# Patient Record
Sex: Male | Born: 1935 | Race: White | Hispanic: No | Marital: Married | Smoking: Former smoker
Health system: Southern US, Community
[De-identification: ages and names within clinical notes are randomized; demographics above are authoritative.]

## PROBLEM LIST (undated history)

## (undated) DIAGNOSIS — R569 Unspecified convulsions: Secondary | ICD-10-CM

## (undated) DIAGNOSIS — M5481 Occipital neuralgia: Secondary | ICD-10-CM

## (undated) DIAGNOSIS — I639 Cerebral infarction, unspecified: Secondary | ICD-10-CM

## (undated) DIAGNOSIS — M19012 Primary osteoarthritis, left shoulder: Secondary | ICD-10-CM

## (undated) DIAGNOSIS — R519 Headache, unspecified: Secondary | ICD-10-CM

## (undated) DIAGNOSIS — M1612 Unilateral primary osteoarthritis, left hip: Secondary | ICD-10-CM

## (undated) DIAGNOSIS — I499 Cardiac arrhythmia, unspecified: Secondary | ICD-10-CM

## (undated) DIAGNOSIS — Z9289 Personal history of other medical treatment: Secondary | ICD-10-CM

## (undated) DIAGNOSIS — I2510 Atherosclerotic heart disease of native coronary artery without angina pectoris: Secondary | ICD-10-CM

## (undated) DIAGNOSIS — D472 Monoclonal gammopathy: Secondary | ICD-10-CM

## (undated) DIAGNOSIS — G629 Polyneuropathy, unspecified: Secondary | ICD-10-CM

## (undated) DIAGNOSIS — Z9889 Other specified postprocedural states: Secondary | ICD-10-CM

## (undated) DIAGNOSIS — M4306 Spondylolysis, lumbar region: Secondary | ICD-10-CM

## (undated) DIAGNOSIS — C4490 Unspecified malignant neoplasm of skin, unspecified: Secondary | ICD-10-CM

## (undated) DIAGNOSIS — K8020 Calculus of gallbladder without cholecystitis without obstruction: Secondary | ICD-10-CM

## (undated) DIAGNOSIS — M1990 Unspecified osteoarthritis, unspecified site: Secondary | ICD-10-CM

## (undated) DIAGNOSIS — G5620 Lesion of ulnar nerve, unspecified upper limb: Secondary | ICD-10-CM

## (undated) DIAGNOSIS — R51 Headache: Secondary | ICD-10-CM

## (undated) DIAGNOSIS — K625 Hemorrhage of anus and rectum: Secondary | ICD-10-CM

## (undated) DIAGNOSIS — J189 Pneumonia, unspecified organism: Secondary | ICD-10-CM

## (undated) DIAGNOSIS — G4730 Sleep apnea, unspecified: Secondary | ICD-10-CM

## (undated) DIAGNOSIS — M48062 Spinal stenosis, lumbar region with neurogenic claudication: Secondary | ICD-10-CM

## (undated) DIAGNOSIS — G5622 Lesion of ulnar nerve, left upper limb: Secondary | ICD-10-CM

## (undated) DIAGNOSIS — M19011 Primary osteoarthritis, right shoulder: Secondary | ICD-10-CM

## (undated) DIAGNOSIS — I10 Essential (primary) hypertension: Secondary | ICD-10-CM

## (undated) DIAGNOSIS — G459 Transient cerebral ischemic attack, unspecified: Secondary | ICD-10-CM

## (undated) DIAGNOSIS — R112 Nausea with vomiting, unspecified: Secondary | ICD-10-CM

## (undated) HISTORY — PX: EYE SURGERY: SHX253

## (undated) HISTORY — PX: ULNAR NERVE TRANSPOSITION: SHX2595

## (undated) HISTORY — PX: TONSILLECTOMY AND ADENOIDECTOMY: SUR1326

## (undated) HISTORY — DX: Lesion of ulnar nerve, left upper limb: G56.22

## (undated) HISTORY — DX: Spondylolysis, lumbar region: M43.06

## (undated) HISTORY — DX: Personal history of other medical treatment: Z92.89

## (undated) HISTORY — DX: Occipital neuralgia: M54.81

## (undated) HISTORY — DX: Polyneuropathy, unspecified: G62.9

## (undated) HISTORY — PX: BACK SURGERY: SHX140

## (undated) HISTORY — DX: Lesion of ulnar nerve, unspecified upper limb: G56.20

## (undated) HISTORY — PX: JOINT REPLACEMENT: SHX530

## (undated) HISTORY — PX: OTHER SURGICAL HISTORY: SHX169

## (undated) HISTORY — PX: CATARACT EXTRACTION: SUR2

## (undated) HISTORY — PX: TOE SURGERY: SHX1073

## (undated) HISTORY — DX: Monoclonal gammopathy: D47.2

## (undated) HISTORY — DX: Calculus of gallbladder without cholecystitis without obstruction: K80.20

## (undated) HISTORY — DX: Hemorrhage of anus and rectum: K62.5

---

## 1976-10-29 HISTORY — PX: GREAT TOE ARTHRODESIS, INTERPHALANGEAL JOINT: SUR55

## 1993-07-29 HISTORY — PX: SKIN CANCER EXCISION: SHX779

## 1995-10-30 DIAGNOSIS — Z9289 Personal history of other medical treatment: Secondary | ICD-10-CM

## 1995-10-30 HISTORY — DX: Personal history of other medical treatment: Z92.89

## 1996-07-29 DIAGNOSIS — K8020 Calculus of gallbladder without cholecystitis without obstruction: Secondary | ICD-10-CM

## 1996-07-29 HISTORY — DX: Calculus of gallbladder without cholecystitis without obstruction: K80.20

## 1998-05-29 HISTORY — PX: SUPERFICIAL KERATECTOMY: SHX2459

## 1999-11-28 ENCOUNTER — Encounter: Admission: RE | Admit: 1999-11-28 | Discharge: 1999-11-28 | Payer: Self-pay | Admitting: Sports Medicine

## 1999-12-28 HISTORY — PX: OTHER SURGICAL HISTORY: SHX169

## 2000-11-05 ENCOUNTER — Encounter: Admission: RE | Admit: 2000-11-05 | Discharge: 2000-11-05 | Payer: Self-pay | Admitting: Family Medicine

## 2000-11-21 ENCOUNTER — Encounter: Admission: RE | Admit: 2000-11-21 | Discharge: 2000-11-21 | Payer: Self-pay | Admitting: Family Medicine

## 2000-12-24 ENCOUNTER — Encounter: Admission: RE | Admit: 2000-12-24 | Discharge: 2000-12-24 | Payer: Self-pay | Admitting: Family Medicine

## 2001-06-24 ENCOUNTER — Encounter: Admission: RE | Admit: 2001-06-24 | Discharge: 2001-06-24 | Payer: Self-pay | Admitting: Family Medicine

## 2001-07-01 ENCOUNTER — Encounter: Admission: RE | Admit: 2001-07-01 | Discharge: 2001-07-01 | Payer: Self-pay | Admitting: Family Medicine

## 2001-12-30 ENCOUNTER — Encounter: Admission: RE | Admit: 2001-12-30 | Discharge: 2001-12-30 | Payer: Self-pay | Admitting: Family Medicine

## 2002-01-22 ENCOUNTER — Encounter: Admission: RE | Admit: 2002-01-22 | Discharge: 2002-01-22 | Payer: Self-pay | Admitting: Family Medicine

## 2002-05-20 ENCOUNTER — Ambulatory Visit (HOSPITAL_COMMUNITY): Admission: RE | Admit: 2002-05-20 | Discharge: 2002-05-20 | Payer: Self-pay | Admitting: Family Medicine

## 2002-05-20 ENCOUNTER — Encounter: Admission: RE | Admit: 2002-05-20 | Discharge: 2002-05-20 | Payer: Self-pay | Admitting: Family Medicine

## 2002-05-29 DIAGNOSIS — Z9289 Personal history of other medical treatment: Secondary | ICD-10-CM

## 2002-05-29 HISTORY — DX: Personal history of other medical treatment: Z92.89

## 2002-06-16 ENCOUNTER — Ambulatory Visit (HOSPITAL_COMMUNITY): Admission: RE | Admit: 2002-06-16 | Discharge: 2002-06-16 | Payer: Self-pay | Admitting: Sports Medicine

## 2002-07-07 ENCOUNTER — Encounter: Admission: RE | Admit: 2002-07-07 | Discharge: 2002-07-07 | Payer: Self-pay | Admitting: Family Medicine

## 2002-07-11 ENCOUNTER — Encounter: Payer: Self-pay | Admitting: Family Medicine

## 2002-07-11 ENCOUNTER — Encounter: Admission: RE | Admit: 2002-07-11 | Discharge: 2002-07-11 | Payer: Self-pay | Admitting: Family Medicine

## 2002-07-15 DIAGNOSIS — Z9289 Personal history of other medical treatment: Secondary | ICD-10-CM

## 2002-07-15 HISTORY — DX: Personal history of other medical treatment: Z92.89

## 2002-12-28 DIAGNOSIS — K625 Hemorrhage of anus and rectum: Secondary | ICD-10-CM

## 2002-12-28 HISTORY — DX: Hemorrhage of anus and rectum: K62.5

## 2003-01-26 ENCOUNTER — Encounter: Admission: RE | Admit: 2003-01-26 | Discharge: 2003-01-26 | Payer: Self-pay | Admitting: Family Medicine

## 2003-01-27 ENCOUNTER — Encounter: Admission: RE | Admit: 2003-01-27 | Discharge: 2003-01-27 | Payer: Self-pay | Admitting: Family Medicine

## 2003-04-15 ENCOUNTER — Ambulatory Visit (HOSPITAL_COMMUNITY): Admission: RE | Admit: 2003-04-15 | Discharge: 2003-04-15 | Payer: Self-pay | Admitting: Gastroenterology

## 2003-04-15 ENCOUNTER — Encounter (INDEPENDENT_AMBULATORY_CARE_PROVIDER_SITE_OTHER): Payer: Self-pay | Admitting: *Deleted

## 2003-04-19 HISTORY — PX: COLONOSCOPY W/ POLYPECTOMY: SHX1380

## 2004-03-14 ENCOUNTER — Encounter: Admission: RE | Admit: 2004-03-14 | Discharge: 2004-03-14 | Payer: Self-pay | Admitting: Family Medicine

## 2004-03-15 ENCOUNTER — Encounter: Admission: RE | Admit: 2004-03-15 | Discharge: 2004-03-15 | Payer: Self-pay | Admitting: Family Medicine

## 2004-04-05 ENCOUNTER — Encounter: Admission: RE | Admit: 2004-04-05 | Discharge: 2004-04-05 | Payer: Self-pay | Admitting: Family Medicine

## 2004-04-11 ENCOUNTER — Encounter: Admission: RE | Admit: 2004-04-11 | Discharge: 2004-04-11 | Payer: Self-pay | Admitting: Family Medicine

## 2004-07-17 ENCOUNTER — Emergency Department (HOSPITAL_COMMUNITY): Admission: EM | Admit: 2004-07-17 | Discharge: 2004-07-17 | Payer: Self-pay | Admitting: Family Medicine

## 2005-04-17 ENCOUNTER — Encounter: Admission: RE | Admit: 2005-04-17 | Discharge: 2005-04-17 | Payer: Self-pay | Admitting: Family Medicine

## 2005-04-17 ENCOUNTER — Ambulatory Visit: Payer: Self-pay | Admitting: Family Medicine

## 2005-05-08 ENCOUNTER — Ambulatory Visit: Payer: Self-pay | Admitting: Family Medicine

## 2005-05-22 ENCOUNTER — Ambulatory Visit: Payer: Self-pay | Admitting: Family Medicine

## 2005-07-03 ENCOUNTER — Ambulatory Visit: Payer: Self-pay | Admitting: Family Medicine

## 2006-01-03 ENCOUNTER — Emergency Department (HOSPITAL_COMMUNITY): Admission: EM | Admit: 2006-01-03 | Discharge: 2006-01-03 | Payer: Self-pay | Admitting: Family Medicine

## 2006-05-07 ENCOUNTER — Ambulatory Visit: Payer: Self-pay | Admitting: Family Medicine

## 2006-12-26 DIAGNOSIS — G609 Hereditary and idiopathic neuropathy, unspecified: Secondary | ICD-10-CM

## 2006-12-26 DIAGNOSIS — E785 Hyperlipidemia, unspecified: Secondary | ICD-10-CM | POA: Insufficient documentation

## 2006-12-26 DIAGNOSIS — F528 Other sexual dysfunction not due to a substance or known physiological condition: Secondary | ICD-10-CM | POA: Insufficient documentation

## 2006-12-26 DIAGNOSIS — G629 Polyneuropathy, unspecified: Secondary | ICD-10-CM | POA: Insufficient documentation

## 2006-12-26 DIAGNOSIS — M5382 Other specified dorsopathies, cervical region: Secondary | ICD-10-CM | POA: Insufficient documentation

## 2006-12-26 DIAGNOSIS — M1990 Unspecified osteoarthritis, unspecified site: Secondary | ICD-10-CM | POA: Insufficient documentation

## 2006-12-26 DIAGNOSIS — E669 Obesity, unspecified: Secondary | ICD-10-CM | POA: Insufficient documentation

## 2006-12-26 DIAGNOSIS — I10 Essential (primary) hypertension: Secondary | ICD-10-CM | POA: Insufficient documentation

## 2006-12-26 DIAGNOSIS — H9190 Unspecified hearing loss, unspecified ear: Secondary | ICD-10-CM | POA: Insufficient documentation

## 2007-04-21 ENCOUNTER — Encounter: Payer: Self-pay | Admitting: Family Medicine

## 2007-04-22 ENCOUNTER — Ambulatory Visit: Payer: Self-pay | Admitting: Family Medicine

## 2007-04-22 DIAGNOSIS — M546 Pain in thoracic spine: Secondary | ICD-10-CM | POA: Insufficient documentation

## 2007-04-22 DIAGNOSIS — M549 Dorsalgia, unspecified: Secondary | ICD-10-CM

## 2007-04-22 LAB — CONVERTED CEMR LAB
AST: 38 units/L — ABNORMAL HIGH (ref 0–37)
Albumin: 4.7 g/dL (ref 3.5–5.2)
Alkaline Phosphatase: 51 units/L (ref 39–117)
CO2: 27 meq/L (ref 19–32)
Calcium: 9.5 mg/dL (ref 8.4–10.5)
Creatinine, Ser: 0.89 mg/dL (ref 0.40–1.50)
Direct LDL: 123 mg/dL — ABNORMAL HIGH
Glucose, Urine, Semiquant: NEGATIVE
Ketones, urine, test strip: NEGATIVE
Nitrite: NEGATIVE
PSA: 0.33 ng/mL (ref 0.10–4.00)
Protein, U semiquant: NEGATIVE
Sodium: 140 meq/L (ref 135–145)
Specific Gravity, Urine: 1.02
Urobilinogen, UA: 0.2

## 2007-04-23 ENCOUNTER — Encounter: Payer: Self-pay | Admitting: Family Medicine

## 2007-04-24 ENCOUNTER — Encounter: Payer: Self-pay | Admitting: Family Medicine

## 2007-04-29 ENCOUNTER — Telehealth: Payer: Self-pay | Admitting: *Deleted

## 2007-04-30 ENCOUNTER — Ambulatory Visit: Payer: Self-pay | Admitting: Family Medicine

## 2007-04-30 ENCOUNTER — Telehealth (INDEPENDENT_AMBULATORY_CARE_PROVIDER_SITE_OTHER): Payer: Self-pay | Admitting: *Deleted

## 2007-11-11 ENCOUNTER — Telehealth: Payer: Self-pay | Admitting: *Deleted

## 2007-11-13 ENCOUNTER — Ambulatory Visit: Payer: Self-pay | Admitting: Family Medicine

## 2007-11-14 ENCOUNTER — Encounter: Payer: Self-pay | Admitting: Family Medicine

## 2007-11-14 DIAGNOSIS — E291 Testicular hypofunction: Secondary | ICD-10-CM | POA: Insufficient documentation

## 2007-11-14 LAB — CONVERTED CEMR LAB
ALT: 46 units/L (ref 0–53)
Albumin: 4.8 g/dL (ref 3.5–5.2)
BUN: 14 mg/dL (ref 6–23)
CO2: 27 meq/L (ref 19–32)
Calcium: 9.5 mg/dL (ref 8.4–10.5)
Chloride: 103 meq/L (ref 96–112)
Glucose, Bld: 99 mg/dL (ref 70–99)
HCT: 45.9 % (ref 39.0–52.0)
MCV: 99.1 fL (ref 78.0–100.0)
PSA: 0.42 ng/mL (ref 0.10–4.00)
RDW: 13.7 % (ref 11.5–15.5)
Testosterone: 208.1 ng/dL — ABNORMAL LOW (ref 350–890)
Total Bilirubin: 0.6 mg/dL (ref 0.3–1.2)
WBC: 6 10*3/uL (ref 4.0–10.5)

## 2007-11-19 ENCOUNTER — Telehealth: Payer: Self-pay | Admitting: *Deleted

## 2007-12-02 ENCOUNTER — Telehealth: Payer: Self-pay | Admitting: *Deleted

## 2007-12-03 ENCOUNTER — Ambulatory Visit: Payer: Self-pay | Admitting: Family Medicine

## 2007-12-03 DIAGNOSIS — H1045 Other chronic allergic conjunctivitis: Secondary | ICD-10-CM | POA: Insufficient documentation

## 2008-01-02 ENCOUNTER — Encounter: Payer: Self-pay | Admitting: Family Medicine

## 2008-01-02 ENCOUNTER — Ambulatory Visit: Payer: Self-pay | Admitting: Family Medicine

## 2008-01-02 ENCOUNTER — Telehealth: Payer: Self-pay | Admitting: *Deleted

## 2008-05-04 ENCOUNTER — Ambulatory Visit: Payer: Self-pay | Admitting: Family Medicine

## 2008-05-04 LAB — CONVERTED CEMR LAB
CO2: 25 meq/L (ref 19–32)
Cholesterol, target level: 200 mg/dL
Creatinine, Ser: 0.9 mg/dL (ref 0.40–1.50)
Glucose, Bld: 107 mg/dL — ABNORMAL HIGH (ref 70–99)
HDL goal, serum: 40 mg/dL
Sodium: 140 meq/L (ref 135–145)

## 2008-05-06 ENCOUNTER — Encounter: Payer: Self-pay | Admitting: Family Medicine

## 2008-06-07 ENCOUNTER — Telehealth: Payer: Self-pay | Admitting: Family Medicine

## 2008-06-10 ENCOUNTER — Encounter: Payer: Self-pay | Admitting: Family Medicine

## 2008-08-06 ENCOUNTER — Encounter: Payer: Self-pay | Admitting: Family Medicine

## 2008-08-06 ENCOUNTER — Ambulatory Visit: Payer: Self-pay | Admitting: Family Medicine

## 2008-08-06 ENCOUNTER — Telehealth: Payer: Self-pay | Admitting: *Deleted

## 2008-08-06 LAB — CONVERTED CEMR LAB
Basophils Absolute: 0 10*3/uL (ref 0.0–0.1)
Bilirubin Urine: NEGATIVE
Blood in Urine, dipstick: NEGATIVE
Eosinophils Absolute: 0.1 10*3/uL (ref 0.0–0.7)
Hemoglobin: 14.5 g/dL (ref 13.0–17.0)
Lymphocytes Relative: 24 % (ref 12–46)
Lymphs Abs: 2.1 10*3/uL (ref 0.7–4.0)
MCV: 94.9 fL (ref 78.0–100.0)
Monocytes Absolute: 0.6 10*3/uL (ref 0.1–1.0)
Monocytes Relative: 7 % (ref 3–12)
Neutro Abs: 6 10*3/uL (ref 1.7–7.7)
Neutrophils Relative %: 67 % (ref 43–77)
Specific Gravity, Urine: 1.015
Urobilinogen, UA: 0.2
WBC Urine, dipstick: NEGATIVE
WBC: 8.9 10*3/uL (ref 4.0–10.5)
pH: 7.5

## 2008-12-07 ENCOUNTER — Ambulatory Visit: Payer: Self-pay | Admitting: Family Medicine

## 2008-12-14 ENCOUNTER — Ambulatory Visit: Payer: Self-pay | Admitting: Family Medicine

## 2008-12-14 ENCOUNTER — Encounter: Payer: Self-pay | Admitting: Family Medicine

## 2008-12-14 DIAGNOSIS — N400 Enlarged prostate without lower urinary tract symptoms: Secondary | ICD-10-CM | POA: Insufficient documentation

## 2008-12-14 LAB — CONVERTED CEMR LAB
AST: 30 units/L (ref 0–37)
Cholesterol: 198 mg/dL (ref 0–200)
Creatinine, Ser: 0.83 mg/dL (ref 0.40–1.50)
HDL: 41 mg/dL (ref >39)
Potassium: 3.9 meq/L (ref 3.5–5.3)
Sodium: 141 meq/L (ref 135–145)
Testosterone: 159.88 ng/dL — ABNORMAL LOW (ref 350–890)
Total Bilirubin: 0.5 mg/dL (ref 0.3–1.2)
Total Protein: 7.2 g/dL (ref 6.0–8.3)
VLDL: 50 mg/dL — ABNORMAL HIGH (ref 0–40)

## 2008-12-21 ENCOUNTER — Ambulatory Visit: Payer: Self-pay | Admitting: Family Medicine

## 2008-12-30 ENCOUNTER — Ambulatory Visit: Payer: Self-pay | Admitting: Family Medicine

## 2009-07-07 ENCOUNTER — Ambulatory Visit: Payer: Self-pay | Admitting: Family Medicine

## 2009-07-29 ENCOUNTER — Emergency Department (HOSPITAL_COMMUNITY): Admission: EM | Admit: 2009-07-29 | Discharge: 2009-07-29 | Payer: Self-pay | Admitting: Emergency Medicine

## 2009-10-04 ENCOUNTER — Ambulatory Visit: Payer: Self-pay | Admitting: Family Medicine

## 2009-10-04 ENCOUNTER — Encounter: Payer: Self-pay | Admitting: Family Medicine

## 2009-10-04 DIAGNOSIS — M109 Gout, unspecified: Secondary | ICD-10-CM | POA: Insufficient documentation

## 2009-10-14 LAB — CONVERTED CEMR LAB: Uric Acid, Serum: 8.2 mg/dL — ABNORMAL HIGH (ref 4.0–7.8)

## 2009-12-27 ENCOUNTER — Ambulatory Visit: Payer: Self-pay | Admitting: Family Medicine

## 2009-12-27 DIAGNOSIS — IMO0001 Reserved for inherently not codable concepts without codable children: Secondary | ICD-10-CM | POA: Insufficient documentation

## 2010-01-16 ENCOUNTER — Ambulatory Visit: Payer: Self-pay | Admitting: Family Medicine

## 2010-01-16 ENCOUNTER — Encounter: Payer: Self-pay | Admitting: Family Medicine

## 2010-01-16 LAB — CONVERTED CEMR LAB
Albumin: 4.7 g/dL (ref 3.5–5.2)
Alkaline Phosphatase: 45 units/L (ref 39–117)
BUN: 14 mg/dL (ref 6–23)
Cholesterol: 153 mg/dL (ref 0–200)
HDL: 35 mg/dL — ABNORMAL LOW (ref >39)
LDL Cholesterol: 78 mg/dL (ref 0–99)
Total CK: 356 units/L — ABNORMAL HIGH (ref 7–232)
Total Protein: 7 g/dL (ref 6.0–8.3)
VLDL: 40 mg/dL (ref 0–40)

## 2010-01-17 ENCOUNTER — Encounter: Payer: Self-pay | Admitting: Family Medicine

## 2010-01-18 ENCOUNTER — Telehealth: Payer: Self-pay | Admitting: Family Medicine

## 2010-01-31 ENCOUNTER — Encounter: Payer: Self-pay | Admitting: Family Medicine

## 2010-02-07 ENCOUNTER — Encounter: Payer: Self-pay | Admitting: Family Medicine

## 2010-02-20 ENCOUNTER — Encounter: Payer: Self-pay | Admitting: Family Medicine

## 2010-03-15 ENCOUNTER — Ambulatory Visit: Payer: Self-pay | Admitting: Family Medicine

## 2010-03-20 ENCOUNTER — Ambulatory Visit: Payer: Self-pay | Admitting: Family Medicine

## 2010-05-31 ENCOUNTER — Encounter: Payer: Self-pay | Admitting: Family Medicine

## 2010-06-13 ENCOUNTER — Ambulatory Visit: Payer: Self-pay | Admitting: Oncology

## 2010-06-16 ENCOUNTER — Encounter: Payer: Self-pay | Admitting: Family Medicine

## 2010-06-16 LAB — CBC WITH DIFFERENTIAL/PLATELET
Basophils Absolute: 0 10*3/uL (ref 0.0–0.1)
EOS%: 2 % (ref 0.0–7.0)
HCT: 40.8 % (ref 38.4–49.9)
MONO#: 0.4 10*3/uL (ref 0.1–0.9)
MONO%: 7.4 % (ref 0.0–14.0)
WBC: 5.3 10*3/uL (ref 4.0–10.3)
lymph#: 1.9 10*3/uL (ref 0.9–3.3)

## 2010-06-20 ENCOUNTER — Ambulatory Visit (HOSPITAL_COMMUNITY): Admission: RE | Admit: 2010-06-20 | Discharge: 2010-06-20 | Payer: Self-pay | Admitting: Oncology

## 2010-06-20 LAB — COMPREHENSIVE METABOLIC PANEL
ALT: 36 U/L (ref 0–53)
AST: 35 U/L (ref 0–37)
Albumin: 4.6 g/dL (ref 3.5–5.2)
CO2: 28 mEq/L (ref 19–32)
Creatinine, Ser: 0.86 mg/dL (ref 0.40–1.50)
Glucose, Bld: 130 mg/dL — ABNORMAL HIGH (ref 70–99)
Total Bilirubin: 0.7 mg/dL (ref 0.3–1.2)
Total Protein: 7.2 g/dL (ref 6.0–8.3)

## 2010-06-20 LAB — KAPPA/LAMBDA LIGHT CHAINS
Kappa:Lambda Ratio: 0.81 (ref 0.26–1.65)
Lambda Free Lght Chn: 1.5 mg/dL (ref 0.57–2.63)

## 2010-06-23 LAB — UIFE/LIGHT CHAINS/TP QN, 24-HR UR
Albumin, U: DETECTED
Alpha 1, Urine: DETECTED — AB
Free Lambda Lt Chains,Ur: 0.09 mg/dL (ref 0.08–1.01)
Free Lt Chn Excr Rate: 50.88 mg/d
Time: 24 hours
Total Protein, Urine-Ur/day: 89 mg/d (ref 10–140)
Total Protein, Urine: 3.7 mg/dL
Volume, Urine: 2400 mL

## 2010-08-03 ENCOUNTER — Ambulatory Visit: Payer: Self-pay | Admitting: Family Medicine

## 2010-08-03 DIAGNOSIS — B353 Tinea pedis: Secondary | ICD-10-CM | POA: Insufficient documentation

## 2010-08-03 LAB — CONVERTED CEMR LAB: Direct LDL: 120 mg/dL — ABNORMAL HIGH

## 2010-08-04 ENCOUNTER — Encounter: Payer: Self-pay | Admitting: Family Medicine

## 2010-08-18 ENCOUNTER — Encounter: Payer: Self-pay | Admitting: Family Medicine

## 2010-08-30 ENCOUNTER — Telehealth: Payer: Self-pay | Admitting: Family Medicine

## 2010-09-03 ENCOUNTER — Encounter: Payer: Self-pay | Admitting: Family Medicine

## 2010-09-03 DIAGNOSIS — R748 Abnormal levels of other serum enzymes: Secondary | ICD-10-CM | POA: Insufficient documentation

## 2010-10-04 ENCOUNTER — Ambulatory Visit: Payer: Self-pay | Admitting: Oncology

## 2010-10-06 LAB — CBC WITH DIFFERENTIAL/PLATELET
Basophils Absolute: 0.1 10*3/uL (ref 0.0–0.1)
HCT: 41.5 % (ref 38.4–49.9)
LYMPH%: 34.2 % (ref 14.0–49.0)
MCH: 31.9 pg (ref 27.2–33.4)
MONO%: 6.5 % (ref 0.0–14.0)
NEUT#: 3.5 10*3/uL (ref 1.5–6.5)
NEUT%: 55.7 % (ref 39.0–75.0)
RBC: 4.45 10*6/uL (ref 4.20–5.82)
WBC: 6.3 10*3/uL (ref 4.0–10.3)
lymph#: 2.2 10*3/uL (ref 0.9–3.3)

## 2010-10-10 LAB — COMPREHENSIVE METABOLIC PANEL
Albumin: 4.6 g/dL (ref 3.5–5.2)
Alkaline Phosphatase: 52 U/L (ref 39–117)
BUN: 15 mg/dL (ref 6–23)
CO2: 23 mEq/L (ref 19–32)
Chloride: 102 mEq/L (ref 96–112)
Creatinine, Ser: 1.02 mg/dL (ref 0.40–1.50)
Potassium: 3.8 mEq/L (ref 3.5–5.3)
Total Bilirubin: 0.5 mg/dL (ref 0.3–1.2)

## 2010-10-10 LAB — SPEP & IFE WITH QIG
Albumin ELP: 61.1 % (ref 55.8–66.1)
Alpha-2-Globulin: 11.7 % (ref 7.1–11.8)
Beta 2: 3.4 % (ref 3.2–6.5)
Beta Globulin: 5.6 % (ref 4.7–7.2)
Gamma Globulin: 14.3 % (ref 11.1–18.8)
IgA: 106 mg/dL (ref 68–378)
IgM, Serum: 161 mg/dL (ref 60–263)

## 2010-10-10 LAB — KAPPA/LAMBDA LIGHT CHAINS: Kappa:Lambda Ratio: 0.79 (ref 0.26–1.65)

## 2010-10-10 LAB — LACTATE DEHYDROGENASE: LDH: 175 U/L (ref 94–250)

## 2010-10-13 ENCOUNTER — Encounter: Payer: Self-pay | Admitting: Family Medicine

## 2010-11-09 ENCOUNTER — Ambulatory Visit
Admission: RE | Admit: 2010-11-09 | Discharge: 2010-11-09 | Payer: Self-pay | Source: Home / Self Care | Attending: Family Medicine | Admitting: Family Medicine

## 2010-11-09 DIAGNOSIS — L821 Other seborrheic keratosis: Secondary | ICD-10-CM | POA: Insufficient documentation

## 2010-11-28 NOTE — Miscellaneous (Signed)
Summary: needs lab  Clinical Lists Changes  Misty Stanley from Dr. Titus Dubin [phone])states we need to add myocitis accessor panel to the lab we drew yesterday. wants [place] to call the lab & have this done to blood sample. states the lab holds blood for 7 days. sent to lab staff.Golden Circle RN  August 04, 2010 3:45 PM  Unable to add Myocitis Accessor Panel.  QNS for add on.  Misty Stanley at Dr. Berenice Bouton office notified. Terese Door  August 04, 2010 4:00 PM

## 2010-11-28 NOTE — Miscellaneous (Signed)
Summary: Re: Cialis RX  Clinical Lists Changes   patient states the pharmacy will only allow him 6 Cialis per his insurance. this quanity is no matter what the dosage is, so he would like to go back to the 20 mg tabs instead of the 5 mg. will forward message to MD. Theresia Lo RN  January 31, 2010 9:28 AM   Prescriptions: CIALIS 20 MG TABS (TADALAFIL) Take one dially as needed  #6 x 11   Entered and Authorized by:   Zachery Dauer MD   Signed by:   Zachery Dauer MD on 01/31/2010   Method used:   Electronically to        [facility] [address] [address] (retail)       [address] [place], [place]  [address]       Ph: [phone] or [phone]       Fax: [phone]   RxID:   [id number]

## 2010-11-28 NOTE — Assessment & Plan Note (Signed)
Summary: hot flashes,df   Vital Signs:  Patient profile:   75 year old male Height:      73 inches Weight:      233.3 pounds BMI:     30.89 Temp:     98.2 degrees F oral Pulse rate:   60 / minute Pulse rhythm:   regular BP sitting:   127 / 84  (left arm)  Vitals Entered By: Loralee Pacas CMA (August 03, 2010 8:47 AM)  CC: hot flashes   Primary Care Provider:  Zachery Dauer MD  CC:  hot flashes.  History of Present Illness: Missed his patches for 3 days and had hot flashes (no fever or chills) which resolved a few days after restarting it. He was concerned this could be an indication of a serious problem.   The neurologist confirmed peripheral neuropathy up to his mid calves, but didn't recommend a treatmemnt. It is only numb with no discomfort otherwise. No foot injuries.   He will go back to Dr Monica Martinez..., rheumatologist to follow-up on the CK elevation. She had him stop the Red Yeast Rice, but he doesn't feel like there has been a decrease in his chronic calf tenderness. He faxed me the lab test she scheduled for 3 months from Aug 3 which is only a CK. She had him switch to a chondroitin free glucosamine. 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   Had used creams for tinea pedis in the past but it hasn't been bothering him recently.   No dysuria, has nocturia x 1.   Pimple on his left chest for a month that won't drain, but isn't sore.   He and wife go to the gym 3 days weekly where he does 20 minutes on the treadmill then some weight lifting.   Habits & Providers  Alcohol-Tobacco-Diet     Alcohol drinks/day: 0     Tobacco Status: quit     Tobacco Counseling: to quit use of tobacco products     Year Quit: 1969  Allergies: 1)  Tetracycline Hcl (Tetracycline Hcl)  Physical Exam  General:  well-developed and overweight-appearing.   Lungs:  Normal respiratory effort, chest expands symmetrically. Lungs are clear to auscultation, no crackles or  wheezes. Heart:  Normal rate and regular rhythm. S1 and S2 normal without gallop, murmur, click, rub or other extra sounds. Extremities:  No clubbing, cyanosis, edema, or deformity noted with normal full range of motion of all joints.   Neurologic:  Can't feel filament below mid calves Skin:  Tinea pedis causing mainly desquamation plantar surface and between toes. Tinea unguinum distally Psych:  Oriented X3, memory intact for recent and remote, normally interactive, good eye contact, and not depressed appearing.     Impression & Recommendations:  Problem # 1:  HYPERTENSION, BENIGN SYSTEMIC (ICD-401.1) Assessment Improved  His updated medication list for this problem includes:    Amlodipine Besylate 10 Mg Tabs (Amlodipine besylate) .Marland Kitchen... Take 1 tablet by mouth once a day  Orders: [facility]- Est  Level 4 ([address])  Problem # 2:  NEUROPATHY, PERIPHERAL (ICD-356.9) Assessment: Deteriorated instructed to inspect feet daily  Problem # 3:  OBESITY, NOS (ICD-278.00) He will increase his exercise. You will increase your gym days to 4 weekly and walking time to 30 minutes.   Problem # 4:  DERMATOPHYTOSIS OF FOOT (ICD-110.4)  Use over the counter myconazole power or cream chronically  Orders: KOH-[facility] ([address])  Problem # 5:  TESTOSTERONE DEFICIENCY (ICD-257.2)  Hot flashes resolved with reinstitution of the gel Orders: [facility]- Est  Level 4 ([address])  Problem # 6:  HYPERTROPHY PROSTATE W/O UR OBST & OTH LUTS (ICD-600.00) Nocturia x 1.  Orders: PSA-[facility] ([id number]) Direct LDL-[facility] [phone])  Problem # 7:  HYPERLIPIDEMIA (ICD-272.4) Recommended he use a statin rather than red yeast rice.  Orders: Direct LDL-[facility] ([address]-13244) [facility]- Est  Level 4 ([address])  Complete Medication List: 1)  Amlodipine Besylate 10 Mg Tabs (Amlodipine besylate) .... Take 1 tablet by mouth once a day 2)  Cialis 20 Mg Tabs (Tadalafil) .... Take one dially as needed 3)  Androderm 5 Mg/24hr Pt24 (Testosterone) .... Apply  one daily 4)  Cvs Aspirin Ec 81 Mg Tbec (Aspirin) .... Take one tablet daily 5)  Pain Relief 650 Mg Tbcr (Acetaminophen) .... Takes 2 tabs at bedtime 6)  Centrum Silver Tabs (Multiple vitamins-minerals) .... Take one tablet daily 7)  Icaps Areds Formula Tabs (Multiple vitamins-minerals) .... Take one tablet twice daily 8)  Proair Hfa 108 (90 Base) Mcg/act Aers (Albuterol sulfate) .... 2 puffs inhaled every 4 hours as needed for shortness of breath or wheezing 9)  Glucosamine-msm 500-400 Mg Caps (Glucosamine sulfate-msm) .... Take 2 tabs daily 10)  Fish Oil Concentrate 1000 Mg Caps (Omega-3 fatty acids)  Other Orders: Influenza Vaccine MCR (25366) Creatinine-[facility] (44034-74259) CK (Creatine Kinase)-[facility] ([address]387-56433)  Patient Instructions: 1)  You will increase your gym days to 4 weekly and walking time to 30 minutes.  2)  Miconazole powder or cream two times a day for foot fungus.   Immunizations Administered:  Influenza Vaccine # 1:    Vaccine Type: Fluvax MCR    Site: right deltoid    Mfr: GlaxoSmithKline    Dose: 0.5 ml    Route: IM    Given by: Loralee Pacas CMA    Exp. Date: 04/25/2011    Lot #: [id number]    VIS given: 05/23/10 version given August 03, 2010.  Flu Vaccine Consent Questions:    Do you have a history of severe allergic reactions to this vaccine? no    Any prior history of allergic reactions to egg and/or gelatin? no    Do you have a sensitivity to the preservative Thimersol? no    Do you have a past history of Guillan-Barre Syndrome? no    Do you currently have an acute febrile illness? no    Have you ever had a severe reaction to latex? no    Vaccine information given and explained to patient? yes   Laboratory Results  Date/Time Received: August 03, 2010 9:43 AM  Date/Time Reported: August 03, 2010 11:00 AM   Other Tests  Skin KOH: Positive Comments: ...............test performed by......Marland KitchenBonnie A. [place], MLS (ASCP)cm      Prevention &  Chronic Care Immunizations   Influenza vaccine: Fluvax MCR  (08/03/2010)   Influenza vaccine due: 08/06/2009    Tetanus booster: 10/30/1999: Done.   Tetanus booster due: 10/29/2009    Pneumococcal vaccine: Done.  (07/29/1996)   Pneumococcal vaccine deferral: Not indicated  (07/07/2009)   Pneumococcal vaccine due: None    H. zoster vaccine: Not documented  Colorectal Screening   Hemoccult: Done.  (04/28/2006)   Hemoccult due: Not Indicated    Colonoscopy: Done.  (05/29/2006)   Colonoscopy due: 05/29/2016  Other Screening   PSA: 0.28  (12/14/2008)   PSA ordered.   PSA due due: 05/04/2009   Smoking status: quit  (08/03/2010)  Lipids   Total Cholesterol: 153  (  01/16/2010)   LDL: 78  (01/16/2010)   LDL Direct: 139  (05/04/2008)   HDL: 35  (01/16/2010)   Triglycerides: 200  (01/16/2010)   Lipid panel due: 12/14/2009    SGOT (AST): 30  (01/16/2010)   SGPT (ALT): 30  (01/16/2010)   Alkaline phosphatase: 45  (01/16/2010)   Total bilirubin: 0.6  (01/16/2010)  Hypertension   Last Blood Pressure: 127 / 84  (08/03/2010)   Serum creatinine: 0.94  (01/16/2010)   Serum potassium 4.1  (01/16/2010)   Basic metabolic panel due: 01/04/2010    Hypertension flowsheet reviewed?: Yes   Progress toward BP goal: At goal  Self-Management Support :   Personal Goals (by the next clinic visit) :      Personal blood pressure goal: 140/90  (07/07/2009)     Personal LDL goal: 130  (07/07/2009)    Hypertension self-management support: Not documented    Lipid self-management support: Not documented    Appended Document: medicine details,df     Clinical Lists Changes  Medications: Changed medication from GLUCOSAMINE-MSM 500-400 MG CAPS (GLUCOSAMINE SULFATE-MSM) Take 2 tabs daily to GLUCOSAMINE-MSM 1500-500 MG/30ML LIQD (GLUCOSAMINE HCL-MSM) Take 2 tabs daily Changed medication from FISH OIL CONCENTRATE 1000 MG CAPS (OMEGA-3 FATTY ACIDS) to FISH OIL CONCENTRATE 1000 MG CAPS (OMEGA-3  FATTY ACIDS) Take 2 caps daily

## 2010-11-28 NOTE — Assessment & Plan Note (Signed)
Summary: Problem review

## 2010-11-28 NOTE — Miscellaneous (Signed)
Summary: neurology consult  Clinical Lists Changes Seen by Jamelle Rushing [facility] for 10 years of progressive peripheral neuropathy. Labs ordered. EMG/NCS ordered. MRI of spine considered. ANA with reflex then ordered

## 2010-11-28 NOTE — Progress Notes (Signed)
Summary: Rx Req Androderm printed and signed  Phone Note Refill Request Call back at Home Phone [phone] Message from:  Patient  Refills Requested: Medication #1:  ANDRODERM 5 MG/24HR  PT24 Apply one daily PT USES [facility] [address] [facility] [place].  Initial call taken by: Clydell Hakim,  January 18, 2010 2:39 PM    Prescriptions: ANDRODERM 5 MG/24HR  PT24 (TESTOSTERONE) Apply one daily  #30 x 0   Entered and Authorized by:   Zachery Dauer MD   Signed by:   Zachery Dauer MD on 01/18/2010   Method used:   Print then Give to Patient   RxID:   [id number]   Appended Document: Rx Req Androderm printed and signed Form faxed back to the pharmacy changing refills to 11

## 2010-11-28 NOTE — Consult Note (Signed)
Summary: [facility] CK, troch bursitis  [facility]   Imported By: Knox Royalty 09/01/2010 09:44:45  _____________________________________________________________________  External Attachment:    Type:   Image     Comment:   External Document

## 2010-11-28 NOTE — Consult Note (Signed)
Summary: [facility]  [facility]   Imported By: De Nurse 07/11/2010 11:35:10  _____________________________________________________________________  External Attachment:    Type:   Image     Comment:   External Document

## 2010-11-28 NOTE — Assessment & Plan Note (Signed)
Summary: f/up,tcb   Vital Signs:  Patient profile:   75 year old male Height:      73 inches Weight:      240 pounds Pulse rate:   63 / minute BP sitting:   122 / 78  (right arm)  Vitals Entered By: Renato Battles slade,cma CC: f/up HTN and check uric acid Is Patient Diabetic? No Pain Assessment Patient in pain? no        Primary Care Provider:  Zachery Dauer MD  CC:  f/up HTN and check uric acid.  History of Present Illness: No gout symptoms off HCTZ.   status post fracture of both ankles, so they get sore at times.  Calves have been inexplicably sore since childhood. Feet are numb "like walking in socks' when feet are bare. Feet continue to feel numb.   blood pressures at home haven't been above 150/90. Since wife retired in Jan they have joined a fitness center. Doing cardio and weight machines 3 x weekly  Habits & Providers  Alcohol-Tobacco-Diet     Tobacco Status: quit     Year Quit05-23-1969  Current Medications (verified): 1)  Amlodipine Besylate 10 Mg Tabs (Amlodipine Besylate) .... Take 1 Tablet By Mouth Once A Day 2)  Cialis 20 Mg  Tabs (Tadalafil) .... Take One Tablet Every 24 Hours As Needed 3)  Androderm 5 Mg/24hr  Pt24 (Testosterone) .... Apply One Daily 4)  Cvs Aspirin Ec 81 Mg  Tbec (Aspirin) .... Take One Tablet Daily 5)  Pain Relief 650 Mg  Tbcr (Acetaminophen) .... Takes 2 Tabs At Bedtime 6)  Triple Flex 500-400-125 Mg  Tabs (Glucosamine-Chondroitin-Msm) .... Takes 2 Tabs Daily 7)  Centrum Silver   Tabs (Multiple Vitamins-Minerals) .... Take One Tablet Daily 8)  Icaps Areds Formula  Tabs (Multiple Vitamins-Minerals) .... Take One Tablet Twice Daily 9)  Red Yeast Rice   Powd (Red Yeast Rice Extract) .... 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 Lungs are clear to auscultation, no crackles or wheezes. Heart:  Normal rate and regular rhythm. S1 and S2 normal without gallop, murmur, click, rub or other extra sounds. Abdomen:  Hypoactive bowel sounds, very tender with palpation of epigastrum, non-tender otherwise. Neurologic:  Can feel filament over entire feet Psych:  Oriented X3, memory intact for recent and remote, normally interactive, good eye contact, and not depressed appearing.     Impression & Recommendations:  Problem # 1:  MYALGIA (ICD-729.1) Will check CK and consider neuro check for this and neuropathy symptoms  His updated medication list for this problem includes:    Cvs Aspirin Ec 81 Mg Tbec (Aspirin) .Marland Kitchen... Take one tablet daily    Pain Relief 650 Mg Tbcr (Acetaminophen) .Marland Kitchen... Takes 2 tabs at bedtime  Problem # 2:  HYPERTENSION, BENIGN SYSTEMIC (ICD-401.1)  His updated medication list for this problem includes:    Amlodipine Besylate 10 Mg Tabs (Amlodipine besylate) .Marland Kitchen... Take 1 tablet by mouth once a day  Orders: [facility]- Est  Level  4 ([address])Future Orders: Comp Met-[facility] ([id number]) ... 12/28/2010  Problem # 3:  GOUT, UNSPECIFIED (ICD-274.9)  Orders: [facility]- Est  Level 4 ([address]) CK (Creatine Kinase)-[facility] ([address]-23250)Future Orders: Uric Acid-[facility] ([id number]) ... 12/28/2010  Problem # 4:  TESTOSTERONE DEFICIENCY (ICD-257.2) Continue patch  Problem # 5:  PLANTAR FASCIITIS, RIGHT (ICD-728.71) Assessment: Improved  Problem # 6:  ABDOMINAL PAIN, LOWER (ICD-789.09) Assessment: Improved  His updated medication  list for this problem includes:    Cvs Aspirin Ec 81 Mg Tbec (Aspirin) .Marland Kitchen... Take one tablet daily    Pain Relief 650 Mg Tbcr (Acetaminophen) .Marland Kitchen... Takes 2 tabs at bedtime  Problem # 7:  OBESITY, NOS (ICD-278.00)  Orders: [facility]- Est  Level 4 ([address])  Complete Medication List: 1)  Amlodipine Besylate 10 Mg Tabs (Amlodipine besylate) .... Take 1 tablet by mouth once a day 2)  Cialis 20 Mg Tabs (Tadalafil) .... Take one tablet every 24 hours as needed 3)  Androderm 5 Mg/24hr Pt24 (Testosterone) .... Apply one daily 4)  Cvs Aspirin Ec 81 Mg Tbec (Aspirin) .... Take one tablet daily 5)  Pain Relief 650 Mg Tbcr (Acetaminophen) .... Takes 2 tabs at bedtime 6)  Triple Flex 500-400-125 Mg Tabs (Glucosamine-chondroitin-msm) .... Takes 2 tabs daily 7)  Centrum Silver Tabs (Multiple vitamins-minerals) .... Take one tablet daily 8)  Icaps Areds Formula Tabs (Multiple vitamins-minerals) .... Take one tablet twice daily 9)  Red Yeast Rice Powd (Red yeast rice extract) .... Take two tablet twice daily  Other Orders: Future Orders: Lipid-[facility] ([id number]) ... 12/28/2010  Patient Instructions: 1)  Continue exercising 3 weeks (walkling 1 mile on track in 15 minutes, upper body machines x 30 minutes, then 30 minutes on the treadmill) and controlling portions. Training range is 90-120 2)  I will call with lab results if they are abnormal and arrange a neurology consult after gettilng them.  3)  Please schedule a follow-up appointment in 3 months .

## 2010-11-28 NOTE — Progress Notes (Signed)
Summary: refill Androderm  Phone Note Refill Request Call back at Home Phone [phone] Message from:  Patient  Refills Requested: Medication #1:  ANDRODERM 5 MG/24HR  PT24 Apply one daily [facility] [address] [phone] also needs to send a rx to [facility]  Initial call taken by: De Nurse,  August 30, 2010 8:45 AM    Prescriptions: Cathren Laine 5 MG/24HR  PT24 (TESTOSTERONE) Apply one daily  #90 x 3   Entered and Authorized by:   Zachery Dauer MD   Signed by:   Zachery Dauer MD on 08/30/2010   Method used:   Printed then faxed to ...       [facility] (mail-order)             , [place]         Ph: [phone]       Fax: [phone]   RxID:   [id number] ANDRODERM 5 MG/24HR  PT24 (TESTOSTERONE) Apply one daily  #30 x 5   Entered and Authorized by:   Zachery Dauer MD   Signed by:   Zachery Dauer MD on 08/30/2010   Method used:   Printed then faxed to ...       [facility] [id number] (retail)       [address]       [place], [place]  [address]       Ph: [phone] or [phone]       Fax: [phone]   RxID:   [id number]  Both placed in "Fax" box.

## 2010-11-28 NOTE — Assessment & Plan Note (Signed)
Summary: congestion/coughing,tcb   Vital Signs:  Patient profile:   75 year old male Height:      73 inches Weight:      227.5 pounds BMI:     30.12 O2 Sat:      94 % on Room air Temp:     98.6 degrees F oral Pulse rate:   87 / minute BP sitting:   132 / 79  (left arm) Cuff size:   regular  Vitals Entered By: Gladstone Pih (Mar 20, 2010 1:39 PM)  O2 Flow:  Room air CC: c/o congestion and cough no better Is Patient Diabetic? No Pain Assessment Patient in pain? no        Primary Care Provider:  Zachery Dauer MD  CC:  c/o congestion and cough no better.  History of Present Illness: Seem 5/18 and treated for asthmatic bronchitis, wih Z pack, albuterol MDI and 60 ccc cough syrup.  Using albuterol every 4 hours and it is helpful, after he ran out of cough syrup his cough has been severe, he has not been able to sleep.  He is draining large amounts of mucus from his sinuses.  Denies recent fever.  Habits & Providers  Alcohol-Tobacco-Diet     Tobacco Status: quit     Tobacco Counseling: to quit use of tobacco products     Year Quit: 1969  Current Medications (verified): 1)  Amlodipine Besylate 10 Mg Tabs (Amlodipine Besylate) .... Take 1 Tablet By Mouth Once A Day 2)  Cialis 20 Mg Tabs (Tadalafil) .... Take One Dially As Needed 3)  Androderm 5 Mg/24hr  Pt24 (Testosterone) .... Apply One Daily 4)  Cvs Aspirin Ec 81 Mg  Tbec (Aspirin) .... Take One Tablet Daily 5)  Pain Relief 650 Mg  Tbcr (Acetaminophen) .... Takes 2 Tabs At Bedtime 6)  Triple Flex 500-400-125 Mg  Tabs (Glucosamine-Chondroitin-Msm) .... Takes 2 Tabs Daily 7)  Centrum Silver   Tabs (Multiple Vitamins-Minerals) .... Take One Tablet Daily 8)  Icaps Areds Formula  Tabs (Multiple Vitamins-Minerals) .... Take One Tablet Twice Daily 9)  Red Yeast Rice   Powd (Red Yeast Rice Extract) .... Take Two Tablets Daily 10)  Proair Hfa 108 (90 Base) Mcg/act Aers (Albuterol Sulfate) .... 2 Puffs Inhaled Every 4 Hours As Needed  For Shortness of Breath or Wheezing 11)  Tussionex Pennkinetic Er 8-10 Mg/74ml Lqcr (Chlorpheniramine-Hydrocodone) .Marland Kitchen.. 1 Teaspoon By Mouth Two Times A Day For Cough.200 Cc 12)  Azithromycin 500 Mg Tabs (Azithromycin) .Marland Kitchen.. 1 Tab By Mouth Daily X 3 Days  Allergies (verified): 1)  Tetracycline Hcl (Tetracycline Hcl)  Social History: Smoking Status:  quit  Physical Exam  General:  Severe cough Ears:  TM retracted Mouth:  Thick post nasal drainage Lungs:  expiratory wheezing, severe cough with each deep breath Heart:  normal rate and regular rhythm.     Impression & Recommendations:  Problem # 1:  UPPER RESPIRATORY INFECTION, ACUTE, WITH BRONCHITIS (ICD-465.9)  refill narcotic cough syrup, #200 cc, no refills.  Continue use of MDI. His updated medication list for this problem includes:    Cvs Aspirin Ec 81 Mg Tbec (Aspirin) .Marland Kitchen... Take one tablet daily    Pain Relief 650 Mg Tbcr (Acetaminophen) .Marland Kitchen... Takes 2 tabs at bedtime    Tussionex Pennkinetic Er 8-10 Mg/49ml Lqcr (Chlorpheniramine-hydrocodone) .Marland Kitchen... 1 teaspoon by mouth two times a day for cough.200 cc  Orders: [facility]- Est Level  3 ([address])  Complete Medication List: 1)  Amlodipine Besylate 10 Mg Tabs (Amlodipine  besylate) .... Take 1 tablet by mouth once a day 2)  Cialis 20 Mg Tabs (Tadalafil) .... Take one dially as needed 3)  Androderm 5 Mg/24hr Pt24 (Testosterone) .... Apply one daily 4)  Cvs Aspirin Ec 81 Mg Tbec (Aspirin) .... Take one tablet daily 5)  Pain Relief 650 Mg Tbcr (Acetaminophen) .... Takes 2 tabs at bedtime 6)  Triple Flex 500-400-125 Mg Tabs (Glucosamine-chondroitin-msm) .... Takes 2 tabs daily 7)  Centrum Silver Tabs (Multiple vitamins-minerals) .... Take one tablet daily 8)  Icaps Areds Formula Tabs (Multiple vitamins-minerals) .... Take one tablet twice daily 9)  Red Yeast Rice Powd (Red yeast rice extract) .... Take two tablets daily 10)  Proair Hfa 108 (90 Base) Mcg/act Aers (Albuterol sulfate) .... 2  puffs inhaled every 4 hours as needed for shortness of breath or wheezing 11)  Tussionex Pennkinetic Er 8-10 Mg/6ml Lqcr (Chlorpheniramine-hydrocodone) .Marland Kitchen.. 1 teaspoon by mouth two times a day for cough.200 cc 12)  Azithromycin 500 Mg Tabs (Azithromycin) .Marland Kitchen.. 1 tab by mouth daily x 3 days  Other Orders: Pulse Oximetry- [facility] ([address])  Patient Instructions: 1)  fluids 2)  use narcotic cough medicine carefully, do not completely stop your cough. Prescriptions: TUSSIONEX PENNKINETIC ER 8-10 MG/5ML LQCR (CHLORPHENIRAMINE-HYDROCODONE) 1 teaspoon by mouth two times a day for cough.200 cc Brand medically necessary #1 x 0   Entered and Authorized by:   Luretha Murphy NP   Signed by:   Luretha Murphy NP on 03/20/2010   Method used:   Print then Give to Patient   RxID:   [id number]

## 2010-11-28 NOTE — Consult Note (Signed)
Summary: [facility] Rheum consult myopathy  [facility]   Imported By: De Nurse 06/15/2010 11:42:57  _____________________________________________________________________  External Attachment:    Type:   Image     Comment:   External Document

## 2010-11-28 NOTE — Letter (Signed)
Summary: Results Follow-up Letter  [facility]  [address]   [place], [place] [address]   Phone: [phone]  Fax: [phone]    01/17/2010  [address] [place], [place]  [address]  Dear Jose [name],   The following are the results of your recent test(s): Patient: Jose [name] Note: All result statuses are Final unless otherwise noted.  Tests: (1) Comprehensive Metabolic Panel ([address])   Sodium                    139 mEq/L                   135-145   Potassium                 4.1 mEq/L                   3.5-5.3   Chloride                  100 mEq/L                   96-112   CO2                       28 mEq/L                    19-32   Glucose              [H]  100 mg/dL                   40-10   BUN                       14 mg/dL                    2-72   Creatinine                0.94 mg/dL                  0.40-1.50   Bilirubin, Total          0.6 mg/dL                   5.3-6.6   Alkaline Phosphatase      45 U/L                      39-117   AST/SGOT                  30 U/L                      0-37   ALT/SGPT                  30 U/L                      0-53   Total Protein             7.0 g/dL                    4.4-0.3   Albumin                   4.7 g/dL  3.5-5.2   Calcium                   9.4 mg/dL                   0.4-54.0  Tests: (2) Lipid Profile (98119)   Cholesterol               153 mg/dL                   1-478     ATP III Classification:           < 200        mg/dL        Desirable          200 - 239     mg/dL        Borderline High          >= 240        mg/dL        High         Triglyceride         [H]  200 mg/dL                   <295   HDL Cholesterol      [L]  35 mg/dL                    >62   Total Chol/HDL Ratio      4.4 Ratio  VLDL Cholesterol (Calc)                             40 mg/dL                    1-30  LDL Cholesterol (Calc)                             78 mg/dL                    8-65         Total Cholesterol/HDL Ratio:CHD Risk                            Coronary Heart Disease Risk Table                                            Men       Women              1/2 Average Risk              3.4        3.3                  Average Risk              5.0        4.4              2 X Average Risk              9.6        7.1              3 X Average Risk  23.4       11.0     Use the calculated Patient Ratio above and the CHD Risk table      to determine the patient's CHD Risk.     ATP III Classification (LDL):           < 100        mg/dL         Optimal          100 - 129     mg/dL         Near or Above Optimal          130 - 159     mg/dL         Borderline High          160 - 189     mg/dL         High           > 190        mg/dL         Very High        Tests: (3) Uric Acid ([address])   Uric Acid                 7.6 mg/dL                   6.0-4.5  Tests: (4) CK, Total ([address])   CK, Total            [H]  356 U/L                     7-232  Sincerely,  Zachery Dauer MD [facility]           Appended Document: Results Follow-up phone call     Clinical Lists Changes  Medications: Changed medication from CIALIS 20 MG  TABS (TADALAFIL) take one tablet every 24 hours as needed to CIALIS 5 MG TABS (TADALAFIL) Take 1-2 tabs before relations - Signed Changed medication from RED YEAST RICE   POWD (RED YEAST RICE EXTRACT) Take two tablet twice daily to RED YEAST RICE   POWD (RED YEAST RICE EXTRACT) Take two tablets daily Rx of CIALIS 5 MG TABS (TADALAFIL) Take 1-2 tabs before relations;  #20 x 11;  Signed;  Entered by: Zachery Dauer MD;  Authorized by: Zachery Dauer MD;  Method used: Electronically to [facility] [address] [place], [place], [place]  [address], Ph: [phone] or [phone], Fax: [phone] Orders: Added new Referral order of Neurology Referral (Neuro) - Signed    Prescriptions: CIALIS 5 MG TABS  (TADALAFIL) Take 1-2 tabs before relations  #20 x 11   Entered and Authorized by:   Zachery Dauer MD   Signed by:   Zachery Dauer MD on 01/17/2010   Method used:   Electronically to        [facility] [address] (retail)       [address]       [place], [place]  [address]       Ph: [phone] or [phone]       Fax: [phone]   RxID:   [id number]    Phone Note Outgoing Call   Call placed by: Zachery Dauer MD,  January 17, 2010 11:22 AM Call placed to: Patient Summary of Call: He has been on vacation and hasn't  been doing vigorous exercise. No gout attacks, weakness, or generalized myalgias. Feet are still numb on bottoms.  I reported the mildly elevated CK and uric acid.   He will decrease the Red Yeast rice to one capsule two times a day since LDL is lower than needed and the natural statin could raise CK. I will refer him to neurology for the numb feet.  Initial call taken by: Zachery Dauer MD,  January 17, 2010 11:27 AM    New/Updated Medications: CIALIS 5 MG TABS (TADALAFIL) Take 1-2 tabs before relations RED YEAST RICE   POWD (RED YEAST RICE EXTRACT) Take two tablets daily  Appended Document: Results Follow-up Letter mailed

## 2010-11-28 NOTE — Assessment & Plan Note (Signed)
Summary: Androderm refill  Prescriptions: ANDRODERM 5 MG/24HR  PT24 (TESTOSTERONE) Apply one daily  #30 x 5   Entered and Authorized by:   Zachery Dauer MD   Signed by:   Zachery Dauer MD on 02/20/2010   Method used:   Printed then faxed to ...       [facility] [address] (retail)       [address] [place], [place]  [address]       Ph: [phone] or [phone]       Fax: [phone]   RxID:   [id number]

## 2010-11-28 NOTE — Assessment & Plan Note (Signed)
Summary: cough & congestion,tcb   Vital Signs:  Patient profile:   75 year old male Height:      73 inches Weight:      231 pounds BMI:     30.59 O2 Sat:      94 % Temp:     98 degrees F Pulse rate:   65 / minute BP sitting:   123 / 83  (left arm) Cuff size:   large  Vitals Entered By: Dennison Nancy RN (Mar 15, 2010 1:45 PM) CC: OV for cough and congestion Is Patient Diabetic? No Pain Assessment Patient in pain? no        Primary Care Provider:  Zachery Dauer MD  CC:  OV for cough and congestion.  History of Present Illness: 75 y/o Male with Cough, non productive, dypsnea with exertion.  He used to go to the gym 3x/week, but now he cannot work out on the treadmill, no weights.  Low grade fever x1 day last week, no chills.  Took niquil last week, which helped him sleep.  also using saline spray to sooth throat.  Feeling better this week compared to last week, but still feels he is sob.  COUGH Onset: 2 weeks Description:  Modifying factors:  cough worse when he lays down.  In the past Dr Sheffield Slider has prescribed expectorant + cough med  Symptoms Productive: no Wheezing: with exertion Dyspnea: with exertion Nasal discharge: yes, clear/gray Fever: one day last week Sore throat: yes Sick contacts: wife also sick, but after pt was sick first Heartburn symptoms:  no History of Asthma: bronchitic asthma per pt  Red Flags  Weight loss:  8 lbs in last 2 wks, because he didn't feel like eating, decrease appetite Hemoptysis: no Edema: no    Tobacoo: quit 1969  ***Cialis: Medicare and Tricare only pays for 6 tabs per month.  When he last saw Dr Sheffield Slider last, Dr Amada Jupiter gave him 5mg  tabs #30, but  Habits & Providers  Alcohol-Tobacco-Diet     Tobacco Status: never  Current Medications (verified): 1)  Amlodipine Besylate 10 Mg Tabs (Amlodipine Besylate) .... Take 1 Tablet By Mouth Once A Day 2)  Cialis 20 Mg Tabs (Tadalafil) .... Take One Dially As Needed 3)  Androderm 5 Mg/24hr   Pt24 (Testosterone) .... Apply One Daily 4)  Cvs Aspirin Ec 81 Mg  Tbec (Aspirin) .... Take One Tablet Daily 5)  Pain Relief 650 Mg  Tbcr (Acetaminophen) .... Takes 2 Tabs At Bedtime 6)  Triple Flex 500-400-125 Mg  Tabs (Glucosamine-Chondroitin-Msm) .... Takes 2 Tabs Daily 7)  Centrum Silver   Tabs (Multiple Vitamins-Minerals) .... Take One Tablet Daily 8)  Icaps Areds Formula  Tabs (Multiple Vitamins-Minerals) .... Take One Tablet Twice Daily 9)  Red Yeast Rice   Powd (Red Yeast Rice Extract) .... Take Two Tablets Daily 10)  Proair Hfa 108 (90 Base) Mcg/act Aers (Albuterol Sulfate) .... 2 Puffs Inhaled Every 4 Hours As Needed For Shortness of Breath or Wheezing 11)  Tussionex Pennkinetic Er 8-10 Mg/36ml Lqcr (Chlorpheniramine-Hydrocodone) .Marland Kitchen.. 1 Teaspoon By Mouth Two Times A Day For Cough. Dispense 60cc. 12)  Azithromycin 500 Mg Tabs (Azithromycin) .Marland Kitchen.. 1 Tab By Mouth Daily X 3 Days  Allergies (verified): 1)  Tetracycline Hcl (Tetracycline Hcl)  Past History:  Past Medical History: Last updated: 04/21/2007 head CT - small vessel disease - 06/29/1996 gallstones 10/97 TSH nl - 05/29/1998 ABI = 1.01 (normal) - 01/22/2002 ETT sx but no EKG changes - 06/18/2002 Cervical MRI -  07/15/2002  rectal bleeding with abd pain 03/04 Colonoscopy tubular adenoma polyp - 04/19/2003 Free testosterone borderline low - 04/11/2004  BMI 31, Waist 41.5` - 05/07/2006  Past Surgical History: Last updated: 04/21/2007  skin cancer excision L lat arm - 07/29/1993 solar keratosis excision L hand and - 05/29/1998 L ulnar nerve transfer - 01/18/2000  Family History: Last updated: 07/07/2009 Dementia, Alz, Glaucoma, HTN -  F - died 2993-03-13 after hip fracture  Vascular dementia - M died age 57 HTN, skin CA - sis  Social History: Last updated: 12/27/2009 Retired, 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, 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   +tenderness to supra and infra orbital sinuses Eyes:  vision grossly intact.   Nose:  External nasal examination shows no deformity or inflammation. Nasal mucosa are pink and moist without lesions or exudates. Mouth:  Oral mucosa and oropharynx without lesions or exudates.  Teeth in good repair. Lungs:  Normal respiratory effort, chest expands symmetrically. Lungs are clear to auscultation, no crackles or wheezes. Heart:  Normal rate and regular rhythm. S1 and S2 normal without gallop, murmur, click, rub or other extra sounds. Abdomen:  Bowel sounds positive,abdomen soft and non-tender without masses, organomegaly or hernias noted. Cervical Nodes:  No lymphadenopathy noted Axillary Nodes:  No palpable lymphadenopathy   Impression & Recommendations:  Problem # 1:  COUGH (ICD-786.2)  Cough and congestion 2/2 bronchitis vs bronchitic asthma vs sinusitis.  Past smoking history 1968-03-13) so will consider CXR if cough continues.  Pt has had similar problem in past that improved with expectorant.  Although not wheezing on exam, will Rx albuterol inhaler as needed dyspnea.  Will give tussionex for cough.  Rx Azithromycin, but advised pt not to take unless he still has not improved in 5 days.  He agreed.  Pt to rtc in 1-2 wks if not better.  ***Weight loss, most likely due to current sick symptoms.  Most likely not malignant process.  Will monitor weight.  Pt to rtc in weight loss/decrease appetite  continues.    Orders: [facility]- Est Level  3 ([address])  Complete Medication List: 1)  Amlodipine Besylate 10 Mg Tabs (Amlodipine besylate) .... Take 1 tablet by mouth once a day 2)  Cialis 20 Mg Tabs (Tadalafil) .... Take one dially as needed 3)  Androderm 5 Mg/24hr Pt24 (Testosterone) .... Apply one daily 4)  Cvs Aspirin Ec 81 Mg Tbec (Aspirin) .... Take one tablet daily 5)  Pain Relief 650 Mg Tbcr (Acetaminophen) .... Takes 2 tabs at bedtime 6)  Triple Flex 500-400-125 Mg Tabs (Glucosamine-chondroitin-msm) .... Takes 2 tabs daily 7)  Centrum Silver Tabs (Multiple vitamins-minerals) .... Take one tablet daily 8)  Icaps Areds Formula Tabs (Multiple vitamins-minerals) .... Take one tablet twice daily 9)  Red Yeast Rice Powd (Red yeast rice extract) .... Take two tablets daily 10)  Proair Hfa 108 (90 Base) Mcg/act Aers (Albuterol sulfate) .... 2 puffs inhaled every 4 hours as needed for shortness of  breath or wheezing 11)  Tussionex Pennkinetic Er 8-10 Mg/50ml Lqcr (Chlorpheniramine-hydrocodone) .Marland Kitchen.. 1 teaspoon by mouth two times a day for cough. dispense 60cc. 12)  Azithromycin 500 Mg Tabs (Azithromycin) .Marland Kitchen.. 1 tab by mouth daily x 3 days Prescriptions: AZITHROMYCIN 500 MG TABS (AZITHROMYCIN) 1 tab by mouth daily x 3 days  #3 x 0   Entered and Authorized by:   Angeline Slim MD   Signed by:   Angeline Slim MD on 03/15/2010   Method used:   Electronically to        [facility] [address] (retail)       [address]       [place], [place]  [address]       Ph: [phone] or [phone]       Fax: [phone]   RxID:   [id number] Sandria Senter ER 8-10 MG/5ML LQCR (CHLORPHENIRAMINE-HYDROCODONE) 1 teaspoon by mouth two times a day for cough. Dispense 60cc.  #1 x 0   Entered and Authorized by:   Angeline Slim MD   Signed by:   Angeline Slim MD on 03/15/2010   Method used:   Telephoned to ...       [facility] [address] (retail)       [address]       [place], [place]  [address]       Ph: [phone] or [phone]       Fax: [phone]   RxID:   [id number] PROAIR HFA 108 (90 BASE) MCG/ACT AERS (ALBUTEROL SULFATE) 2 puffs inhaled every 4 hours as needed for shortness of breath or wheezing  #1 x 3   Entered and Authorized by:   Angeline Slim MD   Signed by:   Angeline Slim MD on 03/15/2010   Method used:   Electronically to        [facility] [address] (retail)       [address]       [place], [place]  [address]       Ph: [phone] or [phone]       Fax: [phone]   RxID:   [id number] CIALIS 20 MG TABS (TADALAFIL) Take one dially as needed  #6 x 11   Entered and Authorized by:   Angeline Slim MD   Signed by:   Angeline Slim MD on 03/15/2010   Method used:   Electronically to        [facility] [address] (retail)       [address]       [place], [place]  [address]       Ph: [phone] or [phone]       Fax: [phone]   RxID:   [id number]    Vital Signs:  Patient profile:   75 year old male Height:      73 inches Weight:      231 pounds BMI:     30.59 O2 Sat:      94 % Temp:     98 degrees F Pulse rate:   65 / minute BP sitting:   123 / 83  (left arm) Cuff size:   large  Vitals Entered By: Dennison Nancy RN (Mar 15, 2010 1:45 PM)

## 2010-11-29 ENCOUNTER — Encounter: Payer: Self-pay | Admitting: Family Medicine

## 2010-11-30 NOTE — Assessment & Plan Note (Signed)
Summary: spot on face/concerned about skin cancer/eo   Vital Signs:  Patient profile:   75 year old male Height:      73 inches Weight:      232.9 pounds BMI:     30.84 Pulse rate:   68 / minute BP sitting:   130 / 80  (right arm)  Vitals Entered By: Arlyss Repress CMA, (November 09, 2010 8:58 AM) CC: check spots on face., CHF Management Is Patient Diabetic? No Pain Assessment Patient in pain? no        Primary Care Provider:  Zachery Dauer MD  CC:  check spots on face. and CHF Management.  History of Present Illness: Didn't exercise for 2 weeks due to having visitors. Had built up to an extra 30 minutes weekly.   Gets joint pains big toes, elbows, back and neck for which he takes Acetaminophen NSAIDs irritate his stomach.   Lesion under right eye gets a rough surface and he worries about skin cancer. Sister had part of nose removed with one and father also had surgeries.   Foot rash is better after using Lamsil.    Habits & Providers  Alcohol-Tobacco-Diet     Tobacco Status: quit > 6 months     Year Quit: 03-16-1968  Current Medications (verified): 1)  Amlodipine Besylate 10 Mg Tabs (Amlodipine Besylate) .... Take 1 Tablet By Mouth Once A Day 2)  Cialis 20 Mg Tabs (Tadalafil) .... Take One Dialy As Needed 3)  Androderm 5 Mg/24hr  Pt24 (Testosterone) .... Apply One Daily 4)  Cvs Aspirin Ec 81 Mg  Tbec (Aspirin) .... Take One Tablet Daily 5)  Pain Relief 650 Mg  Tbcr (Acetaminophen) .... Takes 2 Tabs At Bedtime 6)  Centrum Silver   Tabs (Multiple Vitamins-Minerals) .... Take One Tablet Daily 7)  Icaps Areds Formula  Tabs (Multiple Vitamins-Minerals) .... Take One Tablet Twice Daily 8)  Glucosamine-Msm 1500-500 Mg/53ml Liqd (Glucosamine Hcl-Msm) .... Take 2 Tabs Daily 9)  Fish Oil Concentrate 1000 Mg Caps (Omega-3 Fatty Acids) .... Take 2 Caps Daily 10)  Zostavax 81191 Unt/0.64ml Solr (Zoster Vaccine Live) .... Take As Directed  Allergies (verified): 1)  Tetracycline Hcl  (Tetracycline Hcl)  Family History: Dementia, Alz, Glaucoma, HTN, skin CA -  F - died 16-Mar-2993 after hip fracture  Vascular dementia - M died age 53 HTN, skin CA - sis  Social History: Smoking Status:  quit > 6 months  Physical Exam  General:  well-developed and overweight-appearing.   Lungs:  Normal respiratory effort, chest expands symmetrically. Lungs are clear to auscultation, no crackles or wheezes. Heart:  Normal rate and regular rhythm. S1 and S2 normal without gallop, murmur, click, rub or other extra sounds. Msk:  Neck slightly forward with limited extension but no arm symptoms  Minimal Heberdens nodes.  Neurologic:  Decreased filament touch below mid calves. Position sense normal. Decreased sensation left 4&5 digits. Normal strength and reflexes in UE  Skin:  3x5 mm ovoid slightly pigmented and raised rough surface lesion without irritation. Black seb K lesion right cheek. Multiple cherry and other angiomas over the trunk. A few epidermal inclusion cysts.   Tinea pedis is resolved. Tinea unguinum distally particularly left 1st and 2nd toes.    Psych:  Oriented X3, memory intact for recent and remote, normally interactive, good eye contact, and not depressed appearing.     Impression & Recommendations:  Problem # 1:  SEBORRHEIC KERATOSIS (ICD-702.19) Beneath left eye. Appears benign as do other skin lesions.  Orders: [facility]- Est  Level 4 ([address])  Problem # 2:  HYPERTENSION, BENIGN SYSTEMIC (ICD-401.1) Assessment: Improved  His updated medication list for this problem includes:    Amlodipine Besylate 10 Mg Tabs (Amlodipine besylate) .Marland Kitchen... Take 1 tablet by mouth once a day  Orders: [facility]- Est  Level 4 ([address])  Problem # 3:  OBESITY, NOS (ICD-278.00) Assessment: Unchanged  Orders: [facility]- Est  Level 4 ([address])  Problem # 4:  DERMATOPHYTOSIS OF FOOT (ICD-110.4) Improved Orders: [facility]- Est  Level 4 ([address])  Problem # 5:  CPK, ABNORMAL (ICD-790.5) He will follow-up with the  rheumatologist. Never was very high but improved off statins.   Problem # 6:  OSTEOARTHRITIS, MULTI SITES (ICD-715.98) We discussed trying Celebrex, but he wants to just use Acetaminophen  His updated medication list for this problem includes:    Cvs Aspirin Ec 81 Mg Tbec (Aspirin) .Marland Kitchen... Take one tablet daily    Pain Relief 650 Mg Tbcr (Acetaminophen) .Marland Kitchen... Takes 2 tabs at bedtime  Problem # 7:  CERVICAL SPINE DISORDER, NOS (ICD-723.9) Assessment: Unchanged left arm symptoms seem to be more from prior ulnar nerve injury, but will watch for more signs of cervical neuropathy  Problem # 8:  Preventive Health Care (ICD-V70.0) Tdap to be given. Rx for Zostavax  Complete Medication List: 1)  Amlodipine Besylate 10 Mg Tabs (Amlodipine besylate) .... Take 1 tablet by mouth once a day 2)  Cialis 20 Mg Tabs (Tadalafil) .... Take one dialy as needed 3)  Androderm 5 Mg/24hr Pt24 (Testosterone) .... Apply one daily 4)  Cvs Aspirin Ec 81 Mg Tbec (Aspirin) .... Take one tablet daily 5)  Pain Relief 650 Mg Tbcr (Acetaminophen) .... Takes 2 tabs at bedtime 6)  Centrum Silver Tabs (Multiple vitamins-minerals) .... Take one tablet daily 7)  Icaps Areds Formula Tabs (Multiple vitamins-minerals) .... Take one tablet twice daily 8)  Glucosamine-msm 1500-500 Mg/14ml Liqd (Glucosamine hcl-msm) .... Take 2 tabs daily 9)  Fish Oil Concentrate 1000 Mg Caps (Omega-3 fatty acids) .... Take 2 caps daily 10)  Zostavax 09811 Unt/0.105ml Solr (Zoster vaccine live) .... Take as directed  Other Orders: Tdap => 72yrs IM ([address]78) Admin 1st Vaccine ([address])  CHF Assessment/Plan:      The patient's current weight is 232.9 pounds.  His previous weight was 233.3 pounds.    Patient Instructions: 1)  Please schedule a follow-up appointment in 3 months .  2)  Please return for a FASTING Lipid Profile one(1) week before your next appointment as scheduled.  3)  Lose 6 pounds by the next visit 4)  You will increase your gym days  to 4 weekly and walking time to 30 minutes.  5)  Limit Acetaminophen to 3000 mg daily Prescriptions: ZOSTAVAX 13086 UNT/0.65ML SOLR (ZOSTER VACCINE LIVE) Take as directed  #1 x 0   Entered by:   Luretha Murphy NP   Authorized by:   Zachery Dauer MD   Signed by:   Luretha Murphy NP on 11/09/2010   Method used:   Print then Give to Patient   RxID:   [id number] ZOSTAVAX 44010 UNT/0.65ML SOLR (ZOSTER VACCINE LIVE) Take as directed  #0 x 0   Entered and Authorized by:   Zachery Dauer MD   Signed by:   Zachery Dauer MD on 11/09/2010   Method used:   Print then Give to Patient   RxID:   [id number]    Orders Added: 1)  Tdap => 46yrs IM [[address]] 2)  Admin 1st Vaccine [90471]  3)  [facility]- Est  Level 4 [[address]]   Immunizations Administered:  Tetanus Vaccine:    Vaccine Type: Tdap    Site: right deltoid    Mfr: GlaxoSmithKline    Dose: 0.25 ml    Route: IM    Given by: Arlyss Repress CMA,    Exp. Date: 07/29/2012    Lot #: [id number]    VIS given: 09/15/08 version given November 09, 2010.   Immunizations Administered:  Tetanus Vaccine:    Vaccine Type: Tdap    Site: right deltoid    Mfr: GlaxoSmithKline    Dose: 0.25 ml    Route: IM    Given by: Arlyss Repress CMA,    Exp. Date: 07/29/2012    Lot #: [id number]    VIS given: 09/15/08 version given November 09, 2010.     Prevention & Chronic Care Immunizations   Influenza vaccine: Fluvax MCR  (08/03/2010)   Influenza vaccine due: 08/06/2009    Tetanus booster: 11/09/2010: Tdap   Tetanus booster due: 10/29/2009    Pneumococcal vaccine: Done.  (07/29/1996)   Pneumococcal vaccine deferral: Not indicated  (07/07/2009)   Pneumococcal vaccine due: None    H. zoster vaccine: Not documented  Colorectal Screening   Hemoccult: Done.  (04/28/2006)   Hemoccult due: Not Indicated    Colonoscopy: Done.  (05/29/2006)   Colonoscopy due: 05/29/2016  Other Screening   PSA: 0.42  (08/03/2010)   PSA due due: 05/04/2009   Smoking status:  quit > 6 months  (11/09/2010)  Lipids   Total Cholesterol: 153  (01/16/2010)   LDL: 78  (01/16/2010)   LDL Direct: 120  (08/03/2010)   HDL: 35  (01/16/2010)   Triglycerides: 200  (01/16/2010)   Lipid panel due: 12/14/2009    SGOT (AST): 30  (01/16/2010)   SGPT (ALT): 30  (01/16/2010)   Alkaline phosphatase: 45  (01/16/2010)   Total bilirubin: 0.6  (01/16/2010)    Lipid flowsheet reviewed?: Yes   Progress toward LDL goal: Unchanged  Hypertension   Last Blood Pressure: 130 / 80  (11/09/2010)   Serum creatinine: 0.94  (01/16/2010)   Serum potassium 4.1  (01/16/2010)   Basic metabolic panel due: 01/04/2010    Hypertension flowsheet reviewed?: Yes   Progress toward BP goal: At goal  Self-Management Support :   Personal Goals (by the next clinic visit) :      Personal blood pressure goal: 140/90  (07/07/2009)     Personal LDL goal: 130  (07/07/2009)    Hypertension self-management support: Not documented    Lipid self-management support: Not documented

## 2010-11-30 NOTE — Consult Note (Signed)
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

## 2010-12-06 NOTE — Miscellaneous (Signed)
Summary: Zostavax immunization  Clinical Lists Changes  Observations: Added new observation of ZOSTAVAX: Zostavax (11/23/2010 14:55)      Other Immunization History:    Zostavax # 1:  Zostavax (11/23/2010)

## 2010-12-10 ENCOUNTER — Emergency Department (HOSPITAL_BASED_OUTPATIENT_CLINIC_OR_DEPARTMENT_OTHER)
Admission: EM | Admit: 2010-12-10 | Discharge: 2010-12-10 | Disposition: A | Discharge status: Home / Self Care | Payer: Medicare Other | Attending: Emergency Medicine | Admitting: Emergency Medicine

## 2010-12-10 DIAGNOSIS — I10 Essential (primary) hypertension: Secondary | ICD-10-CM | POA: Insufficient documentation

## 2010-12-10 DIAGNOSIS — S91009A Unspecified open wound, unspecified ankle, initial encounter: Secondary | ICD-10-CM | POA: Insufficient documentation

## 2010-12-10 DIAGNOSIS — Y92009 Unspecified place in unspecified non-institutional (private) residence as the place of occurrence of the external cause: Secondary | ICD-10-CM | POA: Insufficient documentation

## 2010-12-10 DIAGNOSIS — W268XXA Contact with other sharp object(s), not elsewhere classified, initial encounter: Secondary | ICD-10-CM | POA: Insufficient documentation

## 2010-12-10 DIAGNOSIS — S81009A Unspecified open wound, unspecified knee, initial encounter: Secondary | ICD-10-CM | POA: Insufficient documentation

## 2010-12-18 ENCOUNTER — Ambulatory Visit: Payer: Medicare Other | Admitting: *Deleted

## 2010-12-18 DIAGNOSIS — S91019A Laceration without foreign body, unspecified ankle, initial encounter: Secondary | ICD-10-CM

## 2010-12-18 NOTE — Progress Notes (Signed)
Patient in office to remove 2 staples from right ankle that were placed 8 days ago in ED. Dr. Tressia Danas looked at wound. No signs of infection, no drainage or redness.  staples removed and steri strips applied. Wound care instructions given.

## 2010-12-19 ENCOUNTER — Other Ambulatory Visit: Payer: Self-pay | Admitting: General Surgery

## 2010-12-19 ENCOUNTER — Ambulatory Visit (HOSPITAL_COMMUNITY): Payer: Medicare Other

## 2010-12-19 ENCOUNTER — Ambulatory Visit (HOSPITAL_BASED_OUTPATIENT_CLINIC_OR_DEPARTMENT_OTHER)
Admission: RE | Admit: 2010-12-19 | Discharge: 2010-12-19 | Disposition: A | Discharge status: Home / Self Care | Payer: Medicare Other | Source: Ambulatory Visit | Attending: General Surgery | Admitting: General Surgery

## 2010-12-19 DIAGNOSIS — I10 Essential (primary) hypertension: Secondary | ICD-10-CM | POA: Insufficient documentation

## 2010-12-19 DIAGNOSIS — Z01812 Encounter for preprocedural laboratory examination: Secondary | ICD-10-CM | POA: Insufficient documentation

## 2010-12-19 DIAGNOSIS — G5790 Unspecified mononeuropathy of unspecified lower limb: Secondary | ICD-10-CM | POA: Insufficient documentation

## 2010-12-19 DIAGNOSIS — Z01818 Encounter for other preprocedural examination: Secondary | ICD-10-CM | POA: Insufficient documentation

## 2010-12-19 DIAGNOSIS — Z01810 Encounter for preprocedural cardiovascular examination: Secondary | ICD-10-CM | POA: Insufficient documentation

## 2010-12-19 DIAGNOSIS — M6281 Muscle weakness (generalized): Secondary | ICD-10-CM | POA: Insufficient documentation

## 2010-12-19 LAB — COMPREHENSIVE METABOLIC PANEL
AST: 38 U/L — ABNORMAL HIGH (ref 0–37)
BUN: 13 mg/dL (ref 6–23)
Calcium: 9.1 mg/dL (ref 8.4–10.5)
Chloride: 104 mEq/L (ref 96–112)
GFR calc Af Amer: >60 mL/min (ref >60)
GFR calc non Af Amer: >60 mL/min (ref >60)
Glucose, Bld: 109 mg/dL — ABNORMAL HIGH (ref 70–99)
Sodium: 137 mEq/L (ref 135–145)
Total Bilirubin: 0.7 mg/dL (ref 0.3–1.2)

## 2010-12-19 LAB — DIFFERENTIAL
Basophils Relative: 1 % (ref 0–1)
Lymphocytes Relative: 32 % (ref 12–46)
Lymphs Abs: 2 10*3/uL (ref 0.7–4.0)
Monocytes Relative: 11 % (ref 3–12)
Neutro Abs: 3.4 10*3/uL (ref 1.7–7.7)
Neutrophils Relative %: 54 % (ref 43–77)

## 2010-12-19 LAB — CBC
Hemoglobin: 14.5 g/dL (ref 13.0–17.0)
MCHC: 33.4 g/dL (ref 30.0–36.0)
MCV: 95 fL (ref 78.0–100.0)
RBC: 4.57 MIL/uL (ref 4.22–5.81)
WBC: 6.3 10*3/uL (ref 4.0–10.5)

## 2010-12-21 NOTE — Op Note (Signed)
NAME:  Jose [name], Jose [name] NO.:  [id number]  MEDICAL RECORD NO.:  [id number]           PATIENT TYPE:  LOCATION:                                 FACILITY:  PHYSICIAN:  Skipper Dacosta. Kush Farabee, M.D.DATE OF BIRTH:  1936/03/02  DATE OF PROCEDURE:  12/19/2010 DATE OF DISCHARGE:                              OPERATIVE REPORT   POSTOPERATIVE DIAGNOSIS:  Generalized muscle weakness, for muscle biopsy, referred by Dr. Corliss Skains.  HISTORY:  Mr. Jose [name] is a 75 year old Caucasian male, retired Physicist, medical, who was referred to me by Dr. Sheffield Slider as his regular medical physician and he has been seen by Dr. Corliss Skains who has done EMGs, laboratory studies.  He has had a mildly elevated CPK and a muscle biopsy was recommended.  He understands the planned procedure that the tissue will be frozen and mailed to [facility] for their muscle biopsy protocol and hopefully results will be beneficial in his management.  He says that there is no generalized or no localized symptoms and they recommended that a left thigh muscle biopsy be performed.  Preoperatively, the patient's laboratory studies are unremarkable.  His white count is 6300 with hematocrit of 43.  Electrolytes now were normal.  He had a mildly elevated glucose of 109.  The patient was taken to the operative suite.  An LMA tube was used for the general anesthesia placed by anesthetist and the anesthesiologist.  The left thigh area which had been previously marked, the time-out completed, was prepped with Betadine solution and draped in a sterile manner.  The mid anterior portion of the thigh little vertical incision about inch and half in length was made down through the overlying fascia which was split and then the underlying muscle that looked a little palish and kind of atrophic was visible.  I then put a 4-0 Vicryl suture.  The proximal portion of the incision for basically hemostasis and then using an Allis  picking up a good centimeter wide and depth segment of muscle was transected and then a strip of muscle probably about 4 or 5 cm in length was removed and then this was cut and three people attached 2 pieces of tongue blade with 25 gauge needles and then immediately sent over for to be quick frozen for shipping today.  There were a couple of bleeders that were sutured with 4-0 Vicryl and then the muscle was kind of approximated with some sutures of 4-0 Vicryl.  The fascia was then closed with interrupted sutures of 4-0 Vicryl, subcutaneous interrupted 4-0 Vicryl and 3 subcutaneous 4-0 Vicryl sutures.  Benzoin and Steri-Strips on the skin.  The patient tolerated the procedure nicely and a sterile occlusive dressing was applied.  I did put about 20 cc of 0.25% Marcaine with adrenaline for immediate postoperative pain about 5 or 6 in the deeper proximal muscle area and then the remaining in the subcutaneous tissue proximal medial and laterally.  The patient will be released after a short stay in the recovery area and understands to minimize his activities over the next couple of days and hopefully will have results of the biopsy  in about 2 to 3 weeks.     Anselm Pancoast. Zachery Dakins, M.D.     WJW/MEDQ  D:  12/19/2010  T:  12/19/2010  Job:  [id number]  cc:   Dr. Corliss Skains  Electronically Signed by Consuello Bossier M.D. on 12/21/2010 09:46:25 AM

## 2011-01-08 ENCOUNTER — Encounter: Payer: Self-pay | Admitting: Home Health Services

## 2011-01-23 ENCOUNTER — Telehealth: Payer: Self-pay | Admitting: Family Medicine

## 2011-01-23 NOTE — Telephone Encounter (Signed)
Pt wants to know who MD would recommend him seeing as a chiropractor, pt sts he is having back pain, offered pt an appt here & he would prefer to see chiropractor.

## 2011-01-25 ENCOUNTER — Telehealth: Payer: Self-pay | Admitting: *Deleted

## 2011-01-25 NOTE — Telephone Encounter (Signed)
Called pt and he reports that he had seen a chiropractor before, but not in [place]. He just wants a recommendation who to see instead of looking into the phone book. I told the pt, that Dr.Hale is [place] and that I will ask S.Saxon, if she knows any chiropractor to recommend. Pt agreed. Fwd. To S.Humberto Seals

## 2011-01-31 NOTE — Telephone Encounter (Signed)
I am not familiar with this group to make a referral

## 2011-02-12 ENCOUNTER — Encounter: Payer: Self-pay | Admitting: Family Medicine

## 2011-02-13 NOTE — Telephone Encounter (Signed)
See previous messages. Will send to Dr.Hale for advise. Arlyss Repress

## 2011-02-14 ENCOUNTER — Other Ambulatory Visit: Payer: Self-pay | Admitting: Family Medicine

## 2011-02-14 DIAGNOSIS — E785 Hyperlipidemia, unspecified: Secondary | ICD-10-CM

## 2011-02-14 DIAGNOSIS — R748 Abnormal levels of other serum enzymes: Secondary | ICD-10-CM

## 2011-02-14 DIAGNOSIS — N400 Enlarged prostate without lower urinary tract symptoms: Secondary | ICD-10-CM

## 2011-02-15 ENCOUNTER — Telehealth: Payer: Self-pay | Admitting: *Deleted

## 2011-02-15 NOTE — Telephone Encounter (Signed)
Called and notified patient that Dr Sheffield Slider did not have a recommendation for a chiropractor, he would have to call around and find one that accepts his insurance.Jose [name], Rodena Medin

## 2011-02-15 NOTE — Telephone Encounter (Signed)
Message copied by Tessie Fass on Thu Feb 15, 2011  5:27 PM ------      Message from: Zachery Dauer      Created: Tue Feb 13, 2011  8:08 PM      Regarding: chiropracter recommendation       Please let him know that I don't have any particular recommendations for a chiropracter

## 2011-02-22 NOTE — Telephone Encounter (Signed)
I didn't have a recommendation for a chiropracter, and at this point don't believe there is a reason for a call-back

## 2011-02-27 ENCOUNTER — Other Ambulatory Visit: Payer: Medicare Other

## 2011-02-27 DIAGNOSIS — E785 Hyperlipidemia, unspecified: Secondary | ICD-10-CM

## 2011-02-27 DIAGNOSIS — N400 Enlarged prostate without lower urinary tract symptoms: Secondary | ICD-10-CM

## 2011-02-27 DIAGNOSIS — R748 Abnormal levels of other serum enzymes: Secondary | ICD-10-CM

## 2011-02-27 LAB — LIPID PANEL
Cholesterol: 170 mg/dL (ref 0–200)
HDL: 34 mg/dL — ABNORMAL LOW (ref >39)
Total CHOL/HDL Ratio: 5 Ratio

## 2011-02-27 NOTE — Progress Notes (Signed)
FLP,PSA AND CK DONE TODAY Ogden Handlin

## 2011-03-06 ENCOUNTER — Encounter: Payer: Self-pay | Admitting: Family Medicine

## 2011-03-06 ENCOUNTER — Ambulatory Visit (INDEPENDENT_AMBULATORY_CARE_PROVIDER_SITE_OTHER): Payer: Medicare Other | Admitting: Family Medicine

## 2011-03-06 VITALS — BP 125/75 | HR 68 | Temp 98.1°F | Ht 72.0 in | Wt 226.0 lb

## 2011-03-06 DIAGNOSIS — R748 Abnormal levels of other serum enzymes: Secondary | ICD-10-CM

## 2011-03-06 DIAGNOSIS — Z23 Encounter for immunization: Secondary | ICD-10-CM

## 2011-03-06 DIAGNOSIS — E669 Obesity, unspecified: Secondary | ICD-10-CM

## 2011-03-06 DIAGNOSIS — I10 Essential (primary) hypertension: Secondary | ICD-10-CM

## 2011-03-06 NOTE — Assessment & Plan Note (Signed)
No cause found by the rheumatologist on testing including muscle biopsy

## 2011-03-06 NOTE — Assessment & Plan Note (Signed)
Unchanged

## 2011-03-06 NOTE — Assessment & Plan Note (Signed)
well controlled  

## 2011-03-06 NOTE — Patient Instructions (Addendum)
Recheck in 6 months Sooner if any problems.  The pneumovax you got today will not have to be boosted in the future.

## 2011-03-06 NOTE — Progress Notes (Signed)
  Subjective:    Patient ID: Jose Master., male    DOB: June 13, 1936, 75 y.o.   MRN: [id number]  HPI he says he's been feeling well recently and thinks he is healthy for his age. Muscle biopsy did not show any signs of inflammation. The rheumatologist does not feel there is a significant pathologic cause for his calf aching, though his creatinine kinase remains mildly elevated. He continues to exercise in the gym at least 3 days weekly. He hasn't been able to lose weight because of inability to reduce the portions.    Review of Systems  Constitutional: Positive for unexpected weight change. Negative for activity change and appetite change.  Respiratory: Negative for shortness of breath.   Cardiovascular: Negative for chest pain.  Genitourinary: Negative for difficulty urinating.  Musculoskeletal: Positive for myalgias.  Neurological: Negative for weakness.  Psychiatric/Behavioral: Negative for dysphoric mood.       Objective:   Physical Exam  Constitutional: He appears well-developed.       Obese, mainly abdominal  Cardiovascular: Normal rate and regular rhythm.   Pulmonary/Chest: Effort normal and breath sounds normal.  Psychiatric: He has a normal mood and affect. His behavior is normal. Thought content normal.          Assessment & Plan:

## 2011-03-16 NOTE — Op Note (Signed)
   NAME:  Jose [name], FORGET NO.:  [id number]   MEDICAL RECORD NO.:  [id number]                   PATIENT TYPE:  AMB   LOCATION:  ENDO                                 FACILITY:  [facility]   PHYSICIAN:  Graylin Shiver, M.D.                DATE OF BIRTH:  25-Aug-1936   DATE OF PROCEDURE:  04/15/2003  DATE OF DISCHARGE:                                 OPERATIVE REPORT   PROCEDURE PERFORMED:  Colonoscopy.   INDICATIONS FOR PROCEDURE:  Rectal bleeding.   Informed consent was obtained.   PREMEDICATION:  Fentanyl 70 mcg IV, Versed 7 mg IV.   DESCRIPTION OF PROCEDURE:  With the patient in the left lateral position, a  rectal examination was performed and no masses were felt.  The Olympus  colonoscope was inserted into the rectum and advanced around the colon to  the cecum.  Cecal landmarks were identified.  The cecum and ascending colon  were normal.  In the proximal transverse colon, there were two small 8 mm  polyps removed with cold forceps.  The descending colon looked normal.  The  sigmoid showed some small scattered diverticula.  The rectum looked normal.  The scope was retroflexed in the rectum.  Some small internal hemorrhoids  were noted.  The scope was straightened and brought out.  The patient  tolerated the procedure well without complication.   IMPRESSION:  1. Two small polyps in the proximal ascending colon.  2. Small diverticula noted in the sigmoid region of the colon.  3. Small internal hemorrhoids.   The biopsies will be checked.  There is no evidence of active bleeding on  this exam.  The possible sources for bleeding could be the hemorrhoids or  the diverticula when the patient has had these episodes in the past.                                               Graylin Shiver, M.D.    SFG/MEDQ  D:  04/15/2003  T:  04/15/2003  Job:  [id number]   cc:   Deniece Portela A. Sheffield Slider, M.D.  [address] [place]  [place] [address]  Fax: [phone]

## 2011-03-28 ENCOUNTER — Telehealth: Payer: Self-pay | Admitting: Family Medicine

## 2011-03-28 NOTE — Telephone Encounter (Signed)
I signed and mailed a form from [facility], [place], DPT for balance, gait, and strength training

## 2011-03-30 HISTORY — PX: SHOULDER INJECTION: SHX5048

## 2011-03-30 HISTORY — PX: OTHER SURGICAL HISTORY: SHX169

## 2011-04-04 ENCOUNTER — Other Ambulatory Visit: Payer: Self-pay | Admitting: Oncology

## 2011-04-04 ENCOUNTER — Encounter (HOSPITAL_BASED_OUTPATIENT_CLINIC_OR_DEPARTMENT_OTHER): Payer: Medicare Other | Admitting: Oncology

## 2011-04-04 DIAGNOSIS — G729 Myopathy, unspecified: Secondary | ICD-10-CM

## 2011-04-04 DIAGNOSIS — I10 Essential (primary) hypertension: Secondary | ICD-10-CM

## 2011-04-04 DIAGNOSIS — Z85828 Personal history of other malignant neoplasm of skin: Secondary | ICD-10-CM

## 2011-04-04 DIAGNOSIS — D472 Monoclonal gammopathy: Secondary | ICD-10-CM

## 2011-04-04 LAB — CBC WITH DIFFERENTIAL/PLATELET
Basophils Absolute: 0.1 10*3/uL (ref 0.0–0.1)
EOS%: 1.2 % (ref 0.0–7.0)
HCT: 40.5 % (ref 38.4–49.9)
HGB: 14.1 g/dL (ref 13.0–17.1)
MCH: 32.7 pg (ref 27.2–33.4)
MCV: 94.1 fL (ref 79.3–98.0)
MONO%: 7 % (ref 0.0–14.0)
NEUT%: 64 % (ref 39.0–75.0)
Platelets: 233 10*3/uL (ref 140–400)

## 2011-04-06 LAB — COMPREHENSIVE METABOLIC PANEL
ALT: 43 U/L (ref 0–53)
AST: 45 U/L — ABNORMAL HIGH (ref 0–37)
Albumin: 4.7 g/dL (ref 3.5–5.2)
CO2: 30 mEq/L (ref 19–32)
Calcium: 9.7 mg/dL (ref 8.4–10.5)
Chloride: 103 mEq/L (ref 96–112)
Creatinine, Ser: 1.1 mg/dL (ref 0.50–1.35)
Potassium: 4.1 mEq/L (ref 3.5–5.3)
Total Protein: 7.1 g/dL (ref 6.0–8.3)

## 2011-04-06 LAB — PROTEIN ELECTROPHORESIS, SERUM
Alpha-2-Globulin: 11.2 % (ref 7.1–11.8)
Gamma Globulin: 14.5 % (ref 11.1–18.8)
Total Protein, Serum Electrophoresis: 7.1 g/dL (ref 6.0–8.3)

## 2011-04-06 LAB — KAPPA/LAMBDA LIGHT CHAINS
Kappa free light chain: 1.63 mg/dL (ref 0.33–1.94)
Lambda Free Lght Chn: 2.21 mg/dL (ref 0.57–2.63)

## 2011-04-09 ENCOUNTER — Telehealth: Payer: Self-pay | Admitting: Family Medicine

## 2011-04-09 NOTE — Telephone Encounter (Signed)
Jose [name],  My note from 03/28/2011 in the electronic record:  'I signed and mailed a form from [facility], [place], DPT for balance, gait, and strength training" and I remember putting it in the mail.   I had signed one earlier, that i believe went by fax.   I don't have a copy, so if the [place] Mail failed [place], I'm afraid he will have to send it again.   Sorry, Jose [name]    -----Original Message----- From: [name] [[email or web address]] Sent: Mon 04/09/2011 7:00 PM To: Jose [name] Subject: I Need Your Help   Dr Sheffield Slider:  I think I need your help. My physical therapist insists that she has not received any documentation back from the clinic. She says that she talked to your nurse a couple of weeks ago and was assured that you had signed off on the treatment plan, that the nurse had it in her hand and that it was going in the mail that day. As of today, more than two weeks later according to the therapist, she still has not received it. Could you please check internally as see if you can determine that it was, in fact, mailed. Whether it was mailed or not, I'm assuming you guys kept a copy of it for my file. If you did I'd really appreciate it if you would sign a copt that I could come pick up and hand deliver to the therapist. At least that way I'll be able to close the loop and proceed.  Thanks,  Jose [name] [phone]

## 2011-04-13 ENCOUNTER — Encounter (HOSPITAL_BASED_OUTPATIENT_CLINIC_OR_DEPARTMENT_OTHER): Payer: Medicare Other | Admitting: Oncology

## 2011-04-13 DIAGNOSIS — M1990 Unspecified osteoarthritis, unspecified site: Secondary | ICD-10-CM

## 2011-04-13 DIAGNOSIS — D472 Monoclonal gammopathy: Secondary | ICD-10-CM

## 2011-04-13 DIAGNOSIS — E785 Hyperlipidemia, unspecified: Secondary | ICD-10-CM

## 2011-04-13 DIAGNOSIS — I10 Essential (primary) hypertension: Secondary | ICD-10-CM

## 2011-05-30 ENCOUNTER — Encounter: Payer: Self-pay | Admitting: Family Medicine

## 2011-07-09 ENCOUNTER — Encounter: Payer: Self-pay | Admitting: Family Medicine

## 2011-07-10 ENCOUNTER — Ambulatory Visit (INDEPENDENT_AMBULATORY_CARE_PROVIDER_SITE_OTHER): Payer: Medicare Other | Admitting: Family Medicine

## 2011-07-10 ENCOUNTER — Encounter: Payer: Self-pay | Admitting: Family Medicine

## 2011-07-10 VITALS — BP 139/76 | HR 82 | Wt 231.6 lb

## 2011-07-10 DIAGNOSIS — Z23 Encounter for immunization: Secondary | ICD-10-CM

## 2011-07-10 DIAGNOSIS — E291 Testicular hypofunction: Secondary | ICD-10-CM

## 2011-07-10 DIAGNOSIS — I10 Essential (primary) hypertension: Secondary | ICD-10-CM

## 2011-07-10 DIAGNOSIS — M1990 Unspecified osteoarthritis, unspecified site: Secondary | ICD-10-CM

## 2011-07-10 MED ORDER — TESTOSTERONE 4 MG/24HR TD PT24: 1.0000 | MEDICATED_PATCH | Freq: Every day | TRANSDERMAL | Status: DC

## 2011-07-10 MED ORDER — AMLODIPINE BESYLATE 10 MG PO TABS: 10.0000 mg | ORAL_TABLET | Freq: Every day | ORAL | Status: DC

## 2011-07-10 NOTE — Progress Notes (Signed)
  Subjective:    Patient ID: Jose Master., male    DOB: Mar 14, 1936, 75 y.o.   MRN: [id number]  HPI comes in for refills of his testosterone patch and amlodipine. He will be switching to Dr. Dewain [name] of [facility] which is closer to his home in [place] and who is his wife's doctor.  Has not had a chest pain or shortness of breath. He has been walking about 2 miles in 45 minutes  His physical therapy and had a result his left hip pain which is partially due to not bear weight on his left great toe.  He saw Dr. Deniece [name] your for left shoulder pain and will have arthroscopic surgery on that tomorrow. Also Dr. Liz [name] will do a left hand first MP joint replacement tomorrow. Injections didn't help    Review of Systems     Objective:   Physical Exam  Cardiovascular: Normal rate and regular rhythm.   Pulmonary/Chest: Effort normal.  Musculoskeletal: He exhibits no edema.       Neoprene support on the left thumb  Left great toe slightly overrides the second          Assessment & Plan:

## 2011-07-10 NOTE — Assessment & Plan Note (Signed)
L hip is improved, but L shoulder and 1st MP joint are needing surgery

## 2011-07-10 NOTE — Patient Instructions (Signed)
Continue your medicines the same  Good luck with your surgeries  I will send your information to Dr Golden Hamburger

## 2011-07-10 NOTE — Assessment & Plan Note (Signed)
Continue same dose. He denies BPH symptoms

## 2011-07-10 NOTE — Assessment & Plan Note (Signed)
controlled 

## 2011-07-17 ENCOUNTER — Ambulatory Visit: Payer: Medicare Other | Attending: Physician Assistant | Admitting: Physical Therapy

## 2011-07-17 DIAGNOSIS — M25619 Stiffness of unspecified shoulder, not elsewhere classified: Secondary | ICD-10-CM | POA: Insufficient documentation

## 2011-07-17 DIAGNOSIS — M25519 Pain in unspecified shoulder: Secondary | ICD-10-CM | POA: Insufficient documentation

## 2011-07-17 DIAGNOSIS — IMO0001 Reserved for inherently not codable concepts without codable children: Secondary | ICD-10-CM | POA: Insufficient documentation

## 2011-07-17 DIAGNOSIS — R5381 Other malaise: Secondary | ICD-10-CM | POA: Insufficient documentation

## 2011-07-23 ENCOUNTER — Ambulatory Visit: Payer: Medicare Other | Admitting: Physical Therapy

## 2011-07-26 ENCOUNTER — Ambulatory Visit: Payer: Medicare Other | Admitting: Physical Therapy

## 2011-07-26 ENCOUNTER — Other Ambulatory Visit: Payer: Self-pay | Admitting: Family Medicine

## 2011-07-26 MED ORDER — TADALAFIL 20 MG PO TABS: 20.0000 mg | ORAL_TABLET | Freq: Every day | ORAL | Status: DC | PRN

## 2011-07-30 ENCOUNTER — Ambulatory Visit: Payer: Medicare Other | Attending: Physician Assistant | Admitting: Physical Therapy

## 2011-07-30 DIAGNOSIS — M25519 Pain in unspecified shoulder: Secondary | ICD-10-CM | POA: Insufficient documentation

## 2011-07-30 DIAGNOSIS — M25619 Stiffness of unspecified shoulder, not elsewhere classified: Secondary | ICD-10-CM | POA: Insufficient documentation

## 2011-07-30 DIAGNOSIS — IMO0001 Reserved for inherently not codable concepts without codable children: Secondary | ICD-10-CM | POA: Insufficient documentation

## 2011-07-30 DIAGNOSIS — R5381 Other malaise: Secondary | ICD-10-CM | POA: Insufficient documentation

## 2011-08-02 ENCOUNTER — Ambulatory Visit: Payer: Medicare Other | Admitting: Physical Therapy

## 2011-08-06 ENCOUNTER — Ambulatory Visit: Payer: Medicare Other | Admitting: Physical Therapy

## 2011-08-09 ENCOUNTER — Ambulatory Visit: Payer: Medicare Other | Admitting: Physical Therapy

## 2011-08-13 ENCOUNTER — Ambulatory Visit: Payer: Medicare Other | Admitting: Physical Therapy

## 2011-08-15 ENCOUNTER — Ambulatory Visit: Payer: Medicare Other | Admitting: Physical Therapy

## 2011-08-21 ENCOUNTER — Ambulatory Visit: Payer: Medicare Other | Admitting: Physical Therapy

## 2011-08-23 ENCOUNTER — Ambulatory Visit: Payer: Medicare Other | Admitting: Physical Therapy

## 2011-08-28 ENCOUNTER — Ambulatory Visit: Payer: Medicare Other | Admitting: Physical Therapy

## 2011-08-31 ENCOUNTER — Ambulatory Visit: Payer: Medicare Other | Attending: Physician Assistant | Admitting: Physical Therapy

## 2011-08-31 DIAGNOSIS — IMO0001 Reserved for inherently not codable concepts without codable children: Secondary | ICD-10-CM | POA: Insufficient documentation

## 2011-08-31 DIAGNOSIS — M25619 Stiffness of unspecified shoulder, not elsewhere classified: Secondary | ICD-10-CM | POA: Insufficient documentation

## 2011-08-31 DIAGNOSIS — R5381 Other malaise: Secondary | ICD-10-CM | POA: Insufficient documentation

## 2011-08-31 DIAGNOSIS — M25519 Pain in unspecified shoulder: Secondary | ICD-10-CM | POA: Insufficient documentation

## 2011-09-04 ENCOUNTER — Ambulatory Visit: Payer: Medicare Other | Admitting: Physical Therapy

## 2011-09-06 ENCOUNTER — Encounter: Payer: Medicare Other | Admitting: Physical Therapy

## 2011-09-06 ENCOUNTER — Ambulatory Visit: Payer: Medicare Other | Admitting: Physical Therapy

## 2011-09-11 ENCOUNTER — Ambulatory Visit: Payer: Medicare Other | Admitting: Physical Therapy

## 2011-09-13 ENCOUNTER — Ambulatory Visit: Payer: Medicare Other | Admitting: Physical Therapy

## 2011-09-25 ENCOUNTER — Ambulatory Visit: Payer: Medicare Other | Admitting: Physical Therapy

## 2011-09-27 ENCOUNTER — Ambulatory Visit: Payer: Medicare Other | Admitting: Physical Therapy

## 2011-10-02 ENCOUNTER — Ambulatory Visit: Payer: Medicare Other | Attending: Physician Assistant | Admitting: Physical Therapy

## 2011-10-02 DIAGNOSIS — M25619 Stiffness of unspecified shoulder, not elsewhere classified: Secondary | ICD-10-CM | POA: Insufficient documentation

## 2011-10-02 DIAGNOSIS — IMO0001 Reserved for inherently not codable concepts without codable children: Secondary | ICD-10-CM | POA: Insufficient documentation

## 2011-10-02 DIAGNOSIS — M25519 Pain in unspecified shoulder: Secondary | ICD-10-CM | POA: Insufficient documentation

## 2011-10-02 DIAGNOSIS — R5381 Other malaise: Secondary | ICD-10-CM | POA: Insufficient documentation

## 2011-10-04 ENCOUNTER — Encounter: Payer: Self-pay | Admitting: *Deleted

## 2011-10-04 ENCOUNTER — Ambulatory Visit: Payer: Medicare Other | Admitting: Physical Therapy

## 2011-10-05 ENCOUNTER — Other Ambulatory Visit (HOSPITAL_BASED_OUTPATIENT_CLINIC_OR_DEPARTMENT_OTHER): Payer: Medicare Other | Admitting: Lab

## 2011-10-05 ENCOUNTER — Encounter: Payer: Self-pay | Admitting: Oncology

## 2011-10-05 ENCOUNTER — Other Ambulatory Visit: Payer: Self-pay | Admitting: Oncology

## 2011-10-05 DIAGNOSIS — D472 Monoclonal gammopathy: Secondary | ICD-10-CM

## 2011-10-05 DIAGNOSIS — G729 Myopathy, unspecified: Secondary | ICD-10-CM

## 2011-10-05 DIAGNOSIS — Z85828 Personal history of other malignant neoplasm of skin: Secondary | ICD-10-CM

## 2011-10-05 DIAGNOSIS — I10 Essential (primary) hypertension: Secondary | ICD-10-CM

## 2011-10-05 LAB — CBC WITH DIFFERENTIAL/PLATELET
BASO%: 0.7 % (ref 0.0–2.0)
EOS%: 1.7 % (ref 0.0–7.0)
HCT: 40.7 % (ref 38.4–49.9)
LYMPH%: 26.1 % (ref 14.0–49.0)
MCH: 32.7 pg (ref 27.2–33.4)
MCHC: 34.7 g/dL (ref 32.0–36.0)
MONO%: 5.8 % (ref 0.0–14.0)
NEUT%: 65.7 % (ref 39.0–75.0)
Platelets: 242 10*3/uL (ref 140–400)

## 2011-10-09 LAB — PROTEIN ELECTROPHORESIS, SERUM
Alpha-2-Globulin: 11.7 % (ref 7.1–11.8)
Beta Globulin: 5.5 % (ref 4.7–7.2)
Gamma Globulin: 14.4 % (ref 11.1–18.8)
Total Protein, Serum Electrophoresis: 7.5 g/dL (ref 6.0–8.3)

## 2011-10-09 LAB — COMPREHENSIVE METABOLIC PANEL
ALT: 37 U/L (ref 0–53)
AST: 38 U/L — ABNORMAL HIGH (ref 0–37)
Creatinine, Ser: 1.08 mg/dL (ref 0.50–1.35)
Total Bilirubin: 0.5 mg/dL (ref 0.3–1.2)

## 2011-10-09 LAB — KAPPA/LAMBDA LIGHT CHAINS: Lambda Free Lght Chn: 0.72 mg/dL (ref 0.57–2.63)

## 2011-10-12 ENCOUNTER — Ambulatory Visit (HOSPITAL_BASED_OUTPATIENT_CLINIC_OR_DEPARTMENT_OTHER): Payer: Medicare Other | Admitting: Oncology

## 2011-10-12 ENCOUNTER — Telehealth: Payer: Self-pay | Admitting: Oncology

## 2011-10-12 VITALS — BP 147/87 | HR 81 | Temp 97.2°F | Ht 72.0 in | Wt 231.3 lb

## 2011-10-12 DIAGNOSIS — D472 Monoclonal gammopathy: Secondary | ICD-10-CM

## 2011-10-12 DIAGNOSIS — C109 Malignant neoplasm of oropharynx, unspecified: Secondary | ICD-10-CM

## 2011-10-12 NOTE — Telephone Encounter (Signed)
appts made for 6/7 and 6/14,printed for  Pt,aom

## 2011-10-12 NOTE — Progress Notes (Signed)
[facility] OFF[facility] PROGRESS NOTE  Jose Chad, MD  DIAGNOSIS:   IgM kappa MGUS.  CURRENT THERAPY:  active watchful observation.  INTERVAL HISTORY: Jose [name]. 75 y.o. male returns for regular follow up.  He developed a left calf rash under this week and was given a prescription soft antibiotic which he thought was doxycycline with improvement of the erythema and pain. He denies extension of the erythema or fever, or purulent discharge.  He still has been having left shoulder and left leg numbness and tingling. Extensive fibrillation in the past was negative for myopathy, or degenerative disc disease. However he still able to exercise by walking about half an hour every day in addition to lifting weight with any weakness. He has noticed some funny sensation when he ambulates over the past few years; however he denies frequent fall.   Patient denies fatigue, headache, visual changes, confusion, drenching night sweats, palpable lymph node swelling, mucositis, odynophagia, dysphagia, nausea vomiting, jaundice, chest pain, palpitation, shortness of breath, dyspnea on exertion, productive cough, gum bleeding, epistaxis, hematemesis, hemoptysis, abdominal pain, abdominal swelling, early satiety, melena, hematochezia, hematuria, skin rash, spontaneous bleeding, joint swelling, joint pain, heat or cold intolerance, bowel bladder incontinence, back pain, depression, suicidal or homocidal ideation, feeling hopelessness.   MEDICAL HISTORY: Past Medical History  Diagnosis Date  . History of CT scan of head 1997    small vessel disease  . Gallstones 07/1996  . History of ETT 05/2002     no EKG changes  . History of MRI of cervical spine 07/15/2002  . Rectal bleeding 12/2002    colonoscopy    SURGICAL HISTORY:  Past Surgical History  Procedure Date  . Skin cancer excision 07/1993    left lateral arm  . Keratosis excision 05/1998    left hand  . Ulnar nerve transfer 12/1999      left  . Colonoscopy w/ polypectomy 04/19/2003    tubular adenoma  . Shoulder injection June 2012    left, [place]  . Thumb injection June 2012    left, [place]  . Great toe arthrodesis, interphalangeal joint 1978    MEDICATIONS: Current Outpatient Prescriptions  Medication Sig Dispense Refill  . acetaminophen (PAIN RELIEF) 650 MG CR tablet Take 1,300 mg by mouth at bedtime.       Marland Kitchen amLODipine (NORVASC) 10 MG tablet Take 1 tablet (10 mg total) by mouth daily.  90 tablet  3  . aspirin ([employer] ASPIRIN EC) 81 MG EC tablet Take 81 mg by mouth daily.        Marland Kitchen co-enzyme Q-10 30 MG capsule Take 100 mg by mouth 2 (two) times daily.        . Glucosamine HCl-MSM (GLUCOSAMINE-MSM) 1500-500 MG/30ML LIQD Take 2 tablets by mouth daily.       . Multiple Vitamins-Minerals (CENTRUM SILVER PO) Take 1 tablet by mouth daily.       . Multiple Vitamins-Minerals (ICAPS) TABS Take 1 tablet by mouth 2 (two) times daily.       . Omega-3 Fatty Acids (FISH OIL CONCENTRATE) 1000 MG CAPS Take 2 capsules by mouth daily.       . tadalafil (CIALIS) 20 MG tablet Take 1 tablet (20 mg total) by mouth daily as needed.  10 tablet  5  . Testosterone 4 MG/24HR PT24 Place 1 patch onto the skin daily.  90 patch  3    ALLERGIES:  is allergic to tetracycline hcl.  REVIEW OF SYSTEMS:  The  rest of the 14-point review of system was negative.   Filed Vitals:   10/12/11 1040  BP: 147/87  Pulse: 81  Temp: 97.2 F (36.2 C)   Wt Readings from Last 3 Encounters:  10/12/11 231 lb 4.8 oz (104.917 kg)  04/13/11 229 lb 11.2 oz (104.191 kg)  07/10/11 231 lb 9.6 oz (105.053 kg)   ECOG Performance status: 0  PHYSICAL EXAMINATION:   General:  well-nourished in no acute distress.  Eyes:  no scleral icterus.  ENT:  There were no oropharyngeal lesions.  Neck was without thyromegaly.  Lymphatics:  Negative cervical, supraclavicular or axillary adenopathy.  Respiratory: lungs were clear bilaterally without wheezing or crackles.   Cardiovascular:  Regular rate and rhythm, S1/S2, without murmur, rub or gallop.  There was no pedal edema.  GI:  abdomen was soft, flat, nontender, nondistended, without organomegaly.  Muscoloskeletal:  no spinal tenderness of palpation of vertebral spine.  Skin exam was without echymosis, petichae.  There was a 3x3 cm eythema with a red central head in the right lateral calf.  There was no pain, purulent discharge in this area.  Neuro exam was nonfocal.  Patient was able to get on and off exam table without assistance.  Gait was normal.  Patient was alerted and oriented.  Attention was good.   Language was appropriate.  Mood was normal without depression.  Speech was not pressured.  Thought content was not tangential.     LABORATORY/RADIOLOGY DATA: WBC 6.4; Hgb 14.1; Plt 242.   Cr 1.08; Ca 9.7.  SPEP:  No M-spike; slight elevation of kappa light chain at 1.68.    ASSESSMENT AND PLAN:   1. MGUS:  I again discussed with Jose [name] that there is no clinical history, physical exam, lab test findings to suggest progression to multiple myeloma.  His M spike is now negative and his serum light chains are only slightly elevated.  He has neuropathy; however, this is not related to any plasma cell dyscrasia process.  I'll see him one more time in 6 months to ensure that his light chains are not increasing before referring him back to his PCP.   2. Hypertension:  Well controlled on amlodipine per PCP. 3. Hyperlipidemia:  On fish oil per PCP. 4. Osteoarthritis:  On Tylenol Arthritis per PCP and glucosamine.  Left LE symptoms have been extensively worked up without obvious etiology.  Again, it's not due to plasmacell dyscrasia as he does not have myeloma.  5. Follow up with me in 6 months.

## 2011-10-30 HISTORY — PX: SHOULDER ARTHROSCOPY W/ ROTATOR CUFF REPAIR: SHX2400

## 2012-01-28 ENCOUNTER — Encounter (HOSPITAL_BASED_OUTPATIENT_CLINIC_OR_DEPARTMENT_OTHER): Payer: Self-pay

## 2012-01-28 ENCOUNTER — Other Ambulatory Visit: Payer: Self-pay

## 2012-01-28 ENCOUNTER — Emergency Department (INDEPENDENT_AMBULATORY_CARE_PROVIDER_SITE_OTHER): Payer: Medicare Other

## 2012-01-28 ENCOUNTER — Observation Stay (HOSPITAL_BASED_OUTPATIENT_CLINIC_OR_DEPARTMENT_OTHER)
Admission: EM | Admit: 2012-01-28 | Discharge: 2012-01-29 | Disposition: A | Discharge status: Home / Self Care | Payer: Medicare Other | Source: Ambulatory Visit | Attending: Emergency Medicine | Admitting: Emergency Medicine

## 2012-01-28 DIAGNOSIS — M542 Cervicalgia: Secondary | ICD-10-CM

## 2012-01-28 DIAGNOSIS — R4789 Other speech disturbances: Secondary | ICD-10-CM

## 2012-01-28 DIAGNOSIS — G319 Degenerative disease of nervous system, unspecified: Secondary | ICD-10-CM

## 2012-01-28 DIAGNOSIS — G459 Transient cerebral ischemic attack, unspecified: Principal | ICD-10-CM

## 2012-01-28 DIAGNOSIS — I10 Essential (primary) hypertension: Secondary | ICD-10-CM | POA: Insufficient documentation

## 2012-01-28 DIAGNOSIS — E876 Hypokalemia: Secondary | ICD-10-CM

## 2012-01-28 HISTORY — DX: Essential (primary) hypertension: I10

## 2012-01-28 LAB — CBC
HCT: 40.8 % (ref 39.0–52.0)
Hemoglobin: 14.4 g/dL (ref 13.0–17.0)
MCH: 32.7 pg (ref 26.0–34.0)
MCHC: 35.3 g/dL (ref 30.0–36.0)
MCV: 92.5 fL (ref 78.0–100.0)
Platelets: 271 K/uL (ref 150–400)
RBC: 4.41 MIL/uL (ref 4.22–5.81)
RDW: 12.6 % (ref 11.5–15.5)
WBC: 7.7 10*3/uL (ref 4.0–10.5)

## 2012-01-28 LAB — BASIC METABOLIC PANEL WITH GFR
BUN: 19 mg/dL (ref 6–23)
Calcium: 9.5 mg/dL (ref 8.4–10.5)
GFR calc Af Amer: 83 mL/min — ABNORMAL LOW (ref >90)
GFR calc non Af Amer: 72 mL/min — ABNORMAL LOW (ref >90)
Glucose, Bld: 141 mg/dL — ABNORMAL HIGH (ref 70–99)
Potassium: 3.3 meq/L — ABNORMAL LOW (ref 3.5–5.1)
Sodium: 140 meq/L (ref 135–145)

## 2012-01-28 LAB — DIFFERENTIAL
Basophils Absolute: 0.1 10*3/uL (ref 0.0–0.1)
Basophils Relative: 1 % (ref 0–1)
Eosinophils Absolute: 0.2 K/uL (ref 0.0–0.7)
Eosinophils Relative: 2 % (ref 0–5)
Lymphocytes Relative: 43 % (ref 12–46)
Lymphs Abs: 3.3 K/uL (ref 0.7–4.0)
Monocytes Absolute: 0.5 10*3/uL (ref 0.1–1.0)
Monocytes Relative: 7 % (ref 3–12)
Neutro Abs: 3.7 10*3/uL (ref 1.7–7.7)
Neutrophils Relative %: 48 % (ref 43–77)

## 2012-01-28 LAB — PROTIME-INR
INR: 1.04 (ref 0.00–1.49)
Prothrombin Time: 13.8 s (ref 11.6–15.2)

## 2012-01-28 LAB — BASIC METABOLIC PANEL
CO2: 28 mEq/L (ref 19–32)
Chloride: 100 mEq/L (ref 96–112)
Creatinine, Ser: 1 mg/dL (ref 0.50–1.35)

## 2012-01-28 LAB — APTT: aPTT: 38 s — ABNORMAL HIGH (ref 24–37)

## 2012-01-28 LAB — CARDIAC PANEL(CRET KIN+CKTOT+MB+TROPI)
CK, MB: 8.8 ng/mL (ref 0.3–4.0)
Relative Index: 2 (ref 0.0–2.5)
Total CK: 450 U/L — ABNORMAL HIGH (ref 7–232)
Troponin I: <0.3 ng/mL (ref <0.30)

## 2012-01-28 MED ORDER — ASPIRIN 325 MG PO TABS
ORAL_TABLET | ORAL | Status: AC
  Filled 2012-01-28: qty 1

## 2012-01-28 MED ORDER — ASPIRIN 81 MG PO TABS
325.0000 mg | ORAL_TABLET | Freq: Once | ORAL | Status: DC
  Administered 2012-01-28: 325 mg via ORAL

## 2012-01-28 MED ORDER — ACETAMINOPHEN 325 MG PO TABS: 650.0000 mg | ORAL_TABLET | ORAL | Status: DC | PRN

## 2012-01-28 MED ORDER — POTASSIUM CHLORIDE CRYS ER 20 MEQ PO TBCR
20.0000 meq | EXTENDED_RELEASE_TABLET | Freq: Once | ORAL | Status: AC
  Administered 2012-01-28: 20 meq via ORAL
  Filled 2012-01-28: qty 1

## 2012-01-28 MED ORDER — SODIUM CHLORIDE 0.9 % IV BOLUS (SEPSIS)
500.0000 mL | Freq: Once | INTRAVENOUS | Status: AC
  Administered 2012-01-28: 1000 mL via INTRAVENOUS

## 2012-01-28 MED ORDER — SODIUM CHLORIDE 0.9 % IV SOLN: INTRAVENOUS | Status: DC

## 2012-01-28 NOTE — ED Notes (Signed)
Pt states that he and his wife were talking about 2100 and he was trying to say the word "creepy" and that he was unable to do so, also states that he was having some other expressive aphasia, which resolved after less than 5 minutes had passed.  Pt states he feels "fuzzy".

## 2012-01-28 NOTE — ED Provider Notes (Addendum)
This chart was scribed for Jose [name]. Oletta Lamas, MD by Williemae Natter. The patient was seen in room MH06/MH06 at 9:43 PM.  CSN: [id number]  Arrival date & time 01/28/12  2121   First MD Initiated Contact with Patient 01/28/12 2135      Chief Complaint  Patient presents with  . Transient Ischemic Attack    (Consider location/radiation/quality/duration/timing/severity/associated sxs/prior treatment) HPI Jose [name]. is a 76 y.o. male who presents to the Emergency Department complaining of an acute onset TIA. Pt had trouble saying the word "creeping" around 9:00 PM tonight at home. Episode of speech difficulty lasted for about 4 min. Pt's speech was fine up until that point. No blurred vision, no headache, no palpitations, no weakness, no dizziness, no numbness in arms or legs. Pt reports that he was not dizzy but was "fuzzy". Neck is stiff and sore on the right side. Pt has hx of seizures while in the [employer] over 30 years ago. Pt reports feeling dizzy and neck tightness before onset of grand mal seizure. Has htn. No diabetes. No heart problems. pts mother has a hx of TIA's. [facility] Past Medical History  Diagnosis Date  . History of CT scan of head 1997    small vessel disease  . Gallstones 07/1996  . History of ETT 05/2002     no EKG changes  . History of MRI of cervical spine 07/15/2002  . Rectal bleeding 12/2002    colonoscopy  . Hypertension     Past Surgical History  Procedure Date  . Skin cancer excision 07/1993    left lateral arm  . Keratosis excision 05/1998    left hand  . Ulnar nerve transfer 12/1999    left  . Colonoscopy w/ polypectomy 04/19/2003    tubular adenoma  . Shoulder injection June 2012    left, [place]  . Thumb injection June 2012    left, [place]  . Great toe arthrodesis, interphalangeal joint 1978    Family History  Problem Relation Age of Onset  . Dementia Mother 61    vascular  . Transient ischemic attack Mother    . Dementia Father     after hip fracture  . Hypertension Sister     skin cancer    History  Substance Use Topics  . Smoking status: Former Smoker -- 15 years    Quit date: 03/05/1968  . Smokeless tobacco: Never Used  . Alcohol Use: 0.5 oz/week    1 drink(s) per week      Review of Systems  Constitutional: Negative for fever and chills.  HENT: Positive for neck pain.   Eyes: Negative for visual disturbance.  Cardiovascular: Negative for chest pain and palpitations.  Neurological: Positive for speech difficulty. Negative for dizziness, light-headedness, numbness and headaches.  All other systems reviewed and are negative.    Allergies  Tetracycline hcl  Home Medications   Current Outpatient Rx  Name Route Sig Dispense Refill  . ACETAMINOPHEN ER 650 MG PO TBCR Oral Take 1,300 mg by mouth at bedtime.     Marland Kitchen AMLODIPINE BESYLATE 10 MG PO TABS Oral Take 1 tablet (10 mg total) by mouth daily. 90 tablet 3  . ASPIRIN 81 MG PO TBEC Oral Take 81 mg by mouth daily.      Marland Kitchen COENZYME Q10 30 MG PO CAPS Oral Take 100 mg by mouth 2 (two) times daily.      Marland Kitchen DEXTROMETHORPHAN POLISTIREX ER 30 MG/5ML PO LQCR Oral  Take 60 mg by mouth as needed. Patient used this medication for his cough and cold.    Marland Kitchen DM-GUAIFENESIN ER 30-600 MG PO TB12 Oral Take 1 tablet by mouth every 12 (twelve) hours.    Marland Kitchen GLUCOSAMINE-MSM 1500-500 MG/30ML PO LIQD Oral Take 2 tablets by mouth daily.     . CENTRUM SILVER PO Oral Take 1 tablet by mouth daily.     Marland Kitchen FISH OIL CONCENTRATE 1000 MG PO CAPS Oral Take 2 capsules by mouth daily.     Marland Kitchen TADALAFIL 20 MG PO TABS Oral Take 1 tablet (20 mg total) by mouth daily as needed. 10 tablet 5  . ICAPS AREDS FORMULA PO TABS Oral Take 1 tablet by mouth 2 (two) times daily.     . TESTOSTERONE 4 MG/24HR TD PT24 Transdermal Place 1 patch onto the skin daily. 90 patch 3    BP 156/96  Pulse 86  Temp(Src) 97.6 F (36.4 C) (Oral)  Resp 20  SpO2 97%  Physical Exam  Nursing note  and vitals reviewed. Constitutional: He is oriented to person, place, and time. He appears well-developed and well-nourished.  HENT:  Head: Normocephalic and atraumatic.  Neck: Neck supple.       Moderate pain in back of neck no bruit  Cardiovascular: Normal rate, regular rhythm and normal heart sounds.   Pulmonary/Chest: Effort normal. No stridor. No respiratory distress.  Musculoskeletal: Normal range of motion.  Neurological: He is alert and oriented to person, place, and time. He has normal strength. No cranial nerve deficit. He exhibits normal muscle tone.       No facial droop  Finger to nose test normal  Skin: Skin is warm and dry.  Psychiatric: He has a normal mood and affect. His behavior is normal. Judgment and thought content normal.    ED Course  Procedures (including critical care time) DIAGNOSTIC STUDIES: Oxygen Saturation is 97% on RA, normal by my interpretation.    COORDINATION OF CARE:  Medications  dextromethorphan-guaiFENesin (MUCINEX DM) 30-600 MG per 12 hr tablet (not administered)  dextromethorphan (DELSYM) 30 MG/5ML liquid (not administered)  aspirin tablet 325 mg (not administered)  aspirin 325 MG tablet (not administered)  sodium chloride 0.9 % bolus 500 mL (1000 mL Intravenous Given 01/28/12 2303)  potassium chloride SA (K-DUR,KLOR-CON) CR tablet 20 mEq (20 mEq Oral Given 01/28/12 2303)      Labs Reviewed  CARDIAC PANEL(CRET KIN+CKTOT+MB+TROPI) - Abnormal; Notable for the following:    Total CK 450 (*)    CK, MB 8.8 (*)    All other components within normal limits  BASIC METABOLIC PANEL - Abnormal; Notable for the following:    Potassium 3.3 (*)    Glucose, Bld 141 (*)    GFR calc non Af Amer 72 (*)    GFR calc Af Amer 83 (*)    All other components within normal limits  CBC  DIFFERENTIAL   Ct Head Wo Contrast  01/28/2012  *RADIOLOGY REPORT*  Clinical Data: Possible TIA.  Difficulty verbal rising polyps.  CT HEAD WITHOUT CONTRAST  Technique:   Contiguous axial images were obtained from the base of the skull through the vertex without contrast.  Comparison: None.  Findings: Mild cerebral atrophy.  Patchy low attenuation changes in the deep white matter consistent with small vessel ischemia.  Mild ventricular dilatation consistent with central atrophy.  No mass effect or midline shift.  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 Gray-white matter junctions are distinct.  Basal cisterns are  not effaced.  No evidence of acute intracranial hemorrhage.  IMPRESSION: Mild chronic atrophy and small vessel ischemic changes.  No acute intracranial abnormalities.  Original Report Authenticated By: Marlon Pel, M.D.     1. Transient ischemic attack (TIA)     EKG at time 21:26, shows normal sinus rhythm at a rate of 84. Left axis deviation is present. LDH with slight repoll abnormality is present. There is no significant change compared to EKG from 3/21 2012 other than rate being faster.  MDM  I personally performed the services described in this documentation, which was scribed in my presence. The recorded information has been reviewed and considered.   In my opinion, symptoms are highly suspicious for TIA.  Pt is asymptomatic now.  Head CT shows microvascular disease, no acute infarct, no hemorrhage or tumor.  No mass effect.  I reviewed it myself.  CKMB is up at 8.8, however total is up as well.  Troponin which is more specific is normal.  Pt has no CP, SOB.  Will likely improved with IVF's and time.  K+ is slightly low, given oral replacement provided he passes stroke swallow screen.  Will give him oral replacement and also aspirin.  ABCD2 score of 3.   Spoke to Dr. Chandra Batch at [facility] who initially agreed but no monitored beds at [facility]  Will transfer to [facility] CDU for TIA protocol.  Discussed with Dr. Manus Gunning.         Jose [name]. Oletta Lamas, MD 01/28/12 2308  Jose [name]. Oletta Lamas, MD 01/28/12 2310  Jose [name]. Oletta Lamas, MD 01/28/12  0932  Jose [name]. Shaunae Sieloff, MD 01/31/12 1003

## 2012-01-28 NOTE — ED Notes (Signed)
Pt also c/o neck stiffness.

## 2012-01-28 NOTE — ED Notes (Signed)
Call placed to [facility] 

## 2012-01-29 ENCOUNTER — Observation Stay (HOSPITAL_COMMUNITY): Payer: Medicare Other

## 2012-01-29 ENCOUNTER — Encounter (HOSPITAL_BASED_OUTPATIENT_CLINIC_OR_DEPARTMENT_OTHER): Payer: Self-pay

## 2012-01-29 DIAGNOSIS — G459 Transient cerebral ischemic attack, unspecified: Secondary | ICD-10-CM

## 2012-01-29 LAB — LIPID PANEL
Cholesterol: 186 mg/dL (ref 0–200)
HDL: 36 mg/dL — ABNORMAL LOW (ref >39)
LDL Cholesterol: 86 mg/dL (ref 0–99)
Total CHOL/HDL Ratio: 5.2 RATIO
Triglycerides: 321 mg/dL — ABNORMAL HIGH (ref <150)
VLDL: 64 mg/dL — ABNORMAL HIGH (ref 0–40)

## 2012-01-29 LAB — CARDIAC PANEL(CRET KIN+CKTOT+MB+TROPI)
CK, MB: 6.9 ng/mL (ref 0.3–4.0)
Relative Index: 1.9 (ref 0.0–2.5)
Total CK: 368 U/L — ABNORMAL HIGH (ref 7–232)
Troponin I: <0.3 ng/mL (ref <0.30)

## 2012-01-29 LAB — HEMOGLOBIN A1C
Hgb A1c MFr Bld: 6 % — ABNORMAL HIGH (ref <5.7)
Mean Plasma Glucose: 126 mg/dL — ABNORMAL HIGH (ref <117)

## 2012-01-29 MED ORDER — GADOBENATE DIMEGLUMINE 529 MG/ML IV SOLN
18.0000 mL | Freq: Once | INTRAVENOUS | Status: AC
  Administered 2012-01-29: 18 mL via INTRAVENOUS

## 2012-01-29 NOTE — ED Notes (Signed)
Returned from vascular lab 

## 2012-01-29 NOTE — ED Notes (Signed)
Diet tray ordered 

## 2012-01-29 NOTE — ED Notes (Signed)
Pt transported for dopplers and echo

## 2012-01-29 NOTE — ED Notes (Signed)
Returned from mri 

## 2012-01-29 NOTE — Progress Notes (Signed)
  Echocardiogram 2D Echocardiogram has been performed.  Cathie Beams Deneen 01/29/2012, 9:41 AM

## 2012-01-29 NOTE — ED Provider Notes (Signed)
11:59 AM Patient placed on TIA protocol. Had negative MRI, Dopplers, and echo. Advised close followup with primary care physician. Patient started taking aspirin once a day. Advised immediate return to the closest emergency department for worsening symptoms. Patient voices understanding and is ready for discharge.  Thomasene Lot, PA-C 01/29/12 1240

## 2012-01-29 NOTE — ED Notes (Signed)
Pt in mri 

## 2012-01-29 NOTE — ED Provider Notes (Signed)
Medical screening examination/treatment/procedure(s) were performed by non-physician practitioner and as supervising physician I was immediately available for consultation/collaboration.  Doug Sou, MD 01/29/12 [id number]

## 2012-01-29 NOTE — Progress Notes (Signed)
VASCULAR LAB PRELIMINARY  PRELIMINARY  PRELIMINARY  PRELIMINARY  Carotid duplex completed.    Preliminary report:  Bilateral:  No evidence of hemodynamically significant internal carotid artery stenosis.   Vertebral artery flow is antegrade.     Veta Dambrosia D, RVS 01/29/2012, 10:07 AM

## 2012-01-29 NOTE — ED Provider Notes (Signed)
Medical screening examination/treatment/procedure(s) were performed by non-physician practitioner and as supervising physician I was immediately available for consultation/collaboration.   Jerek Meulemans, MD 01/29/12 1754 

## 2012-01-29 NOTE — ED Provider Notes (Signed)
History     CSN: [id number]  Arrival date & time 01/28/12  2121   First MD Initiated Contact with Patient 01/28/12 2135      Chief Complaint  Patient presents with  . Transient Ischemic Attack    (Consider location/radiation/quality/duration/timing/severity/associated sxs/prior treatment) HPI  coming from [facility] tonight to the CDU for TIA protocol. Was evaluated by Dr. Enos Fling and transferred soon after. Around 9 PM he had slurred speech that lasted about 45 minutes. Denies any other symptoms like weakness dizziness numbness vision problems or headaches. States that his mother had many TIAs in her life. Remote history of seizures. PCP is at [facility].  Past Medical History  Diagnosis Date  . History of CT scan of head 1997    small vessel disease  . Gallstones 07/1996  . History of ETT 05/2002     no EKG changes  . History of MRI of cervical spine 07/15/2002  . Rectal bleeding 12/2002    colonoscopy  . Hypertension     Past Surgical History  Procedure Date  . Skin cancer excision 07/1993    left lateral arm  . Keratosis excision 05/1998    left hand  . Ulnar nerve transfer 12/1999    left  . Colonoscopy w/ polypectomy 04/19/2003    tubular adenoma  . Shoulder injection June 2012    left, [place]  . Thumb injection June 2012    left, [place]  . Great toe arthrodesis, interphalangeal joint 1978    Family History  Problem Relation Age of Onset  . Dementia Mother 51    vascular  . Transient ischemic attack Mother   . Dementia Father     after hip fracture  . Hypertension Sister     skin cancer    History  Substance Use Topics  . Smoking status: Former Smoker -- 15 years    Quit date: 03/05/1968  . Smokeless tobacco: Never Used  . Alcohol Use: 0.5 oz/week    1 drink(s) per week      Review of Systems  Constitutional: Negative.  Negative for fever.  HENT: Negative.   Eyes: Negative.   Respiratory: Negative.  Negative for  chest tightness and shortness of breath.   Cardiovascular: Negative.  Negative for chest pain.  Gastrointestinal: Negative.  Negative for abdominal pain.  Musculoskeletal: Negative.   Skin: Negative.   Neurological: Negative.  Negative for dizziness, syncope, facial asymmetry, speech difficulty, weakness, light-headedness, numbness and headaches.  Psychiatric/Behavioral: Negative.   All other systems reviewed and are negative.    Allergies  Tetracycline hcl  Home Medications   Current Outpatient Rx  Name Route Sig Dispense Refill  . ACETAMINOPHEN ER 650 MG PO TBCR Oral Take 1,300 mg by mouth at bedtime.     Marland Kitchen AMLODIPINE BESYLATE 10 MG PO TABS Oral Take 1 tablet (10 mg total) by mouth daily. 90 tablet 3  . ASPIRIN 81 MG PO TBEC Oral Take 81 mg by mouth daily.      Marland Kitchen COENZYME Q10 30 MG PO CAPS Oral Take 100 mg by mouth 2 (two) times daily.      Marland Kitchen DEXTROMETHORPHAN POLISTIREX ER 30 MG/5ML PO LQCR Oral Take 60 mg by mouth as needed. Patient used this medication for his cough and cold.    Marland Kitchen DM-GUAIFENESIN ER 30-600 MG PO TB12 Oral Take 1 tablet by mouth every 12 (twelve) hours.    Marland Kitchen GLUCOSAMINE-MSM 1500-500 MG/30ML PO LIQD Oral Take 2 tablets by  mouth daily.     . CENTRUM SILVER PO Oral Take 1 tablet by mouth daily.     Marland Kitchen FISH OIL CONCENTRATE 1000 MG PO CAPS Oral Take 2 capsules by mouth daily.     Marland Kitchen TADALAFIL 20 MG PO TABS Oral Take 1 tablet (20 mg total) by mouth daily as needed. 10 tablet 5  . ICAPS AREDS FORMULA PO TABS Oral Take 1 tablet by mouth 2 (two) times daily.     . TESTOSTERONE 4 MG/24HR TD PT24 Transdermal Place 1 patch onto the skin daily. 90 patch 3    BP 130/79  Pulse 60  Temp(Src) 98.9 F (37.2 C) (Oral)  Resp 18  SpO2 95%  Physical Exam  Nursing note and vitals reviewed. Constitutional: He is oriented to person, place, and time. He appears well-developed and well-nourished.  HENT:  Head: Normocephalic.  Eyes: Conjunctivae and EOM are normal. Pupils are equal,  round, and reactive to light.  Neck: Normal range of motion. Neck supple.  Cardiovascular: Normal rate.   Pulmonary/Chest: Effort normal.  Abdominal: Soft.  Musculoskeletal: Normal range of motion.  Neurological: He is alert and oriented to person, place, and time.  Skin: Skin is warm and dry.  Psychiatric: He has a normal mood and affect.    ED Course  Procedures (including critical care time)  Labs Reviewed  CARDIAC PANEL(CRET KIN+CKTOT+MB+TROPI) - Abnormal; Notable for the following:    Total CK 450 (*)    CK, MB 8.8 (*)    All other components within normal limits  BASIC METABOLIC PANEL - Abnormal; Notable for the following:    Potassium 3.3 (*)    Glucose, Bld 141 (*)    GFR calc non Af Amer 72 (*)    GFR calc Af Amer 83 (*)    All other components within normal limits  APTT - Abnormal; Notable for the following:    aPTT 38 (*)    All other components within normal limits  CBC  DIFFERENTIAL  PROTIME-INR  GLUCOSE, POCT (MANUAL RESULT ENTRY)  HEMOGLOBIN A1C  LIPID PANEL  GLUCOSE, POCT (MANUAL RESULT ENTRY)  GLUCOSE, POCT (MANUAL RESULT ENTRY)  GLUCOSE, POCT (MANUAL RESULT ENTRY)   Ct Head Wo Contrast  01/28/2012  *RADIOLOGY REPORT*  Clinical Data: Possible TIA.  Difficulty verbal rising polyps.  CT HEAD WITHOUT CONTRAST  Technique:  Contiguous axial images were obtained from the base of the skull through the vertex without contrast.  Comparison: None.  Findings: Mild cerebral atrophy.  Patchy low attenuation changes in the deep white matter consistent with small vessel ischemia.  Mild ventricular dilatation consistent with central atrophy.  No mass effect or midline shift.  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 Gray-white matter junctions are distinct.  Basal cisterns are not effaced.  No evidence of acute intracranial hemorrhage.  IMPRESSION: Mild chronic atrophy and small vessel ischemic changes.  No acute intracranial abnormalities.  Original Report Authenticated By:  Marlon Pel, M.D.     1. Transient ischemic attack (TIA)   2. Hypokalemia       MDM  12:20am Patient in CDU on TIA protocol. Sent here from [facility] Dr. Oletta Lamas PCP is at [facility]. Had 4-5 minute of slurred speech around 9pm.  Neuro in tact presently.    1330  Report given to Dr. Norlene Campbell.   Labs Reviewed  CARDIAC PANEL(CRET KIN+CKTOT+MB+TROPI) - Abnormal; Notable for the following:    Total CK 450 (*)    CK,  MB 8.8 (*)    All other components within normal limits  BASIC METABOLIC PANEL - Abnormal; Notable for the following:    Potassium 3.3 (*)    Glucose, Bld 141 (*)    GFR calc non Af Amer 72 (*)    GFR calc Af Amer 83 (*)    All other components within normal limits  APTT - Abnormal; Notable for the following:    aPTT 38 (*)    All other components within normal limits  HEMOGLOBIN A1C - Abnormal; Notable for the following:    Hemoglobin A1C 6.0 (*)    Mean Plasma Glucose 126 (*)    All other components within normal limits  LIPID PANEL - Abnormal; Notable for the following:    Triglycerides 321 (*)    HDL 36 (*)    VLDL 64 (*)    All other components within normal limits  CARDIAC PANEL(CRET KIN+CKTOT+MB+TROPI) - Abnormal; Notable for the following:    Total CK 368 (*)    CK, MB 6.9 (*)    All other components within normal limits  CBC  DIFFERENTIAL  PROTIME-INR        Jethro Bastos, NP 01/29/12 1747

## 2012-01-29 NOTE — Discharge Instructions (Signed)
STROKE/TIA DISCHARGE INSTRUCTIONS SMOKING Cigarette smoking nearly doubles your risk of having a stroke & is the single most alterable risk factor  If you smoke or have smoked in the last 12 months, you are advised to quit smoking for your health.  Most of the excess cardiovascular risk related to smoking disappears within a year of stopping.  Ask you doctor about anti-smoking medications  [place] Quit Line: 1-800-QUIT NOW  Free Smoking Cessation Classes [phone]  CHOLESTEROL Know your levels; limit fat & cholesterol in your diet  Lipid Panel     Component Value Date/Time   CHOL 186 01/28/2012 2344   TRIG 321* 01/28/2012 2344   HDL 36* 01/28/2012 2344   CHOLHDL 5.2 01/28/2012 2344   VLDL 64* 01/28/2012 2344   LDLCALC 86 01/28/2012 2344      Many patients benefit from treatment even if their cholesterol is at goal.  Goal: Total Cholesterol (CHOL) less than 160  Goal:  Triglycerides (TRIG) less than 150  Goal:  HDL greater than 40  Goal:  LDL (LDLCALC) less than 100   BLOOD PRESSURE American Stroke Association blood pressure target is less that 120/80 mm/Hg  Your discharge blood pressure is:  BP: 133/86 mmHg  Monitor your blood pressure  Limit your salt and alcohol intake  Many individuals will require more than one medication for high blood pressure  DIABETES (A1c is a blood sugar average for last 3 months) Goal HGBA1c is under 7% (HBGA1c is blood sugar average for last 3 months)  Diabetes: No known diagnosis of diabetes    Lab Results  Component Value Date   HGBA1C 6.0* 01/28/2012     Your HGBA1c can be lowered with medications, healthy diet, and exercise.  Check your blood sugar as directed by your physician  Call your physician if you experience unexplained or low blood sugars.  PHYSICAL ACTIVITY/REHABILITATION Goal is 30 minutes at least 4 days per week    Activity:   No restrictions.  Activity decreases your risk of heart attack and stroke and makes your heart stronger.   It helps control your weight and blood pressure; helps you relax and can improve your mood.  Participate in a regular exercise program.  Talk with your doctor about the best form of exercise for you (dancing, walking, swimming, cycling).  DIET/WEIGHT Goal is to maintain a healthy weight  Your discharge diet is: Low salt. Low fat Your height is:    Your current weight is:  231 Your Body Mass Index (BMI) is:     Following the type of diet specifically designed for you will help prevent another stroke.  Your goal weight range is:   Your goal Body Mass Index (BMI) is 19-24.  Healthy food habits can help reduce 3 risk factors for stroke:  High cholesterol, hypertension, and excess weight.  RESOURCES Stroke/Support Group:  Call [phone]  they meet the 3rd Sunday of the month on the Rehab Unit at [facility], [place] ( no meetings June, July & Aug).  STROKE EDUCATION PROVIDED/REVIEWED AND GIVEN TO PATIENT Stroke warning signs and symptoms How to activate emergency medical system (call 911). Medications prescribed at discharge. Need for follow-up after discharge. Personal risk factors for stroke. Pneumonia vaccine given:   Yes, Date  01/28/2012 Flu vaccine given:   Yes, Date 01/28/12 My questions have been answered, the writing is legible, and I understand these instructions.  I will adhere to these goals & educational materials that have been provided to me after  my discharge from the hospital.

## 2012-01-29 NOTE — ED Notes (Signed)
Arrived via [facility] awake, alert, oriented; NS infusing at [facility] rate via PIV to rgt wrist; presently neurologically intact

## 2012-01-29 NOTE — Progress Notes (Signed)
Observation review is complete. 

## 2012-04-04 ENCOUNTER — Other Ambulatory Visit (HOSPITAL_BASED_OUTPATIENT_CLINIC_OR_DEPARTMENT_OTHER): Payer: Medicare Other | Admitting: Lab

## 2012-04-04 DIAGNOSIS — D472 Monoclonal gammopathy: Secondary | ICD-10-CM

## 2012-04-04 LAB — CBC WITH DIFFERENTIAL/PLATELET
Basophils Absolute: 0.1 10*3/uL (ref 0.0–0.1)
Eosinophils Absolute: 0.1 10*3/uL (ref 0.0–0.5)
HCT: 42.9 % (ref 38.4–49.9)
HGB: 14.5 g/dL (ref 13.0–17.1)
LYMPH%: 30.5 % (ref 14.0–49.0)
MCV: 96.2 fL (ref 79.3–98.0)
MONO%: 8.4 % (ref 0.0–14.0)
NEUT#: 3.6 10*3/uL (ref 1.5–6.5)
NEUT%: 58.9 % (ref 39.0–75.0)
Platelets: 257 10*3/uL (ref 140–400)

## 2012-04-08 LAB — PROTEIN ELECTROPHORESIS, SERUM
Albumin ELP: 63.9 % (ref 55.8–66.1)
Alpha-1-Globulin: 3.5 % (ref 2.9–4.9)
Beta 2: 3.6 % (ref 3.2–6.5)
Beta Globulin: 5.2 % (ref 4.7–7.2)
Total Protein, Serum Electrophoresis: 6.9 g/dL (ref 6.0–8.3)

## 2012-04-08 LAB — COMPREHENSIVE METABOLIC PANEL
Alkaline Phosphatase: 49 U/L (ref 39–117)
BUN: 14 mg/dL (ref 6–23)
CO2: 30 mEq/L (ref 19–32)
Glucose, Bld: 97 mg/dL (ref 70–99)
Total Bilirubin: 0.5 mg/dL (ref 0.3–1.2)

## 2012-04-11 ENCOUNTER — Encounter: Payer: Self-pay | Admitting: Oncology

## 2012-04-11 ENCOUNTER — Ambulatory Visit (HOSPITAL_BASED_OUTPATIENT_CLINIC_OR_DEPARTMENT_OTHER): Payer: Medicare Other | Admitting: Oncology

## 2012-04-11 VITALS — BP 147/87 | HR 94 | Temp 97.2°F | Ht 72.0 in | Wt 229.3 lb

## 2012-04-11 DIAGNOSIS — E785 Hyperlipidemia, unspecified: Secondary | ICD-10-CM

## 2012-04-11 DIAGNOSIS — M1990 Unspecified osteoarthritis, unspecified site: Secondary | ICD-10-CM

## 2012-04-11 DIAGNOSIS — D472 Monoclonal gammopathy: Secondary | ICD-10-CM

## 2012-04-11 DIAGNOSIS — I10 Essential (primary) hypertension: Secondary | ICD-10-CM

## 2012-04-11 HISTORY — DX: Monoclonal gammopathy: D47.2

## 2012-04-11 NOTE — Progress Notes (Signed)
[facility]  [phone] [phone]   OFFICE PROGRESS NOTE   Cc:  Jose Provost, MD  DIAGNOSIS: IgM kappa MGUS.   CURRENT THERAPY: active watchful observation.  INTERVAL HISTORY: Jose [name]. 76 y.o. male returns for regular follow up by himself.  He still has numbness and tingling in the left shoulder and left leg.  He is still able to carry out all activities of daily living.  He is able to work out 3 x/week with 1 hour each.    Patient denies fatigue, headache, visual changes, confusion, drenching night sweats, palpable lymph node swelling, mucositis, odynophagia, dysphagia, nausea vomiting, jaundice, chest pain, palpitation, shortness of breath, dyspnea on exertion, productive cough, gum bleeding, epistaxis, hematemesis, hemoptysis, abdominal pain, abdominal swelling, early satiety, melena, hematochezia, hematuria, skin rash, spontaneous bleeding, joint swelling, joint pain, heat or cold intolerance, bowel bladder incontinence, back pain, focal motor weakness, depression, suicidal or homocidal ideation, feeling hopelessness.   Past Medical History  Diagnosis Date  . History of CT scan of head 1997    small vessel disease  . Gallstones 07/1996  . History of ETT 05/2002     no EKG changes  . History of MRI of cervical spine 07/15/2002  . Rectal bleeding 12/2002    colonoscopy  . Hypertension     Past Surgical History  Procedure Date  . Skin cancer excision 07/1993    left lateral arm  . Keratosis excision 05/1998    left hand  . Ulnar nerve transfer 12/1999    left  . Colonoscopy w/ polypectomy 04/19/2003    tubular adenoma  . Shoulder injection June 2012    left, [place]  . Thumb injection June 2012    left, [place]  . Great toe arthrodesis, interphalangeal joint 1978    Current Outpatient Prescriptions  Medication Sig Dispense Refill  . acetaminophen (PAIN RELIEF) 650 MG CR tablet Take 1,300 mg by mouth at bedtime.       Marland Kitchen  amLODipine (NORVASC) 10 MG tablet Take 1 tablet (10 mg total) by mouth daily.  90 tablet  3  . aspirin ([employer] ASPIRIN EC) 81 MG EC tablet Take 81 mg by mouth daily.        Marland Kitchen co-enzyme Q-10 30 MG capsule Take 100 mg by mouth 2 (two) times daily.        . Glucosamine HCl-MSM (GLUCOSAMINE-MSM) 1500-500 MG/30ML LIQD Take 2 tablets by mouth daily.       . Multiple Vitamins-Minerals (CENTRUM SILVER PO) Take 1 tablet by mouth daily.       . Multiple Vitamins-Minerals (ICAPS) TABS Take 1 tablet by mouth 2 (two) times daily.       . Omega-3 Fatty Acids (FISH OIL CONCENTRATE) 1000 MG CAPS Take 2 capsules by mouth daily.       . tadalafil (CIALIS) 20 MG tablet Take 1 tablet (20 mg total) by mouth daily as needed.  10 tablet  5  . Testosterone 4 MG/24HR PT24 Place 1 patch onto the skin daily.  90 patch  3  . dextromethorphan (DELSYM) 30 MG/5ML liquid Take 60 mg by mouth as needed. Patient used this medication for his cough and cold.      Marland Kitchen dextromethorphan-guaiFENesin (MUCINEX DM) 30-600 MG per 12 hr tablet Take 1 tablet by mouth every 12 (twelve) hours.        ALLERGIES:  is allergic to tetracycline hcl.  REVIEW OF SYSTEMS:  The rest of the 14-point review  of system was negative.   Filed Vitals:   04/11/12 1410  BP: 147/87  Pulse: 94  Temp: 97.2 F (36.2 C)   Wt Readings from Last 3 Encounters:  04/11/12 229 lb 4.8 oz (104.01 kg)  10/12/11 231 lb 4.8 oz (104.917 kg)  04/13/11 229 lb 11.2 oz (104.191 kg)   ECOG Performance status: 0  PHYSICAL EXAMINATION:   General: well-nourished in no acute distress. Eyes: no scleral icterus. ENT: There were no oropharyngeal lesions. Neck was without thyromegaly. Lymphatics: Negative cervical, supraclavicular or axillary adenopathy. Respiratory: lungs were clear bilaterally without wheezing or crackles. Cardiovascular: Regular rate and rhythm, S1/S2, without murmur, rub or gallop. There was no pedal edema. GI: abdomen was soft, flat, nontender, nondistended,  without organomegaly. Muscoloskeletal: no spinal tenderness of palpation of vertebral spine. Skin exam was without echymosis, petichae. There was no pain, purulent discharge in this area. Neuro exam was nonfocal. Patient was able to get on and off exam table without assistance. Gait was normal. Patient was alerted and oriented. Attention was good. Language was appropriate. Mood was normal without depression. Speech was not pressured. Thought content was not tangential.     LABORATORY/RADIOLOGY DATA:  Lab Results  Component Value Date   WBC 6.0 04/04/2012   HGB 14.5 04/04/2012   HCT 42.9 04/04/2012   PLT 257 04/04/2012   GLUCOSE 97 04/04/2012   CHOL 186 01/28/2012   TRIG 321* 01/28/2012   HDL 36* 01/28/2012   LDLDIRECT 120* 08/03/2010   LDLCALC 86 01/28/2012   ALKPHOS 49 04/04/2012   ALT 40 04/04/2012   AST 36 04/04/2012   NA 141 04/04/2012   K 4.4 04/04/2012   CL 104 04/04/2012   CREATININE 0.90 04/04/2012   BUN 14 04/04/2012   CO2 30 04/04/2012   PSA 0.37 02/27/2011   INR 1.04 01/28/2012   HGBA1C 6.0* 01/28/2012     ASSESSMENT AND PLAN:   1. MGUS:  Resolved at this time.  He does not have detectable M-spike.  He does not have anemia, renal failure, hypercalcemia to suggestive active myeloma.   2. Hypertension: Well controlled on amlodipine per PCP. 3. Hyperlipidemia: On fish oil per PCP. 4. Osteoarthritis: On Tylenol Arthritis per PCP and glucosamine. Left LE symptoms were extensively worked up without obvious etiology. Again, it's not due to plasmacell dyscrasia as he does not have myeloma.    Dispo:  I discharged Jose [name] from the [facility] as he does not have any hem/onc diagnosis.  His MGUS has resolved.  I recommend to PCP to check his CBC and CMET about one or twice a year.  In the future, if her develops anemia, renal failure, hypercalcemia, then he can return to the [facility] for further work up.  Our service is always available in the future if the need ever arises.  Jose [name] expressed informed  understanding and wished to proceed with stated plan.   Thank you for this referral.     The length of time of the face-to-face encounter was 10 minutes. More than 50% of time was spent counseling and coordination of care.

## 2012-04-11 NOTE — Patient Instructions (Addendum)
1.  History of abnormal protein:  No longer present.  There is no indication of multiple myeloma since there is no anemia, kidney failure, or elevated calcium.  2.  Plan:  Discharge to primary care physician.  Please have your primary doctor check your blood count and metabolic panel about twice a year.  In the future, if you develop anemia, kidney failure, or elevated calcium, we will certainly evaluate again.    Thank you.

## 2014-10-12 ENCOUNTER — Observation Stay (HOSPITAL_BASED_OUTPATIENT_CLINIC_OR_DEPARTMENT_OTHER)
Admission: EM | Admit: 2014-10-12 | Discharge: 2014-10-13 | Disposition: A | Discharge status: Home / Self Care | Payer: Medicare Other | Attending: Internal Medicine | Admitting: Internal Medicine

## 2014-10-12 ENCOUNTER — Encounter (HOSPITAL_BASED_OUTPATIENT_CLINIC_OR_DEPARTMENT_OTHER): Payer: Self-pay | Admitting: *Deleted

## 2014-10-12 ENCOUNTER — Emergency Department (HOSPITAL_BASED_OUTPATIENT_CLINIC_OR_DEPARTMENT_OTHER): Payer: Medicare Other

## 2014-10-12 DIAGNOSIS — I10 Essential (primary) hypertension: Secondary | ICD-10-CM | POA: Diagnosis not present

## 2014-10-12 DIAGNOSIS — R001 Bradycardia, unspecified: Secondary | ICD-10-CM | POA: Insufficient documentation

## 2014-10-12 DIAGNOSIS — Z8673 Personal history of transient ischemic attack (TIA), and cerebral infarction without residual deficits: Secondary | ICD-10-CM | POA: Diagnosis not present

## 2014-10-12 DIAGNOSIS — Z85828 Personal history of other malignant neoplasm of skin: Secondary | ICD-10-CM | POA: Insufficient documentation

## 2014-10-12 DIAGNOSIS — Z7982 Long term (current) use of aspirin: Secondary | ICD-10-CM | POA: Diagnosis not present

## 2014-10-12 DIAGNOSIS — D472 Monoclonal gammopathy: Secondary | ICD-10-CM | POA: Insufficient documentation

## 2014-10-12 DIAGNOSIS — Z6832 Body mass index (BMI) 32.0-32.9, adult: Secondary | ICD-10-CM | POA: Insufficient documentation

## 2014-10-12 DIAGNOSIS — R251 Tremor, unspecified: Secondary | ICD-10-CM | POA: Insufficient documentation

## 2014-10-12 DIAGNOSIS — I6529 Occlusion and stenosis of unspecified carotid artery: Secondary | ICD-10-CM | POA: Insufficient documentation

## 2014-10-12 DIAGNOSIS — R42 Dizziness and giddiness: Secondary | ICD-10-CM

## 2014-10-12 DIAGNOSIS — Z79899 Other long term (current) drug therapy: Secondary | ICD-10-CM | POA: Diagnosis not present

## 2014-10-12 DIAGNOSIS — Z87891 Personal history of nicotine dependence: Secondary | ICD-10-CM | POA: Insufficient documentation

## 2014-10-12 DIAGNOSIS — G629 Polyneuropathy, unspecified: Secondary | ICD-10-CM | POA: Diagnosis not present

## 2014-10-12 DIAGNOSIS — E669 Obesity, unspecified: Secondary | ICD-10-CM | POA: Diagnosis not present

## 2014-10-12 DIAGNOSIS — G459 Transient cerebral ischemic attack, unspecified: Principal | ICD-10-CM | POA: Insufficient documentation

## 2014-10-12 HISTORY — DX: Unspecified convulsions: R56.9

## 2014-10-12 HISTORY — DX: Transient cerebral ischemic attack, unspecified: G45.9

## 2014-10-12 HISTORY — DX: Unspecified malignant neoplasm of skin, unspecified: C44.90

## 2014-10-12 LAB — CBC WITH DIFFERENTIAL/PLATELET
BASOS ABS: 0 10*3/uL (ref 0.0–0.1)
BASOS PCT: 0 % (ref 0–1)
EOS ABS: 0.1 10*3/uL (ref 0.0–0.7)
Eosinophils Relative: 1 % (ref 0–5)
HEMATOCRIT: 40.2 % (ref 39.0–52.0)
Hemoglobin: 13.7 g/dL (ref 13.0–17.0)
Lymphocytes Relative: 18 % (ref 12–46)
Lymphs Abs: 1.4 10*3/uL (ref 0.7–4.0)
MCH: 32.2 pg (ref 26.0–34.0)
MCHC: 34.1 g/dL (ref 30.0–36.0)
MCV: 94.4 fL (ref 78.0–100.0)
MONO ABS: 0.4 10*3/uL (ref 0.1–1.0)
Monocytes Relative: 5 % (ref 3–12)
NEUTROS ABS: 5.7 10*3/uL (ref 1.7–7.7)
Neutrophils Relative %: 76 % (ref 43–77)
Platelets: 207 10*3/uL (ref 150–400)
RBC: 4.26 MIL/uL (ref 4.22–5.81)
RDW: 12.8 % (ref 11.5–15.5)
WBC: 7.5 10*3/uL (ref 4.0–10.5)

## 2014-10-12 LAB — COMPREHENSIVE METABOLIC PANEL
ALBUMIN: 4.1 g/dL (ref 3.5–5.2)
ALK PHOS: 48 U/L (ref 39–117)
ALT: 41 U/L (ref 0–53)
ANION GAP: 14 (ref 5–15)
AST: 37 U/L (ref 0–37)
BILIRUBIN TOTAL: 0.5 mg/dL (ref 0.3–1.2)
BUN: 21 mg/dL (ref 6–23)
CO2: 27 mEq/L (ref 19–32)
Calcium: 9.4 mg/dL (ref 8.4–10.5)
Chloride: 100 mEq/L (ref 96–112)
Creatinine, Ser: 0.8 mg/dL (ref 0.50–1.35)
GFR calc Af Amer: >90 mL/min (ref >90)
GFR calc non Af Amer: 85 mL/min — ABNORMAL LOW (ref >90)
Glucose, Bld: 157 mg/dL — ABNORMAL HIGH (ref 70–99)
Potassium: 3.8 mEq/L (ref 3.7–5.3)
Sodium: 141 mEq/L (ref 137–147)
Total Protein: 7.5 g/dL (ref 6.0–8.3)

## 2014-10-12 LAB — GLUCOSE, CAPILLARY: Glucose-Capillary: 120 mg/dL — ABNORMAL HIGH (ref 70–99)

## 2014-10-12 LAB — TROPONIN I

## 2014-10-12 MED ORDER — ENOXAPARIN SODIUM 40 MG/0.4ML ~~LOC~~ SOLN
40.0000 mg | SUBCUTANEOUS | Status: DC
  Administered 2014-10-12: 40 mg via SUBCUTANEOUS
  Filled 2014-10-12: qty 0.4

## 2014-10-12 MED ORDER — ASPIRIN 325 MG PO TABS
325.0000 mg | ORAL_TABLET | Freq: Every day | ORAL | Status: DC
  Administered 2014-10-12 – 2014-10-13 (×2): 325 mg via ORAL
  Filled 2014-10-12 (×2): qty 1

## 2014-10-12 MED ORDER — STROKE: EARLY STAGES OF RECOVERY BOOK
Freq: Once | Status: AC
  Administered 2014-10-12: 22:00:00
  Filled 2014-10-12: qty 1

## 2014-10-12 MED ORDER — HYDRALAZINE HCL 20 MG/ML IJ SOLN: 10.0000 mg | Freq: Three times a day (TID) | INTRAMUSCULAR | Status: DC | PRN

## 2014-10-12 MED ORDER — SODIUM CHLORIDE 0.9 % IV SOLN
INTRAVENOUS | Status: DC
  Administered 2014-10-12: 1000 mL via INTRAVENOUS

## 2014-10-12 MED ORDER — DM-GUAIFENESIN ER 30-600 MG PO TB12
1.0000 | ORAL_TABLET | Freq: Two times a day (BID) | ORAL | Status: DC
  Administered 2014-10-12 – 2014-10-13 (×2): 1 via ORAL
  Filled 2014-10-12 (×2): qty 1

## 2014-10-12 MED ORDER — OMEGA-3-ACID ETHYL ESTERS 1 G PO CAPS
1.0000 g | ORAL_CAPSULE | Freq: Every day | ORAL | Status: DC
  Administered 2014-10-13: 1 g via ORAL
  Filled 2014-10-12: qty 1

## 2014-10-12 NOTE — ED Provider Notes (Signed)
CSN: [id number]     Arrival date & time 10/12/14  4967 History   First MD Initiated Contact with Patient 10/12/14 1112     Chief Complaint  Patient presents with  . Dizziness     (Consider location/radiation/quality/duration/timing/severity/associated sxs/prior Treatment) Patient is a 78 y.o. male presenting with dizziness. The history is provided by the patient.  Dizziness Associated symptoms: no chest pain, no headaches, no nausea, no shortness of breath and no vomiting    patient was awake and was feeling normal until around 7:30 in the morning he was sitting in bed drinking a cup of coffee. Talking on the phone to his daughter. Sudden onset of lightheadedness and dizziness without vertigo and then had about 15 minutes of right hand shaking and weakness could not hold onto the phone. Following that the patient got up and was walking felt as if his gait was abnormal and as if he was going to fall over. Patient with a past history of TIAs never had symptoms exactly like this. In addition patient had seizure problems from mid 60s to mid 70s none since that had a similar prodrome as far as the lightheadedness went. Patient still feeling of lightheaded when he was moved over to radiology for the CAT scan. No persistent extremity weakness. No headache. No fevers. Patient was feeling well otherwise.  Past Medical History  Diagnosis Date  . History of CT scan of head 1997    small vessel disease  . Gallstones 07/1996  . History of ETT 05/2002     no EKG changes  . History of MRI of cervical spine 07/15/2002  . Rectal bleeding 12/2002    colonoscopy  . Hypertension   . MGUS (monoclonal gammopathy of unknown significance) 04/11/2012  . Skin cancer   . Seizures     last seizure 1976  . TIA (transient ischemic attack)    Past Surgical History  Procedure Laterality Date  . Skin cancer excision  07/1993    left lateral arm  . Keratosis excision  05/1998    left hand  . Ulnar nerve transfer   12/1999    left  . Colonoscopy w/ polypectomy  04/19/2003    tubular adenoma  . Shoulder injection  June 2012    left, [place]  . Thumb injection  June 2012    left, [place]  . Great toe arthrodesis, interphalangeal joint  1978  . Tonsillectomy    . Adenoidectomy     Family History  Problem Relation Age of Onset  . Dementia Mother 23    vascular  . Transient ischemic attack Mother   . Dementia Father     after hip fracture  . Hypertension Sister     skin cancer   History  Substance Use Topics  . Smoking status: Former Smoker -- 15 years    Quit date: 03/05/1968  . Smokeless tobacco: Never Used  . Alcohol Use: 0.5 oz/week    1 Not specified per week    Review of Systems  Constitutional: Negative for fever.  HENT: Negative for congestion.   Eyes: Negative for visual disturbance.  Respiratory: Negative for shortness of breath.   Cardiovascular: Negative for chest pain.  Gastrointestinal: Negative for nausea, vomiting and abdominal pain.  Genitourinary: Negative for dysuria.  Musculoskeletal: Negative for myalgias.  Skin: Negative for rash.  Neurological: Positive for dizziness, weakness and light-headedness. Negative for facial asymmetry, speech difficulty, numbness and headaches.  Hematological: Does not bruise/bleed easily.  Allergies  Tetracycline hcl  Home Medications   Prior to Admission medications   Medication Sig Start Date End Date Taking? Authorizing Provider  acetaminophen (PAIN RELIEF) 650 MG CR tablet Take 1,300 mg by mouth at bedtime.    Yes Historical Provider, MD  amLODipine (NORVASC) 10 MG tablet Take 1 tablet (10 mg total) by mouth daily. 07/10/11  Yes Candelaria Celeste, MD  aspirin ([employer] ASPIRIN EC) 81 MG EC tablet Take 81 mg by mouth daily.     Yes Historical Provider, MD  GABAPENTIN PO Take by mouth 2 (two) times daily.   Yes Historical Provider, MD  LABETALOL HCL PO Take by mouth.   Yes Historical Provider, MD  LOSARTAN POTASSIUM PO Take by mouth.    Yes Historical Provider, MD  Multiple Vitamins-Minerals (CENTRUM SILVER PO) Take 1 tablet by mouth daily.    Yes Historical Provider, MD  Multiple Vitamins-Minerals (ICAPS) TABS Take 1 tablet by mouth 2 (two) times daily.    Yes Historical Provider, MD  Omega-3 Fatty Acids (FISH OIL CONCENTRATE) 1000 MG CAPS Take 2 capsules by mouth daily.    Yes Historical Provider, MD  co-enzyme Q-10 30 MG capsule Take 100 mg by mouth 2 (two) times daily.      Historical Provider, MD  dextromethorphan (DELSYM) 30 MG/5ML liquid Take 60 mg by mouth as needed. Patient used this medication for his cough and cold.    Historical Provider, MD  dextromethorphan-guaiFENesin (MUCINEX DM) 30-600 MG per 12 hr tablet Take 1 tablet by mouth every 12 (twelve) hours.    Historical Provider, MD  Glucosamine HCl-MSM (GLUCOSAMINE-MSM) 1500-500 MG/30ML LIQD Take 2 tablets by mouth daily.     Historical Provider, MD  tadalafil (CIALIS) 20 MG tablet Take 1 tablet (20 mg total) by mouth daily as needed. 07/26/11   Candelaria Celeste, MD  Testosterone 4 MG/24HR PT24 Place 1 patch onto the skin daily. 07/10/11   Candelaria Celeste, MD   BP 121/79 mmHg  Pulse 58  Temp(Src) 97.6 F (36.4 C) (Oral)  Resp 17  Ht 6' (1.829 m)  Wt 225 lb (102.059 kg)  BMI 30.51 kg/m2  SpO2 94% Physical Exam  Constitutional: He is oriented to person, place, and time. He appears well-developed and well-nourished. No distress.  HENT:  Head: Normocephalic and atraumatic.  Mouth/Throat: Oropharynx is clear and moist.  Eyes: EOM are normal. Pupils are equal, round, and reactive to light.  Neck: Normal range of motion.  Cardiovascular: Normal rate and normal heart sounds.   No murmur heard. Pulmonary/Chest: Effort normal and breath sounds normal. No respiratory distress.  Abdominal: Soft. Bowel sounds are normal. There is no tenderness.  Musculoskeletal: Normal range of motion.  Neurological: He is alert and oriented to person, place, and time. No cranial nerve deficit.  He exhibits normal muscle tone. Coordination normal.  Skin: Skin is warm. No rash noted.  Nursing note and vitals reviewed.   ED Course  Procedures (including critical care time) Labs Review Labs Reviewed  COMPREHENSIVE METABOLIC PANEL - Abnormal; Notable for the following:    Glucose, Bld 157 (*)    GFR calc non Af Amer 85 (*)    All other components within normal limits  CBC WITH DIFFERENTIAL    Imaging Review Ct Head Wo Contrast  10/12/2014   CLINICAL DATA:  Severe dizziness starting this morning, history of TIA 3 years ago  EXAM: CT HEAD WITHOUT CONTRAST  TECHNIQUE: Contiguous axial images were obtained from the base of the skull through  the vertex without intravenous contrast.  COMPARISON:  01/29/2012 and 01/28/2012  FINDINGS: No skull fracture is noted. Paranasal sinuses and mastoid air cells are unremarkable.  No intracranial hemorrhage, mass effect or midline shift. Stable mild cerebral atrophy. Stable mild periventricular and patchy subcortical chronic white matter disease. No acute cortical infarction. No mass lesion is noted on this unenhanced scan.  IMPRESSION: No acute intracranial abnormality. Stable atrophy and chronic mild white matter disease. No definite acute cortical infarction.   Electronically Signed   By: Lahoma Crocker M.D.   On: 10/12/2014 10:37     EKG Interpretation   Date/Time:  Tuesday October 12 2014 12:41:12 EST Ventricular Rate:  62 PR Interval:  192 QRS Duration: 96 QT Interval:  412 QTC Calculation: 418 R Axis:   -37 Text Interpretation:  Normal sinus rhythm Left axis deviation Minimal  voltage criteria for LVH, may be normal variant Nonspecific T wave  abnormality Abnormal ECG No significant change since last tracing  Confirmed by Genova Kiner  MD, Shyloh Krinke ([id number]) on 10/12/2014 12:52:44 PM      MDM   Final diagnoses:  Dizziness  Transient cerebral ischemia, unspecified transient cerebral ischemia type    Patient with symptoms not inconsistent  with a TIA. Discuss with Dr. Doy Mince neuro hospitalist Cohen she agrees recommends medicine admission. MRI can be done during that admission. Patient with history of TIAs and not like this. Today's event did have some symptoms similar to the prodrome prior to generalized seizures that occurred in the mid 31s to mid 70s. Patient has not any seizure since that time. Main concern on symptoms today was the right arm weakness. Lasted about 15 minutes. Patient's dizziness had resolved here. Patient's workup without any significant findings. Patient does state that he's had evaluation of his carotid arteries by Doppler studies in the past but not recently.    Fredia Sorrow, MD 10/12/14 [phone]

## 2014-10-12 NOTE — ED Notes (Signed)
Patient transported to CT 

## 2014-10-12 NOTE — H&P (Signed)
[facility] History and Physical  Jose [name]. WNU:[id number] DOB: [birth year] DOA: 10/12/2014  Referring physician: ED physician.  PCP: Karlene Einstein, MD   Chief Complaint: dizziness, right arm tremors.   HPI: Jose [name]. is a 78 y.o. male with PMH significant for HTN, MGUS, history of seizure last 1976 who presents complaining of dizziness, lightheaded, right arm tremors. Symptoms started the morning of admission whole patient was sitting in the bed, speaking over the phone with daughter. Patient was not feeling right, wife thought he has been acting "funny". He when to kitchen to eat breakfast and he continue to feel dizzy. He denies speech problems, vision changes, no chest pain or dyspnea. No diarrhea.   He relates two prior episodes of difficulty with speech, last year and anther episode were he had bilateral peripheral vision loss last summer. He didn't get  medical attention at that time.  He takes baby aspirin every day.  He report that dizziness has resolved. He is feeling better. Dizziness lasted for 3 to 4 hours.   Review of Systems:  Negative, except as per HPI.   Past Medical History  Diagnosis Date  . History of CT scan of head 1997    small vessel disease  . Gallstones 07/1996  . History of ETT 05/2002     no EKG changes  . History of MRI of cervical spine 07/15/2002  . Rectal bleeding 12/2002    colonoscopy  . Hypertension   . MGUS (monoclonal gammopathy of unknown significance) 04/11/2012  . Skin cancer   . Seizures     last seizure 1976  . TIA (transient ischemic attack)    Past Surgical History  Procedure Laterality Date  . Skin cancer excision  07/1993    left lateral arm  . Keratosis excision  05/1998    left hand  . Ulnar nerve transfer  12/1999    left  . Colonoscopy w/ polypectomy  04/19/2003    tubular adenoma  . Shoulder injection  June 2012    left, [place]  . Thumb injection  June 2012    left, [place]  . Great toe arthrodesis,  interphalangeal joint  1978  . Tonsillectomy    . Adenoidectomy     Social History:  reports that he quit smoking about 46 years ago. He has never used smokeless tobacco. He reports that he drinks about 0.5 oz of alcohol per week. He reports that he does not use illicit drugs.  Allergies  Allergen Reactions  . Tetracycline Hcl     REACTION: rash: fixed drug reaction    Family History  Problem Relation Age of Onset  . Dementia Mother 80    vascular  . Transient ischemic attack Mother   . Dementia Father     after hip fracture  . Hypertension Sister     skin cancer     Prior to Admission medications   Medication Sig Start Date End Date Taking? Authorizing Provider  acetaminophen (PAIN RELIEF) 650 MG CR tablet Take 1,300 mg by mouth at bedtime.    Yes Historical Provider, MD  amLODipine (NORVASC) 10 MG tablet Take 1 tablet (10 mg total) by mouth daily. 07/10/11  Yes Candelaria Celeste, MD  aspirin ([employer] ASPIRIN EC) 81 MG EC tablet Take 81 mg by mouth daily.     Yes Historical Provider, MD  GABAPENTIN PO Take by mouth 2 (two) times daily.   Yes Historical Provider, MD  LABETALOL HCL PO Take by mouth.  Yes Historical Provider, MD  LOSARTAN POTASSIUM PO Take by mouth.   Yes Historical Provider, MD  Multiple Vitamins-Minerals (CENTRUM SILVER PO) Take 1 tablet by mouth daily.    Yes Historical Provider, MD  Multiple Vitamins-Minerals (ICAPS) TABS Take 1 tablet by mouth 2 (two) times daily.    Yes Historical Provider, MD  Omega-3 Fatty Acids (FISH OIL CONCENTRATE) 1000 MG CAPS Take 2 capsules by mouth daily.    Yes Historical Provider, MD  co-enzyme Q-10 30 MG capsule Take 100 mg by mouth 2 (two) times daily.      Historical Provider, MD  dextromethorphan (DELSYM) 30 MG/5ML liquid Take 60 mg by mouth as needed. Patient used this medication for his cough and cold.    Historical Provider, MD  dextromethorphan-guaiFENesin (MUCINEX DM) 30-600 MG per 12 hr tablet Take 1 tablet by mouth every 12 (twelve)  hours.    Historical Provider, MD  Glucosamine HCl-MSM (GLUCOSAMINE-MSM) 1500-500 MG/30ML LIQD Take 2 tablets by mouth daily.     Historical Provider, MD  tadalafil (CIALIS) 20 MG tablet Take 1 tablet (20 mg total) by mouth daily as needed. 07/26/11   Candelaria Celeste, MD  Testosterone 4 MG/24HR PT24 Place 1 patch onto the skin daily. 07/10/11   Candelaria Celeste, MD   Physical Exam: Filed Vitals:   10/12/14 1430 10/12/14 1500 10/12/14 1530 10/12/14 1600  BP: 130/77 121/79 117/73 126/84  Pulse: 57 58 61 63  Temp:      TempSrc:      Resp: 14 17 16 15   Height:      Weight:      SpO2: 97% 94% 96% 96%    Wt Readings from Last 3 Encounters:  10/12/14 102.059 kg (225 lb)  04/11/12 104.01 kg (229 lb 4.8 oz)  10/12/11 104.917 kg (231 lb 4.8 oz)    General:  Appears calm and comfortable Eyes: PERRL, normal lids, irises & conjunctiva ENT: grossly normal hearing, lips & tongue Neck: no LAD, masses or thyromegaly Cardiovascular: RRR, no m/r/g. No LE edema. Telemetry: SR, no arrhythmias  Respiratory: CTA bilaterally, no w/r/r. Normal respiratory effort. Abdomen: soft, ntnd Skin: no rash or induration seen on limited exam Musculoskeletal: grossly normal tone BUE/BLE Psychiatric: grossly normal mood and affect, speech fluent and appropriate Neurologic: grossly non-focal. Alert oriented, motor strength 5/5, speech clear.           Labs on Admission:  Basic Metabolic Panel:  Recent Labs Lab 10/12/14 1230  NA 141  K 3.8  CL 100  CO2 27  GLUCOSE 157*  BUN 21  CREATININE 0.80  CALCIUM 9.4   Liver Function Tests:  Recent Labs Lab 10/12/14 1230  AST 37  ALT 41  ALKPHOS 48  BILITOT 0.5  PROT 7.5  ALBUMIN 4.1   No results for input(s): LIPASE, AMYLASE in the last 168 hours. No results for input(s): AMMONIA in the last 168 hours. CBC:  Recent Labs Lab 10/12/14 1230  WBC 7.5  NEUTROABS 5.7  HGB 13.7  HCT 40.2  MCV 94.4  PLT 207   Cardiac Enzymes: No results for input(s):  CKTOTAL, CKMB, CKMBINDEX, TROPONINI in the last 168 hours.  BNP (last 3 results) No results for input(s): PROBNP in the last 8760 hours. CBG: No results for input(s): GLUCAP in the last 168 hours.  Radiological Exams on Admission: Ct Head Wo Contrast  10/12/2014   CLINICAL DATA:  Severe dizziness starting this morning, history of TIA 3 years ago  EXAM: CT HEAD WITHOUT CONTRAST  TECHNIQUE: Contiguous axial images were obtained from the base of the skull through the vertex without intravenous contrast.  COMPARISON:  01/29/2012 and 01/28/2012  FINDINGS: No skull fracture is noted. Paranasal sinuses and mastoid air cells are unremarkable.  No intracranial hemorrhage, mass effect or midline shift. Stable mild cerebral atrophy. Stable mild periventricular and patchy subcortical chronic white matter disease. No acute cortical infarction. No mass lesion is noted on this unenhanced scan.  IMPRESSION: No acute intracranial abnormality. Stable atrophy and chronic mild white matter disease. No definite acute cortical infarction.   Electronically Signed   By: Lahoma Crocker M.D.   On: 10/12/2014 10:37    EKG: Independently reviewed. Sinus non specific T wave abnormalities.   Assessment/Plan Active Problems:   TIA (transient ischemic attack)  1-Dizziness, near syncope, right arm tremors;  Admit to telemetry, rule out stroke, TIA work up. Check orthostatic vitals. Monitor on telemetry. Cycle cardiac enzymes.  Continue with aspirin for now.  Neurology consulted.  MRI, HbA1c, lipid panel, carotid doppler, ECHO ordered.   2-HTN; permissive HTN in case this is TIA, Stroke. Will need to find home doses of BP medications. Patient doesn't know. IV PRN hydralazine. Check orthostatic vitals.   3-neuropathy; on gabapentin, unknown doses.  4-history of seizure; not on medications. Follow up neurology recommendation.   Code Status: Full Code.  DVT Prophylaxis:lovenox Family Communication: Care discussed with wife  who was at bedside.  Disposition Plan: expect less than 2 nights.   Time spent: 75 minutes.   Niel Hummer A [facility] Pager [phone]

## 2014-10-12 NOTE — ED Notes (Signed)
Monitor reads multi PVC's. Checking on pt he states he was standing and urinating and stated he was not dizzy on standing. Pt asymptomatic.

## 2014-10-12 NOTE — ED Notes (Signed)
Pt stood to use urinal in room- reports dizziness has resolved at this time

## 2014-10-12 NOTE — ED Notes (Signed)
MD at bedside. 

## 2014-10-12 NOTE — ED Notes (Signed)
EDP Zackowski updated on pt's sx

## 2014-10-12 NOTE — Consult Note (Signed)
                    NEURO HOSPITALIST CONSULT NOTE    Reason for Consult: transient right arm "tiredness", dizziness, imbalance.  HPI:                                                                                                                                          Jose S Palmatier Jr. is an 78 y.o. male with a past medical history significant for HTN, TIA, skin cancer,MGUS, peripheral neuropathy, seizures (last seizure 1976), admitted to [facility] for further evaluation of the above stated symptoms. He stated that this morning around 7:15 he was sitting in the bed with his wife, talking to his daughter on the phone when suddenly " something was wrong and I couldn't keep the phone on the right arm straight, the arm was struggling, moving around and feeling tired". He said that he got up from bed, went to the kitchen to eat something and the right arm was back to normal. However, while walking to the kitchen he felt " funny, dizzy, my balance was completely off". The whole episode lasted for about 3 hours and he said that he was not really confused or disoriented, did not have HA, vertigo, double vision, difficulty swallowing, slurred speech, language or vision impairment. At this moment, he feels back to his baseline. CT brain showed no acute intracranial abnormality. Importantly, [name] mentions that this past summer he was driving to the beach when he sustained a transient episode of alteration of vision in the left visual field that lasted for 5 minutes. He takes aspirin 81 mg daily.  Past Medical History  Diagnosis Date  . History of CT scan of head 1997    small vessel disease  . Gallstones 07/1996  . History of ETT 05/2002     no EKG changes  . History of MRI of cervical spine 07/15/2002  . Rectal bleeding 12/2002    colonoscopy  . Hypertension   . MGUS (monoclonal gammopathy of unknown significance) 04/11/2012  . Skin cancer   . Seizures     last seizure 1976  . TIA (transient  ischemic attack)     Past Surgical History  Procedure Laterality Date  . Skin cancer excision  07/1993    left lateral arm  . Keratosis excision  05/1998    left hand  . Ulnar nerve transfer  12/1999    left  . Colonoscopy w/ polypectomy  04/19/2003    tubular adenoma  . Shoulder injection  June 2012    left, [place]  . Thumb injection  June 2012    left, Wainer  . Great toe arthrodesis, interphalangeal joint  1978  . Tonsillectomy    . Adenoidectomy      Family History  Problem Relation Age of Onset  . Dementia Mother 94      vascular  . Transient ischemic attack Mother   . Dementia Father     after hip fracture  . Hypertension Sister     skin cancer    Family History: as above   Social History:  reports that he quit smoking about 46 years ago. He has never used smokeless tobacco. He reports that he drinks about 0.5 oz of alcohol per week. He reports that he does not use illicit drugs.  Allergies  Allergen Reactions  . Tetracycline Hcl     REACTION: rash: fixed drug reaction    MEDICATIONS:                                                                                                                     Scheduled: .  stroke: mapping our early stages of recovery book   Does not apply Once  . aspirin  325 mg Oral Daily  . dextromethorphan-guaiFENesin  1 tablet Oral Q12H  . enoxaparin (LOVENOX) injection  40 mg Subcutaneous Q24H  . [START ON 10/13/2014] omega-3 acid ethyl esters  1 g Oral Daily     ROS:                                                                                                                                       History obtained from the patient and chart review.  General ROS: negative for - chills, fatigue, fever, night sweats, weight gain or weight loss Psychological ROS: negative for - behavioral disorder, hallucinations, memory difficulties, mood swings or suicidal ideation Ophthalmic ROS: negative for - blurry vision, double vision, eye  pain or loss of vision ENT ROS: negative for - epistaxis, nasal discharge, oral lesions, sore throat, tinnitus or vertigo Allergy and Immunology ROS: negative for - hives or itchy/watery eyes Hematological and Lymphatic ROS: negative for - bleeding problems, bruising or swollen lymph nodes Endocrine ROS: negative for - galactorrhea, hair pattern changes, polydipsia/polyuria or temperature intolerance Respiratory ROS: negative for - cough, hemoptysis, shortness of breath or wheezing Cardiovascular ROS: negative for - chest pain, dyspnea on exertion, edema or irregular heartbeat Gastrointestinal ROS: negative for - abdominal pain, diarrhea, hematemesis, nausea/vomiting or stool incontinence Genito-Urinary ROS: negative for - dysuria, hematuria, incontinence or urinary frequency/urgency Musculoskeletal ROS: negative for - joint swelling or muscular weakness Neurological ROS: as noted in HPI Dermatological ROS: negative for rash and skin lesion changes  Physical exam: pleasant male in   no apparent distress. Blood pressure 128/87, pulse 80, temperature 97.5 F (36.4 C), temperature source Oral, resp. rate 16, height 6' (1.829 m), weight 102.059 kg (225 lb), SpO2 95 %. Head: normocephalic. Neck: supple, no bruits, no JVD. Cardiac: no murmurs. Lungs: clear. Abdomen: soft, no tender, no mass. Extremities: no edema. Neurologic Examination:                                                                                                      General: Mental Status: Alert, oriented, thought content appropriate.  Speech fluent without evidence of aphasia.  Able to follow 3 step commands without difficulty. Cranial Nerves: II: Discs flat bilaterally; Visual fields grossly normal, pupils equal, round, reactive to light and accommodation III,IV, VI: ptosis not present, extra-ocular motions intact bilaterally V,VII: smile symmetric, facial light touch sensation normal bilaterally VIII: hearing normal  bilaterally IX,X: gag reflex present XI: bilateral shoulder shrug XII: midline tongue extension without atrophy or fasciculations  Motor: Right : Upper extremity   5/5    Left:     Upper extremity   5/5  Lower extremity   5/5     Lower extremity   5/5 Tone and bulk:normal tone throughout; no atrophy noted Sensory: Pinprick and light touch intact throughout, bilaterally Deep Tendon Reflexes:  Right: Upper Extremity   Left: Upper extremity   biceps (C-5 to C-6) 2/4   biceps (C-5 to C-6) 2/4 tricep (C7) 2/4    triceps (C7) 2/4 Brachioradialis (C6) 2/4  Brachioradialis (C6) 2/4  Lower Extremity Lower Extremity  quadriceps (L-2 to L-4) 2/4   quadriceps (L-2 to L-4) 2/4 Achilles (S1) 2/4   Achilles (S1) 2/4  Plantars: Right: downgoing   Left: downgoing Cerebellar: normal finger-to-nose,  normal heel-to-shin test Gait:  No tested due to safety reasons CV: pulses palpable throughout    Lab Results  Component Value Date/Time   CHOL 186 01/28/2012 11:44 PM    Results for orders placed or performed during the hospital encounter of 10/12/14 (from the past 48 hour(s))  Comprehensive metabolic panel     Status: Abnormal   Collection Time: 10/12/14 12:30 PM  Result Value Ref Range   Sodium 141 137 - 147 mEq/L   Potassium 3.8 3.7 - 5.3 mEq/L   Chloride 100 96 - 112 mEq/L   CO2 27 19 - 32 mEq/L   Glucose, Bld 157 (H) 70 - 99 mg/dL   BUN 21 6 - 23 mg/dL   Creatinine, Ser 0.80 0.50 - 1.35 mg/dL   Calcium 9.4 8.4 - 10.5 mg/dL   Total Protein 7.5 6.0 - 8.3 g/dL   Albumin 4.1 3.5 - 5.2 g/dL   AST 37 0 - 37 U/L   ALT 41 0 - 53 U/L   Alkaline Phosphatase 48 39 - 117 U/L   Total Bilirubin 0.5 0.3 - 1.2 mg/dL   GFR calc non Af Amer 85 (L) >90 mL/min   GFR calc Af Amer >90 >90 mL/min    Comment: (NOTE) The eGFR has been calculated using the CKD EPI equation. This calculation has not  been validated in all clinical situations. eGFR's persistently <90 mL/min signify possible Chronic  Kidney Disease.    Anion gap 14 5 - 15  CBC with Differential     Status: None   Collection Time: 10/12/14 12:30 PM  Result Value Ref Range   WBC 7.5 4.0 - 10.5 K/uL   RBC 4.26 4.22 - 5.81 MIL/uL   Hemoglobin 13.7 13.0 - 17.0 g/dL   HCT 40.2 39.0 - 52.0 %   MCV 94.4 78.0 - 100.0 fL   MCH 32.2 26.0 - 34.0 pg   MCHC 34.1 30.0 - 36.0 g/dL   RDW 12.8 11.5 - 15.5 %   Platelets 207 150 - 400 K/uL   Neutrophils Relative % 76 43 - 77 %   Neutro Abs 5.7 1.7 - 7.7 K/uL   Lymphocytes Relative 18 12 - 46 %   Lymphs Abs 1.4 0.7 - 4.0 K/uL   Monocytes Relative 5 3 - 12 %   Monocytes Absolute 0.4 0.1 - 1.0 K/uL   Eosinophils Relative 1 0 - 5 %   Eosinophils Absolute 0.1 0.0 - 0.7 K/uL   Basophils Relative 0 0 - 1 %   Basophils Absolute 0.0 0.0 - 0.1 K/uL  Troponin I (q 6hr x 3)     Status: None   Collection Time: 10/12/14  8:28 PM  Result Value Ref Range   Troponin I <0.30 <0.30 ng/mL    Comment:        Due to the release kinetics of cTnI, a negative result within the first hours of the onset of symptoms does not rule out myocardial infarction with certainty. If myocardial infarction is still suspected, repeat the test at appropriate intervals.     Ct Head Wo Contrast  10/12/2014   CLINICAL DATA:  Severe dizziness starting this morning, history of TIA 3 years ago  EXAM: CT HEAD WITHOUT CONTRAST  TECHNIQUE: Contiguous axial images were obtained from the base of the skull through the vertex without intravenous contrast.  COMPARISON:  01/29/2012 and 01/28/2012  FINDINGS: No skull fracture is noted. Paranasal sinuses and mastoid air cells are unremarkable.  No intracranial hemorrhage, mass effect or midline shift. Stable mild cerebral atrophy. Stable mild periventricular and patchy subcortical chronic white matter disease. No acute cortical infarction. No mass lesion is noted on this unenhanced scan.  IMPRESSION: No acute intracranial abnormality. Stable atrophy and chronic mild white matter  disease. No definite acute cortical infarction.   Electronically Signed   By: Liviu  Pop M.D.   On: 10/12/2014 10:37   Assessment/Plan: 78 y/o with possible TIA posterior circulation.  TIA work up underway. Continue aspirin pending results TIA evaluation. Stroke team will follow up tomorrow.   , MD 10/12/2014, 9:49 PM  [facility] 

## 2014-10-12 NOTE — ED Notes (Signed)
Pt reports dizzy since 0715- started while having conversation on phone- unable to walk without assist due to Sx

## 2014-10-12 NOTE — Progress Notes (Signed)
BP 122/75 mmHg  Pulse 52  Temp(Src) 97.6 F (36.4 C) (Oral)  Resp 13  Ht 6' (1.829 m)  Wt 102.059 kg (225 lb)  BMI 30.51 kg/m2  SpO2 96%   78 y/o with PMH of HTN, TIA came in with lightheadedness and weakness in the right with involuntary movement. Went to [facility] [place]. Symptoms had resolved upon arrival. Head Ct negative. Admission for obsevation and work up for TIA.

## 2014-10-12 NOTE — Progress Notes (Signed)
Patient recd to 4N22 via stretcher. Patient ambulated with steady gait to hospital bed. Patient and family oriented to surroundings and safety plan. MD notified per [facility] text that patient has arrived. Telemetry in place.

## 2014-10-13 ENCOUNTER — Observation Stay (HOSPITAL_COMMUNITY): Payer: Medicare Other

## 2014-10-13 DIAGNOSIS — G459 Transient cerebral ischemic attack, unspecified: Secondary | ICD-10-CM

## 2014-10-13 DIAGNOSIS — G458 Other transient cerebral ischemic attacks and related syndromes: Secondary | ICD-10-CM

## 2014-10-13 DIAGNOSIS — R251 Tremor, unspecified: Secondary | ICD-10-CM | POA: Diagnosis not present

## 2014-10-13 DIAGNOSIS — I6529 Occlusion and stenosis of unspecified carotid artery: Secondary | ICD-10-CM | POA: Diagnosis not present

## 2014-10-13 DIAGNOSIS — I10 Essential (primary) hypertension: Secondary | ICD-10-CM | POA: Diagnosis not present

## 2014-10-13 DIAGNOSIS — I379 Nonrheumatic pulmonary valve disorder, unspecified: Secondary | ICD-10-CM

## 2014-10-13 LAB — GLUCOSE, CAPILLARY
GLUCOSE-CAPILLARY: 90 mg/dL (ref 70–99)
Glucose-Capillary: 94 mg/dL (ref 70–99)

## 2014-10-13 LAB — HEMOGLOBIN A1C
Hgb A1c MFr Bld: 5.8 % — ABNORMAL HIGH (ref <5.7)
MEAN PLASMA GLUCOSE: 120 mg/dL — AB (ref <117)

## 2014-10-13 LAB — LIPID PANEL
Cholesterol: 157 mg/dL (ref 0–200)
HDL: 34 mg/dL — ABNORMAL LOW (ref >39)
LDL CALC: 83 mg/dL (ref 0–99)
Total CHOL/HDL Ratio: 4.6 RATIO
Triglycerides: 201 mg/dL — ABNORMAL HIGH (ref <150)
VLDL: 40 mg/dL (ref 0–40)

## 2014-10-13 LAB — CK: CK TOTAL: 344 U/L — AB (ref 7–232)

## 2014-10-13 LAB — TROPONIN I: Troponin I: <0.3 ng/mL (ref <0.30)

## 2014-10-13 MED ORDER — CLOPIDOGREL BISULFATE 75 MG PO TABS
75.0000 mg | ORAL_TABLET | Freq: Every day | ORAL | Status: DC
  Administered 2014-10-13: 75 mg via ORAL
  Filled 2014-10-13: qty 1

## 2014-10-13 MED ORDER — CLOPIDOGREL BISULFATE 75 MG PO TABS: 75.0000 mg | ORAL_TABLET | Freq: Every day | ORAL | Status: DC

## 2014-10-13 NOTE — Progress Notes (Signed)
VASCULAR LAB PRELIMINARY  PRELIMINARY  PRELIMINARY  PRELIMINARY  Carotid duplex  completed.    Preliminary report:  Bilateral:  1-39% ICA stenosis.  Vertebral artery flow is antegrade.      Burnell Matlin, RVT 10/13/2014, 12:12 PM

## 2014-10-13 NOTE — Discharge Summary (Addendum)
[facility]  Physician Discharge Summary   Patient ID: Jose [name]. MRN: [id number] DOB/AGE: July 21, 1936 78 y.o.  Admit date: 10/12/2014 Discharge date: 10/13/2014  PCP: Karlene Einstein, MD  DISCHARGE DIAGNOSES:  Active Problems:   TIA (transient ischemic attack)   RECOMMENDATIONS FOR OUTPATIENT FOLLOW UP: 1. Now on Plavix instead of Aspirin. 2. Check TSH as outpatient due to bradycardia 3. Labetalol has been held for now.  DISCHARGE CONDITION: fair  Diet recommendation: Heart Healthy  Filed Weights   10/12/14 1040 10/12/14 2204  Weight: 102.059 kg (225 lb) 104.101 kg (229 lb 8 oz)    INITIAL HISTORY: 78 year old male presented with dizziness/lightheadedness and right arm tremors. By the time he arrived to the emergency department, his symptoms had resolved. They lasted about 3-4 hours. He was brought into the hospital for workup of TIA.  Consultants: Neurology  Procedures:  Carotid Doppler. 1-39% ICA stenosis. Vertebral artery flow is antegrade.   2-D echocardiogram. Study Conclusions - Left ventricle: The cavity size was normal. Systolic function wasnormal. The estimated ejection fraction was in the range of 50%to 55%. Wall motion was normal; there were no regional wallmotion abnormalities. Left ventricular diastolic functionparameters were normal.   HOSPITAL COURSE:   TIA  No stroke was noted on MRI. Neurology was consulted. This is most likely a TIA. Patient was taking aspirin prior to admission. It was changed over to Plavix. He is here to be seen by PT and OT. Carotid Dopplers and 2-D echocardiogram as above. There were reports of bradycardia and some irregular heart rhythm on the monitor. I reviewed all of these. There is no conclusive evidence for atrial fibrillation. EKG was repeated this morning which shows sinus bradycardia with marked sinus arrhythmia, which is probably causing an appearance of irregularity on the telemetry. LDL is  83.  History of elevated CK. He tells me that his CK levels are chronically elevated. Whenever he does strenuous exercise he gets muscle aches. And CK at those times are significantly high. Baseline CK level was obtained and is 344. Apparently this has been worked up extensively for this in the past. We did not initiate any further workup in the hospital. However, will not initiate statin therapy due to this history.  Essential HTN May resume home medications.  Sinus bradycardia. He was noted to have low heart rate, occasionally dropping into the 30s. Patient remained asymptomatic. He is noted to be on labetalol at home, which could be contributing to his sinus bradycardia. His heart rate did drop into the 30s overnight. We will hold this medication. TSH was ordered for tomorrow morning, but the patient leaves today this should be checked as an outpatient.  History of neuropathy May resume gabapentin.  History of seizure Not on medications  Patient is stable. His symptoms are resolved. He can be discharged later today once seen by PT and OT.  PERTINENT LABS:  The results of significant diagnostics from this hospitalization (including imaging, microbiology, ancillary and laboratory) are listed below for reference.     Labs: Basic Metabolic Panel:  Recent Labs Lab 10/12/14 1230  NA 141  K 3.8  CL 100  CO2 27  GLUCOSE 157*  BUN 21  CREATININE 0.80  CALCIUM 9.4   Liver Function Tests:  Recent Labs Lab 10/12/14 1230  AST 37  ALT 41  ALKPHOS 48  BILITOT 0.5  PROT 7.5  ALBUMIN 4.1   CBC:  Recent Labs Lab 10/12/14 1230  WBC 7.5  NEUTROABS 5.7  HGB  13.7  HCT 40.2  MCV 94.4  PLT 207   Cardiac Enzymes:  Recent Labs Lab 10/12/14 2028 10/13/14 10/13/14 0708  CKTOTAL  --   --  344*  TROPONINI <0.30 <0.30 <0.30   CBG:  Recent Labs Lab 10/12/14 2207 10/13/14 0655 10/13/14 1220  GLUCAP 120* 94 90     IMAGING STUDIES Dg Chest 2 View  10/13/2014    CLINICAL DATA:  Pt had a stroke yesterday, he was very weak, and had no balance. Hx of TIA a few years ago, Cancer, HTN, seizures  EXAM: CHEST  2 VIEW  COMPARISON:  12/19/2010  FINDINGS: Cardiac silhouette is normal in size and configuration. Normal mediastinal and hilar contours. Lungs are clear. No pleural effusion or pneumothorax.  Bony thorax is demineralized but intact.  IMPRESSION: No active cardiopulmonary disease.   Electronically Signed   By: Lajean Manes M.D.   On: 10/13/2014 11:41   Ct Head Wo Contrast  10/12/2014   CLINICAL DATA:  Severe dizziness starting this morning, history of TIA 3 years ago  EXAM: CT HEAD WITHOUT CONTRAST  TECHNIQUE: Contiguous axial images were obtained from the base of the skull through the vertex without intravenous contrast.  COMPARISON:  01/29/2012 and 01/28/2012  FINDINGS: No skull fracture is noted. Paranasal sinuses and mastoid air cells are unremarkable.  No intracranial hemorrhage, mass effect or midline shift. Stable mild cerebral atrophy. Stable mild periventricular and patchy subcortical chronic white matter disease. No acute cortical infarction. No mass lesion is noted on this unenhanced scan.  IMPRESSION: No acute intracranial abnormality. Stable atrophy and chronic mild white matter disease. No definite acute cortical infarction.   Electronically Signed   By: Lahoma Crocker M.D.   On: 10/12/2014 10:37   Mri Brain Without Contrast  10/13/2014   CLINICAL DATA:  Additional evaluation for dizziness, lightheadedness, right arm tremors.  EXAM: MRI HEAD WITHOUT CONTRAST  MRA HEAD WITHOUT CONTRAST  TECHNIQUE: Multiplanar, multiecho pulse sequences of the brain and surrounding structures were obtained without intravenous contrast. Angiographic images of the head were obtained using MRA technique without contrast.  COMPARISON:  Prior CT from 10/12/2014.  FINDINGS: MRI HEAD FINDINGS  Mild diffuse prominence of the CSF containing spaces is compatible with generalized  cerebral atrophy. Minimal T2/FLAIR hyperintensity seen within the periventricular and deep white matter of both cerebral hemispheres, nonspecific, but likely related to very minimal chronic small vessel ischemic changes.  No abnormal foci of restricted diffusion to suggest acute intracranial infarct identified. There is no intracranial hemorrhage. Gray-white matter differentiation maintained.  No mass lesion or midline shift. Ventricles are normal in size without evidence of hydrocephalus. No extra-axial fluid collection.  Craniocervical junction is normal. Incidental note made of a partially empty sella. No acute abnormality seen about the orbits.  Visualized bone marrow signal intensity is normal. No scalp soft tissue abnormality.  Mild mucoperiosteal thickening present within the ethmoidal air cells. Paranasal sinuses are otherwise clear. No mastoid effusion.  MRA HEAD FINDINGS  ANTERIOR CIRCULATION:  Visualized portions of the distal cervical segments of the internal carotid arteries are widely patent with antegrade flow. The petrous, cavernous, and supra clinoid segments are widely patent. A1 segments, anterior communicating artery, and anterior cerebral arteries well opacified bilaterally without significant stenosis or occlusion.  M1 segments well opacified bilaterally without hemodynamically significant stenosis or proximal branch occlusion. Minimal irregularity noted within the distal MCA branches bilaterally.  POSTERIOR CIRCULATION:  Right vertebral artery is slightly dominant. Left posterior inferior cerebral artery is  patent. The right posterior inferior cerebellar artery not visualized. Right anterior inferior cerebellar artery are well opacified. The left anterior inferior cerebral artery not visualized. Basilar artery bone well opacified. There is minimal ectasia of the basilar tip without aneurysm. Mild irregularity seen within the superior cerebellar arteries without significant stenosis or  occlusion.  Fetal origin of the right PCA noted. Right posterior cerebral artery overall opacified. There is mild to moderate multi focal atherosclerotic irregularity within the distal left PCA eye.  No aneurysm or vascular malformation.  IMPRESSION: MRI HEAD IMPRESSION:  1. No acute intracranial infarct or other abnormality identified. 2. Mild age-related cerebral atrophy with chronic small vessel ischemic changes.  MRA HEAD IMPRESSION:  1. No proximal branch occlusion or significant stenosis identified within the intracranial circulation. 2. Mild to moderate atherosclerotic irregularity within the mid -distal left posterior cerebral artery. 3. Fetal origin of the right PCA.   Electronically Signed   By: Jeannine Boga M.D.   On: 10/13/2014 06:39   Mr Jodene Nam Head/brain Wo Cm  10/13/2014   CLINICAL DATA:  Additional evaluation for dizziness, lightheadedness, right arm tremors.  EXAM: MRI HEAD WITHOUT CONTRAST  MRA HEAD WITHOUT CONTRAST  TECHNIQUE: Multiplanar, multiecho pulse sequences of the brain and surrounding structures were obtained without intravenous contrast. Angiographic images of the head were obtained using MRA technique without contrast.  COMPARISON:  Prior CT from 10/12/2014.  FINDINGS: MRI HEAD FINDINGS  Mild diffuse prominence of the CSF containing spaces is compatible with generalized cerebral atrophy. Minimal T2/FLAIR hyperintensity seen within the periventricular and deep white matter of both cerebral hemispheres, nonspecific, but likely related to very minimal chronic small vessel ischemic changes.  No abnormal foci of restricted diffusion to suggest acute intracranial infarct identified. There is no intracranial hemorrhage. Gray-white matter differentiation maintained.  No mass lesion or midline shift. Ventricles are normal in size without evidence of hydrocephalus. No extra-axial fluid collection.  Craniocervical junction is normal. Incidental note made of a partially empty sella. No  acute abnormality seen about the orbits.  Visualized bone marrow signal intensity is normal. No scalp soft tissue abnormality.  Mild mucoperiosteal thickening present within the ethmoidal air cells. Paranasal sinuses are otherwise clear. No mastoid effusion.  MRA HEAD FINDINGS  ANTERIOR CIRCULATION:  Visualized portions of the distal cervical segments of the internal carotid arteries are widely patent with antegrade flow. The petrous, cavernous, and supra clinoid segments are widely patent. A1 segments, anterior communicating artery, and anterior cerebral arteries well opacified bilaterally without significant stenosis or occlusion.  M1 segments well opacified bilaterally without hemodynamically significant stenosis or proximal branch occlusion. Minimal irregularity noted within the distal MCA branches bilaterally.  POSTERIOR CIRCULATION:  Right vertebral artery is slightly dominant. Left posterior inferior cerebral artery is patent. The right posterior inferior cerebellar artery not visualized. Right anterior inferior cerebellar artery are well opacified. The left anterior inferior cerebral artery not visualized. Basilar artery bone well opacified. There is minimal ectasia of the basilar tip without aneurysm. Mild irregularity seen within the superior cerebellar arteries without significant stenosis or occlusion.  Fetal origin of the right PCA noted. Right posterior cerebral artery overall opacified. There is mild to moderate multi focal atherosclerotic irregularity within the distal left PCA eye.  No aneurysm or vascular malformation.  IMPRESSION: MRI HEAD IMPRESSION:  1. No acute intracranial infarct or other abnormality identified. 2. Mild age-related cerebral atrophy with chronic small vessel ischemic changes.  MRA HEAD IMPRESSION:  1. No proximal branch occlusion or significant stenosis identified  within the intracranial circulation. 2. Mild to moderate atherosclerotic irregularity within the mid -distal left  posterior cerebral artery. 3. Fetal origin of the right PCA.   Electronically Signed   By: Jeannine Boga M.D.   On: 10/13/2014 06:39    DISCHARGE EXAMINATION: Filed Vitals:   10/13/14 0201 10/13/14 0555 10/13/14 1025 10/13/14 1447  BP: 134/75 136/70 147/80 142/78  Pulse: 77 47 54 67  Temp: 97.8 F (36.6 C) 97.6 F (36.4 C) 97.8 F (36.6 C) 98.3 F (36.8 C)  TempSrc: Oral Oral Oral Oral  Resp: 18 18 16 17   Height:      Weight:      SpO2: 94% 96% 99% 97%   General appearance: alert, cooperative, appears stated age and no distress Head: Normocephalic, without obvious abnormality, atraumatic Resp: clear to auscultation bilaterally Cardio: regular rate and rhythm, S1, S2 normal, no murmur, click, rub or gallop GI: soft, non-tender; bowel sounds normal; no masses,  no organomegaly  DISPOSITION: Home  Discharge Instructions    Ambulatory referral to Neurology    Complete by:  As directed   For follow up in 2 months (TIA)     Diet - low sodium heart healthy    Complete by:  As directed      Discharge instructions    Complete by:  As directed   Due to low Heart rate please do not take your Labetalol till you follow up with your doctor. Have your TSH (thyroid stimulating hormone level) checked as outpatient.     Increase activity slowly    Complete by:  As directed            ALLERGIES:  Allergies  Allergen Reactions  . Tetracycline Hcl     REACTION: rash: fixed drug reaction    Current Discharge Medication List    START taking these medications   Details  clopidogrel (PLAVIX) 75 MG tablet Take 1 tablet (75 mg total) by mouth daily. Qty: 30 tablet, Refills: 3      CONTINUE these medications which have NOT CHANGED   Details  acetaminophen (PAIN RELIEF) 650 MG CR tablet Take 1,300 mg by mouth at bedtime.     amLODipine (NORVASC) 10 MG tablet Take 1 tablet (10 mg total) by mouth daily. Qty: 90 tablet, Refills: 3    GABAPENTIN PO Take by mouth 2 (two) times  daily.    LOSARTAN POTASSIUM PO Take 1 tablet by mouth 2 (two) times daily.     !! Multiple Vitamins-Minerals (CENTRUM SILVER PO) Take 1 tablet by mouth daily.     !! Multiple Vitamins-Minerals (ICAPS) TABS Take 1 tablet by mouth 2 (two) times daily.     Omega-3 Fatty Acids (FISH OIL CONCENTRATE) 1000 MG CAPS Take 2 capsules by mouth daily.     Red Yeast Rice Extract (RED YEAST RICE PO) Take 2 tablets by mouth daily.    tadalafil (CIALIS) 20 MG tablet Take 1 tablet (20 mg total) by mouth daily as needed. Qty: 10 tablet, Refills: 5    Testosterone 4 MG/24HR PT24 Place 1 patch onto the skin daily. Qty: 90 patch, Refills: 3    co-enzyme Q-10 30 MG capsule Take 100 mg by mouth 2 (two) times daily.      dextromethorphan (DELSYM) 30 MG/5ML liquid Take 60 mg by mouth as needed. Patient used this medication for his cough and cold.    dextromethorphan-guaiFENesin (MUCINEX DM) 30-600 MG per 12 hr tablet Take 1 tablet by mouth every 12 (twelve)  hours.    Glucosamine HCl-MSM (GLUCOSAMINE-MSM) 1500-500 MG/30ML LIQD Take 2 tablets by mouth daily.      !! - Potential duplicate medications found. Please discuss with provider.    STOP taking these medications     aspirin ([employer] ASPIRIN EC) 81 MG EC tablet      LABETALOL HCL PO        Follow-up Information    Follow up with Karlene Einstein, MD. Schedule an appointment as soon as possible for a visit in 1 week.   Specialty:  Family Medicine   Why:  for heart rate check, whether or not to resume Labetalol and post hospitalization follow up   Contact information:   [address] [place] [address] [phone]       Follow up with Lenor Coffin, MD. Schedule an appointment as soon as possible for a visit in 2 months.   Specialty:  Neurology   Contact information:   [address] [place] [address] [phone]       TOTAL DISCHARGE TIME: 35 mins.  [facility]  [facility] Pager  [phone]  10/13/2014, 5:00 PM

## 2014-10-13 NOTE — Progress Notes (Signed)
Patient discharged home with spouse. Script and discharged instructions given to patient and spouse. TIA handout and educational information given to patient. Patient and spouse stated understanding of instructions given. RN informed patient the importance of follow up appointment with PCP and neurology. Tomma Rakers RN

## 2014-10-13 NOTE — Evaluation (Signed)
Physical Therapy Evaluation Patient Details Name: Jose [name]. MRN: [id number] DOB: 12-27-1935 Today's Date: 10/13/2014   History of Present Illness  78 yo male presents with dizziness/ lightheadness and R arm tremors. Pt symptoms have resolved. CT (-) MRI (-) mild- moderate atherosclerotic within distal L PCA TIA workup underway    Clinical Impression  Patient presents close to functional baseline and able to tolerate higher level balance challenges without difficulty and negotiate steps safely. Pt reports all deficits have resolved. Pt does not require skilled therapy services. Discharge from therapy.    Follow Up Recommendations No PT follow up;Supervision - Intermittent    Equipment Recommendations  None recommended by PT    Recommendations for Other Services       Precautions / Restrictions Precautions Precautions: None Restrictions Weight Bearing Restrictions: No      Mobility  Bed Mobility Overal bed mobility: Modified Independent                Transfers Overall transfer level: Independent Equipment used: None Transfers: Sit to/from Stand Sit to Stand: Independent            Ambulation/Gait Ambulation/Gait assistance: Independent Ambulation Distance (Feet): 300 Feet Assistive device: None Gait Pattern/deviations: Step-through pattern;Drifts right/left   Gait velocity interpretation: at or above normal speed for age/gender General Gait Details: Pt tolerated higher level balance challenges with mild drifting with head turn to left on 1 occasion otherwise no deficits noted.  Stairs Stairs: Yes Stairs assistance: Modified independent (Device/Increase time) Stair Management: One rail Left;Alternating pattern Number of Stairs: 12    Wheelchair Mobility    Modified Rankin (Stroke Patients Only)       Balance Overall balance assessment: Independent   Sitting balance-Leahy Scale: Normal       Standing balance-Leahy Scale: Good                                Pertinent Vitals/Pain Pain Assessment: No/denies pain    Home Living Family/patient expects to be discharged to:: Private residence Living Arrangements: Spouse/significant other Available Help at Discharge: Family;Available 24 hours/day Type of Home: House Home Access: Stairs to enter Entrance Stairs-Rails: Right Entrance Stairs-Number of Steps: 3 Home Layout: Two level Home Equipment: None      Prior Function Level of Independence: Independent               Hand Dominance        Extremity/Trunk Assessment   Upper Extremity Assessment: Overall WFL for tasks assessed           Lower Extremity Assessment: Overall WFL for tasks assessed         Communication   Communication: No difficulties  Cognition Arousal/Alertness: Awake/alert Behavior During Therapy: WFL for tasks assessed/performed Overall Cognitive Status: Within Functional Limits for tasks assessed                      General Comments      Exercises        Assessment/Plan    PT Assessment Patent does not need any further PT services  PT Diagnosis     PT Problem List    PT Treatment Interventions     PT Goals (Current goals can be found in the Care Plan section) Acute Rehab PT Goals Patient Stated Goal: to return home ASAP PT Goal Formulation: All assessment and education complete, DC therapy Time For Goal Achievement:  10/27/14    Frequency     Barriers to discharge        Co-evaluation               End of Session Equipment Utilized During Treatment: Gait belt Activity Tolerance: Patient tolerated treatment well Patient left: in bed;with call bell/phone within reach Nurse Communication: Mobility status    Functional Assessment Tool Used: Clinical judgment Functional Limitation: Mobility: Walking and moving around Mobility: Walking and Moving Around Current Status ([id number]): At least 1 percent but less than 20 percent  impaired, limited or restricted Mobility: Walking and Moving Around Goal Status [phone]): 0 percent impaired, limited or restricted Mobility: Walking and Moving Around Discharge Status [phone]): 0 percent impaired, limited or restricted    Time: 3735-7897 PT Time Calculation (min) (ACUTE ONLY): 12 min   Charges:   PT Evaluation $Initial PT Evaluation Tier I: 1 Procedure PT Treatments $Gait Training: 8-22 mins   PT G Codes:   Functional Assessment Tool Used: Clinical judgment Functional Limitation: Mobility: Walking and moving around    [place], [place] A 10/13/2014, 5:01 PM Candy Sledge, PT, DPT [phone]

## 2014-10-13 NOTE — Progress Notes (Signed)
UR completed 

## 2014-10-13 NOTE — Discharge Instructions (Signed)
Transient Ischemic Attack  A transient ischemic attack (TIA) is a "warning stroke" that causes stroke-like symptoms. Unlike a stroke, a TIA does not cause permanent damage to the brain. The symptoms of a TIA can happen very fast and do not last long. It is important to know the symptoms of a TIA and what to do. This can help prevent a major stroke or death.  CAUSES   · A TIA is caused by a temporary blockage in an artery in the brain or neck (carotid artery). The blockage does not allow the brain to get the blood supply it needs and can cause different symptoms. The blockage can be caused by either:  ¨ A blood clot.  ¨ Fatty buildup (plaque) in a neck or brain artery.  RISK FACTORS  · High blood pressure (hypertension).  · High cholesterol.  · Diabetes mellitus.  · Heart disease.  · The build up of plaque in the blood vessels (peripheral artery disease or atherosclerosis).  · The build up of plaque in the blood vessels providing blood and oxygen to the brain (carotid artery stenosis).  · An abnormal heart rhythm (atrial fibrillation).  · Obesity.  · Smoking.  · Taking oral contraceptives (especially in combination with smoking).  · Physical inactivity.  · A diet high in fats, salt (sodium), and calories.  · Alcohol use.  · Use of illegal drugs (especially cocaine and methamphetamine).  · Being male.  · Being African American.  · Being over the age of 55.  · Family history of stroke.  · Previous history of blood clots, stroke, TIA, or heart attack.  · Sickle cell disease.  SYMPTOMS   TIA symptoms are the same as a stroke but are temporary. These symptoms usually develop suddenly, or may be newly present upon awakening from sleep:  · Sudden weakness or numbness of the face, arm, or leg, especially on one side of the body.  · Sudden trouble walking or difficulty moving arms or legs.  · Sudden confusion.  · Sudden personality changes.  · Trouble speaking (aphasia) or understanding.  · Difficulty swallowing.  · Sudden  trouble seeing in one or both eyes.  · Double vision.  · Dizziness.  · Loss of balance or coordination.  · Sudden severe headache with no known cause.  · Trouble reading or writing.  · Loss of bowel or bladder control.  · Loss of consciousness.  DIAGNOSIS   Your caregiver may be able to determine the presence or absence of a TIA based on your symptoms, history, and physical exam. Computed tomography (CT scan) of the brain is usually performed to help identify a TIA. Other tests may be done to diagnose a TIA. These tests may include:  · Electrocardiography.  · Continuous heart monitoring.  · Echocardiography.  · Carotid ultrasonography.  · Magnetic resonance imaging (MRI).  · A scan of the brain circulation.  · Blood tests.  PREVENTION   The risk of a TIA can be decreased by appropriately treating high blood pressure, high cholesterol, diabetes, heart disease, and obesity and by quitting smoking, limiting alcohol, and staying physically active.  TREATMENT   Time is of the essence. Since the symptoms of TIA are the same as a stroke, it is important to seek treatment as soon as possible because you may need a medicine to dissolve the clot (thrombolytic) that cannot be given if too much time has passed. Treatment options vary. Treatment options may include rest, oxygen, intravenous (IV) fluids,   and medicines to thin the blood (anticoagulants). Medicines and diet may be used to address diabetes, high blood pressure, and other risk factors. Measures will be taken to prevent short-term and long-term complications, including infection from breathing foreign material into the lungs (aspiration pneumonia), blood clots in the legs, and falls. Treatment options include procedures to either remove plaque in the carotid arteries or dilate carotid arteries that have narrowed due to plaque. Those procedures are:  · Carotid endarterectomy.  · Carotid angioplasty and stenting.  HOME CARE INSTRUCTIONS   · Take all medicines prescribed  by your caregiver. Follow the directions carefully. Medicines may be used to control risk factors for a stroke. Be sure you understand all your medicine instructions.  · You may be told to take aspirin or the anticoagulant warfarin. Warfarin needs to be taken exactly as instructed.  ¨ Taking too much or too little warfarin is dangerous. Too much warfarin increases the risk of bleeding. Too little warfarin continues to allow the risk for blood clots. While taking warfarin, you will need to have regular blood tests to measure your blood clotting time. A PT blood test measures how long it takes for blood to clot. Your PT is used to calculate another value called an INR. Your PT and INR help your caregiver to adjust your dose of warfarin. The dose can change for many reasons. It is critically important that you take warfarin exactly as prescribed.  ¨ Many foods, especially foods high in vitamin K can interfere with warfarin and affect the PT and INR. Foods high in vitamin K include spinach, kale, broccoli, cabbage, collard and turnip greens, brussels sprouts, peas, cauliflower, seaweed, and parsley as well as beef and pork liver, green tea, and soybean oil. You should eat a consistent amount of foods high in vitamin K. Avoid major changes in your diet, or notify your caregiver before changing your diet. Arrange a visit with a dietitian to answer your questions.  ¨ Many medicines can interfere with warfarin and affect the PT and INR. You must tell your caregiver about any and all medicines you take, this includes all vitamins and supplements. Be especially cautious with aspirin and anti-inflammatory medicines. Do not take or discontinue any prescribed or over-the-counter medicine except on the advice of your caregiver or pharmacist.  ¨ Warfarin can have side effects, such as excessive bruising or bleeding. You will need to hold pressure over cuts for longer than usual. Your caregiver or pharmacist will discuss other  potential side effects.  ¨ Avoid sports or activities that may cause injury or bleeding.  ¨ Be mindful when shaving, flossing your teeth, or handling sharp objects.  ¨ Alcohol can change the body's ability to handle warfarin. It is best to avoid alcoholic drinks or consume only very small amounts while taking warfarin. Notify your caregiver if you change your alcohol intake.  ¨ Notify your dentist or other caregivers before procedures.  · Eat a diet that includes 5 or more servings of fruits and vegetables each day. This may reduce the risk of stroke. Certain diets may be prescribed to address high blood pressure, high cholesterol, diabetes, or obesity.  ¨ A low-sodium, low-saturated fat, low-trans fat, low-cholesterol diet is recommended to manage high blood pressure.  ¨ A low-saturated fat, low-trans fat, low-cholesterol, and high-fiber diet may control cholesterol levels.  ¨ A controlled-carbohydrate, controlled-sugar diet is recommended to manage diabetes.  ¨ A reduced-calorie, low-sodium, low-saturated fat, low-trans fat, low-cholesterol diet is recommended to manage obesity.  ·   Maintain a healthy weight.  · Stay physically active. It is recommended that you get at least 30 minutes of activity on most or all days.  · Do not smoke.  · Limit alcohol use even if you are not taking warfarin. Moderate alcohol use is considered to be:  ¨ No more than 2 drinks each day for men.  ¨ No more than 1 drink each day for nonpregnant women.  · Stop drug abuse.  · Home safety. A safe home environment is important to reduce the risk of falls. Your caregiver may arrange for specialists to evaluate your home. Having grab bars in the bedroom and bathroom is often important. Your caregiver may arrange for equipment to be used at home, such as raised toilets and a seat for the shower.  · Follow all instructions for follow-up with your caregiver. This is very important. This includes any referrals and lab tests. Proper follow up can  prevent a stroke or another TIA from occurring.  SEEK MEDICAL CARE IF:  · You have personality changes.  · You have difficulty swallowing.  · You are seeing double.  · You have dizziness.  · You have a fever.  · You have skin breakdown.  SEEK IMMEDIATE MEDICAL CARE IF:   Any of these symptoms may represent a serious problem that is an emergency. Do not wait to see if the symptoms will go away. Get medical help right away. Call your local emergency services (911 in [place] Do not drive yourself to the hospital.  · You have sudden weakness or numbness of the face, arm, or leg, especially on one side of the body.  · You have sudden trouble walking or difficulty moving arms or legs.  · You have sudden confusion.  · You have trouble speaking (aphasia) or understanding.  · You have sudden trouble seeing in one or both eyes.  · You have a loss of balance or coordination.  · You have a sudden, severe headache with no known cause.  · You have new chest pain or an irregular heartbeat.  · You have a partial or total loss of consciousness.  MAKE SURE YOU:   · Understand these instructions.  · Will watch your condition.  · Will get help right away if you are not doing well or get worse.  Document Released: 07/25/2005 Document Revised: 10/20/2013 Document Reviewed: 01/20/2014  [employer]® Patient Information ©2015 [employer]. This information is not intended to replace advice given to you by your health care provider. Make sure you discuss any questions you have with your health care provider.

## 2014-10-13 NOTE — Progress Notes (Signed)
Patient's heart rate continued to dip into brady range but was not sustained, I administered 2 litre's of oxygen through nasal canula and this appears to have helped. Patient snores heavily and has apneic breathing while sleeping. Will continue to monitor.

## 2014-10-13 NOTE — Progress Notes (Signed)
[facility] PROGRESS NOTE  Jose [name]. CVE:[id number] DOB: 01-01-1936 DOA: 10/12/2014  PCP: Karlene Einstein, MD  Brief HPI: 78 year old male presented with dizziness/lightheadedness and right arm tremors. By the time he arrived to the emergency department, his symptoms had resolved. They lasted about 3-4 hours. He was brought into the hospital for workup of TIA.  Past medical history:  Past Medical History  Diagnosis Date  . History of CT scan of head 1997    small vessel disease  . Gallstones 07/1996  . History of ETT 05/2002     no EKG changes  . History of MRI of cervical spine 07/15/2002  . Rectal bleeding 12/2002    colonoscopy  . Hypertension   . MGUS (monoclonal gammopathy of unknown significance) 04/11/2012  . Skin cancer   . Seizures     last seizure 1976  . TIA (transient ischemic attack)     Consultants: Neurology  Procedures:  Carotid Doppler. 1-39% ICA stenosis. Vertebral artery flow is antegrade.   2-D echocardiogram. Pending  Antibiotics: None  Subjective: Patient feels back to normal at this time. Denies any complaints. He is anxious about his symptoms and wants to know what he can do to prevent them in the future.  Objective: Vital Signs  Filed Vitals:   10/12/14 2204 10/13/14 0201 10/13/14 0555 10/13/14 1025  BP: 128/83 134/75 136/70 147/80  Pulse: 59 77 47 54  Temp: 98.5 F (36.9 C) 97.8 F (36.6 C) 97.6 F (36.4 C) 97.8 F (36.6 C)  TempSrc: Oral Oral Oral Oral  Resp:  18 18 16   Height: 5\' 11"  (1.803 m)     Weight: 104.101 kg (229 lb 8 oz)     SpO2: 95% 94% 96% 99%    Intake/Output Summary (Last 24 hours) at 10/13/14 1218 Last data filed at 10/12/14 1317  Gross per 24 hour  Intake      0 ml  Output    400 ml  Net   -400 ml   Filed Weights   10/12/14 1040 10/12/14 2204  Weight: 102.059 kg (225 lb) 104.101 kg (229 lb 8 oz)    General appearance: alert, cooperative, appears stated age and no distress Resp: clear  to auscultation bilaterally Cardio: regular rate and rhythm, S1, S2 normal, no murmur, click, rub or gallop GI: soft, non-tender; bowel sounds normal; no masses,  no organomegaly Extremities: extremities normal, atraumatic, no cyanosis or edema Neurologic: Alert and oriented 3. No facial asymmetry. Strength is normal bilateral upper and lower extremities.  Lab Results:  Basic Metabolic Panel:  Recent Labs Lab 10/12/14 1230  NA 141  K 3.8  CL 100  CO2 27  GLUCOSE 157*  BUN 21  CREATININE 0.80  CALCIUM 9.4   Liver Function Tests:  Recent Labs Lab 10/12/14 1230  AST 37  ALT 41  ALKPHOS 48  BILITOT 0.5  PROT 7.5  ALBUMIN 4.1   CBC:  Recent Labs Lab 10/12/14 1230  WBC 7.5  NEUTROABS 5.7  HGB 13.7  HCT 40.2  MCV 94.4  PLT 207   Cardiac Enzymes:  Recent Labs Lab 10/12/14 2028 10/13/14 10/13/14 0708  CKTOTAL  --   --  61*  TROPONINI <0.30 <0.30 <0.30   CBG:  Recent Labs Lab 10/12/14 2207 10/13/14 0655  GLUCAP 120* 94    Studies/Results: Dg Chest 2 View  10/13/2014   CLINICAL DATA:  Pt had a stroke yesterday, he was very weak, and had no balance. Hx of TIA  a few years ago, Cancer, HTN, seizures  EXAM: CHEST  2 VIEW  COMPARISON:  12/19/2010  FINDINGS: Cardiac silhouette is normal in size and configuration. Normal mediastinal and hilar contours. Lungs are clear. No pleural effusion or pneumothorax.  Bony thorax is demineralized but intact.  IMPRESSION: No active cardiopulmonary disease.   Electronically Signed   By: Lajean Manes M.D.   On: 10/13/2014 11:41   Ct Head Wo Contrast  10/12/2014   CLINICAL DATA:  Severe dizziness starting this morning, history of TIA 3 years ago  EXAM: CT HEAD WITHOUT CONTRAST  TECHNIQUE: Contiguous axial images were obtained from the base of the skull through the vertex without intravenous contrast.  COMPARISON:  01/29/2012 and 01/28/2012  FINDINGS: No skull fracture is noted. Paranasal sinuses and mastoid air cells are  unremarkable.  No intracranial hemorrhage, mass effect or midline shift. Stable mild cerebral atrophy. Stable mild periventricular and patchy subcortical chronic white matter disease. No acute cortical infarction. No mass lesion is noted on this unenhanced scan.  IMPRESSION: No acute intracranial abnormality. Stable atrophy and chronic mild white matter disease. No definite acute cortical infarction.   Electronically Signed   By: Lahoma Crocker M.D.   On: 10/12/2014 10:37   Mri Brain Without Contrast  10/13/2014   CLINICAL DATA:  Additional evaluation for dizziness, lightheadedness, right arm tremors.  EXAM: MRI HEAD WITHOUT CONTRAST  MRA HEAD WITHOUT CONTRAST  TECHNIQUE: Multiplanar, multiecho pulse sequences of the brain and surrounding structures were obtained without intravenous contrast. Angiographic images of the head were obtained using MRA technique without contrast.  COMPARISON:  Prior CT from 10/12/2014.  FINDINGS: MRI HEAD FINDINGS  Mild diffuse prominence of the CSF containing spaces is compatible with generalized cerebral atrophy. Minimal T2/FLAIR hyperintensity seen within the periventricular and deep white matter of both cerebral hemispheres, nonspecific, but likely related to very minimal chronic small vessel ischemic changes.  No abnormal foci of restricted diffusion to suggest acute intracranial infarct identified. There is no intracranial hemorrhage. Gray-white matter differentiation maintained.  No mass lesion or midline shift. Ventricles are normal in size without evidence of hydrocephalus. No extra-axial fluid collection.  Craniocervical junction is normal. Incidental note made of a partially empty sella. No acute abnormality seen about the orbits.  Visualized bone marrow signal intensity is normal. No scalp soft tissue abnormality.  Mild mucoperiosteal thickening present within the ethmoidal air cells. Paranasal sinuses are otherwise clear. No mastoid effusion.  MRA HEAD FINDINGS  ANTERIOR  CIRCULATION:  Visualized portions of the distal cervical segments of the internal carotid arteries are widely patent with antegrade flow. The petrous, cavernous, and supra clinoid segments are widely patent. A1 segments, anterior communicating artery, and anterior cerebral arteries well opacified bilaterally without significant stenosis or occlusion.  M1 segments well opacified bilaterally without hemodynamically significant stenosis or proximal branch occlusion. Minimal irregularity noted within the distal MCA branches bilaterally.  POSTERIOR CIRCULATION:  Right vertebral artery is slightly dominant. Left posterior inferior cerebral artery is patent. The right posterior inferior cerebellar artery not visualized. Right anterior inferior cerebellar artery are well opacified. The left anterior inferior cerebral artery not visualized. Basilar artery bone well opacified. There is minimal ectasia of the basilar tip without aneurysm. Mild irregularity seen within the superior cerebellar arteries without significant stenosis or occlusion.  Fetal origin of the right PCA noted. Right posterior cerebral artery overall opacified. There is mild to moderate multi focal atherosclerotic irregularity within the distal left PCA eye.  No aneurysm or vascular malformation.  IMPRESSION: MRI HEAD IMPRESSION:  1. No acute intracranial infarct or other abnormality identified. 2. Mild age-related cerebral atrophy with chronic small vessel ischemic changes.  MRA HEAD IMPRESSION:  1. No proximal branch occlusion or significant stenosis identified within the intracranial circulation. 2. Mild to moderate atherosclerotic irregularity within the mid -distal left posterior cerebral artery. 3. Fetal origin of the right PCA.   Electronically Signed   By: Jeannine Boga M.D.   On: 10/13/2014 06:39   Mr Jodene Nam Head/brain Wo Cm  10/13/2014   CLINICAL DATA:  Additional evaluation for dizziness, lightheadedness, right arm tremors.  EXAM: MRI HEAD  WITHOUT CONTRAST  MRA HEAD WITHOUT CONTRAST  TECHNIQUE: Multiplanar, multiecho pulse sequences of the brain and surrounding structures were obtained without intravenous contrast. Angiographic images of the head were obtained using MRA technique without contrast.  COMPARISON:  Prior CT from 10/12/2014.  FINDINGS: MRI HEAD FINDINGS  Mild diffuse prominence of the CSF containing spaces is compatible with generalized cerebral atrophy. Minimal T2/FLAIR hyperintensity seen within the periventricular and deep white matter of both cerebral hemispheres, nonspecific, but likely related to very minimal chronic small vessel ischemic changes.  No abnormal foci of restricted diffusion to suggest acute intracranial infarct identified. There is no intracranial hemorrhage. Gray-white matter differentiation maintained.  No mass lesion or midline shift. Ventricles are normal in size without evidence of hydrocephalus. No extra-axial fluid collection.  Craniocervical junction is normal. Incidental note made of a partially empty sella. No acute abnormality seen about the orbits.  Visualized bone marrow signal intensity is normal. No scalp soft tissue abnormality.  Mild mucoperiosteal thickening present within the ethmoidal air cells. Paranasal sinuses are otherwise clear. No mastoid effusion.  MRA HEAD FINDINGS  ANTERIOR CIRCULATION:  Visualized portions of the distal cervical segments of the internal carotid arteries are widely patent with antegrade flow. The petrous, cavernous, and supra clinoid segments are widely patent. A1 segments, anterior communicating artery, and anterior cerebral arteries well opacified bilaterally without significant stenosis or occlusion.  M1 segments well opacified bilaterally without hemodynamically significant stenosis or proximal branch occlusion. Minimal irregularity noted within the distal MCA branches bilaterally.  POSTERIOR CIRCULATION:  Right vertebral artery is slightly dominant. Left posterior  inferior cerebral artery is patent. The right posterior inferior cerebellar artery not visualized. Right anterior inferior cerebellar artery are well opacified. The left anterior inferior cerebral artery not visualized. Basilar artery bone well opacified. There is minimal ectasia of the basilar tip without aneurysm. Mild irregularity seen within the superior cerebellar arteries without significant stenosis or occlusion.  Fetal origin of the right PCA noted. Right posterior cerebral artery overall opacified. There is mild to moderate multi focal atherosclerotic irregularity within the distal left PCA eye.  No aneurysm or vascular malformation.  IMPRESSION: MRI HEAD IMPRESSION:  1. No acute intracranial infarct or other abnormality identified. 2. Mild age-related cerebral atrophy with chronic small vessel ischemic changes.  MRA HEAD IMPRESSION:  1. No proximal branch occlusion or significant stenosis identified within the intracranial circulation. 2. Mild to moderate atherosclerotic irregularity within the mid -distal left posterior cerebral artery. 3. Fetal origin of the right PCA.   Electronically Signed   By: Jeannine Boga M.D.   On: 10/13/2014 06:39    Medications:  Scheduled: . aspirin  325 mg Oral Daily  . dextromethorphan-guaiFENesin  1 tablet Oral Q12H  . enoxaparin (LOVENOX) injection  40 mg Subcutaneous Q24H  . omega-3 acid ethyl esters  1 g Oral Daily   Continuous: . sodium chloride  1,000 mL (10/12/14 2220)   JAS:[id number]  Assessment/Plan:  Active Problems:   TIA (transient ischemic attack)    Questionable TIA  Workup is in progress. No stroke was noted on MRI. Neurology is following. Continue with aspirin for now. If this was a TIA then we may need to change his antiplatelet agent. PT/OT consultation is pending. Carotid Doppler as above. Echocardiogram is pending. There were reports of bradycardia and some irregular heart rhythm on the monitor. I reviewed all of these.  There is no conclusive evidence for atrial fibrillation. EKG was repeated this morning which shows sinus bradycardia with marked sinus arrhythmia, which is probably causing an appearance of irregularity on the telemetry. LDL is 83.  History of elevated CK. He tells me that his CK levels are chronically elevated. Whenever he does strenuous exercise he gets muscle aches. And CK at those times are significantly high. Baseline CK level was obtained and is 344. Apparently this has been worked up extensively for this in the past. We will not initiate any further workup in the hospital. However, will hesitate initiating statin therapy due to this history.  Essential HTN Permissive HTN in case this is TIA.  Sinus bradycardia. Asymptomatic. Check TSH level in the morning. He is noted to be on labetalol at home, which could be contributing to his sinus bradycardia. His heart rate did drop into the 30s overnight. We will hold this medication.  History of neuropathy On gabapentin, unknown doses.   History of seizure Not on medications  DVT Prophylaxis: Lovenox    Code Status: Full code  Family Communication: Discussed with the patient  Disposition Plan: Await TIA workup to be completed.    LOS: 1 day   [facility] Pager [phone] 10/13/2014, 12:18 PM  If 8PM-8AM, please contact night-coverage at [email or web address], password [facility]

## 2014-10-13 NOTE — Progress Notes (Signed)
  Echocardiogram 2D Echocardiogram has been performed.  Jose [name] 10/13/2014, 1:30 PM

## 2014-10-13 NOTE — Progress Notes (Signed)
Patient became Jose [name] around 2330 also had A. Fibb. rhythm on occasions will notify attending and continue to monitor.

## 2014-10-13 NOTE — Progress Notes (Signed)
STROKE TEAM PROGRESS NOTE   HISTORY Jose [name]. is an 78 y.o. male with a past medical history significant for HTN, TIA, skin cancer,MGUS, peripheral neuropathy, seizures (last seizure 1976), admitted to [facility] for further evaluation of transient right arm "tiredness", dizziness, imbalance. He stated that this morning 10/12/2014 around 7:15 he was sitting in the bed with his wife, talking to his daughter on the phone when suddenly "something was wrong and I couldn't keep the phone on the right arm straight, the arm was struggling, moving around and feeling tired". He said that he got up from bed, went to the kitchen to eat something and the right arm was back to normal. However, while walking to the kitchen he felt " funny, dizzy, my balance was completely off". The whole episode lasted for about 3 hours and he said that he was not really confused or disoriented, did not have HA, vertigo, double vision, difficulty swallowing, slurred speech, language or vision impairment.  CT brain showed no acute intracranial abnormality.  Importantly, [name] mentions that this past summer he was driving to the beach when he sustained a transient episode of alteration of vision in the left visual field that lasted for 5 minutes.  He takes aspirin 81 mg daily.  Patient was not administered TPA secondary to resolved symptoms. He was admitted for further evaluation and treatment.   SUBJECTIVE (INTERVAL HISTORY) The patient is alert and cooperative, feels well currently. All deficits have resolved. The patient indicated that he had slight weakness of the right arm, and some gait instability lasting about 2 hours.   OBJECTIVE Temp:  [97.5 F (36.4 C)-98.5 F (36.9 C)] 97.8 F (36.6 C) (12/16 1025) Pulse Rate:  [47-80] 54 (12/16 1025) Cardiac Rhythm:  [-] Sinus bradycardia;Atrial fibrillation (12/16 0600) Resp:  [14-18] 16 (12/16 1025) BP: (117-147)/(70-87) 147/80 mmHg (12/16 1025) SpO2:  [94 %-99 %] 99  % (12/16 1025) Weight:  [103.874 kg (229 lb)-104.101 kg (229 lb 8 oz)] 103.874 kg (229 lb) (12/16 0537)   Recent Labs Lab 10/12/14 2207 10/13/14 0655 10/13/14 1220  GLUCAP 120* 94 90    Recent Labs Lab 10/12/14 1230  NA 141  K 3.8  CL 100  CO2 27  GLUCOSE 157*  BUN 21  CREATININE 0.80  CALCIUM 9.4    Recent Labs Lab 10/12/14 1230  AST 37  ALT 41  ALKPHOS 48  BILITOT 0.5  PROT 7.5  ALBUMIN 4.1    Recent Labs Lab 10/12/14 1230  WBC 7.5  NEUTROABS 5.7  HGB 13.7  HCT 40.2  MCV 94.4  PLT 207    Recent Labs Lab 10/12/14 2028 10/13/14 10/13/14 0708  CKTOTAL  --   --  75*  TROPONINI <0.30 <0.30 <0.30   No results for input(s): LABPROT, INR in the last 72 hours. No results for input(s): COLORURINE, LABSPEC, [place], GLUCOSEU, HGBUR, BILIRUBINUR, KETONESUR, PROTEINUR, UROBILINOGEN, NITRITE, LEUKOCYTESUR in the last 72 hours.  Invalid input(s): APPERANCEUR     Component Value Date/Time   CHOL 157 10/13/2014 0000   TRIG 201* 10/13/2014 0000   HDL 34* 10/13/2014 0000   CHOLHDL 4.6 10/13/2014 0000   VLDL 40 10/13/2014 0000   LDLCALC 83 10/13/2014 0000   Lab Results  Component Value Date   HGBA1C 5.8* 10/12/2014   No results found for: LABOPIA, COCAINSCRNUR, LABBENZ, AMPHETMU, THCU, LABBARB  No results for input(s): ETH in the last 168 hours.  Dg Chest 2 View  10/13/2014   CLINICAL DATA:  Pt had a stroke yesterday, he was very weak, and had no balance. Hx of TIA a few years ago, Cancer, HTN, seizures  EXAM: CHEST  2 VIEW  COMPARISON:  12/19/2010  FINDINGS: Cardiac silhouette is normal in size and configuration. Normal mediastinal and hilar contours. Lungs are clear. No pleural effusion or pneumothorax.  Bony thorax is demineralized but intact.  IMPRESSION: No active cardiopulmonary disease.   Electronically Signed   By: Lajean Manes M.D.   On: 10/13/2014 11:41   Ct Head Wo Contrast  10/12/2014   CLINICAL DATA:  Severe dizziness starting this morning,  history of TIA 3 years ago  EXAM: CT HEAD WITHOUT CONTRAST  TECHNIQUE: Contiguous axial images were obtained from the base of the skull through the vertex without intravenous contrast.  COMPARISON:  01/29/2012 and 01/28/2012  FINDINGS: No skull fracture is noted. Paranasal sinuses and mastoid air cells are unremarkable.  No intracranial hemorrhage, mass effect or midline shift. Stable mild cerebral atrophy. Stable mild periventricular and patchy subcortical chronic white matter disease. No acute cortical infarction. No mass lesion is noted on this unenhanced scan.  IMPRESSION: No acute intracranial abnormality. Stable atrophy and chronic mild white matter disease. No definite acute cortical infarction.   Electronically Signed   By: Lahoma Crocker M.D.   On: 10/12/2014 10:37   Mri Brain Without Contrast  10/13/2014   CLINICAL DATA:  Additional evaluation for dizziness, lightheadedness, right arm tremors.  EXAM: MRI HEAD WITHOUT CONTRAST  MRA HEAD WITHOUT CONTRAST  TECHNIQUE: Multiplanar, multiecho pulse sequences of the brain and surrounding structures were obtained without intravenous contrast. Angiographic images of the head were obtained using MRA technique without contrast.  COMPARISON:  Prior CT from 10/12/2014.  FINDINGS: MRI HEAD FINDINGS  Mild diffuse prominence of the CSF containing spaces is compatible with generalized cerebral atrophy. Minimal T2/FLAIR hyperintensity seen within the periventricular and deep white matter of both cerebral hemispheres, nonspecific, but likely related to very minimal chronic small vessel ischemic changes.  No abnormal foci of restricted diffusion to suggest acute intracranial infarct identified. There is no intracranial hemorrhage. Gray-white matter differentiation maintained.  No mass lesion or midline shift. Ventricles are normal in size without evidence of hydrocephalus. No extra-axial fluid collection.  Craniocervical junction is normal. Incidental note made of a partially  empty sella. No acute abnormality seen about the orbits.  Visualized bone marrow signal intensity is normal. No scalp soft tissue abnormality.  Mild mucoperiosteal thickening present within the ethmoidal air cells. Paranasal sinuses are otherwise clear. No mastoid effusion.  MRA HEAD FINDINGS  ANTERIOR CIRCULATION:  Visualized portions of the distal cervical segments of the internal carotid arteries are widely patent with antegrade flow. The petrous, cavernous, and supra clinoid segments are widely patent. A1 segments, anterior communicating artery, and anterior cerebral arteries well opacified bilaterally without significant stenosis or occlusion.  M1 segments well opacified bilaterally without hemodynamically significant stenosis or proximal branch occlusion. Minimal irregularity noted within the distal MCA branches bilaterally.  POSTERIOR CIRCULATION:  Right vertebral artery is slightly dominant. Left posterior inferior cerebral artery is patent. The right posterior inferior cerebellar artery not visualized. Right anterior inferior cerebellar artery are well opacified. The left anterior inferior cerebral artery not visualized. Basilar artery bone well opacified. There is minimal ectasia of the basilar tip without aneurysm. Mild irregularity seen within the superior cerebellar arteries without significant stenosis or occlusion.  Fetal origin of the right PCA noted. Right posterior cerebral artery overall opacified. There is mild to moderate  multi focal atherosclerotic irregularity within the distal left PCA eye.  No aneurysm or vascular malformation.  IMPRESSION: MRI HEAD IMPRESSION:  1. No acute intracranial infarct or other abnormality identified. 2. Mild age-related cerebral atrophy with chronic small vessel ischemic changes.  MRA HEAD IMPRESSION:  1. No proximal branch occlusion or significant stenosis identified within the intracranial circulation. 2. Mild to moderate atherosclerotic irregularity within the  mid -distal left posterior cerebral artery. 3. Fetal origin of the right PCA.   Electronically Signed   By: Jeannine Boga M.D.   On: 10/13/2014 06:39   Mr Jodene Nam Head/brain Wo Cm  10/13/2014   CLINICAL DATA:  Additional evaluation for dizziness, lightheadedness, right arm tremors.  EXAM: MRI HEAD WITHOUT CONTRAST  MRA HEAD WITHOUT CONTRAST  TECHNIQUE: Multiplanar, multiecho pulse sequences of the brain and surrounding structures were obtained without intravenous contrast. Angiographic images of the head were obtained using MRA technique without contrast.  COMPARISON:  Prior CT from 10/12/2014.  FINDINGS: MRI HEAD FINDINGS  Mild diffuse prominence of the CSF containing spaces is compatible with generalized cerebral atrophy. Minimal T2/FLAIR hyperintensity seen within the periventricular and deep white matter of both cerebral hemispheres, nonspecific, but likely related to very minimal chronic small vessel ischemic changes.  No abnormal foci of restricted diffusion to suggest acute intracranial infarct identified. There is no intracranial hemorrhage. Gray-white matter differentiation maintained.  No mass lesion or midline shift. Ventricles are normal in size without evidence of hydrocephalus. No extra-axial fluid collection.  Craniocervical junction is normal. Incidental note made of a partially empty sella. No acute abnormality seen about the orbits.  Visualized bone marrow signal intensity is normal. No scalp soft tissue abnormality.  Mild mucoperiosteal thickening present within the ethmoidal air cells. Paranasal sinuses are otherwise clear. No mastoid effusion.  MRA HEAD FINDINGS  ANTERIOR CIRCULATION:  Visualized portions of the distal cervical segments of the internal carotid arteries are widely patent with antegrade flow. The petrous, cavernous, and supra clinoid segments are widely patent. A1 segments, anterior communicating artery, and anterior cerebral arteries well opacified bilaterally without  significant stenosis or occlusion.  M1 segments well opacified bilaterally without hemodynamically significant stenosis or proximal branch occlusion. Minimal irregularity noted within the distal MCA branches bilaterally.  POSTERIOR CIRCULATION:  Right vertebral artery is slightly dominant. Left posterior inferior cerebral artery is patent. The right posterior inferior cerebellar artery not visualized. Right anterior inferior cerebellar artery are well opacified. The left anterior inferior cerebral artery not visualized. Basilar artery bone well opacified. There is minimal ectasia of the basilar tip without aneurysm. Mild irregularity seen within the superior cerebellar arteries without significant stenosis or occlusion.  Fetal origin of the right PCA noted. Right posterior cerebral artery overall opacified. There is mild to moderate multi focal atherosclerotic irregularity within the distal left PCA eye.  No aneurysm or vascular malformation.  IMPRESSION: MRI HEAD IMPRESSION:  1. No acute intracranial infarct or other abnormality identified. 2. Mild age-related cerebral atrophy with chronic small vessel ischemic changes.  MRA HEAD IMPRESSION:  1. No proximal branch occlusion or significant stenosis identified within the intracranial circulation. 2. Mild to moderate atherosclerotic irregularity within the mid -distal left posterior cerebral artery. 3. Fetal origin of the right PCA.   Electronically Signed   By: Jeannine Boga M.D.   On: 10/13/2014 06:39   2D Echocardiogram  EF 50-55% with no source of embolus.   Carotid Doppler  There is 1-39% bilateral ICA stenosis. Vertebral artery flow is antegrade.  Physical Exam  General: The patient is alert and cooperative at the time of the examination.  Skin: No significant peripheral edema is noted.   Neurologic Exam  Mental status: The patient is oriented x 3.  Cranial nerves: Facial symmetry is present. Speech is normal, no aphasia or dysarthria  is noted. Extraocular movements are full. Visual fields are full.  Motor: The patient has good strength in all 4 extremities.  Sensory examination: Soft touch sensation is symmetric on the face, arms, and legs.  Coordination: The patient has good finger-nose-finger and heel-to-shin bilaterally.  Gait and station: The gait was not tested.  Reflexes: Deep tendon reflexes are symmetric.      ASSESSMENT/PLAN Jose [name]. is a 78 y.o. male with history of HTN, TIA, skin cancer, MGUS, peripheral neuropathy, seizures (last seizure 1976)  presenting with transient right arm "tiredness", dizziness, imbalance. He did not receive IV t-PA due to resolution of symptoms.   TIA:   Transient right arm weakness and gait instability   No residual deficits  MRI  No acute stroke  MRA  No significant stenosis. L PCA atherosclerotic irregularity   Carotid Doppler  No significant stenosis   2D Echo  No source of embolus   HgbA1c 5.8  Lovenox 40 mg sq daily for VTE prophylaxis  Diet Heart thin liquids  aspirin 81 mg orally every day prior to admission, now on aspirin 325 mg orally every day  Patient counseled to be compliant with his antithrombotic medications  Ongoing aggressive stroke risk factor management  Therapy recommendations:  Pending, patient likely will not need therapy  Disposition:  Pending  Hypertension  Home meds:   Labetalol, losartan  Patient counseled to be compliant with his blood pressure medications  Hyperlipidemia  Home meds:  Red rice yeast and omega 3. Omega 3 resume in hospital  LDL 83, goal < 70  Other Stroke Risk Factors  Advanced age  Former Cigarette smoker, quit 46 years ago  ETOH use  Obesity, Body mass index is 32.02 kg/(m^2).   Hx stroke/TIA  Family hx TIA (mother)  Other Active Problems  Neuropathy, on neurontin  Other Pertinent History  Hx seizures, last seizure 1976, not on medications except for  neurontin  Hospital day # 1  Okay for discharge at this time, will switch patient to Plavix. Can follow-up with Dr. Jannifer Franklin in 2 months.   [place] for Pager information 10/13/2014 1:55 PM   Lenor Coffin [phone]  To contact Stroke Continuity provider, please refer to [email or web address] After hours, contact General Neurology

## 2014-10-14 LAB — GLUCOSE, CAPILLARY: Glucose-Capillary: 113 mg/dL — ABNORMAL HIGH (ref 70–99)

## 2014-10-29 DIAGNOSIS — J189 Pneumonia, unspecified organism: Secondary | ICD-10-CM

## 2014-10-29 HISTORY — DX: Pneumonia, unspecified organism: J18.9

## 2014-11-02 ENCOUNTER — Ambulatory Visit (INDEPENDENT_AMBULATORY_CARE_PROVIDER_SITE_OTHER): Payer: Medicare Other | Admitting: Neurology

## 2014-11-02 ENCOUNTER — Encounter: Payer: Self-pay | Admitting: Neurology

## 2014-11-02 ENCOUNTER — Telehealth: Payer: Self-pay | Admitting: Neurology

## 2014-11-02 VITALS — BP 135/84 | HR 81

## 2014-11-02 DIAGNOSIS — Z87898 Personal history of other specified conditions: Secondary | ICD-10-CM

## 2014-11-02 DIAGNOSIS — G458 Other transient cerebral ischemic attacks and related syndromes: Secondary | ICD-10-CM

## 2014-11-02 DIAGNOSIS — Z8669 Personal history of other diseases of the nervous system and sense organs: Secondary | ICD-10-CM

## 2014-11-02 NOTE — Procedures (Signed)
    History:  [name] is a 79 year old gentleman with a history of seizure-type events in the 1970s without any recurrence since that time. More recently, he has had TIA type events with loss of vision off to the left, an episode of speech problems, and an episode of heaviness of the right arm associated with a gait disturbance. The patient is being evaluated for these events.  This is a routine EEG. No skull defects are noted. Medications include Norvasc, Plavix, labetalol, losartan, multivitamins, fish oil, and red yeast rice.   EEG classification: Normal awake  Description of the recording: The background rhythms of this recording consists of a fairly well modulated medium amplitude alpha rhythm of 9 Hz that is reactive to eye opening and closure. As the record progresses, the patient appears to remain in the waking state throughout the recording. Photic stimulation was performed, resulting in a bilateral and symmetric photic driving response. Hyperventilation was not performed. At no time during the recording does there appear to be evidence of spike or spike wave discharges or evidence of focal slowing. EKG monitor shows no evidence of cardiac rhythm abnormalities with a heart rate of 60.  Impression: This is a normal EEG recording in the waking state. No evidence of ictal or interictal discharges are seen.

## 2014-11-02 NOTE — Patient Instructions (Signed)
Stroke Prevention Some medical conditions and behaviors are associated with an increased chance of having a stroke. You may prevent a stroke by making healthy choices and managing medical conditions. HOW CAN I REDUCE MY RISK OF HAVING A STROKE?   Stay physically active. Get at least 30 minutes of activity on most or all days.  Do not smoke. It may also be helpful to avoid exposure to secondhand smoke.  Limit alcohol use. Moderate alcohol use is considered to be:  No more than 2 drinks per day for men.  No more than 1 drink per day for nonpregnant women.  Eat healthy foods. This involves:  Eating 5 or more servings of fruits and vegetables a day.  Making dietary changes that address high blood pressure (hypertension), high cholesterol, diabetes, or obesity.  Manage your cholesterol levels.  Making food choices that are high in fiber and low in saturated fat, trans fat, and cholesterol may control cholesterol levels.  Take any prescribed medicines to control cholesterol as directed by your health care provider.  Manage your diabetes.  Controlling your carbohydrate and sugar intake is recommended to manage diabetes.  Take any prescribed medicines to control diabetes as directed by your health care provider.  Control your hypertension.  Making food choices that are low in salt (sodium), saturated fat, trans fat, and cholesterol is recommended to manage hypertension.  Take any prescribed medicines to control hypertension as directed by your health care provider.  Maintain a healthy weight.  Reducing calorie intake and making food choices that are low in sodium, saturated fat, trans fat, and cholesterol are recommended to manage weight.  Stop drug abuse.  Avoid taking birth control pills.  Talk to your health care provider about the risks of taking birth control pills if you are over 35 years old, smoke, get migraines, or have ever had a blood clot.  Get evaluated for sleep  disorders (sleep apnea).  Talk to your health care provider about getting a sleep evaluation if you snore a lot or have excessive sleepiness.  Take medicines only as directed by your health care provider.  For some people, aspirin or blood thinners (anticoagulants) are helpful in reducing the risk of forming abnormal blood clots that can lead to stroke. If you have the irregular heart rhythm of atrial fibrillation, you should be on a blood thinner unless there is a good reason you cannot take them.  Understand all your medicine instructions.  Make sure that other conditions (such as anemia or atherosclerosis) are addressed. SEEK IMMEDIATE MEDICAL CARE IF:   You have sudden weakness or numbness of the face, arm, or leg, especially on one side of the body.  Your face or eyelid droops to one side.  You have sudden confusion.  You have trouble speaking (aphasia) or understanding.  You have sudden trouble seeing in one or both eyes.  You have sudden trouble walking.  You have dizziness.  You have a loss of balance or coordination.  You have a sudden, severe headache with no known cause.  You have new chest pain or an irregular heartbeat. Any of these symptoms may represent a serious problem that is an emergency. Do not wait to see if the symptoms will go away. Get medical help at once. Call your local emergency services (911 in [place] Do not drive yourself to the hospital. Document Released: 11/22/2004 Document Revised: 03/01/2014 Document Reviewed: 04/17/2013 [employer] Patient Information 2015 [employer]. This information is not intended to replace advice given   to you by your health care provider. Make sure you discuss any questions you have with your health care provider.  

## 2014-11-02 NOTE — Telephone Encounter (Signed)
I called patient. The EEG study was unremarkable. Cardiac monitor is pending.
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
## 2014-11-03 ENCOUNTER — Other Ambulatory Visit: Payer: Self-pay | Admitting: Neurology

## 2014-11-03 DIAGNOSIS — G458 Other transient cerebral ischemic attacks and related syndromes: Secondary | ICD-10-CM

## 2014-11-03 DIAGNOSIS — R55 Syncope and collapse: Secondary | ICD-10-CM

## 2014-11-04 ENCOUNTER — Telehealth: Payer: Self-pay | Admitting: Neurology

## 2014-11-04 MED ORDER — CLOPIDOGREL BISULFATE 75 MG PO TABS: 75.0000 mg | ORAL_TABLET | Freq: Every day | ORAL | Status: DC

## 2014-11-04 NOTE — Telephone Encounter (Signed)
Rx has been sent.  I called the patient back.  He is aware.  

## 2014-11-04 NOTE — Telephone Encounter (Signed)
Patient requesting Rx refill for clopidogrel (PLAVIX) 75 MG tablet forwarded to [facility].  Please call and advise.

## 2014-11-09 ENCOUNTER — Encounter: Payer: Self-pay | Admitting: *Deleted

## 2014-11-09 ENCOUNTER — Encounter (INDEPENDENT_AMBULATORY_CARE_PROVIDER_SITE_OTHER): Payer: Medicare Other

## 2014-11-09 DIAGNOSIS — R55 Syncope and collapse: Secondary | ICD-10-CM | POA: Diagnosis not present

## 2014-11-09 DIAGNOSIS — G458 Other transient cerebral ischemic attacks and related syndromes: Secondary | ICD-10-CM

## 2014-11-09 DIAGNOSIS — G459 Transient cerebral ischemic attack, unspecified: Secondary | ICD-10-CM

## 2014-11-09 NOTE — Progress Notes (Signed)
Patient ID: Jose [name]., male   DOB: 01-23-1936, 79 y.o.   MRN: [id number] [employer] 30 day cardiac event monitor applied to patient.

## 2014-11-22 ENCOUNTER — Telehealth: Payer: Self-pay

## 2014-11-22 MED ORDER — APIXABAN 5 MG PO TABS: 5.0000 mg | ORAL_TABLET | Freq: Two times a day (BID) | ORAL | Status: DC

## 2014-11-22 NOTE — Telephone Encounter (Signed)
I called patient. The patient has had episodes of atrial fibrillation on the prolonged cardiac monitor. He will continue the monitoring period. Given the history of episodes consistent with TIA events, the patient will go off of Plavix, and he will start Eliquis.

## 2014-11-22 NOTE — Telephone Encounter (Signed)
[facility]  [employer].  Mr. Pereira report has been scanned into epic under EKG. Dr. All red  wants Dr.Willis to look at EKG.  If Dr.Willis has any questions please call.

## 2014-11-24 ENCOUNTER — Inpatient Hospital Stay (HOSPITAL_BASED_OUTPATIENT_CLINIC_OR_DEPARTMENT_OTHER)
Admission: EM | Admit: 2014-11-24 | Discharge: 2014-11-29 | DRG: 195 | Disposition: A | Discharge status: Home / Self Care | Payer: Medicare Other | Attending: Internal Medicine | Admitting: Internal Medicine

## 2014-11-24 ENCOUNTER — Encounter (HOSPITAL_BASED_OUTPATIENT_CLINIC_OR_DEPARTMENT_OTHER): Payer: Self-pay

## 2014-11-24 ENCOUNTER — Telehealth: Payer: Self-pay | Admitting: Neurology

## 2014-11-24 ENCOUNTER — Emergency Department (HOSPITAL_BASED_OUTPATIENT_CLINIC_OR_DEPARTMENT_OTHER): Payer: Medicare Other

## 2014-11-24 DIAGNOSIS — R739 Hyperglycemia, unspecified: Secondary | ICD-10-CM | POA: Diagnosis present

## 2014-11-24 DIAGNOSIS — D472 Monoclonal gammopathy: Secondary | ICD-10-CM | POA: Diagnosis present

## 2014-11-24 DIAGNOSIS — Z87891 Personal history of nicotine dependence: Secondary | ICD-10-CM | POA: Diagnosis not present

## 2014-11-24 DIAGNOSIS — Z808 Family history of malignant neoplasm of other organs or systems: Secondary | ICD-10-CM

## 2014-11-24 DIAGNOSIS — I480 Paroxysmal atrial fibrillation: Secondary | ICD-10-CM | POA: Diagnosis present

## 2014-11-24 DIAGNOSIS — G629 Polyneuropathy, unspecified: Secondary | ICD-10-CM | POA: Diagnosis present

## 2014-11-24 DIAGNOSIS — Z8673 Personal history of transient ischemic attack (TIA), and cerebral infarction without residual deficits: Secondary | ICD-10-CM | POA: Diagnosis not present

## 2014-11-24 DIAGNOSIS — I10 Essential (primary) hypertension: Secondary | ICD-10-CM | POA: Diagnosis present

## 2014-11-24 DIAGNOSIS — J019 Acute sinusitis, unspecified: Secondary | ICD-10-CM | POA: Diagnosis present

## 2014-11-24 DIAGNOSIS — R0602 Shortness of breath: Secondary | ICD-10-CM | POA: Diagnosis present

## 2014-11-24 DIAGNOSIS — Z8249 Family history of ischemic heart disease and other diseases of the circulatory system: Secondary | ICD-10-CM

## 2014-11-24 DIAGNOSIS — Z85828 Personal history of other malignant neoplasm of skin: Secondary | ICD-10-CM | POA: Diagnosis not present

## 2014-11-24 DIAGNOSIS — E785 Hyperlipidemia, unspecified: Secondary | ICD-10-CM | POA: Diagnosis present

## 2014-11-24 DIAGNOSIS — J189 Pneumonia, unspecified organism: Principal | ICD-10-CM | POA: Diagnosis present

## 2014-11-24 DIAGNOSIS — G458 Other transient cerebral ischemic attacks and related syndromes: Secondary | ICD-10-CM

## 2014-11-24 DIAGNOSIS — I739 Peripheral vascular disease, unspecified: Secondary | ICD-10-CM | POA: Diagnosis present

## 2014-11-24 DIAGNOSIS — G459 Transient cerebral ischemic attack, unspecified: Secondary | ICD-10-CM | POA: Diagnosis present

## 2014-11-24 DIAGNOSIS — Y95 Nosocomial condition: Secondary | ICD-10-CM | POA: Diagnosis present

## 2014-11-24 DIAGNOSIS — R0902 Hypoxemia: Secondary | ICD-10-CM | POA: Diagnosis present

## 2014-11-24 DIAGNOSIS — I4891 Unspecified atrial fibrillation: Secondary | ICD-10-CM

## 2014-11-24 HISTORY — DX: Pneumonia, unspecified organism: J18.9

## 2014-11-24 LAB — I-STAT ARTERIAL BLOOD GAS, ED
Acid-Base Excess: 3 mmol/L — ABNORMAL HIGH (ref 0.0–2.0)
Bicarbonate: 27.7 mEq/L — ABNORMAL HIGH (ref 20.0–24.0)
O2 Saturation: 95 %
PO2 ART: 70 mmHg — AB (ref 80.0–100.0)
Patient temperature: 97.7
TCO2: 29 mmol/L (ref 0–100)
pCO2 arterial: 39.9 mmHg (ref 35.0–45.0)
pH, Arterial: 7.447 (ref 7.350–7.450)

## 2014-11-24 LAB — CBC
HCT: 41.3 % (ref 39.0–52.0)
HEMOGLOBIN: 13.7 g/dL (ref 13.0–17.0)
MCH: 31.7 pg (ref 26.0–34.0)
MCHC: 33.2 g/dL (ref 30.0–36.0)
MCV: 95.6 fL (ref 78.0–100.0)
PLATELETS: 285 10*3/uL (ref 150–400)
RBC: 4.32 MIL/uL (ref 4.22–5.81)
RDW: 13.2 % (ref 11.5–15.5)
WBC: 11.2 10*3/uL — AB (ref 4.0–10.5)

## 2014-11-24 LAB — BASIC METABOLIC PANEL
Anion gap: 6 (ref 5–15)
BUN: 26 mg/dL — ABNORMAL HIGH (ref 6–23)
CHLORIDE: 102 mmol/L (ref 96–112)
CO2: 28 mmol/L (ref 19–32)
Calcium: 8.9 mg/dL (ref 8.4–10.5)
Creatinine, Ser: 1.11 mg/dL (ref 0.50–1.35)
GFR calc Af Amer: 72 mL/min — ABNORMAL LOW (ref >90)
GFR, EST NON AFRICAN AMERICAN: 62 mL/min — AB (ref >90)
GLUCOSE: 144 mg/dL — AB (ref 70–99)
POTASSIUM: 3.8 mmol/L (ref 3.5–5.1)
Sodium: 136 mmol/L (ref 135–145)

## 2014-11-24 LAB — TROPONIN I

## 2014-11-24 MED ORDER — ACETAMINOPHEN 650 MG RE SUPP: 650.0000 mg | Freq: Four times a day (QID) | RECTAL | Status: DC | PRN

## 2014-11-24 MED ORDER — SODIUM CHLORIDE 0.9 % IV SOLN
1000.0000 mL | Freq: Once | INTRAVENOUS | Status: AC
  Administered 2014-11-24: 1000 mL via INTRAVENOUS

## 2014-11-24 MED ORDER — ACETAMINOPHEN 325 MG PO TABS: 650.0000 mg | ORAL_TABLET | Freq: Four times a day (QID) | ORAL | Status: DC | PRN

## 2014-11-24 MED ORDER — IPRATROPIUM BROMIDE 0.02 % IN SOLN
RESPIRATORY_TRACT | Status: AC
  Filled 2014-11-24: qty 2.5

## 2014-11-24 MED ORDER — SODIUM CHLORIDE 0.9 % IJ SOLN
3.0000 mL | Freq: Two times a day (BID) | INTRAMUSCULAR | Status: DC
  Administered 2014-11-26 – 2014-11-28 (×5): 3 mL via INTRAVENOUS

## 2014-11-24 MED ORDER — SODIUM CHLORIDE 0.9 % IV SOLN
1000.0000 mL | INTRAVENOUS | Status: DC
  Administered 2014-11-24 – 2014-11-25 (×2): 1000 mL via INTRAVENOUS

## 2014-11-24 MED ORDER — VANCOMYCIN HCL IN DEXTROSE 750-5 MG/150ML-% IV SOLN
750.0000 mg | Freq: Two times a day (BID) | INTRAVENOUS | Status: DC
  Administered 2014-11-25 – 2014-11-27 (×5): 750 mg via INTRAVENOUS
  Filled 2014-11-24 (×7): qty 150

## 2014-11-24 MED ORDER — ASPIRIN 81 MG PO CHEW
324.0000 mg | CHEWABLE_TABLET | Freq: Once | ORAL | Status: DC
  Filled 2014-11-24: qty 4

## 2014-11-24 MED ORDER — VANCOMYCIN HCL IN DEXTROSE 1-5 GM/200ML-% IV SOLN
1000.0000 mg | Freq: Once | INTRAVENOUS | Status: AC
  Administered 2014-11-24: 1000 mg via INTRAVENOUS
  Filled 2014-11-24: qty 200

## 2014-11-24 MED ORDER — VITAMIN B-1 100 MG PO TABS
100.0000 mg | ORAL_TABLET | Freq: Every day | ORAL | Status: DC
  Administered 2014-11-25 – 2014-11-29 (×5): 100 mg via ORAL
  Filled 2014-11-24 (×6): qty 1

## 2014-11-24 MED ORDER — AMLODIPINE BESYLATE 10 MG PO TABS
10.0000 mg | ORAL_TABLET | Freq: Every day | ORAL | Status: DC
  Administered 2014-11-25 – 2014-11-29 (×5): 10 mg via ORAL
  Filled 2014-11-24 (×6): qty 1

## 2014-11-24 MED ORDER — CENTRUM SILVER PO TABS: ORAL_TABLET | Freq: Every day | ORAL | Status: DC

## 2014-11-24 MED ORDER — SODIUM CHLORIDE 0.9 % IJ SOLN
3.0000 mL | INTRAMUSCULAR | Status: DC | PRN
  Administered 2014-11-28: 3 mL via INTRAVENOUS
  Filled 2014-11-24: qty 3

## 2014-11-24 MED ORDER — ONDANSETRON HCL 4 MG PO TABS: 4.0000 mg | ORAL_TABLET | Freq: Four times a day (QID) | ORAL | Status: DC | PRN

## 2014-11-24 MED ORDER — GABAPENTIN 100 MG PO CAPS
100.0000 mg | ORAL_CAPSULE | Freq: Two times a day (BID) | ORAL | Status: DC
  Administered 2014-11-24 – 2014-11-25 (×2): 100 mg via ORAL
  Filled 2014-11-24 (×3): qty 1

## 2014-11-24 MED ORDER — OXYCODONE HCL 5 MG PO TABS: 5.0000 mg | ORAL_TABLET | ORAL | Status: DC | PRN

## 2014-11-24 MED ORDER — METHYLPREDNISOLONE SODIUM SUCC 125 MG IJ SOLR
60.0000 mg | Freq: Two times a day (BID) | INTRAMUSCULAR | Status: DC
  Administered 2014-11-24: 60 mg via INTRAVENOUS
  Filled 2014-11-24: qty 2
  Filled 2014-11-24 (×2): qty 0.96
  Filled 2014-11-24: qty 2

## 2014-11-24 MED ORDER — SODIUM CHLORIDE 0.9 % IJ SOLN
3.0000 mL | Freq: Two times a day (BID) | INTRAMUSCULAR | Status: DC
  Administered 2014-11-26 – 2014-11-29 (×4): 3 mL via INTRAVENOUS

## 2014-11-24 MED ORDER — IPRATROPIUM BROMIDE 0.02 % IN SOLN
0.5000 mg | Freq: Once | RESPIRATORY_TRACT | Status: AC
  Administered 2014-11-24: 0.5 mg via RESPIRATORY_TRACT

## 2014-11-24 MED ORDER — APIXABAN 5 MG PO TABS
5.0000 mg | ORAL_TABLET | Freq: Two times a day (BID) | ORAL | Status: DC
  Administered 2014-11-24 – 2014-11-29 (×10): 5 mg via ORAL
  Filled 2014-11-24 (×11): qty 1

## 2014-11-24 MED ORDER — CEFEPIME HCL 1 G IJ SOLR
INTRAMUSCULAR | Status: AC
  Filled 2014-11-24: qty 1

## 2014-11-24 MED ORDER — DEXTROSE 5 % IV SOLN
1.0000 g | Freq: Once | INTRAVENOUS | Status: AC
  Administered 2014-11-24: 1 g via INTRAVENOUS

## 2014-11-24 MED ORDER — OMEGA-3-ACID ETHYL ESTERS 1 G PO CAPS
1.0000 g | ORAL_CAPSULE | Freq: Two times a day (BID) | ORAL | Status: DC
  Administered 2014-11-24 – 2014-11-29 (×10): 1 g via ORAL
  Filled 2014-11-24 (×11): qty 1

## 2014-11-24 MED ORDER — SODIUM CHLORIDE 0.9 % IV SOLN
250.0000 mL | INTRAVENOUS | Status: DC | PRN
  Administered 2014-11-28: 250 mL via INTRAVENOUS

## 2014-11-24 MED ORDER — ALBUTEROL SULFATE (2.5 MG/3ML) 0.083% IN NEBU
INHALATION_SOLUTION | RESPIRATORY_TRACT | Status: AC
  Filled 2014-11-24: qty 6

## 2014-11-24 MED ORDER — LABETALOL HCL 200 MG PO TABS
200.0000 mg | ORAL_TABLET | Freq: Two times a day (BID) | ORAL | Status: DC
  Administered 2014-11-25: 100 mg via ORAL
  Filled 2014-11-24 (×3): qty 1

## 2014-11-24 MED ORDER — ADULT MULTIVITAMIN W/MINERALS CH
1.0000 | ORAL_TABLET | Freq: Every day | ORAL | Status: DC
  Administered 2014-11-25 – 2014-11-29 (×5): 1 via ORAL
  Filled 2014-11-24 (×5): qty 1

## 2014-11-24 MED ORDER — DEXTROSE 5 % IV SOLN
1.0000 g | Freq: Three times a day (TID) | INTRAVENOUS | Status: DC
  Administered 2014-11-24 – 2014-11-29 (×14): 1 g via INTRAVENOUS
  Filled 2014-11-24 (×16): qty 1

## 2014-11-24 MED ORDER — ALBUTEROL SULFATE (2.5 MG/3ML) 0.083% IN NEBU
5.0000 mg | INHALATION_SOLUTION | Freq: Once | RESPIRATORY_TRACT | Status: AC
  Administered 2014-11-24: 5 mg via RESPIRATORY_TRACT

## 2014-11-24 MED ORDER — ONDANSETRON HCL 4 MG/2ML IJ SOLN: 4.0000 mg | Freq: Four times a day (QID) | INTRAMUSCULAR | Status: DC | PRN

## 2014-11-24 MED ORDER — IPRATROPIUM-ALBUTEROL 0.5-2.5 (3) MG/3ML IN SOLN
3.0000 mL | Freq: Four times a day (QID) | RESPIRATORY_TRACT | Status: DC
  Administered 2014-11-24 – 2014-11-26 (×5): 3 mL via RESPIRATORY_TRACT
  Filled 2014-11-24 (×4): qty 3

## 2014-11-24 MED ORDER — FOLIC ACID 1 MG PO TABS
1.0000 mg | ORAL_TABLET | Freq: Every day | ORAL | Status: DC
  Administered 2014-11-24 – 2014-11-29 (×6): 1 mg via ORAL
  Filled 2014-11-24 (×6): qty 1

## 2014-11-24 MED ORDER — ZOLPIDEM TARTRATE 5 MG PO TABS: 5.0000 mg | ORAL_TABLET | Freq: Every evening | ORAL | Status: DC | PRN

## 2014-11-24 NOTE — ED Provider Notes (Signed)
CSN: [id number]     Arrival date & time 11/24/14  1354 History   First MD Initiated Contact with Patient 11/24/14 1412     Chief Complaint  Patient presents with  . Chest Pain     (Consider location/radiation/quality/duration/timing/severity/associated sxs/prior Treatment) HPI    This is a 79 year old male who presents emergency Department with chief complaint of severe coughing and feelings of choking. The patient was diagnosed with bronchitis, hypoxia and severe sinus infection and admitted to the hospital in [place] last week. He states that at that time, he consistently had low oxygen levels, however, was discharged with an eye ambulating, oxygen saturation of 91%. He is currently taking an antibiotic twice daily, but is unsure of the medication. Patient is also currently wearing a heart monitor for evaluation of a TIA that occurred in December 2015. He was called by his neurologist yesterday because he had a run of atrial fibrillation and begin taking Eliquis. The patient states that he has had consistent cough and sinus congestion. Yesterday and today. The patient has had several episodes of feeling as if a mucous plug is In his airway and having feelings of suffocation with severe paroxysms of cough lasting 15-20 minutes at a time. Patient states that he was afraid he would suffocate to death. On any oxygen at home but was found to be hypoxic here in the emergency department. He states that his cough does not seem to be improving but has not necessarily gotten worse. He states that his cough is not improving after discharge, however, he denies worsening. The patient home from [place] yesterday but denies any unilateral leg swelling, history of DVT or pulmonary embolus, pleuritic chest pain or other symptoms of pulmonary embolus.  Past Medical History  Diagnosis Date  . History of CT scan of head 1997    small vessel disease  . Gallstones 07/1996  . History of ETT 05/2002     no EKG  changes  . History of MRI of cervical spine 07/15/2002  . Rectal bleeding 12/2002    colonoscopy  . Hypertension   . MGUS (monoclonal gammopathy of unknown significance) 04/11/2012  . Skin cancer   . Seizures     last seizure 1976  . TIA (transient ischemic attack)   . Peripheral neuropathy    Past Surgical History  Procedure Laterality Date  . Skin cancer excision  07/1993    left lateral arm  . Keratosis excision  05/1998    left hand  . Ulnar nerve transfer  12/1999    left  . Colonoscopy w/ polypectomy  04/19/2003    tubular adenoma  . Shoulder injection  June 2012    left, [place]  . Thumb injection  June 2012    left, [place]  . Great toe arthrodesis, interphalangeal joint  1978  . Tonsillectomy    . Adenoidectomy    . Cataract extraction Bilateral    Family History  Problem Relation Age of Onset  . Dementia Mother 71    vascular  . Transient ischemic attack Mother   . Dementia Father     after hip fracture  . Hypertension Sister     skin cancer  . Seizures Neg Hx    History  Substance Use Topics  . Smoking status: Former Smoker -- 15 years    Quit date: 03/05/1968  . Smokeless tobacco: Never Used  . Alcohol Use: 0.5 oz/week    1 Not specified per week    Review of Systems  Ten systems reviewed and are negative for acute change, except as noted in the HPI.    Allergies  Tetracycline hcl  Home Medications   Prior to Admission medications   Medication Sig Start Date End Date Taking? Authorizing Provider  amLODipine (NORVASC) 10 MG tablet Take 1 tablet (10 mg total) by mouth daily. 07/10/11   Candelaria Celeste, MD  apixaban (ELIQUIS) 5 MG TABS tablet Take 1 tablet (5 mg total) by mouth 2 (two) times daily. 11/22/14   Kathrynn Ducking, MD  GABAPENTIN PO Take by mouth 2 (two) times daily.    Historical Provider, MD  labetalol (NORMODYNE) 200 MG tablet Take 200 mg by mouth 2 (two) times daily. Taking one half tab two times a day.    Historical Provider, MD  LOSARTAN  POTASSIUM PO Take 1 tablet by mouth 2 (two) times daily.     Historical Provider, MD  Multiple Vitamins-Minerals (CENTRUM SILVER PO) Take 1 tablet by mouth daily.     Historical Provider, MD  Omega-3 Fatty Acids (FISH OIL CONCENTRATE) 1000 MG CAPS Take 2 capsules by mouth daily.     Historical Provider, MD  Red Yeast Rice Extract (RED YEAST RICE PO) Take 2 tablets by mouth daily.    Historical Provider, MD   BP 127/78 mmHg  Pulse 91  Temp(Src) 97.7 F (36.5 C) (Oral)  Resp 18  Ht 6' (1.829 m)  Wt 229 lb (103.874 kg)  BMI 31.05 kg/m2  SpO2 96% Physical Exam  Constitutional: He is oriented to person, place, and time. He appears well-developed and well-nourished. No distress.  HENT:  Head: Normocephalic and atraumatic.  Eyes: Conjunctivae are normal. No scleral icterus.  Neck: Normal range of motion. Neck supple.  Cardiovascular: Normal rate, regular rhythm and normal heart sounds.   Pulmonary/Chest: Effort normal. No respiratory distress. He has wheezes.  Thick Ronchi   Abdominal: Soft. Bowel sounds are normal. There is no tenderness.  Musculoskeletal: Normal range of motion. He exhibits no edema.  Neurological: He is alert and oriented to person, place, and time.  Skin: Skin is warm and dry. He is not diaphoretic.  Psychiatric: His behavior is normal.  Nursing note and vitals reviewed.      ED Course  Procedures (including critical care time) Labs Review Labs Reviewed  BASIC METABOLIC PANEL - Abnormal; Notable for the following:    Glucose, Bld 144 (*)    BUN 26 (*)    GFR calc non Af Amer 62 (*)    GFR calc Af Amer 72 (*)    All other components within normal limits  CBC - Abnormal; Notable for the following:    WBC 11.2 (*)    All other components within normal limits  I-STAT ARTERIAL BLOOD GAS, ED - Abnormal; Notable for the following:    pO2, Arterial 70.0 (*)    Bicarbonate 27.7 (*)    Acid-Base Excess 3.0 (*)    All other components within normal limits   CULTURE, BLOOD (ROUTINE X 2)  CULTURE, BLOOD (ROUTINE X 2)  TROPONIN I  BLOOD GAS, ARTERIAL    Imaging Review Dg Chest 2 View  11/24/2014   CLINICAL DATA:  Productive cough and dyspnea for 1 week.  EXAM: CHEST  2 VIEW  COMPARISON:  10/13/2014  FINDINGS: There is posterior left base opacity and mild left hemidiaphragm elevation, new. The right lung is clear. There are no effusions. Pulmonary vasculature is normal. Hilar, mediastinal and cardiac contours appear unremarkable and unchanged.  IMPRESSION:  New left posterior base opacity and volume loss. This could represent pneumonia or atelectasis. No effusion.   Electronically Signed   By: Andreas Newport M.D.   On: 11/24/2014 14:44     EKG Interpretation   Date/Time:  Wednesday November 24 2014 14:01:38 EST Ventricular Rate:  96 PR Interval:  168 QRS Duration: 90 QT Interval:  364 QTC Calculation: 459 R Axis:   -36 Text Interpretation:  Normal sinus rhythm Left axis deviation Abnormal ECG  agree. no change Confirmed by Johnney Killian, MD, Jeannie Done [phone]) on 11/24/2014  2:44:30 PM Also confirmed by Rogene Houston  MD, [place] [phone])  on 11/24/2014  3:09:11 PM      MDM   Final diagnoses:  HCAP (healthcare-associated pneumonia)  Hypoxia    Patient with hypoxia, keeping his oxygen saturations above 90% with 2 L via nasal cannula. His blood/shows a compensated respiratory acidosis. Glucose is slightly elevated and his white blood cell count is 11,000. Chest x-ray shows a new posterior base opacity. This is likely healthcare acquired pneumonia. His EKG shows no significant changes from previous. He denies any chest pain. I doubt pulmonary embolus as cause of his hypoxia, although the patient did just travel on an airplane from [place] and has recently been hospitalized. Given the fact that the patient has significant coughing without chest pain, wheezing, and rhonchi. I suspect this as the cause of his hypoxia today. I discussed this with Dr.  Grandville Silos, the accepting physician. Dr. Venita Sheffield has seen the patient and agrees with admission. Patient is stable for transfer to [place], [place] 11/24/14 1630

## 2014-11-24 NOTE — ED Notes (Signed)
[facility] here to transfer pt to [facility]. Report given to [facility].

## 2014-11-24 NOTE — H&P (Signed)
[facility] History and Physical  Jose [name]. LPF:[id number] DOB: 1936-03-25 DOA: 11/24/2014  Referring physician: Fredia Sorrow, MD PCP: Karlene Einstein, MD   Chief Complaint: Cough Shortness of Breath  HPI: Jose Yniguez. is a 79 y.o. male presents with cough and shortness of breath. Patient states he was up in [place] recently visiting a friend. Patient states he ended up getting admitted with a pneumonia/bronchitis up there. Patient spent about 2 days in the hospital at that time. Patient states he went back to his stepsons house. After about a day he drove home. Patient states that since he has been home he has noted increased cough and sputum production. He states that he has had no blood in his sputum. He states that he has had shortness of breath when he exerts. He states he has had fever initially in [place] but not recently. He states he had went to his regular doctor and had no fever noted. In addition he had a TIA in December and has been on a monitor for this. He has no further symptoms since December. He states that he was told he has been having some issues with atrial fibrillation also.   Review of Systems:  Constitutional:  No weight loss, night sweats, Fevers, chills, ++fatigue.  HEENT:  No headaches, ++Sore throat, No sneezing, itching, ear ache, ++nasal congestion, post nasal drip,  Cardio-vascular:  No chest pain, Orthopnea, PND, swelling in lower extremities  GI:  No heartburn, indigestion, abdominal pain, nausea, vomiting, diarrhea  Resp:  ++shortness of breath with exertion. ++productive cough, No coughing up of blood  Skin:  no rash or lesions GU:  no dysuria, change in color of urine, no urgency or frequency  Musculoskeletal:  No joint pain or swelling. No decreased range of motion  Psych:  No change in mood or affect. No depression or anxiety   Past Medical History  Diagnosis Date  . History of CT scan of head 1997    small vessel  disease  . Gallstones 07/1996  . History of ETT 05/2002     no EKG changes  . History of MRI of cervical spine 07/15/2002  . Rectal bleeding 12/2002    colonoscopy  . Hypertension   . MGUS (monoclonal gammopathy of unknown significance) 04/11/2012  . Skin cancer   . Seizures     last seizure 1976  . TIA (transient ischemic attack)   . Peripheral neuropathy    Past Surgical History  Procedure Laterality Date  . Skin cancer excision  07/1993    left lateral arm  . Keratosis excision  05/1998    left hand  . Ulnar nerve transfer  12/1999    left  . Colonoscopy w/ polypectomy  04/19/2003    tubular adenoma  . Shoulder injection  June 2012    left, [place]  . Thumb injection  June 2012    left, [place]  . Great toe arthrodesis, interphalangeal joint  1978  . Tonsillectomy    . Adenoidectomy    . Cataract extraction Bilateral    Social History:  reports that he quit smoking about 46 years ago. He has never used smokeless tobacco. He reports that he drinks about 0.5 oz of alcohol per week. He reports that he does not use illicit drugs.  Allergies  Allergen Reactions  . Tetracycline Hcl     REACTION: rash: fixed drug reaction    Family History  Problem Relation Age of Onset  . Dementia Mother  94    vascular  . Transient ischemic attack Mother   . Dementia Father     after hip fracture  . Hypertension Sister     skin cancer  . Seizures Neg Hx      Prior to Admission medications   Medication Sig Start Date End Date Taking? Authorizing Provider  amLODipine (NORVASC) 10 MG tablet Take 1 tablet (10 mg total) by mouth daily. 07/10/11   Candelaria Celeste, MD  apixaban (ELIQUIS) 5 MG TABS tablet Take 1 tablet (5 mg total) by mouth 2 (two) times daily. 11/22/14   Kathrynn Ducking, MD  GABAPENTIN PO Take by mouth 2 (two) times daily.    Historical Provider, MD  labetalol (NORMODYNE) 200 MG tablet Take 200 mg by mouth 2 (two) times daily. Taking one half tab two times a day.    Historical  Provider, MD  LOSARTAN POTASSIUM PO Take 1 tablet by mouth 2 (two) times daily.     Historical Provider, MD  Multiple Vitamins-Minerals (CENTRUM SILVER PO) Take 1 tablet by mouth daily.     Historical Provider, MD  Omega-3 Fatty Acids (FISH OIL CONCENTRATE) 1000 MG CAPS Take 2 capsules by mouth daily.     Historical Provider, MD  Red Yeast Rice Extract (RED YEAST RICE PO) Take 2 tablets by mouth daily.    Historical Provider, MD   Physical Exam: Filed Vitals:   11/24/14 1508 11/24/14 1625 11/24/14 1713 11/24/14 1836  BP:  137/83 137/80 147/90  Pulse:  88 92 79  Temp:    98.3 F (36.8 C)  TempSrc:    Oral  Resp:  18 18 17   Height:      Weight:      SpO2: 96% 94% 94% 97%    Wt Readings from Last 3 Encounters:  11/24/14 103.874 kg (229 lb)  10/12/14 104.101 kg (229 lb 8 oz)  10/13/14 103.874 kg (229 lb)    General:  Appears calm and comfortable Eyes: PERRL, normal lids, irises & conjunctiva ENT: grossly normal hearing, lips & tongue Neck: no LAD, masses or thyromegaly Cardiovascular: Currently RRR, no m/r/g. No LE edema. Respiratory: Normal respiratory effort. Diffuse ronchi noted both lung fields Abdomen: soft, ntnd Skin: no rash or induration seen on limited exam Musculoskeletal: grossly normal tone BUE/BLE Psychiatric: grossly normal mood and affect, speech fluent and appropriate Neurologic: grossly non-focal.          Labs on Admission:  Basic Metabolic Panel:  Recent Labs Lab 11/24/14 1410  NA 136  K 3.8  CL 102  CO2 28  GLUCOSE 144*  BUN 26*  CREATININE 1.11  CALCIUM 8.9   Liver Function Tests: No results for input(s): AST, ALT, ALKPHOS, BILITOT, PROT, ALBUMIN in the last 168 hours. No results for input(s): LIPASE, AMYLASE in the last 168 hours. No results for input(s): AMMONIA in the last 168 hours. CBC:  Recent Labs Lab 11/24/14 1410  WBC 11.2*  HGB 13.7  HCT 41.3  MCV 95.6  PLT 285   Cardiac Enzymes:  Recent Labs Lab 11/24/14 1410    TROPONINI <0.03    BNP (last 3 results) No results for input(s): PROBNP in the last 8760 hours. CBG: No results for input(s): GLUCAP in the last 168 hours.  Radiological Exams on Admission: Dg Chest 2 View  11/24/2014   CLINICAL DATA:  Productive cough and dyspnea for 1 week.  EXAM: CHEST  2 VIEW  COMPARISON:  10/13/2014  FINDINGS: There is posterior left base opacity  and mild left hemidiaphragm elevation, new. The right lung is clear. There are no effusions. Pulmonary vasculature is normal. Hilar, mediastinal and cardiac contours appear unremarkable and unchanged.  IMPRESSION: New left posterior base opacity and volume loss. This could represent pneumonia or atelectasis. No effusion.   Electronically Signed   By: Andreas Newport M.D.   On: 11/24/2014 14:44     Assessment/Plan Principal Problem:   HCAP (healthcare-associated pneumonia) Active Problems:   HYPERTENSION, BENIGN SYSTEMIC   TIA (transient ischemic attack)   Hyperlipidemia   1. HCAP -cultures have been drawn -will start on broad spectrum antibiotics cefepime and Vanc -will also start on nebs as he does have significant wheeze in addition to IVsteroids -will check sputum cultures  2. Hypertension -need his home medications -will have pharmacy review as dosages are not clear and he states his wife has the dosing  3. TIA -under workup now -will follow up as an outpatient  4. Hyperlipidemia -will check lipid panel -currently not on statins  5. Atrial Fibrillation -on eloquis will continue -will continue with telemetry monitoring  6. Elevated Glucose -will check A1C -will monitor FSBS  Code Status: Full Code (must indicate code status--if unknown or must be presumed, indicate so) DVT Prophylaxis:Eloquis Family Communication: None (indicate person spoken with, if applicable, with phone number if by telephone) Disposition Plan: Home (indicate anticipated LOS)  Time spent: 78min  Zahava Quant A [facility] Pager [phone]

## 2014-11-24 NOTE — Progress Notes (Signed)
Patient recently hospitalized last week in [place] recently for bronchitis, hypoxia, sinusitis, was placed on 2 antibiotics which patient can remember. Patient is presenting to the metastases in the hypo-in ED with hypoxia with sats of 87% on room air productive cough and feeling of choking on his mucus with expiratory wheezing. Chest x-ray worrisome for posterior pneumonia. [facility] as to admit the patient to achieve a healthcare associated pneumonia. Patient also noted to be wearing an event monitor for TIA workup which was begun in December 2015. The ED PA patient was recently called by his neurologist, Dr. Jannifer Franklin who started patient on eliquis for new onset atrial fibrillation. Patient has been accepted to a telemetry.

## 2014-11-24 NOTE — ED Notes (Signed)
Patient transported to X-ray via stretcher per tech. 

## 2014-11-24 NOTE — ED Notes (Signed)
Pt transported by [facility] to [facility].

## 2014-11-24 NOTE — ED Notes (Signed)
Placed on cardiac, bp and pulse ox monitoring.

## 2014-11-24 NOTE — ED Provider Notes (Signed)
Medical screening examination/treatment/procedure(s) were conducted as a shared visit with non-physician practitioner(s) and myself.  I personally evaluated the patient during the encounter.   EKG Interpretation   Date/Time:  Wednesday November 24 2014 14:01:38 EST Ventricular Rate:  96 PR Interval:  168 QRS Duration: 90 QT Interval:  364 QTC Calculation: 459 R Axis:   -36 Text Interpretation:  Normal sinus rhythm Left axis deviation Abnormal ECG  agree. no change Confirmed by Johnney Killian, MD, Jeannie Done [phone]) on 11/24/2014  2:44:30 PM Also confirmed by Rogene Houston  MD, [place] [phone])  on 11/24/2014  3:09:11 PM     Results for orders placed or performed during the hospital encounter of 82/95/62  Basic metabolic panel  Result Value Ref Range   Sodium 136 135 - 145 mmol/L   Potassium 3.8 3.5 - 5.1 mmol/L   Chloride 102 96 - 112 mmol/L   CO2 28 19 - 32 mmol/L   Glucose, Bld 144 (H) 70 - 99 mg/dL   BUN 26 (H) 6 - 23 mg/dL   Creatinine, Ser 1.11 0.50 - 1.35 mg/dL   Calcium 8.9 8.4 - 10.5 mg/dL   GFR calc non Af Amer 62 (L) >90 mL/min   GFR calc Af Amer 72 (L) >90 mL/min   Anion gap 6 5 - 15  CBC  Result Value Ref Range   WBC 11.2 (H) 4.0 - 10.5 K/uL   RBC 4.32 4.22 - 5.81 MIL/uL   Hemoglobin 13.7 13.0 - 17.0 g/dL   HCT 41.3 39.0 - 52.0 %   MCV 95.6 78.0 - 100.0 fL   MCH 31.7 26.0 - 34.0 pg   MCHC 33.2 30.0 - 36.0 g/dL   RDW 13.2 11.5 - 15.5 %   Platelets 285 150 - 400 K/uL  Troponin I  Result Value Ref Range   Troponin I <0.03 <0.031 ng/mL  I-Stat arterial blood gas, ED  Result Value Ref Range   pH, Arterial 7.447 7.350 - 7.450   pCO2 arterial 39.9 35.0 - 45.0 mmHg   pO2, Arterial 70.0 (L) 80.0 - 100.0 mmHg   Bicarbonate 27.7 (H) 20.0 - 24.0 mEq/L   TCO2 29 0 - 100 mmol/L   O2 Saturation 95.0 %   Acid-Base Excess 3.0 (H) 0.0 - 2.0 mmol/L   Patient temperature 97.7 F    Collection site ARTERIAL LINE    Drawn by Operator    Sample type ARTERIAL    Dg Chest 2 View  11/24/2014    CLINICAL DATA:  Productive cough and dyspnea for 1 week.  EXAM: CHEST  2 VIEW  COMPARISON:  10/13/2014  FINDINGS: There is posterior left base opacity and mild left hemidiaphragm elevation, new. The right lung is clear. There are no effusions. Pulmonary vasculature is normal. Hilar, mediastinal and cardiac contours appear unremarkable and unchanged.  IMPRESSION: New left posterior base opacity and volume loss. This could represent pneumonia or atelectasis. No effusion.   Electronically Signed   By: Andreas Newport M.D.   On: 11/24/2014 14:44    Patient known to me. Patient was admitted by me back in December for possible TIA. Patient with recent admission and evaluation in [place] no evidence of pneumonia at that time currently being treated for sinusitis. Today patient presents with complaint of shortness of breath. Patient recently started on Elocon with S4 periods of A. fib. Patient here with the marginal hypoxia on room air. Sometimes upper 80s sometimes low 90s. On oxygen doing better. Patient's chest x-rays consistent with a pneumonia. This  would be consistent with a health acquired pneumonia. Since patient was admitted recently while in [place]. Blood cultures done here patient be continued on oxygen. Patient started on and by protocol for HCAP.  Patient was some persistent wheezing. Heart here is his regular.  Fredia Sorrow, MD 11/24/14 1622

## 2014-11-24 NOTE — Telephone Encounter (Signed)
Amy with Dr. Vista Lawman office calling again regarding Dr. Jannifer Franklin returning call to Dr. Vista Lawman.  I asked her to hold for Dr. Jannifer Franklin.  Amy stated she didn't want to pull him from a room, but would like a return call today.

## 2014-11-24 NOTE — Telephone Encounter (Signed)
I tried to call Dr. Yolanda Bonine, the number listed was not a working number, I will call his office tomorrow.

## 2014-11-24 NOTE — ED Notes (Signed)
Pt reports today for 30 min pta has felt like someone "has been putting pressure on my neck", sob.  Heart monitor in place d/t possible tia vs afib per pt.

## 2014-11-24 NOTE — Progress Notes (Signed)
ANTIBIOTIC CONSULT NOTE - INITIAL  Pharmacy Consult for Vancomycin Indication: pneumonia  Allergies  Allergen Reactions  . Tetracycline Hcl     REACTION: rash: fixed drug reaction    Patient Measurements: Height: 6' (182.9 cm) Weight: 229 lb (103.874 kg) IBW/kg (Calculated) : 77.6  Vital Signs: Temp: 97.7 F (36.5 C) (01/27 1400) Temp Source: Oral (01/27 1433) BP: 127/78 mmHg (01/27 1433) Pulse Rate: 91 (01/27 1433) Intake/Output from previous day:   Intake/Output from this shift:    Labs:  Recent Labs  11/24/14 1410  WBC 11.2*  HGB 13.7  PLT 285  CREATININE 1.11   Estimated Creatinine Clearance: 69.4 mL/min (by C-G formula based on Cr of 1.11). No results for input(s): VANCOTROUGH, VANCOPEAK, VANCORANDOM, GENTTROUGH, GENTPEAK, GENTRANDOM, TOBRATROUGH, TOBRAPEAK, TOBRARND, AMIKACINPEAK, AMIKACINTROU, AMIKACIN in the last 72 hours.   Microbiology: No results found for this or any previous visit (from the past 720 hour(s)).  Medical History: Past Medical History  Diagnosis Date  . History of CT scan of head 1997    small vessel disease  . Gallstones 07/1996  . History of ETT 05/2002     no EKG changes  . History of MRI of cervical spine 07/15/2002  . Rectal bleeding 12/2002    colonoscopy  . Hypertension   . MGUS (monoclonal gammopathy of unknown significance) 04/11/2012  . Skin cancer   . Seizures     last seizure 1976  . TIA (transient ischemic attack)   . Peripheral neuropathy     Medications:   (Not in a hospital admission) Assessment: 42 YOM presenting [facility] ED on 11/24/14 c/o severe coughing and feeling of choking.  Patient was recently admitted to a hospital in [place] last week with a diagnosis of bronchitis, hypoxia, and severe sinus infection. His cough has not been improving but has not gotten worse per patient.  CXR reveals new lost posterior base opacity and volume loss, possibly representing pnemonia or atelectasis.  Pharmacy has been  consulted to dose vancomycin for pneumonmia (likely HCAP).  WBC up 11.2, afebrile, HR wnl, RR wnl.   Scr 1.11, Crcl ~65-70 mL/min  Patient received Cefepime 1 g IV x 1 at [facility] Patient will receive Vancomycin 1 g IV x 1 due to availability in their pyxis.  This is below an ideal loading dose for this patient, therefore will consider being a little more aggressive with therapy and checking trough level as soon as at steady state.    Goal of Therapy:  Vancomycin trough level 15-20 mcg/ml  Plan:  - Vancomycin 1000 mg IV x 1 ([facility]CH) - Vancomycin 750 mg IV Q12H - Monitor for clinical efficacy and trough levels when appropriate - Monitor renal function - Follow up cultures  [name], Pharm. D. Clinical Pharmacy Resident Pager: [phone] Ph: [phone] 11/24/2014 3:48 PM

## 2014-11-24 NOTE — Telephone Encounter (Signed)
Dr. Charlcie Cradle, patient's PCP @ [phone], requesting a return call from Dr. Jannifer Franklin at your earliest Convenience.

## 2014-11-25 ENCOUNTER — Inpatient Hospital Stay (HOSPITAL_COMMUNITY): Payer: Medicare Other

## 2014-11-25 ENCOUNTER — Encounter (HOSPITAL_COMMUNITY): Payer: Self-pay | Admitting: General Practice

## 2014-11-25 LAB — CBC
HEMATOCRIT: 39.3 % (ref 39.0–52.0)
Hemoglobin: 13 g/dL (ref 13.0–17.0)
MCH: 31.4 pg (ref 26.0–34.0)
MCHC: 33.1 g/dL (ref 30.0–36.0)
MCV: 94.9 fL (ref 78.0–100.0)
Platelets: 272 10*3/uL (ref 150–400)
RBC: 4.14 MIL/uL — AB (ref 4.22–5.81)
RDW: 13.3 % (ref 11.5–15.5)
WBC: 10.1 10*3/uL (ref 4.0–10.5)

## 2014-11-25 LAB — LEGIONELLA ANTIGEN, URINE

## 2014-11-25 LAB — COMPREHENSIVE METABOLIC PANEL
ALBUMIN: 3.6 g/dL (ref 3.5–5.2)
ALK PHOS: 41 U/L (ref 39–117)
ALT: 60 U/L — ABNORMAL HIGH (ref 0–53)
AST: 41 U/L — ABNORMAL HIGH (ref 0–37)
Anion gap: 10 (ref 5–15)
BUN: 18 mg/dL (ref 6–23)
CO2: 26 mmol/L (ref 19–32)
Calcium: 8.3 mg/dL — ABNORMAL LOW (ref 8.4–10.5)
Chloride: 103 mmol/L (ref 96–112)
Creatinine, Ser: 1 mg/dL (ref 0.50–1.35)
GFR calc non Af Amer: 70 mL/min — ABNORMAL LOW (ref >90)
GFR, EST AFRICAN AMERICAN: 82 mL/min — AB (ref >90)
GLUCOSE: 156 mg/dL — AB (ref 70–99)
POTASSIUM: 4.6 mmol/L (ref 3.5–5.1)
Sodium: 139 mmol/L (ref 135–145)
Total Bilirubin: 0.8 mg/dL (ref 0.3–1.2)
Total Protein: 6.3 g/dL (ref 6.0–8.3)

## 2014-11-25 LAB — STREP PNEUMONIAE URINARY ANTIGEN: Strep Pneumo Urinary Antigen: NEGATIVE

## 2014-11-25 LAB — TSH: TSH: 1.096 u[IU]/mL (ref 0.350–4.500)

## 2014-11-25 LAB — INFLUENZA PANEL BY PCR (TYPE A & B)
H1N1FLUPCR: NOT DETECTED
INFLAPCR: NEGATIVE
Influenza B By PCR: NEGATIVE

## 2014-11-25 LAB — GLUCOSE, CAPILLARY: Glucose-Capillary: 133 mg/dL — ABNORMAL HIGH (ref 70–99)

## 2014-11-25 MED ORDER — GABAPENTIN 100 MG PO CAPS
200.0000 mg | ORAL_CAPSULE | Freq: Once | ORAL | Status: AC
  Administered 2014-11-25: 200 mg via ORAL
  Filled 2014-11-25: qty 2

## 2014-11-25 MED ORDER — CETYLPYRIDINIUM CHLORIDE 0.05 % MT LIQD
7.0000 mL | Freq: Two times a day (BID) | OROMUCOSAL | Status: DC
  Administered 2014-11-25 – 2014-11-28 (×7): 7 mL via OROMUCOSAL

## 2014-11-25 MED ORDER — IOHEXOL 300 MG/ML  SOLN
75.0000 mL | Freq: Once | INTRAMUSCULAR | Status: AC | PRN
  Administered 2014-11-25: 75 mL via INTRAVENOUS

## 2014-11-25 MED ORDER — GABAPENTIN 300 MG PO CAPS
300.0000 mg | ORAL_CAPSULE | Freq: Two times a day (BID) | ORAL | Status: DC
  Administered 2014-11-25 – 2014-11-29 (×8): 300 mg via ORAL
  Filled 2014-11-25 (×9): qty 1

## 2014-11-25 MED ORDER — SODIUM CHLORIDE 0.9 % IV SOLN
1000.0000 mL | INTRAVENOUS | Status: DC
  Administered 2014-11-25 (×2): 1000 mL via INTRAVENOUS

## 2014-11-25 MED ORDER — LABETALOL HCL 100 MG PO TABS
100.0000 mg | ORAL_TABLET | Freq: Two times a day (BID) | ORAL | Status: DC
  Administered 2014-11-25 – 2014-11-28 (×7): 100 mg via ORAL
  Filled 2014-11-25 (×9): qty 1

## 2014-11-25 MED ORDER — PREDNISONE 20 MG PO TABS
40.0000 mg | ORAL_TABLET | Freq: Every day | ORAL | Status: DC
  Administered 2014-11-25 – 2014-11-29 (×5): 40 mg via ORAL
  Filled 2014-11-25 (×7): qty 2

## 2014-11-25 NOTE — Progress Notes (Signed)
Utilization review completed. Kathe Wirick, RN, BSN. 

## 2014-11-25 NOTE — Progress Notes (Signed)
[facility] PROGRESS NOTE  Jose [name]. OAC:[id number] DOB: 08-01-1936 DOA: 11/24/2014 PCP: Karlene Einstein, MD  Summary I have seen and examined Jose [name] at bedside and reviewed his chart. Jose [name]. is a pleasant 79 y.o. Male with essential hypertension/monoclonal gammopathy of unknown significance/recently diagnosed atrial fibrillation on Eliquis-still caring 30 day heart monitor which picked up the atrial fibrillation, who presented with cough and shortness of breath and was found to have HCAP(hospitalized less than 90 days ago for TIA here in [place] and in [place] earlier this month for "sinus infection"). At the time of admission his chest x-ray showed "New left posterior base opacity and volume loss. This could represent pneumonia or atelectasis. No effusion". White count was 11,000. He was therefore started on vancomycin/cefepime for healthcare associated pneumonia. He was also placed on steroids. The concern is for a postobstructive process as he states that his oxygen went low at some point last month when he was managed for TIA. We'll therefore obtain CT chest with contrast and continue antibiotics, change steroids to prednisone and continue IV fluids. Plan 1. HCAP -cultures negative so far -Day 2 Cefepime and Vanc -Continue nebs and change steroids to prednisone -will check sputum cultures -CT chest with contrast  2. Hypertension -Continue home medications -Monitor creatinine  3. TIA -under workup now-has heart monitor -will follow up as an outpatient  4. Hyperlipidemia -will check lipid panel -currently not on statins  5. Atrial Fibrillation -on eloquis will continue -will continue with telemetry monitoring  6. Elevated Glucose -HbA1C 5.8 -will monitor FSBS  Code Status: Full Code  DVT Prophylaxis:Eliquis Family Communication: None (indicate person spoken with, if applicable, with phone number if by telephone) Disposition Plan: Home  (indicate anticipated LOS)   Consultants:  None  Procedures:  None  Antibiotics:  Vancomycin 11/24/2014>  Cefepime 11/24/2014>  HPI/Subjective: Feels better. Less cough.  Objective: Filed Vitals:   11/25/14 0520  BP: 137/88  Pulse: 76  Temp: 97.8 F (36.6 C)  Resp: 16    Intake/Output Summary (Last 24 hours) at 11/25/14 0813 Last data filed at 11/25/14 0600  Gross per 24 hour  Intake 1808.33 ml  Output    575 ml  Net 1233.33 ml   Filed Weights   11/24/14 1400 11/25/14 0332  Weight: 103.874 kg (229 lb) 101.5 kg (223 lb 12.3 oz)    Exam:   General:  Sitting up not in distress.  Cardiovascular: RRR. S1-S2 normal. No murmurs.  Respiratory: No wheezing. Bilateral scant rhonchi.  Abdomen: Soft and nontender. No organomegaly.  Musculoskeletal: No pedal edema.   Data Reviewed: Basic Metabolic Panel:  Recent Labs Lab 11/24/14 1410  NA 136  K 3.8  CL 102  CO2 28  GLUCOSE 144*  BUN 26*  CREATININE 1.11  CALCIUM 8.9   Liver Function Tests: No results for input(s): AST, ALT, ALKPHOS, BILITOT, PROT, ALBUMIN in the last 168 hours. No results for input(s): LIPASE, AMYLASE in the last 168 hours. No results for input(s): AMMONIA in the last 168 hours. CBC:  Recent Labs Lab 11/24/14 1410 11/25/14 0703  WBC 11.2* 10.1  HGB 13.7 13.0  HCT 41.3 39.3  MCV 95.6 94.9  PLT 285 272   Cardiac Enzymes:  Recent Labs Lab 11/24/14 1410  TROPONINI <0.03   BNP (last 3 results) No results for input(s): PROBNP in the last 8760 hours. CBG:  Recent Labs Lab 11/25/14 0749  GLUCAP 133*    Recent Results (from the past 240 hour(s))  Culture, blood (routine x 2)     Status: None (Preliminary result)   Collection Time: 11/24/14  3:20 PM  Result Value Ref Range Status   Specimen Description BLOOD RIGHT HAND  Final   Special Requests BOTTLES DRAWN AEROBIC AND ANAEROBIC 5CC EACH  Final   Culture   Final           BLOOD CULTURE RECEIVED NO GROWTH TO DATE  CULTURE WILL BE HELD FOR 5 DAYS BEFORE ISSUING A FINAL NEGATIVE REPORT Performed at [facility]    Report Status PENDING  Incomplete  Culture, blood (routine x 2)     Status: None (Preliminary result)   Collection Time: 11/24/14  3:30 PM  Result Value Ref Range Status   Specimen Description BLOOD LEFT HAND  Final   Special Requests BOTTLES DRAWN AEROBIC AND ANAEROBIC 5CC EACH  Final   Culture   Final           BLOOD CULTURE RECEIVED NO GROWTH TO DATE CULTURE WILL BE HELD FOR 5 DAYS BEFORE ISSUING A FINAL NEGATIVE REPORT Performed at [facility]    Report Status PENDING  Incomplete     Studies: Dg Chest 2 View  11/24/2014   CLINICAL DATA:  Productive cough and dyspnea for 1 week.  EXAM: CHEST  2 VIEW  COMPARISON:  10/13/2014  FINDINGS: There is posterior left base opacity and mild left hemidiaphragm elevation, new. The right lung is clear. There are no effusions. Pulmonary vasculature is normal. Hilar, mediastinal and cardiac contours appear unremarkable and unchanged.  IMPRESSION: New left posterior base opacity and volume loss. This could represent pneumonia or atelectasis. No effusion.   Electronically Signed   By: Andreas Newport M.D.   On: 11/24/2014 14:44    Scheduled Meds: . amLODipine  10 mg Oral Daily  . antiseptic oral rinse  7 mL Mouth Rinse BID  . apixaban  5 mg Oral BID  . ceFEPime (MAXIPIME) IV  1 g Intravenous 3 times per day  . folic acid  1 mg Oral Daily  . gabapentin  100 mg Oral BID  . ipratropium-albuterol  3 mL Nebulization Q6H  . labetalol  200 mg Oral BID  . multivitamin with minerals  1 tablet Oral Daily  . omega-3 acid ethyl esters  1 g Oral BID  . predniSONE  40 mg Oral Q breakfast  . sodium chloride  3 mL Intravenous Q12H  . sodium chloride  3 mL Intravenous Q12H  . thiamine  100 mg Oral Daily  . vancomycin  750 mg Intravenous Q12H   Continuous Infusions: . sodium chloride      Principal Problem:   HCAP (healthcare-associated  pneumonia) Active Problems:   HYPERTENSION, BENIGN SYSTEMIC   TIA (transient ischemic attack)   Hyperlipidemia    Time spent: 25 minutes    Jose [name]  [facility] Pager [phone]. If 7PM-7AM, please contact night-coverage at [email or web address], password [facility] 11/25/2014, 8:13 AM  LOS: 1 day

## 2014-11-25 NOTE — Discharge Instructions (Signed)

## 2014-11-25 NOTE — Telephone Encounter (Signed)
I called Dr. Vista Lawman, the patient is now on the hospital with pneumonia. The patient will be going on at a quite well in therapy. He will be referred for sleep study and for cardiology referral.

## 2014-11-26 DIAGNOSIS — R0602 Shortness of breath: Secondary | ICD-10-CM | POA: Diagnosis present

## 2014-11-26 DIAGNOSIS — R0902 Hypoxemia: Secondary | ICD-10-CM

## 2014-11-26 LAB — COMPREHENSIVE METABOLIC PANEL
ALBUMIN: 3.4 g/dL — AB (ref 3.5–5.2)
ALT: 56 U/L — AB (ref 0–53)
AST: 31 U/L (ref 0–37)
Alkaline Phosphatase: 37 U/L — ABNORMAL LOW (ref 39–117)
Anion gap: 4 — ABNORMAL LOW (ref 5–15)
BUN: 14 mg/dL (ref 6–23)
CALCIUM: 8.4 mg/dL (ref 8.4–10.5)
CO2: 32 mmol/L (ref 19–32)
CREATININE: 0.77 mg/dL (ref 0.50–1.35)
Chloride: 105 mmol/L (ref 96–112)
GFR calc Af Amer: >90 mL/min (ref >90)
GFR, EST NON AFRICAN AMERICAN: 85 mL/min — AB (ref >90)
Glucose, Bld: 111 mg/dL — ABNORMAL HIGH (ref 70–99)
POTASSIUM: 3.4 mmol/L — AB (ref 3.5–5.1)
Sodium: 141 mmol/L (ref 135–145)
TOTAL PROTEIN: 6 g/dL (ref 6.0–8.3)
Total Bilirubin: 0.9 mg/dL (ref 0.3–1.2)

## 2014-11-26 LAB — HEMOGLOBIN A1C
Hgb A1c MFr Bld: 6.1 % — ABNORMAL HIGH (ref 4.8–5.6)
MEAN PLASMA GLUCOSE: 128 mg/dL

## 2014-11-26 LAB — CBC
HCT: 38.2 % — ABNORMAL LOW (ref 39.0–52.0)
Hemoglobin: 12.8 g/dL — ABNORMAL LOW (ref 13.0–17.0)
MCH: 32.2 pg (ref 26.0–34.0)
MCHC: 33.5 g/dL (ref 30.0–36.0)
MCV: 96.2 fL (ref 78.0–100.0)
Platelets: 247 10*3/uL (ref 150–400)
RBC: 3.97 MIL/uL — ABNORMAL LOW (ref 4.22–5.81)
RDW: 13.5 % (ref 11.5–15.5)
WBC: 10.1 10*3/uL (ref 4.0–10.5)

## 2014-11-26 LAB — GLUCOSE, CAPILLARY: Glucose-Capillary: 96 mg/dL (ref 70–99)

## 2014-11-26 MED ORDER — FLUTICASONE PROPIONATE 50 MCG/ACT NA SUSP
2.0000 | Freq: Two times a day (BID) | NASAL | Status: DC
  Filled 2014-11-26: qty 16

## 2014-11-26 MED ORDER — ACETYLCYSTEINE 20 % IN SOLN
3.0000 mL | Freq: Two times a day (BID) | RESPIRATORY_TRACT | Status: AC
  Administered 2014-11-26 – 2014-11-27 (×3): 3 mL via RESPIRATORY_TRACT
  Filled 2014-11-26 (×3): qty 4

## 2014-11-26 MED ORDER — OXYMETAZOLINE HCL 0.05 % NA SOLN
1.0000 | Freq: Two times a day (BID) | NASAL | Status: DC
  Filled 2014-11-26: qty 15

## 2014-11-26 MED ORDER — ALBUTEROL SULFATE (2.5 MG/3ML) 0.083% IN NEBU
2.5000 mg | INHALATION_SOLUTION | Freq: Four times a day (QID) | RESPIRATORY_TRACT | Status: DC
  Administered 2014-11-26 – 2014-11-28 (×8): 2.5 mg via RESPIRATORY_TRACT
  Filled 2014-11-26 (×8): qty 3

## 2014-11-26 MED ORDER — POTASSIUM CHLORIDE 20 MEQ/15ML (10%) PO SOLN
40.0000 meq | Freq: Once | ORAL | Status: AC
  Administered 2014-11-26: 40 meq via ORAL
  Filled 2014-11-26: qty 30

## 2014-11-26 MED ORDER — SALINE SPRAY 0.65 % NA SOLN
2.0000 | Freq: Three times a day (TID) | NASAL | Status: DC
  Administered 2014-11-26 – 2014-11-29 (×10): 2 via NASAL
  Filled 2014-11-26: qty 44

## 2014-11-26 NOTE — Consult Note (Signed)
Name: Jose [name]. MRN: [id number] DOB: Jan 02, 1936    ADMISSION DATE:  11/24/2014 CONSULTATION DATE:  1/29  REFERRING MD :  Sanjuana Letters   CHIEF COMPLAINT:  Abnormal CT chest   BRIEF PATIENT DESCRIPTION:  79 year old male w/ h/o monoclonal gammopathy, recurrent sinusitis and pneumonias.   Recently d/c from hospital in [place] w/ dx of sinusitis and bronchitis.  Treated w/ empiric abx and d/cd on avelox. Admitted on 1/27 w/ working dx of sinusitis and HCAP. PCCM asked to see on 1/29 given CT chest finding of PNA and LLL bronchus mucous plugging.    SIGNIFICANT EVENTS    STUDIES:  CT chest 1/28: Opacification over a short segment of the basilar left lower lobe bronchus which may represent mucous plugging versus aspiration. Associated consolidation of the basilar left lower lobe which may be due to atelectasis or infection. Mild posterior dependent right basilar atelectasis.   HISTORY OF PRESENT ILLNESS:   This is a 79 year old male w/ sig h/o recurrent TIAs and also recurrent sinusitis as well as pneumonias dating back to his teen years. Was in usual state of health until 1/19, when he was up in [place], developed head congestion and cough. Went to urgent care, dx'd w/ flu (not tested) given tamiflu and abx (did not know what). Symptoms worsened (cough w/ associated dyspnea to the point he felt like he could not breath), by 1/20 he was admitted to hospital w/ working dx of sinusitis and bronchitis. He was initially treated w/ IV abx and steroids. Was discharged to home on on 1/22 w/ moxiflox. By 1/23 his cough had returned, as did significant sinus congestion. He tried treating this w/ a flutter valve provided by the hospital at d/c. This would help him clear his congestion some, however the symptoms persisted. While he was on way back to [place] has AF picked up on tele. Was notified by neurologist and was to start on Eliquis. Went to his PCP on 1/27 to review all that had happened and discuss  plan of care. He was instructed to continue his current rx, but was also to have an ENT and cards consult. As well as possible sleep study. His symptoms became unbearable due to his cough and congestion. He therefore went to [facility]. CXR showed LLL PNA. He was admitted for further treatment. Started on empiric abx to cover for HCAP (as had been hospitalized in Dec for TIA and also just left the hospital) along with bronchodilators. A CT of chest was done on 1/29 to further assess his CXR findings. This showed narrowing and mucous plugging involving the LLL bronchus. We were consulted to further evaluate these findings.   PAST MEDICAL HISTORY :   has a past medical history of History of CT scan of head (1997); Gallstones (07/1996); History of ETT (05/2002 ); History of MRI of cervical spine (07/15/2002); Rectal bleeding (12/2002); Hypertension; MGUS (monoclonal gammopathy of unknown significance) (04/11/2012); Skin cancer; Seizures; TIA (transient ischemic attack); Peripheral neuropathy; and Pneumonia (10/2014).  has past surgical history that includes Skin cancer excision (07/1993); keratosis excision (05/1998); ulnar nerve transfer (12/1999); Colonoscopy w/ polypectomy (04/19/2003); Shoulder injection (June 2012); thumb injection (June 2012); Great toe arthrodesis, interphalangeal joint (1978); Tonsillectomy; Adenoidectomy; and Cataract extraction (Bilateral). Prior to Admission medications   Medication Sig Start Date End Date Taking? Authorizing Provider  amLODipine (NORVASC) 5 MG tablet Take 5 mg by mouth daily.   Yes Historical Provider, MD  apixaban Arne Cleveland) 5  MG TABS tablet Take 1 tablet (5 mg total) by mouth 2 (two) times daily. 11/22/14  Yes Kathrynn Ducking, MD  fluticasone [facility]) 50 MCG/ACT nasal spray Place 1 spray into both nostrils daily.   Yes Historical Provider, MD  Fluticasone-Salmeterol (ADVAIR) 250-50 MCG/DOSE AEPB Inhale 1 puff into the lungs 2 (two) times daily.   Yes  Historical Provider, MD  gabapentin (NEURONTIN) 300 MG capsule Take 300 mg by mouth 2 (two) times daily.   Yes Historical Provider, MD  labetalol (NORMODYNE) 200 MG tablet Take 100 mg by mouth 2 (two) times daily.   Yes Historical Provider, MD  losartan-hydrochlorothiazide (HYZAAR) 50-12.5 MG per tablet Take 1 tablet by mouth 2 (two) times daily.   Yes Historical Provider, MD  moxifloxacin (AVELOX) 400 MG tablet Take 400 mg by mouth daily at 8 pm. On day 3   Yes Historical Provider, MD  Multiple Vitamins-Minerals (CENTRUM SILVER PO) Take 1 tablet by mouth daily.    Yes Historical Provider, MD  Multiple Vitamins-Minerals (OCUVITE ADULT 50+ PO) Take 1 tablet by mouth daily.   Yes Historical Provider, MD  Omega-3 Fatty Acids (FISH OIL CONCENTRATE) 1000 MG CAPS Take 2 capsules by mouth daily.    Yes Historical Provider, MD  predniSONE (DELTASONE) 5 MG tablet Take 30 mg by mouth daily with breakfast. For the next 3 days. Then on Saturday patient tapers down to 10 mg for 3 days   Yes Historical Provider, MD  Red Yeast Rice Extract (RED YEAST RICE PO) Take 2 tablets by mouth daily.   Yes Historical Provider, MD   Allergies  Allergen Reactions  . Tetracycline Hcl     REACTION: rash: fixed drug reaction    FAMILY HISTORY:  family history includes Dementia in his father; Dementia (age of onset: 18) in his mother; Hypertension in his sister; Transient ischemic attack in his mother. There is no history of Seizures. SOCIAL HISTORY:  reports that he quit smoking about 46 years ago. He has never used smokeless tobacco. He reports that he drinks about 0.5 oz of alcohol per week. He reports that he does not use illicit drugs.  REVIEW OF SYSTEMS:   Constitutional: Negative for fever, chills, weight loss, malaise/fatigue and diaphoresis.  HENT: Negative for hearing loss, ear pain, nosebleeds, congestion, sore throat, neck pain, tinnitus and ear discharge.   Eyes: Negative for blurred vision, double vision,  photophobia, pain, discharge and redness.  Respiratory: , no hemoptysis, sputum production grey/green, shortness of breath, metallic taste wheezing and stridor.   Cardiovascular: Negative for chest pain, palpitations, orthopnea, claudication, leg swelling and PND.  Gastrointestinal: Negative for heartburn, nausea, vomiting, abdominal pain, diarrhea, constipation, blood in stool and melena.  Genitourinary: Negative for dysuria, urgency, frequency, hematuria and flank pain.  Musculoskeletal: Negative for myalgias, back pain, joint pain and falls.  Skin: Negative for itching and rash.  Neurological: Negative for dizziness, tingling, tremors, sensory change, speech change, focal weakness, seizures, loss of consciousness, weakness and headaches.  Endo/Heme/Allergies: Negative for environmental allergies and polydipsia. Does not bruise/bleed easily.  SUBJECTIVE:  Feeling al little better  VITAL SIGNS: Temp:  [97.5 F (36.4 C)-98.3 F (36.8 C)] 97.5 F (36.4 C) (01/29 0550) Pulse Rate:  [53-74] 71 (01/29 1019) Resp:  [16-18] 18 (01/29 0550) BP: (134-141)/(76-89) 141/82 mmHg (01/29 1019) SpO2:  [94 %-98 %] 94 % (01/29 0749) Weight:  [101.878 kg (224 lb 9.6 oz)] 101.878 kg (224 lb 9.6 oz) (01/29 0550)  PHYSICAL EXAMINATION: General:  Resting comfortably, no  acute distress.  Neuro:  Awake, alert, no focal def  HEENT:  Voice hoarse/congested. + sinus pressure  Cardiovascular:  rrr Lungs:  Coarse scattered rhonchi, decreased breath sounds left lower lobe  Abdomen:  Soft, + bowel sounds  Musculoskeletal:  Intact  Skin:  Intact   Recent Labs Lab 11/24/14 1410 11/25/14 0703 11/26/14 0607  NA 136 139 141  K 3.8 4.6 3.4*  CL 102 103 105  CO2 28 26 32  BUN 26* 18 14  CREATININE 1.11 1.00 0.77  GLUCOSE 144* 156* 111*    Recent Labs Lab 11/24/14 1410 11/25/14 0703 11/26/14 0607  HGB 13.7 13.0 12.8*  HCT 41.3 39.3 38.2*  WBC 11.2* 10.1 10.1  PLT 285 272 247   Dg Chest 2  View  11/24/2014   CLINICAL DATA:  Productive cough and dyspnea for 1 week.  EXAM: CHEST  2 VIEW  COMPARISON:  10/13/2014  FINDINGS: There is posterior left base opacity and mild left hemidiaphragm elevation, new. The right lung is clear. There are no effusions. Pulmonary vasculature is normal. Hilar, mediastinal and cardiac contours appear unremarkable and unchanged.  IMPRESSION: New left posterior base opacity and volume loss. This could represent pneumonia or atelectasis. No effusion.   Electronically Signed   By: Andreas Newport M.D.   On: 11/24/2014 14:44   Ct Chest W Contrast  11/25/2014   CLINICAL DATA:  Patient was treated for sinus infection while in [place] for the past month. Patient ran fevers while being treated for the sinus infection, he has been fever free for about 2 weeks. Patient now admitted for pneumonia. Patient denies any cough, chest pain, or shortness of breath.  EXAM: CT CHEST WITH CONTRAST  TECHNIQUE: Multidetector CT imaging of the chest was performed during intravenous contrast administration.  CONTRAST:  45mL OMNIPAQUE IOHEXOL 300 MG/ML  SOLN  COMPARISON:  Chest x-ray 11/24/2014  FINDINGS: Lungs are adequately inflated and demonstrate consolidation over the basilar left lower lobe which may be due to atelectasis or infection. There is complete opacification over a short segment of the basilar left lower lobe bronchus which may be due to mucous plugging or aspiration. There are a few tiny dense foci/calcifications adjacent this lower lobe bronchus. Mild opacification over the posterior right lower lobe likely atelectasis no pleural effusion.  Heart is normal in size. There is mild calcified plaque over the left lateral circumflex coronary artery and minimally involving the left anterior descending and right coronary artery. Mild calcified plaque over the thoracic aorta. There is no mediastinal, hilar or axillary adenopathy. Remaining mediastinal structures are unremarkable.   Images through the upper abdomen demonstrate a few small hypodensities over the right lobe of the liver with the larger measuring 9 mm likely cysts or hemangiomas. There are mild degenerate changes of the spine.  IMPRESSION: Opacification over a short segment of the basilar left lower lobe bronchus which may represent mucous plugging versus aspiration. Associated consolidation of the basilar left lower lobe which may be due to atelectasis or infection. Mild posterior dependent right basilar atelectasis.  Few small hypodensities over the right lobe of the liver with the largest measuring 9 mm likely cysts or hemangiomas.  Mild atherosclerotic coronary artery disease.   Electronically Signed   By: Marin Olp M.D.   On: 11/25/2014 10:50    ASSESSMENT / PLAN:  LLL pneumonia was likely an CAP but agree treating for HCAP given hospital exposure  Acute Sinusitis  Mucous plugging w/ atelectasis +/- airway narrowing  H/o recurrent Sinusitis  H/o recurrent PNA  PAF H/o TIA   Discussion  Suspect that this was indeed an acute sinusitis that progressed to LLL PNA. He has a history of recurrent sinusitis and pneumonias dating back to his teen years. His clinical status declined after transition from IV to oral abx; raising concern for resistant organism.   Plan Cont empiric abx (will need at least 10-14 days total antibiotic therapy) Add nasal hygiene regimen: nasal saline-->afrin-->flonase Flutter valve following scheduled BD Mucomyst X 1 day  Repeat CT chest 4 weeks for "recurrent PNA"; we will see him in our office first  Agree he needs ENT consult (could be done out patient)    Erick Colace ACNP-BC [place] Pager # [phone] OR # [phone] if no answer   11/26/2014, 11:58 AM  STAFF NOTE: I, Merrie Roof, MD FACP have personally reviewed patient's available data, including medical history, events of note, physical examination and test results as part of my  evaluation. I have discussed with resident/NP and other care providers such as pharmacist, RN and RRT. In addition, I personally evaluated patient and elicited key findings of: ATX is unimpressive, syndrome thus far is c/w infection, addition mucocyst x 2-4 doses, pcxr follow up, ambulation, fluter valve, no role bronch at this time, NEEDS to follow up with pccm office pulmonary to ensure LLL atx opens up with abx course, if not improved on follow up study = Bronch assessment to ensure no endobronchial lesion, ABX choice thus far is reasonable as empiric given nosocomial exposure, narrow at 48 hrs to ceftriaxone--> Augmentin if progressing and culture negative, consider 10 days duration with concern sinusitis also, no role pred, reduce to off Will revisit Monday or if dc home as outpt needs to see pulmonary 1 week post dc [phone])  Lavon Paganini. Titus Mould, MD, [place] Pgr: [facility] 11/26/2014 5:13 PM

## 2014-11-26 NOTE — Progress Notes (Signed)
[facility] PROGRESS NOTE  Jose [name]. FIE:[id number] DOB: [birth year] DOA: 11/24/2014 PCP: Karlene Einstein, MD  Summary Jose [name]. is a pleasant 79 y.o. Male with essential hypertension/monoclonal gammopathy of unknown significance/recently diagnosed atrial fibrillation on Eliquis-still caring 30 day heart monitor which picked up the atrial fibrillation, who presented with cough and shortness of breath and was found to have HCAP(hospitalized less than 90 days ago for TIA here in [place] and in [place] earlier this month for "sinus infection"). At the time of admission his chest x-ray showed "New left posterior base opacity and volume loss. This could represent pneumonia or atelectasis. No effusion". White count was 11,000. He was therefore started on vancomycin/cefepime for healthcare associated pneumonia. He was also placed on steroids. The concern was for a postobstructive process as he stated that his oxygen went low at some point last month when he was managed for TIA, we therefore obtained CT chest with contrast which showed "Opacification over a short segment of the basilar left lower lobe bronchus which may represent mucous plugging versus aspiration. Associated consolidation of the basilar left lower lobe which may be due to atelectasis or infection. Mild posterior dependent right basilar atelectasis". Patient reports feeling better today but is still dyspneic. Will request chest PT/incentive spirometry/flutter valve and consult pulmonary. Dr. Titus Mould will graciously see patient in consult regarding mucous plugging query further workup/management. Plan 1. HCAP -cultures negative so far -Day 3 Cefepime and Vanc -Continue nebs and prednisone -Chest PT -Consult pulmonary  2. Hypertension -Continue home medications -Monitor creatinine -Discontinue IV fluids  3. TIA -under workup now-has heart monitor -will follow up as an  outpatient  4. Hyperlipidemia -will check lipid panel -currently not on statins  5. Atrial Fibrillation -on eloquis will continue -will continue with telemetry monitoring  6. Elevated Glucose -HbA1C 5.8 -will monitor FSBS  Code Status: Full Code  DVT Prophylaxis:Eliquis Family Communication: None Disposition Plan: Home   Consultants:  None  Procedures:  None  Antibiotics:  Vancomycin 11/24/2014>  Cefepime 11/24/2014>  HPI/Subjective: Still has some shortness of breath but is better.  Objective: Filed Vitals:   11/26/14 0550  BP: 134/76  Pulse: 53  Temp: 97.5 F (36.4 C)  Resp: 18    Intake/Output Summary (Last 24 hours) at 11/26/14 1001 Last data filed at 11/26/14 0947  Gross per 24 hour  Intake 2001.25 ml  Output    427 ml  Net 1574.25 ml   Filed Weights   11/24/14 1400 11/25/14 0332 11/26/14 0550  Weight: 103.874 kg (229 lb) 101.5 kg (223 lb 12.3 oz) 101.878 kg (224 lb 9.6 oz)    Exam:   General:  Mild respiratory distress  Cardiovascular: RRR. S1-S2 normal. No murmurs.  Respiratory: Scant wheezing.  Abdomen: Soft and nontender. Normal bowel sounds.  Musculoskeletal: No pedal edema.   Data Reviewed: Basic Metabolic Panel:  Recent Labs Lab 11/24/14 1410 11/25/14 0703 11/26/14 0607  NA 136 139 141  K 3.8 4.6 3.4*  CL 102 103 105  CO2 28 26 32  GLUCOSE 144* 156* 111*  BUN 26* 18 14  CREATININE 1.11 1.00 0.77  CALCIUM 8.9 8.3* 8.4   Liver Function Tests:  Recent Labs Lab 11/25/14 0703 11/26/14 0607  AST 41* 31  ALT 60* 56*  ALKPHOS 41 37*  BILITOT 0.8 0.9  PROT 6.3 6.0  ALBUMIN 3.6 3.4*   No results for input(s): LIPASE, AMYLASE in the last 168 hours. No results for input(s): AMMONIA in the last  168 hours. CBC:  Recent Labs Lab 11/24/14 1410 11/25/14 0703 11/26/14 0607  WBC 11.2* 10.1 10.1  HGB 13.7 13.0 12.8*  HCT 41.3 39.3 38.2*  MCV 95.6 94.9 96.2  PLT 285 272 247   Cardiac Enzymes:  Recent  Labs Lab 11/24/14 1410  TROPONINI <0.03   BNP (last 3 results) No results for input(s): PROBNP in the last 8760 hours. CBG:  Recent Labs Lab 11/25/14 0749 11/26/14 0801  GLUCAP 133* 96    Recent Results (from the past 240 hour(s))  Culture, blood (routine x 2)     Status: None (Preliminary result)   Collection Time: 11/24/14  3:20 PM  Result Value Ref Range Status   Specimen Description BLOOD RIGHT HAND  Final   Special Requests BOTTLES DRAWN AEROBIC AND ANAEROBIC 5CC EACH  Final   Culture   Final           BLOOD CULTURE RECEIVED NO GROWTH TO DATE CULTURE WILL BE HELD FOR 5 DAYS BEFORE ISSUING A FINAL NEGATIVE REPORT Performed at [facility]    Report Status PENDING  Incomplete  Culture, blood (routine x 2)     Status: None (Preliminary result)   Collection Time: 11/24/14  3:30 PM  Result Value Ref Range Status   Specimen Description BLOOD LEFT HAND  Final   Special Requests BOTTLES DRAWN AEROBIC AND ANAEROBIC 5CC EACH  Final   Culture   Final           BLOOD CULTURE RECEIVED NO GROWTH TO DATE CULTURE WILL BE HELD FOR 5 DAYS BEFORE ISSUING A FINAL NEGATIVE REPORT Performed at [facility]    Report Status PENDING  Incomplete  Culture, blood (routine x 2) Call MD if unable to obtain prior to antibiotics being given     Status: None (Preliminary result)   Collection Time: 11/24/14 10:07 PM  Result Value Ref Range Status   Specimen Description BLOOD LEFT HAND  Final   Special Requests BOTTLES DRAWN AEROBIC AND ANAEROBIC 10 CC  Final   Culture   Final           BLOOD CULTURE RECEIVED NO GROWTH TO DATE CULTURE WILL BE HELD FOR 5 DAYS BEFORE ISSUING A FINAL NEGATIVE REPORT Performed at [facility]    Report Status PENDING  Incomplete  Culture, blood (routine x 2) Call MD if unable to obtain prior to antibiotics being given     Status: None (Preliminary result)   Collection Time: 11/24/14 10:13 PM  Result Value Ref Range Status   Specimen  Description BLOOD RIGHT HAND  Final   Special Requests BOTTLES DRAWN AEROBIC AND ANAEROBIC 10 CC  Final   Culture   Final           BLOOD CULTURE RECEIVED NO GROWTH TO DATE CULTURE WILL BE HELD FOR 5 DAYS BEFORE ISSUING A FINAL NEGATIVE REPORT Performed at [facility]    Report Status PENDING  Incomplete     Studies: Dg Chest 2 View  11/24/2014   CLINICAL DATA:  Productive cough and dyspnea for 1 week.  EXAM: CHEST  2 VIEW  COMPARISON:  10/13/2014  FINDINGS: There is posterior left base opacity and mild left hemidiaphragm elevation, new. The right lung is clear. There are no effusions. Pulmonary vasculature is normal. Hilar, mediastinal and cardiac contours appear unremarkable and unchanged.  IMPRESSION: New left posterior base opacity and volume loss. This could represent pneumonia or atelectasis. No effusion.   Electronically Signed  By: Andreas Newport M.D.   On: 11/24/2014 14:44   Ct Chest W Contrast  11/25/2014   CLINICAL DATA:  Patient was treated for sinus infection while in [place] for the past month. Patient ran fevers while being treated for the sinus infection, he has been fever free for about 2 weeks. Patient now admitted for pneumonia. Patient denies any cough, chest pain, or shortness of breath.  EXAM: CT CHEST WITH CONTRAST  TECHNIQUE: Multidetector CT imaging of the chest was performed during intravenous contrast administration.  CONTRAST:  75mL OMNIPAQUE IOHEXOL 300 MG/ML  SOLN  COMPARISON:  Chest x-ray 11/24/2014  FINDINGS: Lungs are adequately inflated and demonstrate consolidation over the basilar left lower lobe which may be due to atelectasis or infection. There is complete opacification over a short segment of the basilar left lower lobe bronchus which may be due to mucous plugging or aspiration. There are a few tiny dense foci/calcifications adjacent this lower lobe bronchus. Mild opacification over the posterior right lower lobe likely atelectasis no pleural  effusion.  Heart is normal in size. There is mild calcified plaque over the left lateral circumflex coronary artery and minimally involving the left anterior descending and right coronary artery. Mild calcified plaque over the thoracic aorta. There is no mediastinal, hilar or axillary adenopathy. Remaining mediastinal structures are unremarkable.  Images through the upper abdomen demonstrate a few small hypodensities over the right lobe of the liver with the larger measuring 9 mm likely cysts or hemangiomas. There are mild degenerate changes of the spine.  IMPRESSION: Opacification over a short segment of the basilar left lower lobe bronchus which may represent mucous plugging versus aspiration. Associated consolidation of the basilar left lower lobe which may be due to atelectasis or infection. Mild posterior dependent right basilar atelectasis.  Few small hypodensities over the right lobe of the liver with the largest measuring 9 mm likely cysts or hemangiomas.  Mild atherosclerotic coronary artery disease.   Electronically Signed   By: Marin Olp M.D.   On: 11/25/2014 10:50    Scheduled Meds: . amLODipine  10 mg Oral Daily  . antiseptic oral rinse  7 mL Mouth Rinse BID  . apixaban  5 mg Oral BID  . ceFEPime (MAXIPIME) IV  1 g Intravenous 3 times per day  . folic acid  1 mg Oral Daily  . gabapentin  300 mg Oral BID  . ipratropium-albuterol  3 mL Nebulization Q6H  . labetalol  100 mg Oral BID  . multivitamin with minerals  1 tablet Oral Daily  . omega-3 acid ethyl esters  1 g Oral BID  . potassium chloride  40 mEq Oral Once  . predniSONE  40 mg Oral Q breakfast  . sodium chloride  3 mL Intravenous Q12H  . sodium chloride  3 mL Intravenous Q12H  . thiamine  100 mg Oral Daily  . vancomycin  750 mg Intravenous Q12H   Continuous Infusions:   Principal Problem:   HCAP (healthcare-associated pneumonia) Active Problems:   HYPERTENSION, BENIGN SYSTEMIC   TIA (transient ischemic attack)    Hyperlipidemia    Time spent: 15 minutes    Kylie Gros  [facility] Pager [phone]. If 7PM-7AM, please contact night-coverage at [email or web address], password [facility] 11/26/2014, 10:01 AM  LOS: 2 days

## 2014-11-26 NOTE — Progress Notes (Signed)
Pt able to perform flutter valve effectively. Pt coughing up small to moderate amts of thick white secretions. Pt encouraged to perform x10 every 4 hours while awake

## 2014-11-27 LAB — BASIC METABOLIC PANEL
Anion gap: 4 — ABNORMAL LOW (ref 5–15)
BUN: 16 mg/dL (ref 6–23)
CALCIUM: 8.3 mg/dL — AB (ref 8.4–10.5)
CO2: 33 mmol/L — ABNORMAL HIGH (ref 19–32)
Chloride: 100 mmol/L (ref 96–112)
Creatinine, Ser: 0.85 mg/dL (ref 0.50–1.35)
GFR calc Af Amer: >90 mL/min (ref >90)
GFR, EST NON AFRICAN AMERICAN: 82 mL/min — AB (ref >90)
Glucose, Bld: 117 mg/dL — ABNORMAL HIGH (ref 70–99)
Potassium: 3 mmol/L — ABNORMAL LOW (ref 3.5–5.1)
SODIUM: 137 mmol/L (ref 135–145)

## 2014-11-27 LAB — CBC
HCT: 37.1 % — ABNORMAL LOW (ref 39.0–52.0)
Hemoglobin: 12.4 g/dL — ABNORMAL LOW (ref 13.0–17.0)
MCH: 31.9 pg (ref 26.0–34.0)
MCHC: 33.4 g/dL (ref 30.0–36.0)
MCV: 95.4 fL (ref 78.0–100.0)
Platelets: 233 10*3/uL (ref 150–400)
RBC: 3.89 MIL/uL — ABNORMAL LOW (ref 4.22–5.81)
RDW: 13.8 % (ref 11.5–15.5)
WBC: 9.6 10*3/uL (ref 4.0–10.5)

## 2014-11-27 LAB — GLUCOSE, CAPILLARY: GLUCOSE-CAPILLARY: 91 mg/dL (ref 70–99)

## 2014-11-27 LAB — MAGNESIUM: Magnesium: 2.4 mg/dL (ref 1.5–2.5)

## 2014-11-27 MED ORDER — POTASSIUM CHLORIDE 20 MEQ/15ML (10%) PO SOLN
40.0000 meq | Freq: Once | ORAL | Status: AC
  Administered 2014-11-27: 40 meq via ORAL
  Filled 2014-11-27: qty 30

## 2014-11-27 NOTE — Progress Notes (Signed)
[facility] PROGRESS NOTE  Jose [name]. [id number]:[id number] DOB: 1936/09/11 DOA: 11/24/2014 PCP: Karlene Einstein, MD  Summary Appreciate pulmonary. Jose [name]. is a pleasant 79 y.o. Male with essential hypertension/monoclonal gammopathy of unknown significance/recently diagnosed atrial fibrillation on Eliquis-still caring 30 day heart monitor which picked up the atrial fibrillation, who presented with cough and shortness of breath and was found to have HCAP(hospitalized less than 90 days ago for TIA here in [place] and in [place] earlier this month for "sinus infection"). At the time of admission his chest x-ray showed "New left posterior base opacity and volume loss. This could represent pneumonia or atelectasis. No effusion". White count was 11,000. He was therefore started on vancomycin/cefepime for healthcare associated pneumonia. He was also placed on steroids. The concern was for a postobstructive process as he stated that his oxygen went low at some point last month when he was managed for TIA, we therefore obtained CT chest with contrast which showed "Opacification over a short segment of the basilar left lower lobe bronchus which may represent mucous plugging versus aspiration. Associated consolidation of the basilar left lower lobe which may be due to atelectasis or infection. Mild posterior dependent right basilar atelectasis". Patient reports improvement but he is worried about getting readmitted. We'll de-escalate antibiotics-discontinue phone, is seen today and switch to Augmentin tomorrow. If he continues to do well, he will likely DC home tomorrow. Plan 1. HCAP -cultures negative so far -Day 4 Cefepime and Vanc. Discontinue vancomycin -Continue nebs and prednisone -Chest PT -Follow pulmonary recommendations  2. Hypertension -Continue home medications -Monitor creatinine  3. TIA -under workup now-has heart monitor -will follow up as an  outpatient  4. Hyperlipidemia -will check lipid panel -currently not on statins  5. Atrial Fibrillation -on eloquis will continue -will continue with telemetry monitoring  6. Elevated Glucose -HbA1C 5.8 -will monitor FSBS  Code Status: Full Code  DVT Prophylaxis:Eliquis Family Communication: None Disposition Plan: Home tomorrow?   Consultants:  Pulmonary  Procedures:  None  Antibiotics:  Vancomycin 11/24/2014> 11/27/2014  Cefepime 11/24/2014>  HPI/Subjective: Feels better. Less short of breath.  Objective: Filed Vitals:   11/27/14 0534  BP: 148/73  Pulse: 50  Temp: 97.9 F (36.6 C)  Resp: 12    Intake/Output Summary (Last 24 hours) at 11/27/14 1005 Last data filed at 11/27/14 0955  Gross per 24 hour  Intake   1180 ml  Output      0 ml  Net   1180 ml   Filed Weights   11/24/14 1400 11/25/14 0332 11/26/14 0550  Weight: 103.874 kg (229 lb) 101.5 kg (223 lb 12.3 oz) 101.878 kg (224 lb 9.6 oz)    Exam:   General:  Sitting up eating. Not in distress.  Cardiovascular: RRR. S1-S2 normal. No murmurs.  Respiratory: Good air entry bilaterally. No wheezing.  Abdomen: Soft and nontender. Normal bowel sounds.  Musculoskeletal: No pedal edema.   Data Reviewed: Basic Metabolic Panel:  Recent Labs Lab 11/24/14 1410 11/25/14 0703 11/26/14 0607 11/27/14 0540  NA 136 139 141 137  K 3.8 4.6 3.4* 3.0*  CL 102 103 105 100  CO2 28 26 32 33*  GLUCOSE 144* 156* 111* 117*  BUN 26* 18 14 16   CREATININE 1.11 1.00 0.77 0.85  CALCIUM 8.9 8.3* 8.4 8.3*   Liver Function Tests:  Recent Labs Lab 11/25/14 0703 11/26/14 0607  AST 41* 31  ALT 60* 56*  ALKPHOS 41 37*  BILITOT 0.8 0.9  PROT 6.3  6.0  ALBUMIN 3.6 3.4*   No results for input(s): LIPASE, AMYLASE in the last 168 hours. No results for input(s): AMMONIA in the last 168 hours. CBC:  Recent Labs Lab 11/24/14 1410 11/25/14 0703 11/26/14 0607 11/27/14 0540  WBC 11.2* 10.1 10.1 9.6   HGB 13.7 13.0 12.8* 12.4*  HCT 41.3 39.3 38.2* 37.1*  MCV 95.6 94.9 96.2 95.4  PLT 285 272 247 233   Cardiac Enzymes:  Recent Labs Lab 11/24/14 1410  TROPONINI <0.03   BNP (last 3 results) No results for input(s): PROBNP in the last 8760 hours. CBG:  Recent Labs Lab 11/25/14 0749 11/26/14 0801 11/27/14 0752  GLUCAP 133* 96 91    Recent Results (from the past 240 hour(s))  Culture, blood (routine x 2)     Status: None (Preliminary result)   Collection Time: 11/24/14  3:20 PM  Result Value Ref Range Status   Specimen Description BLOOD RIGHT HAND  Final   Special Requests BOTTLES DRAWN AEROBIC AND ANAEROBIC 5CC EACH  Final   Culture   Final           BLOOD CULTURE RECEIVED NO GROWTH TO DATE CULTURE WILL BE HELD FOR 5 DAYS BEFORE ISSUING A FINAL NEGATIVE REPORT Performed at [facility]    Report Status PENDING  Incomplete  Culture, blood (routine x 2)     Status: None (Preliminary result)   Collection Time: 11/24/14  3:30 PM  Result Value Ref Range Status   Specimen Description BLOOD LEFT HAND  Final   Special Requests BOTTLES DRAWN AEROBIC AND ANAEROBIC 5CC EACH  Final   Culture   Final           BLOOD CULTURE RECEIVED NO GROWTH TO DATE CULTURE WILL BE HELD FOR 5 DAYS BEFORE ISSUING A FINAL NEGATIVE REPORT Performed at [facility]    Report Status PENDING  Incomplete  Culture, blood (routine x 2) Call MD if unable to obtain prior to antibiotics being given     Status: None (Preliminary result)   Collection Time: 11/24/14 10:07 PM  Result Value Ref Range Status   Specimen Description BLOOD LEFT HAND  Final   Special Requests BOTTLES DRAWN AEROBIC AND ANAEROBIC 10 CC  Final   Culture   Final           BLOOD CULTURE RECEIVED NO GROWTH TO DATE CULTURE WILL BE HELD FOR 5 DAYS BEFORE ISSUING A FINAL NEGATIVE REPORT Performed at [facility]    Report Status PENDING  Incomplete  Culture, blood (routine x 2) Call MD if unable to obtain prior to  antibiotics being given     Status: None (Preliminary result)   Collection Time: 11/24/14 10:13 PM  Result Value Ref Range Status   Specimen Description BLOOD RIGHT HAND  Final   Special Requests BOTTLES DRAWN AEROBIC AND ANAEROBIC 10 CC  Final   Culture   Final           BLOOD CULTURE RECEIVED NO GROWTH TO DATE CULTURE WILL BE HELD FOR 5 DAYS BEFORE ISSUING A FINAL NEGATIVE REPORT Performed at [facility]    Report Status PENDING  Incomplete     Studies: Ct Chest W Contrast  11/25/2014   CLINICAL DATA:  Patient was treated for sinus infection while in [place] for the past month. Patient ran fevers while being treated for the sinus infection, he has been fever free for about 2 weeks. Patient now admitted for pneumonia. Patient denies any  cough, chest pain, or shortness of breath.  EXAM: CT CHEST WITH CONTRAST  TECHNIQUE: Multidetector CT imaging of the chest was performed during intravenous contrast administration.  CONTRAST:  50mL OMNIPAQUE IOHEXOL 300 MG/ML  SOLN  COMPARISON:  Chest x-ray 11/24/2014  FINDINGS: Lungs are adequately inflated and demonstrate consolidation over the basilar left lower lobe which may be due to atelectasis or infection. There is complete opacification over a short segment of the basilar left lower lobe bronchus which may be due to mucous plugging or aspiration. There are a few tiny dense foci/calcifications adjacent this lower lobe bronchus. Mild opacification over the posterior right lower lobe likely atelectasis no pleural effusion.  Heart is normal in size. There is mild calcified plaque over the left lateral circumflex coronary artery and minimally involving the left anterior descending and right coronary artery. Mild calcified plaque over the thoracic aorta. There is no mediastinal, hilar or axillary adenopathy. Remaining mediastinal structures are unremarkable.  Images through the upper abdomen demonstrate a few small hypodensities over the right lobe of the  liver with the larger measuring 9 mm likely cysts or hemangiomas. There are mild degenerate changes of the spine.  IMPRESSION: Opacification over a short segment of the basilar left lower lobe bronchus which may represent mucous plugging versus aspiration. Associated consolidation of the basilar left lower lobe which may be due to atelectasis or infection. Mild posterior dependent right basilar atelectasis.  Few small hypodensities over the right lobe of the liver with the largest measuring 9 mm likely cysts or hemangiomas.  Mild atherosclerotic coronary artery disease.   Electronically Signed   By: Marin Olp M.D.   On: 11/25/2014 10:50    Scheduled Meds: . acetylcysteine  3 mL Nebulization Q12H  . albuterol  2.5 mg Nebulization QID  . amLODipine  10 mg Oral Daily  . antiseptic oral rinse  7 mL Mouth Rinse BID  . apixaban  5 mg Oral BID  . ceFEPime (MAXIPIME) IV  1 g Intravenous 3 times per day  . sodium chloride  2 spray Each Nare TID PC & HS   Followed by  . [START ON 11/30/2014] oxymetazoline  1 spray Each Nare BID   Followed by  . [START ON 12/04/2014] fluticasone  2 spray Each Nare BID  . folic acid  1 mg Oral Daily  . gabapentin  300 mg Oral BID  . labetalol  100 mg Oral BID  . multivitamin with minerals  1 tablet Oral Daily  . omega-3 acid ethyl esters  1 g Oral BID  . predniSONE  40 mg Oral Q breakfast  . sodium chloride  3 mL Intravenous Q12H  . sodium chloride  3 mL Intravenous Q12H  . thiamine  100 mg Oral Daily   Continuous Infusions:   Principal Problem:   HCAP (healthcare-associated pneumonia) Active Problems:   HYPERTENSION, BENIGN SYSTEMIC   TIA (transient ischemic attack)   Hyperlipidemia   SOB (shortness of breath)    Time spent: 15 minutes    Jaline Pincock  [facility] Pager [phone]. If 7PM-7AM, please contact night-coverage at [email or web address], password [email or web address] 11/27/2014, 10:05 AM  LOS: 3 days

## 2014-11-28 LAB — GLUCOSE, CAPILLARY: Glucose-Capillary: 92 mg/dL (ref 70–99)

## 2014-11-28 LAB — EXPECTORATED SPUTUM ASSESSMENT W GRAM STAIN, RFLX TO RESP C

## 2014-11-28 LAB — EXPECTORATED SPUTUM ASSESSMENT W REFEX TO RESP CULTURE

## 2014-11-28 MED ORDER — SACCHAROMYCES BOULARDII 250 MG PO CAPS
250.0000 mg | ORAL_CAPSULE | Freq: Two times a day (BID) | ORAL | Status: DC
  Administered 2014-11-28 – 2014-11-29 (×3): 250 mg via ORAL
  Filled 2014-11-28 (×4): qty 1

## 2014-11-28 MED ORDER — GUAIFENESIN ER 600 MG PO TB12
1200.0000 mg | ORAL_TABLET | Freq: Two times a day (BID) | ORAL | Status: DC
  Administered 2014-11-28 – 2014-11-29 (×3): 1200 mg via ORAL
  Filled 2014-11-28 (×4): qty 2

## 2014-11-28 MED ORDER — LEVOFLOXACIN 750 MG PO TABS
750.0000 mg | ORAL_TABLET | Freq: Every day | ORAL | Status: DC
  Administered 2014-11-28 – 2014-11-29 (×2): 750 mg via ORAL
  Filled 2014-11-28 (×2): qty 1

## 2014-11-28 MED ORDER — ALBUTEROL SULFATE (2.5 MG/3ML) 0.083% IN NEBU
2.5000 mg | INHALATION_SOLUTION | Freq: Two times a day (BID) | RESPIRATORY_TRACT | Status: DC
  Administered 2014-11-28 – 2014-11-29 (×2): 2.5 mg via RESPIRATORY_TRACT
  Filled 2014-11-28 (×2): qty 3

## 2014-11-28 NOTE — Progress Notes (Signed)
[facility] PROGRESS NOTE  Jose [name]. [id number] DOB: [birth year] DOA: 11/24/2014 PCP: Karlene Einstein, MD  Summary Appreciate pulmonary. Jose [name]. is a pleasant 79 y.o. Male with essential hypertension/monoclonal gammopathy of unknown significance/recently diagnosed atrial fibrillation on Eliquis-still caring 30 day heart monitor which picked up the atrial fibrillation, who presented with cough and shortness of breath and was found to have HCAP(hospitalized less than 90 days ago for TIA here in [place] and in [place] earlier this month for "sinus infection"). At the time of admission his chest x-ray showed "New left posterior base opacity and volume loss. This could represent pneumonia or atelectasis. No effusion". White count was 11,000. He was therefore started on vancomycin/cefepime for healthcare associated pneumonia. He was also placed on steroids. The concern was for a postobstructive process as he stated that his oxygen went low at some point last month when he was managed for TIA, we therefore obtained CT chest with contrast which showed "Opacification over a short segment of the basilar left lower lobe bronchus which may represent mucous plugging versus aspiration. Associated consolidation of the basilar left lower lobe which may be due to atelectasis or infection. Mild posterior dependent right basilar atelectasis". Patient has been coughing up stuff since last night. Will send the sputum for culture. Given the slow response, will add Levaquin by mouth and he will probably DC on it. Plan 1. HCAP -Obtain sputum culture -Day 5 Cefepime. Levaquin -Continue nebs and prednisone -Chest PT -Follow pulmonary recommendations  2. Hypertension -Continue home medications -Monitor creatinine  3. TIA -under workup now-has heart monitor -will follow up as an outpatient  4. Hyperlipidemia -will check lipid panel -currently not on statins  5. Atrial  Fibrillation -on eloquis will continue -will continue with telemetry monitoring  6. Elevated Glucose -HbA1C 5.8 -will monitor FSBS  Code Status: Full Code  DVT Prophylaxis:Eliquis Family Communication: None Disposition Plan: Home tomorrow?   Consultants:  Pulmonary  Procedures:  None  Antibiotics:  Vancomycin 11/24/2014> 11/27/2014  Cefepime 11/24/2014>  Levaquin 11/28/2014>  HPI/Subjective: Complains of productive cough  Objective: Filed Vitals:   11/28/14 0540  BP: 141/89  Pulse: 79  Temp: 98 F (36.7 C)  Resp: 15    Intake/Output Summary (Last 24 hours) at 11/28/14 0920 Last data filed at 11/27/14 1500  Gross per 24 hour  Intake    410 ml  Output      0 ml  Net    410 ml   Filed Weights   11/24/14 1400 11/25/14 0332 11/26/14 0550  Weight: 103.874 kg (229 lb) 101.5 kg (223 lb 12.3 oz) 101.878 kg (224 lb 9.6 oz)    Exam:   General:  Sitting up coughing.  Cardiovascular: S1-S2 normal no murmurs.  Respiratory: He is a well-developed me  Abdomen: Soft, non tender. +BS.  Musculoskeletal: No pedal edema.   Data Reviewed: Basic Metabolic Panel:  Recent Labs Lab 11/24/14 1410 11/25/14 0703 11/26/14 0607 11/27/14 0540  NA 136 139 141 137  K 3.8 4.6 3.4* 3.0*  CL 102 103 105 100  CO2 28 26 32 33*  GLUCOSE 144* 156* 111* 117*  BUN 26* 18 14 16   CREATININE 1.11 1.00 0.77 0.85  CALCIUM 8.9 8.3* 8.4 8.3*  MG  --   --   --  2.4   Liver Function Tests:  Recent Labs Lab 11/25/14 0703 11/26/14 0607  AST 41* 31  ALT 60* 56*  ALKPHOS 41 37*  BILITOT 0.8 0.9  PROT  6.3 6.0  ALBUMIN 3.6 3.4*   No results for input(s): LIPASE, AMYLASE in the last 168 hours. No results for input(s): AMMONIA in the last 168 hours. CBC:  Recent Labs Lab 11/24/14 1410 11/25/14 0703 11/26/14 0607 11/27/14 0540  WBC 11.2* 10.1 10.1 9.6  HGB 13.7 13.0 12.8* 12.4*  HCT 41.3 39.3 38.2* 37.1*  MCV 95.6 94.9 96.2 95.4  PLT 285 272 247 233   Cardiac  Enzymes:  Recent Labs Lab 11/24/14 1410  TROPONINI <0.03   BNP (last 3 results) No results for input(s): PROBNP in the last 8760 hours. CBG:  Recent Labs Lab 11/25/14 0749 11/26/14 0801 11/27/14 0752 11/28/14 0739  GLUCAP 133* 96 91 92    Recent Results (from the past 240 hour(s))  Culture, blood (routine x 2)     Status: None (Preliminary result)   Collection Time: 11/24/14  3:20 PM  Result Value Ref Range Status   Specimen Description BLOOD RIGHT HAND  Final   Special Requests BOTTLES DRAWN AEROBIC AND ANAEROBIC 5CC EACH  Final   Culture   Final           BLOOD CULTURE RECEIVED NO GROWTH TO DATE CULTURE WILL BE HELD FOR 5 DAYS BEFORE ISSUING A FINAL NEGATIVE REPORT Performed at [facility]    Report Status PENDING  Incomplete  Culture, blood (routine x 2)     Status: None (Preliminary result)   Collection Time: 11/24/14  3:30 PM  Result Value Ref Range Status   Specimen Description BLOOD LEFT HAND  Final   Special Requests BOTTLES DRAWN AEROBIC AND ANAEROBIC 5CC EACH  Final   Culture   Final           BLOOD CULTURE RECEIVED NO GROWTH TO DATE CULTURE WILL BE HELD FOR 5 DAYS BEFORE ISSUING A FINAL NEGATIVE REPORT Performed at [facility]    Report Status PENDING  Incomplete  Culture, blood (routine x 2) Call MD if unable to obtain prior to antibiotics being given     Status: None (Preliminary result)   Collection Time: 11/24/14 10:07 PM  Result Value Ref Range Status   Specimen Description BLOOD LEFT HAND  Final   Special Requests BOTTLES DRAWN AEROBIC AND ANAEROBIC 10 CC  Final   Culture   Final           BLOOD CULTURE RECEIVED NO GROWTH TO DATE CULTURE WILL BE HELD FOR 5 DAYS BEFORE ISSUING A FINAL NEGATIVE REPORT Performed at [facility]    Report Status PENDING  Incomplete  Culture, blood (routine x 2) Call MD if unable to obtain prior to antibiotics being given     Status: None (Preliminary result)   Collection Time: 11/24/14 10:13  PM  Result Value Ref Range Status   Specimen Description BLOOD RIGHT HAND  Final   Special Requests BOTTLES DRAWN AEROBIC AND ANAEROBIC 10 CC  Final   Culture   Final           BLOOD CULTURE RECEIVED NO GROWTH TO DATE CULTURE WILL BE HELD FOR 5 DAYS BEFORE ISSUING A FINAL NEGATIVE REPORT Performed at [facility]    Report Status PENDING  Incomplete     Studies: No results found.  Scheduled Meds: . albuterol  2.5 mg Nebulization QID  . amLODipine  10 mg Oral Daily  . antiseptic oral rinse  7 mL Mouth Rinse BID  . apixaban  5 mg Oral BID  . ceFEPime (MAXIPIME) IV  1  g Intravenous 3 times per day  . sodium chloride  2 spray Each Nare TID PC & HS   Followed by  . [START ON 11/30/2014] oxymetazoline  1 spray Each Nare BID   Followed by  . [START ON 12/04/2014] fluticasone  2 spray Each Nare BID  . folic acid  1 mg Oral Daily  . gabapentin  300 mg Oral BID  . guaiFENesin  1,200 mg Oral BID  . labetalol  100 mg Oral BID  . levofloxacin  750 mg Oral Daily  . multivitamin with minerals  1 tablet Oral Daily  . omega-3 acid ethyl esters  1 g Oral BID  . predniSONE  40 mg Oral Q breakfast  . saccharomyces boulardii  250 mg Oral BID  . sodium chloride  3 mL Intravenous Q12H  . sodium chloride  3 mL Intravenous Q12H  . thiamine  100 mg Oral Daily   Continuous Infusions:   Principal Problem:   HCAP (healthcare-associated pneumonia) Active Problems:   HYPERTENSION, BENIGN SYSTEMIC   TIA (transient ischemic attack)   Hyperlipidemia   SOB (shortness of breath)    Time spent: 15 minutes.    Samamtha Tiegs  [facility] Pager [phone]. If 7PM-7AM, please contact night-coverage at [email or web address], password [facility] 11/28/2014, 9:20 AM  LOS: 4 days

## 2014-11-29 LAB — COMPREHENSIVE METABOLIC PANEL
ALT: 63 U/L — AB (ref 0–53)
AST: 33 U/L (ref 0–37)
Albumin: 3.1 g/dL — ABNORMAL LOW (ref 3.5–5.2)
Alkaline Phosphatase: 38 U/L — ABNORMAL LOW (ref 39–117)
Anion gap: 7 (ref 5–15)
BUN: 19 mg/dL (ref 6–23)
CO2: 28 mmol/L (ref 19–32)
Calcium: 8.5 mg/dL (ref 8.4–10.5)
Chloride: 103 mmol/L (ref 96–112)
Creatinine, Ser: 0.83 mg/dL (ref 0.50–1.35)
GFR calc non Af Amer: 83 mL/min — ABNORMAL LOW (ref >90)
Glucose, Bld: 102 mg/dL — ABNORMAL HIGH (ref 70–99)
POTASSIUM: 3.5 mmol/L (ref 3.5–5.1)
SODIUM: 138 mmol/L (ref 135–145)
Total Bilirubin: 0.8 mg/dL (ref 0.3–1.2)
Total Protein: 5.6 g/dL — ABNORMAL LOW (ref 6.0–8.3)

## 2014-11-29 LAB — CBC
HEMATOCRIT: 37.8 % — AB (ref 39.0–52.0)
HEMOGLOBIN: 12.5 g/dL — AB (ref 13.0–17.0)
MCH: 31.3 pg (ref 26.0–34.0)
MCHC: 33.1 g/dL (ref 30.0–36.0)
MCV: 94.7 fL (ref 78.0–100.0)
PLATELETS: 202 10*3/uL (ref 150–400)
RBC: 3.99 MIL/uL — AB (ref 4.22–5.81)
RDW: 13.2 % (ref 11.5–15.5)
WBC: 10.4 10*3/uL (ref 4.0–10.5)

## 2014-11-29 LAB — GLUCOSE, CAPILLARY: GLUCOSE-CAPILLARY: 89 mg/dL (ref 70–99)

## 2014-11-29 MED ORDER — GUAIFENESIN ER 600 MG PO TB12: 1200.0000 mg | ORAL_TABLET | Freq: Two times a day (BID) | ORAL | Status: DC

## 2014-11-29 MED ORDER — ALBUTEROL SULFATE HFA 108 (90 BASE) MCG/ACT IN AERS: 2.0000 | INHALATION_SPRAY | Freq: Four times a day (QID) | RESPIRATORY_TRACT | Status: DC | PRN

## 2014-11-29 MED ORDER — ALBUTEROL SULFATE (2.5 MG/3ML) 0.083% IN NEBU: 2.5000 mg | INHALATION_SOLUTION | RESPIRATORY_TRACT | Status: DC | PRN

## 2014-11-29 NOTE — Discharge Summary (Signed)
Jose Hoffmann., is a 79 y.o. male  DOB Nov 24, 1935  MRN [id number].  Admission date:  11/24/2014  Admitting Physician  Eugenie Filler, MD  Discharge Date:  11/29/2014   Primary MD  Karlene Einstein, MD  Recommendations for primary care physician for things to follow:   Follow pulmonary.  Admission Diagnosis  Hypoxia [R09.02] HCAP (healthcare-associated pneumonia) [J18.9]   Discharge Diagnosis  Hypoxia [R09.02] HCAP (healthcare-associated pneumonia) [J18.9]   Active Problems:   HYPERTENSION, BENIGN SYSTEMIC   Hyperlipidemia      Past Medical History  Diagnosis Date  . History of CT scan of head 1997    small vessel disease  . Gallstones 07/1996  . History of ETT 05/2002     no EKG changes  . History of MRI of cervical spine 07/15/2002  . Rectal bleeding 12/2002    colonoscopy  . Hypertension   . MGUS (monoclonal gammopathy of unknown significance) 04/11/2012  . Skin cancer   . Seizures     last seizure 1976  . TIA (transient ischemic attack)   . Peripheral neuropathy   . Pneumonia 10/2014    Past Surgical History  Procedure Laterality Date  . Skin cancer excision  07/1993    left lateral arm  . Keratosis excision  05/1998    left hand  . Ulnar nerve transfer  12/1999    left  . Colonoscopy w/ polypectomy  04/19/2003    tubular adenoma  . Shoulder injection  June 2012    left, [place]  . Thumb injection  June 2012    left, [place]  . Great toe arthrodesis, interphalangeal joint  1978  . Tonsillectomy    . Adenoidectomy    . Cataract extraction Bilateral        History of present illness and  Hospital Course:     Kindly see H&P for history of present illness and admission details, please review complete Labs, Consult reports and Test reports for all details in brief  HPI  from the history and physical  done on the day of admission    [name] is a pleasant 79 y.o. Male with essential hypertension/monoclonal gammopathy of unknown significance/recently diagnosed atrial fibrillation on Eliquis-still caring 30 day heart monitor which picked up the atrial fibrillation, who presented with cough and shortness of breath and was found to have HCAP(hospitalized less than 90 days ago for TIA here in [place] and in [place] earlier this month for "sinus infection"). At the time of admission his chest x-ray showed "New left posterior base opacity and volume loss. This could represent pneumonia or atelectasis. No effusion". White count was 11,000. He was therefore started on vancomycin/cefepime for healthcare associated pneumonia. He was also placed on steroids. The concern was for a postobstructive process as he stated that his oxygen went low at some point last month when he was managed for TIA, we therefore obtained CT chest with contrast which showed "Opacification over a short segment of the basilar left  lower lobe bronchus which may represent mucous plugging versus aspiration. Associated consolidation of the basilar left lower lobe which may be due to atelectasis or infection. Mild posterior dependent right basilar atelectasis". Patient was seen by pulmonary inhouse with recommendations to continue antibiotics, flutter valve and outpatient follow up as scheduled. Patient reports that cough is better. Repeat sputum culture results pending. He still has a few more days of Prednisone/Moxifloxacin at home and I will just let him finish this course.  Also added Albuterol inhaler as needed. He is discharged in stable condition.   Discharge Condition: Stable.   Follow UP  Follow-up Information    Follow up with Simonne Maffucci, MD On 01/03/2015.   Specialty:  Pulmonary Disease   Why:  230 pm    Contact information:   [place] [id number] [phone]          Discharge Instructions  and  Discharge Medications    Discharge Instructions    Diet - low sodium heart healthy    Complete by:  As directed      Increase activity slowly    Complete by:  As directed             Medication List    STOP taking these medications        FISH OIL CONCENTRATE 1000 MG Caps      TAKE these medications        albuterol 108 (90 BASE) MCG/ACT inhaler  Commonly known as:  PROVENTIL HFA;VENTOLIN HFA  Inhale 2 puffs into the lungs every 6 (six) hours as needed for wheezing or shortness of breath.     amLODipine 5 MG tablet  Commonly known as:  NORVASC  Take 5 mg by mouth daily.     apixaban 5 MG Tabs tablet  Commonly known as:  ELIQUIS  Take 1 tablet (5 mg total) by mouth 2 (two) times daily.     CENTRUM SILVER PO  Take 1 tablet by mouth daily.     fluticasone 50 MCG/ACT nasal spray  Commonly known as:  FLONASE  Place 1 spray into both nostrils daily.     Fluticasone-Salmeterol 250-50 MCG/DOSE Aepb  Commonly known as:  ADVAIR  Inhale 1 puff into the lungs 2 (two) times daily.     gabapentin 300 MG capsule  Commonly known as:  NEURONTIN  Take 300 mg by mouth 2 (two) times daily.     guaiFENesin 600 MG 12 hr tablet  Commonly known as:  MUCINEX  Take 2 tablets (1,200 mg total) by mouth 2 (two) times daily.     labetalol 200 MG tablet  Commonly known as:  NORMODYNE  Take 100 mg by mouth 2 (two) times daily.     losartan-hydrochlorothiazide 50-12.5 MG per tablet  Commonly known as:  HYZAAR  Take 1 tablet by mouth 2 (two) times daily.     moxifloxacin 400 MG tablet  Commonly known as:  AVELOX  Take 400 mg by mouth daily at 8 pm. On day 3     predniSONE 5 MG tablet  Commonly known as:  DELTASONE  Take 30 mg by mouth daily with breakfast. For the next 3 days. Then on Saturday patient tapers down to 10 mg for 3 days     RED YEAST RICE PO  Take 2 tablets by mouth daily.          Diet and Activity recommendation: See  Discharge Instructions above   Consults obtained - Pulmonary  Major procedures and Radiology Reports - PLEASE review detailed and final reports for all details, in brief -    Dg Chest 2 View  11/24/2014   CLINICAL DATA:  Productive cough and dyspnea for 1 week.  EXAM: CHEST  2 VIEW  COMPARISON:  10/13/2014  FINDINGS: There is posterior left base opacity and mild left hemidiaphragm elevation, new. The right lung is clear. There are no effusions. Pulmonary vasculature is normal. Hilar, mediastinal and cardiac contours appear unremarkable and unchanged.  IMPRESSION: New left posterior base opacity and volume loss. This could represent pneumonia or atelectasis. No effusion.   Electronically Signed   By: Andreas Newport M.D.   On: 11/24/2014 14:44   Ct Chest W Contrast  11/25/2014   CLINICAL DATA:  Patient was treated for sinus infection while in [place] for the past month. Patient ran fevers while being treated for the sinus infection, he has been fever free for about 2 weeks. Patient now admitted for pneumonia. Patient denies any cough, chest pain, or shortness of breath.  EXAM: CT CHEST WITH CONTRAST  TECHNIQUE: Multidetector CT imaging of the chest was performed during intravenous contrast administration.  CONTRAST:  49mL OMNIPAQUE IOHEXOL 300 MG/ML  SOLN  COMPARISON:  Chest x-ray 11/24/2014  FINDINGS: Lungs are adequately inflated and demonstrate consolidation over the basilar left lower lobe which may be due to atelectasis or infection. There is complete opacification over a short segment of the basilar left lower lobe bronchus which may be due to mucous plugging or aspiration. There are a few tiny dense foci/calcifications adjacent this lower lobe bronchus. Mild opacification over the posterior right lower lobe likely atelectasis no pleural effusion.  Heart is normal in size. There is mild calcified plaque over the left lateral circumflex coronary artery and minimally involving the left anterior  descending and right coronary artery. Mild calcified plaque over the thoracic aorta. There is no mediastinal, hilar or axillary adenopathy. Remaining mediastinal structures are unremarkable.  Images through the upper abdomen demonstrate a few small hypodensities over the right lobe of the liver with the larger measuring 9 mm likely cysts or hemangiomas. There are mild degenerate changes of the spine.  IMPRESSION: Opacification over a short segment of the basilar left lower lobe bronchus which may represent mucous plugging versus aspiration. Associated consolidation of the basilar left lower lobe which may be due to atelectasis or infection. Mild posterior dependent right basilar atelectasis.  Few small hypodensities over the right lobe of the liver with the largest measuring 9 mm likely cysts or hemangiomas.  Mild atherosclerotic coronary artery disease.   Electronically Signed   By: Marin Olp M.D.   On: 11/25/2014 10:50    Micro Results    Recent Results (from the past 240 hour(s))  Culture, blood (routine x 2)     Status: None (Preliminary result)   Collection Time: 11/24/14  3:20 PM  Result Value Ref Range Status   Specimen Description BLOOD RIGHT HAND  Final   Special Requests BOTTLES DRAWN AEROBIC AND ANAEROBIC 5CC EACH  Final   Culture   Final           BLOOD CULTURE RECEIVED NO GROWTH TO DATE CULTURE WILL BE HELD FOR 5 DAYS BEFORE ISSUING A FINAL NEGATIVE REPORT Performed at [facility]    Report Status PENDING  Incomplete  Culture, blood (routine x 2)     Status: None (Preliminary result)   Collection Time: 11/24/14  3:30 PM  Result Value Ref Range  Status   Specimen Description BLOOD LEFT HAND  Final   Special Requests BOTTLES DRAWN AEROBIC AND ANAEROBIC 5CC EACH  Final   Culture   Final           BLOOD CULTURE RECEIVED NO GROWTH TO DATE CULTURE WILL BE HELD FOR 5 DAYS BEFORE ISSUING A FINAL NEGATIVE REPORT Performed at [facility]    Report Status PENDING   Incomplete  Culture, blood (routine x 2) Call MD if unable to obtain prior to antibiotics being given     Status: None (Preliminary result)   Collection Time: 11/24/14 10:07 PM  Result Value Ref Range Status   Specimen Description BLOOD LEFT HAND  Final   Special Requests BOTTLES DRAWN AEROBIC AND ANAEROBIC 10 CC  Final   Culture   Final           BLOOD CULTURE RECEIVED NO GROWTH TO DATE CULTURE WILL BE HELD FOR 5 DAYS BEFORE ISSUING A FINAL NEGATIVE REPORT Performed at [facility]    Report Status PENDING  Incomplete  Culture, blood (routine x 2) Call MD if unable to obtain prior to antibiotics being given     Status: None (Preliminary result)   Collection Time: 11/24/14 10:13 PM  Result Value Ref Range Status   Specimen Description BLOOD RIGHT HAND  Final   Special Requests BOTTLES DRAWN AEROBIC AND ANAEROBIC 10 CC  Final   Culture   Final           BLOOD CULTURE RECEIVED NO GROWTH TO DATE CULTURE WILL BE HELD FOR 5 DAYS BEFORE ISSUING A FINAL NEGATIVE REPORT Performed at [facility]    Report Status PENDING  Incomplete  Culture, expectorated sputum-assessment     Status: None   Collection Time: 11/28/14  1:09 PM  Result Value Ref Range Status   Specimen Description SPUTUM  Final   Special Requests NONE  Final   Sputum evaluation   Final    MICROSCOPIC FINDINGS SUGGEST THAT THIS SPECIMEN IS NOT REPRESENTATIVE OF LOWER RESPIRATORY SECRETIONS. PLEASE RECOLLECT. CALLED TO BENNETT,K RN 11/28/14 [place]    Report Status 11/28/2014 FINAL  Final  Culture, expectorated sputum-assessment     Status: None   Collection Time: 11/28/14  5:15 PM  Result Value Ref Range Status   Specimen Description SPUTUM  Final   Special Requests NONE  Final   Sputum evaluation   Final    THIS SPECIMEN IS ACCEPTABLE. RESPIRATORY CULTURE REPORT TO FOLLOW.   Report Status 11/28/2014 FINAL  Final       Today   Subjective:   Jose [name] today has no headache,no chest  abdominal pain,no new weakness tingling or numbness, feels much better wants to go home today.  Objective:   Blood pressure 119/72, pulse 54, temperature 98.1 F (36.7 C), temperature source Oral, resp. rate 18, height 6' (1.829 m), weight 101.878 kg (224 lb 9.6 oz), SpO2 96 %.   Intake/Output Summary (Last 24 hours) at 11/29/14 1011 Last data filed at 11/28/14 2143  Gross per 24 hour  Intake 543.17 ml  Output      0 ml  Net 543.17 ml    Exam Awake Alert, Oriented x 3, No new F.N deficits, Normal affect [place].AT,PERRAL Supple Neck,No JVD, No cervical lymphadenopathy appriciated.  Symmetrical Chest wall movement, Good air movement bilaterally, CTAB RRR,No Gallops,Rubs or new Murmurs, No Parasternal Heave +ve B.Sounds, Abd Soft, Non tender, No organomegaly appriciated, No rebound -guarding or rigidity. No Cyanosis, Clubbing  or edema, No new Rash or bruise  Data Review   CBC w Diff: Lab Results  Component Value Date   WBC 10.4 11/29/2014   WBC 6.0 04/04/2012   HGB 12.5* 11/29/2014   HGB 14.5 04/04/2012   HCT 37.8* 11/29/2014   HCT 42.9 04/04/2012   PLT 202 11/29/2014   PLT 257 04/04/2012   LYMPHOPCT 18 10/12/2014   LYMPHOPCT 30.5 04/04/2012   MONOPCT 5 10/12/2014   MONOPCT 8.4 04/04/2012   EOSPCT 1 10/12/2014   EOSPCT 1.1 04/04/2012   BASOPCT 0 10/12/2014   BASOPCT 1.1 04/04/2012    CMP: Lab Results  Component Value Date   NA 138 11/29/2014   K 3.5 11/29/2014   CL 103 11/29/2014   CO2 28 11/29/2014   BUN 19 11/29/2014   CREATININE 0.83 11/29/2014   PROT 5.6* 11/29/2014   ALBUMIN 3.1* 11/29/2014   BILITOT 0.8 11/29/2014   ALKPHOS 38* 11/29/2014   AST 33 11/29/2014   ALT 63* 11/29/2014  .   Total Time in preparing paper work, data evaluation and todays exam - 35 minutes  Madelena Maturin M.D on 11/29/2014 at 10:11 AM  [facility] Office  [phone]

## 2014-11-29 NOTE — Discharge Summary (Signed)
Jose [name]. to be D/C'd Home per MD order.  Discussed with the patient and all questions fully answered.    Medication List    STOP taking these medications        FISH OIL CONCENTRATE 1000 MG Caps      TAKE these medications        albuterol 108 (90 BASE) MCG/ACT inhaler  Commonly known as:  PROVENTIL HFA;VENTOLIN HFA  Inhale 2 puffs into the lungs every 6 (six) hours as needed for wheezing or shortness of breath.     amLODipine 5 MG tablet  Commonly known as:  NORVASC  Take 5 mg by mouth daily.     apixaban 5 MG Tabs tablet  Commonly known as:  ELIQUIS  Take 1 tablet (5 mg total) by mouth 2 (two) times daily.     CENTRUM SILVER PO  Take 1 tablet by mouth daily.     fluticasone 50 MCG/ACT nasal spray  Commonly known as:  FLONASE  Place 1 spray into both nostrils daily.     Fluticasone-Salmeterol 250-50 MCG/DOSE Aepb  Commonly known as:  ADVAIR  Inhale 1 puff into the lungs 2 (two) times daily.     gabapentin 300 MG capsule  Commonly known as:  NEURONTIN  Take 300 mg by mouth 2 (two) times daily.     guaiFENesin 600 MG 12 hr tablet  Commonly known as:  MUCINEX  Take 2 tablets (1,200 mg total) by mouth 2 (two) times daily.     labetalol 200 MG tablet  Commonly known as:  NORMODYNE  Take 100 mg by mouth 2 (two) times daily.     losartan-hydrochlorothiazide 50-12.5 MG per tablet  Commonly known as:  HYZAAR  Take 1 tablet by mouth 2 (two) times daily.     moxifloxacin 400 MG tablet  Commonly known as:  AVELOX  Take 400 mg by mouth daily at 8 pm. On day 3     predniSONE 5 MG tablet  Commonly known as:  DELTASONE  Take 30 mg by mouth daily with breakfast. For the next 3 days. Then on Saturday patient tapers down to 10 mg for 3 days     RED YEAST RICE PO  Take 2 tablets by mouth daily.        VVS, Skin clean, dry and intact without evidence of skin break down, no evidence of skin tears noted. IV catheter discontinued intact. Site without signs and  symptoms of complications. Dressing and pressure applied.  An After Visit Summary was printed and given to the patient.  D/c education completed with patient/family including follow up instructions, medication list, d/c activities limitations if indicated, with other d/c instructions as indicated by MD - patient able to verbalize understanding, all questions fully answered.   Patient instructed to return to ED, call 911, or call MD for any changes in condition.   Patient escorted via [place], and D/C home via private auto.  Audria Nine F 11/29/2014 1:14 PM

## 2014-11-30 LAB — CULTURE, BLOOD (ROUTINE X 2)
Culture: NO GROWTH
Culture: NO GROWTH

## 2014-12-01 LAB — CULTURE, BLOOD (ROUTINE X 2)
CULTURE: NO GROWTH
Culture: NO GROWTH

## 2014-12-01 LAB — CULTURE, RESPIRATORY

## 2014-12-01 LAB — CULTURE, RESPIRATORY W GRAM STAIN

## 2014-12-06 ENCOUNTER — Telehealth: Payer: Self-pay | Admitting: Neurology

## 2014-12-06 MED ORDER — APIXABAN 5 MG PO TABS: 5.0000 mg | ORAL_TABLET | Freq: Two times a day (BID) | ORAL | Status: DC

## 2014-12-06 NOTE — Telephone Encounter (Signed)
Rx has been sent.  I called back.  Patient was not available.  Left message.

## 2014-12-06 NOTE — Telephone Encounter (Signed)
Patient is calling to let us know he would like his Rx Eliquis 5 mg sent to [facility].  Please call.

## 2014-12-15 ENCOUNTER — Ambulatory Visit (INDEPENDENT_AMBULATORY_CARE_PROVIDER_SITE_OTHER): Payer: Medicare Other | Admitting: Neurology

## 2014-12-15 ENCOUNTER — Encounter: Payer: Self-pay | Admitting: Neurology

## 2014-12-15 VITALS — BP 108/65 | HR 53 | Ht 72.0 in | Wt 224.8 lb

## 2014-12-15 DIAGNOSIS — G4733 Obstructive sleep apnea (adult) (pediatric): Secondary | ICD-10-CM

## 2014-12-15 DIAGNOSIS — G5622 Lesion of ulnar nerve, left upper limb: Secondary | ICD-10-CM | POA: Diagnosis not present

## 2014-12-15 DIAGNOSIS — G451 Carotid artery syndrome (hemispheric): Secondary | ICD-10-CM | POA: Diagnosis not present

## 2014-12-15 DIAGNOSIS — G459 Transient cerebral ischemic attack, unspecified: Secondary | ICD-10-CM | POA: Insufficient documentation

## 2014-12-15 DIAGNOSIS — I4891 Unspecified atrial fibrillation: Secondary | ICD-10-CM

## 2014-12-15 NOTE — Patient Instructions (Signed)
Sleep Apnea  Sleep apnea is a sleep disorder characterized by abnormal pauses in breathing while you sleep. When your breathing pauses, the level of oxygen in your blood decreases. This causes you to move out of deep sleep and into light sleep. As a result, your quality of sleep is poor, and the system that carries your blood throughout your body (cardiovascular system) experiences stress. If sleep apnea remains untreated, the following conditions can develop:  High blood pressure (hypertension).  Coronary artery disease.  Inability to achieve or maintain an erection (impotence).  Impairment of your thought process (cognitive dysfunction). There are three types of sleep apnea: 1. Obstructive sleep apnea--Pauses in breathing during sleep because of a blocked airway. 2. Central sleep apnea--Pauses in breathing during sleep because the area of the brain that controls your breathing does not send the correct signals to the muscles that control breathing. 3. Mixed sleep apnea--A combination of both obstructive and central sleep apnea. RISK FACTORS The following risk factors can increase your risk of developing sleep apnea:  Being overweight.  Smoking.  Having narrow passages in your nose and throat.  Being of older age.  Being male.  Alcohol use.  Sedative and tranquilizer use.  Ethnicity. Among individuals younger than 35 years, African Americans are at increased risk of sleep apnea. SYMPTOMS   Difficulty staying asleep.  Daytime sleepiness and fatigue.  Loss of energy.  Irritability.  Loud, heavy snoring.  Morning headaches.  Trouble concentrating.  Forgetfulness.  Decreased interest in sex. DIAGNOSIS  In order to diagnose sleep apnea, your caregiver will perform a physical examination. Your caregiver may suggest that you take a home sleep test. Your caregiver may also recommend that you spend the night in a sleep lab. In the sleep lab, several monitors record  information about your heart, lungs, and brain while you sleep. Your leg and arm movements and blood oxygen level are also recorded. TREATMENT The following actions may help to resolve mild sleep apnea:  Sleeping on your side.   Using a decongestant if you have nasal congestion.   Avoiding the use of depressants, including alcohol, sedatives, and narcotics.   Losing weight and modifying your diet if you are overweight. There also are devices and treatments to help open your airway:  Oral appliances. These are custom-made mouthpieces that shift your lower jaw forward and slightly open your bite. This opens your airway.  Devices that create positive airway pressure. This positive pressure "splints" your airway open to help you breathe better during sleep. The following devices create positive airway pressure:  Continuous positive airway pressure (CPAP) device. The CPAP device creates a continuous level of air pressure with an air pump. The air is delivered to your airway through a mask while you sleep. This continuous pressure keeps your airway open.  Nasal expiratory positive airway pressure (EPAP) device. The EPAP device creates positive air pressure as you exhale. The device consists of single-use valves, which are inserted into each nostril and held in place by adhesive. The valves create very little resistance when you inhale but create much more resistance when you exhale. That increased resistance creates the positive airway pressure. This positive pressure while you exhale keeps your airway open, making it easier to breath when you inhale again.  Bilevel positive airway pressure (BPAP) device. The BPAP device is used mainly in patients with central sleep apnea. This device is similar to the CPAP device because it also uses an air pump to deliver continuous air pressure   through a mask. However, with the BPAP machine, the pressure is set at two different levels. The pressure when you  exhale is lower than the pressure when you inhale.  Surgery. Typically, surgery is only done if you cannot comply with less invasive treatments or if the less invasive treatments do not improve your condition. Surgery involves removing excess tissue in your airway to create a wider passage way. Document Released: 10/05/2002 Document Revised: 02/09/2013 Document Reviewed: 02/21/2012 [employer] Patient Information 2015 [employer]. This information is not intended to replace advice given to you by your health care provider. Make sure you discuss any questions you have with your health care provider.  

## 2014-12-15 NOTE — Progress Notes (Signed)
Reason for visit: TIA  Jose [name]. is an 79 y.o. male  History of present illness:  Mr. Biondolillo is a 79 year old right-handed white male with a history of 3 separate TIA-type events. The patient has undergone a stroke workup, and a cardiac monitor study has shown evidence of atrial fibrillation. The patient now is on Eliquis, he is tolerating the medication well. The patient has been in the hospital since last seen, with a recent discharge on the first of February 2016 with pneumonia. The patient has recovered from this. While in the hospital, the patient apparently had witnessed events of sleep apnea, and his primary care physician has wanted to get a sleep study for this reason. The patient wishes to have a study done here. The patient also has a history of a left ulnar neuropathy, he has had recurrence of left ulnar distribution numbness in the hand. He also gives a history of a peripheral neuropathy, and he has a monoclonal antibody. The patient does not have a lot of discomfort in the feet associated with the neuropathy. He reports that he has a chronic issue with elevated CK enzyme elevations, a muscle biopsy done previously was inconclusive. He comes to this office for an evaluation.  Past Medical History  Diagnosis Date  . History of CT scan of head 1997    small vessel disease  . Gallstones 07/1996  . History of ETT 05/2002     no EKG changes  . History of MRI of cervical spine 07/15/2002  . Rectal bleeding 12/2002    colonoscopy  . Hypertension   . MGUS (monoclonal gammopathy of unknown significance) 04/11/2012  . Skin cancer   . Seizures     last seizure 1976  . TIA (transient ischemic attack)   . Peripheral neuropathy   . Pneumonia 10/2014    Past Surgical History  Procedure Laterality Date  . Skin cancer excision  07/1993    left lateral arm  . Keratosis excision  05/1998    left hand  . Ulnar nerve transfer  12/1999    left  . Colonoscopy w/ polypectomy   04/19/2003    tubular adenoma  . Shoulder injection  June 2012    left, [place]  . Thumb injection  June 2012    left, [place]  . Great toe arthrodesis, interphalangeal joint  1978  . Tonsillectomy    . Adenoidectomy    . Cataract extraction Bilateral     Family History  Problem Relation Age of Onset  . Dementia Mother 53    vascular  . Transient ischemic attack Mother   . Dementia Father     after hip fracture  . Hypertension Sister     skin cancer  . Seizures Neg Hx     Social history:  reports that he quit smoking about 46 years ago. He has never used smokeless tobacco. He reports that he drinks about 0.5 oz of alcohol per week. He reports that he does not use illicit drugs.    Allergies  Allergen Reactions  . Tetracycline Hcl     REACTION: rash: fixed drug reaction    Medications:  Current Outpatient Prescriptions on File Prior to Visit  Medication Sig Dispense Refill  . albuterol (PROVENTIL HFA;VENTOLIN HFA) 108 (90 BASE) MCG/ACT inhaler Inhale 2 puffs into the lungs every 6 (six) hours as needed for wheezing or shortness of breath. 1 Inhaler 2  . amLODipine (NORVASC) 5 MG tablet Take 5 mg by  mouth daily.    Marland Kitchen apixaban (ELIQUIS) 5 MG TABS tablet Take 1 tablet (5 mg total) by mouth 2 (two) times daily. 180 tablet 1  . fluticasone (FLONASE) 50 MCG/ACT nasal spray Place 1 spray into both nostrils daily.    . Fluticasone-Salmeterol (ADVAIR) 250-50 MCG/DOSE AEPB Inhale 1 puff into the lungs 2 (two) times daily.    Marland Kitchen gabapentin (NEURONTIN) 300 MG capsule Take 300 mg by mouth 2 (two) times daily.    Marland Kitchen guaiFENesin (MUCINEX) 600 MG 12 hr tablet Take 2 tablets (1,200 mg total) by mouth 2 (two) times daily. 6 tablet 0  . labetalol (NORMODYNE) 200 MG tablet Take 100 mg by mouth 2 (two) times daily.    Marland Kitchen losartan-hydrochlorothiazide (HYZAAR) 50-12.5 MG per tablet Take 1 tablet by mouth 2 (two) times daily.    Marland Kitchen moxifloxacin (AVELOX) 400 MG tablet Take 400 mg by mouth daily at 8 pm.  On day 3    . Multiple Vitamins-Minerals (CENTRUM SILVER PO) Take 1 tablet by mouth daily.     . Red Yeast Rice Extract (RED YEAST RICE PO) Take 2 tablets by mouth daily.     No current facility-administered medications on file prior to visit.    ROS:  Out of a complete 14 system review of symptoms, the patient complains only of the following symptoms, and all other reviewed systems are negative.  Sleep apnea TIA event  Blood pressure 108/65, pulse 53, height 6' (1.829 m), weight 224 lb 12.8 oz (101.969 kg).  Physical Exam  General: The patient is alert and cooperative at the time of the examination.  Skin: No significant peripheral edema is noted.   Neurologic Exam  Mental status: The patient is oriented x 3.  Cranial nerves: Facial symmetry is present. Speech is normal, no aphasia or dysarthria is noted. Extraocular movements are full. Visual fields are full.  Motor: The patient has good strength in all 4 extremities.  Sensory examination: Soft touch sensation is decreased on the right leg relative to the left, symmetric in the arms and face.  Coordination: The patient has good finger-nose-finger and heel-to-shin bilaterally.  Gait and station: The patient has a normal gait. Tandem gait is minimally unsteady. Romberg is negative. No drift is seen.  Reflexes: Deep tendon reflexes are symmetric.   Assessment/Plan:  1. TIA event  2. Atrial fibrillation  3. Left ulnar neuropathy  4. Peripheral neuropathy  5. Sleep apnea  The patient is having new symptoms with the ulnar aspect of the left hand. The patient will be set up for nerve conduction studies of both legs, and on the left arm. EMG will be done on the left arm. The patient will continue the Eliquis. He will be seen by cardiology in the near future. The patient has had events of witnessed sleep apnea while in the hospital, he will be referred for a sleep study. He will follow-up otherwise for the EMG  evaluation.  Jill Alexanders MD 12/15/2014 8:05 PM  [facility] [address] [place], [place] [address]  Phone [phone] Fax [phone]

## 2014-12-22 ENCOUNTER — Telehealth: Payer: Self-pay | Admitting: Neurology

## 2014-12-22 DIAGNOSIS — R519 Headache, unspecified: Secondary | ICD-10-CM

## 2014-12-22 DIAGNOSIS — R51 Headache: Principal | ICD-10-CM

## 2014-12-22 NOTE — Telephone Encounter (Signed)
Patient is calling because he fell down some steps about 45 minutes ago and landed on his face but says he seems to be ok.. Patient wants to know if he should have a CT scan done. Please call and advise. Thank you.

## 2014-12-22 NOTE — Telephone Encounter (Signed)
I called patient. The patient fell and hit his head today. He has a cut on the nose, and he has some neck discomfort. He is on Eliquis as a blood thinner, I will go ahead and get a CT of the head given the fact that he is anticoagulated.

## 2014-12-24 ENCOUNTER — Ambulatory Visit (INDEPENDENT_AMBULATORY_CARE_PROVIDER_SITE_OTHER): Payer: Medicare Other | Admitting: Neurology

## 2014-12-24 ENCOUNTER — Encounter: Payer: Self-pay | Admitting: Neurology

## 2014-12-24 ENCOUNTER — Ambulatory Visit (INDEPENDENT_AMBULATORY_CARE_PROVIDER_SITE_OTHER): Payer: Self-pay | Admitting: Neurology

## 2014-12-24 DIAGNOSIS — G5622 Lesion of ulnar nerve, left upper limb: Secondary | ICD-10-CM

## 2014-12-24 DIAGNOSIS — M545 Low back pain, unspecified: Secondary | ICD-10-CM

## 2014-12-24 DIAGNOSIS — M5010 Cervical disc disorder with radiculopathy, unspecified cervical region: Secondary | ICD-10-CM

## 2014-12-24 DIAGNOSIS — M5382 Other specified dorsopathies, cervical region: Secondary | ICD-10-CM

## 2014-12-24 DIAGNOSIS — G5620 Lesion of ulnar nerve, unspecified upper limb: Secondary | ICD-10-CM | POA: Insufficient documentation

## 2014-12-24 DIAGNOSIS — G609 Hereditary and idiopathic neuropathy, unspecified: Secondary | ICD-10-CM

## 2014-12-24 HISTORY — DX: Lesion of ulnar nerve, unspecified upper limb: G56.20

## 2014-12-24 MED ORDER — PREDNISONE 5 MG PO TABS: ORAL_TABLET | ORAL | Status: DC

## 2014-12-24 NOTE — Progress Notes (Signed)
Please refer to EMG and nerve conduction study procedure note. 

## 2014-12-24 NOTE — Progress Notes (Signed)
Jose [name] is a 79 year old gentleman with a history of a left ulnar transposition the past. He has recently fallen, hit his head. He now complains of neck pain on the left with radiation down the left arm. The patient is being evaluated today for worsening symptoms involving the left hand in ulnar nerve distribution, weakness in the left hand.  EMG and nerve conduction study was done today:  Nerve conduction studies done on the left upper extremity and both lower extremities shows evidence of a primarily axonal peripheral neuropathy of moderate to severe severity. There appears to be evidence of an overlying moderately severe left ulnar neuropathy. EMG evaluation of the left upper extremity shows significant acute denervation involving the left ulnar nerve consistent with an ulnar nerve injury at or around the elbow. The patient has had a prior transposition surgery. EMG also suggest the possibility of a low-grade C8 radiculopathy. Chronic stable denervation was seen in the deltoid muscle possibly suggesting a chronic C5 radiculopathy, but the patient has had rotator cuff surgery in the past, and these findings may be related to the surgery. Acute denervation is seen upper cervical paraspinal muscles suggesting a cervical radiculopathy.   The patient appears to have evidence of a cervical radiculopathy over top of a left ulnar neuropathy with a generalized peripheral neuropathy associated with the MGUS. The patient be sent for cervical MRI, he has been set up for CT of the brain given the fact that he is on anticoagulation and he had a fall with head trauma. I will call in a prescription for prednisone for the neck pain and arm discomfort.

## 2014-12-24 NOTE — Procedures (Signed)
HISTORY:  Jose [name] is a 79 year old gentleman with a history of a left ulnar neuropathy, he had a nerve transposition surgery done in the 1970s. He recently has had some issues with increased numbness of the left hand and some weakness as well. The patient recently fell, struck his face, and now has neck pain, pain radiating down the left arm into the forearm. The patient is being evaluated for a possible neuropathy or a cervical radiculopathy.  NERVE CONDUCTION STUDIES:  Nerve conduction studies were performed on the left upper extremity. The distal motor latencies for the left median and ulnar nerves were normal. The motor amplitudes for these nerves were normal. The F wave latency for the left median nerve was normal, prolonged for the left ulnar nerve. Slowing was seen above and below the elbow for the left ulnar nerve, mild slowing was seen for the left median nerve. The sensory latencies for the left arm reveals a normal sensory latency for the median nerve and for the radial nerve, absent ulnar sensory latency was noted.  Nerve conduction studies were performed on both lower extremities. The distal motor latencies for the peroneal and posterior tibial nerves were normal, with low motor amplitudes for the posterior tibial nerves bilaterally and for the right posterior tibial nerve. The left posterior tibial nerve motor amplitude was normal. The F wave latencies for the peroneal and posterior tibial nerves were prolonged bilaterally with slowing seen for these nerves bilaterally as well. The sensory latencies for the peroneal nerves were normal on the right, absent on the left.  EMG STUDIES:  EMG study was performed on the left upper extremity:  The first dorsal interosseous muscle reveals 2 to 3 K units with markedly decreased recruitment. 3+ fibrillations and positive waves were noted. The abductor pollicis brevis muscle reveals 2 to 4 K units with full recruitment. No  fibrillations or positive waves were noted. The extensor indicis proprius muscle reveals 1 to 3 K units with full recruitment. 1+ fibrillations and positive waves were noted. The pronator teres muscle reveals 2 to 3 K units with full recruitment. No fibrillations or positive waves were noted. The flexor carpi ulnaris muscle (III-IV) reveals 0.5 to 1 K units with markedly reduced recruitment. 2+ fibrillations and positive waves were noted. The biceps muscle reveals 1 to 2 K units with full recruitment. No fibrillations or positive waves were noted. The triceps muscle reveals 1 to 3K units with full recruitment. No fibrillations or positive waves were noted. The anterior deltoid muscle reveals 2 to 6 K units with decreased recruitment. No fibrillations or positive waves were noted. The cervical paraspinal muscles were tested at 2 levels. No abnormalities of insertional activity were seen at the lower level tested. 2+ positive waves were seen in the upper level. There was good relaxation.   IMPRESSION:  Nerve conduction studies done on the left upper extremity and both lower extremities shows evidence of a primarily axonal peripheral neuropathy of moderate to severe severity. There appears to be evidence of an overlying moderately severe left ulnar neuropathy. EMG evaluation of the left upper extremity shows significant acute denervation involving the left ulnar nerve consistent with an ulnar nerve injury at or around the elbow. The patient has had a prior transposition surgery. EMG also suggest the possibility of a low-grade C8 radiculopathy. Chronic stable denervation was seen in the deltoid muscle possibly suggesting a chronic C5 radiculopathy, but the patient has had rotator cuff surgery in the past, and these  findings may be related to the surgery. Acute denervation is seen upper cervical paraspinal muscles suggesting a cervical radiculopathy.  Jose Alexanders MD 12/24/2014 11:10 AM  [facility] [address] [place], [place] [address]  Phone [phone] Fax [phone]

## 2014-12-27 ENCOUNTER — Telehealth: Payer: Self-pay | Admitting: Neurology

## 2014-12-27 ENCOUNTER — Ambulatory Visit
Admission: RE | Admit: 2014-12-27 | Discharge: 2014-12-27 | Disposition: A | Discharge status: Home / Self Care | Payer: Medicare Other | Source: Ambulatory Visit | Attending: Neurology | Admitting: Neurology

## 2014-12-27 DIAGNOSIS — R51 Headache: Secondary | ICD-10-CM | POA: Diagnosis not present

## 2014-12-27 DIAGNOSIS — R519 Headache, unspecified: Secondary | ICD-10-CM

## 2014-12-27 MED ORDER — TRAMADOL HCL 50 MG PO TABS: 50.0000 mg | ORAL_TABLET | Freq: Four times a day (QID) | ORAL | Status: DC | PRN

## 2014-12-27 NOTE — Telephone Encounter (Signed)
I called the patient. The patient is having increased pain in the neck and shoulder. Prednisone is not holding the pain. I will call in a prescription for Ultram. The patient will be having MRI evaluation of the cervical spine. At some point, he may need surgery for the left ulnar nerve issue as well.  CT scan of the brain was done, the results are not yet available, but from my review, there is no evidence of trauma or bleeding following a fall.

## 2014-12-27 NOTE — Telephone Encounter (Signed)
Patient requesting pain medication due to pain has gotten worse in neck, shoulder, arm and hand on left side while lying down to rest, please forward Rx to [facility] [employer].  Scheduled to go out of town and would like something to help with pain during sleep.  Also want to proceed with appointment with Surgeon ASAP.  Please call and advise.

## 2014-12-27 NOTE — Telephone Encounter (Signed)
I have already contacted patient concerning the results of the CT.   CT head 12/27/14:  IMPRESSION: This is a mildly abnormal CT scan of the head showing mild age related atrophy and small vessel ischemic changes. There are no acute findings. Compared to study from December 2015, there has been no interval change.d

## 2014-12-28 ENCOUNTER — Telehealth: Payer: Self-pay | Admitting: Neurology

## 2014-12-28 NOTE — Telephone Encounter (Signed)
Patient is calling back in regard to Rx Ultram 50 mg.  He is very upset and in a lot of pain.  Please send to his pharmacy ASAP.  Thanks!

## 2014-12-28 NOTE — Telephone Encounter (Signed)
Prescription for Ultram has been faxed to outpatient pharmacy [facility] and confirmation received.

## 2014-12-28 NOTE — Telephone Encounter (Signed)
Pt is calling to check on Rx for traMADol (ULTRAM) 50 MG tablet.  Please send to pharmacy soon he is in a lot of pain.

## 2014-12-28 NOTE — Telephone Encounter (Signed)
Rx for Ultram has been faxed and confirmation received to [facility].

## 2015-01-03 ENCOUNTER — Inpatient Hospital Stay: Payer: Medicare Other | Admitting: Pulmonary Disease

## 2015-01-03 ENCOUNTER — Ambulatory Visit
Admission: RE | Admit: 2015-01-03 | Discharge: 2015-01-03 | Disposition: A | Discharge status: Home / Self Care | Payer: Medicare Other | Source: Ambulatory Visit | Attending: Neurology | Admitting: Neurology

## 2015-01-03 ENCOUNTER — Telehealth: Payer: Self-pay | Admitting: Neurology

## 2015-01-03 DIAGNOSIS — E669 Obesity, unspecified: Secondary | ICD-10-CM

## 2015-01-03 DIAGNOSIS — G609 Hereditary and idiopathic neuropathy, unspecified: Secondary | ICD-10-CM

## 2015-01-03 DIAGNOSIS — G5622 Lesion of ulnar nerve, left upper limb: Secondary | ICD-10-CM

## 2015-01-03 DIAGNOSIS — M5010 Cervical disc disorder with radiculopathy, unspecified cervical region: Secondary | ICD-10-CM

## 2015-01-03 DIAGNOSIS — G458 Other transient cerebral ischemic attacks and related syndromes: Secondary | ICD-10-CM

## 2015-01-03 DIAGNOSIS — G4733 Obstructive sleep apnea (adult) (pediatric): Secondary | ICD-10-CM

## 2015-01-03 DIAGNOSIS — I4891 Unspecified atrial fibrillation: Secondary | ICD-10-CM

## 2015-01-03 DIAGNOSIS — M5382 Other specified dorsopathies, cervical region: Secondary | ICD-10-CM

## 2015-01-03 MED ORDER — PREDNISONE 10 MG PO TABS: ORAL_TABLET | ORAL | Status: DC

## 2015-01-03 NOTE — Telephone Encounter (Signed)
I called patient. MRI the brain shows multilevel spondylosis, but there is a right lateral disc herniation the C5-6 level, his pain is mainly on the left side however. There is evidence of possible impingement of the C4, C6 and C7 nerve roots on the left side. This does not correlate well with the EMG evaluation. The patient will be given another enzyme dose pack, 10 mg 12 day pack. I will make referral to a neurosurgeon in part because of the left ulnar nerve problem, but also the patient could potentially require surgical considerations with the neck as well. The patient is on anticoagulant therapy.   MRI cervical 01/03/2015:  IMPRESSION: This is an abnormal MRI of the cervical spine showing multilevel degenerative changes as detailed above. The most significant findings are: 1. Severe uncovertebral spurring C3-C4 causing severe bilateral foraminal narrowing that could lead to compression of either of the C4 nerve roots. 2. Uncovertebral spurring and right lateral disc herniation at C5-C6 severe right and moderate left foraminal narrowing. There is right C6 nerve root compression and there is also potential for dynamic impingement of the left C6 nerve root. There is mild spinal stenosis at this level. 3. Left disc protrusion and mild uncovertebral spurring at C6-C7 causing moderate left foraminal narrowing that could lead to dynamic impingement of the left C7 nerve root .

## 2015-01-03 NOTE — Telephone Encounter (Signed)
Margette Fast, MD refers patient for attended sleep study.  Height: 6'  Weight: 224 lb 12.8 oz  BMI: 30.48  Past Medical History:  History of CT scan of head 1997     small vessel disease  . Gallstones 07/1996  . History of ETT 05/2002     no EKG changes  . History of MRI of cervical spine 07/15/2002  . Rectal bleeding 12/2002    colonoscopy  . Hypertension   . MGUS (monoclonal gammopathy of unknown significance) 04/11/2012  . Skin cancer   . Seizures     last seizure 1976  . TIA (transient ischemic attack)   . Peripheral neuropathy   . Pneumonia 10/2014            Sleep Symptoms: While in the hospital, the patient apparently had witnessed events of sleep apnea, and his primary care physician has wanted to get a sleep study for this reason.   Epworth Score: Unable to reach the patient  Medication:  Albuterol Sulfate (Aero Soln) PROVENTIL HFA;VENTOLIN HFA 108 (90 BASE) MCG/ACT Inhale 2 puffs into the lungs every 6 (six) hours as needed for wheezing or shortness of breath.       AmLODIPine Besylate (Tab) NORVASC 5 MG Take 5 mg by mouth daily.      Apixaban (Tab) ELIQUIS 5 MG Take 1 tablet (5 mg total) by mouth 2 (two) times daily.      Fluticasone Propionate (Suspension) FLONASE 50 MCG/ACT Place 1 spray into both nostrils daily.      Fluticasone-Salmeterol (Aerosol Powder, Breath Activtivatede) ADVAIR 250-50 MCG/DOSE Inhale 1 puff into the lungs 2 (two) times daily.      Gabapentin (Cap) NEURONTIN 300 MG Take 300 mg by mouth 2 (two) times daily.      GuaiFENesin (Tablet SR 12 hr) MUCINEX 600 MG Take 2 tablets (1,200 mg total) by mouth 2 (two) times daily.      Labetalol HCl (Tab) NORMODYNE 200 MG Take 100 mg by mouth 2 (two) times daily.      Losartan Potassium-HCTZ (Tab) HYZAAR 50-12.5 MG Take 1 tablet by mouth 2 (two) times daily.      Moxifloxacin HCl (Tab) AVELOX 400 MG Take 400 mg by mouth daily  at 8 pm. On day 3      Multiple Vitamins-Minerals   Take 1 tablet by mouth daily.       PredniSONE (Tab) DELTASONE 5 MG Begin taking 6 tablets daily, taper by one tablet daily until off the medication.      Red Yeast Rice Extract   Take 2 tablets by mouth daily.      TraMADol HCl (Tab) ULTRAM 50 MG Take 1 tablet (50 mg total) by mouth every 6 (six) hours as needed.      Ins: Medicare/Tricare   Assessment & Plan:  TIA event  2. Atrial fibrillation  3. Left ulnar neuropathy  4. Peripheral neuropathy  5. Sleep apnea  The patient is having new symptoms with the ulnar aspect of the left hand. The patient will be set up for nerve conduction studies of both legs, and on the left arm. EMG will be done on the left arm. The patient will continue the Eliquis. He will be seen by cardiology in the near future. The patient has had events of witnessed sleep apnea while in the hospital, he will be referred for a sleep study. He will follow-up otherwise for the EMG evaluation.  Please review patient information and submit instructions for scheduling  and orders for sleep technologist. Thank you.

## 2015-01-04 ENCOUNTER — Telehealth: Payer: Self-pay | Admitting: *Deleted

## 2015-01-04 NOTE — Telephone Encounter (Signed)
In House Sleep study request review: This patient has an underlying medical history of new onset A. fib, obesity, reflux disease, TIA, hypertension, MGUS, ulnar neuropathy and is referred by Dr. Jannifer Franklin for an attended sleep study due to a report of witnessed apneas while he was recently hospitalized. He is reported to snore. I will order a split-night sleep study and see the patient in sleep medicine consultation afterwards as appropriate. Please print this note and attach to sleep study chart/package.   Sleep Acquisition Technologist instructions: Please score at  4% and split if 2 hour estimated AHI >15/h, unless mandated otherwise by the insurance carrier.    Star Age, MD, PhD [facility])

## 2015-01-04 NOTE — Telephone Encounter (Signed)
Patient calling to make sure his referral was sent which it was and I gave him the number to [facility]. Patient also wanted to know if his Rx was put in the system, which it was. If patient has any other questions he will call back.

## 2015-01-06 ENCOUNTER — Telehealth: Payer: Self-pay | Admitting: Neurology

## 2015-01-06 NOTE — Telephone Encounter (Signed)
Spoke with patient and he had a fall, seeing a Neurosurgeon on 01/10/15, will like to postpone study for about 2 weeks. He would like to also have the results of his NCV/EMG study sent to their office so that they may have for his visit([facility]).

## 2015-01-06 NOTE — Telephone Encounter (Signed)
NCV/EMG has been faxed to [facility] at [phone] and confirmation received.

## 2015-01-11 ENCOUNTER — Ambulatory Visit (INDEPENDENT_AMBULATORY_CARE_PROVIDER_SITE_OTHER): Payer: Medicare Other | Admitting: Pulmonary Disease

## 2015-01-11 ENCOUNTER — Encounter: Payer: Self-pay | Admitting: Pulmonary Disease

## 2015-01-11 ENCOUNTER — Other Ambulatory Visit: Payer: Medicare Other

## 2015-01-11 VITALS — BP 142/86 | HR 80 | Ht 72.0 in | Wt 221.0 lb

## 2015-01-11 DIAGNOSIS — R938 Abnormal findings on diagnostic imaging of other specified body structures: Secondary | ICD-10-CM

## 2015-01-11 DIAGNOSIS — J479 Bronchiectasis, uncomplicated: Secondary | ICD-10-CM | POA: Diagnosis not present

## 2015-01-11 DIAGNOSIS — J189 Pneumonia, unspecified organism: Secondary | ICD-10-CM

## 2015-01-11 DIAGNOSIS — R9389 Abnormal findings on diagnostic imaging of other specified body structures: Secondary | ICD-10-CM

## 2015-01-11 NOTE — Assessment & Plan Note (Addendum)
Jose [name] has been referred to my clinic from a recent hospital visit for evaluation of an abnormal CT scan of the chest, see below. On my review the CT chest I see mild to moderate bronchiectasis in the bases of the lungs which I believe are the explanation for his lifelong recurrent episodes of bronchitis and mild dyspnea. I explained to him today that this may be due to a pertussis-like illness that he experienced as a child but he needs to be assessed for immunoglobulin deficiency. He has had recurrent sinopulmonary infections over the years and so we need to assess for that.  It is unclear to me if the monoclonal gammopathy is related to his recurrent sinopulmonary infections.  Fortunately he does not have daily symptoms and so I believe his bronchiectasis is mild at best.  Plan: -Check immunoglobulin profile -Advised today to use the flutter valve when he has episodes of bronchitis -Use Mucinex when he has bronchitis

## 2015-01-11 NOTE — Progress Notes (Signed)
Subjective:    Patient ID: Jose [name]., male    DOB: November 17, 1935, 78 y.o.   MRN: [id number]  HPI  Chief Complaint  Patient presents with  . Hospitalization Follow-up   This is a very pleasant 80 year old male who comes to our clinic today for a hospital follow-up after he was hospitalized in January 2016 for pneumonia. He tells me that since his hospital visit his shortness of breath and cough have improved dramatically and he now feels back to baseline. However, unfortunately he has been experiencing left-sided tingling and numbness after a fall. He believes he may have had a cervical spine injury.  He tells me that ever since childhood he has had "difficulty getting his wind" with things like sports and heavy activity. 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 He started smoking cigarettes at age 89 and smoked 1-2 packs of cigarettes daily for 13 years. He quit in 1969. He stated that he remembered having recurrent pulmonary infections as a child and was told that he had "something like pertussis". Ever since then he says he has had recurrent pulmonary infections over the years. However, he was never hospitalized until 2016 when he was hospitalized for pneumonia at [facility] as well as a brief hospital stay in [place]. He also notes that he has had recurrent sinus infections over the years as well. I asked him about his diagnosis of monoclonal gammopathy of undetermined significance and he was unaware of this diagnosis. He says that he has never been tested for an immunoglobulin deficiency to his knowledge.  He has never been told in the past that he has a pulmonary problem with the exception of "asthmatic bronchitis"   Past Medical History  Diagnosis Date  . History of CT scan of head 1997    small vessel disease  . Gallstones 07/1996  . History of ETT 05/2002     no EKG changes  . History of MRI of cervical spine 07/15/2002    . Rectal bleeding 12/2002    colonoscopy  . Hypertension   . MGUS (monoclonal gammopathy of unknown significance) 04/11/2012  . Skin cancer   . Seizures     last seizure 1976  . TIA (transient ischemic attack)   . Peripheral neuropathy   . Pneumonia 10/2014  . 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 Dementia Mother 19    vascular  . Transient ischemic attack Mother   . Dementia Father     after hip fracture  . Hypertension Sister     skin cancer  . Seizures Neg Hx      History   Social History  . Marital Status: Married    Spouse Name: Jose [name]  . Number of Children: 2  . Years of Education: N/A   Occupational History  . retired    Social History Main Topics  . Smoking status: Former Smoker -- 15 years    Quit date: 03/05/1968  . Smokeless tobacco: Never Used  . Alcohol Use: 0.5 oz/week    1 Standard drinks or equivalent per week  . Drug Use: No  . Sexual Activity: Not on file   Other Topics Concern  . Not on file   Social History Narrative   Lives with 4th wife, Jose [name], married 2010   Retired, Dealer  psychology   [address] [place], [name], 2 children   Daughter, Jose [name], in [place], 2 children   Patient is right handed   Patient drinks approximately 2 cups caffeine daily     Allergies  Allergen Reactions  . Tetracycline Hcl     REACTION: rash: fixed drug reaction     Outpatient Prescriptions Prior to Visit  Medication Sig Dispense Refill  . amLODipine (NORVASC) 5 MG tablet Take 5 mg by mouth daily.    Marland Kitchen apixaban (ELIQUIS) 5 MG TABS tablet Take 1 tablet (5 mg total) by mouth 2 (two) times daily. 180 tablet 1  . fluticasone (FLONASE) 50 MCG/ACT nasal spray Place 1 spray into both nostrils daily.    Marland Kitchen gabapentin (NEURONTIN) 300 MG capsule Take 300 mg by mouth 2 (two) times daily.    Marland Kitchen labetalol (NORMODYNE) 200 MG tablet Take 100 mg by mouth 2 (two)  times daily.    Marland Kitchen losartan-hydrochlorothiazide (HYZAAR) 50-12.5 MG per tablet Take 1 tablet by mouth 2 (two) times daily.    . Multiple Vitamins-Minerals (CENTRUM SILVER PO) Take 1 tablet by mouth daily.     . predniSONE (DELTASONE) 10 MG tablet Begin taking 6 tablets daily, taper by one tablet every other day until off the medication. 42 tablet 0  . Red Yeast Rice Extract (RED YEAST RICE PO) Take 2 tablets by mouth daily.    . traMADol (ULTRAM) 50 MG tablet Take 1 tablet (50 mg total) by mouth every 6 (six) hours as needed. 60 tablet 1  . albuterol (PROVENTIL HFA;VENTOLIN HFA) 108 (90 BASE) MCG/ACT inhaler Inhale 2 puffs into the lungs every 6 (six) hours as needed for wheezing or shortness of breath. 1 Inhaler 2  . Fluticasone-Salmeterol (ADVAIR) 250-50 MCG/DOSE AEPB Inhale 1 puff into the lungs 2 (two) times daily.    Marland Kitchen guaiFENesin (MUCINEX) 600 MG 12 hr tablet Take 2 tablets (1,200 mg total) by mouth 2 (two) times daily. 6 tablet 0  . moxifloxacin (AVELOX) 400 MG tablet Take 400 mg by mouth daily at 8 pm. On day 3     No facility-administered medications prior to visit.        Review of Systems  Constitutional: Negative for fever, chills, activity change and appetite change.  HENT: Negative for congestion, ear pain, hearing loss, postnasal drip, rhinorrhea, sinus pressure and sneezing.   Eyes: Negative for redness, itching and visual disturbance.  Respiratory: Negative for cough, chest tightness, shortness of breath and wheezing.   Cardiovascular: Negative for chest pain, palpitations and leg swelling.  Gastrointestinal: Negative for nausea, vomiting, abdominal pain, diarrhea, constipation, blood in stool and abdominal distention.  Musculoskeletal: Negative for myalgias, joint swelling, arthralgias, gait problem, neck pain and neck stiffness.  Skin: Negative for rash.  Neurological: Positive for weakness and numbness. Negative for dizziness, light-headedness and headaches.    Hematological: Does not bruise/bleed easily.  Psychiatric/Behavioral: Negative for confusion and dysphoric mood.       Objective:   Physical Exam Filed Vitals:   01/11/15 1412  BP: 142/86  Pulse: 80  Height: 6' (1.829 m)  Weight: 221 lb (100.245 kg)  SpO2: 92%    Gen: well appearing, no acute distress HEENT: NCAT, PERRL, EOMi, OP clear, neck supple without masses PULM: CTA B CV: RRR, slight systolic murmur, no JVD AB: BS+, soft, nontender, no hsm Ext: warm, no edema, no clubbing, no cyanosis Derm: no rash or skin breakdown Neuro: A&Ox4, CN II-XII intact, strength 5/5 in all 4  extremities  10/2014 CT chest reviewed> left lower lobe pneumonia with mucous plugging and likely broncholith of a subsegment of the left lower lobe      Assessment & Plan:   Bronchiectasis without acute exacerbation Mr. Reither has been referred to my clinic from a recent hospital visit for evaluation of an abnormal CT scan of the chest, see below. On my review the CT chest I see mild to moderate bronchiectasis in the bases of the lungs which I believe are the explanation for his lifelong recurrent episodes of bronchitis and mild dyspnea. I explained to him today that this may be due to a pertussis-like illness that he experienced as a child but he needs to be assessed for immunoglobulin deficiency. He has had recurrent sinopulmonary infections over the years and so we need to assess for that.  It is unclear to me if the monoclonal gammopathy is related to his recurrent sinopulmonary infections.  Fortunately he does not have daily symptoms and so I believe his bronchiectasis is mild at best.  Plan: -Check immunoglobulin profile -Advised today to use the flutter valve when he has episodes of bronchitis -Use Mucinex when he has bronchitis    Abnormal CT scan, chest During his hospital visit he had a left lower lobe pneumonia with what appeared to be mucus plugging of a left lower lobe subsegment. I  believe that this is related to his bronchiectasis.  It would be very difficult to assess for improvement with just a chest x-ray alone. If he has persistent obstruction of the segmental airway then he will need a bronchoscopy to ensure there is no endobronchial mass considering his past smoking history.  Plan: -CT chest    Updated Medication List Outpatient Encounter Prescriptions as of 01/11/2015  Medication Sig  . amLODipine (NORVASC) 5 MG tablet Take 5 mg by mouth daily.  Marland Kitchen apixaban (ELIQUIS) 5 MG TABS tablet Take 1 tablet (5 mg total) by mouth 2 (two) times daily.  . fluticasone (FLONASE) 50 MCG/ACT nasal spray Place 1 spray into both nostrils daily.  Marland Kitchen gabapentin (NEURONTIN) 300 MG capsule Take 300 mg by mouth 2 (two) times daily.  Marland Kitchen labetalol (NORMODYNE) 200 MG tablet Take 100 mg by mouth 2 (two) times daily.  Marland Kitchen losartan-hydrochlorothiazide (HYZAAR) 50-12.5 MG per tablet Take 1 tablet by mouth 2 (two) times daily.  . Multiple Vitamins-Minerals (CENTRUM SILVER PO) Take 1 tablet by mouth daily.   . predniSONE (DELTASONE) 10 MG tablet Begin taking 6 tablets daily, taper by one tablet every other day until off the medication.  . Red Yeast Rice Extract (RED YEAST RICE PO) Take 2 tablets by mouth daily.  . traMADol (ULTRAM) 50 MG tablet Take 1 tablet (50 mg total) by mouth every 6 (six) hours as needed.  . [DISCONTINUED] albuterol (PROVENTIL HFA;VENTOLIN HFA) 108 (90 BASE) MCG/ACT inhaler Inhale 2 puffs into the lungs every 6 (six) hours as needed for wheezing or shortness of breath.  . [DISCONTINUED] Fluticasone-Salmeterol (ADVAIR) 250-50 MCG/DOSE AEPB Inhale 1 puff into the lungs 2 (two) times daily.  . [DISCONTINUED] guaiFENesin (MUCINEX) 600 MG 12 hr tablet Take 2 tablets (1,200 mg total) by mouth 2 (two) times daily.  . [DISCONTINUED] moxifloxacin (AVELOX) 400 MG tablet Take 400 mg by mouth daily at 8 pm. On day 3

## 2015-01-11 NOTE — Patient Instructions (Signed)
We will order a CT scan of the chest to assess for improvement in pneumonia We will call you with the results of the bloodwork If you get an episode of bronchitis I recommend that she take Mucinex twice a day and use the flutter valve or to 5 times per day We will see you back in 6-8 weeks

## 2015-01-11 NOTE — Assessment & Plan Note (Signed)
During his hospital visit he had a left lower lobe pneumonia with what appeared to be mucus plugging of a left lower lobe subsegment. I believe that this is related to his bronchiectasis.  It would be very difficult to assess for improvement with just a chest x-ray alone. If he has persistent obstruction of the segmental airway then he will need a bronchoscopy to ensure there is no endobronchial mass considering his past smoking history.  Plan: -CT chest

## 2015-01-12 ENCOUNTER — Other Ambulatory Visit: Payer: Self-pay | Admitting: Neurosurgery

## 2015-01-12 DIAGNOSIS — M5412 Radiculopathy, cervical region: Secondary | ICD-10-CM

## 2015-01-12 LAB — IGG, IGA, IGM
IGA: 71 mg/dL (ref 68–379)
IGG (IMMUNOGLOBIN G), SERUM: 784 mg/dL (ref 650–1600)
IGM, SERUM: 92 mg/dL (ref 41–251)

## 2015-01-13 ENCOUNTER — Telehealth: Payer: Self-pay

## 2015-01-13 ENCOUNTER — Other Ambulatory Visit: Payer: Self-pay | Admitting: Neurology

## 2015-01-13 NOTE — Telephone Encounter (Signed)
The pharmacy sent a refill request for Prednisone taper dose.  By viewing notes, a 12 day dose was called in on 03/07.  I called the patient to clarify.  He said he is not yet out of medication, currently at 2 daily.  Indicates his appt for Myelogram is on 03/28.  He would like to know if one more Prednisone taper could be called in so he can continue this med until his appt.  Feels it has been quite helpful, and is afraid pain will return without it.  Says he took it a bit later than normal one day, and could tell a difference.  Okay to refill?  Please advise.  Thank you.

## 2015-01-13 NOTE — Telephone Encounter (Signed)
The patient is on the prednisone dose pack, has gained good pain control, I would go ahead and taper off of the medication, if the pain does return, he is to contact our office.

## 2015-01-14 ENCOUNTER — Ambulatory Visit (INDEPENDENT_AMBULATORY_CARE_PROVIDER_SITE_OTHER)
Admission: RE | Admit: 2015-01-14 | Discharge: 2015-01-14 | Disposition: A | Discharge status: Home / Self Care | Payer: Medicare Other | Source: Ambulatory Visit | Attending: Pulmonary Disease | Admitting: Pulmonary Disease

## 2015-01-14 DIAGNOSIS — J189 Pneumonia, unspecified organism: Secondary | ICD-10-CM

## 2015-01-14 NOTE — Telephone Encounter (Signed)
Jose Ducking, MD at 01/13/2015 8:28 PM     Status: Signed       Expand All Collapse All   The patient is on the prednisone dose pack, has gained good pain control, I would go ahead and taper off of the medication, if the pain does return, he is to contact our office.

## 2015-01-24 ENCOUNTER — Ambulatory Visit
Admission: RE | Admit: 2015-01-24 | Discharge: 2015-01-24 | Disposition: A | Discharge status: Home / Self Care | Payer: Medicare Other | Source: Ambulatory Visit | Attending: Neurosurgery | Admitting: Neurosurgery

## 2015-01-24 DIAGNOSIS — M5412 Radiculopathy, cervical region: Secondary | ICD-10-CM

## 2015-01-24 MED ORDER — MEPERIDINE HCL 100 MG/ML IJ SOLN
75.0000 mg | Freq: Once | INTRAMUSCULAR | Status: AC
  Administered 2015-01-24: 75 mg via INTRAMUSCULAR

## 2015-01-24 MED ORDER — ONDANSETRON HCL 4 MG/2ML IJ SOLN
4.0000 mg | Freq: Once | INTRAMUSCULAR | Status: AC
  Administered 2015-01-24: 4 mg via INTRAMUSCULAR

## 2015-01-24 MED ORDER — IOHEXOL 300 MG/ML  SOLN
10.0000 mL | Freq: Once | INTRAMUSCULAR | Status: AC | PRN
  Administered 2015-01-24: 10 mL via INTRATHECAL

## 2015-01-24 MED ORDER — DIAZEPAM 5 MG PO TABS
5.0000 mg | ORAL_TABLET | Freq: Once | ORAL | Status: AC
  Administered 2015-01-24: 5 mg via ORAL

## 2015-01-24 MED ORDER — ONDANSETRON HCL 4 MG/2ML IJ SOLN: 4.0000 mg | Freq: Four times a day (QID) | INTRAMUSCULAR | Status: DC | PRN

## 2015-01-24 NOTE — Discharge Instructions (Signed)
Myelogram Discharge Instructions  1. Go home and rest quietly for the next 24 hours.  It is important to lie flat for the next 24 hours.  Get up only to go to the restroom.  You may lie in the bed or on a couch on your back, your stomach, your left side or your right side.  You may have one pillow under your head.  You may have pillows between your knees while you are on your side or under your knees while you are on your back.  2. DO NOT drive today.  Recline the seat as far back as it will go, while still wearing your seat belt, on the way home.  3. You may get up to go to the bathroom as needed.  You may sit up for 10 minutes to eat.  You may resume your normal diet and medications unless otherwise indicated.  Drink plenty of extra fluids today and tomorrow.  4. The incidence of a spinal headache with nausea and/or vomiting is about 5% (one in 20 patients).  If you develop a headache, lie flat and drink plenty of fluids until the headache goes away.  Caffeinated beverages may be helpful.  If you develop severe nausea and vomiting or a headache that does not go away with flat bed rest, call [phone].  5. You may resume normal activities after your 24 hours of bed rest is over; however, do not exert yourself strongly or do any heavy lifting tomorrow.  6. Call your physician for a follow-up appointment.   You may resume Eliquis today.  You may resume Tramadol on Tuesday, January 25, 2015 after 3:00p.m.

## 2015-01-24 NOTE — Progress Notes (Signed)
Pt states he has been off Eliquis and Tramadol for the past 2 days.  Discharge instructions explained to pt.

## 2015-01-26 ENCOUNTER — Other Ambulatory Visit: Payer: Self-pay | Admitting: Neurosurgery

## 2015-02-02 ENCOUNTER — Encounter (HOSPITAL_COMMUNITY): Payer: Self-pay

## 2015-02-02 ENCOUNTER — Encounter (HOSPITAL_COMMUNITY)
Admission: RE | Admit: 2015-02-02 | Discharge: 2015-02-02 | Disposition: A | Discharge status: Home / Self Care | Payer: Medicare Other | Source: Ambulatory Visit | Attending: Neurosurgery | Admitting: Neurosurgery

## 2015-02-02 HISTORY — DX: Cardiac arrhythmia, unspecified: I49.9

## 2015-02-02 HISTORY — DX: Unspecified osteoarthritis, unspecified site: M19.90

## 2015-02-02 HISTORY — DX: Sleep apnea, unspecified: G47.30

## 2015-02-02 HISTORY — DX: Cerebral infarction, unspecified: I63.9

## 2015-02-02 LAB — CBC
HCT: 38.5 % — ABNORMAL LOW (ref 39.0–52.0)
Hemoglobin: 13.3 g/dL (ref 13.0–17.0)
MCH: 32.2 pg (ref 26.0–34.0)
MCHC: 34.5 g/dL (ref 30.0–36.0)
MCV: 93.2 fL (ref 78.0–100.0)
Platelets: 200 10*3/uL (ref 150–400)
RBC: 4.13 MIL/uL — ABNORMAL LOW (ref 4.22–5.81)
RDW: 13.4 % (ref 11.5–15.5)
WBC: 5.1 10*3/uL (ref 4.0–10.5)

## 2015-02-02 LAB — BASIC METABOLIC PANEL
ANION GAP: 10 (ref 5–15)
BUN: 18 mg/dL (ref 6–23)
CALCIUM: 9.1 mg/dL (ref 8.4–10.5)
CO2: 31 mmol/L (ref 19–32)
Chloride: 100 mmol/L (ref 96–112)
Creatinine, Ser: 1.02 mg/dL (ref 0.50–1.35)
GFR, EST AFRICAN AMERICAN: 80 mL/min — AB (ref >90)
GFR, EST NON AFRICAN AMERICAN: 69 mL/min — AB (ref >90)
Glucose, Bld: 130 mg/dL — ABNORMAL HIGH (ref 70–99)
POTASSIUM: 3.4 mmol/L — AB (ref 3.5–5.1)
Sodium: 141 mmol/L (ref 135–145)

## 2015-02-02 LAB — SURGICAL PCR SCREEN
MRSA, PCR: NEGATIVE
STAPHYLOCOCCUS AUREUS: NEGATIVE

## 2015-02-02 NOTE — Pre-Procedure Instructions (Addendum)
Jose [name].  02/02/2015   Your procedure is scheduled on:  02/04/15  Report to [facility] short stay admitting at 64 AM.  Call this number if you have problems the morning of surgery: [phone]   Remember:   Do not eat food or drink liquids after midnight.   Take these medicines the morning of surgery with A SIP OF WATER: amodipine, gabapentin, labetalol.    Stop eliquis per dr   Bridgette Habermann all herbel meds, nsaids (aleve,naproxen,advil,ibuprofen) now including vitamins, fish oil ,red yeast rice extract   Do not wear jewelry, make-up or nail polish.  Do not wear lotions, powders, or perfumes. You may wear deodorant.  Do not shave 48 hours prior to surgery. Men may shave face and neck.  Do not bring valuables to the hospital.  [facility] is not responsible                  for any belongings or valuables.               Contacts, dentures or bridgework may not be worn into surgery.  Leave suitcase in the car. After surgery it may be brought to your room.  For patients admitted to the hospital, discharge time is determined by your                treatment team.               Patients discharged the day of surgery will not be allowed to drive  home.  Name and phone number of your driver:   Special Instructions:  Special Instructions: [place] - Preparing for Surgery  Before surgery, you can play an important role.  Because skin is not sterile, your skin needs to be as free of germs as possible.  You can reduce the number of germs on you skin by washing with CHG (chlorahexidine gluconate) soap before surgery.  CHG is an antiseptic cleaner which kills germs and bonds with the skin to continue killing germs even after washing.  Please DO NOT use if you have an allergy to CHG or antibacterial soaps.  If your skin becomes reddened/irritated stop using the CHG and inform your nurse when you arrive at Short Stay.  Do not shave (including legs and underarms) for at least 48 hours prior to  the first CHG shower.  You may shave your face.  Please follow these instructions carefully:   1.  Shower with CHG Soap the night before surgery and the morning of Surgery.  2.  If you choose to wash your hair, wash your hair first as usual with your normal shampoo.  3.  After you shampoo, rinse your hair and body thoroughly to remove the Shampoo.  4.  Use CHG as you would any other liquid soap.  You can apply chg directly  to the skin and wash gently with scrungie or a clean washcloth.  5.  Apply the CHG Soap to your body ONLY FROM THE NECK DOWN.  Do not use on open wounds or open sores.  Avoid contact with your eyes ears, mouth and genitals (private parts).  Wash genitals (private parts)       with your normal soap.  6.  Wash thoroughly, paying special attention to the area where your surgery will be performed.  7.  Thoroughly rinse your body with warm water from the neck down.  8.  DO NOT shower/wash with your normal soap after using and rinsing  off the CHG Soap.  9.  Pat yourself dry with a clean towel.            10.  Wear clean pajamas.            11.  Place clean sheets on your bed the night of your first shower and do not sleep with pets.  Day of Surgery  Do not apply any lotions/deodorants the morning of surgery.  Please wear clean clothes to the hospital/surgery center.   Please read over the following fact sheets that you were given: Pain Booklet, Coughing and Deep Breathing, MRSA Information and Surgical Site Infection Prevention

## 2015-02-02 NOTE — Progress Notes (Addendum)
Office called to request orders be released. Spoke with crystal  req'd office notes, cardiac tests done from dr barrett [facility] [phone]

## 2015-02-03 ENCOUNTER — Encounter (HOSPITAL_COMMUNITY): Payer: Self-pay | Admitting: Anesthesiology

## 2015-02-03 ENCOUNTER — Encounter (HOSPITAL_COMMUNITY): Payer: Self-pay

## 2015-02-03 ENCOUNTER — Telehealth: Payer: Self-pay

## 2015-02-03 MED ORDER — CEFAZOLIN SODIUM-DEXTROSE 2-3 GM-% IV SOLR
2.0000 g | INTRAVENOUS | Status: AC
  Administered 2015-02-04: 2 g via INTRAVENOUS

## 2015-02-03 MED ORDER — DEXAMETHASONE SODIUM PHOSPHATE 10 MG/ML IJ SOLN: 10.0000 mg | INTRAMUSCULAR | Status: DC

## 2015-02-03 NOTE — Telephone Encounter (Signed)
He is schedule surgery tomorrow, had TIA in Dec..Please Carmelina Peal [phone] she has questions

## 2015-02-03 NOTE — Telephone Encounter (Signed)
I have already talked with Ebony Hail. Okay to stop the Eliquis, restart whenever feasible after surgery.

## 2015-02-03 NOTE — Progress Notes (Signed)
Anesthesia Chart Review:  Patient is a 79 year old male scheduled for C5-6, C6-7 ACDF on 02/04/15 by Dr. Hal Neer.  History includes former smoker, afib, HTN, MGUS, skin cancer, seizures (last '76), TIA 10/13/14 with work-up revealing PAF (started on Eliquis 11/22/14), hiatal hernia, suspected OSA (sleep study pending), LLL PNA 10/2014 (resolved by 01/14/15 Chest CT). Primary Cardiologist is Dr. Gwenith Spitz [phone]). Pulmonologist is Dr. Lake Bells. Neurologist is Dr. Jannifer Franklin. PCP is Charlcie Cradle. Cardiologist is Dr. Maple Hudson, newly established 12/27/14 for on-going treatment of PAF.  He agreed with Eliquis, but did not see any indication for antiarrhythmic therapy at this point. Six month cardiology follow-up recommended.  Meds include Eliquis (held 01/28/15 for surgery), Flonase, labetalol, Hyzaar, Fish Oil.   11/24/14 EKG: NSR, LAD. PAT HR 69 bpm. EKG done at Dr. Gilman Schmidt office (computer dated 10/29/1997, but presume this was done on 12/27/14 as this was his first visit, showed NSR, LAFB, poor r wave progression.   11/10/14 - 12/06/14 Event monitor: Average HR 71 BPM. Tachycardia present 4%, bradycardia 20%. Afib 3% with fastest episode 143 bpm and longest episode 03:34:44. No pauses noted > 3 seconds.  10/13/14 Echo: Left ventricle: The cavity size was normal. Systolic function was normal. The estimated ejection fraction was in the range of 50%to 55%. Wall motion was normal; there were no regional wall motion abnormalities. Left ventricular diastolic function parameters were normal. Mild pulmonic regurgitation.  10/13/14 Carotid duplex:  - The vertebral arteries appear patent with antegrade flow. - Findings consistent with 1-39 percent cerebrovascular disease involving the right internal carotid artery and the left internal carotid artery.  11/02/14 EEG: Impression: This is a normal EEG recording in the waking state. No evidence of ictal or interictal discharges are seen.  Preoperative labs noted.    Discussed above with anesthesiologist Dr. Suzette Battiest.  Recommend input from neurologist Dr. Jannifer Franklin regarding safety of proceeding with surgery due to TIA in 09/2015 and regarding if bridging therapy is indicated while patient is off Eliquis.  I spoke with Dr. Jannifer Franklin. He is okay with patient proceeding with surgery from his standpoint and felt it was okay to come off Eliquis without bridging but to restart as soon as feasible following surgery.   George Hugh [facility] Short Stay Center/Anesthesiology Phone [phone] 02/03/2015 6:02 PM

## 2015-02-04 ENCOUNTER — Inpatient Hospital Stay (HOSPITAL_COMMUNITY): Payer: Medicare Other | Admitting: Vascular Surgery

## 2015-02-04 ENCOUNTER — Inpatient Hospital Stay (HOSPITAL_COMMUNITY): Payer: Medicare Other | Admitting: Anesthesiology

## 2015-02-04 ENCOUNTER — Encounter (HOSPITAL_COMMUNITY)
Admission: RE | Disposition: A | Discharge status: Home / Self Care | Payer: Self-pay | Source: Ambulatory Visit | Attending: Neurosurgery

## 2015-02-04 ENCOUNTER — Inpatient Hospital Stay (HOSPITAL_COMMUNITY)
Admission: RE | Admit: 2015-02-04 | Discharge: 2015-02-05 | DRG: 473 | Disposition: A | Discharge status: Home / Self Care | Payer: Medicare Other | Source: Ambulatory Visit | Attending: Neurosurgery | Admitting: Neurosurgery

## 2015-02-04 ENCOUNTER — Inpatient Hospital Stay (HOSPITAL_COMMUNITY): Payer: Medicare Other

## 2015-02-04 DIAGNOSIS — G4730 Sleep apnea, unspecified: Secondary | ICD-10-CM | POA: Diagnosis present

## 2015-02-04 DIAGNOSIS — M4802 Spinal stenosis, cervical region: Secondary | ICD-10-CM | POA: Diagnosis present

## 2015-02-04 DIAGNOSIS — M5022 Other cervical disc displacement, mid-cervical region: Secondary | ICD-10-CM | POA: Diagnosis present

## 2015-02-04 DIAGNOSIS — Z8673 Personal history of transient ischemic attack (TIA), and cerebral infarction without residual deficits: Secondary | ICD-10-CM

## 2015-02-04 DIAGNOSIS — I10 Essential (primary) hypertension: Secondary | ICD-10-CM | POA: Diagnosis present

## 2015-02-04 DIAGNOSIS — Z9842 Cataract extraction status, left eye: Secondary | ICD-10-CM | POA: Diagnosis not present

## 2015-02-04 DIAGNOSIS — Z87891 Personal history of nicotine dependence: Secondary | ICD-10-CM

## 2015-02-04 DIAGNOSIS — Z9841 Cataract extraction status, right eye: Secondary | ICD-10-CM | POA: Diagnosis not present

## 2015-02-04 DIAGNOSIS — Z419 Encounter for procedure for purposes other than remedying health state, unspecified: Secondary | ICD-10-CM

## 2015-02-04 DIAGNOSIS — Z7951 Long term (current) use of inhaled steroids: Secondary | ICD-10-CM

## 2015-02-04 DIAGNOSIS — Z85828 Personal history of other malignant neoplasm of skin: Secondary | ICD-10-CM | POA: Diagnosis not present

## 2015-02-04 DIAGNOSIS — M542 Cervicalgia: Secondary | ICD-10-CM | POA: Diagnosis present

## 2015-02-04 DIAGNOSIS — M47812 Spondylosis without myelopathy or radiculopathy, cervical region: Secondary | ICD-10-CM | POA: Diagnosis present

## 2015-02-04 HISTORY — PX: ANTERIOR CERVICAL DECOMP/DISCECTOMY FUSION: SHX1161

## 2015-02-04 SURGERY — ANTERIOR CERVICAL DECOMPRESSION/DISCECTOMY FUSION 2 LEVELS
Anesthesia: General

## 2015-02-04 MED ORDER — LOSARTAN POTASSIUM 50 MG PO TABS
50.0000 mg | ORAL_TABLET | Freq: Two times a day (BID) | ORAL | Status: DC
  Administered 2015-02-04 – 2015-02-05 (×2): 50 mg via ORAL
  Filled 2015-02-04 (×3): qty 1

## 2015-02-04 MED ORDER — EPHEDRINE SULFATE 50 MG/ML IJ SOLN
INTRAMUSCULAR | Status: DC | PRN
  Administered 2015-02-04: 10 mg via INTRAVENOUS

## 2015-02-04 MED ORDER — EPHEDRINE SULFATE 50 MG/ML IJ SOLN
INTRAMUSCULAR | Status: AC
  Filled 2015-02-04: qty 1

## 2015-02-04 MED ORDER — LIDOCAINE HCL (CARDIAC) 20 MG/ML IV SOLN
INTRAVENOUS | Status: DC | PRN
  Administered 2015-02-04: 75 mg via INTRAVENOUS

## 2015-02-04 MED ORDER — 0.9 % SODIUM CHLORIDE (POUR BTL) OPTIME
TOPICAL | Status: DC | PRN
  Administered 2015-02-04: 1000 mL

## 2015-02-04 MED ORDER — LACTATED RINGERS IV SOLN
INTRAVENOUS | Status: DC
  Administered 2015-02-04: 08:00:00 via INTRAVENOUS

## 2015-02-04 MED ORDER — DEXAMETHASONE SODIUM PHOSPHATE 4 MG/ML IJ SOLN
4.0000 mg | Freq: Four times a day (QID) | INTRAMUSCULAR | Status: AC
  Administered 2015-02-04 – 2015-02-05 (×2): 4 mg via INTRAVENOUS
  Filled 2015-02-04: qty 1

## 2015-02-04 MED ORDER — CEFAZOLIN SODIUM-DEXTROSE 2-3 GM-% IV SOLR
2.0000 g | Freq: Three times a day (TID) | INTRAVENOUS | Status: AC
  Administered 2015-02-04 – 2015-02-05 (×2): 2 g via INTRAVENOUS
  Filled 2015-02-04 (×2): qty 50

## 2015-02-04 MED ORDER — LOSARTAN POTASSIUM-HCTZ 50-12.5 MG PO TABS: 1.0000 | ORAL_TABLET | Freq: Two times a day (BID) | ORAL | Status: DC

## 2015-02-04 MED ORDER — PROPOFOL 10 MG/ML IV BOLUS
INTRAVENOUS | Status: AC
  Filled 2015-02-04: qty 20

## 2015-02-04 MED ORDER — ACETAMINOPHEN 325 MG PO TABS: 650.0000 mg | ORAL_TABLET | ORAL | Status: DC | PRN

## 2015-02-04 MED ORDER — HYDROMORPHONE HCL 1 MG/ML IJ SOLN
0.2500 mg | INTRAMUSCULAR | Status: DC | PRN
  Administered 2015-02-04 (×3): 0.5 mg via INTRAVENOUS

## 2015-02-04 MED ORDER — HYDROCHLOROTHIAZIDE 12.5 MG PO CAPS
12.5000 mg | ORAL_CAPSULE | Freq: Two times a day (BID) | ORAL | Status: DC
  Administered 2015-02-04 – 2015-02-05 (×2): 12.5 mg via ORAL
  Filled 2015-02-04 (×3): qty 1

## 2015-02-04 MED ORDER — FENTANYL CITRATE 0.05 MG/ML IJ SOLN
INTRAMUSCULAR | Status: AC
  Filled 2015-02-04: qty 5

## 2015-02-04 MED ORDER — LABETALOL HCL 100 MG PO TABS
100.0000 mg | ORAL_TABLET | Freq: Two times a day (BID) | ORAL | Status: DC
  Administered 2015-02-04 – 2015-02-05 (×2): 100 mg via ORAL
  Filled 2015-02-04 (×3): qty 1

## 2015-02-04 MED ORDER — AMLODIPINE BESYLATE 5 MG PO TABS
5.0000 mg | ORAL_TABLET | Freq: Every day | ORAL | Status: DC
  Administered 2015-02-05: 5 mg via ORAL
  Filled 2015-02-04: qty 1

## 2015-02-04 MED ORDER — MENTHOL 3 MG MT LOZG
1.0000 | LOZENGE | OROMUCOSAL | Status: DC | PRN
Start: 2015-02-04 — End: 2015-02-05

## 2015-02-04 MED ORDER — THROMBIN 5000 UNITS EX SOLR
CUTANEOUS | Status: DC | PRN
  Administered 2015-02-04 (×2): 5000 [IU] via TOPICAL

## 2015-02-04 MED ORDER — ONDANSETRON HCL 4 MG/2ML IJ SOLN
INTRAMUSCULAR | Status: AC
  Filled 2015-02-04: qty 2

## 2015-02-04 MED ORDER — SODIUM CHLORIDE 0.9 % IR SOLN
Status: DC | PRN
  Administered 2015-02-04: 10:00:00

## 2015-02-04 MED ORDER — DEXAMETHASONE 4 MG PO TABS: 4.0000 mg | ORAL_TABLET | Freq: Four times a day (QID) | ORAL | Status: AC

## 2015-02-04 MED ORDER — LIDOCAINE HCL (CARDIAC) 20 MG/ML IV SOLN
INTRAVENOUS | Status: AC
  Filled 2015-02-04: qty 10

## 2015-02-04 MED ORDER — PHENYLEPHRINE 40 MCG/ML (10ML) SYRINGE FOR IV PUSH (FOR BLOOD PRESSURE SUPPORT)
PREFILLED_SYRINGE | INTRAVENOUS | Status: AC
  Filled 2015-02-04: qty 10

## 2015-02-04 MED ORDER — DEXAMETHASONE SODIUM PHOSPHATE 4 MG/ML IJ SOLN
INTRAMUSCULAR | Status: AC
  Filled 2015-02-04: qty 1

## 2015-02-04 MED ORDER — HYDROCODONE-ACETAMINOPHEN 5-325 MG PO TABS
1.0000 | ORAL_TABLET | ORAL | Status: DC | PRN
  Administered 2015-02-04 (×2): 2 via ORAL
  Filled 2015-02-04 (×2): qty 2

## 2015-02-04 MED ORDER — THROMBIN 5000 UNITS EX SOLR
CUTANEOUS | Status: DC | PRN
  Administered 2015-02-04: 11:00:00 via TOPICAL

## 2015-02-04 MED ORDER — ACETAMINOPHEN 650 MG RE SUPP: 650.0000 mg | RECTAL | Status: DC | PRN

## 2015-02-04 MED ORDER — SUCCINYLCHOLINE CHLORIDE 20 MG/ML IJ SOLN
INTRAMUSCULAR | Status: AC
  Filled 2015-02-04: qty 1

## 2015-02-04 MED ORDER — HYDROMORPHONE HCL 1 MG/ML IJ SOLN
INTRAMUSCULAR | Status: AC
  Filled 2015-02-04: qty 1

## 2015-02-04 MED ORDER — ROCURONIUM BROMIDE 100 MG/10ML IV SOLN
INTRAVENOUS | Status: DC | PRN
  Administered 2015-02-04 (×2): 10 mg via INTRAVENOUS
  Administered 2015-02-04: 30 mg via INTRAVENOUS

## 2015-02-04 MED ORDER — SODIUM CHLORIDE 0.9 % IJ SOLN
3.0000 mL | Freq: Two times a day (BID) | INTRAMUSCULAR | Status: DC
  Administered 2015-02-04 (×2): 3 mL via INTRAVENOUS

## 2015-02-04 MED ORDER — LACTATED RINGERS IV SOLN
INTRAVENOUS | Status: DC | PRN
  Administered 2015-02-04 (×2): via INTRAVENOUS

## 2015-02-04 MED ORDER — HYDROMORPHONE HCL 1 MG/ML IJ SOLN: 1.0000 mg | INTRAMUSCULAR | Status: DC | PRN

## 2015-02-04 MED ORDER — PROPOFOL 10 MG/ML IV BOLUS
INTRAVENOUS | Status: DC | PRN
  Administered 2015-02-04: 200 mg via INTRAVENOUS

## 2015-02-04 MED ORDER — DEXAMETHASONE SODIUM PHOSPHATE 10 MG/ML IJ SOLN
INTRAMUSCULAR | Status: AC
  Filled 2015-02-04: qty 1

## 2015-02-04 MED ORDER — ONDANSETRON HCL 4 MG/2ML IJ SOLN
INTRAMUSCULAR | Status: DC | PRN
  Administered 2015-02-04: 4 mg via INTRAVENOUS

## 2015-02-04 MED ORDER — NEOSTIGMINE METHYLSULFATE 10 MG/10ML IV SOLN
INTRAVENOUS | Status: AC
  Filled 2015-02-04: qty 1

## 2015-02-04 MED ORDER — HEMOSTATIC AGENTS (NO CHARGE) OPTIME
TOPICAL | Status: DC | PRN
  Administered 2015-02-04: 1 via TOPICAL

## 2015-02-04 MED ORDER — ROCURONIUM BROMIDE 50 MG/5ML IV SOLN
INTRAVENOUS | Status: AC
  Filled 2015-02-04: qty 2

## 2015-02-04 MED ORDER — MIDAZOLAM HCL 5 MG/5ML IJ SOLN
INTRAMUSCULAR | Status: DC | PRN
  Administered 2015-02-04: 1 mg via INTRAVENOUS

## 2015-02-04 MED ORDER — PROMETHAZINE HCL 25 MG/ML IJ SOLN: 6.2500 mg | INTRAMUSCULAR | Status: DC | PRN

## 2015-02-04 MED ORDER — GLYCOPYRROLATE 0.2 MG/ML IJ SOLN
INTRAMUSCULAR | Status: DC | PRN
  Administered 2015-02-04: .7 mg via INTRAVENOUS

## 2015-02-04 MED ORDER — ONDANSETRON HCL 4 MG/2ML IJ SOLN
4.0000 mg | INTRAMUSCULAR | Status: DC | PRN
Start: 2015-02-04 — End: 2015-02-05

## 2015-02-04 MED ORDER — NEOSTIGMINE METHYLSULFATE 10 MG/10ML IV SOLN
INTRAVENOUS | Status: DC | PRN
  Administered 2015-02-04: 4 mg via INTRAVENOUS

## 2015-02-04 MED ORDER — PHENOL 1.4 % MT LIQD: 1.0000 | OROMUCOSAL | Status: DC | PRN

## 2015-02-04 MED ORDER — LIDOCAINE HCL (CARDIAC) 20 MG/ML IV SOLN: INTRAVENOUS | Status: DC | PRN

## 2015-02-04 MED ORDER — SENNOSIDES-DOCUSATE SODIUM 8.6-50 MG PO TABS
1.0000 | ORAL_TABLET | Freq: Every evening | ORAL | Status: DC | PRN
  Filled 2015-02-04: qty 1

## 2015-02-04 MED ORDER — FENTANYL CITRATE 0.05 MG/ML IJ SOLN: INTRAMUSCULAR | Status: DC | PRN

## 2015-02-04 MED ORDER — STERILE WATER FOR INJECTION IJ SOLN
INTRAMUSCULAR | Status: AC
  Filled 2015-02-04: qty 10

## 2015-02-04 MED ORDER — SODIUM CHLORIDE 0.9 % IJ SOLN: 3.0000 mL | INTRAMUSCULAR | Status: DC | PRN

## 2015-02-04 MED ORDER — SUCCINYLCHOLINE CHLORIDE 20 MG/ML IJ SOLN
INTRAMUSCULAR | Status: DC | PRN
  Administered 2015-02-04: 120 mg via INTRAVENOUS

## 2015-02-04 MED ORDER — MIDAZOLAM HCL 5 MG/5ML IJ SOLN: INTRAMUSCULAR | Status: DC | PRN

## 2015-02-04 MED ORDER — PANTOPRAZOLE SODIUM 40 MG IV SOLR
40.0000 mg | Freq: Every day | INTRAVENOUS | Status: DC
  Filled 2015-02-04 (×2): qty 40

## 2015-02-04 MED ORDER — MIDAZOLAM HCL 2 MG/2ML IJ SOLN
INTRAMUSCULAR | Status: AC
  Filled 2015-02-04: qty 2

## 2015-02-04 MED ORDER — CYCLOBENZAPRINE HCL 10 MG PO TABS
ORAL_TABLET | ORAL | Status: AC
  Filled 2015-02-04: qty 1

## 2015-02-04 MED ORDER — KCL IN DEXTROSE-NACL 20-5-0.45 MEQ/L-%-% IV SOLN
80.0000 mL/h | INTRAVENOUS | Status: DC
  Filled 2015-02-04 (×3): qty 1000

## 2015-02-04 MED ORDER — FENTANYL CITRATE 0.05 MG/ML IJ SOLN
INTRAMUSCULAR | Status: AC
  Filled 2015-02-04: qty 2

## 2015-02-04 MED ORDER — GABAPENTIN 300 MG PO CAPS
300.0000 mg | ORAL_CAPSULE | Freq: Two times a day (BID) | ORAL | Status: DC
Start: 2015-02-04 — End: 2015-02-05
  Administered 2015-02-04 – 2015-02-05 (×2): 300 mg via ORAL
  Filled 2015-02-04 (×3): qty 1

## 2015-02-04 MED ORDER — FENTANYL CITRATE 0.05 MG/ML IJ SOLN
INTRAMUSCULAR | Status: DC | PRN
  Administered 2015-02-04: 150 ug via INTRAVENOUS
  Administered 2015-02-04: 100 ug via INTRAVENOUS

## 2015-02-04 MED ORDER — GLYCOPYRROLATE 0.2 MG/ML IJ SOLN
INTRAMUSCULAR | Status: AC
  Filled 2015-02-04: qty 4

## 2015-02-04 MED ORDER — CYCLOBENZAPRINE HCL 10 MG PO TABS
10.0000 mg | ORAL_TABLET | Freq: Three times a day (TID) | ORAL | Status: DC | PRN
  Administered 2015-02-04: 10 mg via ORAL

## 2015-02-04 MED ORDER — PROPOFOL 10 MG/ML IV BOLUS: INTRAVENOUS | Status: DC | PRN

## 2015-02-04 MED ORDER — ARTIFICIAL TEARS OP OINT
TOPICAL_OINTMENT | OPHTHALMIC | Status: AC
  Filled 2015-02-04: qty 3.5

## 2015-02-04 SURGICAL SUPPLY — 57 items
BAG DECANTER FOR FLEXI CONT (MISCELLANEOUS) ×2 IMPLANT
BENZOIN TINCTURE PRP APPL 2/3 (GAUZE/BANDAGES/DRESSINGS) ×2 IMPLANT
BRUSH SCRUB EZ PLAIN DRY (MISCELLANEOUS) ×2 IMPLANT
BUR MATCHSTICK NEURO 3.0 LAGG (BURR) ×2 IMPLANT
CANISTER SUCT 3000ML PPV (MISCELLANEOUS) ×2 IMPLANT
CONT SPEC 4OZ CLIKSEAL STRL BL (MISCELLANEOUS) ×2 IMPLANT
DRAPE C-ARM 42X72 X-RAY (DRAPES) ×4 IMPLANT
DRAPE LAPAROTOMY 100X72 PEDS (DRAPES) ×2 IMPLANT
DRAPE MICROSCOPE LEICA (MISCELLANEOUS) ×2 IMPLANT
DRAPE SURG 17X23 STRL (DRAPES) ×4 IMPLANT
DRSG OPSITE POSTOP 4X6 (GAUZE/BANDAGES/DRESSINGS) ×2 IMPLANT
DRSG TELFA 3X8 NADH (GAUZE/BANDAGES/DRESSINGS) ×2 IMPLANT
DURAPREP 6ML APPLICATOR 50/CS (WOUND CARE) ×2 IMPLANT
ELECT COATED BLADE 2.86 ST (ELECTRODE) ×2 IMPLANT
ELECT REM PT RETURN 9FT ADLT (ELECTROSURGICAL) ×2
ELECTRODE REM PT RTRN 9FT ADLT (ELECTROSURGICAL) ×1 IMPLANT
GAUZE SPONGE 4X4 12PLY STRL (GAUZE/BANDAGES/DRESSINGS) ×2 IMPLANT
GAUZE SPONGE 4X4 16PLY XRAY LF (GAUZE/BANDAGES/DRESSINGS) IMPLANT
GLOVE BIO SURGEON STRL SZ8 (GLOVE) ×2 IMPLANT
GLOVE ECLIPSE 7.5 STRL STRAW (GLOVE) ×4 IMPLANT
GLOVE ECLIPSE 8.0 STRL XLNG CF (GLOVE) ×2 IMPLANT
GLOVE EXAM NITRILE LRG STRL (GLOVE) IMPLANT
GLOVE EXAM NITRILE MD LF STRL (GLOVE) ×2 IMPLANT
GLOVE EXAM NITRILE XL STR (GLOVE) IMPLANT
GLOVE EXAM NITRILE XS STR PU (GLOVE) IMPLANT
GLOVE INDICATOR 7.5 STRL GRN (GLOVE) ×2 IMPLANT
GLOVE INDICATOR 8.0 STRL GRN (GLOVE) ×4 IMPLANT
GOWN STRL REUS W/ TWL LRG LVL3 (GOWN DISPOSABLE) ×1 IMPLANT
GOWN STRL REUS W/ TWL XL LVL3 (GOWN DISPOSABLE) ×1 IMPLANT
GOWN STRL REUS W/TWL 2XL LVL3 (GOWN DISPOSABLE) ×4 IMPLANT
GOWN STRL REUS W/TWL LRG LVL3 (GOWN DISPOSABLE) ×1
GOWN STRL REUS W/TWL XL LVL3 (GOWN DISPOSABLE) ×1
HALTER HD/CHIN CERV TRACTION D (MISCELLANEOUS) ×2 IMPLANT
HEMOSTAT POWDER SURGIFOAM 1G (HEMOSTASIS) ×2 IMPLANT
INTERBODY TM 11X14X5-7DEG ANG (Metal Cage) ×2 IMPLANT
KIT BASIN OR (CUSTOM PROCEDURE TRAY) ×2 IMPLANT
KIT ROOM TURNOVER OR (KITS) ×2 IMPLANT
NEEDLE SPNL 20GX3.5 QUINCKE YW (NEEDLE) ×2 IMPLANT
NS IRRIG 1000ML POUR BTL (IV SOLUTION) ×2 IMPLANT
PACK LAMINECTOMY NEURO (CUSTOM PROCEDURE TRAY) ×2 IMPLANT
PAD ARMBOARD 7.5X6 YLW CONV (MISCELLANEOUS) ×2 IMPLANT
PATTIES SURGICAL .75X.75 (GAUZE/BANDAGES/DRESSINGS) ×2 IMPLANT
PLATE 40MM (Plate) ×2 IMPLANT
PUTTY BONE GRAFT KIT 2.5ML (Bone Implant) ×2 IMPLANT
RUBBERBAND STERILE (MISCELLANEOUS) ×4 IMPLANT
SCREW SELF DRILL FIXED 14MM (Screw) ×12 IMPLANT
SPACER TMS 11X14X6MM (Spacer) ×2 IMPLANT
SPONGE INTESTINAL PEANUT (DISPOSABLE) ×2 IMPLANT
SPONGE SURGIFOAM ABS GEL SZ50 (HEMOSTASIS) ×2 IMPLANT
STRIP CLOSURE SKIN 1/2X4 (GAUZE/BANDAGES/DRESSINGS) ×2 IMPLANT
SUT PDS AB 5-0 P3 18 (SUTURE) ×2 IMPLANT
SUT VIC AB 3-0 CP2 18 (SUTURE) ×4 IMPLANT
SYR 20ML ECCENTRIC (SYRINGE) ×2 IMPLANT
TOWEL OR 17X24 6PK STRL BLUE (TOWEL DISPOSABLE) ×2 IMPLANT
TOWEL OR 17X26 10 PK STRL BLUE (TOWEL DISPOSABLE) ×2 IMPLANT
TRAP SPECIMEN MUCOUS 40CC (MISCELLANEOUS) ×2 IMPLANT
WATER STERILE IRR 1000ML POUR (IV SOLUTION) ×2 IMPLANT

## 2015-02-04 NOTE — Anesthesia Preprocedure Evaluation (Signed)
Anesthesia Evaluation  Patient identified by MRN, date of birth, ID band Patient awake    Reviewed: Allergy & Precautions, NPO status , Patient's Chart, lab work & pertinent test results  Airway Mallampati: II  TM Distance: >3 FB Neck ROM: Full    Dental   Pulmonary pneumonia -, former smoker,  breath sounds clear to auscultation        Cardiovascular hypertension, + dysrhythmias Rhythm:Regular Rate:Normal     Neuro/Psych    GI/Hepatic Neg liver ROS, hiatal hernia,   Endo/Other  negative endocrine ROS  Renal/GU negative Renal ROS     Musculoskeletal   Abdominal   Peds  Hematology   Anesthesia Other Findings   Reproductive/Obstetrics                             Anesthesia Physical Anesthesia Plan  ASA: III  Anesthesia Plan: General   Post-op Pain Management:    Induction: Intravenous  Airway Management Planned: Oral ETT  Additional Equipment:   Intra-op Plan:   Post-operative Plan: Extubation in OR  Informed Consent: I have reviewed the patients History and Physical, chart, labs and discussed the procedure including the risks, benefits and alternatives for the proposed anesthesia with the patient or authorized representative who has indicated his/her understanding and acceptance.   Dental advisory given  Plan Discussed with: CRNA and Anesthesiologist  Anesthesia Plan Comments:         Anesthesia Quick Evaluation

## 2015-02-04 NOTE — Anesthesia Postprocedure Evaluation (Signed)
  Anesthesia Post-op Note  Patient: Jose [name]  Procedure(s) Performed: Procedure(s): ANTERIOR CERVICAL DECOMPRESSION/DISCECTOMY FUSION CERVICAL FIVE-SIX,CERVICAL SIX-SEVEN (N/A)  Patient Location: PACU  Anesthesia Type:General  Level of Consciousness: awake  Airway and Oxygen Therapy: Patient Spontanous Breathing  Post-op Pain: mild  Post-op Assessment: Post-op Vital signs reviewed  Post-op Vital Signs: Reviewed  Last Vitals:  Filed Vitals:   02/04/15 1330  BP: 131/77  Pulse: 81  Temp:   Resp: 18    Complications: No apparent anesthesia complications

## 2015-02-04 NOTE — Op Note (Signed)
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 Using fluoroscopy localization was carried out the transverse incision was then made in the right anterior neck started the midline and headed towards the medial aspect of the sternocleidomastoid muscle. The platysma muscle was then incised transversely and the natural fascial plane between the strap muscles medially and the sternocleidomastoid muscle laterally was identified and followed down to the anterior aspect the cervical spine. The longus coli muscles were identified split in the midline and Triple-A bilaterally with unipolar coagulation and Kitner dissection. Self-retaining retractor was placed for exposure and x-ray showed approach to the appropriate levels. Using a 15 blade the [place] disc at C5-6 and C6-7 was incised. Using pituitaries years and curettes approximately 90% of the disc material both levels was removed. High-speed drill was used to widen the interspace and bony shavings were saved for use later in the case. At this time the microscope was draped brought in the field and used for the remainder of the case. Starting C6-7 the remainder of the disc material down the posterior longitudinal ligament was removed. Ligament was then incised transversely and the cut edges were removed. Thorough decompression was carried out on the spinal dura into the foramen bilaterally until the C7 nerve roots well visualized well decompressed bilaterally. Attention was then turned to C5-6 were similar decompression was carried out. Once again thorough decompression was carried out on the spinal dura into the foramen bilaterally until the C6 nerve  roots well visualized well decompressed. At this time inspection was carried out once more both levels for any evidence of residual compression and none could be identified. Irrigation was carried out and any bleeding control proper coagulation Gelfoam and Surgifoam. Measurements were taken and one 5 and one 6 mm lordotic trabecular metal graft was chosen and filled with a mixture of autologous bone and morselized allograft. I've millimeter graft was impacted C5-6 and a 6 mm graft impacted C6-7. Ferocity showed them to be in good position. An appropriately length Trinica anterior cervical plate was then chosen. Under fluoroscopic guidance 6 14 mm screws were placed without difficulty and then the locking mechanism was rotated locked position. Final fluoroscopy showed good position of the plates screws and plugs. Irrigation was carried out and any bleeding control proper coagulation. The was then closed with inverted Vicryl on the platysma muscle and subcuticular layer. Steri-Strips were placed on the skin. Shortness was then applied and the patient was extubated and taken to recovery in stable condition.

## 2015-02-04 NOTE — Plan of Care (Signed)
Problem: Consults Goal: Diagnosis - Spinal Surgery Outcome: Completed/Met Date Met:  02/04/15 Cervical Spine Fusion

## 2015-02-04 NOTE — Transfer of Care (Signed)
Immediate Anesthesia Transfer of Care Note  Patient: Jose [name].  Procedure(s) Performed: Procedure(s): ANTERIOR CERVICAL DECOMPRESSION/DISCECTOMY FUSION CERVICAL FIVE-SIX,CERVICAL SIX-SEVEN (N/A)  Patient Location: PACU  Anesthesia Type:General  Level of Consciousness: sedated and patient cooperative  Airway & Oxygen Therapy: Patient Spontanous Breathing and Patient connected to nasal cannula oxygen  Post-op Assessment: Report given to RN and Post -op Vital signs reviewed and stable  Post vital signs: Reviewed and stable  Last Vitals:  Filed Vitals:   02/04/15 0814  BP: 143/94  Pulse: 52  Temp: 36.5 C  Resp: 18    Complications: No apparent anesthesia complications

## 2015-02-04 NOTE — Progress Notes (Signed)
Utilization review completed.  

## 2015-02-04 NOTE — Anesthesia Procedure Notes (Signed)
Procedure Name: Intubation Date/Time: 02/04/2015 10:12 AM Performed by: Eligha Bridegroom Pre-anesthesia Checklist: Patient identified, Timeout performed, Emergency Drugs available, Suction available and Patient being monitored Patient Re-evaluated:Patient Re-evaluated prior to inductionOxygen Delivery Method: Circle system utilized Preoxygenation: Pre-oxygenation with 100% oxygen Intubation Type: IV induction Laryngoscope Size: Glidescope and 3 Grade View: Grade III Tube type: Oral Tube size: 7.5 mm Number of attempts: 1 Airway Equipment and Method: Stylet,  Video-laryngoscopy and LTA kit utilized Placement Confirmation: ETT inserted through vocal cords under direct vision,  breath sounds checked- equal and bilateral and positive ETCO2 Secured at: 22 cm Tube secured with: Tape Dental Injury: Teeth and Oropharynx as per pre-operative assessment

## 2015-02-04 NOTE — H&P (Signed)
Jose [name]. is an 79 y.o. male.   Chief Complaint: Neck pain into the left arm HPI: The patient is a 79 year old gentleman who is evaluated in the office for neck pain with radiation into the left arm. He tried a great tool conservative therapy without improvement and then underwent imaging studies. He was referred for evaluation. After evaluation the office the patient's MRI scan was reviewed. It showed multiple levels of disease at C2-3 C3-4 C5-6 and C6-7. C4-5 was not involved. His pain radiated from his neck all the way down the arm and O's felt that the lower levels were most likely the symptomatic levels at C5-6 and C6-7 and was elected after discussing the options to proceed with a two-level anterior cervical discectomy with fusion and plating. I've had a long discussion with him regarding the risks and benefits of surgical intervention. The risks discussed include but are not limited to bleeding infection weakness some as paralysis spinal fluid leak coma quadriplegia hoarseness and death. We have discussed alternative methods of therapy along with the risks and benefits of nonintervention. [name] is had the opportunity to ask numerous questions and appears to understand. With this information in hand he has requested to proceed with surgery.  Past Medical History  Diagnosis Date  . History of CT scan of head 1997    small vessel disease  . Gallstones 07/1996  . History of ETT 05/2002     no EKG changes  . History of MRI of cervical spine 07/15/2002  . Rectal bleeding 12/2002    colonoscopy  . Hypertension   . MGUS (monoclonal gammopathy of unknown significance) 04/11/2012  . Skin cancer   . Seizures     last seizure 1976  . TIA (transient ischemic attack)   . Peripheral neuropathy   . 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 Stroke     ?tia ?afib  . Dysrhythmia     afib  . Pneumonia 10/2014  . History of hiatal hernia     3 over period 6 yrs, last 41  studies no dx,  no rx  . Arthritis   . Sleep apnea     ? to have study after surgery    Past Surgical History  Procedure Laterality Date  . Skin cancer excision  07/1993    left lateral arm  . Keratosis excision Right 05/1998    wrist  . Ulnar nerve transfer  12/1999    left  . Colonoscopy w/ polypectomy  04/19/2003    tubular adenoma  . Shoulder injection  June 2012    left, [place]  . Thumb injection  June 2012    left, [place]  . Great toe arthrodesis, interphalangeal joint Left 1978  . Tonsillectomy    . Adenoidectomy    . Cataract extraction Bilateral   . Shoulder arthroscopy Left 15    rotator cuff     Family History  Problem Relation Age of Onset  . Dementia Mother 16    vascular  . Transient ischemic attack Mother   . Dementia Father     after hip fracture  . Hypertension Sister     skin cancer  . Seizures Neg Hx    Social History:  reports that he quit smoking about 46 years ago. He has never used smokeless tobacco. He reports that he drinks about 0.5 oz of alcohol per week. He reports that he does not use illicit drugs.  Allergies:  Allergies  Allergen Reactions  . Tetracycline Hcl Rash    Medications Prior to Admission  Medication Sig Dispense Refill  . amLODipine (NORVASC) 5 MG tablet Take 5 mg by mouth daily.    Marland Kitchen apixaban (ELIQUIS) 5 MG TABS tablet Take 1 tablet (5 mg total) by mouth 2 (two) times daily. 180 tablet 1  . fluticasone (FLONASE) 50 MCG/ACT nasal spray Place 1 spray into both nostrils daily as needed for allergies.     Marland Kitchen gabapentin (NEURONTIN) 300 MG capsule Take 300 mg by mouth 2 (two) times daily.    Marland Kitchen labetalol (NORMODYNE) 200 MG tablet Take 100 mg by mouth 2 (two) times daily.    Marland Kitchen losartan-hydrochlorothiazide (HYZAAR) 50-12.5 MG per tablet Take 1 tablet by mouth 2 (two) times daily.    . Multiple Vitamins-Minerals (CENTRUM SILVER PO) Take 1 tablet by mouth daily.     . Multiple Vitamins-Minerals (PRESERVISION AREDS 2) CAPS Take 2 capsules by mouth  daily.    . Omega-3 Fatty Acids (FISH OIL) 1000 MG CAPS Take 2,000 mg by mouth daily.    . Red Yeast Rice Extract (RED YEAST RICE PO) Take 2 tablets by mouth daily.    . predniSONE (DELTASONE) 10 MG tablet Begin taking 6 tablets daily, taper by one tablet every other day until off the medication. (Patient not taking: Reported on 01/27/2015) 42 tablet 0  . traMADol (ULTRAM) 50 MG tablet Take 1 tablet (50 mg total) by mouth every 6 (six) hours as needed. (Patient not taking: Reported on 01/27/2015) 60 tablet 1    Results for orders placed or performed during the hospital encounter of 02/02/15 (from the past 48 hour(s))  Surgical pcr screen     Status: None   Collection Time: 02/02/15  3:40 PM  Result Value Ref Range   MRSA, PCR NEGATIVE NEGATIVE   Staphylococcus aureus NEGATIVE NEGATIVE    Comment:        The Xpert SA Assay (FDA approved for NASAL specimens in patients over 20 years of age), is one component of a comprehensive surveillance program.  Test performance has been validated by [facility] for patients greater than or equal to 82 year old. It is not intended to diagnose infection nor to guide or monitor treatment.   Basic metabolic panel     Status: Abnormal   Collection Time: 02/02/15  3:41 PM  Result Value Ref Range   Sodium 141 135 - 145 mmol/L   Potassium 3.4 (L) 3.5 - 5.1 mmol/L   Chloride 100 96 - 112 mmol/L   CO2 31 19 - 32 mmol/L   Glucose, Bld 130 (H) 70 - 99 mg/dL   BUN 18 6 - 23 mg/dL   Creatinine, Ser 1.02 0.50 - 1.35 mg/dL   Calcium 9.1 8.4 - 10.5 mg/dL   GFR calc non Af Amer 69 (L) >90 mL/min   GFR calc Af Amer 80 (L) >90 mL/min    Comment: (NOTE) The eGFR has been calculated using the CKD EPI equation. This calculation has not been validated in all clinical situations. eGFR's persistently <90 mL/min signify possible Chronic Kidney Disease.    Anion gap 10 5 - 15  CBC     Status: Abnormal   Collection Time: 02/02/15  3:41 PM  Result Value Ref Range    WBC 5.1 4.0 - 10.5 K/uL   RBC 4.13 (L) 4.22 - 5.81 MIL/uL   Hemoglobin 13.3 13.0 - 17.0 g/dL   HCT 38.5 (L) 39.0 - 52.0 %  MCV 93.2 78.0 - 100.0 fL   MCH 32.2 26.0 - 34.0 pg   MCHC 34.5 30.0 - 36.0 g/dL   RDW 13.4 11.5 - 15.5 %   Platelets 200 150 - 400 K/uL   No results found.  Review of systems not obtained due to patient factors.  Blood pressure 143/94, pulse 52, temperature 97.7 F (36.5 C), resp. rate 18, SpO2 98 %.  The patient is awake or and oriented. His gait is nonantalgic. His no facial asymmetry. Strength is intact except for some mild weakness on the left upper extremity Assessment/Plan Impression is that of spondylosis and disc herniation with stenosis at C5-6 and C6-7. The plan is for a two-level anterior cervical discectomy with fusion and plating.  Faythe Ghee, MD 02/04/2015, 9:51 AM

## 2015-02-05 MED ORDER — HYDROCODONE-ACETAMINOPHEN 5-325 MG PO TABS: 1.0000 | ORAL_TABLET | Freq: Four times a day (QID) | ORAL | Status: DC | PRN

## 2015-02-05 NOTE — Discharge Summary (Signed)
Physician Discharge Summary  Patient ID: Jose [name]. MRN: [id number] DOB/AGE: 77-Jun-1937 77 y.o.  Admit date: 02/04/2015 Discharge date: 02/05/2015  Admission Diagnoses: Cervical spondylosis   Discharge Diagnoses: Same   Discharged Condition: good  Hospital Course: The patient was admitted on 02/04/2015 and taken to the operating room where the patient underwent ACDF. The patient tolerated the procedure well and was taken to the recovery room and then to the floor in stable condition. The hospital course was routine. There were no complications. The wound remained clean dry and intact. Pt had appropriate neck soreness. No complaints of arm pain or new N/T/W. The patient remained afebrile with stable vital signs, and tolerated a regular diet. The patient continued to increase activities, and pain was well controlled with oral pain medications.   Consults: None  Significant Diagnostic Studies:  Results for orders placed or performed during the hospital encounter of 02/02/15  Surgical pcr screen  Result Value Ref Range   MRSA, PCR NEGATIVE NEGATIVE   Staphylococcus aureus NEGATIVE NEGATIVE  Basic metabolic panel  Result Value Ref Range   Sodium 141 135 - 145 mmol/L   Potassium 3.4 (L) 3.5 - 5.1 mmol/L   Chloride 100 96 - 112 mmol/L   CO2 31 19 - 32 mmol/L   Glucose, Bld 130 (H) 70 - 99 mg/dL   BUN 18 6 - 23 mg/dL   Creatinine, Ser 1.02 0.50 - 1.35 mg/dL   Calcium 9.1 8.4 - 10.5 mg/dL   GFR calc non Af Amer 69 (L) >90 mL/min   GFR calc Af Amer 80 (L) >90 mL/min   Anion gap 10 5 - 15  CBC  Result Value Ref Range   WBC 5.1 4.0 - 10.5 K/uL   RBC 4.13 (L) 4.22 - 5.81 MIL/uL   Hemoglobin 13.3 13.0 - 17.0 g/dL   HCT 38.5 (L) 39.0 - 52.0 %   MCV 93.2 78.0 - 100.0 fL   MCH 32.2 26.0 - 34.0 pg   MCHC 34.5 30.0 - 36.0 g/dL   RDW 13.4 11.5 - 15.5 %   Platelets 200 150 - 400 K/uL    Dg Cervical Spine 1 View  02/04/2015   CLINICAL DATA:  C5-6 and C6-7 ACDF  EXAM: DG CERVICAL SPINE  - 1 VIEW; DG C-ARM 61-120 MIN  COMPARISON:  None.  FINDINGS: Fluoro time reported is 0 minutes, 14 seconds, 3 fluoro spot films were obtained.  The initial image reveals the anterior fusion plate as well as intradiscal device beginning at C5 but its inferior extent X is excluded from the study. The subsequent image obtained lower reveals the fusion device in an inter discal device at C5-6 and C6-7. The trachea is intubated.  IMPRESSION: Three intra operative will fluoro spot images in the lateral plane revealing ACDF at C5-6 and C6-7. There is no immediate complication observed.   Electronically Signed   By: Illya Gienger  [place]   On: 02/04/2015 12:39   Ct Chest Wo Contrast  01/14/2015   CLINICAL DATA:  Followup pneumonia.  Evaluate for resolution.  EXAM: CT CHEST WITHOUT CONTRAST  TECHNIQUE: Multidetector CT imaging of the chest was performed following the standard protocol without IV contrast.  COMPARISON:  11/25/2014  FINDINGS: CT CHEST FINDINGS  Mediastinum/Nodes: No mediastinal, hilar, or axillary adenopathy. Heart is normal size. Aorta is normal caliber. Scattered coronary artery calcifications.  Lungs/Pleura: Lungs are clear. No focal airspace opacities or suspicious nodules. No effusions. Previously seen left lower lobe airspace opacity has resolved.  Musculoskeletal: Chest wall soft tissues are unremarkable. No acute bony abnormality or focal bone lesion.  Upper abdomen: Imaging into the upper abdomen shows no acute findings.  IMPRESSION: Resolution of the previously seen left lower lobe consolidation. No focal opacity currently.  Coronary artery disease.   Electronically Signed   By: Rolm Baptise M.D.   On: 01/14/2015 16:57   Ct Cervical Spine W Contrast  01/24/2015   CLINICAL DATA:  Chronic neck pain. Left arm weakness, worsening over the last year.  FLUOROSCOPY TIME:  1 minutes 18 seconds. Forty-three d Gy cm squared  PROCEDURE: LUMBAR PUNCTURE FOR CERVICAL MYELOGRAM  After thorough discussion of risks  and benefits of the procedure including bleeding, infection, injury to nerves, blood vessels, adjacent structures as well as headache and CSF leak, written and oral informed consent was obtained. Consent was obtained by Dr. Nelson Chimes. We discussed the high likelihood of obtaining a diagnostic study.  Patient was positioned prone on the fluoroscopy table. Local anesthesia was provided with 1% lidocaine without epinephrine after prepped and draped in the usual sterile fashion. Puncture was performed at L2-3 using a 3 1/2 inch 22-gauge spinal needle via left approach. Using a single pass through the dura, the needle was placed within the thecal sac, with return of clear CSF. 10 mL of Omnipaque-300 was injected into the thecal sac, with normal opacification of the nerve roots and cauda equina consistent with free flow within the subarachnoid space. The patient was then moved to the trendelenburg position and contrast flowed into the Cervical spine region.  I personally performed the lumbar puncture and administered the intrathecal contrast. I also personally performed acquisition of the myelogram images.  TECHNIQUE: Contiguous axial images were obtained through the Cervical spine after the intrathecal infusion of infusion. Coronal and sagittal reconstructions were obtained of the axial image sets.  FINDINGS: CERVICAL MYELOGRAM FINDINGS:  There is relative stenosis at the C5-6 level with narrowing of the canal and both foramina. Diminished filling of the C6 root sleeves bilaterally. I think the left C7 nerve root sleeve is somewhat effaced as well. No other prominent root sleeve defects.  CT CERVICAL MYELOGRAM FINDINGS:  The foramen magnum is widely patent. C1-2 shows osteoarthritis at the articulation of the anterior arch in the dens and articulation of the lateral masses on the left. No canal or foraminal stenosis.  C2-3: Bilateral facet degeneration. Mild uncovertebral hypertrophy. Mild foraminal narrowing without  neural compression.  C3-4: Facet arthropathy on the left. Bilateral uncovertebral hypertrophy. No compressive central canal stenosis. Foraminal stenosis bilaterally because of osteophytic encroachment could affect either C4 nerve root.  C4-5: Bony fusion at this level anteriorly and posteriorly. Wide patency of the central canal. Chronic bony foraminal narrowing left more than right, but unlikely to be associated with neural compression given the fusion.  C5-6: Advanced degenerative spondylosis with endplate osteophytes more prominent towards the right. Bilateral facet arthropathy. Canal narrowing with AP diameter of 9 mm. Foraminal stenosis right worse than left. Either C6 nerve root could be compressed at this level, more likely the right based on the morphology.  C6-7: Spondylosis with uncovertebral osteophytes left more than right. Facet degeneration and hypertrophy left more than right. Narrowing of the central canal but no cord compression. Foraminal stenosis on the left that could compress the C7 nerve root.  C7-T1: Facet degeneration left more than right. No central canal stenosis. Mild bony foraminal narrowing on the left without gross compression of the exiting C8 nerve root.  Upper  thoracic region unremarkable.  IMPRESSION: Anterior and posterior bony fusion at the C4-5 level. No likely neural compression at this level. The foramina are chronically narrowed, left more than right, but due to the fusion, nerve compression as unlikely.  C2-3: Foraminal narrowing on the left primarily because of facet osteophytes.  C3-4: Bilateral foraminal stenosis because of uncovertebral osteophytes and facet arthropathy left more than right. Either C4 nerve root could be compressed, more likely the left.  C5-6: Central canal narrowing with AP diameter of 9 mm. Bilateral foraminal stenosis right worse than left because of osteophytic encroachment. Either or both C6 nerve roots could be compressed.  C6-7: Bony foraminal  narrowing on the left could compress the left C7 nerve root.  C7-T1: Left-sided facet arthropathy. Mild foraminal encroachment on the left but without gross neural compression.   Electronically Signed   By: Nelson Chimes M.D.   On: 01/24/2015 16:22   Dg C-arm 61-120 Min  02/04/2015   CLINICAL DATA:  C5-6 and C6-7 ACDF  EXAM: DG CERVICAL SPINE - 1 VIEW; DG C-ARM 61-120 MIN  COMPARISON:  None.  FINDINGS: Fluoro time reported is 0 minutes, 14 seconds, 3 fluoro spot films were obtained.  The initial image reveals the anterior fusion plate as well as intradiscal device beginning at C5 but its inferior extent X is excluded from the study. The subsequent image obtained lower reveals the fusion device in an inter discal device at C5-6 and C6-7. The trachea is intubated.  IMPRESSION: Three intra operative will fluoro spot images in the lateral plane revealing ACDF at C5-6 and C6-7. There is no immediate complication observed.   Electronically Signed   By: Jasemine Nawaz  [place]   On: 02/04/2015 12:39   Dg Myelography Lumbar Inj Cervical  01/24/2015   CLINICAL DATA:  Chronic neck pain. Left arm weakness, worsening over the last year.  FLUOROSCOPY TIME:  1 minutes 18 seconds. Forty-three d Gy cm squared  PROCEDURE: LUMBAR PUNCTURE FOR CERVICAL MYELOGRAM  After thorough discussion of risks and benefits of the procedure including bleeding, infection, injury to nerves, blood vessels, adjacent structures as well as headache and CSF leak, written and oral informed consent was obtained. Consent was obtained by Dr. Nelson Chimes. We discussed the high likelihood of obtaining a diagnostic study.  Patient was positioned prone on the fluoroscopy table. Local anesthesia was provided with 1% lidocaine without epinephrine after prepped and draped in the usual sterile fashion. Puncture was performed at L2-3 using a 3 1/2 inch 22-gauge spinal needle via left approach. Using a single pass through the dura, the needle was placed within the thecal  sac, with return of clear CSF. 10 mL of Omnipaque-300 was injected into the thecal sac, with normal opacification of the nerve roots and cauda equina consistent with free flow within the subarachnoid space. The patient was then moved to the trendelenburg position and contrast flowed into the Cervical spine region.  I personally performed the lumbar puncture and administered the intrathecal contrast. I also personally performed acquisition of the myelogram images.  TECHNIQUE: Contiguous axial images were obtained through the Cervical spine after the intrathecal infusion of infusion. Coronal and sagittal reconstructions were obtained of the axial image sets.  FINDINGS: CERVICAL MYELOGRAM FINDINGS:  There is relative stenosis at the C5-6 level with narrowing of the canal and both foramina. Diminished filling of the C6 root sleeves bilaterally. I think the left C7 nerve root sleeve is somewhat effaced as well. No other prominent root sleeve defects.  CT CERVICAL MYELOGRAM FINDINGS:  The foramen magnum is widely patent. C1-2 shows osteoarthritis at the articulation of the anterior arch in the dens and articulation of the lateral masses on the left. No canal or foraminal stenosis.  C2-3: Bilateral facet degeneration. Mild uncovertebral hypertrophy. Mild foraminal narrowing without neural compression.  C3-4: Facet arthropathy on the left. Bilateral uncovertebral hypertrophy. No compressive central canal stenosis. Foraminal stenosis bilaterally because of osteophytic encroachment could affect either C4 nerve root.  C4-5: Bony fusion at this level anteriorly and posteriorly. Wide patency of the central canal. Chronic bony foraminal narrowing left more than right, but unlikely to be associated with neural compression given the fusion.  C5-6: Advanced degenerative spondylosis with endplate osteophytes more prominent towards the right. Bilateral facet arthropathy. Canal narrowing with AP diameter of 9 mm. Foraminal stenosis  right worse than left. Either C6 nerve root could be compressed at this level, more likely the right based on the morphology.  C6-7: Spondylosis with uncovertebral osteophytes left more than right. Facet degeneration and hypertrophy left more than right. Narrowing of the central canal but no cord compression. Foraminal stenosis on the left that could compress the C7 nerve root.  C7-T1: Facet degeneration left more than right. No central canal stenosis. Mild bony foraminal narrowing on the left without gross compression of the exiting C8 nerve root.  Upper thoracic region unremarkable.  IMPRESSION: Anterior and posterior bony fusion at the C4-5 level. No likely neural compression at this level. The foramina are chronically narrowed, left more than right, but due to the fusion, nerve compression as unlikely.  C2-3: Foraminal narrowing on the left primarily because of facet osteophytes.  C3-4: Bilateral foraminal stenosis because of uncovertebral osteophytes and facet arthropathy left more than right. Either C4 nerve root could be compressed, more likely the left.  C5-6: Central canal narrowing with AP diameter of 9 mm. Bilateral foraminal stenosis right worse than left because of osteophytic encroachment. Either or both C6 nerve roots could be compressed.  C6-7: Bony foraminal narrowing on the left could compress the left C7 nerve root.  C7-T1: Left-sided facet arthropathy. Mild foraminal encroachment on the left but without gross neural compression.   Electronically Signed   By: Nelson Chimes M.D.   On: 01/24/2015 16:22    Antibiotics:  Anti-infectives    Start     Dose/Rate Route Frequency Ordered Stop   02/04/15 1800  ceFAZolin (ANCEF) IVPB 2 g/50 mL premix     2 g 100 mL/hr over 30 Minutes Intravenous Every 8 hours 02/04/15 1420 02/05/15 0241   02/04/15 1000  bacitracin 50,000 Units in sodium chloride irrigation 0.9 % 500 mL irrigation  Status:  Discontinued       As needed 02/04/15 1105 02/04/15 1251    02/04/15 0600  ceFAZolin (ANCEF) IVPB 2 g/50 mL premix     2 g 100 mL/hr over 30 Minutes Intravenous On call to O.R. 02/03/15 1400 02/04/15 1000      Discharge Exam: Blood pressure 123/74, pulse 77, temperature 98.2 F (36.8 C), temperature source Oral, resp. rate 18, SpO2 98 %. Neurologic: Grossly normal Incision clean dry and intact  Discharge Medications:     Medication List    STOP taking these medications        predniSONE 10 MG tablet  Commonly known as:  DELTASONE     traMADol 50 MG tablet  Commonly known as:  ULTRAM      TAKE these medications  amLODipine 5 MG tablet  Commonly known as:  NORVASC  Take 5 mg by mouth daily.     apixaban 5 MG Tabs tablet  Commonly known as:  ELIQUIS  Take 1 tablet (5 mg total) by mouth 2 (two) times daily.     CENTRUM SILVER PO  Take 1 tablet by mouth daily.     PRESERVISION AREDS 2 Caps  Take 2 capsules by mouth daily.     Fish Oil 1000 MG Caps  Take 2,000 mg by mouth daily.     fluticasone 50 MCG/ACT nasal spray  Commonly known as:  FLONASE  Place 1 spray into both nostrils daily as needed for allergies.     gabapentin 300 MG capsule  Commonly known as:  NEURONTIN  Take 300 mg by mouth 2 (two) times daily.     HYDROcodone-acetaminophen 5-325 MG per tablet  Commonly known as:  NORCO/VICODIN  Take 1-2 tablets by mouth every 6 (six) hours as needed (mild pain).     labetalol 200 MG tablet  Commonly known as:  NORMODYNE  Take 100 mg by mouth 2 (two) times daily.     losartan-hydrochlorothiazide 50-12.5 MG per tablet  Commonly known as:  HYZAAR  Take 1 tablet by mouth 2 (two) times daily.     RED YEAST RICE PO  Take 2 tablets by mouth daily.        Disposition: Home   Final Dx: ACDF      Discharge Instructions     Remove dressing in 72 hours    Complete by:  As directed      Call MD for:  difficulty breathing, headache or visual disturbances    Complete by:  As directed      Call MD for:   persistant nausea and vomiting    Complete by:  As directed      Call MD for:  redness, tenderness, or signs of infection (pain, swelling, redness, odor or green/yellow discharge around incision site)    Complete by:  As directed      Call MD for:  severe uncontrolled pain    Complete by:  As directed      Call MD for:  temperature >100.4    Complete by:  As directed      Diet - low sodium heart healthy    Complete by:  As directed      Discharge instructions    Complete by:  As directed   No driving, no heavy lifting, may shower, resume your anticoagulant in 5 days     Increase activity slowly    Complete by:  As directed            Follow-up Information    Follow up with Faythe Ghee, MD. Schedule an appointment as soon as possible for a visit in 2 weeks.   Specialty:  Neurosurgery   Contact information:   [address] [place] [address] [phone]        Signed: Eustace Moore 02/05/2015, 8:34 AM

## 2015-02-05 NOTE — Progress Notes (Signed)
Patient alert and oriented, mae's well, voiding adequate amount of urine, swallowing without difficulty, no c/o pain. Patient discharged home with family. Script and discharged instructions given to patient. Patient and family stated understanding of d/c instructions given and has an appointment with MD. 

## 2015-02-05 NOTE — Discharge Instructions (Signed)
Wound Care Keep incision covered and dry for one week. You may shower from the neck down. You may remove outer bandage after one week.  Do not put any creams, lotions, or ointments on incision. Leave steri-strips on neck.  They will fall off by themselves. Activity Walk each and every day, increasing distance each day. No lifting greater than 5 lbs.  Avoid excessive neck motions.. No driving or riding in car until further notice at follow up appointment. If provided with neck brace, wear at all times unless instructed otherwise. Diet Resume your normal diet.  Return to Work Will be discussed at you follow up appointment. Call Your Doctor If Any of These Occur Redness, drainage, or swelling at the wound.  Temperature greater than 101 degrees. Severe pain not relieved by pain medication. Incision starts to come apart. Increased difficulty swallowing. Follow Up Appt Call today for appointment in 1-2 weeks ([phone]) or for problems.  If you have any hardware placed in your spine, you will need an x-ray before your appointment.

## 2015-02-07 NOTE — OR Nursing (Signed)
Added delay code

## 2015-02-08 ENCOUNTER — Encounter (HOSPITAL_COMMUNITY): Payer: Self-pay | Admitting: Neurosurgery

## 2015-02-23 ENCOUNTER — Ambulatory Visit (INDEPENDENT_AMBULATORY_CARE_PROVIDER_SITE_OTHER): Payer: Medicare Other | Admitting: Pulmonary Disease

## 2015-02-23 ENCOUNTER — Encounter: Payer: Self-pay | Admitting: Pulmonary Disease

## 2015-02-23 VITALS — BP 102/64 | HR 82 | Ht 72.0 in | Wt 221.0 lb

## 2015-02-23 DIAGNOSIS — J479 Bronchiectasis, uncomplicated: Secondary | ICD-10-CM

## 2015-02-23 NOTE — Assessment & Plan Note (Signed)
Jose [name] is doing well. He has not had an exacerbation since the last visit. He presumably has mild bronchiectasis related to a childhood infection. His lab work did not show signs of immunoglobulin deficiency.  Plan: -I explained to him that if and when he gets a case of bronchitis he should use Mucinex regularly and the flutter valve 4-5 times a day, should he have a fever or increasing shortness of breath and he should be treated with antibiotics sooner rather than later.

## 2015-02-23 NOTE — Progress Notes (Signed)
Subjective:    Patient ID: Jose [name]., male    DOB: [birth year], 79 y.o.   MRN: [id number]  Synopsis: Referred to the [facility] in 2016 after hospitalization for pneumonia. He was found to have mild lower lobe bronchiectasis at that point which was felt to be related to childhood infection.  HPI Chief Complaint  Patient presents with  . Follow-up    pt has no breathing complaints at this time.  pt has had neck surgery since last visit.     Jose [name] had to have neck surgery since the last visit.  He fell and had a cervical spine injury leading to a lot of pain and ended up with an ACDF performed by Dr. Hal Neer.  He came surgery without difficulty and has been doing OK at home.  E has minimal pain post operatively.    He says that his breathing has been fine.  He says that he has not had cough, fever, chills.     Past Medical History  Diagnosis Date  . History of CT scan of head 1997    small vessel disease  . Gallstones 07/1996  . History of ETT 05/2002     no EKG changes  . History of MRI of cervical spine 07/15/2002  . Rectal bleeding 12/2002    colonoscopy  . Hypertension   . MGUS (monoclonal gammopathy of unknown significance) 04/11/2012  . Skin cancer   . Seizures     last seizure 1976  . TIA (transient ischemic attack)   . Peripheral neuropathy   . 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 Stroke     ?tia ?afib  . Dysrhythmia     afib  . Pneumonia 10/2014  . History of hiatal hernia     3 over period 6 yrs, last 68  studies no dx, no rx  . Arthritis   . Sleep apnea     ? to have study after surgery      Review of Systems     Objective:   Physical Exam Filed Vitals:   02/23/15 1407  BP: 102/64  Pulse: 82  Height: 6' (1.829 m)  Weight: 221 lb (100.245 kg)  SpO2: 96%   RA  Gen: well appearing HENT: OP clear, TM's clear, neck supple PULM: CTA B, normal percussion CV: RRR, no mgr, trace edema GI: BS+, soft, nontender Derm: no  cyanosis or rash Psyche: normal mood and affect  3/16 Immunoglobulin profile normal March 2016 CT chest> mild basilar bronchiectasis, complete clearing of pneumonia      Assessment & Plan:   Bronchiectasis without acute exacerbation Mr. Sherrer is doing well. He has not had an exacerbation since the last visit. He presumably has mild bronchiectasis related to a childhood infection. His lab work did not show signs of immunoglobulin deficiency.  Plan: -I explained to him that if and when he gets a case of bronchitis he should use Mucinex regularly and the flutter valve 4-5 times a day, should he have a fever or increasing shortness of breath and he should be treated with antibiotics sooner rather than later.     Updated Medication List Outpatient Encounter Prescriptions as of 02/23/2015  Medication Sig  . amLODipine (NORVASC) 5 MG tablet Take 5 mg by mouth daily.  Marland Kitchen apixaban (ELIQUIS) 5 MG TABS tablet Take 1 tablet (5 mg total) by mouth 2 (two) times daily.  . fluticasone (FLONASE) 50 MCG/ACT nasal spray Place 1 spray  into both nostrils daily as needed for allergies.   Marland Kitchen gabapentin (NEURONTIN) 300 MG capsule Take 300 mg by mouth 2 (two) times daily.  Marland Kitchen labetalol (NORMODYNE) 200 MG tablet Take 100 mg by mouth 2 (two) times daily.  Marland Kitchen losartan-hydrochlorothiazide (HYZAAR) 50-12.5 MG per tablet Take 1 tablet by mouth 2 (two) times daily.  . Multiple Vitamins-Minerals (CENTRUM SILVER PO) Take 1 tablet by mouth daily.   . Multiple Vitamins-Minerals (PRESERVISION AREDS 2) CAPS Take 2 capsules by mouth daily.  . Omega-3 Fatty Acids (FISH OIL) 1000 MG CAPS Take 2,000 mg by mouth daily.  . Red Yeast Rice Extract (RED YEAST RICE PO) Take 2 tablets by mouth daily.  . [DISCONTINUED] HYDROcodone-acetaminophen (NORCO/VICODIN) 5-325 MG per tablet Take 1-2 tablets by mouth every 6 (six) hours as needed (mild pain). (Patient not taking: Reported on 02/23/2015)

## 2015-02-23 NOTE — Patient Instructions (Signed)
When you get a cold, I recommend that she use Mucinex and the flutter valve as we discussed Follow-up with me in one year or sooner if needed

## 2015-02-24 ENCOUNTER — Ambulatory Visit (INDEPENDENT_AMBULATORY_CARE_PROVIDER_SITE_OTHER): Payer: Medicare Other

## 2015-02-24 ENCOUNTER — Telehealth: Payer: Self-pay | Admitting: Pulmonary Disease

## 2015-02-24 DIAGNOSIS — Z23 Encounter for immunization: Secondary | ICD-10-CM | POA: Diagnosis not present

## 2015-02-24 NOTE — Telephone Encounter (Signed)
Spoke with pt and he states that he checked with his primary md and he has not had the Prevnar and they do not have it at his primary office.  Pt states Dr Lake Bells wants him to get one since he has not had one already.  Pt put on injection schedule to receive Prevnar.

## 2015-02-25 DIAGNOSIS — Z23 Encounter for immunization: Secondary | ICD-10-CM | POA: Diagnosis not present

## 2015-02-28 NOTE — Addendum Note (Signed)
Addendum  created 02/28/15 1814 by Finis Bud, MD   Modules edited: Anesthesia Responsible Staff

## 2015-03-01 ENCOUNTER — Ambulatory Visit (INDEPENDENT_AMBULATORY_CARE_PROVIDER_SITE_OTHER): Payer: Medicare Other | Admitting: Neurology

## 2015-03-01 VITALS — BP 123/79 | HR 86 | Ht 72.0 in | Wt 221.0 lb

## 2015-03-01 DIAGNOSIS — G4733 Obstructive sleep apnea (adult) (pediatric): Secondary | ICD-10-CM

## 2015-03-01 DIAGNOSIS — G479 Sleep disorder, unspecified: Secondary | ICD-10-CM

## 2015-03-01 NOTE — Sleep Study (Signed)
See Scanned documents in Encounters tab

## 2015-03-04 ENCOUNTER — Telehealth: Payer: Self-pay

## 2015-03-04 NOTE — Telephone Encounter (Signed)
Patient calling for his medical records.

## 2015-03-15 ENCOUNTER — Telehealth: Payer: Self-pay | Admitting: Neurology

## 2015-03-15 NOTE — Telephone Encounter (Signed)
Pt called office wanting to check on status of results for sleep study performed 03/01/2015. Pt states he has been anxious to receive the results. Please call pt when request is reviewed.

## 2015-03-15 NOTE — Telephone Encounter (Signed)
I spoke with patient and let him know that sleep study has not been read yet but I will call him as soon as it is.

## 2015-03-16 ENCOUNTER — Telehealth: Payer: Self-pay | Admitting: Neurology

## 2015-03-16 DIAGNOSIS — G4733 Obstructive sleep apnea (adult) (pediatric): Secondary | ICD-10-CM

## 2015-03-16 NOTE — Telephone Encounter (Signed)
See other message. Pt aware of results

## 2015-03-16 NOTE — Telephone Encounter (Signed)
I spoke to patient, he is aware of PSG results. He would like to start CPAP at home. He lives in [place] and has Tricare/Medicare. He made f/u appt for 7/11. He is aware of insurance requirements for compliance and f/u with Dr. Rexene Alberts. I will also send reminder letter to him. PSG faxed to PCP. I will refer to [facility].

## 2015-03-16 NOTE — Telephone Encounter (Signed)
Dr. Jannifer Franklin' patient, seen by him on 12/15/14 and sleep study on 03/01/15.   Beverlee Nims:   Please call and notify patient that the recent sleep study confirmed the diagnosis of severe OSA. He did well with CPAP during the study with significant improvement of the respiratory events. Therefore, I would like start the patient on CPAP therapy at home by prescribing a machine for home use. I placed the order in the chart. The patient will need a follow up appointment with me in 8 to 10 weeks post set up that has to be scheduled; please go ahead and schedule while you have the patient on the phone and make sure patient understands the importance of keeping this window for the FU appointment, as it is often an insurance requirement and failing to adhere to this may result in losing coverage for sleep apnea treatment. 15 min follow-up should suffice, unless there is a 30 min FU slot available.  Please re-enforce the importance of compliance with treatment and the need for [place] to monitor compliance data - again an insurance requirement and good feedback for the patient as far as how they are doing.  Also remind patient, that any upcoming CPAP machine or mask issues, should be first addressed with the DME company. Please ask if patient has a preference regarding DME company.  Ins: Medicare, Tricare  Please arrange for CPAP set up at home through a DME company of patient's choice - once you have spoken to the patient - and faxed/routed report to PCP and referring MD (if other than PCP), you can close this encounter, thanks,   Star Age, MD, PhD [facility] ([place])

## 2015-03-29 ENCOUNTER — Encounter: Payer: Self-pay | Admitting: Neurology

## 2015-03-29 ENCOUNTER — Ambulatory Visit (INDEPENDENT_AMBULATORY_CARE_PROVIDER_SITE_OTHER): Payer: Medicare Other | Admitting: Neurology

## 2015-03-29 VITALS — BP 132/78 | HR 64 | Ht 72.0 in | Wt 229.0 lb

## 2015-03-29 DIAGNOSIS — G609 Hereditary and idiopathic neuropathy, unspecified: Secondary | ICD-10-CM

## 2015-03-29 DIAGNOSIS — E538 Deficiency of other specified B group vitamins: Secondary | ICD-10-CM

## 2015-03-29 DIAGNOSIS — G5622 Lesion of ulnar nerve, left upper limb: Secondary | ICD-10-CM

## 2015-03-29 DIAGNOSIS — G451 Carotid artery syndrome (hemispheric): Secondary | ICD-10-CM

## 2015-03-29 HISTORY — DX: Lesion of ulnar nerve, left upper limb: G56.22

## 2015-03-29 NOTE — Patient Instructions (Signed)

## 2015-03-29 NOTE — Progress Notes (Signed)
Reason for visit: Left ulnar neuropathy  Jose [name]. is an 79 y.o. male  History of present illness:  Jose [name] is a 79 year old right-handed white male with a history of a peripheral neuropathy and an associated left ulnar neuropathy. The patient has undergone cervical spine surgery in April 2016 with excellent results. He no longer has severe pain in the neck and shoulder area. He has had a referral to Dr. Amedeo Plenty through Dr. Hal Neer, but apparently the patient could never get in to the office to be seen for the left ulnar neuropathy. The patient has had demonstrated issues with peripheral neuropathy by prior nerve conduction studies. He has not undergone an evaluation for this issue. He denies any falls, he is not having significant discomfort in the feet. Indicates that the left hand is getting weaker over time, he can no longer lift a gallon of milk with the left hand. He returns for further evaluation.  Past Medical History  Diagnosis Date  . History of CT scan of head 1997    small vessel disease  . Gallstones 07/1996  . History of ETT 05/2002     no EKG changes  . History of MRI of cervical spine 07/15/2002  . Rectal bleeding 12/2002    colonoscopy  . Hypertension   . MGUS (monoclonal gammopathy of unknown significance) 04/11/2012  . Skin cancer   . Seizures     last seizure 1976  . TIA (transient ischemic attack)   . Peripheral neuropathy   . 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 Stroke     ?tia ?afib  . Dysrhythmia     afib  . Pneumonia 10/2014  . History of hiatal hernia     3 over period 6 yrs, last 46  studies no dx, no rx  . Arthritis   . Sleep apnea     ? to have study after surgery  . 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 Skin cancer excision  07/1993    left lateral arm  . Keratosis excision Right 05/1998    wrist  . Ulnar nerve transfer  12/1999    left  .  Colonoscopy w/ polypectomy  04/19/2003    tubular adenoma  . Shoulder injection  June 2012    left, [place]  . Thumb injection  June 2012    left, [place]  . Great toe arthrodesis, interphalangeal joint Left 1978  . Tonsillectomy    . Adenoidectomy    . Cataract extraction Bilateral   . Shoulder arthroscopy Left 15    rotator cuff   . Anterior cervical decomp/discectomy fusion N/A 02/04/2015    Procedure: ANTERIOR CERVICAL DECOMPRESSION/DISCECTOMY FUSION CERVICAL FIVE-SIX,CERVICAL SIX-SEVEN;  Surgeon: Karie Chimera, MD;  Location: [facility];  Service: Neurosurgery;  Laterality: N/A;    Family History  Problem Relation Age of Onset  . Dementia Mother 62    vascular  . Transient ischemic attack Mother   . Dementia Father     after hip fracture  . Hypertension Sister     skin cancer  . Seizures Neg Hx     Social history:  reports that he quit smoking about 47 years ago. He has never used smokeless tobacco. He reports that he drinks about 1.2 oz of alcohol per week. He reports that he does not use illicit drugs.    Allergies  Allergen  Reactions  . Tetracycline Hcl Rash    Medications:  Prior to Admission medications   Medication Sig Start Date End Date Taking? Authorizing Provider  amLODipine (NORVASC) 5 MG tablet Take 5 mg by mouth daily.   Yes Historical Provider, MD  apixaban (ELIQUIS) 5 MG TABS tablet Take 1 tablet (5 mg total) by mouth 2 (two) times daily. 12/06/14  Yes Kathrynn Ducking, MD  fluticasone [facility]) 50 MCG/ACT nasal spray Place 1 spray into both nostrils daily as needed for allergies.    Yes Historical Provider, MD  gabapentin (NEURONTIN) 300 MG capsule Take 300 mg by mouth 2 (two) times daily.   Yes Historical Provider, MD  labetalol (NORMODYNE) 200 MG tablet Take 100 mg by mouth 2 (two) times daily.   Yes Historical Provider, MD  losartan-hydrochlorothiazide (HYZAAR) 50-12.5 MG per tablet Take 1 tablet by mouth 2 (two) times daily.   Yes Historical Provider,  MD  Multiple Vitamins-Minerals (CENTRUM SILVER PO) Take 1 tablet by mouth daily.    Yes Historical Provider, MD  Multiple Vitamins-Minerals (PRESERVISION AREDS 2) CAPS Take 2 capsules by mouth daily.   Yes Historical Provider, MD  Omega-3 Fatty Acids (FISH OIL) 1000 MG CAPS Take 2,000 mg by mouth daily.   Yes Historical Provider, MD  Red Yeast Rice Extract (RED YEAST RICE PO) Take 2 tablets by mouth daily.   Yes Historical Provider, MD    ROS:  Out of a complete 14 system review of symptoms, the patient complains only of the following symptoms, and all other reviewed systems are negative.  Eye discharge, eye itching, eye redness Numbness, weakness Neck stiffness  Blood pressure 132/78, pulse 64, height 6' (1.829 m), weight 229 lb (103.874 kg).  Physical Exam  General: The patient is alert and cooperative at the time of the examination.  Skin: No significant peripheral edema is noted.   Neurologic Exam  Mental status: The patient is alert and oriented x 3 at the time of the examination. The patient has apparent normal recent and remote memory, with an apparently normal attention span and concentration ability.   Cranial nerves: Facial symmetry is present. Speech is normal, no aphasia or dysarthria is noted. Extraocular movements are full. Visual fields are full.  Motor: The patient has good strength in all 4 extremities, with exception of some slight weakness of the intrinsic muscles of the left hand, and the distal flexor of the left with finger. There is atrophy of the left first dorsal interosseous muscle.  Sensory examination: Soft touch sensation is symmetric on the face, arms, and legs.  Coordination: The patient has good finger-nose-finger and heel-to-shin bilaterally.  Gait and station: The patient has a normal gait. Tandem gait is normal. Romberg is negative. No drift is seen.  Reflexes: Deep tendon reflexes are symmetric.   Assessment/Plan:  1. Peripheral  neuropathy  2. Left ulnar neuropathy  The patient will be referred to Dr. Fredna Dow for an evaluation of the left ulnar neuropathy. He will have blood work done today to evaluate the etiology of the peripheral neuropathy. Otherwise, he will follow-up through this office on an as-needed basis.  Jill Alexanders MD 03/29/2015 7:37 PM  [facility] [address] [place], [place] [address]  Phone [phone] Fax [phone]

## 2015-03-30 ENCOUNTER — Ambulatory Visit: Payer: TRICARE For Life (TFL) | Admitting: Neurology

## 2015-03-31 ENCOUNTER — Telehealth: Payer: Self-pay | Admitting: Neurology

## 2015-03-31 LAB — MULTIPLE MYELOMA PANEL, SERUM
ALBUMIN SERPL ELPH-MCNC: 3.8 g/dL (ref 3.2–5.6)
ALPHA2 GLOB SERPL ELPH-MCNC: 0.8 g/dL (ref 0.4–1.2)
Albumin/Glob SerPl: 1.4 (ref 0.7–2.0)
Alpha 1: 0.2 g/dL (ref 0.1–0.4)
B-Globulin SerPl Elph-Mcnc: 1 g/dL (ref 0.6–1.3)
Gamma Glob SerPl Elph-Mcnc: 0.9 g/dL (ref 0.5–1.6)
Globulin, Total: 2.9 g/dL (ref 2.0–4.5)
IgA/Immunoglobulin A, Serum: 71 mg/dL (ref 61–437)
IgG (Immunoglobin G), Serum: 785 mg/dL (ref 700–1600)
IgM (Immunoglobulin M), Srm: 90 mg/dL (ref 15–143)
TOTAL PROTEIN: 6.7 g/dL (ref 6.0–8.5)

## 2015-03-31 LAB — ENA+DNA/DS+SJORGEN'S
ENA RNP AB: 3.7 AI — AB (ref 0.0–0.9)
ENA SM Ab Ser-aCnc: <0.2 AI (ref 0.0–0.9)

## 2015-03-31 LAB — RHEUMATOID FACTOR: Rhuematoid fact SerPl-aCnc: 8.8 IU/mL (ref 0.0–13.9)

## 2015-03-31 LAB — ANGIOTENSIN CONVERTING ENZYME: Angio Convert Enzyme: 25 U/L (ref 14–82)

## 2015-03-31 LAB — VITAMIN B12: Vitamin B-12: 881 pg/mL (ref 211–946)

## 2015-03-31 LAB — ANA W/REFLEX: ANA: POSITIVE — AB

## 2015-03-31 LAB — B. BURGDORFI ANTIBODIES: Lyme IgG/IgM Ab: <0.91 {ISR} (ref 0.00–0.90)

## 2015-03-31 NOTE — Telephone Encounter (Signed)
I called the patient. The blood work was relatively unremarkable with exception of a positive ANA, this is associated with a RNP antibody elevation in isolation. I'm not sure that this is clinically significant. I discussed this with him.

## 2015-05-09 ENCOUNTER — Ambulatory Visit: Payer: Self-pay | Admitting: Neurology

## 2015-05-17 ENCOUNTER — Other Ambulatory Visit: Payer: Self-pay | Admitting: Neurology

## 2015-06-01 ENCOUNTER — Ambulatory Visit (INDEPENDENT_AMBULATORY_CARE_PROVIDER_SITE_OTHER): Payer: Medicare Other | Admitting: Neurology

## 2015-06-01 ENCOUNTER — Encounter: Payer: Self-pay | Admitting: Neurology

## 2015-06-01 VITALS — BP 116/64 | HR 70 | Resp 18 | Ht 72.0 in | Wt 230.0 lb

## 2015-06-01 DIAGNOSIS — Z9989 Dependence on other enabling machines and devices: Principal | ICD-10-CM

## 2015-06-01 DIAGNOSIS — G4733 Obstructive sleep apnea (adult) (pediatric): Secondary | ICD-10-CM | POA: Diagnosis not present

## 2015-06-01 DIAGNOSIS — I480 Paroxysmal atrial fibrillation: Secondary | ICD-10-CM | POA: Diagnosis not present

## 2015-06-01 DIAGNOSIS — G458 Other transient cerebral ischemic attacks and related syndromes: Secondary | ICD-10-CM | POA: Diagnosis not present

## 2015-06-01 DIAGNOSIS — G609 Hereditary and idiopathic neuropathy, unspecified: Secondary | ICD-10-CM | POA: Diagnosis not present

## 2015-06-01 NOTE — Progress Notes (Signed)
Subjective:    Patient ID: Jose [name]. is a 79 y.o. male.  HPI     Star Age, MD, PhD [facility] [address] [place], [place] [address]  Dear Jose [name],   I saw your patient, Jose [name], upon your kind request, in my clinic today for initial consultation of his obstructive sleep apnea, after his recent sleep study. The patient is unaccompanied today. As you know, [name] is a 79 year old right-handed gentleman with an underlying medical history of new onset A. fib, obesity, reflux disease, recurrent TIA, hypertension, MGUS, ulnar neuropathy who you referred for sleep study secondary to a report of witnessed apneas during his hospitalization in 1/16, and snoring reported. He had a split-night sleep study on 03/01/2015 and went over his test results with him in detail today. His baseline sleep efficiency was reduced at 77% with a latency to sleep of 5.5 minutes and wake after sleep onset of 29.5 minutes with moderate sleep fragmentation noted. He had a markedly elevated arousal index at 73.3 per hour, secondary to respiratory events. He had absence of slow-wave sleep and REM sleep prior to CPAP initiation. He had no significant PLMS, EKG or EEG changes. He had moderate snoring. He had a total AHI highly elevated at 71.3 per hour, average oxygen saturation was 93%, nadir was 83% during non-REM sleep. He was then placed on CPAP therapy. Sleep efficiency was 87.5% with a latency to sleep of 4 minutes and wake after sleep onset of 27 minutes with mild to moderate sleep fragmentation noted. He had a tremendous improvement in his arousal index to 3.6 per hour. He had 12.2% of slow-wave sleep and achieved 28.9% of REM sleep. Average oxygen saturation was 93%, nadir was 81%. He had no significant PLMS during the treatment portion of the study. CPAP was titrated from 5 cm to 13 cm with a reduction of his AHI to 2.8 per hour at a pressure of 12 cm. Based on  his test results I prescribed CPAP therapy for home use.   Today, 06/01/2015: I reviewed his CPAP compliance data from 05/01/2015 through 05/30/2015 which is a total of 30 days during which time he used his machine every night with percent used days greater than 4 hours at 93%, indicating excellent compliance with an average usage of 6 hours and 32 minutes, residual AHI at 3.4 per hour, leak acceptable with the 95th percentile at 20.2 L/m on a pressure of 12 cm with EPR of 3.  Today, 06/01/2015: He reports doing well. He has adjusted well to the nasal pillows and overall tolerates the pressure and sleeping on his back. He sleeps with less interruption, has less daytime somnolence and overall is quite pleased with how he is doing. He had ulnar nerve surgery about 3 weeks ago, takes neurontin for neuropathy and takes Eliquis. He sees a cardiologist in [facility]. He goes to bed around 11 PM and his rise time is around 7 AM. He wakes up better rested. His neuropathy seems stable to him. In the interim, he had neck surgery on 02/04/2015 under Dr. Hal Neer. He feels improved. He has a follow-up with his hand surgeon next week. He has not had any recent TIA type symptoms. He drinks alcohol occasionally. He quit smoking in the late 60s. He drinks quite a bit of coffee, at least 2 12 ounce cups a day.  His Past Medical History Is Significant For: Past Medical History  Diagnosis Date  .  History of CT scan of head 1997    small vessel disease  . Gallstones 07/1996  . History of ETT 05/2002     no EKG changes  . History of MRI of cervical spine 07/15/2002  . Rectal bleeding 12/2002    colonoscopy  . Hypertension   . MGUS (monoclonal gammopathy of unknown significance) 04/11/2012  . Skin cancer   . Seizures     last seizure 1976  . TIA (transient ischemic attack)   . Peripheral neuropathy   . 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 Stroke     ?tia ?afib  . Dysrhythmia     afib  . Pneumonia 10/2014   . History of hiatal hernia     3 over period 6 yrs, last 54  studies no dx, no rx  . Arthritis   . Sleep apnea     ? to have study after surgery  . 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 extremity 03/29/2015    His Past Surgical History Is Significant For: Past Surgical History  Procedure Laterality Date  . Skin cancer excision  07/1993    left lateral arm  . Keratosis excision Right 05/1998    wrist  . Ulnar nerve transfer  12/1999    left  . Colonoscopy w/ polypectomy  04/19/2003    tubular adenoma  . Shoulder injection  June 2012    left, [place]  . Thumb injection  June 2012    left, [place]  . Great toe arthrodesis, interphalangeal joint Left 1978  . Tonsillectomy    . Adenoidectomy    . Cataract extraction Bilateral   . Shoulder arthroscopy Left 13    rotator cuff   . Anterior cervical decomp/discectomy fusion N/A 02/04/2015    Procedure: ANTERIOR CERVICAL DECOMPRESSION/DISCECTOMY FUSION CERVICAL FIVE-SIX,CERVICAL SIX-SEVEN;  Surgeon: Karie Chimera, MD;  Location: [facility];  Service: Neurosurgery;  Laterality: N/A;    His Family History Is Significant For: Family History  Problem Relation Age of Onset  . Dementia Mother 55    vascular  . Transient ischemic attack Mother   . Dementia Father     after hip fracture  . Hypertension Sister     skin cancer  . Seizures Neg Hx     His Social History Is Significant For: History   Social History  . Marital Status: Married    Spouse Name: [name]  . Number of Children: 2  . Years of Education: 19   Occupational History  . retired    Social History Main Topics  . Smoking status: Former Smoker -- 15 years    Quit date: 03/05/1968  . Smokeless tobacco: Never Used  . Alcohol Use: 1.2 oz/week    2 Standard drinks or equivalent per week  . Drug Use: No  . Sexual Activity: Not on file   Other Topics Concern  . Not on file   Social History Narrative   Lives with 4th wife, Jose [name],  married 2010   Retired, organizational psychology   [employer] [place], [place], [name], 2 children   Daughter, Jose [name], in [place], 2 children   Patient is right handed   Patient drinks approximately 3-4 cups of caffeine daily    His Allergies Are:  Allergies  Allergen Reactions  . Tetracycline Hcl Rash  :   His Current Medications Are:  Outpatient Encounter Prescriptions as of 06/01/2015  Medication Sig  . amLODipine (NORVASC) 5  MG tablet Take 5 mg by mouth daily.  Marland Kitchen ELIQUIS 5 MG TABS tablet TAKE 1 TABLET TWICE A DAY  . fluticasone (FLONASE) 50 MCG/ACT nasal spray Place 1 spray into both nostrils daily as needed for allergies.   Marland Kitchen gabapentin (NEURONTIN) 300 MG capsule Take 300 mg by mouth 2 (two) times daily.  Marland Kitchen labetalol (NORMODYNE) 200 MG tablet Take 100 mg by mouth 2 (two) times daily.  Marland Kitchen losartan-hydrochlorothiazide (HYZAAR) 50-12.5 MG per tablet Take 1 tablet by mouth 2 (two) times daily.  . Multiple Vitamins-Minerals (CENTRUM SILVER PO) Take 1 tablet by mouth daily.   . Multiple Vitamins-Minerals (PRESERVISION AREDS 2) CAPS Take 2 capsules by mouth daily.  . Omega-3 Fatty Acids (FISH OIL) 1000 MG CAPS Take 2,000 mg by mouth daily.  . Red Yeast Rice Extract (RED YEAST RICE PO) Take 2 tablets by mouth daily.   No facility-administered encounter medications on file as of 06/01/2015.  :  Review of Systems:  Out of a complete 14 point review of systems, all are reviewed and negative with the exception of these symptoms as listed below:   Review of Systems  Neurological:       Patient feels like he is doing well with CPAP. He states that he is less fatigue during the day. He says that he sleeps less hours during the night, when he wakes up he is ready to start his day.     Objective:  Neurologic Exam  Physical Exam Physical Examination:   Filed Vitals:   06/01/15 1407  BP: 116/64  Pulse: 70  Resp: 18    General Examination: The patient is a very pleasant 79 y.o. male  in no acute distress. He appears well-developed and well-nourished and well groomed.   HEENT: Normocephalic, atraumatic, pupils are equal, round and reactive to light and accommodation. Funduscopic exam is normal with sharp disc margins noted. Extraocular tracking is good without limitation to gaze excursion or nystagmus noted. Normal smooth pursuit is noted. Hearing is grossly intact. Tympanic membranes are clear bilaterally. Face is symmetric with normal facial animation and normal facial sensation. Speech is clear with no dysarthria noted. There is no hypophonia. There is no lip, neck/head, jaw or voice tremor. Neck is supple with full range of passive and active motion. There are no carotid bruits on auscultation. Oropharynx exam reveals: mild mouth dryness, adequate dental hygiene and moderate airway crowding, due to narrow airway and thicker tongue and redundant soft palate. Mallampati is class II. Tongue protrudes centrally and palate elevates symmetrically. Tonsils are absent. Neck size is 18.25 inches. He has a Mild overbite. Nasal inspection reveals no significant nasal mucosal bogginess or redness and no septal deviation.   Chest: Clear to auscultation without wheezing, rhonchi or crackles noted.  Heart: S1+S2+0, regular and normal without murmurs, rubs or gallops noted.   Abdomen: Soft, non-tender and non-distended with normal bowel sounds appreciated on auscultation.  Extremities: There is no pitting edema in the distal lower extremities bilaterally. Pedal pulses are intact.  Skin: Warm and dry without trophic changes noted. There are no varicose veins.  Musculoskeletal: exam reveals no obvious joint deformities, tenderness or joint swelling or erythema, except for a hard brace on his L arm.    Neurologically:  Mental status: The patient is awake, alert and oriented in all 4 spheres. His immediate and remote memory, attention, language skills and fund of knowledge are appropriate. There  is no evidence of aphasia, agnosia, apraxia or anomia. Speech is clear  with normal prosody and enunciation. Thought process is linear. Mood is normal and affect is normal.  Cranial nerves II - XII are as described above under HEENT exam. In addition: shoulder shrug is normal with equal shoulder height noted. Motor exam: Normal bulk, strength and tone is noted. There is no drift, tremor or rebound. Romberg is negative. Reflexes are 2+ throughout. Fine motor skills and coordination: intact in the right upper extremity, left upper extremity is not testable because of the hard brace.  Sensory exam: intact to light touch, pinprick, vibration, temperature sense in the upper and lower extremities, with the exception of decreased pinprick and vibration sensation in the distal lower extremities up to mid shin areas bilaterally.  Gait, station and balance: He stands easily. No veering to one side is noted. No leaning to one side is noted. Posture is age-appropriate and stance is narrow based. Gait shows normal stride length and normal pace. No problems turning are noted. He turns en bloc.    Assessment and Plan:   In summary, Kinta Martis. is a very pleasant 79 y.o.-year old male with an underlying medical history of new onset A. fib, obesity, reflux disease, recurrent TIA, hypertension, MGUS, ulnar neuropathy who you referred for sleep study secondary to a report of witnessed apneas during his hospitalization in 1/16, and snoring reported. His sleep study from May 2016 showed evidence of severe obstructive sleep apnea. Since then he has established treatment on CPAP. He is fully compliant with treatment and indicates good results from CPAP treatment, in particular better consolidated sleep, less daytime somnolence, better sleep quality and waking up better rested. He is advised about his sleep test results in detail today and we also went over his compliance data together. He is congratulated on his treatment  adherence. In the interim, he has had surgery to his left ulnar nerve and also neck surgery. Overall he is doing rather well. He does not have a scheduled appointment with you and feels stable with respect to his neurological symptoms.  I had a long chat with the patient about my findings and the diagnosis of OSA, its prognosis and treatment options. We talked about medical treatments, surgical interventions and non-pharmacological approaches. I explained in particular the risks and ramifications of untreated moderate to severe OSA, especially with respect to developing cardiovascular disease down the Road, including congestive heart failure, difficult to treat hypertension, cardiac arrhythmias, or stroke. Even type 2 diabetes has, in part, been linked to untreated OSA. Symptoms of untreated OSA include daytime sleepiness, memory problems, mood irritability and mood disorder such as depression and anxiety, lack of energy, as well as recurrent headaches, especially morning headaches. We talked about trying to maintain a healthy lifestyle in general, as well as the importance of weight control. I encouraged the patient to eat healthy, exercise daily and keep well hydrated, to keep a scheduled bedtime and wake time routine, to not skip any meals and eat healthy snacks in between meals. I advised the patient not to drive when feeling sleepy. He is advised to scale back on his coffee intake. We also talked about secondary stroke prevention today. I recommended the following at this time: Continue CPAP therapy regularly.   I explained the importance of being compliant with PAP treatment, not only for insurance purposes but primarily to improve His symptoms, and for the patient's long term health benefit, including to reduce His cardiovascular risks. I would like to see him back routinely in 6 months, sooner  if the need arises. I answered all his questions today and the patient was in agreement.  Thank you very  much for allowing me to participate in the care of this nice patient. If I can be of any further assistance to you please do not hesitate to talk to me,   Sincerely,   Star Age, MD, PhD

## 2015-06-01 NOTE — Patient Instructions (Signed)

## 2015-11-01 ENCOUNTER — Emergency Department (HOSPITAL_BASED_OUTPATIENT_CLINIC_OR_DEPARTMENT_OTHER)
Admission: EM | Admit: 2015-11-01 | Discharge: 2015-11-01 | Disposition: A | Discharge status: Home / Self Care | Payer: Medicare Other | Attending: Emergency Medicine | Admitting: Emergency Medicine

## 2015-11-01 ENCOUNTER — Encounter (HOSPITAL_BASED_OUTPATIENT_CLINIC_OR_DEPARTMENT_OTHER): Payer: Self-pay | Admitting: *Deleted

## 2015-11-01 ENCOUNTER — Telehealth: Payer: Self-pay | Admitting: Pulmonary Disease

## 2015-11-01 ENCOUNTER — Emergency Department (HOSPITAL_BASED_OUTPATIENT_CLINIC_OR_DEPARTMENT_OTHER): Payer: Medicare Other

## 2015-11-01 DIAGNOSIS — J159 Unspecified bacterial pneumonia: Secondary | ICD-10-CM | POA: Insufficient documentation

## 2015-11-01 DIAGNOSIS — J189 Pneumonia, unspecified organism: Secondary | ICD-10-CM

## 2015-11-01 DIAGNOSIS — Z8719 Personal history of other diseases of the digestive system: Secondary | ICD-10-CM | POA: Diagnosis not present

## 2015-11-01 DIAGNOSIS — G629 Polyneuropathy, unspecified: Secondary | ICD-10-CM | POA: Insufficient documentation

## 2015-11-01 DIAGNOSIS — Z79899 Other long term (current) drug therapy: Secondary | ICD-10-CM | POA: Insufficient documentation

## 2015-11-01 DIAGNOSIS — Z87891 Personal history of nicotine dependence: Secondary | ICD-10-CM | POA: Diagnosis not present

## 2015-11-01 DIAGNOSIS — R05 Cough: Secondary | ICD-10-CM | POA: Diagnosis present

## 2015-11-01 DIAGNOSIS — Z85828 Personal history of other malignant neoplasm of skin: Secondary | ICD-10-CM | POA: Diagnosis not present

## 2015-11-01 DIAGNOSIS — Z8673 Personal history of transient ischemic attack (TIA), and cerebral infarction without residual deficits: Secondary | ICD-10-CM | POA: Insufficient documentation

## 2015-11-01 DIAGNOSIS — I10 Essential (primary) hypertension: Secondary | ICD-10-CM | POA: Diagnosis not present

## 2015-11-01 DIAGNOSIS — M1990 Unspecified osteoarthritis, unspecified site: Secondary | ICD-10-CM | POA: Insufficient documentation

## 2015-11-01 DIAGNOSIS — Z7951 Long term (current) use of inhaled steroids: Secondary | ICD-10-CM | POA: Insufficient documentation

## 2015-11-01 DIAGNOSIS — G5622 Lesion of ulnar nerve, left upper limb: Secondary | ICD-10-CM | POA: Diagnosis not present

## 2015-11-01 LAB — CBC WITH DIFFERENTIAL/PLATELET
BASOS ABS: 0 10*3/uL (ref 0.0–0.1)
Basophils Relative: 0 %
EOS ABS: 0.2 10*3/uL (ref 0.0–0.7)
Eosinophils Relative: 3 %
HCT: 38.5 % — ABNORMAL LOW (ref 39.0–52.0)
Hemoglobin: 12.7 g/dL — ABNORMAL LOW (ref 13.0–17.0)
LYMPHS PCT: 19 %
Lymphs Abs: 1.3 10*3/uL (ref 0.7–4.0)
MCH: 31.7 pg (ref 26.0–34.0)
MCHC: 33 g/dL (ref 30.0–36.0)
MCV: 96 fL (ref 78.0–100.0)
Monocytes Absolute: 0.6 10*3/uL (ref 0.1–1.0)
Monocytes Relative: 9 %
Neutro Abs: 4.9 10*3/uL (ref 1.7–7.7)
Neutrophils Relative %: 69 %
PLATELETS: 222 10*3/uL (ref 150–400)
RBC: 4.01 MIL/uL — AB (ref 4.22–5.81)
RDW: 12.9 % (ref 11.5–15.5)
WBC: 7.1 10*3/uL (ref 4.0–10.5)

## 2015-11-01 LAB — BASIC METABOLIC PANEL
Anion gap: 9 (ref 5–15)
BUN: 20 mg/dL (ref 6–20)
CO2: 29 mmol/L (ref 22–32)
Calcium: 9.1 mg/dL (ref 8.9–10.3)
Chloride: 102 mmol/L (ref 101–111)
Creatinine, Ser: 0.82 mg/dL (ref 0.61–1.24)
GFR calc Af Amer: >60 mL/min (ref >60)
GFR calc non Af Amer: >60 mL/min (ref >60)
Glucose, Bld: 114 mg/dL — ABNORMAL HIGH (ref 65–99)
POTASSIUM: 3.5 mmol/L (ref 3.5–5.1)
SODIUM: 140 mmol/L (ref 135–145)

## 2015-11-01 LAB — BRAIN NATRIURETIC PEPTIDE: B Natriuretic Peptide: 15.5 pg/mL (ref 0.0–100.0)

## 2015-11-01 MED ORDER — AZITHROMYCIN 250 MG PO TABS: 250.0000 mg | ORAL_TABLET | Freq: Every day | ORAL | Status: DC

## 2015-11-01 MED ORDER — AEROCHAMBER PLUS W/MASK MISC
1.0000 | Freq: Once | Status: AC
  Administered 2015-11-01: 1
  Filled 2015-11-01: qty 1

## 2015-11-01 MED ORDER — AZITHROMYCIN 250 MG PO TABS
500.0000 mg | ORAL_TABLET | Freq: Once | ORAL | Status: AC
  Administered 2015-11-01: 500 mg via ORAL
  Filled 2015-11-01: qty 2

## 2015-11-01 MED ORDER — ALBUTEROL SULFATE HFA 108 (90 BASE) MCG/ACT IN AERS
2.0000 | INHALATION_SPRAY | Freq: Once | RESPIRATORY_TRACT | Status: AC
  Administered 2015-11-01: 2 via RESPIRATORY_TRACT
  Filled 2015-11-01: qty 6.7

## 2015-11-01 MED ORDER — IPRATROPIUM-ALBUTEROL 0.5-2.5 (3) MG/3ML IN SOLN
3.0000 mL | RESPIRATORY_TRACT | Status: DC
  Administered 2015-11-01: 3 mL via RESPIRATORY_TRACT
  Filled 2015-11-01: qty 3

## 2015-11-01 MED ORDER — HYDROCODONE-HOMATROPINE 5-1.5 MG/5ML PO SYRP: 5.0000 mL | ORAL_SOLUTION | Freq: Four times a day (QID) | ORAL | Status: DC | PRN

## 2015-11-01 MED ORDER — CEFTRIAXONE SODIUM 2 G IJ SOLR
INTRAMUSCULAR | Status: AC
  Filled 2015-11-01: qty 2

## 2015-11-01 MED ORDER — DEXTROSE 5 % IV SOLN
2.0000 g | Freq: Once | INTRAVENOUS | Status: AC
  Administered 2015-11-01: 2 g via INTRAVENOUS

## 2015-11-01 NOTE — Telephone Encounter (Signed)
Called spoke with pt regarding recs. He is currently in the ED waiting to be seen. FYI to BQ.

## 2015-11-01 NOTE — Telephone Encounter (Signed)
[phone] or [phone] pt called back

## 2015-11-01 NOTE — Telephone Encounter (Signed)
use Mucinex regularly and the flutter valve 4-5 times a day, should he have a fever or increasing shortness of breath and he should call us to be seen.

## 2015-11-01 NOTE — ED Provider Notes (Signed)
CSN: [id number]     Arrival date & time 11/01/15  1459 History   First MD Initiated Contact with Patient 11/01/15 1540     Chief Complaint  Patient presents with  . Cough     (Consider location/radiation/quality/duration/timing/severity/associated sxs/prior Treatment) HPI Patient reports he developed sinusitis about 10 days ago. At that time he had a lot of nasal discharge and facial pain. He reports he was treated with a course of penicillin and had been improving. He reports yesterday however he started developing a really deep cough and becoming short of breath. He reports that continues today with harsh coughing episodes and sputum production. He reports he had a coughing episode that made him feel very short of breath. He denies he's had severe chest pain but reports now after a lot of coughing is starting to get a little achy. He denies fever or chills. He reports about this time last year he had a pneumonia for which she had to get hospitalized. He reports he feels like he is getting the pneumonia again. No lower extremity swelling or pain. No vomiting, diarrhea or abdominal pain. Past Medical History  Diagnosis Date  . History of CT scan of head 1997    small vessel disease  . Gallstones 07/1996  . History of ETT 05/2002     no EKG changes  . History of MRI of cervical spine 07/15/2002  . Rectal bleeding 12/2002    colonoscopy  . Hypertension   . MGUS (monoclonal gammopathy of unknown significance) 04/11/2012  . Skin cancer   . Seizures ([place])     last seizure 1976  . TIA (transient ischemic attack)   . Peripheral neuropathy ([place])   . 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 Stroke [facility])     ?tia ?afib  . Dysrhythmia     afib  . Pneumonia 10/2014  . History of hiatal hernia     3 over period 6 yrs, last 62  studies no dx, no rx  . Arthritis   . Sleep apnea     ? to have study after surgery  . 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 Skin cancer excision  07/1993    left lateral arm  . Keratosis excision Right 05/1998    wrist  . Ulnar nerve transfer  12/1999    left  . Colonoscopy w/ polypectomy  04/19/2003    tubular adenoma  . Shoulder injection  June 2012    left, [place]  . Thumb injection  June 2012    left, [place]  . Great toe arthrodesis, interphalangeal joint Left 1978  . Tonsillectomy    . Adenoidectomy    . Cataract extraction Bilateral   . Shoulder arthroscopy Left 15    rotator cuff   . Anterior cervical decomp/discectomy fusion N/A 02/04/2015    Procedure: ANTERIOR CERVICAL DECOMPRESSION/DISCECTOMY FUSION CERVICAL FIVE-SIX,CERVICAL SIX-SEVEN;  Surgeon: Karie Chimera, MD;  Location: [place] [facility];  Service: Neurosurgery;  Laterality: N/A;   Family History  Problem Relation Age of Onset  . Dementia Mother 26    vascular  . Transient ischemic attack Mother   . Dementia Father     after hip fracture  . Hypertension Sister     skin cancer  . Seizures Neg Hx    Social History  Substance Use Topics  . Smoking status: Former Smoker -- 15 years  Quit date: 03/05/1968  . Smokeless tobacco: Never Used  . Alcohol Use: 1.2 oz/week    2 Standard drinks or equivalent per week    Review of Systems  10 Systems reviewed and are negative for acute change except as noted in the HPI.   Allergies  Tetracycline hcl  Home Medications   Prior to Admission medications   Medication Sig Start Date End Date Taking? Authorizing Provider  amLODipine (NORVASC) 5 MG tablet Take 5 mg by mouth daily.    Historical Provider, MD  azithromycin (ZITHROMAX) 250 MG tablet Take 1 tablet (250 mg total) by mouth daily. Take 1 every day until finished. 11/01/15   Charlesetta Shanks, MD  ELIQUIS 5 MG TABS tablet TAKE 1 TABLET TWICE A DAY 05/17/15   Kathrynn Ducking, MD  fluticasone [facility]) 50 MCG/ACT nasal spray Place 1 spray into both nostrils daily as needed for allergies.     Historical  Provider, MD  gabapentin (NEURONTIN) 300 MG capsule Take 300 mg by mouth 2 (two) times daily.    Historical Provider, MD  HYDROcodone-homatropine (HYCODAN) 5-1.5 MG/5ML syrup Take 5 mLs by mouth every 6 (six) hours as needed for cough. 11/01/15   Charlesetta Shanks, MD  labetalol (NORMODYNE) 200 MG tablet Take 100 mg by mouth 2 (two) times daily.    Historical Provider, MD  losartan-hydrochlorothiazide (HYZAAR) 50-12.5 MG per tablet Take 1 tablet by mouth 2 (two) times daily.    Historical Provider, MD  Multiple Vitamins-Minerals (CENTRUM SILVER PO) Take 1 tablet by mouth daily.     Historical Provider, MD  Multiple Vitamins-Minerals (PRESERVISION AREDS 2) CAPS Take 2 capsules by mouth daily.    Historical Provider, MD  Omega-3 Fatty Acids (FISH OIL) 1000 MG CAPS Take 2,000 mg by mouth daily.    Historical Provider, MD  Red Yeast Rice Extract (RED YEAST RICE PO) Take 2 tablets by mouth daily.    Historical Provider, MD   BP 137/83 mmHg  Pulse 76  Temp(Src) 98.1 F (36.7 C) (Oral)  Resp 20  Ht 6' (1.829 m)  Wt 230 lb (104.327 kg)  BMI 31.19 kg/m2  SpO2 96% Physical Exam  Constitutional: He is oriented to person, place, and time. He appears well-developed and well-nourished.  No respiratory distress and nontoxic.  HENT:  Head: Normocephalic and atraumatic.  Mouth/Throat: Oropharynx is clear and moist.  Eyes: EOM are normal. Pupils are equal, round, and reactive to light.  Neck: Neck supple.  Cardiovascular: Normal rate, regular rhythm, normal heart sounds and intact distal pulses.   Pulmonary/Chest: Effort normal.  Patient has coarse rhonchi. He develops cough paroxysmal with deep inspiration. Adequate air flow to basis.  Abdominal: Soft. Bowel sounds are normal. He exhibits no distension. There is no tenderness.  Musculoskeletal: Normal range of motion. He exhibits no edema or tenderness.  Neurological: He is alert and oriented to person, place, and time. He has normal strength. Coordination  normal. GCS eye subscore is 4. GCS verbal subscore is 5. GCS motor subscore is 6.  Skin: Skin is warm, dry and intact.  Psychiatric: He has a normal mood and affect.    ED Course  Procedures (including critical care time) Labs Review Labs Reviewed  BASIC METABOLIC PANEL - Abnormal; Notable for the following:    Glucose, Bld 114 (*)    All other components within normal limits  CBC WITH DIFFERENTIAL/PLATELET - Abnormal; Notable for the following:    RBC 4.01 (*)    Hemoglobin 12.7 (*)  HCT 38.5 (*)    All other components within normal limits  CULTURE, BLOOD (ROUTINE X 2)  CULTURE, BLOOD (ROUTINE X 2)  BRAIN NATRIURETIC PEPTIDE    Imaging Review Dg Chest 2 View  11/01/2015  CLINICAL DATA:  Cough, sinus congestion for 2 weeks EXAM: CHEST  2 VIEW COMPARISON:  11/24/2014 FINDINGS: Cardiomediastinal silhouette is stable. Mild infrahilar bronchitic changes. There is streaky left base retrocardiac atelectasis or early infiltrate. Degenerative changes left shoulder. Degenerative changes lower thoracic spine. IMPRESSION: Mild infrahilar bronchitic changes. Streaky left base retrocardiac atelectasis or early infiltrate. Electronically Signed   By: Lahoma Crocker M.D.   On: 11/01/2015 15:27   I have personally reviewed and evaluated these images and lab results as part of my medical decision-making.   EKG Interpretation None      MDM   Final diagnoses:  Community acquired pneumonia   Patient presents forth very harsh cough and rhonchi. Chest x-ray does show some areas of infiltrate. Patient had preceding URI. At this time he will be treated for a community-acquired pneumonia. Patient is stable with stable vital signs, normal renal function, no leukocytosis and no respiratory distress. I do feel he is appropriate for initiating outpatient treatment with close follow-up with his family physician.    Charlesetta Shanks, MD 11/01/15 [phone]

## 2015-11-01 NOTE — Telephone Encounter (Signed)
Attempted to contact pt. He answered the phone then the line went dead. Will try back.

## 2015-11-01 NOTE — Discharge Instructions (Signed)

## 2015-11-01 NOTE — Telephone Encounter (Signed)
Spoke with pt. States that he has developed a chest cold. Reports increased coughing, chest congestion, SOB and wheezing. Denies fever or chest tightness. Did go to urgent care on Friday for a URI. Was given an antibiotic and is still currently on that. Would like BQ's recommendations.  BQ - please advise.  Thanks.

## 2015-11-02 MED FILL — HYDROCODONE-HOMATROPINE SYR: 5-1.5 | 6 days supply | Qty: 120 | Fill #0

## 2015-11-06 LAB — CULTURE, BLOOD (ROUTINE X 2)
CULTURE: NO GROWTH
CULTURE: NO GROWTH

## 2015-11-15 MED FILL — predniSONE 10 MG (48) TBPK: 10 | 12 days supply | Qty: 48 | Fill #0

## 2015-11-24 ENCOUNTER — Emergency Department (HOSPITAL_COMMUNITY): Payer: Medicare Other

## 2015-11-24 ENCOUNTER — Encounter (HOSPITAL_COMMUNITY): Payer: Self-pay

## 2015-11-24 ENCOUNTER — Emergency Department (HOSPITAL_COMMUNITY)
Admission: EM | Admit: 2015-11-24 | Discharge: 2015-11-24 | Disposition: A | Discharge status: Home / Self Care | Payer: Medicare Other | Attending: Emergency Medicine | Admitting: Emergency Medicine

## 2015-11-24 DIAGNOSIS — Z7952 Long term (current) use of systemic steroids: Secondary | ICD-10-CM | POA: Diagnosis not present

## 2015-11-24 DIAGNOSIS — Z8701 Personal history of pneumonia (recurrent): Secondary | ICD-10-CM | POA: Diagnosis not present

## 2015-11-24 DIAGNOSIS — Z8719 Personal history of other diseases of the digestive system: Secondary | ICD-10-CM | POA: Insufficient documentation

## 2015-11-24 DIAGNOSIS — R42 Dizziness and giddiness: Secondary | ICD-10-CM | POA: Diagnosis not present

## 2015-11-24 DIAGNOSIS — Z85828 Personal history of other malignant neoplasm of skin: Secondary | ICD-10-CM | POA: Insufficient documentation

## 2015-11-24 DIAGNOSIS — I10 Essential (primary) hypertension: Secondary | ICD-10-CM | POA: Diagnosis not present

## 2015-11-24 DIAGNOSIS — Z87891 Personal history of nicotine dependence: Secondary | ICD-10-CM | POA: Diagnosis not present

## 2015-11-24 DIAGNOSIS — G629 Polyneuropathy, unspecified: Secondary | ICD-10-CM | POA: Diagnosis not present

## 2015-11-24 DIAGNOSIS — R2689 Other abnormalities of gait and mobility: Secondary | ICD-10-CM | POA: Diagnosis present

## 2015-11-24 DIAGNOSIS — Z7902 Long term (current) use of antithrombotics/antiplatelets: Secondary | ICD-10-CM | POA: Insufficient documentation

## 2015-11-24 DIAGNOSIS — H5500 Unspecified nystagmus: Secondary | ICD-10-CM | POA: Insufficient documentation

## 2015-11-24 DIAGNOSIS — Z8673 Personal history of transient ischemic attack (TIA), and cerebral infarction without residual deficits: Secondary | ICD-10-CM | POA: Insufficient documentation

## 2015-11-24 DIAGNOSIS — Z79899 Other long term (current) drug therapy: Secondary | ICD-10-CM | POA: Insufficient documentation

## 2015-11-24 DIAGNOSIS — R001 Bradycardia, unspecified: Secondary | ICD-10-CM | POA: Diagnosis not present

## 2015-11-24 DIAGNOSIS — J0101 Acute recurrent maxillary sinusitis: Secondary | ICD-10-CM | POA: Insufficient documentation

## 2015-11-24 DIAGNOSIS — M1990 Unspecified osteoarthritis, unspecified site: Secondary | ICD-10-CM | POA: Insufficient documentation

## 2015-11-24 DIAGNOSIS — Z862 Personal history of diseases of the blood and blood-forming organs and certain disorders involving the immune mechanism: Secondary | ICD-10-CM | POA: Diagnosis not present

## 2015-11-24 LAB — COMPREHENSIVE METABOLIC PANEL
ALBUMIN: 3.5 g/dL (ref 3.5–5.0)
ALT: 83 U/L — AB (ref 17–63)
ANION GAP: 9 (ref 5–15)
AST: 47 U/L — ABNORMAL HIGH (ref 15–41)
Alkaline Phosphatase: 43 U/L (ref 38–126)
BUN: 30 mg/dL — ABNORMAL HIGH (ref 6–20)
CHLORIDE: 104 mmol/L (ref 101–111)
CO2: 27 mmol/L (ref 22–32)
Calcium: 8.8 mg/dL — ABNORMAL LOW (ref 8.9–10.3)
Creatinine, Ser: 0.85 mg/dL (ref 0.61–1.24)
GFR calc non Af Amer: >60 mL/min (ref >60)
GLUCOSE: 113 mg/dL — AB (ref 65–99)
Potassium: 4 mmol/L (ref 3.5–5.1)
SODIUM: 140 mmol/L (ref 135–145)
Total Bilirubin: 1.1 mg/dL (ref 0.3–1.2)
Total Protein: 5.9 g/dL — ABNORMAL LOW (ref 6.5–8.1)

## 2015-11-24 LAB — CBC
HCT: 38.7 % — ABNORMAL LOW (ref 39.0–52.0)
Hemoglobin: 12.9 g/dL — ABNORMAL LOW (ref 13.0–17.0)
MCH: 31.7 pg (ref 26.0–34.0)
MCHC: 33.3 g/dL (ref 30.0–36.0)
MCV: 95.1 fL (ref 78.0–100.0)
PLATELETS: 189 10*3/uL (ref 150–400)
RBC: 4.07 MIL/uL — AB (ref 4.22–5.81)
RDW: 13.9 % (ref 11.5–15.5)
WBC: 8.4 10*3/uL (ref 4.0–10.5)

## 2015-11-24 LAB — DIFFERENTIAL
BASOS PCT: 0 %
Basophils Absolute: 0 10*3/uL (ref 0.0–0.1)
EOS PCT: 0 %
Eosinophils Absolute: 0 10*3/uL (ref 0.0–0.7)
LYMPHS PCT: 21 %
Lymphs Abs: 1.7 10*3/uL (ref 0.7–4.0)
MONO ABS: 0.7 10*3/uL (ref 0.1–1.0)
Monocytes Relative: 9 %
NEUTROS ABS: 5.9 10*3/uL (ref 1.7–7.7)
NEUTROS PCT: 70 %

## 2015-11-24 LAB — URINALYSIS, ROUTINE W REFLEX MICROSCOPIC
Bilirubin Urine: NEGATIVE
Glucose, UA: NEGATIVE mg/dL
Hgb urine dipstick: NEGATIVE
Ketones, ur: NEGATIVE mg/dL
LEUKOCYTES UA: NEGATIVE
Nitrite: NEGATIVE
PROTEIN: NEGATIVE mg/dL
SPECIFIC GRAVITY, URINE: 1.021 (ref 1.005–1.030)
pH: 6.5 (ref 5.0–8.0)

## 2015-11-24 LAB — ETHANOL

## 2015-11-24 LAB — PROTIME-INR
INR: 1.35 (ref 0.00–1.49)
PROTHROMBIN TIME: 16.8 s — AB (ref 11.6–15.2)

## 2015-11-24 LAB — CBG MONITORING, ED: Glucose-Capillary: 113 mg/dL — ABNORMAL HIGH (ref 65–99)

## 2015-11-24 LAB — APTT: aPTT: 32 seconds (ref 24–37)

## 2015-11-24 LAB — I-STAT TROPONIN, ED: Troponin i, poc: 0.02 ng/mL (ref 0.00–0.08)

## 2015-11-24 MED ORDER — OXYMETAZOLINE HCL 0.05 % NA SOLN: 1.0000 | Freq: Two times a day (BID) | NASAL | Status: DC

## 2015-11-24 MED ORDER — MOMETASONE FUROATE 50 MCG/ACT NA SUSP: 2.0000 | Freq: Every day | NASAL | Status: DC

## 2015-11-24 MED ORDER — MECLIZINE HCL 25 MG PO TABS
25.0000 mg | ORAL_TABLET | Freq: Once | ORAL | Status: AC
  Administered 2015-11-24: 25 mg via ORAL
  Filled 2015-11-24: qty 1

## 2015-11-24 MED ORDER — MECLIZINE HCL 25 MG PO TABS: 25.0000 mg | ORAL_TABLET | Freq: Three times a day (TID) | ORAL | Status: DC | PRN

## 2015-11-24 MED ORDER — AZITHROMYCIN 250 MG PO TABS: 250.0000 mg | ORAL_TABLET | Freq: Every day | ORAL | Status: DC

## 2015-11-24 MED ORDER — LORATADINE 10 MG PO TABS: 10.0000 mg | ORAL_TABLET | Freq: Every day | ORAL | Status: DC

## 2015-11-24 MED FILL — AZITHROMYCIN 250 MG TABLET: 250 | 5 days supply | Qty: 6 | Fill #0

## 2015-11-24 MED FILL — MECLIZINE 25 MG TABLET: 25 | 10 days supply | Qty: 30 | Fill #0

## 2015-11-24 MED FILL — LORATADINE 10 MG TABLET: 10 | 30 days supply | Qty: 30 | Fill #0

## 2015-11-24 MED FILL — FLUTICASONE PROP 50 MCG SPR: 50 | 30 days supply | Qty: 16 | Fill #0

## 2015-11-24 MED FILL — NASAL DECONGESTANT 0.05% SP: 0.05 | 30 days supply | Qty: 15 | Fill #0

## 2015-11-24 NOTE — ED Notes (Signed)
Pt arrives EMS with c/o "heaviness at left side, mostly in leg". C/O fullness in head. Pt c/o several episode of balance issues. Pt states symptoms noted on rising from bed this AM. MAEW Ambulates to stretcher

## 2015-11-24 NOTE — Discharge Instructions (Signed)
Dizziness Dizziness is a common problem. It is a feeling of unsteadiness or light-headedness. You may feel like you are about to faint. Dizziness can lead to injury if you stumble or fall. Anyone can become dizzy, but dizziness is more common in older adults. This condition can be caused by a number of things, including medicines, dehydration, or illness. HOME CARE INSTRUCTIONS Taking these steps may help with your condition: Eating and Drinking  Drink enough fluid to keep your urine clear or pale yellow. This helps to keep you from becoming dehydrated. Try to drink more clear fluids, such as water.  Do not drink alcohol.  Limit your caffeine intake if directed by your health care provider.  Limit your salt intake if directed by your health care provider. Activity  Avoid making quick movements.  Rise slowly from chairs and steady yourself until you feel okay.  In the morning, first sit up on the side of the bed. When you feel okay, stand slowly while you hold onto something until you know that your balance is fine.  Move your legs often if you need to stand in one place for a long time. Tighten and relax your muscles in your legs while you are standing.  Do not drive or operate heavy machinery if you feel dizzy.  Avoid bending down if you feel dizzy. Place items in your home so that they are easy for you to reach without leaning over. Lifestyle  Do not use any tobacco products, including cigarettes, chewing tobacco, or electronic cigarettes. If you need help quitting, ask your health care provider.  Try to reduce your stress level, such as with yoga or meditation. Talk with your health care provider if you need help. General Instructions  Watch your dizziness for any changes.  Take medicines only as directed by your health care provider. Talk with your health care provider if you think that your dizziness is caused by a medicine that you are taking.  Tell a friend or a family  member that you are feeling dizzy. If he or she notices any changes in your behavior, have this person call your health care provider.  Keep all follow-up visits as directed by your health care provider. This is important. SEEK MEDICAL CARE IF:  Your dizziness does not go away.  Your dizziness or light-headedness gets worse.  You feel nauseous.  You have reduced hearing.  You have new symptoms.  You are unsteady on your feet or you feel like the room is spinning. SEEK IMMEDIATE MEDICAL CARE IF:  You vomit or have diarrhea and are unable to eat or drink anything.  You have problems talking, walking, swallowing, or using your arms, hands, or legs.  You feel generally weak.  You are not thinking clearly or you have trouble forming sentences. It may take a friend or family member to notice this.  You have chest pain, abdominal pain, shortness of breath, or sweating.  Your vision changes.  You notice any bleeding.  You have a headache.  You have neck pain or a stiff neck.  You have a fever.   This information is not intended to replace advice given to you by your health care provider. Make sure you discuss any questions you have with your health care provider.   Document Released: 04/10/2001 Document Revised: 03/01/2015 Document Reviewed: 10/11/2014 Elsevier Interactive Patient Education 2016 [employer]  Sinusitis, Adult Sinusitis is redness, soreness, and puffiness (inflammation) of the air pockets in the bones of your  face (sinuses). The redness, soreness, and puffiness can cause air and mucus to get trapped in your sinuses. This can allow germs to grow and cause an infection.  HOME CARE   Drink enough fluids to keep your pee (urine) clear or pale yellow.  Use a humidifier in your home.  Run a hot shower to create steam in the bathroom. Sit in the bathroom with the door closed. Breathe in the steam 3-4 times a day.  Put a warm, moist washcloth on your face 3-4  times a day, or as told by your doctor.  Use salt water sprays (saline sprays) to wet the thick fluid in your nose. This can help the sinuses drain.  Only take medicine as told by your doctor. GET HELP RIGHT AWAY IF:   Your pain gets worse.  You have very bad headaches.  You are sick to your stomach (nauseous).  You throw up (vomit).  You are very sleepy (drowsy) all the time.  Your face is puffy (swollen).  Your vision changes.  You have a stiff neck.  You have trouble breathing. MAKE SURE YOU:   Understand these instructions.  Will watch your condition.  Will get help right away if you are not doing well or get worse.   This information is not intended to replace advice given to you by your health care provider. Make sure you discuss any questions you have with your health care provider.   Document Released: 04/02/2008 Document Revised: 11/05/2014 Document Reviewed: 05/20/2012 Elsevier Interactive Patient Education [employer].

## 2015-11-24 NOTE — ED Provider Notes (Signed)
CSN: [id number]     Arrival date & time 11/24/15  [id number] History   First MD Initiated Contact with Patient 11/24/15 [phone]     Chief Complaint  Patient presents with  . Neurologic Problem     (Consider location/radiation/quality/duration/timing/severity/associated sxs/prior Treatment) Patient is a 80 y.o. male presenting with neurologic complaint.  Neurologic Problem Pertinent negatives include no headaches and no shortness of breath.   Patient states he went to bed in his normal state of health around 10:30 PM yesterday evening. He woke at 5 AM to go to the bathroom. He states he was off balance and had to hold himself up while walking. He states he was leaning towards the left. He denies any dizziness or lightheadedness. He states he had no focal weakness or numbness. No changes in his vision. States he did have some fullness in the left forehead area. He then went back to bed and woke up 7 AM with similar symptoms. He states that when lying down he is asymptomatic. He states he's had a previously similar episode last year that resolved spontaneously. Eyes any chest pain, shortness of breath, abdominal pain, nausea or vomiting. No recent changes to his medications or missed doses. No recent trauma. Denies headache or neck pain. Past Medical History  Diagnosis Date  . History of CT scan of head 1997    small vessel disease  . Gallstones 07/1996  . History of ETT 05/2002     no EKG changes  . History of MRI of cervical spine 07/15/2002  . Rectal bleeding 12/2002    colonoscopy  . Hypertension   . MGUS (monoclonal gammopathy of unknown significance) 04/11/2012  . Skin cancer   . Seizures ([place])     last seizure 1976  . TIA (transient ischemic attack)   . Peripheral neuropathy ([place])   . 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 Stroke [facility])     ?tia ?afib  . Dysrhythmia     afib  . Pneumonia 10/2014  . History of hiatal hernia     3 over period 6 yrs, last 56  studies no dx, no rx   . Arthritis   . Sleep apnea     ? to have study after surgery  . 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 Skin cancer excision  07/1993    left lateral arm  . Keratosis excision Right 05/1998    wrist  . Ulnar nerve transfer  12/1999    left  . Colonoscopy w/ polypectomy  04/19/2003    tubular adenoma  . Shoulder injection  June 2012    left, [place]  . Thumb injection  June 2012    left, [place]  . Great toe arthrodesis, interphalangeal joint Left 1978  . Tonsillectomy    . Adenoidectomy    . Cataract extraction Bilateral   . Shoulder arthroscopy Left 15    rotator cuff   . Anterior cervical decomp/discectomy fusion N/A 02/04/2015    Procedure: ANTERIOR CERVICAL DECOMPRESSION/DISCECTOMY FUSION CERVICAL FIVE-SIX,CERVICAL SIX-SEVEN;  Surgeon: Karie Chimera, MD;  Location: [place] [facility];  Service: Neurosurgery;  Laterality: N/A;   Family History  Problem Relation Age of Onset  . Dementia Mother 47    vascular  . Transient ischemic attack Mother   . Dementia Father     after hip fracture  . Hypertension Sister     skin cancer  .  Seizures Neg Hx    Social History  Substance Use Topics  . Smoking status: Former Smoker -- 15 years    Quit date: 03/05/1968  . Smokeless tobacco: Never Used  . Alcohol Use: 1.2 oz/week    2 Standard drinks or equivalent per week    Review of Systems  Constitutional: Negative for fever and chills.  Eyes: Negative for visual disturbance.  Respiratory: Negative for cough and shortness of breath.   Gastrointestinal: Negative for nausea, vomiting, diarrhea and abdominal distention.  Musculoskeletal: Positive for gait problem. Negative for back pain and neck pain.  Skin: Negative for rash and wound.  Neurological: Negative for dizziness, speech difficulty, weakness, light-headedness, numbness and headaches.  All other systems reviewed and are negative.     Allergies   Tetracycline hcl  Home Medications   Prior to Admission medications   Medication Sig Start Date End Date Taking? Authorizing Provider  amLODipine (NORVASC) 5 MG tablet Take 5 mg by mouth daily.   Yes Historical Provider, MD  ELIQUIS 5 MG TABS tablet TAKE 1 TABLET TWICE A DAY 05/17/15  Yes Kathrynn Ducking, MD  fluticasone [facility]) 50 MCG/ACT nasal spray Place 1 spray into both nostrils daily as needed for allergies.    Yes Historical Provider, MD  gabapentin (NEURONTIN) 300 MG capsule Take 300 mg by mouth 2 (two) times daily.   Yes Historical Provider, MD  labetalol (NORMODYNE) 200 MG tablet Take 100 mg by mouth 2 (two) times daily.   Yes Historical Provider, MD  losartan-hydrochlorothiazide (HYZAAR) 50-12.5 MG per tablet Take 1 tablet by mouth 2 (two) times daily.   Yes Historical Provider, MD  Multiple Vitamins-Minerals (PRESERVISION AREDS 2) CAPS Take 2 capsules by mouth daily.   Yes Historical Provider, MD  Omega-3 Fatty Acids (FISH OIL) 1000 MG CAPS Take 2,000 mg by mouth daily.   Yes Historical Provider, MD  predniSONE (STERAPRED UNI-PAK 48 TAB) 10 MG (48) TBPK tablet Take 1 tablet by mouth daily. 11/15/15  Yes Historical Provider, MD  Red Yeast Rice Extract (RED YEAST RICE PO) Take 2 tablets by mouth daily.   Yes Historical Provider, MD  azithromycin (ZITHROMAX) 250 MG tablet Take 1 tablet (250 mg total) by mouth daily. Take 1 every day until finished. 11/24/15   Julianne Rice, MD  HYDROcodone-homatropine [facility]) 5-1.5 MG/5ML syrup Take 5 mLs by mouth every 6 (six) hours as needed for cough. Patient not taking: Reported on 11/24/2015 11/01/15   Charlesetta Shanks, MD  loratadine (CLARITIN) 10 MG tablet Take 1 tablet (10 mg total) by mouth daily. 11/24/15   Julianne Rice, MD  meclizine (ANTIVERT) 25 MG tablet Take 1 tablet (25 mg total) by mouth 3 (three) times daily as needed for dizziness or nausea. 11/24/15   Julianne Rice, MD  mometasone (NASONEX) 50 MCG/ACT nasal spray Place 2 sprays  into the nose daily. 11/24/15   Julianne Rice, MD  oxymetazoline (AFRIN NASAL SPRAY) 0.05 % nasal spray Place 1 spray into both nostrils 2 (two) times daily. Used a more than 3 days 11/24/15   Julianne Rice, MD   BP 144/90 mmHg  Pulse 44  Temp(Src) 98.2 F (36.8 C) (Oral)  Resp 14  Ht 6' (1.829 m)  Wt 220 lb (99.791 kg)  BMI 29.83 kg/m2  SpO2 93% Physical Exam  Constitutional: He is oriented to person, place, and time. He appears well-developed and well-nourished. No distress.  HENT:  Head: Normocephalic and atraumatic.  Mouth/Throat: Oropharynx is clear and moist. No oropharyngeal exudate.  No evidence of trauma. Nasal mucosal edema.  Eyes: EOM are normal. Pupils are equal, round, and reactive to light.  Rotary nystagmus noted  Neck: Normal range of motion. Neck supple. No JVD present.  No meningismus  Cardiovascular: Regular rhythm.  Exam reveals no gallop and no friction rub.   No murmur heard. Bradycardia  Pulmonary/Chest: Effort normal and breath sounds normal. No respiratory distress. He has no wheezes. He has no rales.  Abdominal: Soft. Bowel sounds are normal. He exhibits no distension. There is no tenderness. There is no rebound and no guarding.  Musculoskeletal: Normal range of motion. He exhibits no edema or tenderness.  Neurological: He is alert and oriented to person, place, and time.  Patient is alert and oriented x3 with clear, goal oriented speech. Patient has 5/5 motor in all extremities. Sensation is intact to light touch. Bilateral finger-to-nose is normal with no signs of dysmetria.   Skin: Skin is warm and dry. No rash noted. No erythema.  Psychiatric: He has a normal mood and affect. His behavior is normal.  Nursing note and vitals reviewed.   ED Course  Procedures (including critical care time) Labs Review Labs Reviewed  PROTIME-INR - Abnormal; Notable for the following:    Prothrombin Time 16.8 (*)    All other components within normal limits  CBC -  Abnormal; Notable for the following:    RBC 4.07 (*)    Hemoglobin 12.9 (*)    HCT 38.7 (*)    All other components within normal limits  COMPREHENSIVE METABOLIC PANEL - Abnormal; Notable for the following:    Glucose, Bld 113 (*)    BUN 30 (*)    Calcium 8.8 (*)    Total Protein 5.9 (*)    AST 47 (*)    ALT 83 (*)    All other components within normal limits  CBG MONITORING, ED - Abnormal; Notable for the following:    Glucose-Capillary 113 (*)    All other components within normal limits  ETHANOL  APTT  DIFFERENTIAL  URINALYSIS, ROUTINE W REFLEX MICROSCOPIC (NOT AT [facility])  Randolm Idol, ED    Imaging Review Mr Brain Wo Contrast  11/24/2015  CLINICAL DATA:  27 -year-old male with left side heaviness, off balance. Initial encounter. EXAM: MRI HEAD WITHOUT CONTRAST TECHNIQUE: Multiplanar, multiecho pulse sequences of the brain and surrounding structures were obtained without intravenous contrast. COMPARISON:  Brain MRI 10/13/2014. FINDINGS: Major intracranial vascular flow voids are stable and within normal limits. Stable cerebral volume. No restricted diffusion to suggest acute infarction. No midline shift, mass effect, evidence of mass lesion, ventriculomegaly, extra-axial collection or acute intracranial hemorrhage. Cervicomedullary junction and pituitary are within normal limits. Negative visualized cervical spine. Scattered small, mild for age nonspecific cerebral white matter foci of T2 and FLAIR hyperintensity have not significantly changed. No cortical encephalomalacia or chronic cerebral blood products identified. Deep gray matter nuclei, brainstem, and cerebellum are within normal limits. Visible internal auditory structures appear normal. Trace inferior mastoid fluid on the left, appears inconsequential. The mastoids otherwise are clear. Negative nasopharynx. Mild left maxillary sinus mucosal thickening. Other paranasal sinuses are clear. Stable orbit and scalp soft  tissues. Normal bone marrow signal. IMPRESSION: Stable and largely unremarkable negative for age noncontrast MRI appearance of the brain. No acute intracranial abnormality. Minimal left maxillary sinus and mastoid inflammation, appears inconsequential. Electronically Signed   By: Genevie Ann M.D.   On: 11/24/2015 13:42   I have personally reviewed and evaluated these images and  lab results as part of my medical decision-making.   EKG Interpretation   Date/Time:  Thursday November 24 2015 08:39:16 EST Ventricular Rate:  52 PR Interval:  172 QRS Duration: 104 QT Interval:  439 QTC Calculation: 408 R Axis:   7 Text Interpretation:  Sinus rhythm Paired ventricular premature complexes  Aberrant conduction of SV complex(es) Abnormal R-wave progression, early  transition Abnormal T, consider ischemia, inferior leads Minimal ST  elevation, anterior leads Confirmed by Lita Mains  MD, Keymani Mclean ([id number]) on  11/24/2015 9:09:16 AM      MDM   Final diagnoses:  Dizziness  Acute recurrent maxillary sinusitis   patient with long history of sinus disease. States he takes antibiotics occasionally which improved symptoms. He is on no antihistamines. He continues to have a normal neurologic exam. Will ambulate in the hallway. Suspect his off balance was due to peripheral vertigo. We'll treat with meclizine, antihistamines and antibiotic. He is advised to follow-up with ENT.    Patient is ambulatory without any difficulty. No dizziness or unsteadiness.  Julianne Rice, MD 11/24/15 1438

## 2015-11-24 NOTE — ED Notes (Signed)
Pt ambulates in hall with steady gait and requires no assistance.

## 2015-11-24 NOTE — ED Notes (Signed)
Patient transported to MRI 

## 2015-11-24 NOTE — ED Notes (Signed)
Pt verbalizes understanding of instructions. 

## 2015-12-05 ENCOUNTER — Encounter: Payer: Self-pay | Admitting: Neurology

## 2015-12-05 ENCOUNTER — Ambulatory Visit (INDEPENDENT_AMBULATORY_CARE_PROVIDER_SITE_OTHER): Payer: Medicare Other | Admitting: Neurology

## 2015-12-05 VITALS — BP 114/68 | HR 52 | Resp 16 | Ht 72.0 in | Wt 225.0 lb

## 2015-12-05 DIAGNOSIS — Z9989 Dependence on other enabling machines and devices: Principal | ICD-10-CM

## 2015-12-05 DIAGNOSIS — G4733 Obstructive sleep apnea (adult) (pediatric): Secondary | ICD-10-CM | POA: Diagnosis not present

## 2015-12-05 NOTE — Patient Instructions (Signed)

## 2015-12-05 NOTE — Progress Notes (Signed)
Subjective:    Patient ID: Jose [name]. is a 80 y.o. male.  HPI     Interim history:   Jose [name] is a 80 year old right-handed gentleman with an underlying medical history of new onset A. fib, obesity, reflux disease, recurrent TIA, hypertension, MGUS, ulnar neuropathy who presents for follow-up consultation of his OSA, treated with CPAP therapy. The patient is unaccompanied today. I first met him on 06/01/2015 at the request of Dr. Jannifer Franklin, at which time we talked about his split-night sleep study results from 03/01/2015 as well as his compliance data. He reported doing well. He had adjusted well to treatment. He felt improved with respect to sleep disruption, daytime somnolence, and sleep quality. He had recent ulnar nerve surgery about 3 weeks prior. He was taking gabapentin for neuropathy. He was seeing a cardiologist out of [place] and was on Eliquis. He goes to bed around 11 PM and his rise time is around 7 AM. He wakes up better rested. His neuropathy seems stable to him. In the interim, he had neck surgery on 02/04/2015 under Dr. Hal Neer. He feels improved. He has a follow-up with his hand surgeon next week. He has not had any recent TIA type symptoms. He drinks alcohol occasionally. He quit smoking in the late 60s. He drinks quite a bit of coffee, at least 2 12 ounce cups a day.  Today, 12/05/2015: I reviewed his CPAP compliance data from 11/01/2015 through 11/30/2015 which is a total of 30 days during which time he used his machine every night with percent used days greater than 4 hours at 100%, indicating superb compliance with an average usage of 7 hours and 29 minutes, residual AHI low at 0.4 per hour, leak borderline with the 95th percentile at 24.3 L/m on a pressure of 12 cm with EPR of 3.  Today, 12/05/2015: He reports doing well. He had to switch to a nasal mask as he had recurrent sores on the right with the pillows. In the interim, he went to the emergency room on 11/01/2015  and was diagnosed with community-acquired pneumonia. He presented to the emergency room again on 11/24/2015 with balance issues and was diagnosed with peripheral vertigo and given a prescription for meclizine.   Previously: 06/01/2015: He was referred for a sleep study secondary to a report of witnessed apneas during his hospitalization in 1/16, and snoring reported. He had a split-night sleep study on 03/01/2015 and went over his test results with him in detail today. His baseline sleep efficiency was reduced at 77% with a latency to sleep of 5.5 minutes and wake after sleep onset of 29.5 minutes with moderate sleep fragmentation noted. He had a markedly elevated arousal index at 73.3 per hour, secondary to respiratory events. He had absence of slow-wave sleep and REM sleep prior to CPAP initiation. He had no significant PLMS, EKG or EEG changes. He had moderate snoring. He had a total AHI highly elevated at 71.3 per hour, average oxygen saturation was 93%, nadir was 83% during non-REM sleep. He was then placed on CPAP therapy. Sleep efficiency was 87.5% with a latency to sleep of 4 minutes and wake after sleep onset of 27 minutes with mild to moderate sleep fragmentation noted. He had a tremendous improvement in his arousal index to 3.6 per hour. He had 12.2% of slow-wave sleep and achieved 28.9% of REM sleep. Average oxygen saturation was 93%, nadir was 81%. He had no significant PLMS during the treatment portion of the study. CPAP  was titrated from 5 cm to 13 cm with a reduction of his AHI to 2.8 per hour at a pressure of 12 cm. Based on his test results I prescribed CPAP therapy for home use.   I reviewed his CPAP compliance data from 05/01/2015 through 05/30/2015 which is a total of 30 days during which time he used his machine every night with percent used days greater than 4 hours at 93%, indicating excellent compliance with an average usage of 6 hours and 32 minutes, residual AHI at 3.4 per hour, leak  acceptable with the 95th percentile at 20.2 L/m on a pressure of 12 cm with EPR of 3.  His Past Medical History Is Significant For: Past Medical History  Diagnosis Date  . History of CT scan of head 1997    small vessel disease  . Gallstones 07/1996  . History of ETT 05/2002     no EKG changes  . History of MRI of cervical spine 07/15/2002  . Rectal bleeding 12/2002    colonoscopy  . Hypertension   . MGUS (monoclonal gammopathy of unknown significance) 04/11/2012  . Skin cancer   . Seizures ([place])     last seizure 1976  . TIA (transient ischemic attack)   . Peripheral neuropathy ([place])   . 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 Stroke [facility])     ?tia ?afib  . Dysrhythmia     afib  . Pneumonia 10/2014  . History of hiatal hernia     3 over period 6 yrs, last 35  studies no dx, no rx  . Arthritis   . Sleep apnea     ? to have study after surgery  . 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 extremity 03/29/2015    His Past Surgical History Is Significant For: Past Surgical History  Procedure Laterality Date  . Skin cancer excision  07/1993    left lateral arm  . Keratosis excision Right 05/1998    wrist  . Ulnar nerve transfer  12/1999    left  . Colonoscopy w/ polypectomy  04/19/2003    tubular adenoma  . Shoulder injection  June 2012    left, [place]  . Thumb injection  June 2012    left, [place]  . Great toe arthrodesis, interphalangeal joint Left 1978  . Tonsillectomy    . Adenoidectomy    . Cataract extraction Bilateral   . Shoulder arthroscopy Left 15    rotator cuff   . Anterior cervical decomp/discectomy fusion N/A 02/04/2015    Procedure: ANTERIOR CERVICAL DECOMPRESSION/DISCECTOMY FUSION CERVICAL FIVE-SIX,CERVICAL SIX-SEVEN;  Surgeon: Karie Chimera, MD;  Location: [place] [facility];  Service: Neurosurgery;  Laterality: N/A;    His Family History Is Significant For: Family History  Problem Relation Age of Onset  . Dementia Mother 63    vascular  . Transient  ischemic attack Mother   . Dementia Father     after hip fracture  . Hypertension Sister     skin cancer  . Seizures Neg Hx     His Social History Is Significant For: Social History   Social History  . Marital Status: Married    Spouse Name: [name]  . Number of Children: 2  . Years of Education: 19   Occupational History  . retired    Social History Main Topics  . Smoking status: Former Smoker -- 15 years    Quit date: 03/05/1968  . Smokeless tobacco: Never Used  .  Alcohol Use: 1.2 oz/week    2 Standard drinks or equivalent per week  . Drug Use: No  . Sexual Activity: Not Asked   Other Topics Concern  . None   Social History Narrative   Lives with 4th wife, [name], married 2010   Retired, organizational psychology   [place], [place], [name], 2 children   Daughter, [place], in [place], 2 children   Patient is right handed   Patient drinks approximately 3-4 cups of caffeine daily    His Allergies Are:  Allergies  Allergen Reactions  . Tetracycline Hcl Rash  :   His Current Medications Are:  Outpatient Encounter Prescriptions as of 12/05/2015  Medication Sig  . amLODipine (NORVASC) 5 MG tablet Take 5 mg by mouth daily.  Marland Kitchen ELIQUIS 5 MG TABS tablet TAKE 1 TABLET TWICE A DAY  . fluticasone (FLONASE) 50 MCG/ACT nasal spray Place 1 spray into both nostrils daily as needed for allergies.   Marland Kitchen gabapentin (NEURONTIN) 300 MG capsule Take 300 mg by mouth 2 (two) times daily.  Marland Kitchen labetalol (NORMODYNE) 200 MG tablet Take 100 mg by mouth 2 (two) times daily.  Marland Kitchen loratadine (CLARITIN) 10 MG tablet Take 1 tablet (10 mg total) by mouth daily.  Marland Kitchen losartan-hydrochlorothiazide (HYZAAR) 50-12.5 MG per tablet Take 1 tablet by mouth 2 (two) times daily.  . meclizine (ANTIVERT) 25 MG tablet Take 1 tablet (25 mg total) by mouth 3 (three) times daily as needed for dizziness or nausea.  . mometasone (NASONEX) 50 MCG/ACT nasal spray Place 2 sprays into  the nose daily.  . Multiple Vitamins-Minerals (PRESERVISION AREDS 2) CAPS Take 2 capsules by mouth daily.  . Omega-3 Fatty Acids (FISH OIL) 1000 MG CAPS Take 2,000 mg by mouth daily.  . Red Yeast Rice Extract (RED YEAST RICE PO) Take 2 tablets by mouth daily.  . [DISCONTINUED] azithromycin (ZITHROMAX) 250 MG tablet Take 1 tablet (250 mg total) by mouth daily. Take 1 every day until finished.  . [DISCONTINUED] HYDROcodone-homatropine (HYCODAN) 5-1.5 MG/5ML syrup Take 5 mLs by mouth every 6 (six) hours as needed for cough. (Patient not taking: Reported on 11/24/2015)  . [DISCONTINUED] oxymetazoline (AFRIN NASAL SPRAY) 0.05 % nasal spray Place 1 spray into both nostrils 2 (two) times daily. Used a more than 3 days  . [DISCONTINUED] predniSONE (STERAPRED UNI-PAK 48 TAB) 10 MG (48) TBPK tablet Take 1 tablet by mouth daily.   No facility-administered encounter medications on file as of 12/05/2015.  :  Review of Systems:  Out of a complete 14 point review of systems, all are reviewed and negative with the exception of these symptoms as listed below:   Review of Systems  Neurological:       Patient feels that he is doing well on CPAP. No new concerns.     Objective:  Neurologic Exam  Physical Exam Physical Examination:   Filed Vitals:   12/05/15 1114  BP: 114/68  Pulse: 52  Resp: 16   General Examination: The patient is a very pleasant 80 y.o. male in no acute distress. He appears well-developed and well-nourished and well groomed.   HEENT: Normocephalic, atraumatic, pupils are equal, round and reactive to light and accommodation. He is status post bilateral cataract repairs. Extraocular tracking is good without limitation to gaze excursion or nystagmus noted. Normal smooth pursuit is noted. Hearing is grossly intact. Face is symmetric with normal facial animation and normal facial sensation. Speech is clear with no dysarthria noted. There  is no hypophonia. There is no lip, neck/head, jaw or  voice tremor. Neck is supple with full range of passive and active motion. There are no carotid bruits on auscultation. Oropharynx exam reveals: mild mouth dryness, adequate dental hygiene and moderate airway crowding, due to narrow airway and thicker tongue and redundant soft palate. Mallampati is class II. Tongue protrudes centrally and palate elevates symmetrically. Tonsils are absent.   Chest: Clear to auscultation without wheezing, rhonchi or crackles noted.  Heart: S1+S2+0, regular and normal without murmurs, rubs or gallops noted.   Abdomen: Soft, non-tender and non-distended with normal bowel sounds appreciated on auscultation.  Extremities: There is no pitting edema in the distal lower extremities bilaterally. Pedal pulses are intact.  Skin: Warm and dry without trophic changes noted. There are no varicose veins.  Musculoskeletal: exam reveals no obvious joint deformities, tenderness or joint swelling or erythema.    Neurologically:  Mental status: The patient is awake, alert and oriented in all 4 spheres. His immediate and remote memory, attention, language skills and fund of knowledge are appropriate. There is no evidence of aphasia, agnosia, apraxia or anomia. Speech is clear with normal prosody and enunciation. Thought process is linear. Mood is normal and affect is normal.  Cranial nerves II - XII are as described above under HEENT exam. In addition: shoulder shrug is normal with equal shoulder height noted. Motor exam: Normal bulk, strength and tone is noted. There is no drift, tremor or rebound. Romberg is negative. Reflexes are 1+ throughout. Fine motor skills and coordination: intact.  Sensory exam: intact to light touch  in the upper and lower extremities.  Gait, station and balance: He stands easily. No veering to one side is noted. No leaning to one side is noted. Posture is age-appropriate and stance is narrow based. Gait shows normal stride length and normal pace. No  problems turning are noted. He turns en bloc.    Assessment and Plan:   In summary, Jose [name]. is a very pleasant 80 year old male with an underlying medical history of new onset A. fib, obesity, reflux disease, recurrent TIA, hypertension, MGUS, ulnar neuropathy who presents for follow-up consultation of his severe obstructive sleep apnea. He had a split-night sleep study in May 2016 and has been compliant with CPAP therapy. He is commended for his excellent compliance. He has also noted improvement in his sleep with better sleep consolidation, less daytime somnolence, better sleep quality and waking up better rested. We briefly talked about his previous test results again today and reviewed his recent compliance data. He is motivated to continue with treatment. He has an appointment with Dr. Jannifer Franklin next month. From my end of things he is doing well. I suggested a one-year checkup for sleep apnea. I answered all his questions today and he was in agreement. I again explained the importance of being compliant with PAP treatment, not only for insurance purposes but primarily to improve His symptoms, and for the patient's long term health benefit, including to reduce His cardiovascular risks. I spent 15 minutes in total face-to-face time with the patient, more than 50% of which was spent in counseling and coordination of care, reviewing test results, reviewing medication and discussing or reviewing the diagnosis of OSA, its prognosis and treatment options.

## 2016-01-24 ENCOUNTER — Encounter: Payer: Self-pay | Admitting: Neurology

## 2016-01-24 ENCOUNTER — Ambulatory Visit (INDEPENDENT_AMBULATORY_CARE_PROVIDER_SITE_OTHER): Payer: Medicare Other | Admitting: Neurology

## 2016-01-24 VITALS — BP 121/83 | HR 56 | Ht 72.0 in | Wt 229.0 lb

## 2016-01-24 DIAGNOSIS — R202 Paresthesia of skin: Secondary | ICD-10-CM | POA: Diagnosis not present

## 2016-01-24 DIAGNOSIS — G609 Hereditary and idiopathic neuropathy, unspecified: Secondary | ICD-10-CM | POA: Diagnosis not present

## 2016-01-24 DIAGNOSIS — G5622 Lesion of ulnar nerve, left upper limb: Secondary | ICD-10-CM | POA: Diagnosis not present

## 2016-01-24 MED ORDER — TRAMADOL HCL 50 MG PO TABS: 50.0000 mg | ORAL_TABLET | Freq: Four times a day (QID) | ORAL | Status: DC | PRN

## 2016-01-24 MED FILL — traMADol HCL 50 MG TABS: 50 | 15 days supply | Qty: 60 | Fill #0

## 2016-01-24 NOTE — Patient Instructions (Signed)
Paresthesia Paresthesia is an abnormal burning or prickling sensation. This sensation is generally felt in the hands, arms, legs, or feet. However, it may occur in any part of the body. Usually, it is not painful. The feeling may be described as:  Tingling or numbness.  Pins and needles.  Skin crawling.  Buzzing.  Limbs falling asleep.  Itching. Most people experience temporary (transient) paresthesia at some time in their lives. Paresthesia may occur when you breathe too quickly (hyperventilation). It can also occur without any apparent cause. Commonly, paresthesia occurs when pressure is placed on a nerve. The sensation quickly goes away after the pressure is removed. For some people, however, paresthesia is a long-lasting (chronic) condition that is caused by an underlying disorder. If you continue to have paresthesia, you may need further medical evaluation. HOME CARE INSTRUCTIONS Watch your condition for any changes. Taking the following actions may help to lessen any discomfort that you are feeling:  Avoid drinking alcohol.  Try acupuncture or massage to help relieve your symptoms.  Keep all follow-up visits as directed by your health care provider. This is important. SEEK MEDICAL CARE IF:  You continue to have episodes of paresthesia.  Your burning or prickling feeling gets worse when you walk.  You have pain, cramps, or dizziness.  You develop a rash. SEEK IMMEDIATE MEDICAL CARE IF:  You feel weak.  You have trouble walking or moving.  You have problems with speech, understanding, or vision.  You feel confused.  You cannot control your bladder or bowel movements.  You have numbness after an injury.  You faint.   This information is not intended to replace advice given to you by your health care provider. Make sure you discuss any questions you have with your health care provider.   Document Released: 10/05/2002 Document Revised: 03/01/2015 Document Reviewed:  10/11/2014 Elsevier Interactive Patient Education 2016 Elsevier Inc.  

## 2016-01-24 NOTE — Progress Notes (Signed)
Reason for visit: Arm and leg discomfort  Jose [name]. is a 80 y.o. male  History of present illness:  Jose [name] is a 80 year old right-handed white male with a history of a peripheral neuropathy associated with minimal gait instability. He has no real discomfort in the feet from the neuropathy. He is followed through this office as well for obstructive sleep apnea. He reports that in January 2017 he was playing with his grandson, grabbing him as he ran past. The patient developed bilateral shoulder and arm discomfort, some pain going down the arm bilaterally. The patient has known significant arthritis in both shoulders. He is followed by Dr. Noemi [name] for this. The patient received shots in the shoulders which seemed to have helped the arm discomfort. The patient denies any significant neck pain, he has had prior neck surgery in the spring of 2016 with fusion at the C4-5 level. The patient more recently has developed some low back pain with shock sensations going down the legs bilaterally, right greater than left. This is a daily occurrence. He indicates the flexion of the low back may bring on the pain. He denies any issues with controlling the bowels or the bladder, he has not had any change in balance issues. He has also had a left ulnar nerve transposition done about 6 months ago, he has not regained any strength or sensation in the hand following the surgery. This was done by Dr. Burney [name].  Past Medical History  Diagnosis Date  . History of CT scan of head 1997    small vessel disease  . Gallstones 07/1996  . History of ETT 05/2002     no EKG changes  . History of MRI of cervical spine 07/15/2002  . Rectal bleeding 12/2002    colonoscopy  . Hypertension   . MGUS (monoclonal gammopathy of unknown significance) 04/11/2012  . Skin cancer   . Seizures ([place])     last seizure 1976  . TIA (transient ischemic attack)   . Peripheral neuropathy ([place])   . Ulnar neuropathy at elbow  12/24/2014    Left  . Stroke [facility])     ?tia ?afib  . Dysrhythmia     afib  . Pneumonia 10/2014  . History of hiatal hernia     3 over period 6 yrs, last 79  studies no dx, no rx  . Arthritis   . Sleep apnea     ? to have study after surgery  . 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 Skin cancer excision  07/1993    left lateral arm  . Keratosis excision Right 05/1998    wrist  . Ulnar nerve transfer  12/1999    left  . Colonoscopy w/ polypectomy  04/19/2003    tubular adenoma  . Shoulder injection  June 2012    left, [place]  . Thumb injection  June 2012    left, [place]  . Great toe arthrodesis, interphalangeal joint Left 1978  . Tonsillectomy    . Adenoidectomy    . Cataract extraction Bilateral   . Shoulder arthroscopy Left 15    rotator cuff   . Anterior cervical decomp/discectomy fusion N/A 02/04/2015    Procedure: ANTERIOR CERVICAL DECOMPRESSION/DISCECTOMY FUSION CERVICAL FIVE-SIX,CERVICAL SIX-SEVEN;  Surgeon: Jose Chimera, MD;  Location: [place] [facility];  Service: Neurosurgery;  Laterality: N/A;    Family History  Problem Relation Age of Onset  .  Dementia Mother 49    vascular  . Transient ischemic attack Mother   . Dementia Father     after hip fracture  . Hypertension Sister     skin cancer  . Seizures Neg Hx     Social history:  reports that he quit smoking about 47 years ago. He has never used smokeless tobacco. He reports that he drinks about 1.2 oz of alcohol per week. He reports that he does not use illicit drugs.  Medications:  Prior to Admission medications   Medication Sig Start Date End Date Taking? Authorizing Provider  amLODipine (NORVASC) 5 MG tablet Take 5 mg by mouth daily.   Yes Historical Provider, MD  ELIQUIS 5 MG TABS tablet TAKE 1 TABLET TWICE A DAY 05/17/15  Yes Kathrynn Ducking, MD  fluticasone [facility]) 50 MCG/ACT nasal spray Place 1 spray into both nostrils daily as  needed for allergies.    Yes Historical Provider, MD  labetalol (NORMODYNE) 200 MG tablet Take 100 mg by mouth 2 (two) times daily.   Yes Historical Provider, MD  losartan-hydrochlorothiazide (HYZAAR) 50-12.5 MG per tablet Take 1 tablet by mouth 2 (two) times daily.   Yes Historical Provider, MD  Multiple Vitamins-Minerals (PRESERVISION AREDS 2) CAPS Take 2 capsules by mouth daily.   Yes Historical Provider, MD  Omega-3 Fatty Acids (FISH OIL) 1000 MG CAPS Take 2,000 mg by mouth daily.   Yes Historical Provider, MD  Red Yeast Rice Extract (RED YEAST RICE PO) Take 2 tablets by mouth daily.   Yes Historical Provider, MD      Allergies  Allergen Reactions  . Tetracycline Hcl Rash    ROS:  Out of a complete 14 system review of symptoms, the patient complains only of the following symptoms, and all other reviewed systems are negative.  Joint pain, back pain, achy muscles, walking difficulty, neck pain, neck stiffness Numbness, weakness  Blood pressure 121/83, pulse 56, height 6' (1.829 m), weight 229 lb (103.874 kg).  Physical Exam  General: The patient is alert and cooperative at the time of the examination.  Eyes: Pupils are equal, round, and reactive to light. Discs are flat bilaterally.  Neck: The neck is supple, no carotid bruits are noted.  Respiratory: The respiratory examination is clear.  Cardiovascular: The cardiovascular examination reveals a regular rate and rhythm, no obvious murmurs or rubs are noted.   Neuromuscular: Range of movement of the cervical spine lacks about 35-40 of lateral rotation bilaterally. The patient has some discomfort with passive elevation and rotational movements of the shoulders bilaterally.  Skin: Extremities are without significant edema.  Neurologic Exam  Mental status: The patient is alert and oriented x 3 at the time of the examination. The patient has apparent normal recent and remote memory, with an apparently normal attention span and  concentration ability.  Cranial nerves: Facial symmetry is present. There is good sensation of the face to pinprick and soft touch bilaterally. The strength of the facial muscles and the muscles to head turning and shoulder shrug are normal bilaterally. Speech is well enunciated, no aphasia or dysarthria is noted. Extraocular movements are full. Visual fields are full. The tongue is midline, and the patient has symmetric elevation of the soft palate. No obvious hearing deficits are noted.  Motor: The motor testing reveals 5 over 5 strength of all 4 extremities, with exception of some weakness with the intrinsic muscles of the left hand, atrophy of the right first dorsal and Ross's muscle. Weakness with  flexion of the distal fifth finger is noted on the left.Kermit Balo symmetric motor tone is noted throughout.  Sensory: Sensory testing is intact to pinprick, soft touch, vibration sensation, and position sense on the upper extremities. With the lower extremity is, there is a stocking pattern pinprick sensory deficit up to the knees bilaterally, significant impairment of vibration sensation in both feet. Position sensation is slightly decreased in both feet. No evidence of extinction is noted.  Coordination: Cerebellar testing reveals good finger-nose-finger and heel-to-shin bilaterally.  Gait and station: Gait is normal. Tandem gait is slightly unsteady. Romberg is negative. No drift is seen.  Reflexes: Deep tendon reflexes are symmetric, but are depressed bilaterally. Toes are downgoing bilaterally.   Assessment/Plan:  1. Bilateral arm pain, degenerative arthritis of the shoulders  2. Cervical spondylosis  3. Peripheral neuropathy  4. Leg discomfort, right hip pain  The patient has had new discomfort in the legs with shock sensations into the legs, and some right hip pain. The patient will be set up for MRI of the lumbar spine. The patient has arm pain that is likely related to the shoulder  arthritis. He has had a left ulnar neuropathy with a transposition procedure, he has not regained strength or sensation. The patient was given a prescription for Ultram for pain. I will contact him with the results of the MRI study.  Jill Alexanders MD 01/24/2016 7:48 PM  [facility] [address] [place], [place] [address]  Phone [phone] Fax [phone]

## 2016-02-02 ENCOUNTER — Ambulatory Visit
Admission: RE | Admit: 2016-02-02 | Discharge: 2016-02-02 | Disposition: A | Discharge status: Home / Self Care | Payer: Medicare Other | Source: Ambulatory Visit | Attending: Neurology | Admitting: Neurology

## 2016-02-02 DIAGNOSIS — R202 Paresthesia of skin: Secondary | ICD-10-CM | POA: Diagnosis not present

## 2016-02-02 DIAGNOSIS — G5622 Lesion of ulnar nerve, left upper limb: Secondary | ICD-10-CM

## 2016-02-02 DIAGNOSIS — G609 Hereditary and idiopathic neuropathy, unspecified: Secondary | ICD-10-CM

## 2016-02-03 ENCOUNTER — Telehealth: Payer: Self-pay | Admitting: Neurology

## 2016-02-03 MED ORDER — GABAPENTIN 300 MG PO CAPS: 300.0000 mg | ORAL_CAPSULE | Freq: Two times a day (BID) | ORAL | Status: DC

## 2016-02-03 NOTE — Telephone Encounter (Signed)
   I called the patient. The MRI of the low back shows multilevel NF stenosis Left>right. Will restart gabapentin. The leg pain started around the time that the gabapentin was stopped.   MRI lumbar 02/03/16:  IMPRESSION: This MRI of the lumbar spine shows severe multilevel degenerative changes as detailed above. The most significant findings are: 1. At L2-L3, there is moderately severe spinal stenosis due to disc protrusion, facet hypertrophy and ligamentum flavum hypertrophy. There is severe left lateral recess stenosis that could lead to left L3 nerve root compression. 2. At L3-L4, there is mild spinal stenosis due to loss of disc height, disc bulging, facet and ligamentum flavum hypertrophy and endplate spurring. There is moderately severe left lateral recess stenosis that could lead to left L4 nerve root compression. There is also encroachment upon both L3 nerve roots with less potential for compression. 3. At L4-L5, loss of disc height, severe facet hypertrophy, ligamentum flavum hypertrophy and endplate spurring causes severe right and moderately severe left foraminal narrowing and moderate lateral recess stenosis. There is probable right L4 and possible left L4 nerve root compression. There is also some encroachment upon the traversing L5 nerve roots with less potential for compression. 4. At L5-S1, there are lateral disc protrusions and severe facet hypertrophy causing moderately severe foraminal narrowing that could lead to left and right L5 nerve root compression.

## 2016-02-06 MED FILL — GABAPENTIN 300 MG CAPSULE: 300 | 30 days supply | Qty: 60 | Fill #0

## 2016-02-22 ENCOUNTER — Emergency Department (HOSPITAL_BASED_OUTPATIENT_CLINIC_OR_DEPARTMENT_OTHER)
Admission: EM | Admit: 2016-02-22 | Discharge: 2016-02-22 | Disposition: A | Discharge status: Home / Self Care | Payer: Medicare Other | Attending: Emergency Medicine | Admitting: Emergency Medicine

## 2016-02-22 ENCOUNTER — Emergency Department (HOSPITAL_BASED_OUTPATIENT_CLINIC_OR_DEPARTMENT_OTHER): Payer: Medicare Other

## 2016-02-22 ENCOUNTER — Encounter (HOSPITAL_BASED_OUTPATIENT_CLINIC_OR_DEPARTMENT_OTHER): Payer: Self-pay | Admitting: *Deleted

## 2016-02-22 ENCOUNTER — Emergency Department (HOSPITAL_COMMUNITY): Payer: Medicare Other

## 2016-02-22 DIAGNOSIS — R42 Dizziness and giddiness: Secondary | ICD-10-CM | POA: Diagnosis present

## 2016-02-22 DIAGNOSIS — H81393 Other peripheral vertigo, bilateral: Secondary | ICD-10-CM | POA: Insufficient documentation

## 2016-02-22 DIAGNOSIS — I4891 Unspecified atrial fibrillation: Secondary | ICD-10-CM | POA: Diagnosis not present

## 2016-02-22 DIAGNOSIS — I10 Essential (primary) hypertension: Secondary | ICD-10-CM | POA: Insufficient documentation

## 2016-02-22 DIAGNOSIS — Z8673 Personal history of transient ischemic attack (TIA), and cerebral infarction without residual deficits: Secondary | ICD-10-CM | POA: Insufficient documentation

## 2016-02-22 DIAGNOSIS — Z87891 Personal history of nicotine dependence: Secondary | ICD-10-CM | POA: Diagnosis not present

## 2016-02-22 DIAGNOSIS — Z79899 Other long term (current) drug therapy: Secondary | ICD-10-CM | POA: Insufficient documentation

## 2016-02-22 DIAGNOSIS — Z7901 Long term (current) use of anticoagulants: Secondary | ICD-10-CM | POA: Diagnosis not present

## 2016-02-22 LAB — CBC WITH DIFFERENTIAL/PLATELET
BASOS ABS: 0 10*3/uL (ref 0.0–0.1)
BASOS PCT: 0 %
EOS ABS: 0 10*3/uL (ref 0.0–0.7)
EOS PCT: 0 %
HCT: 39.1 % (ref 39.0–52.0)
Hemoglobin: 13.4 g/dL (ref 13.0–17.0)
Lymphocytes Relative: 16 %
Lymphs Abs: 1 10*3/uL (ref 0.7–4.0)
MCH: 32.4 pg (ref 26.0–34.0)
MCHC: 34.3 g/dL (ref 30.0–36.0)
MCV: 94.4 fL (ref 78.0–100.0)
MONO ABS: 0.2 10*3/uL (ref 0.1–1.0)
Monocytes Relative: 3 %
Neutro Abs: 5 10*3/uL (ref 1.7–7.7)
Neutrophils Relative %: 81 %
PLATELETS: 218 10*3/uL (ref 150–400)
RBC: 4.14 MIL/uL — AB (ref 4.22–5.81)
RDW: 12.9 % (ref 11.5–15.5)
WBC: 6.2 10*3/uL (ref 4.0–10.5)

## 2016-02-22 LAB — BASIC METABOLIC PANEL
ANION GAP: 8 (ref 5–15)
BUN: 22 mg/dL — ABNORMAL HIGH (ref 6–20)
CALCIUM: 9.3 mg/dL (ref 8.9–10.3)
CO2: 27 mmol/L (ref 22–32)
Chloride: 100 mmol/L — ABNORMAL LOW (ref 101–111)
Creatinine, Ser: 0.73 mg/dL (ref 0.61–1.24)
GLUCOSE: 172 mg/dL — AB (ref 65–99)
POTASSIUM: 3.3 mmol/L — AB (ref 3.5–5.1)
SODIUM: 135 mmol/L (ref 135–145)

## 2016-02-22 LAB — TROPONIN I

## 2016-02-22 MED ORDER — MECLIZINE HCL 25 MG PO TABS: 25.0000 mg | ORAL_TABLET | Freq: Three times a day (TID) | ORAL | Status: DC | PRN

## 2016-02-22 MED ORDER — MECLIZINE HCL 25 MG PO TABS
25.0000 mg | ORAL_TABLET | Freq: Once | ORAL | Status: AC
  Administered 2016-02-22: 25 mg via ORAL
  Filled 2016-02-22: qty 1

## 2016-02-22 MED ORDER — DIAZEPAM 5 MG/ML IJ SOLN
5.0000 mg | Freq: Once | INTRAMUSCULAR | Status: AC
  Administered 2016-02-22: 5 mg via INTRAVENOUS
  Filled 2016-02-22: qty 2

## 2016-02-22 MED ORDER — DIAZEPAM 5 MG PO TABS: ORAL_TABLET | ORAL | Status: DC

## 2016-02-22 NOTE — ED Provider Notes (Signed)
Recent seen and evaluated. Patient seen initially by my partner Dr. Alfonse Spruce. Transferred from that [place] with symptoms of vertigo without preposition. Has some horizontal nystagmus with upright position. No symptoms supine. No other neurological findings. Cranial nerves otherwise intact. Normal peripheral neurological strength and sensation.  MRI shows no acute process. Plan is discharge home. Antivert until 24 hours without symptoms. When necessary Valium. ER with worsening symptoms. Primary care follow-up. Driving precautions given.  Tanna Furry, MD 02/22/16 2049

## 2016-02-22 NOTE — ED Notes (Signed)
C/o dizziness with getting out of bed this am around 0645. States he gets up to standing position rather fast and has had dizziness in doing this but it did not pass this am. Nausea, no vomiting.

## 2016-02-22 NOTE — Discharge Instructions (Signed)
Benign Positional Vertigo Vertigo is the feeling that you or your surroundings are moving when they are not. Benign positional vertigo is the most common form of vertigo. The cause of this condition is not serious (is benign). This condition is triggered by certain movements and positions (is positional). This condition can be dangerous if it occurs while you are doing something that could endanger you or others, such as driving.  CAUSES In many cases, the cause of this condition is not known. It may be caused by a disturbance in an area of the inner ear that helps your brain to sense movement and balance. This disturbance can be caused by a viral infection (labyrinthitis), head injury, or repetitive motion. RISK FACTORS This condition is more likely to develop in:  Women.  People who are 50 years of age or older. SYMPTOMS Symptoms of this condition usually happen when you move your head or your eyes in different directions. Symptoms may start suddenly, and they usually last for less than a minute. Symptoms may include:  Loss of balance and falling.  Feeling like you are spinning or moving.  Feeling like your surroundings are spinning or moving.  Nausea and vomiting.  Blurred vision.  Dizziness.  Involuntary eye movement (nystagmus). Symptoms can be mild and cause only slight annoyance, or they can be severe and interfere with daily life. Episodes of benign positional vertigo may return (recur) over time, and they may be triggered by certain movements. Symptoms may improve over time. DIAGNOSIS This condition is usually diagnosed by medical history and a physical exam of the head, neck, and ears. You may be referred to a health care provider who specializes in ear, nose, and throat (ENT) problems (otolaryngologist) or a provider who specializes in disorders of the nervous system (neurologist). You may have additional testing, including:  MRI.  A CT scan.  Eye movement tests. Your  health care provider may ask you to change positions quickly while he or she watches you for symptoms of benign positional vertigo, such as nystagmus. Eye movement may be tested with an electronystagmogram (ENG), caloric stimulation, the Dix-Hallpike test, or the roll test.  An electroencephalogram (EEG). This records electrical activity in your brain.  Hearing tests. TREATMENT Usually, your health care provider will treat this by moving your head in specific positions to adjust your inner ear back to normal. Surgery may be needed in severe cases, but this is rare. In some cases, benign positional vertigo may resolve on its own in 2-4 weeks. HOME CARE INSTRUCTIONS Safety  Move slowly.Avoid sudden body or head movements.  Avoid driving.  Avoid operating heavy machinery.  Avoid doing any tasks that would be dangerous to you or others if a vertigo episode would occur.  If you have trouble walking or keeping your balance, try using a cane for stability. If you feel dizzy or unstable, sit down right away.  Return to your normal activities as told by your health care provider. Ask your health care provider what activities are safe for you. General Instructions  Take over-the-counter and prescription medicines only as told by your health care provider.  Avoid certain positions or movements as told by your health care provider.  Drink enough fluid to keep your urine clear or pale yellow.  Keep all follow-up visits as told by your health care provider. This is important. SEEK MEDICAL CARE IF:  You have a fever.  Your condition gets worse or you develop new symptoms.  Your family or friends   notice any behavioral changes.  Your nausea or vomiting gets worse.  You have numbness or a "pins and needles" sensation. SEEK IMMEDIATE MEDICAL CARE IF:  You have difficulty speaking or moving.  You are always dizzy.  You faint.  You develop severe headaches.  You have weakness in your  legs or arms.  You have changes in your hearing or vision.  You develop a stiff neck.  You develop sensitivity to light.   This information is not intended to replace advice given to you by your health care provider. Make sure you discuss any questions you have with your health care provider.   Document Released: 07/23/2006 Document Revised: 07/06/2015 Document Reviewed: 02/07/2015 Elsevier Interactive Patient Education 2016 [employer] Inc.  

## 2016-02-22 NOTE — ED Provider Notes (Signed)
CSN: [id number]     Arrival date & time 02/22/16  1230 History   First MD Initiated Contact with Patient 02/22/16 1248     No chief complaint on file.    (Consider location/radiation/quality/duration/timing/severity/associated sxs/prior Treatment) HPI Comments: 80 year old male with history of atrial fibrillation on Eliquis presents for dizziness. The patient states that he felt relatively okay initially when he woke up but shortly after developed dizziness that then got worse when he stood up. He reports that the feeling of things moving and feeling off balance. He denies shortness of breath or chest pain with it. He said he does not feel lightheaded like he might pass out. He was seen in his primary care physician's office and noted to have an unsteady gait and continued to be dizzy especially when he was laying down. He was sent here for further evaluation and for stroke rule out.   Past Medical History  Diagnosis Date  . History of CT scan of head 1997    small vessel disease  . Gallstones 07/1996  . History of ETT 05/2002     no EKG changes  . History of MRI of cervical spine 07/15/2002  . Rectal bleeding 12/2002    colonoscopy  . Hypertension   . MGUS (monoclonal gammopathy of unknown significance) 04/11/2012  . Skin cancer   . Seizures ([place])     last seizure 1976  . TIA (transient ischemic attack)   . Peripheral neuropathy ([place])   . 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 Stroke [facility])     ?tia ?afib  . Dysrhythmia     afib  . Pneumonia 10/2014  . History of hiatal hernia     3 over period 6 yrs, last 75  studies no dx, no rx  . Arthritis   . Sleep apnea     ? to have study after surgery  . 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 Skin cancer excision  07/1993    left lateral arm  . Keratosis excision Right 05/1998    wrist  . Ulnar nerve transfer  12/1999    left  . Colonoscopy w/  polypectomy  04/19/2003    tubular adenoma  . Shoulder injection  June 2012    left, [place]  . Thumb injection  June 2012    left, [place]  . Great toe arthrodesis, interphalangeal joint Left 1978  . Tonsillectomy    . Adenoidectomy    . Cataract extraction Bilateral   . Shoulder arthroscopy Left 15    rotator cuff   . Anterior cervical decomp/discectomy fusion N/A 02/04/2015    Procedure: ANTERIOR CERVICAL DECOMPRESSION/DISCECTOMY FUSION CERVICAL FIVE-SIX,CERVICAL SIX-SEVEN;  Surgeon: Karie Chimera, MD;  Location: [facility];  Service: Neurosurgery;  Laterality: N/A;   Family History  Problem Relation Age of Onset  . Dementia Mother 64    vascular  . Transient ischemic attack Mother   . Dementia Father     after hip fracture  . Hypertension Sister     skin cancer  . Seizures Neg Hx    Social History  Substance Use Topics  . Smoking status: Former Smoker -- 15 years    Quit date: 03/05/1968  . Smokeless tobacco: Never Used  . Alcohol Use: 1.2 oz/week    2 Standard drinks or equivalent per week    Review of Systems  Constitutional: Negative for fever, chills  and fatigue.  HENT: Negative for congestion, rhinorrhea and sinus pressure.   Eyes: Negative for photophobia and visual disturbance.  Respiratory: Negative for cough, chest tightness and shortness of breath.   Cardiovascular: Negative for chest pain, palpitations and leg swelling.  Gastrointestinal: Negative for nausea, vomiting, abdominal pain, diarrhea and constipation.  Genitourinary: Negative for dysuria, frequency and hematuria.  Musculoskeletal: Negative for myalgias and back pain.  Skin: Negative for rash.  Neurological: Positive for dizziness. Negative for seizures, syncope, speech difficulty, weakness, light-headedness, numbness and headaches.  Hematological: Bruises/bleeds easily.      Allergies  Tetracycline hcl  Home Medications   Prior to Admission medications   Medication Sig Start Date End Date  Taking? Authorizing Provider  amLODipine (NORVASC) 5 MG tablet Take 5 mg by mouth daily.   Yes Historical Provider, MD  ELIQUIS 5 MG TABS tablet TAKE 1 TABLET TWICE A DAY 05/17/15  Yes Kathrynn Ducking, MD  fluticasone [facility]) 50 MCG/ACT nasal spray Place 1 spray into both nostrils daily as needed for allergies.    Yes Historical Provider, MD  labetalol (NORMODYNE) 200 MG tablet Take 100 mg by mouth 2 (two) times daily.   Yes Historical Provider, MD  losartan-hydrochlorothiazide (HYZAAR) 50-12.5 MG per tablet Take 1 tablet by mouth 2 (two) times daily.   Yes Historical Provider, MD  Multiple Vitamins-Minerals (PRESERVISION AREDS 2) CAPS Take 2 capsules by mouth daily.   Yes Historical Provider, MD  Omega-3 Fatty Acids (FISH OIL) 1000 MG CAPS Take 2,000 mg by mouth daily.   Yes Historical Provider, MD  Red Yeast Rice Extract (RED YEAST RICE PO) Take 2 tablets by mouth daily.   Yes Historical Provider, MD  traMADol (ULTRAM) 50 MG tablet Take 1 tablet (50 mg total) by mouth every 6 (six) hours as needed. 01/24/16  Yes Kathrynn Ducking, MD  gabapentin (NEURONTIN) 300 MG capsule Take 1 capsule (300 mg total) by mouth 2 (two) times daily. 02/03/16   Kathrynn Ducking, MD   BP 116/74 mmHg  Pulse 67  Temp(Src) 97.5 F (36.4 C) (Oral)  Resp 13  Ht 6' (1.829 m)  Wt 220 lb (99.791 kg)  BMI 29.83 kg/m2  SpO2 94% Physical Exam  Constitutional: He is oriented to person, place, and time. He appears well-developed and well-nourished. No distress.  HENT:  Head: Normocephalic and atraumatic.  Right Ear: External ear normal.  Left Ear: External ear normal.  Mouth/Throat: Oropharynx is clear and moist. No oropharyngeal exudate.  Eyes: Pupils are equal, round, and reactive to light. Right eye exhibits nystagmus (horizontal). Left eye exhibits nystagmus (Horizontal).  Neck: Normal range of motion. Neck supple.  Cardiovascular: Normal rate, normal heart sounds and intact distal pulses.  An irregularly irregular  rhythm present.  No murmur heard. Pulmonary/Chest: Effort normal. No respiratory distress. He has no wheezes. He has no rales.  Abdominal: Soft. He exhibits no distension. There is no tenderness.  Musculoskeletal: He exhibits no edema.  Neurological: He is alert and oriented to person, place, and time. He has normal strength. No cranial nerve deficit or sensory deficit.  Normal finger to nose and heel to shin examinations bilaterally. Patient with unsteady gait with slight leaning to the left although able to ambulate.  Skin: Skin is warm and dry. No rash noted. He is not diaphoretic.  Vitals reviewed.   ED Course  Procedures (including critical care time) Labs Review Labs Reviewed  CBC WITH DIFFERENTIAL/PLATELET - Abnormal; Notable for the following:    RBC  4.14 (*)    All other components within normal limits  BASIC METABOLIC PANEL - Abnormal; Notable for the following:    Potassium 3.3 (*)    Chloride 100 (*)    Glucose, Bld 172 (*)    BUN 22 (*)    All other components within normal limits  TROPONIN I    Imaging Review Ct Head Wo Contrast  02/22/2016  CLINICAL DATA:  Dizziness getting out of bed.  Nausea, vomiting. EXAM: CT HEAD WITHOUT CONTRAST TECHNIQUE: Contiguous axial images were obtained from the base of the skull through the vertex without intravenous contrast. COMPARISON:  MRI 11/24/2015 FINDINGS: No acute intracranial abnormality. Specifically, no hemorrhage, hydrocephalus, mass lesion, acute infarction, or significant intracranial injury. No acute calvarial abnormality. Visualized paranasal sinuses and mastoids clear. Orbital soft tissues unremarkable. IMPRESSION: No acute intracranial abnormality. Electronically Signed   By: Rolm Baptise M.D.   On: 02/22/2016 14:09   I have personally reviewed and evaluated these images and lab results as part of my medical decision-making.   EKG Interpretation   Date/Time:  Wednesday February 22 2016 13:34:09 EDT Ventricular Rate:   64 PR Interval:  202 QRS Duration: 99 QT Interval:  428 QTC Calculation: 442 R Axis:   -12 Text Interpretation:  Sinus rhythm Low voltage, precordial leads Left  ventricular hypertrophy Borderline T abnormalities, diffuse leads No  significant change since last tracing Confirmed by Taylia Berber ([id number])  on 02/22/2016 2:11:08 PM      MDM  Patient was seen and evaluated in stable condition.  Patient without sign of frank ataxia on exam although mildly unsteady gait. Patient with continued dizziness in the emergency department. He had taken meclizine without improvement prior to presentation. We'll give Valium. CT head unremarkable. Laboratory studies unremarkable. EKG without acute change from his previous. Discussed case with Dr. Leonel Ramsay who agreed with need for MRI for further evaluation at this time. Discussed this with the patient and his wife who were agreeable with transfer to [facility] for [facility] completion. I discussed with Dr. Canary Brim who agreed as the accepting physician over at [facility]. Plan for patient to undergo MRI to rule out posterior stroke. If normal patient can likely be discharged if abnormal neuro needs to be reconsult it. Final diagnoses:  None    1. Dizziness    Harvel Quale, MD 02/22/16 1501

## 2016-02-23 ENCOUNTER — Ambulatory Visit: Payer: Medicare Other | Admitting: Pulmonary Disease

## 2016-02-23 MED FILL — diazePAM 5 MG TABS: 5 | 5 days supply | Qty: 10 | Fill #0

## 2016-02-23 MED FILL — MECLIZINE 25 MG TABLET: 25 | 6 days supply | Qty: 20 | Fill #0

## 2016-03-30 MED FILL — MECLIZINE 25 MG TABLET: 25 | 3 days supply | Qty: 10 | Fill #0

## 2016-06-26 ENCOUNTER — Other Ambulatory Visit: Payer: Self-pay | Admitting: Neurology

## 2016-07-18 ENCOUNTER — Other Ambulatory Visit: Payer: Self-pay | Admitting: Orthopedic Surgery

## 2016-07-18 DIAGNOSIS — M25512 Pain in left shoulder: Principal | ICD-10-CM

## 2016-07-18 DIAGNOSIS — M25511 Pain in right shoulder: Secondary | ICD-10-CM

## 2016-07-24 ENCOUNTER — Ambulatory Visit
Admission: RE | Admit: 2016-07-24 | Discharge: 2016-07-24 | Disposition: A | Discharge status: Home / Self Care | Payer: Medicare Other | Source: Ambulatory Visit | Attending: Orthopedic Surgery | Admitting: Orthopedic Surgery

## 2016-07-24 ENCOUNTER — Other Ambulatory Visit: Payer: Medicare Other

## 2016-07-24 DIAGNOSIS — M25511 Pain in right shoulder: Secondary | ICD-10-CM

## 2016-07-24 DIAGNOSIS — M25512 Pain in left shoulder: Principal | ICD-10-CM

## 2016-07-25 ENCOUNTER — Encounter: Payer: Self-pay | Admitting: Pulmonary Disease

## 2016-07-25 ENCOUNTER — Ambulatory Visit (INDEPENDENT_AMBULATORY_CARE_PROVIDER_SITE_OTHER): Payer: Medicare Other | Admitting: Pulmonary Disease

## 2016-07-25 DIAGNOSIS — Z23 Encounter for immunization: Secondary | ICD-10-CM | POA: Diagnosis not present

## 2016-07-25 DIAGNOSIS — J479 Bronchiectasis, uncomplicated: Secondary | ICD-10-CM

## 2016-07-25 NOTE — Assessment & Plan Note (Signed)
Jose [name] has very mild basilar bronchiectasis which has been associated with recurrent respiratory infections over the years. 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 However, the findings are quite mild on CT scan and he's been stable for over a year so I think that it's unlikely that this is going to cause any significant long-term problems. His best approach moving for his to practice good hand hygiene and to get a flu shot every year. His other immunizations are up-to-date.  Follow-up with [place] on an as-needed basis Flu shot today

## 2016-07-25 NOTE — Patient Instructions (Signed)
Remember to practice good hand hygiene and the cough and cold season Let us know if he had an episode of a respiratory infection such as bronchitis or pneumonia We'll see you back if you have problems with recurrent infections or breathing Get a flu shot every year

## 2016-07-25 NOTE — Progress Notes (Signed)
Subjective:    Patient ID: Jose [name]., male    DOB: [birth year], 80 y.o.   MRN: [id number]  Synopsis: Referred to the [place] pulmonary in 2016 after hospitalization for pneumonia. He was found to have mild lower lobe bronchiectasis at that point which was felt to be related to childhood infection (he was told he had pertussis which as severe as a child). 3/16 Immunoglobulin profile normal March 2016 CT chest> mild basilar bronchiectasis, complete clearing of pneumonia  HPI Chief Complaint  Patient presents with  . Follow-up    pt notes some SOB with exertion, no other complaints at this time.     Jose [name] says his lungs are doing exceptionally well. He is supposed to have shoulder replacement surgery with the [facility] starting with the left surgery. No bronchitis in the last year, no pneumonia. No breathing difficulty right now. He has been using his CPAP. He will get some sinus congestion with CPAP.   Past Medical History:  Diagnosis Date  . Arthritis   . Dysrhythmia    afib  . Gallstones 07/1996  . History of CT scan of head 1997   small vessel disease  . History of ETT 05/2002    no EKG changes  . History of hiatal hernia    3 over period 6 yrs, last 49  studies no dx, no rx  . History of MRI of cervical spine 07/15/2002  . Hypertension   . MGUS (monoclonal gammopathy of unknown significance) 04/11/2012  . Peripheral neuropathy ([place])   . Pneumonia 10/2014  . Rectal bleeding 12/2002   colonoscopy  . Seizures ([place])    last seizure 1976  . Skin cancer   . Sleep apnea    ? to have study after surgery  . Stroke [facility])    ?tia ?afib  . TIA (transient ischemic attack)   . 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 extremity 03/29/2015      Review of Systems     Objective:   Physical Exam Vitals:   07/25/16 1035  BP: 120/80  Pulse: 61  SpO2: 96%  Weight: 223 lb 3.2 oz (101.2 kg)  Height: 6' (1.829 m)    RA  Gen: well appearing HENT: OP clear, TM's clear, neck supple PULM: few crackles left base that clear, normal percussion CV: RRR, no mgr, trace edema GI: BS+, soft, nontender Derm: no cyanosis or rash Psyche: normal mood and affect  3/16 Immunoglobulin profile normal March 2016 CT chest> very mild basilar bronchiectasis left lower lobe, complete clearing of pneumonia      Assessment & Plan:   Bronchiectasis without acute exacerbation [facility]) Jose [name] has very mild basilar bronchiectasis which has been associated with recurrent respiratory infections over the years. 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 However, the findings are quite mild on CT scan and he's been stable for over a year so I think that it's unlikely that this is going to cause any significant long-term problems. His best approach moving for his to practice good hand hygiene and to get a flu shot every year. His other immunizations are up-to-date.  Follow-up with [place] on an as-needed basis Flu shot today   Updated Medication List Outpatient Encounter Prescriptions as of 07/25/2016  Medication Sig  . amLODipine (NORVASC) 5 MG tablet Take 5 mg by mouth daily.  Marland Kitchen atorvastatin (LIPITOR) 10 MG tablet Take 10 mg by mouth daily.  Marland Kitchen  ELIQUIS 5 MG TABS tablet TAKE 1 TABLET TWICE A DAY  . fluticasone (FLONASE) 50 MCG/ACT nasal spray Place 1 spray into both nostrils daily as needed for allergies.   Marland Kitchen gabapentin (NEURONTIN) 300 MG capsule Take 1 capsule (300 mg total) by mouth 2 (two) times daily.  Marland Kitchen labetalol (NORMODYNE) 200 MG tablet Take 100 mg by mouth 2 (two) times daily.  Marland Kitchen losartan-hydrochlorothiazide (HYZAAR) 50-12.5 MG per tablet Take 1 tablet by mouth 2 (two) times daily.  . meclizine (ANTIVERT) 25 MG tablet Take 1 tablet (25 mg total) by mouth 3 (three) times daily as needed.  . Multiple Vitamins-Minerals (PRESERVISION AREDS 2) CAPS Take 2 capsules by mouth daily.  . Omega-3 Fatty Acids (FISH OIL) 1000 MG  CAPS Take 2,000 mg by mouth daily.  . traMADol (ULTRAM) 50 MG tablet Take 1 tablet (50 mg total) by mouth every 6 (six) hours as needed.  . [DISCONTINUED] diazepam (VALIUM) 5 MG tablet Daily at bedtime, or every 12 hours when necessary vertigo not relieved with Antivert (Patient not taking: Reported on 07/25/2016)  . [DISCONTINUED] Red Yeast Rice Extract (RED YEAST RICE PO) Take 2 tablets by mouth daily.   No facility-administered encounter medications on file as of 07/25/2016.

## 2016-09-24 ENCOUNTER — Other Ambulatory Visit: Payer: Self-pay | Admitting: Neurology

## 2016-09-24 MED FILL — OXYCODONE/APAP 5/325 MG TAB: 5-325 | 3 days supply | Qty: 30 | Fill #0

## 2016-10-30 ENCOUNTER — Other Ambulatory Visit: Payer: Self-pay | Admitting: Orthopedic Surgery

## 2016-11-08 NOTE — Pre-Procedure Instructions (Signed)
Jose [name].  11/08/2016      [place] [address] Phone: [phone] Fax: [phone]  [place], [place] [address] Phone: [phone] Fax: [phone]  [place], [place] [address] Phone: [phone] Fax: [phone]  [facility] [place], [place] [address] [place] [address] Phone: [phone] Fax: [phone]    Your procedure is scheduled on January 23  Report to [facility] at [facility].M.  Call this number if you have problems the morning of surgery:  [phone]   Remember:  Do not eat food or drink liquids after midnight.   Take these medicines the morning of surgery with A SIP OF WATER amLODipine (NORVASC),  fluticasone (FLONASE), gabapentin (NEURONTIN), labetalol (NORMODYNE), meclizine (ANTIVERT),  oxyCODONE-acetaminophen (PERCOCET/ROXICET), traMADol (ULTRAM)  7 days prior to surgery STOP taking any Aspirin, Aleve, Naproxen, Ibuprofen, Motrin, Advil, Goody's, BC's, all herbal medications, fish oil, and all vitamins    Do not wear jewelry.  Do not wear lotions, powders, or cologne, or deoderant.  Men may shave face and neck.  Do not bring valuables to the hospital.  [facility] is not responsible for any belongings or valuables.  Contacts, dentures or bridgework may not be worn into surgery.  Leave your suitcase in the car.  After surgery it may be brought to your room.  For patients admitted to the hospital, discharge time will be determined by your treatment team.  Patients discharged the day of surgery will not be allowed to drive home.    Special instructions:   [facility]- Preparing For Surgery  Before  surgery, you can play an important role. Because skin is not sterile, your skin needs to be as free of germs as possible. You can reduce the number of germs on your skin by washing with CHG (chlorahexidine gluconate) Soap before surgery.  CHG is an antiseptic cleaner which kills germs and bonds with the skin to continue killing germs even after washing.  Please do not use if you have an allergy to CHG or antibacterial soaps. If your skin becomes reddened/irritated stop using the CHG.  Do not shave (including legs and underarms) for at least 48 hours prior to first CHG shower. It is OK to shave your face.  Please follow these instructions carefully.   1. Shower the NIGHT BEFORE SURGERY and the MORNING OF SURGERY with CHG.   2. If you chose to wash your hair, wash your hair first as usual with your normal shampoo.  3. After you shampoo, rinse your hair and body thoroughly to remove the shampoo.  4. Use CHG as you would any other liquid soap. You can apply CHG directly to the skin and wash gently with a scrungie or a clean washcloth.   5. Apply the CHG Soap to your body ONLY FROM THE NECK DOWN.  Do not use on open wounds or open sores. Avoid contact with your eyes, ears, mouth and genitals (private parts). Wash genitals (private parts) with your normal soap.  6. Wash thoroughly, paying special attention to the area where your surgery will be performed.  7. Thoroughly rinse your body with warm  water from the neck down.  8. DO NOT shower/wash with your normal soap after using and rinsing off the CHG Soap.  9. Pat yourself dry with a CLEAN TOWEL.   10. Wear CLEAN PAJAMAS   11. Place CLEAN SHEETS on your bed the night of your first shower and DO NOT SLEEP WITH PETS.    Day of Surgery: Do not apply any deodorants/lotions. Please wear clean clothes to the hospital/surgery center.      Please read over the following fact sheets that you were given.

## 2016-11-09 ENCOUNTER — Encounter (HOSPITAL_COMMUNITY): Payer: Self-pay

## 2016-11-09 ENCOUNTER — Encounter (HOSPITAL_COMMUNITY)
Admission: RE | Admit: 2016-11-09 | Discharge: 2016-11-09 | Disposition: A | Discharge status: Home / Self Care | Payer: Medicare Other | Source: Ambulatory Visit | Attending: Orthopedic Surgery | Admitting: Orthopedic Surgery

## 2016-11-09 DIAGNOSIS — Z01812 Encounter for preprocedural laboratory examination: Secondary | ICD-10-CM | POA: Diagnosis present

## 2016-11-09 DIAGNOSIS — Z981 Arthrodesis status: Secondary | ICD-10-CM | POA: Insufficient documentation

## 2016-11-09 DIAGNOSIS — Z85828 Personal history of other malignant neoplasm of skin: Secondary | ICD-10-CM | POA: Diagnosis not present

## 2016-11-09 DIAGNOSIS — Z01818 Encounter for other preprocedural examination: Secondary | ICD-10-CM | POA: Insufficient documentation

## 2016-11-09 DIAGNOSIS — Z8673 Personal history of transient ischemic attack (TIA), and cerebral infarction without residual deficits: Secondary | ICD-10-CM | POA: Insufficient documentation

## 2016-11-09 DIAGNOSIS — I10 Essential (primary) hypertension: Secondary | ICD-10-CM | POA: Insufficient documentation

## 2016-11-09 DIAGNOSIS — M19012 Primary osteoarthritis, left shoulder: Secondary | ICD-10-CM | POA: Diagnosis not present

## 2016-11-09 DIAGNOSIS — I4891 Unspecified atrial fibrillation: Secondary | ICD-10-CM | POA: Diagnosis not present

## 2016-11-09 DIAGNOSIS — Z79899 Other long term (current) drug therapy: Secondary | ICD-10-CM | POA: Insufficient documentation

## 2016-11-09 DIAGNOSIS — G4733 Obstructive sleep apnea (adult) (pediatric): Secondary | ICD-10-CM | POA: Diagnosis not present

## 2016-11-09 DIAGNOSIS — Z87891 Personal history of nicotine dependence: Secondary | ICD-10-CM | POA: Insufficient documentation

## 2016-11-09 DIAGNOSIS — D472 Monoclonal gammopathy: Secondary | ICD-10-CM | POA: Diagnosis not present

## 2016-11-09 DIAGNOSIS — Z7901 Long term (current) use of anticoagulants: Secondary | ICD-10-CM | POA: Insufficient documentation

## 2016-11-09 HISTORY — DX: Other specified postprocedural states: Z98.890

## 2016-11-09 HISTORY — DX: Headache: R51

## 2016-11-09 HISTORY — DX: Headache, unspecified: R51.9

## 2016-11-09 HISTORY — DX: Other specified postprocedural states: R11.2

## 2016-11-09 LAB — CBC
HCT: 36 % — ABNORMAL LOW (ref 39.0–52.0)
Hemoglobin: 12.2 g/dL — ABNORMAL LOW (ref 13.0–17.0)
MCH: 31.9 pg (ref 26.0–34.0)
MCHC: 33.9 g/dL (ref 30.0–36.0)
MCV: 94 fL (ref 78.0–100.0)
PLATELETS: 204 10*3/uL (ref 150–400)
RBC: 3.83 MIL/uL — AB (ref 4.22–5.81)
RDW: 13.4 % (ref 11.5–15.5)
WBC: 7.4 10*3/uL (ref 4.0–10.5)

## 2016-11-09 LAB — PROTIME-INR
INR: 1.35
PROTHROMBIN TIME: 16.8 s — AB (ref 11.4–15.2)

## 2016-11-09 LAB — BASIC METABOLIC PANEL
Anion gap: 9 (ref 5–15)
BUN: 19 mg/dL (ref 6–20)
CALCIUM: 9.6 mg/dL (ref 8.9–10.3)
CO2: 27 mmol/L (ref 22–32)
CREATININE: 0.8 mg/dL (ref 0.61–1.24)
Chloride: 103 mmol/L (ref 101–111)
GFR calc Af Amer: >60 mL/min (ref >60)
Glucose, Bld: 126 mg/dL — ABNORMAL HIGH (ref 65–99)
POTASSIUM: 3.5 mmol/L (ref 3.5–5.1)
SODIUM: 139 mmol/L (ref 135–145)

## 2016-11-09 LAB — SURGICAL PCR SCREEN
MRSA, PCR: NEGATIVE
Staphylococcus aureus: NEGATIVE

## 2016-11-09 NOTE — Progress Notes (Addendum)
PCP is Dr. Charlcie Cradle in [facility] Cardiologist is Dr Atilano Median from [facility] sent for Stress test sent to Dr Gilman Schmidt office states he thinks he had it in 2015 (also requested last office visit) Echo noted from 10-13-14 Denies ever having a card cath. Instructed to bring in his CPAP mask on the day of surgery. Sleep study noted from 2016. Reports he was told to stop Eliquis 3 days prior to surgery. Reports he had 3 seizures while in was in the [employer] last in 1976.  He reports he had 3 episodes of stoke like symptoms once he couldn't find the word lasted about 5 mins, once he was driving and had vision changes lasted about 1 min., and once he had an episode that he was off balance. He went to the Dr and a Neuro Dr wanted him to wear a heart monitor he did and was found to have a fib. That was when he was placed on Eliquis. He is unsure if he had a tia or not. States that the brain scan was normal.

## 2016-11-12 ENCOUNTER — Encounter (HOSPITAL_COMMUNITY): Payer: Self-pay | Admitting: Emergency Medicine

## 2016-11-12 NOTE — Progress Notes (Addendum)
Anesthesia Chart Review:  Pt is a 81 year old male scheduled for L total shoulder arthroplasty on 11/20/2016 with Marchia Bond, MD.   - Cardiologist is Gwenith Spitz, MD, who cleared pt for surgery at last office visit 08/09/16 (notes in care everywhere) - PCP is Charlcie Cradle, MD, last office visit 07/16/16 (notes in care everywhere) - Neurologist is Margette Fast, MD, last office visit 01/24/16 - Pulmonologist Simonne Maffucci, MD  PMH includes:  Atrial fibrillation, HTN, MGUS, skin cancer, seizures (last '76), TIA (10/13/14: work-up revealed PAF), OSA, post-op N/V. Former smoker. BMI 29. S/p ACDF 02/04/15.   Medications include: amlodipine, lipitor, eliquis, labetalol, losartan-hctz. Pt to stop eliquis 3 days before surgery.   Preoperative labs reviewed.  PT 16.8. Will repeat DOS.  EKG 02/22/16: Sinus rhythm. Low voltage, precordial leads. LVH. Borderline T abnormalities, diffuse leads  11/10/14 - 12/06/14 Event monitor: Average HR 71 BPM. Tachycardia present 4%, bradycardia 20%. Afib 3% with fastest episode 143 bpm and longest episode 03:34:44. No pauses noted > 3 seconds.  10/13/14 Echo: Left ventricle: The cavity size was normal. Systolic function was normal. The estimated ejection fraction was in the range of 50%to 55%. Wall motion was normal; there were no regional wall motion abnormalities. Left ventricular diastolic function parameters were normal. Mild pulmonic regurgitation.  10/13/14 Carotid duplex:  - The vertebral arteries appear patent with antegrade flow. - Findings consistent with 1-39 percent cerebrovascular disease involving the B ICA.   Treadmill stress test 03/30/14 ([facility]): 1. Negative for ischemia patient achieved adequate or target heart rate. 2. Fair exercise tolerance, 8.6 METS. 3. Resting hypertension with normal response to exercise.  11/02/14 EEG: Impression: This is a normal EEG recording in the waking state. No evidence of ictal or interictal  discharges are seen.  If no changes, I anticipate pt can proceed with surgery as scheduled.   Willeen Cass, FNP-BC [facility] Phone: [phone] 11/15/2016 4:36 PM

## 2016-11-20 ENCOUNTER — Encounter (HOSPITAL_COMMUNITY): Admission: RE | Payer: Self-pay | Source: Ambulatory Visit

## 2016-11-20 ENCOUNTER — Inpatient Hospital Stay (HOSPITAL_COMMUNITY): Admission: RE | Admit: 2016-11-20 | Payer: Medicare Other | Source: Ambulatory Visit | Admitting: Orthopedic Surgery

## 2016-11-20 SURGERY — ARTHROPLASTY, SHOULDER, TOTAL
Anesthesia: General | Laterality: Left

## 2016-11-26 ENCOUNTER — Telehealth: Payer: Self-pay | Admitting: Neurology

## 2016-11-26 NOTE — Telephone Encounter (Signed)
Returned pt's call. Appt scheduled for Mon 12/17/16 w/ 12:00 arrival time. Pt may call back if he needs to r/x.

## 2016-11-26 NOTE — Telephone Encounter (Signed)
Patient called requesting a visit with Dr. Jannifer Franklin with increased back pain, trouble walking, and keeping balance.  Patient has followed up with PCP recently.  Pt request to see Dr. Jannifer Franklin only please.  Next available is June are we able to see patient sooner.?

## 2016-12-04 ENCOUNTER — Ambulatory Visit (INDEPENDENT_AMBULATORY_CARE_PROVIDER_SITE_OTHER): Payer: Medicare Other | Admitting: Neurology

## 2016-12-04 ENCOUNTER — Encounter: Payer: Self-pay | Admitting: Neurology

## 2016-12-04 VITALS — BP 120/80 | HR 56 | Resp 20 | Ht 72.0 in | Wt 227.0 lb

## 2016-12-04 DIAGNOSIS — M25512 Pain in left shoulder: Secondary | ICD-10-CM

## 2016-12-04 DIAGNOSIS — M25511 Pain in right shoulder: Secondary | ICD-10-CM

## 2016-12-04 DIAGNOSIS — Z9989 Dependence on other enabling machines and devices: Secondary | ICD-10-CM

## 2016-12-04 DIAGNOSIS — G8929 Other chronic pain: Secondary | ICD-10-CM

## 2016-12-04 DIAGNOSIS — G4733 Obstructive sleep apnea (adult) (pediatric): Secondary | ICD-10-CM

## 2016-12-04 NOTE — Progress Notes (Signed)
Subjective:    Patient ID: Jose [name]. is a 81 y.o. male.  HPI     Interim history:  [name] is a 81 year old right-handed gentleman with an underlying medical history of new onset A. fib, obesity, reflux disease, recurrent TIA, hypertension, MGUS, ulnar neuropathy and arthritis, who presents for follow-up consultation of his OSA, treated with CPAP therapy. The patient is unaccompanied today. I last saw him on 12/05/2015, at which time we talked about his split-night sleep study results from 03/01/2015 and his compliance with CPAP. He was fully compliant with CPAP therapy. I suggested with a checkup. He saw Dr. Jannifer Franklin in the interim in March 2017. He had been treated in January 2017 for a community-acquired pneumonia. He then presented to the emergency room in January 2017 a balance issues and was diagnosed with vertigo, placed on meclizine as needed.   Today, 12/04/2016 (all dictated new, as well as above notes, some dictation done in note pad or Word, outside of chart, may appear as copied):             I reviewed his CPAP compliance data from 11/03/2016 through 12/02/2016, which is a total of 30 days, during which time he used his CPAP every night with percent used days greater than 4 hours at 100%, indicating superb compliance with an average usage of 7 hours and 33 minutes, residual AHI 0.3 per hour, leaked low with the 95th percentile at 10.2 L/m on a pressure of 12 cm with EPR of 3. He reports doing well sleep wise, sleeps well with CPAP. Of note, he is scheduled for left total shoulder placement surgery under Dr. Mardelle Matte later this month. 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  Mother in law recently hospitalized, age 55, and had to delay his surgery. Both shoulders hurt and has back pain.   The patient's allergies, current medications, family history, past medical history, past social history, past surgical history and problem list were reviewed  and updated as appropriate.   Previously (copied from previous notes for reference):   I first met him on 06/01/2015 at the request of Dr. Jannifer Franklin, at which time we talked about his split-night sleep study results from 03/01/2015 as well as his compliance data. He reported doing well. He had adjusted well to treatment. He felt improved with respect to sleep disruption, daytime somnolence, and sleep quality. He had recent ulnar nerve surgery about 3 weeks prior. He was taking gabapentin for neuropathy. He was seeing a cardiologist out of [place] and was on Eliquis. He goes to bed around 11 PM and his rise time is around 7 AM. He wakes up better rested. His neuropathy seems stable to him. In the interim, he had neck surgery on 02/04/2015 under Dr. Hal Neer. He feels improved. He has a follow-up with his hand surgeon next week. He has not had any recent TIA type symptoms. He drinks alcohol occasionally. He quit smoking in the late 60s. He drinks quite a bit of coffee, at least 2 12 ounce cups a day.   I reviewed his CPAP compliance data from 11/01/2015 through 11/30/2015 which is a total of 30 days during which time he used his machine every night with percent used days greater than 4 hours at 100%, indicating superb compliance with an average usage of 7 hours and 29 minutes, residual AHI low at 0.4 per hour, leak borderline with the 95th percentile at 24.3 L/m on a  pressure of 12 cm with EPR of 3.   06/01/2015: He was referred for a sleep study secondary to a report of witnessed apneas during his hospitalization in 1/16, and snoring reported. He had a split-night sleep study on 03/01/2015 and went over his test results with him in detail today. His baseline sleep efficiency was reduced at 77% with a latency to sleep of 5.5 minutes and wake after sleep onset of 29.5 minutes with moderate sleep fragmentation noted. He had a markedly elevated arousal index at 73.3 per hour, secondary to respiratory events. He  had absence of slow-wave sleep and REM sleep prior to CPAP initiation. He had no significant PLMS, EKG or EEG changes. He had moderate snoring. He had a total AHI highly elevated at 71.3 per hour, average oxygen saturation was 93%, nadir was 83% during non-REM sleep. He was then placed on CPAP therapy. Sleep efficiency was 87.5% with a latency to sleep of 4 minutes and wake after sleep onset of 27 minutes with mild to moderate sleep fragmentation noted. He had a tremendous improvement in his arousal index to 3.6 per hour. He had 12.2% of slow-wave sleep and achieved 28.9% of REM sleep. Average oxygen saturation was 93%, nadir was 81%. He had no significant PLMS during the treatment portion of the study. CPAP was titrated from 5 cm to 13 cm with a reduction of his AHI to 2.8 per hour at a pressure of 12 cm. Based on his test results I prescribed CPAP therapy for home use.    I reviewed his CPAP compliance data from 05/01/2015 through 05/30/2015 which is a total of 30 days during which time he used his machine every night with percent used days greater than 4 hours at 93%, indicating excellent compliance with an average usage of 6 hours and 32 minutes, residual AHI at 3.4 per hour, leak acceptable with the 95th percentile at 20.2 L/m on a pressure of 12 cm with EPR of 3.  His Past Medical History Is Significant For: Past Medical History:  Diagnosis Date  . Arthritis   . Dysrhythmia    afib  . Gallstones 07/1996  . Headache   . History of CT scan of head 1997   small vessel disease  . History of ETT 05/2002    no EKG changes  . History of MRI of cervical spine 07/15/2002  . Hypertension   . MGUS (monoclonal gammopathy of unknown significance) 04/11/2012  . Peripheral neuropathy ([place])   . Pneumonia 10/2014  . PONV (postoperative nausea and vomiting)    once many years ago  . Rectal bleeding 12/2002   colonoscopy  . Seizures ([place])    last seizure 1976  . Skin cancer   . Sleep apnea    ? to have  study after surgery  . Stroke [facility])    ?tia ?afib  . TIA (transient ischemic attack)   . 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 extremity 03/29/2015    His Past Surgical History Is Significant For: Past Surgical History:  Procedure Laterality Date  . ADENOIDECTOMY    . ANTERIOR CERVICAL DECOMP/DISCECTOMY FUSION N/A 02/04/2015   Procedure: ANTERIOR CERVICAL DECOMPRESSION/DISCECTOMY FUSION CERVICAL FIVE-SIX,CERVICAL SIX-SEVEN;  Surgeon: Karie Chimera, MD;  Location: [place] [facility];  Service: Neurosurgery;  Laterality: N/A;  . CATARACT EXTRACTION Bilateral   . COLONOSCOPY W/ POLYPECTOMY  04/19/2003   tubular adenoma  . GREAT TOE ARTHRODESIS, INTERPHALANGEAL JOINT Left 1978  .  keratosis excision Right 05/1998   wrist  . nerve transfer surgery to lwft hand Left    Nov. 2017  . SHOULDER ARTHROSCOPY Left 15   rotator cuff   . SHOULDER INJECTION  June 2012   left, [place]  . SKIN CANCER EXCISION  07/1993   left lateral arm  . thumb injection  June 2012   left, [place]  . TONSILLECTOMY    . ulnar nerve transfer  12/1999   left    His Family History Is Significant For: Family History  Problem Relation Age of Onset  . Dementia Mother 47    vascular  . Transient ischemic attack Mother   . Dementia Father     after hip fracture  . Hypertension Sister     skin cancer  . Seizures Neg Hx     His Social History Is Significant For: Social History   Social History  . Marital status: Married    Spouse name: [name]  . Number of children: 2  . Years of education: 38   Occupational History  . retired Unemployed   Social History Main Topics  . Smoking status: Former Smoker    Years: 15.00    Quit date: 03/05/1968  . Smokeless tobacco: Never Used  . Alcohol use 1.2 oz/week    2 Standard drinks or equivalent per week     Comment: 2-3 times a month- will have a glass of wine  . Drug use: No  . Sexual activity: Not Asked    Other Topics Concern  . None   Social History Narrative   Lives with 4th wife, [name], married 2010   Retired, organizational psychology   [employer], [place], [name], 2 children   Daughter, Lane, in [place], 2 children   Patient is right handed   Patient drinks approximately 3-4 cups of caffeine daily    His Allergies Are:  Allergies  Allergen Reactions  . Maprotiline     TETRACYCLIC ANTIDEPRESSANTS UNSPECIFIED REACTION   . Tetracycline Hcl Itching and Rash    Rash on hands  :   His Current Medications Are:  Outpatient Encounter Prescriptions as of 12/04/2016  Medication Sig  . amLODipine (NORVASC) 5 MG tablet Take 5 mg by mouth daily.  Marland Kitchen atorvastatin (LIPITOR) 10 MG tablet Take 10 mg by mouth daily.  Marland Kitchen ELIQUIS 5 MG TABS tablet TAKE 1 TABLET TWICE A DAY  . fluticasone (FLONASE) 50 MCG/ACT nasal spray Place 1 spray into both nostrils daily as needed for allergies.   Marland Kitchen gabapentin (NEURONTIN) 300 MG capsule Take 1 capsule (300 mg total) by mouth 2 (two) times daily.  Marland Kitchen labetalol (NORMODYNE) 200 MG tablet Take 100 mg by mouth 2 (two) times daily.  Marland Kitchen losartan-hydrochlorothiazide (HYZAAR) 50-12.5 MG per tablet Take 1 tablet by mouth 2 (two) times daily.  . meclizine (ANTIVERT) 25 MG tablet Take 1 tablet (25 mg total) by mouth 3 (three) times daily as needed. (Patient taking differently: Take 25 mg by mouth 3 (three) times daily as needed for dizziness. )  . Multiple Vitamins-Minerals (PRESERVISION AREDS 2) CAPS Take 1 capsule by mouth 2 (two) times daily.   . Omega-3 Fatty Acids (FISH OIL) 1000 MG CAPS Take 2,000 mg by mouth daily.  Marland Kitchen oxyCODONE-acetaminophen (PERCOCET/ROXICET) 5-325 MG tablet Take 0.5-1 tablets by mouth every 6 (six) hours as needed. For severe shoulder pain  . traMADol (ULTRAM) 50 MG tablet Take 1 tablet (50 mg total) by mouth every  6 (six) hours as needed.   No facility-administered encounter medications on file as of 12/04/2016.    :  Review of Systems:  Out of a complete 14 point review of systems, all are reviewed and negative with the exception of these symptoms as listed below: Review of Systems  Neurological:       Pt presents to discuss his cpap usage. Pt needs a shoulder replacement surgery.    Objective:  Neurologic Exam  Physical Exam Physical Examination:   Vitals:   12/04/16 1051  BP: 120/80  Pulse: (!) 56  Resp: 20   General Examination: The patient is a very pleasant 81 y.o. male in no acute distress. He appears well-developed and well-nourished and very well groomed. Good spirits today.   HEENT: Normocephalic, atraumatic, pupils are equal, round and reactive to light and accommodation. Extraocular tracking is good without limitation to gaze excursion or nystagmus noted. Normal smooth pursuit is noted. Hearing is grossly intact. s/p cataract surgeries. Face is symmetric with normal facial animation and normal facial sensation. Speech is clear with no dysarthria noted. There is no hypophonia. There is no lip, neck/head, jaw or voice tremor. Neck is supple with full range of passive and active motion. There are no carotid bruits on auscultation. Oropharynx exam reveals: mild mouth dryness, adequate dental hygiene and moderate airway crowding. Mallampati is class II. Tongue protrudes centrally and palate elevates symmetrically. Tonsils are absent.   Chest: Clear to auscultation without wheezing, rhonchi or crackles noted.  Heart: S1+S2+0, regular and normal without murmurs, rubs or gallops noted.   Abdomen: Soft, non-tender and non-distended with normal bowel sounds appreciated on auscultation.  Extremities: There is trace pitting edema in the distal lower extremities bilaterally around the ankles.   Skin: Warm and dry without trophic changes noted.  Musculoskeletal: exam reveals severe decrease in ROM in both shoulders and reports mild L hip pain.   Neurologically:  Mental status: The patient  is awake, alert and oriented in all 4 spheres. His immediate and remote memory, attention, language skills and fund of knowledge are appropriate. There is no evidence of aphasia, agnosia, apraxia or anomia. Speech is clear with normal prosody and enunciation. Thought process is linear. Mood is normal and affect is normal.  Cranial nerves II - XII are as described above under HEENT exam. In addition: shoulder shrug is normal with equal shoulder height noted. Motor exam: Normal bulk, strength and tone is noted. There is no drift, tremor or rebound. Romberg is not testable safely. Reflexes 1+ in the UEs and trace in the LEs. Fine motor skills and coordination: intact with normal finger taps, normal hand movements, but not able to do finger to nose, heel to shin okay on R. Cerebellar testing: No dysmetria or intention tremor, but heel to shin is difficult on L.  Sensory exam: intact to light touch in the upper and lower extremities.  Gait, station and balance: He stands with difficulty. Stance is wide based and posture is age-appropriate. Tandem walk is not testable safely.   Assessment and Plan:   In summary, Kelvis Berger. is a very pleasant 81 y.o.-year old male with an underlying medical history of A fib, GERD, TIAs, HTN, MGUS, PN, and s/p ulnar nerve surgery, severe arthritis with upcoming shoulder surgery, Who presents for follow-up consultation of his severe obstructive sleep apnea, well established on CPAP therapy with full compliance. He continues to do well. He takes his machine with him when he travels and feels  he cannot sleep without it. He is commended for his treatment adherence. He uses a nasal mask, did not tolerate the nasal pillows. We talked about his compliance data today. He has residual low back pain and severe bilateral shoulder pain, upcoming shoulder replacement surgery under Dr. Mardelle Matte. From my end of things he is doing well, I suggested a one-year checkup. I answered all his  questions today and the patient was in agreement.  I spent 25 minutes in total face-to-face time with the patient, more than 50% of which was spent in counseling and coordination of care, reviewing test results, reviewing medication and discussing or reviewing the diagnosis of OSA, its prognosis and treatment options. Pertinent laboratory and imaging test results that were available during this visit with the patient were reviewed by me and considered in my medical decision making (see chart for details).

## 2016-12-04 NOTE — Patient Instructions (Signed)
Please continue using your CPAP regularly. While your insurance requires that you use CPAP at least 4 hours each night on 70% of the nights, I recommend, that you not skip any nights and use it throughout the night if you can. Getting used to CPAP and staying with the treatment long term does take time and patience and discipline. Untreated obstructive sleep apnea when it is moderate to severe can have an adverse impact on cardiovascular health and raise her risk for heart disease, arrhythmias, hypertension, congestive heart failure, stroke and diabetes. Untreated obstructive sleep apnea causes sleep disruption, nonrestorative sleep, and sleep deprivation. This can have an impact on your day to day functioning and cause daytime sleepiness and impairment of cognitive function, memory loss, mood disturbance, and problems focussing. Using CPAP regularly can improve these symptoms.  Keep up the good work! I will see you back in 1 year for sleep apnea check up. Good luck with your upcoming shoulder surgery.

## 2016-12-14 ENCOUNTER — Encounter (HOSPITAL_COMMUNITY): Payer: Self-pay

## 2016-12-14 ENCOUNTER — Encounter (HOSPITAL_COMMUNITY)
Admission: RE | Admit: 2016-12-14 | Discharge: 2016-12-14 | Disposition: A | Discharge status: Home / Self Care | Payer: Medicare Other | Source: Ambulatory Visit | Attending: Surgery | Admitting: Surgery

## 2016-12-14 DIAGNOSIS — M19012 Primary osteoarthritis, left shoulder: Secondary | ICD-10-CM | POA: Insufficient documentation

## 2016-12-14 DIAGNOSIS — Z01812 Encounter for preprocedural laboratory examination: Secondary | ICD-10-CM | POA: Diagnosis present

## 2016-12-14 LAB — BASIC METABOLIC PANEL
Anion gap: 9 (ref 5–15)
BUN: 21 mg/dL — ABNORMAL HIGH (ref 6–20)
CHLORIDE: 102 mmol/L (ref 101–111)
CO2: 30 mmol/L (ref 22–32)
Calcium: 9.8 mg/dL (ref 8.9–10.3)
Creatinine, Ser: 0.79 mg/dL (ref 0.61–1.24)
GFR calc Af Amer: >60 mL/min (ref >60)
GFR calc non Af Amer: >60 mL/min (ref >60)
GLUCOSE: 82 mg/dL (ref 65–99)
POTASSIUM: 3.9 mmol/L (ref 3.5–5.1)
SODIUM: 141 mmol/L (ref 135–145)

## 2016-12-14 LAB — CBC
HEMATOCRIT: 36.5 % — AB (ref 39.0–52.0)
HEMOGLOBIN: 12 g/dL — AB (ref 13.0–17.0)
MCH: 31.6 pg (ref 26.0–34.0)
MCHC: 32.9 g/dL (ref 30.0–36.0)
MCV: 96.1 fL (ref 78.0–100.0)
Platelets: 211 10*3/uL (ref 150–400)
RBC: 3.8 MIL/uL — AB (ref 4.22–5.81)
RDW: 13.4 % (ref 11.5–15.5)
WBC: 5.8 10*3/uL (ref 4.0–10.5)

## 2016-12-14 LAB — PROTIME-INR
INR: 1.48
PROTHROMBIN TIME: 18 s — AB (ref 11.4–15.2)

## 2016-12-14 LAB — SURGICAL PCR SCREEN
MRSA, PCR: NEGATIVE
Staphylococcus aureus: NEGATIVE

## 2016-12-14 NOTE — Pre-Procedure Instructions (Addendum)
Jose Skeans Jr.  12/14/2016      [place], [place] [address] Phone: [phone] Fax: [phone]  [place], [place] [address] [place] [address] Phone: [phone] Fax: [phone]  [place], [place] [address] Phone: [phone] Fax: [phone]  [facility] [place], [place] [address] [place] [address] Phone: [phone] Fax: [phone]    Your procedure is scheduled on Feb. 27  Report to [facility] at 800 A.M.  Call this number if you have problems the morning of surgery:  [phone]   Remember:  Do not eat food or drink liquids after midnight.  Take these medicines the morning of surgery with A SIP OF WATER Amlodipine (norvasc), Gabapentin (Neurontin),  Labetalol (Normodyne), Oxycodone-acetaminophen (Percocet)if needed orTramadol (Ultram) if needed, Meclizine (Antivert) if needed  Stop taking BC's, Goody's, Aspirin,  Herbal medications, Fish Oil, Ibuprofen, Advil, Motrin, Aleve, vitamins Stop taking Eliquis as directed by your Dr.   Lazaro Arms not wear jewelry, make-up or nail polish.  Do not wear lotions, powders, or perfumes, or deoderant.  Do not shave 48 hours prior to surgery.  Men may shave face and neck.  Do not bring valuables to the hospital.  [facility] is not responsible for any belongings or valuables.  Contacts, dentures or bridgework may not be worn into surgery.  Leave your suitcase in the car.  After surgery it may be brought to your room.  For patients admitted to the hospital, discharge time will be determined by your treatment team.  Patients discharged the day of surgery will not be allowed to drive home.    Special  instructions:  [place] - Preparing for Surgery  Before surgery, you can play an important role.  Because skin is not sterile, your skin needs to be as free of germs as possible.  You can reduce the number of germs on you skin by washing with CHG (chlorahexidine gluconate) soap before surgery.  CHG is an antiseptic cleaner which kills germs and bonds with the skin to continue killing germs even after washing.  Please DO NOT use if you have an allergy to CHG or antibacterial soaps.  If your skin becomes reddened/irritated stop using the CHG and inform your nurse when you arrive at Short Stay.  Do not shave (including legs and underarms) for at least 48 hours prior to the first CHG shower.  You may shave your face.  Please follow these instructions carefully:   1.  Shower with CHG Soap the night before surgery and the     morning of Surgery.  2.  If you choose to wash your hair, wash your hair first as usual with your   normal shampoo.  3.  After you shampoo, rinse your hair and body thoroughly to remove the Shampoo.  4.  Use CHG as you would any other liquid soap.  You can apply chg directly to the skin and wash gently with scrungie or a clean washcloth.  5.  Apply the CHG Soap to your body ONLY FROM THE NECK DOWN.  Do not use on open wounds or open sores.  Avoid contact with your eyes,  ears, mouth and genitals (private parts).  Wash genitals (private parts)       with your normal soap.  6.  Wash thoroughly, paying special attention to the area where your surgery will be performed.  7.  Thoroughly rinse your body with warm water from the neck down.  8.  DO NOT shower/wash with your normal soap after using and rinsing off the CHG Soap.  9.  Pat yourself dry with a clean towel.            10.  Wear clean pajamas.            11.  Place clean sheets on your bed the night of your first shower and do not  sleep with pets.  Day of Surgery  Do not apply any lotions/deoderants the morning of  surgery.  Please wear clean clothes to the hospital/surgery center.     Please read over the following fact sheets that you were given. Pain Booklet, Coughing and Deep Breathing, MRSA Information and Surgical Site Infection Prevention

## 2016-12-14 NOTE — Progress Notes (Addendum)
PCP: Lilian Kapur, MD  Cardiologist: Dr. Atilano Median  ECG: 02/22/16 in EPIC  ECHO: 10/13/14 in EPIC  Stress test: 04/18/14 in EPIC  Cardiac Cath: pt denies  Chest x-ray:

## 2016-12-17 ENCOUNTER — Encounter: Payer: Self-pay | Admitting: Neurology

## 2016-12-17 ENCOUNTER — Ambulatory Visit (INDEPENDENT_AMBULATORY_CARE_PROVIDER_SITE_OTHER): Payer: Medicare Other | Admitting: Neurology

## 2016-12-17 DIAGNOSIS — M4306 Spondylolysis, lumbar region: Secondary | ICD-10-CM | POA: Diagnosis not present

## 2016-12-17 HISTORY — DX: Spondylolysis, lumbar region: M43.06

## 2016-12-17 MED ORDER — GABAPENTIN 300 MG PO CAPS: ORAL_CAPSULE | ORAL | 2 refills | Status: DC

## 2016-12-17 NOTE — Patient Instructions (Signed)
With the gabapentin 300 mg capsules, take one three times a day for 2 weeks, then take one twice a day and two at night.

## 2016-12-17 NOTE — Progress Notes (Signed)
Reason for visit: Peripheral neuropathy, lumbosacral spondylosis  Jose [name]. is an 81 y.o. male  History of present illness:  Jose [name] is a 81 year old right-handed white male with a history of numbness in the feet associated with a peripheral neuropathy, but the patient also has significant lumbar spine disease that is multilevel in nature. The patient has had gradual worsening of his back issues, he will have sharp shooting pains going down the left greater than right leg. The patient is limited on how far he can ambulate secondary to the discomfort. The patient has not had any falls, but he does feel that the balance has been altered. The patient will be going for a left total shoulder replacement within the next 2 weeks. He is on Eliquis, he will come off of the medication for the surgery. The patient currently is sleeping fairly well at night on CPAP, the pain does not keep him awake. He is on gabapentin taking 300 mg twice daily.  Past Medical History:  Diagnosis Date  . Arthritis   . Dysrhythmia    afib  . Gallstones 07/1996  . Headache   . History of CT scan of head 1997   small vessel disease  . History of ETT 05/2002    no EKG changes  . History of MRI of cervical spine 07/15/2002  . Hypertension   . Lumbar spondylolysis 12/17/2016  . MGUS (monoclonal gammopathy of unknown significance) 04/11/2012  . Peripheral neuropathy ([place])   . Pneumonia 10/2014  . PONV (postoperative nausea and vomiting)    once many years ago  . Rectal bleeding 12/2002   colonoscopy  . Seizures ([place])    last seizure 1976  . Skin cancer   . Sleep apnea    ? to have study after surgery  . Stroke [facility])    ?tia ?afib  . TIA (transient ischemic attack)   . 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 ADENOIDECTOMY    . ANTERIOR CERVICAL DECOMP/DISCECTOMY FUSION N/A  02/04/2015   Procedure: ANTERIOR CERVICAL DECOMPRESSION/DISCECTOMY FUSION CERVICAL FIVE-SIX,CERVICAL SIX-SEVEN;  Surgeon: Karie Chimera, MD;  Location: [facility];  Service: Neurosurgery;  Laterality: N/A;  . CATARACT EXTRACTION Bilateral   . COLONOSCOPY W/ POLYPECTOMY  04/19/2003   tubular adenoma  . GREAT TOE ARTHRODESIS, INTERPHALANGEAL JOINT Left 1978  . keratosis excision Right 05/1998   wrist  . nerve transfer surgery to lwft hand Left    Nov. 2017  . SHOULDER ARTHROSCOPY Left 15   rotator cuff   . SHOULDER INJECTION  June 2012   left, [place]  . SKIN CANCER EXCISION  07/1993   left lateral arm  . thumb injection  June 2012   left, [place]  . TONSILLECTOMY    . ulnar nerve transfer  12/1999   left    Family History  Problem Relation Age of Onset  . Dementia Mother 39    vascular  . Transient ischemic attack Mother   . Dementia Father     after hip fracture  . Hypertension Sister     skin cancer  . Seizures Neg Hx     Social history:  reports that he quit smoking about 48 years ago. He quit after 15.00 years of use. He has never used smokeless tobacco. He reports that he drinks about 1.2 oz of alcohol per  week . He reports that he does not use drugs.    Allergies  Allergen Reactions  . Maprotiline     TETRACYCLIC ANTIDEPRESSANTS UNSPECIFIED REACTION (patient is unaware of allergy)  . Tetracycline Hcl Itching and Rash    Rash on hands    Medications:  Prior to Admission medications   Medication Sig Start Date End Date Taking? Authorizing Provider  amLODipine (NORVASC) 5 MG tablet Take 5 mg by mouth daily.   Yes Historical Provider, MD  atorvastatin (LIPITOR) 10 MG tablet Take 10 mg by mouth daily.   Yes Historical Provider, MD  ELIQUIS 5 MG TABS tablet TAKE 1 TABLET TWICE A DAY 09/25/16  Yes Kathrynn Ducking, MD  fluticasone [name]) 50 MCG/ACT nasal spray Place 1 spray into both nostrils at bedtime as needed (for nasal congestion.).    Yes Historical Provider, MD   gabapentin (NEURONTIN) 300 MG capsule One capsule twice during the day and 2 at night 12/17/16  Yes Kathrynn Ducking, MD  labetalol (NORMODYNE) 200 MG tablet Take 100 mg by mouth 2 (two) times daily.   Yes Historical Provider, MD  losartan-hydrochlorothiazide (HYZAAR) 50-12.5 MG per tablet Take 1 tablet by mouth 2 (two) times daily.   Yes Historical Provider, MD  meclizine (ANTIVERT) 25 MG tablet Take 1 tablet (25 mg total) by mouth 3 (three) times daily as needed. Patient taking differently: Take 25 mg by mouth 3 (three) times daily as needed for dizziness.  02/22/16  Yes Tanna Furry, MD  Multiple Vitamin (MULTIVITAMIN WITH MINERALS) TABS tablet Take 1 tablet by mouth daily. Centrum Silver   Yes Historical Provider, MD  Multiple Vitamins-Minerals (PRESERVISION AREDS 2) CAPS Take 1 capsule by mouth 2 (two) times daily.    Yes Historical Provider, MD  Omega-3 Fatty Acids (FISH OIL) 1000 MG CAPS Take 1,000 mg by mouth 2 (two) times daily.    Yes Historical Provider, MD  oxyCODONE-acetaminophen (PERCOCET/ROXICET) 5-325 MG tablet Take 0.5-1 tablets by mouth every 6 (six) hours as needed. For severe shoulder pain 09/24/16  Yes Historical Provider, MD  Propylene Glycol (SYSTANE BALANCE) 0.6 % SOLN Place 1-2 drops into both eyes at bedtime.   Yes Historical Provider, MD  traMADol (ULTRAM) 50 MG tablet Take 1 tablet (50 mg total) by mouth every 6 (six) hours as needed. 01/24/16  Yes Kathrynn Ducking, MD    ROS:  Out of a complete 14 system review of symptoms, the patient complains only of the following symptoms, and all other reviewed systems are negative.  Back pain, walking difficulty Numbness, weakness  Blood pressure 122/76, pulse (!) 58, height 6' (1.829 m), weight 229 lb (103.9 kg).  Physical Exam  General: The patient is alert and cooperative at the time of the examination.  Skin: No significant peripheral edema is noted.   Neurologic Exam  Mental status: The patient is alert and oriented  x 3 at the time of the examination. The patient has apparent normal recent and remote memory, with an apparently normal attention span and concentration ability.   Cranial nerves: Facial symmetry is present. Speech is normal, no aphasia or dysarthria is noted. Extraocular movements are full. Visual fields are full.  Motor: The patient has good strength in all 4 extremities, with exception of weakness with intrinsic muscles of the left hand. There is atrophy of the left first dorsal interosseous muscle.  Sensory examination: Soft touch sensation is symmetric on the face, arms, and legs.  Coordination: The patient has good finger-nose-finger and  heel-to-shin bilaterally.  Gait and station: The patient walks with short steps, he is unable to walk on the toe on the left foot, able to walk on heels bilaterally.  Gait is slightly unsteady. Romberg is negative. No drift is seen.  Reflexes: Deep tendon reflexes are symmetric, but are depressed.   MRI lumbar 02/03/16:  IMPRESSION: This MRI of the lumbar spine shows severe multilevel degenerative changes as detailed above. The most significant findings are: 1. At L2-L3, there is moderately severe spinal stenosis due to disc protrusion, facet hypertrophy and ligamentum flavum hypertrophy. There is severe left lateral recess stenosis that could lead to left L3 nerve root compression. 2. At L3-L4, there is mild spinal stenosis due to loss of disc height, disc bulging, facet and ligamentum flavum hypertrophy and endplate spurring. There is moderately severe left lateral recess stenosis that could lead to left L4 nerve root compression. There is also encroachment upon both L3 nerve roots with less potential for compression. 3. At L4-L5, loss of disc height, severe facet hypertrophy, ligamentum flavum hypertrophy and endplate spurring causes severe right and moderately severe left foraminal narrowing and moderate lateral recess stenosis. There is  probable right L4 and possible left L4 nerve root compression. There is also some encroachment upon the traversing L5 nerve roots with less potential for compression. 4. At L5-S1, there are lateral disc protrusions and severe facet hypertrophy causing moderately severe foraminal narrowing that could lead to left and right L5 nerve root compression.   Assessment/Plan:  1. Peripheral neuropathy  2. Lumbosacral spondylosis  The patient is having increased symptoms with his low back. He will go up on the gabapentin taking 300 mg 3 times daily for 2 weeks, then take 300 mg twice daily and 600 mg in the evening. A new prescription was sent in. The patient will follow-up in 6 months.  Jill Alexanders MD 12/17/2016 12:44 PM  [facility] [address] [place], [place] [address]  Phone [phone] Fax [phone]

## 2016-12-21 ENCOUNTER — Other Ambulatory Visit: Payer: Self-pay | Admitting: Orthopedic Surgery

## 2016-12-24 ENCOUNTER — Ambulatory Visit: Payer: Self-pay | Admitting: Neurology

## 2016-12-25 ENCOUNTER — Inpatient Hospital Stay (HOSPITAL_COMMUNITY): Payer: Medicare Other | Admitting: Certified Registered Nurse Anesthetist

## 2016-12-25 ENCOUNTER — Inpatient Hospital Stay (HOSPITAL_COMMUNITY): Payer: Medicare Other

## 2016-12-25 ENCOUNTER — Encounter (HOSPITAL_COMMUNITY): Payer: Self-pay | Admitting: Certified Registered Nurse Anesthetist

## 2016-12-25 ENCOUNTER — Inpatient Hospital Stay (HOSPITAL_COMMUNITY)
Admission: RE | Admit: 2016-12-25 | Discharge: 2016-12-27 | DRG: 483 | Disposition: A | Discharge status: Home / Self Care | Payer: Medicare Other | Source: Ambulatory Visit | Attending: Orthopedic Surgery | Admitting: Orthopedic Surgery

## 2016-12-25 ENCOUNTER — Encounter (HOSPITAL_COMMUNITY)
Admission: RE | Disposition: A | Discharge status: Home / Self Care | Payer: Self-pay | Source: Ambulatory Visit | Attending: Orthopedic Surgery

## 2016-12-25 DIAGNOSIS — Z79899 Other long term (current) drug therapy: Secondary | ICD-10-CM | POA: Diagnosis not present

## 2016-12-25 DIAGNOSIS — Z888 Allergy status to other drugs, medicaments and biological substances status: Secondary | ICD-10-CM | POA: Diagnosis not present

## 2016-12-25 DIAGNOSIS — Z85828 Personal history of other malignant neoplasm of skin: Secondary | ICD-10-CM | POA: Diagnosis not present

## 2016-12-25 DIAGNOSIS — I10 Essential (primary) hypertension: Secondary | ICD-10-CM | POA: Diagnosis present

## 2016-12-25 DIAGNOSIS — M19012 Primary osteoarthritis, left shoulder: Secondary | ICD-10-CM

## 2016-12-25 DIAGNOSIS — Z7901 Long term (current) use of anticoagulants: Secondary | ICD-10-CM | POA: Diagnosis not present

## 2016-12-25 DIAGNOSIS — Z9889 Other specified postprocedural states: Secondary | ICD-10-CM | POA: Diagnosis not present

## 2016-12-25 DIAGNOSIS — Z87891 Personal history of nicotine dependence: Secondary | ICD-10-CM | POA: Diagnosis not present

## 2016-12-25 DIAGNOSIS — Z96612 Presence of left artificial shoulder joint: Secondary | ICD-10-CM

## 2016-12-25 DIAGNOSIS — Z9842 Cataract extraction status, left eye: Secondary | ICD-10-CM | POA: Diagnosis not present

## 2016-12-25 DIAGNOSIS — Z808 Family history of malignant neoplasm of other organs or systems: Secondary | ICD-10-CM

## 2016-12-25 DIAGNOSIS — Z8673 Personal history of transient ischemic attack (TIA), and cerebral infarction without residual deficits: Secondary | ICD-10-CM | POA: Diagnosis not present

## 2016-12-25 DIAGNOSIS — Z820 Family history of epilepsy and other diseases of the nervous system: Secondary | ICD-10-CM

## 2016-12-25 DIAGNOSIS — Z9841 Cataract extraction status, right eye: Secondary | ICD-10-CM

## 2016-12-25 DIAGNOSIS — Z8249 Family history of ischemic heart disease and other diseases of the circulatory system: Secondary | ICD-10-CM | POA: Diagnosis not present

## 2016-12-25 DIAGNOSIS — Z96619 Presence of unspecified artificial shoulder joint: Secondary | ICD-10-CM

## 2016-12-25 HISTORY — PX: TOTAL SHOULDER ARTHROPLASTY: SHX126

## 2016-12-25 HISTORY — DX: Primary osteoarthritis, left shoulder: M19.012

## 2016-12-25 LAB — GLUCOSE, CAPILLARY: Glucose-Capillary: 128 mg/dL — ABNORMAL HIGH (ref 65–99)

## 2016-12-25 SURGERY — ARTHROPLASTY, SHOULDER, TOTAL
Anesthesia: Regional | Laterality: Left

## 2016-12-25 MED ORDER — PHENYLEPHRINE 40 MCG/ML (10ML) SYRINGE FOR IV PUSH (FOR BLOOD PRESSURE SUPPORT)
PREFILLED_SYRINGE | INTRAVENOUS | Status: DC | PRN
  Administered 2016-12-25: 80 ug via INTRAVENOUS

## 2016-12-25 MED ORDER — MENTHOL 3 MG MT LOZG: 1.0000 | LOZENGE | OROMUCOSAL | Status: DC | PRN

## 2016-12-25 MED ORDER — MAGNESIUM CITRATE PO SOLN: 1.0000 | Freq: Once | ORAL | Status: DC | PRN

## 2016-12-25 MED ORDER — LABETALOL HCL 100 MG PO TABS
100.0000 mg | ORAL_TABLET | Freq: Two times a day (BID) | ORAL | Status: DC
  Administered 2016-12-25 – 2016-12-27 (×4): 100 mg via ORAL
  Filled 2016-12-25 (×5): qty 1

## 2016-12-25 MED ORDER — METHOCARBAMOL 500 MG PO TABS
500.0000 mg | ORAL_TABLET | Freq: Four times a day (QID) | ORAL | Status: DC | PRN
  Administered 2016-12-25 – 2016-12-27 (×4): 500 mg via ORAL
  Filled 2016-12-25 (×5): qty 1

## 2016-12-25 MED ORDER — ACETAMINOPHEN 325 MG PO TABS: 650.0000 mg | ORAL_TABLET | Freq: Four times a day (QID) | ORAL | Status: DC | PRN

## 2016-12-25 MED ORDER — FENTANYL CITRATE (PF) 100 MCG/2ML IJ SOLN
INTRAMUSCULAR | Status: AC
  Administered 2016-12-25: 50 ug
  Filled 2016-12-25: qty 2

## 2016-12-25 MED ORDER — METHOCARBAMOL 1000 MG/10ML IJ SOLN
500.0000 mg | Freq: Four times a day (QID) | INTRAVENOUS | Status: DC | PRN
  Filled 2016-12-25: qty 5

## 2016-12-25 MED ORDER — ADULT MULTIVITAMIN W/MINERALS CH
1.0000 | ORAL_TABLET | Freq: Every day | ORAL | Status: DC
  Administered 2016-12-26 – 2016-12-27 (×2): 1 via ORAL
  Filled 2016-12-25 (×2): qty 1

## 2016-12-25 MED ORDER — GLYCOPYRROLATE 0.2 MG/ML IV SOSY
PREFILLED_SYRINGE | INTRAVENOUS | Status: DC | PRN
  Administered 2016-12-25 (×2): .2 mg via INTRAVENOUS

## 2016-12-25 MED ORDER — OXYCODONE HCL 5 MG PO TABS: 5.0000 mg | ORAL_TABLET | Freq: Once | ORAL | Status: DC | PRN

## 2016-12-25 MED ORDER — ALUM & MAG HYDROXIDE-SIMETH 200-200-20 MG/5ML PO SUSP
30.0000 mL | ORAL | Status: DC | PRN
Start: 2016-12-25 — End: 2016-12-27

## 2016-12-25 MED ORDER — PHENYLEPHRINE HCL 10 MG/ML IJ SOLN
INTRAVENOUS | Status: DC | PRN
  Administered 2016-12-25: 25 ug/min via INTRAVENOUS

## 2016-12-25 MED ORDER — BUPIVACAINE HCL (PF) 0.25 % IJ SOLN
INTRAMUSCULAR | Status: DC | PRN
  Administered 2016-12-25: 10 mL

## 2016-12-25 MED ORDER — ONDANSETRON HCL 4 MG/2ML IJ SOLN
INTRAMUSCULAR | Status: AC
  Filled 2016-12-25: qty 2

## 2016-12-25 MED ORDER — METOCLOPRAMIDE HCL 5 MG/ML IJ SOLN: 5.0000 mg | Freq: Three times a day (TID) | INTRAMUSCULAR | Status: DC | PRN

## 2016-12-25 MED ORDER — BACLOFEN 10 MG PO TABS: 10.0000 mg | ORAL_TABLET | Freq: Three times a day (TID) | ORAL | 0 refills | Status: DC

## 2016-12-25 MED ORDER — FENTANYL CITRATE (PF) 100 MCG/2ML IJ SOLN
INTRAMUSCULAR | Status: AC
  Filled 2016-12-25: qty 2

## 2016-12-25 MED ORDER — MECLIZINE HCL 25 MG PO TABS
25.0000 mg | ORAL_TABLET | Freq: Three times a day (TID) | ORAL | Status: DC | PRN
Start: 2016-12-25 — End: 2016-12-27
  Filled 2016-12-25: qty 1

## 2016-12-25 MED ORDER — LIDOCAINE 2% (20 MG/ML) 5 ML SYRINGE
INTRAMUSCULAR | Status: DC | PRN
  Administered 2016-12-25: 40 mg via INTRAVENOUS

## 2016-12-25 MED ORDER — TRAMADOL HCL 50 MG PO TABS: 50.0000 mg | ORAL_TABLET | Freq: Four times a day (QID) | ORAL | Status: DC | PRN

## 2016-12-25 MED ORDER — LOSARTAN POTASSIUM 50 MG PO TABS
50.0000 mg | ORAL_TABLET | Freq: Two times a day (BID) | ORAL | Status: DC
  Administered 2016-12-25 – 2016-12-27 (×4): 50 mg via ORAL
  Filled 2016-12-25 (×4): qty 1

## 2016-12-25 MED ORDER — POTASSIUM CHLORIDE IN NACL 20-0.45 MEQ/L-% IV SOLN
INTRAVENOUS | Status: DC
  Administered 2016-12-25: 17:00:00 via INTRAVENOUS
  Filled 2016-12-25 (×2): qty 1000

## 2016-12-25 MED ORDER — AMLODIPINE BESYLATE 5 MG PO TABS
5.0000 mg | ORAL_TABLET | Freq: Every day | ORAL | Status: DC
  Administered 2016-12-26 – 2016-12-27 (×2): 5 mg via ORAL
  Filled 2016-12-25 (×2): qty 1

## 2016-12-25 MED ORDER — DOCUSATE SODIUM 100 MG PO CAPS
100.0000 mg | ORAL_CAPSULE | Freq: Two times a day (BID) | ORAL | Status: DC
  Administered 2016-12-25 – 2016-12-27 (×5): 100 mg via ORAL
  Filled 2016-12-25 (×5): qty 1

## 2016-12-25 MED ORDER — BUPIVACAINE HCL (PF) 0.25 % IJ SOLN
INTRAMUSCULAR | Status: AC
  Filled 2016-12-25: qty 30

## 2016-12-25 MED ORDER — FENTANYL CITRATE (PF) 100 MCG/2ML IJ SOLN: 25.0000 ug | INTRAMUSCULAR | Status: DC | PRN

## 2016-12-25 MED ORDER — ROCURONIUM BROMIDE 10 MG/ML (PF) SYRINGE
PREFILLED_SYRINGE | INTRAVENOUS | Status: DC | PRN
  Administered 2016-12-25: 50 mg via INTRAVENOUS
  Administered 2016-12-25: 10 mg via INTRAVENOUS

## 2016-12-25 MED ORDER — LIDOCAINE 2% (20 MG/ML) 5 ML SYRINGE
INTRAMUSCULAR | Status: AC
  Filled 2016-12-25: qty 5

## 2016-12-25 MED ORDER — POLYVINYL ALCOHOL 1.4 % OP SOLN
1.0000 [drp] | Freq: Every day | OPHTHALMIC | Status: DC
  Administered 2016-12-25: 1 [drp] via OPHTHALMIC
  Administered 2016-12-26: 2 [drp] via OPHTHALMIC
  Filled 2016-12-25: qty 15

## 2016-12-25 MED ORDER — FENTANYL CITRATE (PF) 100 MCG/2ML IJ SOLN
INTRAMUSCULAR | Status: DC | PRN
Start: 2016-12-25 — End: 2016-12-25
  Administered 2016-12-25: 50 ug via INTRAVENOUS

## 2016-12-25 MED ORDER — ONDANSETRON HCL 4 MG PO TABS: 4.0000 mg | ORAL_TABLET | Freq: Four times a day (QID) | ORAL | Status: DC | PRN

## 2016-12-25 MED ORDER — LOSARTAN POTASSIUM-HCTZ 50-12.5 MG PO TABS: 1.0000 | ORAL_TABLET | Freq: Two times a day (BID) | ORAL | Status: DC

## 2016-12-25 MED ORDER — OCUVITE-LUTEIN PO CAPS
1.0000 | ORAL_CAPSULE | Freq: Two times a day (BID) | ORAL | Status: DC
  Administered 2016-12-25: 1 via ORAL
  Filled 2016-12-25 (×2): qty 1

## 2016-12-25 MED ORDER — DIPHENHYDRAMINE HCL 12.5 MG/5ML PO ELIX: 12.5000 mg | ORAL_SOLUTION | ORAL | Status: DC | PRN

## 2016-12-25 MED ORDER — FLUTICASONE PROPIONATE 50 MCG/ACT NA SUSP
1.0000 | Freq: Every evening | NASAL | Status: DC | PRN
  Filled 2016-12-25: qty 16

## 2016-12-25 MED ORDER — BISACODYL 10 MG RE SUPP: 10.0000 mg | Freq: Every day | RECTAL | Status: DC | PRN

## 2016-12-25 MED ORDER — PROPOFOL 10 MG/ML IV BOLUS
INTRAVENOUS | Status: AC
  Filled 2016-12-25: qty 40

## 2016-12-25 MED ORDER — GABAPENTIN 300 MG PO CAPS
300.0000 mg | ORAL_CAPSULE | Freq: Three times a day (TID) | ORAL | Status: DC
  Administered 2016-12-25 – 2016-12-27 (×7): 300 mg via ORAL
  Filled 2016-12-25 (×7): qty 1

## 2016-12-25 MED ORDER — ONDANSETRON HCL 4 MG/2ML IJ SOLN: 4.0000 mg | Freq: Once | INTRAMUSCULAR | Status: DC | PRN

## 2016-12-25 MED ORDER — OXYCODONE HCL 5 MG PO TABS
5.0000 mg | ORAL_TABLET | ORAL | Status: DC | PRN
  Administered 2016-12-25 – 2016-12-26 (×7): 10 mg via ORAL
  Filled 2016-12-25 (×7): qty 2

## 2016-12-25 MED ORDER — CEFAZOLIN IN D5W 1 GM/50ML IV SOLN
1.0000 g | Freq: Four times a day (QID) | INTRAVENOUS | Status: AC
  Administered 2016-12-25 – 2016-12-26 (×3): 1 g via INTRAVENOUS
  Filled 2016-12-25 (×3): qty 50

## 2016-12-25 MED ORDER — ATORVASTATIN CALCIUM 10 MG PO TABS
10.0000 mg | ORAL_TABLET | Freq: Every day | ORAL | Status: DC
  Administered 2016-12-26 – 2016-12-27 (×2): 10 mg via ORAL
  Filled 2016-12-25 (×2): qty 1

## 2016-12-25 MED ORDER — PROPOFOL 10 MG/ML IV BOLUS
INTRAVENOUS | Status: DC | PRN
  Administered 2016-12-25: 150 mg via INTRAVENOUS

## 2016-12-25 MED ORDER — OXYCODONE-ACETAMINOPHEN 10-325 MG PO TABS: 1.0000 | ORAL_TABLET | Freq: Four times a day (QID) | ORAL | 0 refills | Status: DC | PRN

## 2016-12-25 MED ORDER — ACETAMINOPHEN 650 MG RE SUPP: 650.0000 mg | Freq: Four times a day (QID) | RECTAL | Status: DC | PRN

## 2016-12-25 MED ORDER — PHENOL 1.4 % MT LIQD: 1.0000 | OROMUCOSAL | Status: DC | PRN

## 2016-12-25 MED ORDER — OXYCODONE HCL 5 MG/5ML PO SOLN: 5.0000 mg | Freq: Once | ORAL | Status: DC | PRN

## 2016-12-25 MED ORDER — ROCURONIUM BROMIDE 50 MG/5ML IV SOSY
PREFILLED_SYRINGE | INTRAVENOUS | Status: AC
  Filled 2016-12-25: qty 10

## 2016-12-25 MED ORDER — APIXABAN 5 MG PO TABS
5.0000 mg | ORAL_TABLET | Freq: Two times a day (BID) | ORAL | Status: DC
  Administered 2016-12-26 – 2016-12-27 (×3): 5 mg via ORAL
  Filled 2016-12-25 (×3): qty 1

## 2016-12-25 MED ORDER — CEFAZOLIN SODIUM-DEXTROSE 2-4 GM/100ML-% IV SOLN
2.0000 g | INTRAVENOUS | Status: AC
  Administered 2016-12-25: 2 g via INTRAVENOUS

## 2016-12-25 MED ORDER — HYDROMORPHONE HCL 2 MG/ML IJ SOLN: 0.5000 mg | INTRAMUSCULAR | Status: DC | PRN

## 2016-12-25 MED ORDER — EPHEDRINE SULFATE-NACL 50-0.9 MG/10ML-% IV SOSY
PREFILLED_SYRINGE | INTRAVENOUS | Status: DC | PRN
  Administered 2016-12-25 (×2): 5 mg via INTRAVENOUS
  Administered 2016-12-25: 10 mg via INTRAVENOUS

## 2016-12-25 MED ORDER — EPHEDRINE 5 MG/ML INJ
INTRAVENOUS | Status: AC
  Filled 2016-12-25: qty 10

## 2016-12-25 MED ORDER — SENNA 8.6 MG PO TABS
1.0000 | ORAL_TABLET | Freq: Two times a day (BID) | ORAL | Status: DC
  Administered 2016-12-25 – 2016-12-27 (×4): 8.6 mg via ORAL
  Filled 2016-12-25 (×4): qty 1

## 2016-12-25 MED ORDER — HYDROCHLOROTHIAZIDE 12.5 MG PO CAPS
12.5000 mg | ORAL_CAPSULE | Freq: Two times a day (BID) | ORAL | Status: DC
  Administered 2016-12-25 – 2016-12-27 (×4): 12.5 mg via ORAL
  Filled 2016-12-25 (×4): qty 1

## 2016-12-25 MED ORDER — POLYETHYLENE GLYCOL 3350 17 G PO PACK
17.0000 g | PACK | Freq: Every day | ORAL | Status: DC | PRN
Start: 2016-12-25 — End: 2016-12-27

## 2016-12-25 MED ORDER — LACTATED RINGERS IV SOLN
INTRAVENOUS | Status: DC
  Administered 2016-12-25 (×2): via INTRAVENOUS
  Administered 2016-12-25: 50 mL/h via INTRAVENOUS

## 2016-12-25 MED ORDER — SUGAMMADEX SODIUM 500 MG/5ML IV SOLN
INTRAVENOUS | Status: DC | PRN
  Administered 2016-12-25: 207.8 mg via INTRAVENOUS

## 2016-12-25 MED ORDER — SENNA-DOCUSATE SODIUM 8.6-50 MG PO TABS: 2.0000 | ORAL_TABLET | Freq: Every day | ORAL | 1 refills | Status: DC

## 2016-12-25 MED ORDER — METOCLOPRAMIDE HCL 5 MG PO TABS: 5.0000 mg | ORAL_TABLET | Freq: Three times a day (TID) | ORAL | Status: DC | PRN

## 2016-12-25 MED ORDER — ONDANSETRON HCL 4 MG PO TABS: 4.0000 mg | ORAL_TABLET | Freq: Three times a day (TID) | ORAL | 0 refills | Status: DC | PRN

## 2016-12-25 MED ORDER — 0.9 % SODIUM CHLORIDE (POUR BTL) OPTIME
TOPICAL | Status: DC | PRN
  Administered 2016-12-25: 1000 mL

## 2016-12-25 MED ORDER — ONDANSETRON HCL 4 MG/2ML IJ SOLN
INTRAMUSCULAR | Status: DC | PRN
  Administered 2016-12-25: 4 mg via INTRAVENOUS

## 2016-12-25 MED ORDER — PHENYLEPHRINE 40 MCG/ML (10ML) SYRINGE FOR IV PUSH (FOR BLOOD PRESSURE SUPPORT)
PREFILLED_SYRINGE | INTRAVENOUS | Status: AC
  Filled 2016-12-25: qty 10

## 2016-12-25 MED ORDER — MIDAZOLAM HCL 2 MG/2ML IJ SOLN
INTRAMUSCULAR | Status: AC
  Administered 2016-12-25: 1 mg
  Filled 2016-12-25: qty 2

## 2016-12-25 MED ORDER — ONDANSETRON HCL 4 MG/2ML IJ SOLN: 4.0000 mg | Freq: Four times a day (QID) | INTRAMUSCULAR | Status: DC | PRN

## 2016-12-25 MED ORDER — ROCURONIUM BROMIDE 50 MG/5ML IV SOSY
PREFILLED_SYRINGE | INTRAVENOUS | Status: AC
  Filled 2016-12-25: qty 5

## 2016-12-25 MED FILL — BACLOFEN 10 MG TABLET: 10 | 17 days supply | Qty: 50 | Fill #0

## 2016-12-25 MED FILL — ONDANSETRON HCL 4 MG TABLET: 4 | 10 days supply | Qty: 30 | Fill #0

## 2016-12-25 MED FILL — SENEXON-S TABLET: 8.6-50 | 60 days supply | Qty: 60 | Fill #0

## 2016-12-25 SURGICAL SUPPLY — 49 items
BLADE SAW SGTL MED 73X18.5 STR (BLADE) ×2 IMPLANT
CAPT SHLDR TOTAL 2 ×2 IMPLANT
CEMENT BONE DEPUY (Cement) ×2 IMPLANT
CLSR STERI-STRIP ANTIMIC 1/2X4 (GAUZE/BANDAGES/DRESSINGS) ×2 IMPLANT
COVER SURGICAL LIGHT HANDLE (MISCELLANEOUS) ×2 IMPLANT
DRAPE ORTHO SPLIT 77X108 STRL (DRAPES) ×2
DRAPE PROXIMA HALF (DRAPES) ×2 IMPLANT
DRAPE SURG ORHT 6 SPLT 77X108 (DRAPES) ×2 IMPLANT
DRAPE U-SHAPE 47X51 STRL (DRAPES) ×2 IMPLANT
DRSG MEPILEX BORDER 4X8 (GAUZE/BANDAGES/DRESSINGS) ×2 IMPLANT
DURAPREP 26ML APPLICATOR (WOUND CARE) ×2 IMPLANT
ELECT REM PT RETURN 9FT ADLT (ELECTROSURGICAL) ×2
ELECTRODE REM PT RTRN 9FT ADLT (ELECTROSURGICAL) ×1 IMPLANT
GLOVE BIOGEL PI ORTHO PRO SZ8 (GLOVE) ×2
GLOVE ORTHO TXT STRL SZ7.5 (GLOVE) ×2 IMPLANT
GLOVE PI ORTHO PRO STRL SZ8 (GLOVE) ×2 IMPLANT
GLOVE SURG ORTHO 8.0 STRL STRW (GLOVE) ×2 IMPLANT
GOWN STRL REUS W/ TWL XL LVL3 (GOWN DISPOSABLE) ×1 IMPLANT
GOWN STRL REUS W/TWL 2XL LVL3 (GOWN DISPOSABLE) ×2 IMPLANT
GOWN STRL REUS W/TWL XL LVL3 (GOWN DISPOSABLE) ×1
HANDPIECE INTERPULSE COAX TIP (DISPOSABLE) ×1
HOOD PEEL AWAY FACE SHEILD DIS (HOOD) ×4 IMPLANT
KIT BASIN OR (CUSTOM PROCEDURE TRAY) ×2 IMPLANT
KIT ROOM TURNOVER OR (KITS) ×2 IMPLANT
MANIFOLD NEPTUNE II (INSTRUMENTS) ×2 IMPLANT
NS IRRIG 1000ML POUR BTL (IV SOLUTION) ×2 IMPLANT
PACK SHOULDER (CUSTOM PROCEDURE TRAY) ×2 IMPLANT
PAD ARMBOARD 7.5X6 YLW CONV (MISCELLANEOUS) ×4 IMPLANT
PIN HUMERAL STMN 3.2MMX9IN (INSTRUMENTS) IMPLANT
SET HNDPC FAN SPRY TIP SCT (DISPOSABLE) ×1 IMPLANT
SLING ARM IMMOBILIZER LRG (SOFTGOODS) IMPLANT
SLING ARM IMMOBILIZER MED (SOFTGOODS) ×2 IMPLANT
SMARTMIX MINI TOWER (MISCELLANEOUS) ×2
SUCTION FRAZIER HANDLE 10FR (MISCELLANEOUS) ×1
SUCTION TUBE FRAZIER 10FR DISP (MISCELLANEOUS) ×1 IMPLANT
SUPPORT WRAP ARM LG (MISCELLANEOUS) ×2 IMPLANT
SUT FIBERWIRE #2 38 REV NDL BL (SUTURE) ×12
SUT MNCRL AB 4-0 PS2 18 (SUTURE) IMPLANT
SUT VIC AB 0 CT1 27 (SUTURE) ×1
SUT VIC AB 0 CT1 27XBRD ANBCTR (SUTURE) ×1 IMPLANT
SUT VIC AB 2-0 CT1 27 (SUTURE) ×1
SUT VIC AB 2-0 CT1 TAPERPNT 27 (SUTURE) ×1 IMPLANT
SUT VIC AB 3-0 SH 8-18 (SUTURE) ×2 IMPLANT
SUTURE FIBERWR#2 38 REV NDL BL (SUTURE) ×6 IMPLANT
TOWEL OR 17X24 6PK STRL BLUE (TOWEL DISPOSABLE) ×2 IMPLANT
TOWEL OR 17X26 10 PK STRL BLUE (TOWEL DISPOSABLE) ×2 IMPLANT
TOWER SMARTMIX MINI (MISCELLANEOUS) ×1 IMPLANT
TUBE CONNECTING 12X1/4 (SUCTIONS) IMPLANT
YANKAUER SUCT BULB TIP NO VENT (SUCTIONS) ×2 IMPLANT

## 2016-12-25 NOTE — Discharge Instructions (Signed)

## 2016-12-25 NOTE — Progress Notes (Signed)
Orthopedic Tech Progress Note Patient Details:  Jose [name] [birth year] [id number] Patient has sling Patient ID: Jose Roussel., male   DOB: 10-Mar-1936, 81 y.o.   MRN: [id number]   Jose [name] 12/25/2016, 5:03 PM

## 2016-12-25 NOTE — Anesthesia Procedure Notes (Signed)
Procedure Name: Intubation Date/Time: 12/25/2016 11:18 AM Performed by: Everlean Cherry A Pre-anesthesia Checklist: Patient identified, Emergency Drugs available, Suction available and Patient being monitored Patient Re-evaluated:Patient Re-evaluated prior to inductionOxygen Delivery Method: Circle system utilized Preoxygenation: Pre-oxygenation with 100% oxygen Intubation Type: IV induction Ventilation: Mask ventilation without difficulty and Oral airway inserted - appropriate to patient size Laryngoscope Size: Glidescope and 4 Grade View: Grade I Tube type: Oral Tube size: 7.5 mm Number of attempts: 1 Airway Equipment and Method: Rigid stylet and Video-laryngoscopy Placement Confirmation: ETT inserted through vocal cords under direct vision,  positive ETCO2 and breath sounds checked- equal and bilateral Secured at: 24 cm Tube secured with: Tape Dental Injury: Teeth and Oropharynx as per pre-operative assessment  Difficulty Due To: Difficulty was anticipated and Difficult Airway- due to reduced neck mobility

## 2016-12-25 NOTE — Transfer of Care (Signed)
Immediate Anesthesia Transfer of Care Note  Patient: Jose [name].  Procedure(s) Performed: Procedure(s): TOTAL SHOULDER ARTHROPLASTY (Left)  Patient Location: PACU  Anesthesia Type:GA combined with regional for post-op pain  Level of Consciousness: awake, alert , oriented and patient cooperative  Airway & Oxygen Therapy: Patient Spontanous Breathing and Patient connected to face mask oxygen  Post-op Assessment: Report given to RN and Post -op Vital signs reviewed and stable  Post vital signs: Reviewed and stable  Last Vitals:  Vitals:   12/25/16 0900 12/25/16 1350  BP: 138/81   Pulse: (!) 57   Resp: 13   Temp:  (P) 36.6 C    Last Pain:  Vitals:   12/25/16 1350  TempSrc:   PainSc: (P) Asleep      Patients Stated Pain Goal: 5 ([id number])  Complications: No apparent anesthesia complications

## 2016-12-25 NOTE — Anesthesia Preprocedure Evaluation (Signed)
Anesthesia Evaluation  Patient identified by MRN, date of birth, ID band Patient awake    Reviewed: Allergy & Precautions, NPO status , Patient's Chart, lab work & pertinent test results  Airway Mallampati: II  TM Distance: >3 FB Neck ROM: Full    Dental  (+) Teeth Intact, Dental Advisory Given   Pulmonary former smoker,    breath sounds clear to auscultation       Cardiovascular hypertension,  Rhythm:Regular Rate:Normal     Neuro/Psych    GI/Hepatic   Endo/Other    Renal/GU      Musculoskeletal   Abdominal   Peds  Hematology   Anesthesia Other Findings   Reproductive/Obstetrics                             Anesthesia Physical Anesthesia Plan  ASA: III  Anesthesia Plan: General and Regional   Post-op Pain Management:    Induction: Intravenous  Airway Management Planned: Oral ETT  Additional Equipment:   Intra-op Plan:   Post-operative Plan: Extubation in OR  Informed Consent: I have reviewed the patients History and Physical, chart, labs and discussed the procedure including the risks, benefits and alternatives for the proposed anesthesia with the patient or authorized representative who has indicated his/her understanding and acceptance.   Dental advisory given  Plan Discussed with: CRNA and Anesthesiologist  Anesthesia Plan Comments:         Anesthesia Quick Evaluation

## 2016-12-25 NOTE — Op Note (Signed)
12/25/2016  1:20 PM  PATIENT:  Jose [name].    PRE-OPERATIVE DIAGNOSIS:  left shoulder primary localized osteoarthritis  POST-OPERATIVE DIAGNOSIS:  Same  PROCEDURE:  Left TOTAL SHOULDER ARTHROPLASTY  SURGEON:  Johnny Bridge, MD  PHYSICIAN ASSISTANT: Joya Gaskins, OPA-C, present and scrubbed throughout the case, critical for completion in a timely fashion, and for retraction, instrumentation, and closure.  ANESTHESIA:   General with regional block  PREOPERATIVE INDICATIONS:  Jose Peebles. is a  81 y.o. male with a diagnosis of djd left shoulder who failed conservative measures and elected for surgical management.    The risks benefits and alternatives were discussed with the patient preoperatively including but not limited to the risks of infection, bleeding, nerve injury, cardiopulmonary complications, the need for revision surgery, dislocation, loosening, incomplete relief of pain, among others, and the patient was willing to proceed.   OPERATIVE IMPLANTS: Biomet size 14 micro press-fit humeral stem, size 46+18 Versa-dial humeral head, set in the B position with increased coverage inferior posterior, with a medium cemented glenoid polyethylene 3 peg implant with a central regenerex noncemented post.   OPERATIVE FINDINGS: Advanced glenohumeral osteoarthritis involving the glenoid and the humeral head with substantial osteophyte formation inferiorly. After all the implants were in, the humeral head was fairly hypermobile within the glenohumeral cavity. I could translate at least a 50% posteriorly prior to repair of the subscapularis.   OPERATIVE PROCEDURE: The patient was brought to the operating room and placed in the supine position. General anesthesia was administered. IV antibiotics were given.  The upper extremity was prepped and draped in usual sterile fashion. The patient was in a beachchair position with all bony prominences padded.   Time out was performed and a  deltopectoral approach was carried out. The biceps tendon was tenodesed to the pectoralis tendon. The subscapularis was released, tagging it with a #2 FiberWire, leaving a cuff of tendon for repair.   The inferior osteophyte was removed, and release of the capsule off of the humeral side was completed. Getting complete release inferiorly around the very sizable osteophyte was challenging, but I performed this with the Bovie and did not get any stimulation of the axillary nerve.   The head was dislocated, and I reamed sequentially. I placed the humeral cutting guide at 30 of retroversion, and then pinned this into place, and made my humeral neck cut. This was at the appropriate level.   I then placed deep retractors and exposed the glenoid. I excised the labrum circumferentially, taking care to protect the axillary nerve inferiorly.   I then placed a guidewire into the center position, controlling appropriate version and inclination. I then reamed over the guidewire with the small reamer, and was satisfied with the preparation. I preserved the subchondral bone in order to maximize the strength and minimize the risk for subsequent subsidence.   I then drilled the central hole for the regenerex peg, and then placed the guide, and then drilled the 3 peripheral peg holes. I had excellent bony circumferential contact. All 4 holes were contained within the vault.  I then cleaned the glenoid, irrigated it copiously, and then dried it and cemented the prosthesis into place. Excellent seating was achieved. I had full exposure. The cement cured, and then I turned my attention to the humeral side.   I sequentially broached, up to the selected size, with the broach set at 30 of retroversion. I then placed the real stem. I trialed with multiple heads, and the  above-named component was selected. Increased posterior coverage improved the coverage. The soft tissue tension was appropriate.   I then impacted the real  humeral head into place, reduced the head, and irrigated copiously. Excellent stability and range of motion was achieved. I repaired the subscapularis with 4 #2 FiberWire, as well as the rotator interval, and irrigated copiously once more. The subcutaneous tissue was closed with Vicryl including the deltopectoral fascia.   The skin was closed with Steri-Strips and sterile gauze was applied. He had a preoperative nerve block. He tolerated the procedure well and there were no complications.

## 2016-12-25 NOTE — H&P (Signed)
PREOPERATIVE H&P  Chief Complaint: djd left shoulder  HPI: Jose [name]. is a 81 y.o. male who presents for preoperative history and physical with a diagnosis of djd left shoulder. Symptoms are rated as moderate to severe, and have been worsening.  This is significantly impairing activities of daily living.  He has elected for surgical management.   He has failed injections, activity modification, anti-inflammatories, and assistive devices.  Preoperative X-rays demonstrate end stage degenerative changes with osteophyte formation, loss of joint space, subchondral sclerosis.  He's had previous arthroscopic intervention on the left side as well, that did not demonstrate full-thickness tear, but did demonstrate arthritic changes.  Past Medical History:  Diagnosis Date  . Arthritis   . Dysrhythmia    afib  . Gallstones 07/1996  . Headache   . History of CT scan of head 1997   small vessel disease  . History of ETT 05/2002    no EKG changes  . History of MRI of cervical spine 07/15/2002  . Hypertension   . Lumbar spondylolysis 12/17/2016  . MGUS (monoclonal gammopathy of unknown significance) 04/11/2012  . Peripheral neuropathy ([place])   . Pneumonia 10/2014  . PONV (postoperative nausea and vomiting)    once many years ago  . Rectal bleeding 12/2002   colonoscopy  . Seizures ([place])    last seizure 1976  . Skin cancer   . Sleep apnea    ? to have study after surgery  . Stroke [facility])    ?tia ?afib  . TIA (transient ischemic attack)   . 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 ADENOIDECTOMY    . ANTERIOR CERVICAL DECOMP/DISCECTOMY FUSION N/A 02/04/2015   Procedure: ANTERIOR CERVICAL DECOMPRESSION/DISCECTOMY FUSION CERVICAL FIVE-SIX,CERVICAL SIX-SEVEN;  Surgeon: Karie Chimera, MD;  Location: [facility];  Service: Neurosurgery;  Laterality: N/A;  . CATARACT  EXTRACTION Bilateral   . COLONOSCOPY W/ POLYPECTOMY  04/19/2003   tubular adenoma  . GREAT TOE ARTHRODESIS, INTERPHALANGEAL JOINT Left 1978  . keratosis excision Right 05/1998   wrist  . nerve transfer surgery to lwft hand Left    Nov. 2017  . SHOULDER ARTHROSCOPY Left 13   rotator cuff   . SHOULDER INJECTION  June 2012   left, Sombrillo  . SKIN CANCER EXCISION  07/1993   left lateral arm  . thumb injection  June 2012   left, [place]  . TONSILLECTOMY    . ulnar nerve transfer  12/1999   left   Social History   Social History  . Marital status: Married    Spouse name: Jose [name]  . Number of children: 2  . Years of education: 39   Occupational History  . retired Unemployed   Social History Main Topics  . Smoking status: Former Smoker    Years: 15.00    Quit date: 03/05/1968  . Smokeless tobacco: Never Used  . Alcohol use 1.2 oz/week    2 Standard drinks or equivalent per week     Comment: 2-3 times a month- will have a glass of wine  . Drug use: No  . Sexual activity: Not Asked   Other Topics Concern  . None   Social History Narrative   Lives with 4th wife, Jose [name], married 2010   Retired, organizational psychology   [employer]   Son, Jose [name], [name], 2 children  Daughter, Jose [name], in [name], 2 children   Patient is right handed   Patient drinks approximately 3-4 cups of caffeine daily   Family History  Problem Relation Age of Onset  . Dementia Mother 31    vascular  . Transient ischemic attack Mother   . Dementia Father     after hip fracture  . Hypertension Sister     skin cancer  . Seizures Neg Hx    Allergies  Allergen Reactions  . Maprotiline     TETRACYCLIC ANTIDEPRESSANTS UNSPECIFIED REACTION (patient is unaware of allergy)  . Tetracycline Hcl Itching and Rash    Rash on hands   Prior to Admission medications   Medication Sig Start Date End Date Taking? Authorizing Provider  amLODipine (NORVASC) 5 MG tablet Take 5 mg  by mouth daily.   Yes Historical Provider, MD  atorvastatin (LIPITOR) 10 MG tablet Take 10 mg by mouth daily.   Yes Historical Provider, MD  ELIQUIS 5 MG TABS tablet TAKE 1 TABLET TWICE A DAY 09/25/16  Yes Kathrynn Ducking, MD  fluticasone [facility]) 50 MCG/ACT nasal spray Place 1 spray into both nostrils at bedtime as needed (for nasal congestion.).    Yes Historical Provider, MD  gabapentin (NEURONTIN) 300 MG capsule One capsule twice during the day and 2 at night 12/17/16  Yes Kathrynn Ducking, MD  labetalol (NORMODYNE) 200 MG tablet Take 100 mg by mouth 2 (two) times daily.   Yes Historical Provider, MD  losartan-hydrochlorothiazide (HYZAAR) 50-12.5 MG per tablet Take 1 tablet by mouth 2 (two) times daily.   Yes Historical Provider, MD  Multiple Vitamin (MULTIVITAMIN WITH MINERALS) TABS tablet Take 1 tablet by mouth daily. Centrum Silver   Yes Historical Provider, MD  Multiple Vitamins-Minerals (PRESERVISION AREDS 2) CAPS Take 1 capsule by mouth 2 (two) times daily.    Yes Historical Provider, MD  Omega-3 Fatty Acids (FISH OIL) 1000 MG CAPS Take 1,000 mg by mouth 2 (two) times daily.    Yes Historical Provider, MD  meclizine (ANTIVERT) 25 MG tablet Take 1 tablet (25 mg total) by mouth 3 (three) times daily as needed. Patient taking differently: Take 25 mg by mouth 3 (three) times daily as needed for dizziness.  02/22/16   Tanna Furry, MD  oxyCODONE-acetaminophen (PERCOCET/ROXICET) 5-325 MG tablet Take 0.5-1 tablets by mouth every 6 (six) hours as needed. For severe shoulder pain 09/24/16   Historical Provider, MD  Propylene Glycol (SYSTANE BALANCE) 0.6 % SOLN Place 1-2 drops into both eyes at bedtime.    Historical Provider, MD  traMADol (ULTRAM) 50 MG tablet Take 1 tablet (50 mg total) by mouth every 6 (six) hours as needed. 01/24/16   Kathrynn Ducking, MD     Positive ROS: All other systems have been reviewed and were otherwise negative with the exception of those mentioned in the HPI and as  above.  Physical Exam: General: Alert, no acute distress Cardiovascular: No pedal edema Respiratory: No cyanosis, no use of accessory musculature GI: No organomegaly, abdomen is soft and non-tender Skin: No lesions in the area of chief complaint Neurologic: Sensation intact distally Psychiatric: Patient is competent for consent with normal mood and affect Lymphatic: No axillary or cervical lymphadenopathy  MUSCULOSKELETAL: Left shoulder active motion 0-100 with external rotation lacking 5 of neutral, positive crepitance, cuff strength is fair.  Assessment: djd left shoulder   Plan: Plan for Procedure(s): TOTAL SHOULDER ARTHROPLASTY  The risks benefits and alternatives were discussed with the patient including but not  limited to the risks of nonoperative treatment, versus surgical intervention including infection, bleeding, nerve injury,  blood clots, cardiopulmonary complications, morbidity, mortality, among others, and they were willing to proceed.   Johnny Bridge, MD Cell [phone]   12/25/2016 9:57 AM

## 2016-12-25 NOTE — Anesthesia Postprocedure Evaluation (Addendum)
Anesthesia Post Note  Patient: Jose [name].  Procedure(s) Performed: Procedure(s) (LRB): TOTAL SHOULDER ARTHROPLASTY (Left)  Patient location during evaluation: PACU Anesthesia Type: Regional Level of consciousness: awake and alert, oriented and patient cooperative Pain management: pain level controlled Vital Signs Assessment: post-procedure vital signs reviewed and stable Respiratory status: spontaneous breathing, nonlabored ventilation, respiratory function stable and patient connected to nasal cannula oxygen Cardiovascular status: blood pressure returned to baseline and stable Postop Assessment: no signs of nausea or vomiting Anesthetic complications: no       Last Vitals:  Vitals:   12/25/16 1507 12/25/16 1522  BP: 108/68 111/70  Pulse: 63 67  Resp: 14 13  Temp:      Last Pain:  Vitals:   12/25/16 1520  TempSrc:   PainSc: Asleep                 Cherika Jessie,E. Paulette [place]

## 2016-12-25 NOTE — Progress Notes (Signed)
Patient placed on CPAP in auto mode. Patient tolerating well at this time. RT will continue to monitor.

## 2016-12-25 NOTE — Anesthesia Procedure Notes (Addendum)
Anesthesia Regional Block: Interscalene brachial plexus block   Pre-Anesthetic Checklist: ,, timeout performed, Correct Patient, Correct Site, Correct Laterality, Correct Procedure, Correct Position, site marked, Risks and benefits discussed,  Surgical consent,  Pre-op evaluation,  At surgeon's request and post-op pain management  Laterality: Left  Prep: chloraprep       Needles:   Needle Type: Echogenic Stimulator Needle     Needle Length: 9cm  Needle Gauge: 21     Additional Needles:   Procedures: ultrasound guided,,,,,,,,  Narrative:  Start time: 12/25/2016 8:45 AM End time: 12/25/2016 8:50 AM Injection made incrementally with aspirations every 5 mL.  Performed by: Personally   Additional Notes: 20 cc 0.75% Naropin injected easily

## 2016-12-26 ENCOUNTER — Encounter (HOSPITAL_COMMUNITY): Payer: Self-pay | Admitting: Orthopedic Surgery

## 2016-12-26 LAB — BASIC METABOLIC PANEL
ANION GAP: 8 (ref 5–15)
BUN: 17 mg/dL (ref 6–20)
CALCIUM: 8.8 mg/dL — AB (ref 8.9–10.3)
CO2: 29 mmol/L (ref 22–32)
Chloride: 100 mmol/L — ABNORMAL LOW (ref 101–111)
Creatinine, Ser: 0.83 mg/dL (ref 0.61–1.24)
GLUCOSE: 120 mg/dL — AB (ref 65–99)
Potassium: 3.2 mmol/L — ABNORMAL LOW (ref 3.5–5.1)
SODIUM: 137 mmol/L (ref 135–145)

## 2016-12-26 LAB — CBC
HCT: 33.5 % — ABNORMAL LOW (ref 39.0–52.0)
Hemoglobin: 10.9 g/dL — ABNORMAL LOW (ref 13.0–17.0)
MCH: 30.9 pg (ref 26.0–34.0)
MCHC: 32.5 g/dL (ref 30.0–36.0)
MCV: 94.9 fL (ref 78.0–100.0)
PLATELETS: 192 10*3/uL (ref 150–400)
RBC: 3.53 MIL/uL — ABNORMAL LOW (ref 4.22–5.81)
RDW: 13.3 % (ref 11.5–15.5)
WBC: 8.2 10*3/uL (ref 4.0–10.5)

## 2016-12-26 MED ORDER — PROSIGHT PO TABS
1.0000 | ORAL_TABLET | Freq: Two times a day (BID) | ORAL | Status: DC
  Administered 2016-12-26 – 2016-12-27 (×3): 1 via ORAL
  Filled 2016-12-26 (×3): qty 1

## 2016-12-26 NOTE — Discharge Summary (Addendum)
Physician Discharge Summary  Patient ID: Jose [name]. MRN: [id number] DOB/AGE: Mar 09, 1936 81 y.o.  Admit date: 12/25/2016 Discharge date: 12/27/2016  Admission Diagnoses:  Primary localized osteoarthrosis of left shoulder  Discharge Diagnoses:  Principal Problem:   Primary localized osteoarthrosis of left shoulder Active Problems:   S/P shoulder replacement   Past Medical History:  Diagnosis Date  . Arthritis   . Dysrhythmia    afib  . Gallstones 07/1996  . Headache   . History of CT scan of head 1997   small vessel disease  . History of ETT 05/2002    no EKG changes  . History of MRI of cervical spine 07/15/2002  . Hypertension   . Lumbar spondylolysis 12/17/2016  . MGUS (monoclonal gammopathy of unknown significance) 04/11/2012  . Peripheral neuropathy ([place])   . Pneumonia 10/2014  . PONV (postoperative nausea and vomiting)    once many years ago  . Primary localized osteoarthrosis of left shoulder 12/25/2016  . Rectal bleeding 12/2002   colonoscopy  . Seizures ([place])    last seizure 1976  . Skin cancer   . Sleep apnea    ? to have study after surgery  . Stroke [facility])    ?tia ?afib  . TIA (transient ischemic attack)   . 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 is an 81 y.o. male who was admitted 12/25/2016 with a diagnosis of Primary localized osteoarthrosis of left shoulder and went to the operating room on 12/25/2016 and underwent the above named procedures.    He was given perioperative antibiotics:  Anti-infectives    Start     Dose/Rate Route Frequency Ordered Stop   12/25/16 1600  ceFAZolin (ANCEF) IVPB 1 g/50 mL premix     1 g 100 mL/hr over 30 Minutes Intravenous Every 6 hours 12/25/16 1537 12/26/16 0433   12/25/16 0758   ceFAZolin (ANCEF) IVPB 2g/100 mL premix     2 g 200 mL/hr over 30 Minutes Intravenous On call to O.R. 12/25/16 [id number] 12/25/16 1138    .  He was given sequential compression devices, early ambulation, eliquis (home med) for DVT prophylaxis.  He benefited maximally from the hospital stay and there were no complications.    Recent vital signs:  Vitals:   12/26/16 2046 12/27/16 0627  BP: 108/60 (!) 98/52  Pulse: 85 (!) 118  Resp: 16   Temp: 99.4 F (37.4 C) (!) 100.4 F (38 C)    Recent laboratory studies:  Lab Results  Component Value Date   HGB 10.9 (L) 12/26/2016   HGB 12.0 (L) 12/14/2016   HGB 12.2 (L) 11/09/2016   Lab Results  Component Value Date   WBC 8.2 12/26/2016   PLT 192 12/26/2016   Lab Results  Component Value Date   INR 1.48 12/14/2016   Lab Results  Component Value Date   NA 137 12/26/2016   K 3.2 (L) 12/26/2016   CL 100 (L) 12/26/2016   CO2 29 12/26/2016   BUN 17 12/26/2016   CREATININE 0.83 12/26/2016   GLUCOSE 120 (H) 12/26/2016    Discharge Medications:   Allergies as of 12/27/2016      Reactions   Maprotiline    TETRACYCLIC ANTIDEPRESSANTS UNSPECIFIED REACTION (patient is unaware of allergy)   Tetracycline  Hcl Itching, Rash   Rash on hands      Medication List    STOP taking these medications   oxyCODONE-acetaminophen 5-325 MG tablet Commonly known as:  PERCOCET/ROXICET Replaced by:  oxyCODONE-acetaminophen 10-325 MG tablet     TAKE these medications   amLODipine 5 MG tablet Commonly known as:  NORVASC Take 5 mg by mouth daily.   atorvastatin 10 MG tablet Commonly known as:  LIPITOR Take 10 mg by mouth daily.   baclofen 10 MG tablet Commonly known as:  LIORESAL Take 1 tablet (10 mg total) by mouth 3 (three) times daily. As needed for muscle spasm   ELIQUIS 5 MG Tabs tablet Generic drug:  apixaban TAKE 1 TABLET TWICE A DAY   Fish Oil 1000 MG Caps Take 1,000 mg by mouth 2 (two) times daily.   fluticasone 50 MCG/ACT  nasal spray Commonly known as:  FLONASE Place 1 spray into both nostrils at bedtime as needed (for nasal congestion.).   gabapentin 300 MG capsule Commonly known as:  NEURONTIN One capsule twice during the day and 2 at night   labetalol 200 MG tablet Commonly known as:  NORMODYNE Take 100 mg by mouth 2 (two) times daily.   losartan-hydrochlorothiazide 50-12.5 MG tablet Commonly known as:  HYZAAR Take 1 tablet by mouth 2 (two) times daily.   meclizine 25 MG tablet Commonly known as:  ANTIVERT Take 1 tablet (25 mg total) by mouth 3 (three) times daily as needed. What changed:  reasons to take this   multivitamin with minerals Tabs tablet Take 1 tablet by mouth daily. Centrum Silver   ondansetron 4 MG tablet Commonly known as:  ZOFRAN Take 1 tablet (4 mg total) by mouth every 8 (eight) hours as needed for nausea or vomiting.   oxyCODONE-acetaminophen 10-325 MG tablet Commonly known as:  PERCOCET Take 1-2 tablets by mouth every 6 (six) hours as needed for pain. MAXIMUM TOTAL ACETAMINOPHEN DOSE IS 4000 MG PER DAY Replaces:  oxyCODONE-acetaminophen 5-325 MG tablet   PRESERVISION AREDS 2 Caps Take 1 capsule by mouth 2 (two) times daily.   sennosides-docusate sodium 8.6-50 MG tablet Commonly known as:  SENOKOT-S Take 2 tablets by mouth daily.   SYSTANE BALANCE 0.6 % Soln Generic drug:  Propylene Glycol Place 1-2 drops into both eyes at bedtime.   traMADol 50 MG tablet Commonly known as:  ULTRAM Take 1 tablet (50 mg total) by mouth every 6 (six) hours as needed.            Durable Medical Equipment        Start     Ordered   12/26/16 1454  For home use only DME Other see comment  Once    Comments:  Patient needs Kasandra Knudsen   12/26/16 1454      Diagnostic Studies: Dg Shoulder Left Port  Result Date: 12/25/2016 CLINICAL DATA:  Status post left shoulder replacement EXAM: LEFT SHOULDER - 1 VIEW COMPARISON:  None. FINDINGS: Left shoulder prosthesis is now seen.  Degenerative changes of the acromioclavicular joint are noted. No soft tissue or acute bony abnormality is noted. IMPRESSION: Status post left shoulder replacement Electronically Signed   By: Inez Catalina M.D.   On: 12/25/2016 14:23    Disposition: 01-Home or Self Care    Follow-up Information    Valia Wingard P, MD. Schedule an appointment as soon as possible for a visit in 2 week(s).   Specialty:  Orthopedic Surgery Contact information: [place]  [place] [address] [phone]        [facility] Follow up.   Why:  Someone from [facility] will contact you to arrange start date and time for therapy. Contact information: [address] [place] [address] [phone]            Signed: Johnny Bridge 12/27/2016, 12:21 PM

## 2016-12-26 NOTE — Progress Notes (Signed)
Patient states he will place himself on CPAP when ready. RT filled water chamber with sterile water. RT informed patient if he has any trouble to have RN contact RT.

## 2016-12-26 NOTE — Progress Notes (Signed)
Pt and pt's spouse stated they would feel more comfortable if the pt could stay another night. Called Dr. Mardelle Matte with an update and received verbal confirmation that pt is allowed to stay another. Pt and spouse updated. Will continue to monitor

## 2016-12-26 NOTE — Evaluation (Signed)
Occupational Therapy Evaluation Patient Details Name: Jose [name]. MRN: [id number] DOB: 1936/07/21 Today's Date: 12/26/2016    History of Present Illness 81 yo male s/p L TSA conservative protocol with sling   Clinical Impression   Patient is s/p L TSA surgery resulting in functional limitations due to the deficits listed below (see OT problem list). Pt currently requires mod/ max (A) for adls due to L Ue deficits. Pt with balance deficits with basic transfer. Pt does report pain is minimal at this time.  Patient will benefit from skilled OT acutely to increase independence and safety with ADLS to allow discharge home. (follow up in two weeks with MD recommending any follow up therapy around 4 weeks)     Follow Up Recommendations  No OT follow up    Equipment Recommendations  3 in 1 bedside commode;Other (comment) (defer to PT possible cane need)    Recommendations for Other Services       Precautions / Restrictions Precautions Precautions: Shoulder Type of Shoulder Precautions: conservative Shoulder Interventions: Shoulder sling/immobilizer;At all times Precaution Comments: shoulder handout provided and reviewed Restrictions LUE Weight Bearing: Non weight bearing      Mobility Bed Mobility Overal bed mobility: Needs Assistance Bed Mobility: Supine to Sit     Supine to sit: Supervision     General bed mobility comments: requires bil LE off bed and rocking to build momentum to transfer  Transfers Overall transfer level: Needs assistance Equipment used: 1 person hand held assist Transfers: Sit to/from Stand Sit to Stand: Min assist              Balance                                            ADL Overall ADL's : Needs assistance/impaired Eating/Feeding: Set up   Grooming: Oral care;Set up;Standing   Upper Body Bathing: Moderate assistance   Lower Body Bathing: Moderate assistance           Toilet Transfer: Minimal  assistance           Functional mobility during ADLs: Minimal assistance General ADL Comments: pt unsteady and requires (A) for balance. decrease gait velocity. pt reports balance having issues due to back and hip hx  .shoulder   Vision Baseline Vision/History: Wears glasses Wears Glasses: Reading only       Perception     Praxis      Pertinent Vitals/Pain Pain Assessment: 0-10 Pain Score: 1  Pain Location: shoulder Pain Descriptors / Indicators: Sore Pain Intervention(s): Premedicated before session;Monitored during session;Repositioned;Ice applied     Hand Dominance Right   Extremity/Trunk Assessment Upper Extremity Assessment Upper Extremity Assessment: LUE deficits/detail LUE Deficits / Details: s/p surgery; hx of ulnar nerve repair x2 educated on support of pillow to allow elbow extension slightly to help with comfort    Lower Extremity Assessment Lower Extremity Assessment: LLE deficits/detail LLE Deficits / Details: L hip arthritis   Cervical / Trunk Assessment Cervical / Trunk Assessment: Other exceptions Cervical / Trunk Exceptions: MRI (+) for spinal changes   Communication Communication Communication: No difficulties   Cognition Arousal/Alertness: Awake/alert Behavior During Therapy: WFL for tasks assessed/performed Overall Cognitive Status: Within Functional Limits for tasks assessed                     General Comments  Exercises       Shoulder Instructions      Home Living Family/patient expects to be discharged to:: Private residence Living Arrangements: Spouse/significant other Available Help at Discharge: Family;Available 24 hours/day Type of Home: Apartment   Entrance Stairs-Number of Steps: 0         Bathroom Shower/Tub: Teacher, early years/pre: Standard     Home Equipment: None   Additional Comments: sold house and closed on a new house but currenlty in apartment      Prior  Functioning/Environment Level of Independence: Independent                 OT Problem List: Decreased strength;Decreased activity tolerance;Impaired balance (sitting and/or standing);Decreased safety awareness;Decreased knowledge of use of DME or AE;Decreased knowledge of precautions;Pain;Impaired UE functional use      OT Treatment/Interventions: Self-care/ADL training;Therapeutic exercise;Energy conservation;DME and/or AE instruction;Therapeutic activities;Patient/family education;Balance training    OT Goals(Current goals can be found in the care plan section) Acute Rehab OT Goals Patient Stated Goal: to return home and play suduko  OT Goal Formulation: With patient Time For Goal Achievement: 01/09/17 Potential to Achieve Goals: Good  OT Frequency: Min 2X/week   Barriers to D/C:            Co-evaluation              End of Session Equipment Utilized During Treatment: Gait belt;Other (comment) (sling) Nurse Communication: Mobility status;Precautions;Weight bearing status  Activity Tolerance: Patient tolerated treatment well Patient left: in chair;with call bell/phone within reach  OT Visit Diagnosis: Unsteadiness on feet (R26.81)                ADL either performed or assessed with clinical judgement  Time: [id number] OT Time Calculation (min): 30 min Charges:  OT General Charges $OT Visit: 1 Procedure OT Evaluation $OT Eval High Complexity: 1 Procedure OT Treatments $Self Care/Home Management : 8-22 mins G-Codes:      Jeri Modena   OTR/L Pager: [phone] Office: [phone] .   Parke Poisson B 12/26/2016, 9:54 AM

## 2016-12-26 NOTE — Progress Notes (Signed)
Patient ID: [name], male   DOB: 1936/07/08, 81 y.o.   MRN: [id number]     Subjective:  Patient reports pain as mild.  Patient sitting up in the chair and in no acute distress.    Objective:   VITALS:   Vitals:   12/25/16 1625 12/25/16 2109 12/25/16 2302 12/26/16 0000  BP: 115/74  137/87 138/80  Pulse: 64 69 97 78  Resp: 16 16  18   Temp: 98.8 F (37.1 C)   98.4 F (36.9 C)  TempSrc:    Oral  SpO2: 99% 98%  99%  Weight:      Height:        ABD soft Sensation intact distally Dorsiflexion/Plantar flexion intact Incision: dressing C/D/I and no drainage Patient sensation and wrist and hand motion at baseline from previous ulnar nerve transposition  Lab Results  Component Value Date   WBC 8.2 12/26/2016   HGB 10.9 (L) 12/26/2016   HCT 33.5 (L) 12/26/2016   MCV 94.9 12/26/2016   PLT 192 12/26/2016   BMET    Component Value Date/Time   NA 137 12/26/2016 0543   K 3.2 (L) 12/26/2016 0543   CL 100 (L) 12/26/2016 0543   CO2 29 12/26/2016 0543   GLUCOSE 120 (H) 12/26/2016 0543   BUN 17 12/26/2016 0543   CREATININE 0.83 12/26/2016 0543   CALCIUM 8.8 (L) 12/26/2016 0543   GFRNONAA >60 12/26/2016 0543   GFRAA >60 12/26/2016 0543     Assessment/Plan: 1 Day Post-Op   Principal Problem:   Primary localized osteoarthrosis of left shoulder Active Problems:   S/P shoulder replacement   Advance diet Up with therapy DC home  Sling at all times NWB left upper ext   DOUGLAS PARRY, BRANDON 12/26/2016, 8:15 AM  Seen and agree with above.   Marchia Bond, MD Cell [phone]

## 2016-12-26 NOTE — Evaluation (Signed)
Physical Therapy Evaluation Patient Details Name: Jose [name]. MRN: [id number] DOB: 18-Dec-1935 Today's Date: 12/26/2016   History of Present Illness  81 yo male s/p L TSA conservative protocol with sling  Clinical Impression  Pt is POD 1 following the above procedure. Prior to admission, pt was mostly independent and was mobilizing without an assistive device. Pt lives with his wife in a single level, first floor apartment and his wife performs all driving to MD appointments. Pt was unsteady without an AD and it was suggested that he use a [place] for mobility in order to improve his balance. Pt will benefit from continued instruction with [place] for mobility during his acute care stay and with HHPT once he returns home.     Follow Up Recommendations Home health PT;Supervision for mobility/OOB    Equipment Recommendations  Cane    Recommendations for Other Services       Precautions / Restrictions Precautions Precautions: Shoulder Type of Shoulder Precautions: conservative Shoulder Interventions: Shoulder sling/immobilizer;At all times Precaution Comments: given and reviewed by OT Restrictions Weight Bearing Restrictions: Yes LUE Weight Bearing: Non weight bearing      Mobility  Bed Mobility Overal bed mobility: Needs Assistance Bed Mobility: Supine to Sit     Supine to sit: Supervision     General bed mobility comments: requires bil LE off bed and rocking to build momentum to transfer  Transfers Overall transfer level: Needs assistance Equipment used: Straight cane Transfers: Sit to/from Stand Sit to Stand: Min guard         General transfer comment: Min guard with cues for pushing up from bed  Ambulation/Gait Ambulation/Gait assistance: Min guard Ambulation Distance (Feet): 150 Feet Assistive device: Straight cane Gait Pattern/deviations: Step-through pattern;Narrow base of support Gait velocity: decreased Gait velocity interpretation: Below normal speed for  age/gender General Gait Details: very narrow BOS which cause instability even with [place]. Pt instructed to use  for all mobility  Stairs            Wheelchair Mobility    Modified Rankin (Stroke Patients Only)       Balance Overall balance assessment: Needs assistance Sitting-balance support: No upper extremity supported;Feet supported Sitting balance-Leahy Scale: Good     Standing balance support: Single extremity supported Standing balance-Leahy Scale: Fair Standing balance comment: requries a one handed device to improve mobility                             Pertinent Vitals/Pain Pain Assessment: 0-10 Pain Score: 1  Pain Location: shoulder Pain Descriptors / Indicators: Sore Pain Intervention(s): Premedicated before session;Monitored during session;Ice applied;Repositioned    Home Living Family/patient expects to be discharged to:: Private residence Living Arrangements: Spouse/significant other Available Help at Discharge: Family;Available 24 hours/day Type of Home: Apartment Home Access: Level entry       Home Equipment: None Additional Comments: sold house and closed on a new house but currenlty in apartment    Prior Function Level of Independence: Independent               Hand Dominance   Dominant Hand: Right    Extremity/Trunk Assessment   Upper Extremity Assessment Upper Extremity Assessment: Defer to OT evaluation    Lower Extremity Assessment Lower Extremity Assessment: LLE deficits/detail LLE Deficits / Details: Left hip OA, reports hip locks up with increased gait distance    Cervical / Trunk Assessment Cervical / Trunk Assessment: Other exceptions Cervical /  Trunk Exceptions: MRI (+) for spinal changes  Communication   Communication: No difficulties  Cognition Arousal/Alertness: Awake/alert Behavior During Therapy: WFL for tasks assessed/performed Overall Cognitive Status: Within Functional Limits for tasks  assessed                      General Comments      Exercises     Assessment/Plan    PT Assessment Patient needs continued PT services  PT Problem List Decreased activity tolerance;Decreased balance;Decreased mobility;Decreased knowledge of use of DME;Pain;Decreased strength       PT Treatment Interventions DME instruction;Gait training;Stair training;Functional mobility training;Therapeutic activities;Therapeutic exercise;Balance training;Patient/family education    PT Goals (Current goals can be found in the Care Plan section)  Acute Rehab PT Goals Patient Stated Goal: to return home and play suduko  PT Goal Formulation: With patient Time For Goal Achievement: 01/02/17 Potential to Achieve Goals: Good    Frequency Min 5X/week   Barriers to discharge        Co-evaluation               End of Session Equipment Utilized During Treatment: Gait belt Activity Tolerance: Patient tolerated treatment well Patient left: with call bell/phone within reach;in bed;with family/visitor present Nurse Communication: Mobility status PT Visit Diagnosis: Unsteadiness on feet (R26.81);Pain Pain - Right/Left: Left Pain - part of body: Shoulder         Time: [id number] PT Time Calculation (min) (ACUTE ONLY): 30 min   Charges:   PT Evaluation $PT Eval Moderate Complexity: 1 Procedure PT Treatments $Gait Training: 8-22 mins   PT G Codes:         Scheryl Marten PT, DPT  [phone]  12/26/2016, 4:31 PM

## 2016-12-26 NOTE — Care Management Note (Signed)
Case Management Note  Patient Details  Name: Jose [name]. MRN: [id number] Date of Birth: October 17, 1936  Subjective/Objective:   81 yr old gentleman s/p left total shoulder arthroplasty.                 Action/Plan: Case manager spoke with patient and his wife concerning discharge plan and DME needs. Choice was offered for [place]. Referral was called to Stevie Kern, [place] Liaison. CM has ordered cane for patient. He will have family support at discharge.    Expected Discharge Date:   2/29/18               Expected Discharge Plan:  [place]  In-House Referral:  NA  Discharge planning Services  CM Consult  Post Acute Care Choice:  Home Health, Durable Medical Equipment Choice offered to:  Patient, Spouse  DME Arranged:  Kasandra Knudsen DME Agency:  [place]:  PT [facility] Agency:  [place]  Status of Service:  Completed, signed off  If discussed at [place] of Stay Meetings, dates discussed:    Additional Comments:  Ninfa Meeker, RN 12/26/2016, 3:30 PM

## 2016-12-26 NOTE — Addendum Note (Signed)
Addendum  created 12/26/16 0947 by Roberts Gaudy, MD   Anesthesia Intra Blocks edited, Image imported, Sign clinical note

## 2016-12-27 MED FILL — OXYCODONE-ACETAMINOPHEN 10-: 10-325 | 7 days supply | Qty: 50 | Fill #0

## 2016-12-27 NOTE — Progress Notes (Signed)
Physical Therapy Treatment Patient Details Name: Jose [name]. MRN: [id number] DOB: 30-Oct-1935 Today's Date: 12/27/2016    History of Present Illness 81 yo male s/p L TSA conservative protocol with sling    PT Comments    Pt's mobility adequate for discharge home with family assist.  Pt at slight fall risk as he had one LOB during gait while looking over his right shoulder.  Pt needs min cues for cane sequencing/placement for safety.  Will continue to follow pt for mobility progression while on this venue of care.    Follow Up Recommendations  Home health PT;Supervision for mobility/OOB     Equipment Recommendations  Cane    Recommendations for Other Services       Precautions / Restrictions Precautions Precautions: Shoulder Type of Shoulder Precautions: conservative Shoulder Interventions: Shoulder sling/immobilizer;At all times Precaution Booklet Issued: No Precaution Comments: Reviewed with pt/wife with return demo understanding Restrictions Weight Bearing Restrictions: Yes LUE Weight Bearing: Non weight bearing Other Position/Activity Restrictions: AROM at elbow and wrist only, no PROM shoulder    Mobility  Bed Mobility Overal bed mobility: Needs Assistance Bed Mobility: Supine to Sit     Supine to sit: Supervision     General bed mobility comments: OOB upon arrival  Transfers Overall transfer level: Needs assistance Equipment used: Straight cane Transfers: Sit to/from Stand Sit to Stand: Min guard         General transfer comment: uses momentum technique to perform transfer from bed  Ambulation/Gait Ambulation/Gait assistance: Min guard Ambulation Distance (Feet): 200 Feet Assistive device: Straight cane Gait Pattern/deviations: Step-through pattern;Decreased step length - right;Decreased step length - left;Trunk flexed     General Gait Details: min/mod cues for cane placement and sequencing, initially performed 3-pt gait pattern, progressed  to 2-pt pattern   Stairs            Wheelchair Mobility    Modified Rankin (Stroke Patients Only)       Balance Overall balance assessment: Needs assistance Sitting-balance support: No upper extremity supported;Feet supported Sitting balance-Leahy Scale: Good     Standing balance support: Single extremity supported Standing balance-Leahy Scale: Fair Standing balance comment: 1 LOB when looking over Rt shoulder during gait, needed assist to recover                    Cognition Arousal/Alertness: Awake/alert Behavior During Therapy: WFL for tasks assessed/performed Overall Cognitive Status: Within Functional Limits for tasks assessed                      Exercises General Exercises - Upper Extremity Elbow Flexion: AROM;10 reps;Seated;Left Elbow Extension: AROM;Seated;10 reps;Left Wrist Flexion: AROM;10 reps;Seated;Left Wrist Extension: AROM;10 reps;Left;Seated Digit Composite Flexion: AROM;10 reps;Left Composite Extension: AROM;10 reps;Left Other Exercises Other Exercises: reviewed sling wear and positioning with pt and his wife    General Comments General comments (skin integrity, edema, etc.): reviewed sleeping postures, how to fit cane appropriately with pt/wife      Pertinent Vitals/Pain Pain Assessment: 0-10 Pain Score: 2  Pain Location: L shoulder Pain Descriptors / Indicators: Sore Pain Intervention(s): Limited activity within patient's tolerance;Monitored during session    Home Living Family/patient expects to be discharged to:: Private residence Living Arrangements: Spouse/significant other                  Prior Function            PT Goals (current goals can now be found in the care  plan section) Acute Rehab PT Goals Patient Stated Goal: go home PT Goal Formulation: With patient/family Time For Goal Achievement: 01/02/17 Potential to Achieve Goals: Good Progress towards PT goals: Progressing toward goals     Frequency    Min 5X/week      PT Plan Current plan remains appropriate    Co-evaluation             End of Session Equipment Utilized During Treatment: Gait belt Activity Tolerance: Patient tolerated treatment well Patient left: with call bell/phone within reach;with family/visitor present;in chair Nurse Communication: Mobility status PT Visit Diagnosis: Unsteadiness on feet (R26.81);Pain Pain - Right/Left: Left Pain - part of body: Shoulder     Time: [id number] PT Time Calculation (min) (ACUTE ONLY): 24 min  Charges:  $Gait Training: 8-22 mins $Self Care/Home Management: 07/19/23                    G CodesSuszanne Finch, PT [phone]  Daundre Biel 12/27/2016, 2:14 PM

## 2016-12-27 NOTE — Progress Notes (Signed)
Occupational Therapy Treatment Patient Details Name: Jose [name]. MRN: [id number] DOB: 12/16/1935 Today's Date: 12/27/2016    History of present illness 81 yo male s/p L TSA conservative protocol with sling   OT comments  Pt making progress with functional goals. Pt;s wife present and reviewed all ADL techniques, transfer safety, ROM exercises allowed and sling protocol.   Follow Up Recommendations  No OT follow up    Equipment Recommendations  3 in 1 bedside commode    Recommendations for Other Services      Precautions / Restrictions Precautions Precautions: Shoulder Type of Shoulder Precautions: conservative Shoulder Interventions: Shoulder sling/immobilizer;At all times Precaution Comments:  reviewed by OT with pt and his wife Restrictions Weight Bearing Restrictions: Yes LUE Weight Bearing: Non weight bearing       Mobility Bed Mobility Overal bed mobility: Needs Assistance Bed Mobility: Supine to Sit     Supine to sit: Supervision        Transfers Overall transfer level: Needs assistance Equipment used: Straight cane Transfers: Sit to/from Stand Sit to Stand: Min guard         General transfer comment: Min guard with cues for safety during sit - stand from toilet    Balance Overall balance assessment: Needs assistance Sitting-balance support: No upper extremity supported;Feet supported Sitting balance-Leahy Scale: Good     Standing balance support: Single extremity supported Standing balance-Leahy Scale: Fair                     ADL Overall ADL's : Needs assistance/impaired                         Toilet Transfer: Min guard;Ambulation;Comfort height toilet;Cueing for safety;With caregiver independent assisting       Tub/ Shower Transfer: 3 in 1;Ambulation;Cueing for safety;With caregiver independent assisting   Functional mobility during ADLs: Min guard;Cueing for safety;Caregiver able to provide necessary level of  assistance General ADL Comments: Reviewed ADL techniques with pt and his wife, doffing/donning sling                                      Cognition   Behavior During Therapy: WFL for tasks assessed/performed Overall Cognitive Status: Within Functional Limits for tasks assessed                         Exercises General Exercises - Upper Extremity Elbow Flexion: AROM;10 reps;Seated;Left Elbow Extension: AROM;Seated;10 reps;Left Wrist Flexion: AROM;10 reps;Seated;Left Wrist Extension: AROM;10 reps;Left;Seated Digit Composite Flexion: AROM;10 reps;Left Composite Extension: AROM;10 reps;Left Other Exercises Other Exercises: reviewed sling wear and positioning with pt and his wife          General Comments  pt pleasant and cooperative    Pertinent Vitals/ Pain       Pain Assessment: 0-10 Pain Score: 2  Pain Location: L shoulder Pain Descriptors / Indicators: Sore Pain Intervention(s): Monitored during session;Premedicated before session;Repositioned  Home Living Family/patient expects to be discharged to:: Private residence Living Arrangements: Spouse/significant other                                                    Frequency  Min 2X/week  Progress Toward Goals  OT Goals(current goals can now be found in the care plan section)  Progress towards OT goals: Progressing toward goals     Plan Discharge plan remains appropriate                     End of Session Equipment Utilized During Treatment: Gait belt;Other (comment) (L UE sling)  OT Visit Diagnosis: Unsteadiness on feet (R26.81);Pain Pain - Right/Left: Left Pain - part of body: Shoulder   Activity Tolerance Patient tolerated treatment well   Patient Left in chair;with call bell/phone within reach   Nurse Communication      Functional Assessment Tool Used: AM-PAC 6 Clicks Daily Activity   Time: [id number] OT Time Calculation (min): 34  min  Charges: OT G-codes **NOT FOR INPATIENT CLASS** Functional Assessment Tool Used: AM-PAC 6 Clicks Daily Activity OT General Charges $OT Visit: 1 Procedure OT Treatments $Self Care/Home Management : 8-22 mins $Therapeutic Activity: 8-22 mins     Britt Bottom 12/27/2016, 12:33 PM

## 2017-01-16 MED FILL — AMOXICILLIN 500 MG CAPSULE: 500 | 1 days supply | Qty: 4 | Fill #0

## 2017-02-05 ENCOUNTER — Ambulatory Visit: Payer: Medicare Other | Attending: Orthopedic Surgery | Admitting: Physical Therapy

## 2017-02-05 DIAGNOSIS — R293 Abnormal posture: Secondary | ICD-10-CM | POA: Diagnosis present

## 2017-02-05 DIAGNOSIS — M6281 Muscle weakness (generalized): Secondary | ICD-10-CM | POA: Insufficient documentation

## 2017-02-05 DIAGNOSIS — M25512 Pain in left shoulder: Secondary | ICD-10-CM | POA: Insufficient documentation

## 2017-02-05 DIAGNOSIS — M25612 Stiffness of left shoulder, not elsewhere classified: Secondary | ICD-10-CM | POA: Insufficient documentation

## 2017-02-05 NOTE — Patient Instructions (Signed)
Pendulum Circular    Bend forward 90 at waist, leaning on table for support. Rock body in a circular pattern to move arm clockwise _15___ times then counterclockwise _15___ times. Do __2-3__ sessions per day.   Pendulum Forward/Back    Bend forward 90 at waist, using table for support. Rock body forward and back to swing arm. Repeat __15__ times. Do _2-3___ sessions per day.    Pendulum Side to Side    Bend forward 90 at waist, leaning on table for support. Rock body from side to side and let arm swing freely. Repeat _15___ times. Do __2-3__ sessions per day.

## 2017-02-05 NOTE — Therapy (Signed)
[facility] [address]  [place], [place], [address] Phone: [phone]   Fax:  [phone]  Physical Therapy Evaluation  Patient Details  Name: Jose [name]. MRN: [id number] Date of Birth: 1936-01-23 Referring Provider: Dr. Mardelle Matte  Encounter Date: 02/05/2017      PT End of Session - 02/05/17 1612    Visit Number 1   Number of Visits 24   Date for PT Re-Evaluation 04/02/17   Authorization Type Medicare   PT Start Time 3976   PT Stop Time 1503   PT Time Calculation (min) 59 min   Activity Tolerance Patient tolerated treatment well   Behavior During Therapy [facility] for tasks assessed/performed      Past Medical History:  Diagnosis Date  . Arthritis   . Dysrhythmia    afib  . Gallstones 07/1996  . Headache   . History of CT scan of head 1997   small vessel disease  . History of ETT 05/2002    no EKG changes  . History of MRI of cervical spine 07/15/2002  . Hypertension   . Lumbar spondylolysis 12/17/2016  . MGUS (monoclonal gammopathy of unknown significance) 04/11/2012  . Peripheral neuropathy ([place])   . Pneumonia 10/2014  . PONV (postoperative nausea and vomiting)    once many years ago  . Primary localized osteoarthrosis of left shoulder 12/25/2016  . Rectal bleeding 12/2002   colonoscopy  . Seizures ([place])    last seizure 1976  . Skin cancer   . Sleep apnea    ? to have study after surgery  . Stroke [facility])    ?tia ?afib  . TIA (transient ischemic attack)   . 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 ADENOIDECTOMY    . ANTERIOR CERVICAL DECOMP/DISCECTOMY FUSION N/A 02/04/2015   Procedure: ANTERIOR CERVICAL DECOMPRESSION/DISCECTOMY FUSION CERVICAL FIVE-SIX,CERVICAL SIX-SEVEN;  Surgeon: Karie Chimera, MD;  Location: [facility];  Service: Neurosurgery;  Laterality: N/A;  .  CATARACT EXTRACTION Bilateral   . COLONOSCOPY W/ POLYPECTOMY  04/19/2003   tubular adenoma  . GREAT TOE ARTHRODESIS, INTERPHALANGEAL JOINT Left 1978  . keratosis excision Right 05/1998   wrist  . nerve transfer surgery to lwft hand Left    Nov. 2017  . SHOULDER ARTHROSCOPY Left 13   rotator cuff   . SHOULDER INJECTION  June 2012   left, [place]  . SKIN CANCER EXCISION  07/1993   left lateral arm  . thumb injection  June 2012   left, [place]  . TONSILLECTOMY    . TOTAL SHOULDER ARTHROPLASTY Left 12/25/2016   Procedure: TOTAL SHOULDER ARTHROPLASTY;  Surgeon: Marchia Bond, MD;  Location: [facility];  Service: Orthopedics;  Laterality: Left;  . ulnar nerve transfer  12/1999   left    There were no vitals filed for this visit.       Subjective Assessment - 02/05/17 1407    Subjective Patient s/p L TSA on 12/25/16. Reports R shoulder is just as bad - does plan to also have this replaced. Reports MD allowed patient out sling at home - all the time, must wear sling in public. Prior RTC repair on L shoulder. No pain at rest - some pain/soreness with active movements.    Limitations Lifting   Patient Stated Goals Improve L shoulder function   Currently in Pain?  Yes   Pain Score 0-No pain   Pain Location Shoulder   Pain Orientation Left   Pain Type Surgical pain   Pain Onset More than a month ago   Pain Frequency Intermittent   Aggravating Factors  active movements   Pain Relieving Factors rest            [facility] PT Assessment - 02/05/17 1412      Assessment   Medical Diagnosis L TSA   Referring Provider Dr. Mardelle Matte   Onset Date/Surgical Date 12/25/16   Next MD Visit 03/04/17   Prior Therapy yes     Precautions   Precautions None   Precaution Comments TSA protocol; sling in community     Restrictions   Weight Bearing Restrictions No     Balance Screen   Has the patient fallen in the past 6 months No   Has the patient had a decrease in activity level because of a fear of falling?   No   Is the patient reluctant to leave their home because of a fear of falling?  No     Home Ecologist residence   Living Arrangements Spouse/significant other   Type of [place] - single point     Prior Function   Level of [place] Retired   Leisure travelling     Charity fundraiser Status Within Functional Limits for tasks assessed     Observation/Other Assessments   Focus on Therapeutic Outcomes (FOTO)  Shoulder: 34 (66% limited, predicted 37% limited)     Sensation   Light Touch Appears Intact     Coordination   Gross Motor Movements are Fluid and Coordinated Yes     Posture/Postural Control   Posture/Postural Control Postural limitations   Postural Limitations Rounded Shoulders;Forward head     ROM / Strength   AROM / PROM / Strength AROM;PROM;Strength     AROM   AROM Assessment Site Shoulder   Right/Left Shoulder Right;Left   Right Shoulder Flexion 125 Degrees   Right Shoulder ABduction 63 Degrees   Right Shoulder Internal Rotation --  functional to iliac crest   Right Shoulder External Rotation --  functional to C7   Left Shoulder Flexion 62 Degrees   Left Shoulder ABduction 47 Degrees     PROM   PROM Assessment Site Shoulder   Right/Left Shoulder Left   Left Shoulder Flexion 96 Degrees   Left Shoulder ABduction 59 Degrees   Left Shoulder Internal Rotation 52 Degrees  30 deg abd   Left Shoulder External Rotation 5 Degrees  30 deg abd     Strength   Overall Strength Comments gross strength not tested due to limted ROM of R  R UE, as well as recent surgery of L UE - will assess in the future as indicated                   [facility] Adult PT Treatment/Exercise - 02/05/17 1412      Exercises   Exercises Shoulder     Shoulder Exercises: ROM/Strengthening   Pendulum L UE - CW, CCW, fwd/bwd, lateral     Modalities   Modalities Vasopneumatic      Vasopneumatic   Number Minutes Vasopneumatic  15 minutes   Vasopnuematic Location  Shoulder   Vasopneumatic Pressure Medium   Vasopneumatic Temperature  coldest temp.      Manual Therapy   Manual Therapy  Passive ROM   Manual therapy comments patient supine with bolster under knees   Passive ROM Gentle PROM in all planes: flexion and abduction to tolerance with hold at end range; IR and ER at 30 degrees abduction to tolerance with hold at end range.                 PT Education - 02/05/17 1611    Education provided Yes   Education Details exam findings, POC, HEP   Person(s) Educated Patient   Methods Explanation;Demonstration;Handout   Comprehension Verbalized understanding;Returned demonstration          PT Short Term Goals - 02/05/17 1623      PT SHORT TERM GOAL #1   Title Patient to be independent with intial stretching/strengthening HEP (03/05/17)   Status New     PT SHORT TERM GOAL #2   Title Patient to improve L shoulder PROM: flexion 160, ER 75, and IR 80 - without pain limiting (03/05/17)   Status New     PT SHORT TERM GOAL #3   Title Patient to improve gross L shoulder strength to 3+/5 (03/05/17)   Status New           PT Long Term Goals - 02/05/17 1643      PT LONG TERM GOAL #1   Title Patient to be independent with advanced HEP for L shoulder strengthening (04/02/17)   Status New     PT LONG TERM GOAL #2   Title Patient to improve L shoulder AROM: flexion 160, ER 75, IR 80 wihtout pain limiting (04/02/17)   Status New     PT LONG TERM GOAL #3   Title Patient to demonstrate ability to perform ADLs and basic household chores without pain or functional limatations (04/02/17)   Status New     PT LONG TERM GOAL #4   Title Patient to improve L shoulder strength to >/= 4+/5 (04/02/17)   Status New               Plan - 02/05/17 1616    Clinical Impression Statement Patient is a 81 y/o male presenting to [facility] today for initial eval s/p L TSA on  12/25/16. Patient also demonstrating poor ROM and function of R UE, of which he reports plans for TSA. Patient reporting MD allowance to be out of sling within the home, but must be in sling and use SPC while in the community for general safety. Patient today with reduced PROM and AROM with general weakness and pain limting motion. Slight flexion contracture noted at L elbow likely due to being in sling for 6 weeks. Education today on beginning pendulums as well as gentle stretching to achieve full elbow extension to facilitate improved functional use of L UE. Patient to benefit from PT to address ROM, strength, posture, as well as functional use of L UE.    Rehab Potential Good   PT Frequency 3x / week   PT Duration 8 weeks   PT Treatment/Interventions ADLs/Self Care Home Management;Cryotherapy;Electrical Stimulation;Moist Heat;Ultrasound;Therapeutic exercise;Therapeutic activities;Patient/family education;Manual techniques;Scar mobilization;Passive range of motion;Vasopneumatic Device;Taping;Dry needling;Neuromuscular re-education   PT Next Visit Plan gentle PROM as tolerated; possible incorporation of AAROM with cane   Consulted and Agree with Plan of Care Patient      Patient will benefit from skilled therapeutic intervention in order to improve the following deficits and impairments:  Pain, Impaired UE functional use, Decreased strength, Decreased range of motion, Decreased activity tolerance, Decreased endurance  Visit Diagnosis:  Acute pain of left shoulder - Plan: PT plan of care cert/re-cert  Stiffness of left shoulder, not elsewhere classified - Plan: PT plan of care cert/re-cert  Muscle weakness (generalized) - Plan: PT plan of care cert/re-cert  Abnormal posture - Plan: PT plan of care cert/re-cert      G-Codes - 51/10/21 1655    Functional Assessment Tool Used (Outpatient Only) FOTO: 34 (66% limited)   Functional Limitation Carrying, moving and handling objects   Carrying, Moving  and Handling Objects Current Status (R1735) At least 60 percent but less than 80 percent impaired, limited or restricted   Carrying, Moving and Handling Objects Goal Status (A7014) At least 20 percent but less than 40 percent impaired, limited or restricted       Problem List Patient Active Problem List   Diagnosis Date Noted  . Primary localized osteoarthrosis of left shoulder 12/25/2016  . S/P shoulder replacement 12/25/2016  . Lumbar spondylolysis 12/17/2016  . Paresthesia 01/24/2016  . 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 extremity 03/29/2015  . Cervical spinal stenosis 02/04/2015  . Bronchiectasis without acute exacerbation ([place]) 01/11/2015  . Ulnar neuropathy at elbow 12/24/2014  . TIA (transient ischemic attack) 12/15/2014  . Hyperlipidemia 11/24/2014  . Hypoxia   . New onset a-fib ([place])   . MGUS (monoclonal gammopathy of unknown significance) 04/11/2012  . SEBORRHEIC KERATOSIS 11/09/2010  . CPK, ABNORMAL 09/03/2010  . DERMATOPHYTOSIS OF FOOT 08/03/2010  . MYALGIA 12/27/2009  . GOUT, UNSPECIFIED 10/04/2009  . HYPERTROPHY PROSTATE W/O UR OBST & OTH LUTS 12/14/2008  . ALLERGIC CONJUNCTIVITIS 12/03/2007  . TESTOSTERONE DEFICIENCY 11/14/2007  . Backache 04/22/2007  . HYPERLIPIDEMIA 12/26/2006  . OBESITY, NOS 12/26/2006  . 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 Hereditary and idiopathic peripheral neuropathy 12/26/2006  . HEARING LOSS NOS OR DEAFNESS 12/26/2006  . HYPERTENSION, BENIGN SYSTEMIC 12/26/2006  . OSTEOARTHRITIS, MULTI SITES 12/26/2006  . Musculoskeletal disorder and symptoms referable to neck 12/26/2006    Lanney Gins, PT, DPT 02/05/17 5:00 PM   [facility] [address]  [place], [place], [address] Phone: [phone]   Fax:  [phone]  Name: Jose [name]. MRN: [id number] Date of Birth: [birth year]

## 2017-02-07 ENCOUNTER — Ambulatory Visit: Payer: Medicare Other | Admitting: Physical Therapy

## 2017-02-07 DIAGNOSIS — M25612 Stiffness of left shoulder, not elsewhere classified: Secondary | ICD-10-CM

## 2017-02-07 DIAGNOSIS — M25512 Pain in left shoulder: Secondary | ICD-10-CM | POA: Diagnosis not present

## 2017-02-07 DIAGNOSIS — R293 Abnormal posture: Secondary | ICD-10-CM

## 2017-02-07 DIAGNOSIS — M6281 Muscle weakness (generalized): Secondary | ICD-10-CM

## 2017-02-07 NOTE — Therapy (Signed)
[facility] [address]  [place], [place], [address] Phone: [phone]   Fax:  [phone]  Physical Therapy Treatment  Patient Details  Name: Jose [name]. MRN: [id number] Date of Birth: 1936-09-13 Referring Provider: Dr. Mardelle Matte  Encounter Date: 02/07/2017      PT End of Session - 02/07/17 1446    Visit Number 2   Number of Visits 24   Date for PT Re-Evaluation 04/02/17   Authorization Type Medicare / T[employer] - PT only   PT Start Time 1446   PT Stop Time 1538   PT Time Calculation (min) 52 min   Activity Tolerance Patient tolerated treatment well   Behavior During Therapy [facility] for tasks assessed/performed      Past Medical History:  Diagnosis Date  . Arthritis   . Dysrhythmia    afib  . Gallstones 07/1996  . Headache   . History of CT scan of head 1997   small vessel disease  . History of ETT 05/2002    no EKG changes  . History of MRI of cervical spine 07/15/2002  . Hypertension   . Lumbar spondylolysis 12/17/2016  . MGUS (monoclonal gammopathy of unknown significance) 04/11/2012  . Peripheral neuropathy ([place])   . Pneumonia 10/2014  . PONV (postoperative nausea and vomiting)    once many years ago  . Primary localized osteoarthrosis of left shoulder 12/25/2016  . Rectal bleeding 12/2002   colonoscopy  . Seizures ([place])    last seizure 1976  . Skin cancer   . Sleep apnea    ? to have study after surgery  . Stroke [facility])    ?tia ?afib  . TIA (transient ischemic attack)   . 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 ADENOIDECTOMY    . ANTERIOR CERVICAL DECOMP/DISCECTOMY FUSION N/A 02/04/2015   Procedure: ANTERIOR CERVICAL DECOMPRESSION/DISCECTOMY FUSION CERVICAL FIVE-SIX,CERVICAL SIX-SEVEN;  Surgeon: Karie Chimera, MD;  Location: [place] [facility];  Service: Neurosurgery;   Laterality: N/A;  . CATARACT EXTRACTION Bilateral   . COLONOSCOPY W/ POLYPECTOMY  04/19/2003   tubular adenoma  . GREAT TOE ARTHRODESIS, INTERPHALANGEAL JOINT Left 1978  . keratosis excision Right 05/1998   wrist  . nerve transfer surgery to lwft hand Left    Nov. 2017  . SHOULDER ARTHROSCOPY Left 13   rotator cuff   . SHOULDER INJECTION  June 2012   left, [employer]  . SKIN CANCER EXCISION  07/1993   left lateral arm  . thumb injection  June 2012   left, [place]  . TONSILLECTOMY    . TOTAL SHOULDER ARTHROPLASTY Left 12/25/2016   Procedure: TOTAL SHOULDER ARTHROPLASTY;  Surgeon: Marchia Bond, MD;  Location: [facility];  Service: Orthopedics;  Laterality: Left;  . ulnar nerve transfer  12/1999   left    There were no vitals filed for this visit.      Subjective Assessment - 02/07/17 1450    Patient Stated Goals Improve L shoulder function   Currently in Pain? Yes   Pain Score 1    Pain Location Shoulder   Pain Orientation Left   Pain Descriptors / Indicators Sharp   Pain Type Surgical pain   Pain Onset More than a month ago  [facility] Adult PT Treatment/Exercise - 02/07/17 1446      Shoulder Exercises: Pulleys   Flexion 2 minutes   Flexion Limitations tactile cues to avoid shoulder hike     Shoulder Exercises: ROM/Strengthening   Pendulum L UE - CW, CCW, fwd/bwd, lateral     Shoulder Exercises: Isometric Strengthening   External Rotation 5X5"  2 sets   External Rotation Limitations with towel against wall at doorframe   Internal Rotation 5X5"  2 sets   Internal Rotation Limitations with towel against wall at doorframe   ABduction 5X5"  2 sets   ABduction Limitations with towel against wall     Modalities   Modalities Vasopneumatic     Vasopneumatic   Number Minutes Vasopneumatic  15 minutes   Vasopnuematic Location  Shoulder   Vasopneumatic Pressure Medium   Vasopneumatic Temperature  coldest temp.      Manual Therapy   Manual  Therapy Passive ROM;Soft tissue mobilization   Manual therapy comments patient supine with bolster under knees   Soft tissue mobilization L pecs & subscapularis, teres minor   Passive ROM Gentle PROM in all planes: flexion and abduction to tolerance with hold at end range; IR and ER at 30 degrees abduction to tolerance with hold at end range.                   PT Short Term Goals - 02/07/17 1525      PT SHORT TERM GOAL #1   Title Patient to be independent with intial stretching/strengthening HEP (03/05/17)   Status On-going     PT SHORT TERM GOAL #2   Title Patient to improve L shoulder PROM: flexion 160, ER 75, and IR 80 - without pain limiting (03/05/17)   Status On-going     PT SHORT TERM GOAL #3   Title Patient to improve gross L shoulder strength to 3+/5 (03/05/17)   Status On-going           PT Long Term Goals - 02/07/17 1526      PT LONG TERM GOAL #1   Title Patient to be independent with advanced HEP for L shoulder strengthening (04/02/17)   Status On-going     PT LONG TERM GOAL #2   Title Patient to improve L shoulder AROM: flexion 160, ER 75, IR 80 wihtout pain limiting (04/02/17)   Status On-going     PT LONG TERM GOAL #3   Title Patient to demonstrate ability to perform ADLs and basic household chores without pain or functional limatations (04/02/17)   Status On-going     PT LONG TERM GOAL #4   Title Patient to improve L shoulder strength to >/= 4+/5 (04/02/17)   Status On-going               Plan - 02/07/17 1513    Clinical Impression Statement Pt able to demonstrate pendulum exercises with good technique. Some guarding noted with PROM, but also limited by increased muscle tension in pecs and inferior shoulder musculature therefore interspersed PROM with STM mobilization in these areas. Fatigue noted with introduction is isometrics but no increased pain. Limited tolerance for pulleys on initial attempt.   Rehab Potential Good   PT  Treatment/Interventions ADLs/Self Care Home Management;Cryotherapy;Electrical Stimulation;Moist Heat;Ultrasound;Therapeutic exercise;Therapeutic activities;Patient/family education;Manual techniques;Scar mobilization;Passive range of motion;Vasopneumatic Device;Taping;Dry needling;Neuromuscular re-education   PT Next Visit Plan gentle PROM as tolerated; possible incorporation of AAROM with cane   Consulted and Agree with Plan of Care Patient  Patient will benefit from skilled therapeutic intervention in order to improve the following deficits and impairments:  Pain, Impaired UE functional use, Decreased strength, Decreased range of motion, Decreased activity tolerance, Decreased endurance  Visit Diagnosis: Acute pain of left shoulder  Stiffness of left shoulder, not elsewhere classified  Muscle weakness (generalized)  Abnormal posture     Problem List Patient Active Problem List   Diagnosis Date Noted  . Primary localized osteoarthrosis of left shoulder 12/25/2016  . S/P shoulder replacement 12/25/2016  . Lumbar spondylolysis 12/17/2016  . Paresthesia 01/24/2016  . 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 extremity 03/29/2015  . Cervical spinal stenosis 02/04/2015  . Bronchiectasis without acute exacerbation ([place]) 01/11/2015  . Ulnar neuropathy at elbow 12/24/2014  . TIA (transient ischemic attack) 12/15/2014  . Hyperlipidemia 11/24/2014  . Hypoxia   . New onset a-fib ([place])   . MGUS (monoclonal gammopathy of unknown significance) 04/11/2012  . SEBORRHEIC KERATOSIS 11/09/2010  . CPK, ABNORMAL 09/03/2010  . DERMATOPHYTOSIS OF FOOT 08/03/2010  . MYALGIA 12/27/2009  . GOUT, UNSPECIFIED 10/04/2009  . HYPERTROPHY PROSTATE W/O UR OBST & OTH LUTS 12/14/2008  . ALLERGIC CONJUNCTIVITIS 12/03/2007  . TESTOSTERONE DEFICIENCY 11/14/2007  . Backache 04/22/2007  . HYPERLIPIDEMIA 12/26/2006  . OBESITY, NOS 12/26/2006  . 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 Hereditary and idiopathic  peripheral neuropathy 12/26/2006  . HEARING LOSS NOS OR DEAFNESS 12/26/2006  . HYPERTENSION, BENIGN SYSTEMIC 12/26/2006  . OSTEOARTHRITIS, MULTI SITES 12/26/2006  . Musculoskeletal disorder and symptoms referable to neck 12/26/2006    Percival Spanish, PT, MPT 02/07/2017, 3:33 PM  [facility] [address]  [place], [place], [address] Phone: [phone]   Fax:  [phone]  Name: Jurgen Groeneveld. MRN: [id number] Date of Birth: 1936/01/13

## 2017-02-11 ENCOUNTER — Ambulatory Visit: Payer: Medicare Other | Admitting: Physical Therapy

## 2017-02-11 DIAGNOSIS — M25612 Stiffness of left shoulder, not elsewhere classified: Secondary | ICD-10-CM

## 2017-02-11 DIAGNOSIS — M25512 Pain in left shoulder: Secondary | ICD-10-CM | POA: Diagnosis not present

## 2017-02-11 DIAGNOSIS — M6281 Muscle weakness (generalized): Secondary | ICD-10-CM

## 2017-02-11 DIAGNOSIS — R293 Abnormal posture: Secondary | ICD-10-CM

## 2017-02-11 NOTE — Therapy (Signed)
[facility] [address]  [place], [place], [address] Phone: [phone]   Fax:  [phone]  Physical Therapy Treatment  Patient Details  Name: Jose [name]. MRN: [id number] Date of Birth: Dec 05, 1935 Referring Provider: Dr. Mardelle Matte  Encounter Date: 02/11/2017      PT End of Session - 02/11/17 1315    Visit Number 3   Number of Visits 24   Date for PT Re-Evaluation 04/02/17   Authorization Type Medicare / Tricare - PT only   PT Start Time 1315   PT Stop Time 1418   PT Time Calculation (min) 63 min   Activity Tolerance Patient tolerated treatment well   Behavior During Therapy [facility] for tasks assessed/performed      Past Medical History:  Diagnosis Date  . Arthritis   . Dysrhythmia    afib  . Gallstones 07/1996  . Headache   . History of CT scan of head 1997   small vessel disease  . History of ETT 05/2002    no EKG changes  . History of MRI of cervical spine 07/15/2002  . Hypertension   . Lumbar spondylolysis 12/17/2016  . MGUS (monoclonal gammopathy of unknown significance) 04/11/2012  . Peripheral neuropathy ([place])   . Pneumonia 10/2014  . PONV (postoperative nausea and vomiting)    once many years ago  . Primary localized osteoarthrosis of left shoulder 12/25/2016  . Rectal bleeding 12/2002   colonoscopy  . Seizures ([place])    last seizure 1976  . Skin cancer   . Sleep apnea    ? to have study after surgery  . Stroke [facility])    ?tia ?afib  . TIA (transient ischemic attack)   . 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 ADENOIDECTOMY    . ANTERIOR CERVICAL DECOMP/DISCECTOMY FUSION N/A 02/04/2015   Procedure: ANTERIOR CERVICAL DECOMPRESSION/DISCECTOMY FUSION CERVICAL FIVE-SIX,CERVICAL SIX-SEVEN;  Surgeon: Karie Chimera, MD;  Location: [place] [facility];  Service: Neurosurgery;   Laterality: N/A;  . CATARACT EXTRACTION Bilateral   . COLONOSCOPY W/ POLYPECTOMY  04/19/2003   tubular adenoma  . GREAT TOE ARTHRODESIS, INTERPHALANGEAL JOINT Left 1978  . keratosis excision Right 05/1998   wrist  . nerve transfer surgery to lwft hand Left    Nov. 2017  . SHOULDER ARTHROSCOPY Left 13   rotator cuff   . SHOULDER INJECTION  June 2012   left, [place]  . SKIN CANCER EXCISION  07/1993   left lateral arm  . thumb injection  June 2012   left, [place]  . TONSILLECTOMY    . TOTAL SHOULDER ARTHROPLASTY Left 12/25/2016   Procedure: TOTAL SHOULDER ARTHROPLASTY;  Surgeon: Marchia Bond, MD;  Location: [facility];  Service: Orthopedics;  Laterality: Left;  . ulnar nerve transfer  12/1999   left    There were no vitals filed for this visit.      Subjective Assessment - 02/11/17 1321    Subjective Pt noting increased pain in his shoudler since he has been finding himself spending more time reading on his iPad (holding the iPad with the L arm). Pain can get up to 6-7/10.   Patient Stated Goals Improve L shoulder function   Currently in Pain? Yes   Pain Score 2    Pain Location Shoulder   Pain Orientation Left   Pain Descriptors /  Indicators Aching   Pain Onset More than a month ago                         [facility] Adult PT Treatment/Exercise - 02/11/17 1315      Shoulder Exercises: Supine   External Rotation Left;AAROM;15 reps  3-5" hold at top of motion   External Rotation Limitations wand, elbow supported on pillow at ~30 dg ABD   Flexion Left;AAROM;15 reps  3-5" hold at top of motion   Flexion Limitations wand   ABduction Left;AAROM;15 reps  3-5" hold at top of motion   ABduction Limitations wand, scaption     Shoulder Exercises: Pulleys   Flexion 3 minutes   Flexion Limitations standing   ABduction 3 minutes   ABduction Limitations scaption; standing against 6" FR on wall     Modalities   Modalities Vasopneumatic     Vasopneumatic   Number Minutes  Vasopneumatic  15 minutes   Vasopnuematic Location  Shoulder   Vasopneumatic Pressure Medium   Vasopneumatic Temperature  coldest temp.      Manual Therapy   Manual Therapy Passive ROM;Soft tissue mobilization   Manual therapy comments patient supine with bolster under knees   Soft tissue mobilization L pecs & subscapularis, teres minor   Passive ROM Gentle PROM in all planes: flexion and abduction to tolerance with hold at end range; IR and ER at 30 degrees abduction to tolerance with hold at end range.                 PT Education - 02/11/17 1400    Education provided Yes   Education Details wand AAROM exercises   Person(s) Educated Patient   Methods Explanation;Demonstration;Handout   Comprehension Verbalized understanding;Returned demonstration;Need further instruction          PT Short Term Goals - 02/07/17 1525      PT SHORT TERM GOAL #1   Title Patient to be independent with intial stretching/strengthening HEP (03/05/17)   Status On-going     PT SHORT TERM GOAL #2   Title Patient to improve L shoulder PROM: flexion 160, ER 75, and IR 80 - without pain limiting (03/05/17)   Status On-going     PT SHORT TERM GOAL #3   Title Patient to improve gross L shoulder strength to 3+/5 (03/05/17)   Status On-going           PT Long Term Goals - 02/07/17 1526      PT LONG TERM GOAL #1   Title Patient to be independent with advanced HEP for L shoulder strengthening (04/02/17)   Status On-going     PT LONG TERM GOAL #2   Title Patient to improve L shoulder AROM: flexion 160, ER 75, IR 80 wihtout pain limiting (04/02/17)   Status On-going     PT LONG TERM GOAL #3   Title Patient to demonstrate ability to perform ADLs and basic household chores without pain or functional limatations (04/02/17)   Status On-going     PT LONG TERM GOAL #4   Title Patient to improve L shoulder strength to >/= 4+/5 (04/02/17)   Status On-going               Plan - 02/11/17 1328     Clinical Impression Statement Pt able to better tolerate pulleys today but performed in standing due to limitations with R shoulder OA. Introduced supine AAROM wand exercises with pt able to perform each  correctly, therefore added to HEP. Pt continues to demonstrate muscle guarding with PROM attempts but slowly able to increase ROM.   Rehab Potential Good   PT Treatment/Interventions ADLs/Self Care Home Management;Cryotherapy;Electrical Stimulation;Moist Heat;Ultrasound;Therapeutic exercise;Therapeutic activities;Patient/family education;Manual techniques;Scar mobilization;Passive range of motion;Vasopneumatic Device;Taping;Dry needling;Neuromuscular re-education   PT Next Visit Plan gentle PROM as tolerated; review wand & isometric exercises; continue vasopneumatic compression as indicated   Consulted and Agree with Plan of Care Patient      Patient will benefit from skilled therapeutic intervention in order to improve the following deficits and impairments:  Pain, Impaired UE functional use, Decreased strength, Decreased range of motion, Decreased activity tolerance, Decreased endurance  Visit Diagnosis: Acute pain of left shoulder  Stiffness of left shoulder, not elsewhere classified  Muscle weakness (generalized)  Abnormal posture     Problem List Patient Active Problem List   Diagnosis Date Noted  . Primary localized osteoarthrosis of left shoulder 12/25/2016  . S/P shoulder replacement 12/25/2016  . Lumbar spondylolysis 12/17/2016  . Paresthesia 01/24/2016  . 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 extremity 03/29/2015  . Cervical spinal stenosis 02/04/2015  . Bronchiectasis without acute exacerbation ([place]) 01/11/2015  . Ulnar neuropathy at elbow 12/24/2014  . TIA (transient ischemic attack) 12/15/2014  . Hyperlipidemia 11/24/2014  . Hypoxia   . New onset a-fib ([place])   . MGUS (monoclonal gammopathy of unknown significance) 04/11/2012  . SEBORRHEIC KERATOSIS 11/09/2010  .  CPK, ABNORMAL 09/03/2010  . DERMATOPHYTOSIS OF FOOT 08/03/2010  . MYALGIA 12/27/2009  . GOUT, UNSPECIFIED 10/04/2009  . HYPERTROPHY PROSTATE W/O UR OBST & OTH LUTS 12/14/2008  . ALLERGIC CONJUNCTIVITIS 12/03/2007  . TESTOSTERONE DEFICIENCY 11/14/2007  . Backache 04/22/2007  . HYPERLIPIDEMIA 12/26/2006  . OBESITY, NOS 12/26/2006  . 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 Hereditary and idiopathic peripheral neuropathy 12/26/2006  . HEARING LOSS NOS OR DEAFNESS 12/26/2006  . HYPERTENSION, BENIGN SYSTEMIC 12/26/2006  . OSTEOARTHRITIS, MULTI SITES 12/26/2006  . Musculoskeletal disorder and symptoms referable to neck 12/26/2006    Percival Spanish, PT, MPT 02/11/2017, 5:03 PM  [facility] [address]  [address] [place], [place], [address] Phone: [phone]   Fax:  [phone]  Name: Lathan Gieselman. MRN: [id number] Date of Birth: 10-01-1936

## 2017-02-13 ENCOUNTER — Ambulatory Visit: Payer: Medicare Other | Admitting: Physical Therapy

## 2017-02-13 DIAGNOSIS — M25512 Pain in left shoulder: Secondary | ICD-10-CM

## 2017-02-13 DIAGNOSIS — M25612 Stiffness of left shoulder, not elsewhere classified: Secondary | ICD-10-CM

## 2017-02-13 DIAGNOSIS — R293 Abnormal posture: Secondary | ICD-10-CM

## 2017-02-13 DIAGNOSIS — M6281 Muscle weakness (generalized): Secondary | ICD-10-CM

## 2017-02-13 NOTE — Therapy (Signed)
[facility] [address]  [place], [place], [address] Phone: [phone]   Fax:  [phone]  Physical Therapy Treatment  Patient Details  Name: Jose [name]. MRN: [id number] Date of Birth: [birth year] Referring Provider: Dr. Mardelle Matte  Encounter Date: 02/13/2017      PT End of Session - 02/13/17 1047    Visit Number 4   Number of Visits 24   Date for PT Re-Evaluation 04/02/17   Authorization Type Medicare / Tricare - PT only   PT Start Time 0930   PT Stop Time 1033   PT Time Calculation (min) 63 min   Activity Tolerance Patient tolerated treatment well   Behavior During Therapy [facility] for tasks assessed/performed      Past Medical History:  Diagnosis Date  . Arthritis   . Dysrhythmia    afib  . Gallstones 07/1996  . Headache   . History of CT scan of head 1997   small vessel disease  . History of ETT 05/2002    no EKG changes  . History of MRI of cervical spine 07/15/2002  . Hypertension   . Lumbar spondylolysis 12/17/2016  . MGUS (monoclonal gammopathy of unknown significance) 04/11/2012  . Peripheral neuropathy ([place])   . Pneumonia 10/2014  . PONV (postoperative nausea and vomiting)    once many years ago  . Primary localized osteoarthrosis of left shoulder 12/25/2016  . Rectal bleeding 12/2002   colonoscopy  . Seizures ([place])    last seizure 1976  . Skin cancer   . Sleep apnea    ? to have study after surgery  . Stroke [facility])    ?tia ?afib  . TIA (transient ischemic attack)   . 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 ADENOIDECTOMY    . ANTERIOR CERVICAL DECOMP/DISCECTOMY FUSION N/A 02/04/2015   Procedure: ANTERIOR CERVICAL DECOMPRESSION/DISCECTOMY FUSION CERVICAL FIVE-SIX,CERVICAL SIX-SEVEN;  Surgeon: Karie Chimera, MD;  Location: [place] [facility];  Service: Neurosurgery;   Laterality: N/A;  . CATARACT EXTRACTION Bilateral   . COLONOSCOPY W/ POLYPECTOMY  04/19/2003   tubular adenoma  . GREAT TOE ARTHRODESIS, INTERPHALANGEAL JOINT Left 1978  . keratosis excision Right 05/1998   wrist  . nerve transfer surgery to lwft hand Left    Nov. 2017  . SHOULDER ARTHROSCOPY Left 13   rotator cuff   . SHOULDER INJECTION  June 2012   left, [place]  . SKIN CANCER EXCISION  07/1993   left lateral arm  . thumb injection  June 2012   left, [place]  . TONSILLECTOMY    . TOTAL SHOULDER ARTHROPLASTY Left 12/25/2016   Procedure: TOTAL SHOULDER ARTHROPLASTY;  Surgeon: Marchia Bond, MD;  Location: [facility];  Service: Orthopedics;  Laterality: Left;  . ulnar nerve transfer  12/1999   left    There were no vitals filed for this visit.      Subjective Assessment - 02/13/17 1044    Subjective feels like R shoulder has more pain with wadn exercises than left   Patient Stated Goals Improve L shoulder function   Currently in Pain? Yes   Pain Score 2    Pain Location Shoulder   Pain Orientation Left   Pain Descriptors / Indicators Aching   Pain Type Surgical pain            [facility] PT  Assessment - 02/13/17 0001      PROM   Left Shoulder Flexion 128 Degrees   Left Shoulder ABduction 98 Degrees   Left Shoulder Internal Rotation 62 Degrees  scaption   Left Shoulder External Rotation 17 Degrees  scaption                     [facility] Adult PT Treatment/Exercise - 02/13/17 0001      Shoulder Exercises: Pulleys   Flexion 2 minutes   ABduction 2 minutes   ABduction Limitations scaption     Shoulder Exercises: Isometric Strengthening   External Rotation Limitations 10 x 5" - towel at doorframe   Internal Rotation Limitations 10 x 5" - towel at doorframe   ABduction Limitations 10 x 5" - towel against wall     Shoulder Exercises: Stretch   Table Stretch - Flexion 5 reps;20 seconds     Modalities   Modalities Vasopneumatic     Vasopneumatic   Number  Minutes Vasopneumatic  15 minutes   Vasopnuematic Location  Shoulder   Vasopneumatic Pressure Medium   Vasopneumatic Temperature  coldest temp.      Manual Therapy   Manual Therapy Passive ROM;Soft tissue mobilization   Manual therapy comments patient supine with bolster under knees   Soft tissue mobilization L pec mm group   Passive ROM Gentle PROM in all planes: flexion and abduction to tolerance with hold at end range; IR and ER at 30 degrees abduction to tolerance with hold at end range.                 PT Education - 02/13/17 1046    Education provided Yes   Education Details iosmetric IR/ER/abduction   [employer]) Educated Patient   Methods Explanation;Demonstration;Handout   Comprehension Verbalized understanding;Returned demonstration          PT Short Term Goals - 02/07/17 1525      PT SHORT TERM GOAL #1   Title Patient to be independent with intial stretching/strengthening HEP (03/05/17)   Status On-going     PT SHORT TERM GOAL #2   Title Patient to improve L shoulder PROM: flexion 160, ER 75, and IR 80 - without pain limiting (03/05/17)   Status On-going     PT SHORT TERM GOAL #3   Title Patient to improve gross L shoulder strength to 3+/5 (03/05/17)   Status On-going           PT Long Term Goals - 02/07/17 1526      PT LONG TERM GOAL #1   Title Patient to be independent with advanced HEP for L shoulder strengthening (04/02/17)   Status On-going     PT LONG TERM GOAL #2   Title Patient to improve L shoulder AROM: flexion 160, ER 75, IR 80 wihtout pain limiting (04/02/17)   Status On-going     PT LONG TERM GOAL #3   Title Patient to demonstrate ability to perform ADLs and basic household chores without pain or functional limatations (04/02/17)   Status On-going     PT LONG TERM GOAL #4   Title Patient to improve L shoulder strength to >/= 4+/5 (04/02/17)   Status On-going               Plan - 02/13/17 1048    Clinical Impression Statement [name] doing well today - tolerating all passive stretching and progressions of protocol. PROM measured today with good progression since starting PT. Patient given IR,  ER, and abduction isometric handout today with good carryover; as well as counter walkouts for passive flexion ROM as he states the wand activities often flare up R shoulder pain. Will continue to benefit from PT to progress functional use of L UE.    PT Treatment/Interventions ADLs/Self Care Home Management;Cryotherapy;Electrical Stimulation;Moist Heat;Ultrasound;Therapeutic exercise;Therapeutic activities;Patient/family education;Manual techniques;Scar mobilization;Passive range of motion;Vasopneumatic Device;Taping;Dry needling;Neuromuscular re-education   PT Next Visit Plan gentle PROM as tolerated; review wand & isometric exercises; continue vasopneumatic compression as indicated   Consulted and Agree with Plan of Care Patient      Patient will benefit from skilled therapeutic intervention in order to improve the following deficits and impairments:  Pain, Impaired UE functional use, Decreased strength, Decreased range of motion, Decreased activity tolerance, Decreased endurance  Visit Diagnosis: Acute pain of left shoulder  Stiffness of left shoulder, not elsewhere classified  Muscle weakness (generalized)  Abnormal posture     Problem List Patient Active Problem List   Diagnosis Date Noted  . Primary localized osteoarthrosis of left shoulder 12/25/2016  . S/P shoulder replacement 12/25/2016  . Lumbar spondylolysis 12/17/2016  . Paresthesia 01/24/2016  . 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 extremity 03/29/2015  . Cervical spinal stenosis 02/04/2015  . Bronchiectasis without acute exacerbation ([place]) 01/11/2015  . Ulnar neuropathy at elbow 12/24/2014  . TIA (transient ischemic attack) 12/15/2014  . Hyperlipidemia 11/24/2014  . Hypoxia   . New onset a-fib ([place])   . MGUS (monoclonal gammopathy of unknown  significance) 04/11/2012  . SEBORRHEIC KERATOSIS 11/09/2010  . CPK, ABNORMAL 09/03/2010  . DERMATOPHYTOSIS OF FOOT 08/03/2010  . MYALGIA 12/27/2009  . GOUT, UNSPECIFIED 10/04/2009  . HYPERTROPHY PROSTATE W/O UR OBST & OTH LUTS 12/14/2008  . ALLERGIC CONJUNCTIVITIS 12/03/2007  . TESTOSTERONE DEFICIENCY 11/14/2007  . Backache 04/22/2007  . HYPERLIPIDEMIA 12/26/2006  . OBESITY, NOS 12/26/2006  . 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 Hereditary and idiopathic peripheral neuropathy 12/26/2006  . HEARING LOSS NOS OR DEAFNESS 12/26/2006  . HYPERTENSION, BENIGN SYSTEMIC 12/26/2006  . OSTEOARTHRITIS, MULTI SITES 12/26/2006  . Musculoskeletal disorder and symptoms referable to neck 12/26/2006    Lanney Gins, PT, DPT 02/13/17 11:19 AM   [facility] [address]  [place], [place], [address] Phone: [phone]   Fax:  [phone]  Name: Jose [name]. MRN: [id number] Date of Birth: 05/24/1936

## 2017-02-13 NOTE — Patient Instructions (Signed)
SHOULDER: Abduction (Isometric)    Use wall as resistance. Press arm against pillow. Keep elbow straight. Hold _5-10__ seconds. _10-15__ reps per set, __2_ sets per day   Internal Rotation (Isometric)    Place palm of right fist against door frame, with elbow bent. Press fist against door frame. Hold __5-10__ seconds. Repeat _10-15___ times.     External Rotation (Isometric)    Place back of left fist against door frame, with elbow bent. Press fist against door frame. Hold _5-10___ seconds. Repeat _10-15___ times. Do _2___ sessions per day.     Flexion (Passive)    Sitting upright, slide forearm forward along table, bending from the waist until a stretch is felt. Hold _10-15___ seconds. Repeat __10-15__ times. Do _2___ sessions per day.

## 2017-02-15 ENCOUNTER — Ambulatory Visit: Payer: Medicare Other | Admitting: Physical Therapy

## 2017-02-15 DIAGNOSIS — M25612 Stiffness of left shoulder, not elsewhere classified: Secondary | ICD-10-CM

## 2017-02-15 DIAGNOSIS — M6281 Muscle weakness (generalized): Secondary | ICD-10-CM

## 2017-02-15 DIAGNOSIS — M25512 Pain in left shoulder: Secondary | ICD-10-CM

## 2017-02-15 DIAGNOSIS — R293 Abnormal posture: Secondary | ICD-10-CM

## 2017-02-15 NOTE — Therapy (Signed)
[facility] [address]  [place], [place], [address] Phone: [phone]   Fax:  [phone]  Physical Therapy Treatment  Patient Details  Name: Jose [name]. MRN: [id number] Date of Birth: May 07, 1936 Referring Provider: Dr. Mardelle Matte  Encounter Date: 02/15/2017      PT End of Session - 02/15/17 1229    Visit Number 5   Number of Visits 24   Date for PT Re-Evaluation 04/02/17   Authorization Type Medicare / Tricare - PT only   PT Start Time 1101   PT Stop Time 1210   PT Time Calculation (min) 69 min   Activity Tolerance Patient tolerated treatment well   Behavior During Therapy [facility] for tasks assessed/performed      Past Medical History:  Diagnosis Date  . Arthritis   . Dysrhythmia    afib  . Gallstones 07/1996  . Headache   . History of CT scan of head 1997   small vessel disease  . History of ETT 05/2002    no EKG changes  . History of MRI of cervical spine 07/15/2002  . Hypertension   . Lumbar spondylolysis 12/17/2016  . MGUS (monoclonal gammopathy of unknown significance) 04/11/2012  . Peripheral neuropathy ([place])   . Pneumonia 10/2014  . PONV (postoperative nausea and vomiting)    once many years ago  . Primary localized osteoarthrosis of left shoulder 12/25/2016  . Rectal bleeding 12/2002   colonoscopy  . Seizures ([place])    last seizure 1976  . Skin cancer   . Sleep apnea    ? to have study after surgery  . Stroke [facility])    ?tia ?afib  . TIA (transient ischemic attack)   . 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 ADENOIDECTOMY    . ANTERIOR CERVICAL DECOMP/DISCECTOMY FUSION N/A 02/04/2015   Procedure: ANTERIOR CERVICAL DECOMPRESSION/DISCECTOMY FUSION CERVICAL FIVE-SIX,CERVICAL SIX-SEVEN;  Surgeon: Karie Chimera, MD;  Location: [place] [facility];  Service: Neurosurgery;   Laterality: N/A;  . CATARACT EXTRACTION Bilateral   . COLONOSCOPY W/ POLYPECTOMY  04/19/2003   tubular adenoma  . GREAT TOE ARTHRODESIS, INTERPHALANGEAL JOINT Left 1978  . keratosis excision Right 05/1998   wrist  . nerve transfer surgery to lwft hand Left    Nov. 2017  . SHOULDER ARTHROSCOPY Left 13   rotator cuff   . SHOULDER INJECTION  June 2012   left, [place]  . SKIN CANCER EXCISION  07/1993   left lateral arm  . thumb injection  June 2012   left, [place]  . TONSILLECTOMY    . TOTAL SHOULDER ARTHROPLASTY Left 12/25/2016   Procedure: TOTAL SHOULDER ARTHROPLASTY;  Surgeon: Marchia Bond, MD;  Location: [place];  Service: Orthopedics;  Laterality: Left;  . ulnar nerve transfer  12/1999   left    There were no vitals filed for this visit.      Subjective Assessment - 02/15/17 1227    Subjective frustrated that both shoulders hurt - has been having to do a lot around the house due to recent move.    Patient Stated Goals Improve L shoulder function   Currently in Pain? Yes   Pain Score 3    Pain Orientation Left   Pain Descriptors / Indicators Aching   Pain Type Surgical pain  Jose [name] Adult PT Treatment/Exercise - 02/15/17 0001      Shoulder Exercises: Pulleys   Flexion 2 minutes   ABduction 2 minutes   ABduction Limitations scaption     Modalities   Modalities Electrical Stimulation;Vasopneumatic     Electrical Stimulation   Electrical Stimulation Location L shoulder   Electrical Stimulation Action IFC    Electrical Stimulation Parameters to tolerance   Electrical Stimulation Goals Tone;Pain     Vasopneumatic   Number Minutes Vasopneumatic  15 minutes   Vasopnuematic Location  Shoulder  bilateral   Vasopneumatic Pressure Medium   Vasopneumatic Temperature  coldest temp.      Manual Therapy   Manual Therapy Soft tissue mobilization;Passive ROM;Myofascial release   Manual therapy comments patient supine with bolster under  knees   Soft tissue mobilization STM to L pec region, L lateral shoulder, L anterior and posterior capsule   Myofascial Release manual trigger point release to L lat, L teres, L infraspinatus   Passive ROM Gentle PROM in all planes: flexion and abduction to tolerance with hold at end range; IR and ER at 30 degrees abduction to tolerance with hold at end range.                   PT Short Term Goals - 02/07/17 1525      PT SHORT TERM GOAL #1   Title Patient to be independent with intial stretching/strengthening HEP (03/05/17)   Status On-going     PT SHORT TERM GOAL #2   Title Patient to improve L shoulder PROM: flexion 160, ER 75, and IR 80 - without pain limiting (03/05/17)   Status On-going     PT SHORT TERM GOAL #3   Title Patient to improve gross L shoulder strength to 3+/5 (03/05/17)   Status On-going           PT Long Term Goals - 02/07/17 1526      PT LONG TERM GOAL #1   Title Patient to be independent with advanced HEP for L shoulder strengthening (04/02/17)   Status On-going     PT LONG TERM GOAL #2   Title Patient to improve L shoulder AROM: flexion 160, ER 75, IR 80 wihtout pain limiting (04/02/17)   Status On-going     PT LONG TERM GOAL #3   Title Patient to demonstrate ability to perform ADLs and basic household chores without pain or functional limatations (04/02/17)   Status On-going     PT LONG TERM GOAL #4   Title Patient to improve L shoulder strength to >/= 4+/5 (04/02/17)   Status On-going               Plan - 02/15/17 1230    Clinical Impression Statement Patient expressing frustrations today on pain at both shoulders with HEP as well as household activities continuing to increase pain and take time to go through daily. PT expressing that motion (stretching) as well as beginning to do some light strength training (isometrics) is the most improtant at this phase in his rehab process. Much time spent today on STM and manual trigger point release due  to pain and general tightness. Estim and Vaso to end session for hopeful lasting pain relief.    PT Treatment/Interventions ADLs/Self Care Home Management;Cryotherapy;Electrical Stimulation;Moist Heat;Ultrasound;Therapeutic exercise;Therapeutic activities;Patient/family education;Manual techniques;Scar mobilization;Passive range of motion;Vasopneumatic Device;Taping;Dry needling;Neuromuscular re-education   PT Next Visit Plan gentle PROM as tolerated; review wand & isometric exercises; continue vasopneumatic compression as indicated; estim as indicated  for pain   Consulted and Agree with Plan of Care Patient      Patient will benefit from skilled therapeutic intervention in order to improve the following deficits and impairments:  Pain, Impaired UE functional use, Decreased strength, Decreased range of motion, Decreased activity tolerance, Decreased endurance  Visit Diagnosis: Acute pain of left shoulder  Stiffness of left shoulder, not elsewhere classified  Muscle weakness (generalized)  Abnormal posture     Problem List Patient Active Problem List   Diagnosis Date Noted  . Primary localized osteoarthrosis of left shoulder 12/25/2016  . S/P shoulder replacement 12/25/2016  . Lumbar spondylolysis 12/17/2016  . Paresthesia 01/24/2016  . 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 extremity 03/29/2015  . Cervical spinal stenosis 02/04/2015  . Bronchiectasis without acute exacerbation ([place]) 01/11/2015  . Ulnar neuropathy at elbow 12/24/2014  . TIA (transient ischemic attack) 12/15/2014  . Hyperlipidemia 11/24/2014  . Hypoxia   . New onset a-fib ([place])   . MGUS (monoclonal gammopathy of unknown significance) 04/11/2012  . SEBORRHEIC KERATOSIS 11/09/2010  . CPK, ABNORMAL 09/03/2010  . DERMATOPHYTOSIS OF FOOT 08/03/2010  . MYALGIA 12/27/2009  . GOUT, UNSPECIFIED 10/04/2009  . HYPERTROPHY PROSTATE W/O UR OBST & OTH LUTS 12/14/2008  . ALLERGIC CONJUNCTIVITIS 12/03/2007  . TESTOSTERONE  DEFICIENCY 11/14/2007  . Backache 04/22/2007  . HYPERLIPIDEMIA 12/26/2006  . OBESITY, NOS 12/26/2006  . 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 Hereditary and idiopathic peripheral neuropathy 12/26/2006  . HEARING LOSS NOS OR DEAFNESS 12/26/2006  . HYPERTENSION, BENIGN SYSTEMIC 12/26/2006  . OSTEOARTHRITIS, MULTI SITES 12/26/2006  . Musculoskeletal disorder and symptoms referable to neck 12/26/2006     Lanney Gins, PT, DPT 02/15/17 12:34 PM   [facility] [address]  [place], [place], [address] Phone: [phone]   Fax:  [phone]  Name: Jose [name]. MRN: [id number] Date of Birth: 01-26-1936

## 2017-02-18 ENCOUNTER — Ambulatory Visit: Payer: Medicare Other | Admitting: Physical Therapy

## 2017-02-18 DIAGNOSIS — M25512 Pain in left shoulder: Secondary | ICD-10-CM | POA: Diagnosis not present

## 2017-02-18 DIAGNOSIS — R293 Abnormal posture: Secondary | ICD-10-CM

## 2017-02-18 DIAGNOSIS — M25612 Stiffness of left shoulder, not elsewhere classified: Secondary | ICD-10-CM

## 2017-02-18 DIAGNOSIS — M6281 Muscle weakness (generalized): Secondary | ICD-10-CM

## 2017-02-18 NOTE — Therapy (Signed)
[facility] [address]  [place], [place], [address] Phone: [phone]   Fax:  [phone]  Physical Therapy Treatment  Patient Details  Name: Jose [name]. MRN: [id number] Date of Birth: [birth year] Referring Provider: Dr. Mardelle Matte  Encounter Date: 02/18/2017      PT End of Session - 02/18/17 1407    Visit Number 6   Number of Visits 24   Date for PT Re-Evaluation 04/02/17   Authorization Type Medicare / Tricare - PT only   PT Start Time 1101   PT Stop Time 1202   PT Time Calculation (min) 61 min   Activity Tolerance Patient tolerated treatment well   Behavior During Therapy [facility] for tasks assessed/performed      Past Medical History:  Diagnosis Date  . Arthritis   . Dysrhythmia    afib  . Gallstones 07/1996  . Headache   . History of CT scan of head 1997   small vessel disease  . History of ETT 05/2002    no EKG changes  . History of MRI of cervical spine 07/15/2002  . Hypertension   . Lumbar spondylolysis 12/17/2016  . MGUS (monoclonal gammopathy of unknown significance) 04/11/2012  . Peripheral neuropathy ([place])   . Pneumonia 10/2014  . PONV (postoperative nausea and vomiting)    once many years ago  . Primary localized osteoarthrosis of left shoulder 12/25/2016  . Rectal bleeding 12/2002   colonoscopy  . Seizures ([place])    last seizure 1976  . Skin cancer   . Sleep apnea    ? to have study after surgery  . Stroke [facility])    ?tia ?afib  . TIA (transient ischemic attack)   . 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 ADENOIDECTOMY    . ANTERIOR CERVICAL DECOMP/DISCECTOMY FUSION N/A 02/04/2015   Procedure: ANTERIOR CERVICAL DECOMPRESSION/DISCECTOMY FUSION CERVICAL FIVE-SIX,CERVICAL SIX-SEVEN;  Surgeon: Karie Chimera, MD;  Location: [place] [facility];  Service: Neurosurgery;   Laterality: N/A;  . CATARACT EXTRACTION Bilateral   . COLONOSCOPY W/ POLYPECTOMY  04/19/2003   tubular adenoma  . GREAT TOE ARTHRODESIS, INTERPHALANGEAL JOINT Left 1978  . keratosis excision Right 05/1998   wrist  . nerve transfer surgery to lwft hand Left    Nov. 2017  . SHOULDER ARTHROSCOPY Left 13   rotator cuff   . SHOULDER INJECTION  June 2012   left, [place]  . SKIN CANCER EXCISION  07/1993   left lateral arm  . thumb injection  June 2012   left, [place]  . TONSILLECTOMY    . TOTAL SHOULDER ARTHROPLASTY Left 12/25/2016   Procedure: TOTAL SHOULDER ARTHROPLASTY;  Surgeon: Marchia Bond, MD;  Location: [place];  Service: Orthopedics;  Laterality: Left;  . ulnar nerve transfer  12/1999   left    There were no vitals filed for this visit.      Subjective Assessment - 02/18/17 1406    Subjective "I was able to dress myself for the first time after Fridays session" - in much better spirits today. Feels like L shoulder is better than R at this point    Patient Stated Goals Improve L shoulder function   Currently in Pain? Yes   Pain Score 2    Pain Location Shoulder   Pain Orientation Left   Pain Descriptors / Indicators Aching  Pain Type Surgical pain                         [facility] Adult PT Treatment/Exercise - 02/18/17 0001      Shoulder Exercises: Standing   Other Standing Exercises bicep curls - 2# L UE x 15 reps; tricep extensions - 2# L UE x 15 reps (bent forward at waist)     Shoulder Exercises: Pulleys   Flexion 3 minutes   ABduction 3 minutes   ABduction Limitations scaption     Shoulder Exercises: Isometric Strengthening   External Rotation Limitations 10 x 5" - towel at doorframe   Internal Rotation Limitations 10 x 5" - towel at doorframe   ABduction Limitations 10 x 5" - towel against wall     Modalities   Modalities Electrical Stimulation;Vasopneumatic     Electrical Stimulation   Electrical Stimulation Location L shoulder   Electrical  Stimulation Action IFC   Electrical Stimulation Parameters to toleance   Electrical Stimulation Goals Pain;Tone     Vasopneumatic   Number Minutes Vasopneumatic  15 minutes   Vasopnuematic Location  Shoulder  bilateral   Vasopneumatic Pressure Medium   Vasopneumatic Temperature  coldest temp.      Manual Therapy   Manual Therapy Soft tissue mobilization;Myofascial release;Passive ROM   Manual therapy comments patient supine with bolster under knees   Soft tissue mobilization STM to L pec region, L lateral shoulder, L anterior and posterior capsule   Myofascial Release manual trigger point release to poster shoulder complex (teres, infra)   Passive ROM Gentle PROM in all planes: flexion and abduction to tolerance with hold at end range; IR and ER at 30 degrees abduction to tolerance with hold at end range.                   PT Short Term Goals - 02/07/17 1525      PT SHORT TERM GOAL #1   Title Patient to be independent with intial stretching/strengthening HEP (03/05/17)   Status On-going     PT SHORT TERM GOAL #2   Title Patient to improve L shoulder PROM: flexion 160, ER 75, and IR 80 - without pain limiting (03/05/17)   Status On-going     PT SHORT TERM GOAL #3   Title Patient to improve gross L shoulder strength to 3+/5 (03/05/17)   Status On-going           PT Long Term Goals - 02/07/17 1526      PT LONG TERM GOAL #1   Title Patient to be independent with advanced HEP for L shoulder strengthening (04/02/17)   Status On-going     PT LONG TERM GOAL #2   Title Patient to improve L shoulder AROM: flexion 160, ER 75, IR 80 wihtout pain limiting (04/02/17)   Status On-going     PT LONG TERM GOAL #3   Title Patient to demonstrate ability to perform ADLs and basic household chores without pain or functional limatations (04/02/17)   Status On-going     PT LONG TERM GOAL #4   Title Patient to improve L shoulder strength to >/= 4+/5 (04/02/17)   Status On-going                Plan - 02/18/17 1407    Clinical Impression Statement Jose [name] is in much better spirits today with reports of ability to dress himself independently for the first time since surgery. Patient doing  well with all STM and PROM today with pain continued at end ranges, however making excellent progress with PROM in just a short period of time. Initiation of bicep/tricep work today with good tolerance - some pain when initially starting motion, however, quickly resolves. Will continue to progress per protocol.    PT Treatment/Interventions ADLs/Self Care Home Management;Cryotherapy;Electrical Stimulation;Moist Heat;Ultrasound;Therapeutic exercise;Therapeutic activities;Patient/family education;Manual techniques;Scar mobilization;Passive range of motion;Vasopneumatic Device;Taping;Dry needling;Neuromuscular re-education   PT Next Visit Plan gentle PROM as tolerated; review wand & isometric exercises; continue vasopneumatic compression as indicated; estim as indicated for pain, progress per protocol.    Consulted and Agree with Plan of Care Patient      Patient will benefit from skilled therapeutic intervention in order to improve the following deficits and impairments:  Pain, Impaired UE functional use, Decreased strength, Decreased range of motion, Decreased activity tolerance, Decreased endurance  Visit Diagnosis: Acute pain of left shoulder  Stiffness of left shoulder, not elsewhere classified  Muscle weakness (generalized)  Abnormal posture     Problem List Patient Active Problem List   Diagnosis Date Noted  . Primary localized osteoarthrosis of left shoulder 12/25/2016  . S/P shoulder replacement 12/25/2016  . Lumbar spondylolysis 12/17/2016  . Paresthesia 01/24/2016  . 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 extremity 03/29/2015  . Cervical spinal stenosis 02/04/2015  . Bronchiectasis without acute exacerbation ([place]) 01/11/2015  . Ulnar neuropathy at elbow  12/24/2014  . TIA (transient ischemic attack) 12/15/2014  . Hyperlipidemia 11/24/2014  . Hypoxia   . New onset a-fib ([place])   . MGUS (monoclonal gammopathy of unknown significance) 04/11/2012  . SEBORRHEIC KERATOSIS 11/09/2010  . CPK, ABNORMAL 09/03/2010  . DERMATOPHYTOSIS OF FOOT 08/03/2010  . MYALGIA 12/27/2009  . GOUT, UNSPECIFIED 10/04/2009  . HYPERTROPHY PROSTATE W/O UR OBST & OTH LUTS 12/14/2008  . ALLERGIC CONJUNCTIVITIS 12/03/2007  . TESTOSTERONE DEFICIENCY 11/14/2007  . Backache 04/22/2007  . HYPERLIPIDEMIA 12/26/2006  . OBESITY, NOS 12/26/2006  . 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 Hereditary and idiopathic peripheral neuropathy 12/26/2006  . HEARING LOSS NOS OR DEAFNESS 12/26/2006  . HYPERTENSION, BENIGN SYSTEMIC 12/26/2006  . OSTEOARTHRITIS, MULTI SITES 12/26/2006  . Musculoskeletal disorder and symptoms referable to neck 12/26/2006     Lanney Gins, PT, DPT 02/18/17 3:11 PM   [facility] [address]  [place], [place], [address] Phone: [phone]   Fax:  [phone]  Name: Jose [name]. MRN: [id number] Date of Birth: [birth year]

## 2017-02-20 ENCOUNTER — Ambulatory Visit: Payer: Medicare Other | Admitting: Physical Therapy

## 2017-02-20 DIAGNOSIS — M25512 Pain in left shoulder: Secondary | ICD-10-CM

## 2017-02-20 DIAGNOSIS — R293 Abnormal posture: Secondary | ICD-10-CM

## 2017-02-20 DIAGNOSIS — M25612 Stiffness of left shoulder, not elsewhere classified: Secondary | ICD-10-CM

## 2017-02-20 DIAGNOSIS — M6281 Muscle weakness (generalized): Secondary | ICD-10-CM

## 2017-02-20 NOTE — Therapy (Signed)
[facility] [address]  [place], [place], [address] Phone: [phone]   Fax:  [phone]  Physical Therapy Treatment  Patient Details  Name: Jose Que. MRN: [id number] Date of Birth: 12-01-1935 Referring Provider: Dr. Mardelle Matte  Encounter Date: 02/20/2017      PT End of Session - 02/20/17 1106    Visit Number 7   Number of Visits 24   Date for PT Re-Evaluation 04/02/17   Authorization Type Medicare / Tricare - PT only   PT Start Time 1100   PT Stop Time 1204   PT Time Calculation (min) 64 min   Activity Tolerance Patient tolerated treatment well   Behavior During Therapy [facility] for tasks assessed/performed      Past Medical History:  Diagnosis Date  . Arthritis   . Dysrhythmia    afib  . Gallstones 07/1996  . Headache   . History of CT scan of head 1997   small vessel disease  . History of ETT 05/2002    no EKG changes  . History of MRI of cervical spine 07/15/2002  . Hypertension   . Lumbar spondylolysis 12/17/2016  . MGUS (monoclonal gammopathy of unknown significance) 04/11/2012  . Peripheral neuropathy ([place])   . Pneumonia 10/2014  . PONV (postoperative nausea and vomiting)    once many years ago  . Primary localized osteoarthrosis of left shoulder 12/25/2016  . Rectal bleeding 12/2002   colonoscopy  . Seizures ([place])    last seizure 1976  . Skin cancer   . Sleep apnea    ? to have study after surgery  . Stroke [facility])    ?tia ?afib  . TIA (transient ischemic attack)   . 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 ADENOIDECTOMY    . ANTERIOR CERVICAL DECOMP/DISCECTOMY FUSION N/A 02/04/2015   Procedure: ANTERIOR CERVICAL DECOMPRESSION/DISCECTOMY FUSION CERVICAL FIVE-SIX,CERVICAL SIX-SEVEN;  Surgeon: Karie Chimera, MD;  Location: [place] [facility];  Service: Neurosurgery;   Laterality: N/A;  . CATARACT EXTRACTION Bilateral   . COLONOSCOPY W/ POLYPECTOMY  04/19/2003   tubular adenoma  . GREAT TOE ARTHRODESIS, INTERPHALANGEAL JOINT Left 1978  . keratosis excision Right 05/1998   wrist  . nerve transfer surgery to lwft hand Left    Nov. 2017  . SHOULDER ARTHROSCOPY Left 13   rotator cuff   . SHOULDER INJECTION  June 2012   left, [place]  . SKIN CANCER EXCISION  07/1993   left lateral arm  . thumb injection  June 2012   left, [place]  . TONSILLECTOMY    . TOTAL SHOULDER ARTHROPLASTY Left 12/25/2016   Procedure: TOTAL SHOULDER ARTHROPLASTY;  Surgeon: Marchia Bond, MD;  Location: [place];  Service: Orthopedics;  Laterality: Left;  . ulnar nerve transfer  12/1999   left    There were no vitals filed for this visit.      Subjective Assessment - 02/20/17 1102    Subjective Feels like he can get his shoulder up high with pulleys - but can't actively lift arm up - explained this is likely due to general weakness   Patient Stated Goals Improve L shoulder function   Currently in Pain? Yes   Pain Score 5    Pain Location Shoulder  deltoid area   Pain Orientation Left   Pain Descriptors / Indicators Aching;Tightness  Pain Type Surgical pain            [facility] PT Assessment - 02/20/17 1112      AROM   Left Shoulder Flexion 130 Degrees  supine   Left Shoulder ABduction 65 Degrees  seated     PROM   Left Shoulder Flexion 135 Degrees   Left Shoulder ABduction 120 Degrees   Left Shoulder Internal Rotation 66 Degrees  30 deg abd   Left Shoulder External Rotation 20 Degrees  30 deg abd                     [facility] Adult PT Treatment/Exercise - 02/20/17 1134      Shoulder Exercises: Supine   Flexion Left;AROM;15 reps     Shoulder Exercises: Seated   Flexion AROM;Left;12 reps   Flexion Limitations no greater than 90 degrees   Abduction AROM;Left;12 reps     Shoulder Exercises: Standing   External Rotation Left;12 reps;Theraband    Theraband Level (Shoulder External Rotation) Level 1 (Yellow)   External Rotation Limitations isometric step outs   Internal Rotation Left;12 reps;Theraband   Theraband Level (Shoulder Internal Rotation) Level 1 (Yellow)   Internal Rotation Limitations isometric step outs   Other Standing Exercises bicep curls - 2# L UE x 15 reps; tricep extensions - 2# L UE x 15 reps (bent forward at waist)     Shoulder Exercises: Pulleys   Flexion 3 minutes   ABduction 3 minutes   ABduction Limitations scaption     Modalities   Modalities Electrical Stimulation;Vasopneumatic     Electrical Stimulation   Electrical Stimulation Location L shoulder   Electrical Stimulation Action IFC   Electrical Stimulation Parameters to tolerance   Electrical Stimulation Goals Pain;Tone     Vasopneumatic   Number Minutes Vasopneumatic  15 minutes   Vasopnuematic Location  Shoulder  bilateral   Vasopneumatic Pressure Medium   Vasopneumatic Temperature  coldest temp.      Manual Therapy   Manual Therapy Soft tissue mobilization;Passive ROM   Manual therapy comments patient supine with bolster under knees   Soft tissue mobilization STM to L pec region, L lateral shoulder, L anterior and posterior capsule   Passive ROM Gentle PROM in all planes: flexion and abduction to tolerance with hold at end range; IR and ER at 30 degrees abduction to tolerance with hold at end range.                   PT Short Term Goals - 02/07/17 1525      PT SHORT TERM GOAL #1   Title Patient to be independent with intial stretching/strengthening HEP (03/05/17)   Status On-going     PT SHORT TERM GOAL #2   Title Patient to improve L shoulder PROM: flexion 160, ER 75, and IR 80 - without pain limiting (03/05/17)   Status On-going     PT SHORT TERM GOAL #3   Title Patient to improve gross L shoulder strength to 3+/5 (03/05/17)   Status On-going           PT Long Term Goals - 02/07/17 1526      PT LONG TERM GOAL #1    Title Patient to be independent with advanced HEP for L shoulder strengthening (04/02/17)   Status On-going     PT LONG TERM GOAL #2   Title Patient to improve L shoulder AROM: flexion 160, ER 75, IR 80 wihtout pain limiting (04/02/17)   Status  On-going     PT LONG TERM GOAL #3   Title Patient to demonstrate ability to perform ADLs and basic household chores without pain or functional limatations (04/02/17)   Status On-going     PT LONG TERM GOAL #4   Title Patient to improve L shoulder strength to >/= 4+/5 (04/02/17)   Status On-going               Plan - 02/20/17 1203    Clinical Impression Statement Patient doing well today - continues to do very well with PROM of shoulder, with ER most limited at this time. Re-inforced need to continue cane work in this area at home to progress further with good carryover. Seated and supine flexion initiated today with good muscle control and little to no pain. Isometric IR/ER with yellow tband also initiated with ER most difficult and with production of slight pain at posterior shoulder. Will continue to progress per protocol.    PT Treatment/Interventions ADLs/Self Care Home Management;Cryotherapy;Electrical Stimulation;Moist Heat;Ultrasound;Therapeutic exercise;Therapeutic activities;Patient/family education;Manual techniques;Scar mobilization;Passive range of motion;Vasopneumatic Device;Taping;Dry needling;Neuromuscular re-education   PT Next Visit Plan gentle PROM as tolerated; review wand & isometric exercises; continue vasopneumatic compression as indicated; estim as indicated for pain, progress per protocol.    Consulted and Agree with Plan of Care Patient      Patient will benefit from skilled therapeutic intervention in order to improve the following deficits and impairments:  Pain, Impaired UE functional use, Decreased strength, Decreased range of motion, Decreased activity tolerance, Decreased endurance  Visit Diagnosis: Acute pain of left  shoulder  Stiffness of left shoulder, not elsewhere classified  Muscle weakness (generalized)  Abnormal posture     Problem List Patient Active Problem List   Diagnosis Date Noted  . Primary localized osteoarthrosis of left shoulder 12/25/2016  . S/P shoulder replacement 12/25/2016  . Lumbar spondylolysis 12/17/2016  . Paresthesia 01/24/2016  . 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 extremity 03/29/2015  . Cervical spinal stenosis 02/04/2015  . Bronchiectasis without acute exacerbation ([place]) 01/11/2015  . Ulnar neuropathy at elbow 12/24/2014  . TIA (transient ischemic attack) 12/15/2014  . Hyperlipidemia 11/24/2014  . Hypoxia   . New onset a-fib ([place])   . MGUS (monoclonal gammopathy of unknown significance) 04/11/2012  . SEBORRHEIC KERATOSIS 11/09/2010  . CPK, ABNORMAL 09/03/2010  . DERMATOPHYTOSIS OF FOOT 08/03/2010  . MYALGIA 12/27/2009  . GOUT, UNSPECIFIED 10/04/2009  . HYPERTROPHY PROSTATE W/O UR OBST & OTH LUTS 12/14/2008  . ALLERGIC CONJUNCTIVITIS 12/03/2007  . TESTOSTERONE DEFICIENCY 11/14/2007  . Backache 04/22/2007  . HYPERLIPIDEMIA 12/26/2006  . OBESITY, NOS 12/26/2006  . 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 Hereditary and idiopathic peripheral neuropathy 12/26/2006  . HEARING LOSS NOS OR DEAFNESS 12/26/2006  . HYPERTENSION, BENIGN SYSTEMIC 12/26/2006  . OSTEOARTHRITIS, MULTI SITES 12/26/2006  . Musculoskeletal disorder and symptoms referable to neck 12/26/2006    Lanney Gins, PT, DPT 02/20/17 12:10 PM   [facility] [address]  [place], [place], [address] Phone: [phone]   Fax:  [phone]  Name: Jose Essner. MRN: [id number] Date of Birth: [birth year]

## 2017-02-22 ENCOUNTER — Encounter: Payer: Self-pay | Admitting: Rehabilitation

## 2017-02-22 ENCOUNTER — Ambulatory Visit: Payer: Medicare Other | Admitting: Rehabilitation

## 2017-02-22 DIAGNOSIS — M25612 Stiffness of left shoulder, not elsewhere classified: Secondary | ICD-10-CM

## 2017-02-22 DIAGNOSIS — R293 Abnormal posture: Secondary | ICD-10-CM

## 2017-02-22 DIAGNOSIS — M6281 Muscle weakness (generalized): Secondary | ICD-10-CM

## 2017-02-22 DIAGNOSIS — M25512 Pain in left shoulder: Secondary | ICD-10-CM | POA: Diagnosis not present

## 2017-02-22 NOTE — Therapy (Signed)
[facility] [address]  [place], [place], [address] Phone: [phone]   Fax:  [phone]  Physical Therapy Treatment  Patient Details  Name: Jose [name]. MRN: [id number] Date of Birth: 29-Aug-1936 Referring Provider: Dr. Mardelle Matte  Encounter Date: 02/22/2017      PT End of Session - 02/22/17 1200    Visit Number 8   Date for PT Re-Evaluation 04/02/17   Authorization Type Medicare / Tricare - PT only   PT Start Time 1100   PT Stop Time 1204   PT Time Calculation (min) 64 min   Activity Tolerance Patient tolerated treatment well      Past Medical History:  Diagnosis Date  . Arthritis   . Dysrhythmia    afib  . Gallstones 07/1996  . Headache   . History of CT scan of head 1997   small vessel disease  . History of ETT 05/2002    no EKG changes  . History of MRI of cervical spine 07/15/2002  . Hypertension   . Lumbar spondylolysis 12/17/2016  . MGUS (monoclonal gammopathy of unknown significance) 04/11/2012  . Peripheral neuropathy   . Pneumonia 10/2014  . PONV (postoperative nausea and vomiting)    once many years ago  . Primary localized osteoarthrosis of left shoulder 12/25/2016  . Rectal bleeding 12/2002   colonoscopy  . Seizures ([place])    last seizure 1976  . Skin cancer   . Sleep apnea    ? to have study after surgery  . Stroke [facility])    ?tia ?afib  . TIA (transient ischemic attack)   . 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 ADENOIDECTOMY    . ANTERIOR CERVICAL DECOMP/DISCECTOMY FUSION N/A 02/04/2015   Procedure: ANTERIOR CERVICAL DECOMPRESSION/DISCECTOMY FUSION CERVICAL FIVE-SIX,CERVICAL SIX-SEVEN;  Surgeon: Karie Chimera, MD;  Location: [place] [facility];  Service: Neurosurgery;  Laterality: N/A;  . CATARACT EXTRACTION Bilateral   . COLONOSCOPY W/ POLYPECTOMY  04/19/2003    tubular adenoma  . GREAT TOE ARTHRODESIS, INTERPHALANGEAL JOINT Left 1978  . keratosis excision Right 05/1998   wrist  . nerve transfer surgery to lwft hand Left    Nov. 2017  . SHOULDER ARTHROSCOPY Left 13   rotator cuff   . SHOULDER INJECTION  June 2012   left, [place]  . SKIN CANCER EXCISION  07/1993   left lateral arm  . thumb injection  June 2012   left, [place]  . TONSILLECTOMY    . TOTAL SHOULDER ARTHROPLASTY Left 12/25/2016   Procedure: TOTAL SHOULDER ARTHROPLASTY;  Surgeon: Marchia Bond, MD;  Location: [facility];  Service: Orthopedics;  Laterality: Left;  . ulnar nerve transfer  12/1999   left    There were no vitals filed for this visit.      Subjective Assessment - 02/22/17 1156    Subjective no new complaints   Currently in Pain? Yes   Pain Score 5    Pain Location Shoulder   Pain Orientation Left   Pain Descriptors / Indicators Aching   Pain Type Surgical pain                         [facility] Adult PT Treatment/Exercise - 02/22/17 0001      Shoulder Exercises: Standing   External Rotation Left;12 reps;Theraband  Theraband Level (Shoulder External Rotation) Level 1 (Yellow)   External Rotation Limitations isometric step outs   Internal Rotation Left;12 reps;Theraband   Theraband Level (Shoulder Internal Rotation) Level 1 (Yellow)   Internal Rotation Limitations isometric step outs   Flexion AROM;15 reps   ABduction AROM;15 reps;Left   ABduction Limitations scaption   Row 15 reps   Theraband Level (Shoulder Row) Level 3 (Green)   Row Limitations L only     Shoulder Exercises: Pulleys   Flexion 3 minutes   ABduction 3 minutes   ABduction Limitations scaption     Shoulder Exercises: ROM/Strengthening   Ball on Wall towel flexion slides x 10      Electrical Stimulation   Electrical Stimulation Location L shoulder   Electrical Stimulation Action IFC   Electrical Stimulation Parameters to tolerance   Electrical Stimulation Goals Pain      Vasopneumatic   Number Minutes Vasopneumatic  15 minutes   Vasopnuematic Location  Shoulder  bil   Vasopneumatic Pressure Medium   Vasopneumatic Temperature  coldest temp.      Manual Therapy   Passive ROM PROM all planes; ER holds; ER/IR at 45deg abudction                  PT Short Term Goals - 02/07/17 1525      PT SHORT TERM GOAL #1   Title Patient to be independent with intial stretching/strengthening HEP (03/05/17)   Status On-going     PT SHORT TERM GOAL #2   Title Patient to improve L shoulder PROM: flexion 160, ER 75, and IR 80 - without pain limiting (03/05/17)   Status On-going     PT SHORT TERM GOAL #3   Title Patient to improve gross L shoulder strength to 3+/5 (03/05/17)   Status On-going           PT Long Term Goals - 02/07/17 1526      PT LONG TERM GOAL #1   Title Patient to be independent with advanced HEP for L shoulder strengthening (04/02/17)   Status On-going     PT LONG TERM GOAL #2   Title Patient to improve L shoulder AROM: flexion 160, ER 75, IR 80 wihtout pain limiting (04/02/17)   Status On-going     PT LONG TERM GOAL #3   Title Patient to demonstrate ability to perform ADLs and basic household chores without pain or functional limatations (04/02/17)   Status On-going     PT LONG TERM GOAL #4   Title Patient to improve L shoulder strength to >/= 4+/5 (04/02/17)   Status On-going               Plan - 02/22/17 1200    Clinical Impression Statement Continues to do well with therapy.  ER the most limited in ROM and strength.  Included more AROM / strength today with no pain   PT Next Visit Plan gentle PROM as tolerated; review wand & isometric exercises; continue vasopneumatic compression as indicated; estim as indicated for pain, progress per protocol.       Patient will benefit from skilled therapeutic intervention in order to improve the following deficits and impairments:     Visit Diagnosis: Acute pain of left  shoulder  Stiffness of left shoulder, not elsewhere classified  Muscle weakness (generalized)  Abnormal posture     Problem List Patient Active Problem List   Diagnosis Date Noted  . Primary localized osteoarthrosis of left shoulder 12/25/2016  .  S/P shoulder replacement 12/25/2016  . Lumbar spondylolysis 12/17/2016  . Paresthesia 01/24/2016  . 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 extremity 03/29/2015  . Cervical spinal stenosis 02/04/2015  . Bronchiectasis without acute exacerbation ([place]) 01/11/2015  . Ulnar neuropathy at elbow 12/24/2014  . TIA (transient ischemic attack) 12/15/2014  . Hyperlipidemia 11/24/2014  . Hypoxia   . New onset a-fib ([place])   . MGUS (monoclonal gammopathy of unknown significance) 04/11/2012  . SEBORRHEIC KERATOSIS 11/09/2010  . CPK, ABNORMAL 09/03/2010  . DERMATOPHYTOSIS OF FOOT 08/03/2010  . MYALGIA 12/27/2009  . GOUT, UNSPECIFIED 10/04/2009  . HYPERTROPHY PROSTATE W/O UR OBST & OTH LUTS 12/14/2008  . ALLERGIC CONJUNCTIVITIS 12/03/2007  . TESTOSTERONE DEFICIENCY 11/14/2007  . Backache 04/22/2007  . HYPERLIPIDEMIA 12/26/2006  . OBESITY, NOS 12/26/2006  . 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 Hereditary and idiopathic peripheral neuropathy 12/26/2006  . HEARING LOSS NOS OR DEAFNESS 12/26/2006  . HYPERTENSION, BENIGN SYSTEMIC 12/26/2006  . OSTEOARTHRITIS, MULTI SITES 12/26/2006  . Musculoskeletal disorder and symptoms referable to neck 12/26/2006    Stark Bray, DPT, CMP 02/22/2017, 12:07 PM  [facility] [address]  [place], [place], [address] Phone: [phone]   Fax:  [phone]  Name: Natthew Marlatt. MRN: [id number] Date of Birth: June 25, 1936

## 2017-02-25 ENCOUNTER — Ambulatory Visit: Payer: Medicare Other | Admitting: Physical Therapy

## 2017-02-25 DIAGNOSIS — M25512 Pain in left shoulder: Secondary | ICD-10-CM | POA: Diagnosis not present

## 2017-02-25 DIAGNOSIS — R293 Abnormal posture: Secondary | ICD-10-CM

## 2017-02-25 DIAGNOSIS — M25612 Stiffness of left shoulder, not elsewhere classified: Secondary | ICD-10-CM

## 2017-02-25 DIAGNOSIS — M6281 Muscle weakness (generalized): Secondary | ICD-10-CM

## 2017-02-25 NOTE — Therapy (Signed)
[facility] [address]  [place], [place], [address] Phone: [phone]   Fax:  [phone]  Physical Therapy Treatment  Patient Details  Name: Jose [name]. MRN: [id number] Date of Birth: [birth year] Referring Provider: Dr. Mardelle Matte  Encounter Date: 02/25/2017      PT End of Session - 02/25/17 1246    Visit Number 9   Number of Visits 24   Date for PT Re-Evaluation 04/02/17   Authorization Type Medicare / Tricare - PT only   PT Start Time 1103   PT Stop Time 1200   PT Time Calculation (min) 57 min   Activity Tolerance Patient tolerated treatment well   Behavior During Therapy [facility] for tasks assessed/performed      Past Medical History:  Diagnosis Date  . Arthritis   . Dysrhythmia    afib  . Gallstones 07/1996  . Headache   . History of CT scan of head 1997   small vessel disease  . History of ETT 05/2002    no EKG changes  . History of MRI of cervical spine 07/15/2002  . Hypertension   . Lumbar spondylolysis 12/17/2016  . MGUS (monoclonal gammopathy of unknown significance) 04/11/2012  . Peripheral neuropathy   . Pneumonia 10/2014  . PONV (postoperative nausea and vomiting)    once many years ago  . Primary localized osteoarthrosis of left shoulder 12/25/2016  . Rectal bleeding 12/2002   colonoscopy  . Seizures ([place])    last seizure 1976  . Skin cancer   . Sleep apnea    ? to have study after surgery  . Stroke [facility])    ?tia ?afib  . TIA (transient ischemic attack)   . 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 ADENOIDECTOMY    . ANTERIOR CERVICAL DECOMP/DISCECTOMY FUSION N/A 02/04/2015   Procedure: ANTERIOR CERVICAL DECOMPRESSION/DISCECTOMY FUSION CERVICAL FIVE-SIX,CERVICAL SIX-SEVEN;  Surgeon: Karie Chimera, MD;  Location: [facility];  Service: Neurosurgery;  Laterality:  N/A;  . CATARACT EXTRACTION Bilateral   . COLONOSCOPY W/ POLYPECTOMY  04/19/2003   tubular adenoma  . GREAT TOE ARTHRODESIS, INTERPHALANGEAL JOINT Left 1978  . keratosis excision Right 05/1998   wrist  . nerve transfer surgery to lwft hand Left    Nov. 2017  . SHOULDER ARTHROSCOPY Left 13   rotator cuff   . SHOULDER INJECTION  June 2012   left, [place]  . SKIN CANCER EXCISION  07/1993   left lateral arm  . thumb injection  June 2012   left, [place]  . TONSILLECTOMY    . TOTAL SHOULDER ARTHROPLASTY Left 12/25/2016   Procedure: TOTAL SHOULDER ARTHROPLASTY;  Surgeon: Marchia Bond, MD;  Location: [place];  Service: Orthopedics;  Laterality: Left;  . ulnar nerve transfer  12/1999   left    There were no vitals filed for this visit.      Subjective Assessment - 02/25/17 1246    Subjective feeling well - no issues wiht current HEP   Patient Stated Goals Improve L shoulder function   Currently in Pain? Yes   Pain Score 4    Pain Location Shoulder   Pain Orientation Left   Pain Descriptors / Indicators Aching   Pain Type Surgical pain  [place] Adult PT Treatment/Exercise - 02/25/17 1113      Shoulder Exercises: Supine   Flexion Strengthening;Left;15 reps;Weights   Shoulder Flexion Weight (lbs) 1     Shoulder Exercises: Seated   Flexion Strengthening;Left;15 reps;Weights   Flexion Weight (lbs) 1   Flexion Limitations 2 sets - to shoulder height - focusing on endurance      Shoulder Exercises: Standing   External Rotation Strengthening;Left;10 reps;Theraband   Theraband Level (Shoulder External Rotation) Level 1 (Yellow)   External Rotation Limitations isometric step outs   Internal Rotation Strengthening;Left;10 reps;Theraband   Theraband Level (Shoulder Internal Rotation) Level 1 (Yellow)   Internal Rotation Limitations isometric step outs     Shoulder Exercises: Pulleys   Flexion 3 minutes   ABduction 3 minutes   ABduction  Limitations scaption     Shoulder Exercises: ROM/Strengthening   Ball on Wall towel flexion slides x 15     Modalities   Modalities Electrical Stimulation;Vasopneumatic     Electrical Stimulation   Electrical Stimulation Location L shoulder   Electrical Stimulation Action IFC   Electrical Stimulation Parameters to tolerance   Electrical Stimulation Goals Pain     Vasopneumatic   Number Minutes Vasopneumatic  15 minutes   Vasopnuematic Location  Shoulder   Vasopneumatic Pressure Medium   Vasopneumatic Temperature  coldest temp.      Manual Therapy   Manual Therapy Soft tissue mobilization;Passive ROM   Manual therapy comments patient supine with bolster under knees   Soft tissue mobilization STM to L lats, infraspinatus, teres group   Passive ROM PROM all planes; ER holds; ER/IR at 45deg abudction                  PT Short Term Goals - 02/07/17 1525      PT SHORT TERM GOAL #1   Title Patient to be independent with intial stretching/strengthening HEP (03/05/17)   Status On-going     PT SHORT TERM GOAL #2   Title Patient to improve L shoulder PROM: flexion 160, ER 75, and IR 80 - without pain limiting (03/05/17)   Status On-going     PT SHORT TERM GOAL #3   Title Patient to improve gross L shoulder strength to 3+/5 (03/05/17)   Status On-going           PT Long Term Goals - 02/07/17 1526      PT LONG TERM GOAL #1   Title Patient to be independent with advanced HEP for L shoulder strengthening (04/02/17)   Status On-going     PT LONG TERM GOAL #2   Title Patient to improve L shoulder AROM: flexion 160, ER 75, IR 80 wihtout pain limiting (04/02/17)   Status On-going     PT LONG TERM GOAL #3   Title Patient to demonstrate ability to perform ADLs and basic household chores without pain or functional limatations (04/02/17)   Status On-going     PT LONG TERM GOAL #4   Title Patient to improve L shoulder strength to >/= 4+/5 (04/02/17)   Status On-going                Plan - 02/25/17 1247    Clinical Impression Statement Patient doing well with all home exercises as well as with general function of L shoulder - making good progress with AROM and strength. ER of L shoulder continues to be most limited and will benefit from continued work in this area.    PT Treatment/Interventions ADLs/Self Care  Home Management;Cryotherapy;Electrical Stimulation;Moist Heat;Ultrasound;Therapeutic exercise;Therapeutic activities;Patient/family education;Manual techniques;Scar mobilization;Passive range of motion;Vasopneumatic Device;Taping;Dry needling;Neuromuscular re-education   PT Next Visit Plan gentle PROM as tolerated; review wand & isometric exercises; continue vasopneumatic compression as indicated; estim as indicated for pain, progress per protocol.    Consulted and Agree with Plan of Care Patient      Patient will benefit from skilled therapeutic intervention in order to improve the following deficits and impairments:  Pain, Impaired UE functional use, Decreased strength, Decreased range of motion, Decreased activity tolerance, Decreased endurance  Visit Diagnosis: Acute pain of left shoulder  Stiffness of left shoulder, not elsewhere classified  Muscle weakness (generalized)  Abnormal posture     Problem List Patient Active Problem List   Diagnosis Date Noted  . Primary localized osteoarthrosis of left shoulder 12/25/2016  . S/P shoulder replacement 12/25/2016  . Lumbar spondylolysis 12/17/2016  . Paresthesia 01/24/2016  . 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 extremity 03/29/2015  . Cervical spinal stenosis 02/04/2015  . Bronchiectasis without acute exacerbation ([place]) 01/11/2015  . Ulnar neuropathy at elbow 12/24/2014  . TIA (transient ischemic attack) 12/15/2014  . Hyperlipidemia 11/24/2014  . Hypoxia   . New onset a-fib ([place])   . MGUS (monoclonal gammopathy of unknown significance) 04/11/2012  . SEBORRHEIC KERATOSIS  11/09/2010  . CPK, ABNORMAL 09/03/2010  . DERMATOPHYTOSIS OF FOOT 08/03/2010  . MYALGIA 12/27/2009  . GOUT, UNSPECIFIED 10/04/2009  . HYPERTROPHY PROSTATE W/O UR OBST & OTH LUTS 12/14/2008  . ALLERGIC CONJUNCTIVITIS 12/03/2007  . TESTOSTERONE DEFICIENCY 11/14/2007  . Backache 04/22/2007  . HYPERLIPIDEMIA 12/26/2006  . OBESITY, NOS 12/26/2006  . 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 Hereditary and idiopathic peripheral neuropathy 12/26/2006  . HEARING LOSS NOS OR DEAFNESS 12/26/2006  . HYPERTENSION, BENIGN SYSTEMIC 12/26/2006  . OSTEOARTHRITIS, MULTI SITES 12/26/2006  . Musculoskeletal disorder and symptoms referable to neck 12/26/2006     Lanney Gins, PT, DPT 02/25/17 12:50 PM   [facility] [address]  [place], [place], [address] Phone: [phone]   Fax:  [phone]  Name: Jose [name]. MRN: [id number] Date of Birth: Oct 01, 1936

## 2017-02-27 ENCOUNTER — Ambulatory Visit: Payer: Medicare Other | Attending: Orthopedic Surgery | Admitting: Physical Therapy

## 2017-02-27 DIAGNOSIS — M25612 Stiffness of left shoulder, not elsewhere classified: Secondary | ICD-10-CM | POA: Diagnosis present

## 2017-02-27 DIAGNOSIS — M6281 Muscle weakness (generalized): Secondary | ICD-10-CM | POA: Diagnosis present

## 2017-02-27 DIAGNOSIS — M25512 Pain in left shoulder: Secondary | ICD-10-CM | POA: Diagnosis not present

## 2017-02-27 DIAGNOSIS — R293 Abnormal posture: Secondary | ICD-10-CM | POA: Insufficient documentation

## 2017-02-27 NOTE — Therapy (Signed)
[facility] [address]  [place], [place], [address] Phone: [phone]   Fax:  [phone]  Physical Therapy Treatment  Patient Details  Name: Jose [name]. MRN: [id number] Date of Birth: [birth year] Referring Provider: Dr. Mardelle Matte  Encounter Date: 02/27/2017      PT End of Session - 02/27/17 1319    Visit Number 10   Number of Visits 24   Date for PT Re-Evaluation 04/02/17   Authorization Type Medicare / Tricare - PT only   PT Start Time 1316   PT Stop Time 1414   PT Time Calculation (min) 58 min   Activity Tolerance Patient tolerated treatment well   Behavior During Therapy [facility] for tasks assessed/performed      Past Medical History:  Diagnosis Date  . Arthritis   . Dysrhythmia    afib  . Gallstones 07/1996  . Headache   . History of CT scan of head 1997   small vessel disease  . History of ETT 05/2002    no EKG changes  . History of MRI of cervical spine 07/15/2002  . Hypertension   . Lumbar spondylolysis 12/17/2016  . MGUS (monoclonal gammopathy of unknown significance) 04/11/2012  . Peripheral neuropathy   . Pneumonia 10/2014  . PONV (postoperative nausea and vomiting)    once many years ago  . Primary localized osteoarthrosis of left shoulder 12/25/2016  . Rectal bleeding 12/2002   colonoscopy  . Seizures ([place])    last seizure 1976  . Skin cancer   . Sleep apnea    ? to have study after surgery  . Stroke [facility])    ?tia ?afib  . TIA (transient ischemic attack)   . 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 ADENOIDECTOMY    . ANTERIOR CERVICAL DECOMP/DISCECTOMY FUSION N/A 02/04/2015   Procedure: ANTERIOR CERVICAL DECOMPRESSION/DISCECTOMY FUSION CERVICAL FIVE-SIX,CERVICAL SIX-SEVEN;  Surgeon: Karie Chimera, MD;  Location: [facility];  Service: Neurosurgery;  Laterality:  N/A;  . CATARACT EXTRACTION Bilateral   . COLONOSCOPY W/ POLYPECTOMY  04/19/2003   tubular adenoma  . GREAT TOE ARTHRODESIS, INTERPHALANGEAL JOINT Left 1978  . keratosis excision Right 05/1998   wrist  . nerve transfer surgery to lwft hand Left    Nov. 2017  . SHOULDER ARTHROSCOPY Left 13   rotator cuff   . SHOULDER INJECTION  June 2012   left, [place]  . SKIN CANCER EXCISION  07/1993   left lateral arm  . thumb injection  June 2012   left, [place]  . TONSILLECTOMY    . TOTAL SHOULDER ARTHROPLASTY Left 12/25/2016   Procedure: TOTAL SHOULDER ARTHROPLASTY;  Surgeon: Marchia Bond, MD;  Location: [facility];  Service: Orthopedics;  Laterality: Left;  . ulnar nerve transfer  12/1999   left    There were no vitals filed for this visit.      Subjective Assessment - 02/27/17 1318    Subjective is starting to see the progress - happy with current improvements   Patient Stated Goals Improve L shoulder function   Currently in Pain? Yes   Pain Score 2    Pain Location Shoulder   Pain Orientation Left   Pain Descriptors / Indicators Tightness  with pulleys and exercises   Pain Type Surgical pain  [facility] PT Assessment - 02/27/17 0001      PROM   Left Shoulder Flexion 146 Degrees   Left Shoulder ABduction 138 Degrees   Left Shoulder Internal Rotation 65 Degrees   Left Shoulder External Rotation 34 Degrees                     [facility] Adult PT Treatment/Exercise - 02/27/17 1321      Shoulder Exercises: Standing   External Rotation Strengthening;Left;15 reps;Theraband   Theraband Level (Shoulder External Rotation) Level 1 (Yellow)   External Rotation Limitations concentric - 2 sets    Internal Rotation Strengthening;Left;15 reps;Theraband   Theraband Level (Shoulder Internal Rotation) Level 1 (Yellow)   Internal Rotation Limitations concentric - 2 sets    Row Strengthening;Both;15 reps;Theraband   Theraband Level (Shoulder Row) Level 3 (Green)   Row Limitations  emphasis on posturing     Shoulder Exercises: Pulleys   Flexion 3 minutes   ABduction 3 minutes     Shoulder Exercises: ROM/Strengthening   Ball on Wall orange medball - 2# at wrist x 8 reps    Other ROM/Strengthening Exercises towel flexion slides x 15     Modalities   Modalities Electrical Stimulation;Vasopneumatic     Electrical Stimulation   Electrical Stimulation Location L shoulder   Electrical Stimulation Action IFC   Electrical Stimulation Parameters to tolerance   Electrical Stimulation Goals Pain     Vasopneumatic   Number Minutes Vasopneumatic  15 minutes   Vasopnuematic Location  Shoulder   Vasopneumatic Pressure Medium   Vasopneumatic Temperature  coldest temp     Manual Therapy   Manual Therapy Soft tissue mobilization;Passive ROM   Manual therapy comments patient supine with bolster under knees   Soft tissue mobilization STM to L lats, infraspinatus, teres group, pev major/minor   Passive ROM PROM all planes - emphasis with ER holds for improved ROM                  PT Short Term Goals - 02/07/17 1525      PT SHORT TERM GOAL #1   Title Patient to be independent with intial stretching/strengthening HEP (03/05/17)   Status On-going     PT SHORT TERM GOAL #2   Title Patient to improve L shoulder PROM: flexion 160, ER 75, and IR 80 - without pain limiting (03/05/17)   Status On-going     PT SHORT TERM GOAL #3   Title Patient to improve gross L shoulder strength to 3+/5 (03/05/17)   Status On-going           PT Long Term Goals - 02/07/17 1526      PT LONG TERM GOAL #1   Title Patient to be independent with advanced HEP for L shoulder strengthening (04/02/17)   Status On-going     PT LONG TERM GOAL #2   Title Patient to improve L shoulder AROM: flexion 160, ER 75, IR 80 wihtout pain limiting (04/02/17)   Status On-going     PT LONG TERM GOAL #3   Title Patient to demonstrate ability to perform ADLs and basic household chores without pain or  functional limatations (04/02/17)   Status On-going     PT LONG TERM GOAL #4   Title Patient to improve L shoulder strength to >/= 4+/5 (04/02/17)   Status On-going               Plan - 02/27/17 1320    Clinical Impression Statement Good  transition today from isometric theraband stengthening to more concentric muscle work with very minimal pain. Most difficulty with both stretching into ER as well as ER strengthening requiring continued work. Good progression of overhead ROM with addition of little weight - fatigue limting factor.    PT Treatment/Interventions ADLs/Self Care Home Management;Cryotherapy;Electrical Stimulation;Moist Heat;Ultrasound;Therapeutic exercise;Therapeutic activities;Patient/family education;Manual techniques;Scar mobilization;Passive range of motion;Vasopneumatic Device;Taping;Dry needling;Neuromuscular re-education   PT Next Visit Plan gentle PROM as tolerated; review wand & isometric exercises; continue vasopneumatic compression as indicated; estim as indicated for pain, progress per protocol.    Consulted and Agree with Plan of Care Patient      Patient will benefit from skilled therapeutic intervention in order to improve the following deficits and impairments:  Pain, Impaired UE functional use, Decreased strength, Decreased range of motion, Decreased activity tolerance, Decreased endurance  Visit Diagnosis: Acute pain of left shoulder  Stiffness of left shoulder, not elsewhere classified  Muscle weakness (generalized)  Abnormal posture       G-Codes - 2017/03/05 1320    Functional Assessment Tool Used (Outpatient Only) FOTO: 59 (41% limited)   Functional Limitation Carrying, moving and handling objects   Carrying, Moving and Handling Objects Current Status (Y3338) At least 40 percent but less than 60 percent impaired, limited or restricted   Carrying, Moving and Handling Objects Goal Status (V2919) At least 20 percent but less than 40 percent impaired,  limited or restricted      Problem List Patient Active Problem List   Diagnosis Date Noted  . Primary localized osteoarthrosis of left shoulder 12/25/2016  . S/P shoulder replacement 12/25/2016  . Lumbar spondylolysis 12/17/2016  . Paresthesia 01/24/2016  . 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 extremity 03/29/2015  . Cervical spinal stenosis 02/04/2015  . Bronchiectasis without acute exacerbation ([place]) 01/11/2015  . Ulnar neuropathy at elbow 12/24/2014  . TIA (transient ischemic attack) 12/15/2014  . Hyperlipidemia 11/24/2014  . Hypoxia   . New onset a-fib ([place])   . MGUS (monoclonal gammopathy of unknown significance) 04/11/2012  . SEBORRHEIC KERATOSIS 11/09/2010  . CPK, ABNORMAL 09/03/2010  . DERMATOPHYTOSIS OF FOOT 08/03/2010  . MYALGIA 12/27/2009  . GOUT, UNSPECIFIED 10/04/2009  . HYPERTROPHY PROSTATE W/O UR OBST & OTH LUTS 12/14/2008  . ALLERGIC CONJUNCTIVITIS 12/03/2007  . TESTOSTERONE DEFICIENCY 11/14/2007  . Backache 04/22/2007  . HYPERLIPIDEMIA 12/26/2006  . OBESITY, NOS 12/26/2006  . 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 Hereditary and idiopathic peripheral neuropathy 12/26/2006  . HEARING LOSS NOS OR DEAFNESS 12/26/2006  . HYPERTENSION, BENIGN SYSTEMIC 12/26/2006  . OSTEOARTHRITIS, MULTI SITES 12/26/2006  . Musculoskeletal disorder and symptoms referable to neck 12/26/2006     Lanney Gins, PT, DPT 03/05/2017 2:55 PM   [facility] [address]  [place], [place], [address] Phone: [phone]   Fax:  [phone]  Name: Jose [name]. MRN: [id number] Date of Birth: January 22, 1936

## 2017-03-01 ENCOUNTER — Ambulatory Visit: Payer: Medicare Other | Admitting: Physical Therapy

## 2017-03-01 DIAGNOSIS — R293 Abnormal posture: Secondary | ICD-10-CM

## 2017-03-01 DIAGNOSIS — M25512 Pain in left shoulder: Secondary | ICD-10-CM | POA: Diagnosis not present

## 2017-03-01 DIAGNOSIS — M6281 Muscle weakness (generalized): Secondary | ICD-10-CM

## 2017-03-01 DIAGNOSIS — M25612 Stiffness of left shoulder, not elsewhere classified: Secondary | ICD-10-CM

## 2017-03-01 NOTE — Therapy (Signed)
[facility] [address]  [place], [place], [address] Phone: [phone]   Fax:  [phone]  Physical Therapy Treatment  Patient Details  Name: Jose [name]. MRN: [id number] Date of Birth: Jul 12, 1936 Referring Provider: Dr. Mardelle Matte  Encounter Date: 03/01/2017      PT End of Session - 03/01/17 1117    Visit Number 11   Number of Visits 24   Date for PT Re-Evaluation 04/02/17   Authorization Type Medicare / Tricare - PT only   PT Start Time 1103   PT Stop Time 1206   PT Time Calculation (min) 63 min   Activity Tolerance Patient tolerated treatment well   Behavior During Therapy [facility] for tasks assessed/performed      Past Medical History:  Diagnosis Date  . Arthritis   . Dysrhythmia    afib  . Gallstones 07/1996  . Headache   . History of CT scan of head 1997   small vessel disease  . History of ETT 05/2002    no EKG changes  . History of MRI of cervical spine 07/15/2002  . Hypertension   . Lumbar spondylolysis 12/17/2016  . MGUS (monoclonal gammopathy of unknown significance) 04/11/2012  . Peripheral neuropathy   . Pneumonia 10/2014  . PONV (postoperative nausea and vomiting)    once many years ago  . Primary localized osteoarthrosis of left shoulder 12/25/2016  . Rectal bleeding 12/2002   colonoscopy  . Seizures ([place])    last seizure 1976  . Skin cancer   . Sleep apnea    ? to have study after surgery  . Stroke [facility])    ?tia ?afib  . TIA (transient ischemic attack)   . 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 ADENOIDECTOMY    . ANTERIOR CERVICAL DECOMP/DISCECTOMY FUSION N/A 02/04/2015   Procedure: ANTERIOR CERVICAL DECOMPRESSION/DISCECTOMY FUSION CERVICAL FIVE-SIX,CERVICAL SIX-SEVEN;  Surgeon: Karie Chimera, MD;  Location: [facility];  Service: Neurosurgery;  Laterality:  N/A;  . CATARACT EXTRACTION Bilateral   . COLONOSCOPY W/ POLYPECTOMY  04/19/2003   tubular adenoma  . GREAT TOE ARTHRODESIS, INTERPHALANGEAL JOINT Left 1978  . keratosis excision Right 05/1998   wrist  . nerve transfer surgery to lwft hand Left    Nov. 2017  . SHOULDER ARTHROSCOPY Left 13   rotator cuff   . SHOULDER INJECTION  June 2012   left, [place]  . SKIN CANCER EXCISION  07/1993   left lateral arm  . thumb injection  June 2012   left, [place]  . TONSILLECTOMY    . TOTAL SHOULDER ARTHROPLASTY Left 12/25/2016   Procedure: TOTAL SHOULDER ARTHROPLASTY;  Surgeon: Marchia Bond, MD;  Location: [facility];  Service: Orthopedics;  Laterality: Left;  . ulnar nerve transfer  12/1999   left    There were no vitals filed for this visit.      Subjective Assessment - 03/01/17 1111    Subjective doing well today - no complaints   Patient Stated Goals Improve L shoulder function   Currently in Pain? Yes   Pain Score 2    Pain Location Shoulder   Pain Orientation Left   Pain Descriptors / Indicators Sore;Tightness   Pain Type Surgical pain  [facility] Adult PT Treatment/Exercise - 03/01/17 1111      Shoulder Exercises: Seated   Flexion Strengthening;Left;15 reps;Weights   Flexion Limitations 2 sets; 1 set 1#; 1 set 2 # - only to approx 90 degrees   Abduction AROM;Left;12 reps   Other Seated Exercises PROM into flexion with isometric hold and eccentric lowering by patient x 10; same with abduction x 10     Shoulder Exercises: Standing   External Rotation Strengthening;Left;15 reps;Theraband   Theraband Level (Shoulder External Rotation) Level 1 (Yellow)   Internal Rotation Strengthening;Left;15 reps;Theraband   Theraband Level (Shoulder Internal Rotation) Level 1 (Yellow)   Flexion AAROM;Left;5 reps   Flexion Limitations wall ladder with hold at top     Shoulder Exercises: Pulleys   Flexion 3 minutes   ABduction 3 minutes     Modalities    Modalities Electrical Stimulation;Cryotherapy     Cryotherapy   Number Minutes Cryotherapy 15 Minutes   Cryotherapy Location Shoulder  bilateral   Type of Cryotherapy Ice pack     Electrical Stimulation   Electrical Stimulation Location L shoulder   Electrical Stimulation Action IFC   Electrical Stimulation Parameters to tolerance   Electrical Stimulation Goals Pain                  PT Short Term Goals - 02/07/17 1525      PT SHORT TERM GOAL #1   Title Patient to be independent with intial stretching/strengthening HEP (03/05/17)   Status On-going     PT SHORT TERM GOAL #2   Title Patient to improve L shoulder PROM: flexion 160, ER 75, and IR 80 - without pain limiting (03/05/17)   Status On-going     PT SHORT TERM GOAL #3   Title Patient to improve gross L shoulder strength to 3+/5 (03/05/17)   Status On-going           PT Long Term Goals - 02/07/17 1526      PT LONG TERM GOAL #1   Title Patient to be independent with advanced HEP for L shoulder strengthening (04/02/17)   Status On-going     PT LONG TERM GOAL #2   Title Patient to improve L shoulder AROM: flexion 160, ER 75, IR 80 wihtout pain limiting (04/02/17)   Status On-going     PT LONG TERM GOAL #3   Title Patient to demonstrate ability to perform ADLs and basic household chores without pain or functional limatations (04/02/17)   Status On-going     PT LONG TERM GOAL #4   Title Patient to improve L shoulder strength to >/= 4+/5 (04/02/17)   Status On-going               Plan - 03/01/17 1117    Clinical Impression Statement Mr. Meinders doing well today - progression of strenghtening with good tolerance, however weakness and fatigue main limiting factor. Patient education to begin more AROM tasks (abduction) to tolerance for hopeful improved AROM and strength in this area with good carryover.    PT Treatment/Interventions ADLs/Self Care Home Management;Cryotherapy;Electrical Stimulation;Moist  Heat;Ultrasound;Therapeutic exercise;Therapeutic activities;Patient/family education;Manual techniques;Scar mobilization;Passive range of motion;Vasopneumatic Device;Taping;Dry needling;Neuromuscular re-education   PT Next Visit Plan gentle PROM as tolerated; review wand & isometric exercises; continue vasopneumatic compression as indicated; estim as indicated for pain, progress per protocol.    Consulted and Agree with Plan of Care Patient      Patient will benefit from skilled therapeutic intervention in order to improve the following deficits  and impairments:  Pain, Impaired UE functional use, Decreased strength, Decreased range of motion, Decreased activity tolerance, Decreased endurance  Visit Diagnosis: Acute pain of left shoulder  Stiffness of left shoulder, not elsewhere classified  Muscle weakness (generalized)  Abnormal posture     Problem List Patient Active Problem List   Diagnosis Date Noted  . Primary localized osteoarthrosis of left shoulder 12/25/2016  . S/P shoulder replacement 12/25/2016  . Lumbar spondylolysis 12/17/2016  . Paresthesia 01/24/2016  . 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 extremity 03/29/2015  . Cervical spinal stenosis 02/04/2015  . Bronchiectasis without acute exacerbation ([place]) 01/11/2015  . Ulnar neuropathy at elbow 12/24/2014  . TIA (transient ischemic attack) 12/15/2014  . Hyperlipidemia 11/24/2014  . Hypoxia   . New onset a-fib ([place])   . MGUS (monoclonal gammopathy of unknown significance) 04/11/2012  . SEBORRHEIC KERATOSIS 11/09/2010  . CPK, ABNORMAL 09/03/2010  . DERMATOPHYTOSIS OF FOOT 08/03/2010  . MYALGIA 12/27/2009  . GOUT, UNSPECIFIED 10/04/2009  . HYPERTROPHY PROSTATE W/O UR OBST & OTH LUTS 12/14/2008  . ALLERGIC CONJUNCTIVITIS 12/03/2007  . TESTOSTERONE DEFICIENCY 11/14/2007  . Backache 04/22/2007  . HYPERLIPIDEMIA 12/26/2006  . OBESITY, NOS 12/26/2006  . 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 Hereditary and idiopathic  peripheral neuropathy 12/26/2006  . HEARING LOSS NOS OR DEAFNESS 12/26/2006  . HYPERTENSION, BENIGN SYSTEMIC 12/26/2006  . OSTEOARTHRITIS, MULTI SITES 12/26/2006  . Musculoskeletal disorder and symptoms referable to neck 12/26/2006     Lanney Gins, PT, DPT 03/01/17 12:16 PM   [facility] [address]  [place], [place], [address] Phone: [phone]   Fax:  [phone]  Name: Hymie Gorr. MRN: [id number] Date of Birth: 06/23/1936

## 2017-03-04 ENCOUNTER — Ambulatory Visit: Payer: Medicare Other | Admitting: Physical Therapy

## 2017-03-04 DIAGNOSIS — R293 Abnormal posture: Secondary | ICD-10-CM

## 2017-03-04 DIAGNOSIS — M6281 Muscle weakness (generalized): Secondary | ICD-10-CM

## 2017-03-04 DIAGNOSIS — M25512 Pain in left shoulder: Secondary | ICD-10-CM | POA: Diagnosis not present

## 2017-03-04 DIAGNOSIS — M25612 Stiffness of left shoulder, not elsewhere classified: Secondary | ICD-10-CM

## 2017-03-04 NOTE — Therapy (Signed)
[facility] [address]  [place], [place], [address] Phone: [phone]   Fax:  [phone]  Physical Therapy Treatment  Patient Details  Name: Jose [name]. MRN: [id number] Date of Birth: [birth year] Referring Provider: Dr. Mardelle Matte  Encounter Date: 03/04/2017      PT End of Session - 03/04/17 1656    Visit Number 12   Number of Visits 24   Date for PT Re-Evaluation 04/02/17   Authorization Type Medicare / Tricare - PT only   PT Start Time 1108   PT Stop Time 1209   PT Time Calculation (min) 61 min   Activity Tolerance Patient tolerated treatment well   Behavior During Therapy [facility] for tasks assessed/performed      Past Medical History:  Diagnosis Date  . Arthritis   . Dysrhythmia    afib  . Gallstones 07/1996  . Headache   . History of CT scan of head 1997   small vessel disease  . History of ETT 05/2002    no EKG changes  . History of MRI of cervical spine 07/15/2002  . Hypertension   . Lumbar spondylolysis 12/17/2016  . MGUS (monoclonal gammopathy of unknown significance) 04/11/2012  . Peripheral neuropathy   . Pneumonia 10/2014  . PONV (postoperative nausea and vomiting)    once many years ago  . Primary localized osteoarthrosis of left shoulder 12/25/2016  . Rectal bleeding 12/2002   colonoscopy  . Seizures ([place])    last seizure 1976  . Skin cancer   . Sleep apnea    ? to have study after surgery  . Stroke [facility])    ?tia ?afib  . TIA (transient ischemic attack)   . 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 ADENOIDECTOMY    . ANTERIOR CERVICAL DECOMP/DISCECTOMY FUSION N/A 02/04/2015   Procedure: ANTERIOR CERVICAL DECOMPRESSION/DISCECTOMY FUSION CERVICAL FIVE-SIX,CERVICAL SIX-SEVEN;  Surgeon: Karie Chimera, MD;  Location: [facility];  Service: Neurosurgery;  Laterality:  N/A;  . CATARACT EXTRACTION Bilateral   . COLONOSCOPY W/ POLYPECTOMY  04/19/2003   tubular adenoma  . GREAT TOE ARTHRODESIS, INTERPHALANGEAL JOINT Left 1978  . keratosis excision Right 05/1998   wrist  . nerve transfer surgery to lwft hand Left    Nov. 2017  . SHOULDER ARTHROSCOPY Left 13   rotator cuff   . SHOULDER INJECTION  June 2012   left, [place]  . SKIN CANCER EXCISION  07/1993   left lateral arm  . thumb injection  June 2012   left, [place]  . TONSILLECTOMY    . TOTAL SHOULDER ARTHROPLASTY Left 12/25/2016   Procedure: TOTAL SHOULDER ARTHROPLASTY;  Surgeon: Marchia Bond, MD;  Location: [facility];  Service: Orthopedics;  Laterality: Left;  . ulnar nerve transfer  12/1999   left    There were no vitals filed for this visit.      Subjective Assessment - 03/04/17 1645    Subjective no complaints today - continued soreness/tightness   Patient Stated Goals Improve L shoulder function   Currently in Pain? Yes   Pain Score 1    Pain Location Shoulder   Pain Orientation Left   Pain Descriptors / Indicators Sore   Pain Type Surgical pain  [facility] Adult PT Treatment/Exercise - 03/04/17 0001      Shoulder Exercises: Supine   Flexion AROM;Left;5 reps   Flexion Limitations near full ROM   ABduction AROM;Left;5 reps   ABduction Limitations patient tending to go into scaption - near full ROM     Shoulder Exercises: Seated   Row Strengthening;Both;15 reps;Theraband   Theraband Level (Shoulder Row) Level 3 (Green)   Row Limitations VC for scap squeeze   Flexion Strengthening;Left;15 reps;Weights   Flexion Weight (lbs) 1   Abduction Strengthening;Left;15 reps;Weights   ABduction Weight (lbs) 1     Shoulder Exercises: Standing   External Rotation Strengthening;Left;15 reps;Theraband   Theraband Level (Shoulder External Rotation) Level 1 (Yellow)   Internal Rotation Strengthening;Left;15 reps;Theraband   Theraband Level (Shoulder Internal  Rotation) Level 1 (Yellow)   Other Standing Exercises bicep curls 3# x 20     Shoulder Exercises: Pulleys   Flexion 3 minutes   ABduction 3 minutes     Shoulder Exercises: ROM/Strengthening   Ball on Wall orange medball - 2# at wrist x 10 reps      Modalities   Modalities Electrical Stimulation;Cryotherapy     Cryotherapy   Number Minutes Cryotherapy 15 Minutes   Cryotherapy Location Shoulder   Type of Cryotherapy Ice pack     Electrical Stimulation   Electrical Stimulation Location L shoulder   Electrical Stimulation Action IFC    Electrical Stimulation Parameters to tolerance   Electrical Stimulation Goals Pain     Manual Therapy   Manual Therapy Soft tissue mobilization;Passive ROM   Manual therapy comments patient supine with bolster under knees   Soft tissue mobilization STM to posterior shoulder complex   Passive ROM PROM into ER with 10-15 second holds - most limited range                  PT Short Term Goals - 02/07/17 1525      PT SHORT TERM GOAL #1   Title Patient to be independent with intial stretching/strengthening HEP (03/05/17)   Status On-going     PT SHORT TERM GOAL #2   Title Patient to improve L shoulder PROM: flexion 160, ER 75, and IR 80 - without pain limiting (03/05/17)   Status On-going     PT SHORT TERM GOAL #3   Title Patient to improve gross L shoulder strength to 3+/5 (03/05/17)   Status On-going           PT Long Term Goals - 02/07/17 1526      PT LONG TERM GOAL #1   Title Patient to be independent with advanced HEP for L shoulder strengthening (04/02/17)   Status On-going     PT LONG TERM GOAL #2   Title Patient to improve L shoulder AROM: flexion 160, ER 75, IR 80 wihtout pain limiting (04/02/17)   Status On-going     PT LONG TERM GOAL #3   Title Patient to demonstrate ability to perform ADLs and basic household chores without pain or functional limatations (04/02/17)   Status On-going     PT LONG TERM GOAL #4   Title  Patient to improve L shoulder strength to >/= 4+/5 (04/02/17)   Status On-going               Plan - 03/04/17 1656    Clinical Impression Statement Paitent doing very well today - good AROM in supine - near full flexion and abduction with slight scaption. ER ROM continues to be most  limited and painfull with passive stretching requiring continued work in this area. Making good progress towards goals otherwise.    PT Treatment/Interventions ADLs/Self Care Home Management;Cryotherapy;Electrical Stimulation;Moist Heat;Ultrasound;Therapeutic exercise;Therapeutic activities;Patient/family education;Manual techniques;Scar mobilization;Passive range of motion;Vasopneumatic Device;Taping;Dry needling;Neuromuscular re-education   PT Next Visit Plan continue vasopneumatic compression as indicated; estim as indicated for pain, progress per protocol.    Consulted and Agree with Plan of Care Patient      Patient will benefit from skilled therapeutic intervention in order to improve the following deficits and impairments:  Pain, Impaired UE functional use, Decreased strength, Decreased range of motion, Decreased activity tolerance, Decreased endurance  Visit Diagnosis: Acute pain of left shoulder  Stiffness of left shoulder, not elsewhere classified  Muscle weakness (generalized)  Abnormal posture     Problem List Patient Active Problem List   Diagnosis Date Noted  . Primary localized osteoarthrosis of left shoulder 12/25/2016  . S/P shoulder replacement 12/25/2016  . Lumbar spondylolysis 12/17/2016  . Paresthesia 01/24/2016  . 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 extremity 03/29/2015  . Cervical spinal stenosis 02/04/2015  . Bronchiectasis without acute exacerbation ([place]) 01/11/2015  . Ulnar neuropathy at elbow 12/24/2014  . TIA (transient ischemic attack) 12/15/2014  . Hyperlipidemia 11/24/2014  . Hypoxia   . New onset a-fib ([place])   . MGUS (monoclonal gammopathy of unknown  significance) 04/11/2012  . SEBORRHEIC KERATOSIS 11/09/2010  . CPK, ABNORMAL 09/03/2010  . DERMATOPHYTOSIS OF FOOT 08/03/2010  . MYALGIA 12/27/2009  . GOUT, UNSPECIFIED 10/04/2009  . HYPERTROPHY PROSTATE W/O UR OBST & OTH LUTS 12/14/2008  . ALLERGIC CONJUNCTIVITIS 12/03/2007  . TESTOSTERONE DEFICIENCY 11/14/2007  . Backache 04/22/2007  . HYPERLIPIDEMIA 12/26/2006  . OBESITY, NOS 12/26/2006  . 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 Hereditary and idiopathic peripheral neuropathy 12/26/2006  . HEARING LOSS NOS OR DEAFNESS 12/26/2006  . HYPERTENSION, BENIGN SYSTEMIC 12/26/2006  . OSTEOARTHRITIS, MULTI SITES 12/26/2006  . Musculoskeletal disorder and symptoms referable to neck 12/26/2006     Lanney Gins, PT, DPT 03/04/17 4:59 PM   [facility] [address]  [place], [place], [address] Phone: [phone]   Fax:  [phone]  Name: Jose [name]. MRN: [id number] Date of Birth: 07-Jan-1936

## 2017-03-06 ENCOUNTER — Ambulatory Visit: Payer: Medicare Other | Admitting: Physical Therapy

## 2017-03-06 DIAGNOSIS — M25612 Stiffness of left shoulder, not elsewhere classified: Secondary | ICD-10-CM

## 2017-03-06 DIAGNOSIS — M6281 Muscle weakness (generalized): Secondary | ICD-10-CM

## 2017-03-06 DIAGNOSIS — R293 Abnormal posture: Secondary | ICD-10-CM

## 2017-03-06 DIAGNOSIS — M25512 Pain in left shoulder: Secondary | ICD-10-CM

## 2017-03-06 NOTE — Therapy (Signed)
[facility] [address]  [place], [place], [address] Phone: [phone]   Fax:  [phone]  Physical Therapy Treatment  Patient Details  Name: Jose [name]. MRN: [id number] Date of Birth: 10-Jul-1936 Referring Provider: Dr. Mardelle Matte  Encounter Date: 03/06/2017      PT End of Session - 03/06/17 1110    Visit Number 13   Number of Visits 24   Date for PT Re-Evaluation 04/02/17   Authorization Type Medicare / Tricare - PT only   PT Start Time 1103   PT Stop Time 1201   PT Time Calculation (min) 58 min   Activity Tolerance Patient tolerated treatment well   Behavior During Therapy [facility] for tasks assessed/performed      Past Medical History:  Diagnosis Date  . Arthritis   . Dysrhythmia    afib  . Gallstones 07/1996  . Headache   . History of CT scan of head 1997   small vessel disease  . History of ETT 05/2002    no EKG changes  . History of MRI of cervical spine 07/15/2002  . Hypertension   . Lumbar spondylolysis 12/17/2016  . MGUS (monoclonal gammopathy of unknown significance) 04/11/2012  . Peripheral neuropathy   . Pneumonia 10/2014  . PONV (postoperative nausea and vomiting)    once many years ago  . Primary localized osteoarthrosis of left shoulder 12/25/2016  . Rectal bleeding 12/2002   colonoscopy  . Seizures ([place])    last seizure 1976  . Skin cancer   . Sleep apnea    ? to have study after surgery  . Stroke [facility])    ?tia ?afib  . TIA (transient ischemic attack)   . 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 ADENOIDECTOMY    . ANTERIOR CERVICAL DECOMP/DISCECTOMY FUSION N/A 02/04/2015   Procedure: ANTERIOR CERVICAL DECOMPRESSION/DISCECTOMY FUSION CERVICAL FIVE-SIX,CERVICAL SIX-SEVEN;  Surgeon: Karie Chimera, MD;  Location: [facility];  Service: Neurosurgery;  Laterality:  N/A;  . CATARACT EXTRACTION Bilateral   . COLONOSCOPY W/ POLYPECTOMY  04/19/2003   tubular adenoma  . GREAT TOE ARTHRODESIS, INTERPHALANGEAL JOINT Left 1978  . keratosis excision Right 05/1998   wrist  . nerve transfer surgery to lwft hand Left    Nov. 2017  . SHOULDER ARTHROSCOPY Left 13   rotator cuff   . SHOULDER INJECTION  June 2012   left, [place]  . SKIN CANCER EXCISION  07/1993   left lateral arm  . thumb injection  June 2012   left, [place]  . TONSILLECTOMY    . TOTAL SHOULDER ARTHROPLASTY Left 12/25/2016   Procedure: TOTAL SHOULDER ARTHROPLASTY;  Surgeon: Marchia Bond, MD;  Location: [facility];  Service: Orthopedics;  Laterality: Left;  . ulnar nerve transfer  12/1999   left    There were no vitals filed for this visit.      Subjective Assessment - 03/06/17 1108    Subjective Walked around [employer] yesterday for long periods - having a good bit of hip pain; no complaints at the shoulder   Patient Stated Goals Improve L shoulder function   Currently in Pain? Yes   Pain Score 2    Pain Location Shoulder   Pain Orientation Left   Pain Descriptors / Indicators Aching;Sore   Pain Type Surgical pain  [facility] Adult PT Treatment/Exercise - 03/06/17 1111      Shoulder Exercises: Seated   Row Strengthening;Both;15 reps;Theraband   Theraband Level (Shoulder Row) Level 3 (Green)   Row Limitations VC for scap squeeze and reduced shoulder hike   Other Seated Exercises AROM into flexion, PROM into further flexion, isometric hold and eccentric lowering x 12     Shoulder Exercises: Prone   Retraction Left;12 reps   Retraction Limitations modified over green pball   Extension Left;15 reps   Extension Limitations modified over green pball     Shoulder Exercises: Standing   External Rotation Strengthening;Left;15 reps;Theraband   Theraband Level (Shoulder External Rotation) Level 1 (Yellow)   Internal Rotation Strengthening;Left;15  reps;Theraband   Theraband Level (Shoulder Internal Rotation) Level 1 (Yellow)   ABduction AROM;15 reps;Left   ABduction Limitations slight scaption and use of mirror to reduce shoulder hike     Shoulder Exercises: Pulleys   Flexion 3 minutes   ABduction 3 minutes     Shoulder Exercises: ROM/Strengthening   Other ROM/Strengthening Exercises towel flexion slides x 15 - 2# at wrist     Modalities   Modalities Electrical Stimulation;Vasopneumatic     Electrical Stimulation   Electrical Stimulation Location L shoulder   Electrical Stimulation Action IFC   Electrical Stimulation Parameters to tolerance   Electrical Stimulation Goals Pain     Vasopneumatic   Number Minutes Vasopneumatic  15 minutes   Vasopnuematic Location  Shoulder   Vasopneumatic Pressure Medium   Vasopneumatic Temperature  coldest temp                  PT Short Term Goals - 03/06/17 1151      PT SHORT TERM GOAL #1   Title Patient to be independent with intial stretching/strengthening HEP (03/05/17)   Status Achieved     PT SHORT TERM GOAL #2   Title Patient to improve L shoulder PROM: flexion 160, ER 75, and IR 80 - without pain limiting (03/05/17)   Status On-going     PT SHORT TERM GOAL #3   Title Patient to improve gross L shoulder strength to 3+/5 (03/05/17)   Status On-going           PT Long Term Goals - 02/07/17 1526      PT LONG TERM GOAL #1   Title Patient to be independent with advanced HEP for L shoulder strengthening (04/02/17)   Status On-going     PT LONG TERM GOAL #2   Title Patient to improve L shoulder AROM: flexion 160, ER 75, IR 80 wihtout pain limiting (04/02/17)   Status On-going     PT LONG TERM GOAL #3   Title Patient to demonstrate ability to perform ADLs and basic household chores without pain or functional limatations (04/02/17)   Status On-going     PT LONG TERM GOAL #4   Title Patient to improve L shoulder strength to >/= 4+/5 (04/02/17)   Status On-going                Plan - 03/06/17 1111    Clinical Impression Statement Great improvements in general strengthening and ROM - greatest limitation conitnues to be in ER PROM/AROM as well as strengthening. Progressing ther ex today to include modified prone rows and I's with good tolerance and improved periscapular control. Mirror use today for external feedback during AROM abduction to prevent compensation of hiking with good carryover.    PT Treatment/Interventions ADLs/Self Care Home Management;Cryotherapy;Electrical Stimulation;Moist  Heat;Ultrasound;Therapeutic exercise;Therapeutic activities;Patient/family education;Manual techniques;Scar mobilization;Passive range of motion;Vasopneumatic Device;Taping;Dry needling;Neuromuscular re-education   PT Next Visit Plan continue vasopneumatic compression as indicated; estim as indicated for pain, progress per protocol.    Consulted and Agree with Plan of Care Patient      Patient will benefit from skilled therapeutic intervention in order to improve the following deficits and impairments:  Pain, Impaired UE functional use, Decreased strength, Decreased range of motion, Decreased activity tolerance, Decreased endurance  Visit Diagnosis: Acute pain of left shoulder  Stiffness of left shoulder, not elsewhere classified  Muscle weakness (generalized)  Abnormal posture     Problem List Patient Active Problem List   Diagnosis Date Noted  . Primary localized osteoarthrosis of left shoulder 12/25/2016  . S/P shoulder replacement 12/25/2016  . Lumbar spondylolysis 12/17/2016  . Paresthesia 01/24/2016  . 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 extremity 03/29/2015  . Cervical spinal stenosis 02/04/2015  . Bronchiectasis without acute exacerbation ([place]) 01/11/2015  . Ulnar neuropathy at elbow 12/24/2014  . TIA (transient ischemic attack) 12/15/2014  . Hyperlipidemia 11/24/2014  . Hypoxia   . New onset a-fib ([place])   . MGUS (monoclonal  gammopathy of unknown significance) 04/11/2012  . SEBORRHEIC KERATOSIS 11/09/2010  . CPK, ABNORMAL 09/03/2010  . DERMATOPHYTOSIS OF FOOT 08/03/2010  . MYALGIA 12/27/2009  . GOUT, UNSPECIFIED 10/04/2009  . HYPERTROPHY PROSTATE W/O UR OBST & OTH LUTS 12/14/2008  . ALLERGIC CONJUNCTIVITIS 12/03/2007  . TESTOSTERONE DEFICIENCY 11/14/2007  . Backache 04/22/2007  . HYPERLIPIDEMIA 12/26/2006  . OBESITY, NOS 12/26/2006  . 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 Hereditary and idiopathic peripheral neuropathy 12/26/2006  . HEARING LOSS NOS OR DEAFNESS 12/26/2006  . HYPERTENSION, BENIGN SYSTEMIC 12/26/2006  . OSTEOARTHRITIS, MULTI SITES 12/26/2006  . Musculoskeletal disorder and symptoms referable to neck 12/26/2006    Lanney Gins, PT, DPT 03/06/17 12:06 PM   [facility] [address]  [place], [place], [address] Phone: [phone]   Fax:  [phone]  Name: Destin Vinsant. MRN: [id number] Date of Birth: [birth year]

## 2017-03-08 ENCOUNTER — Ambulatory Visit: Payer: Medicare Other | Admitting: Physical Therapy

## 2017-03-08 DIAGNOSIS — M25612 Stiffness of left shoulder, not elsewhere classified: Secondary | ICD-10-CM

## 2017-03-08 DIAGNOSIS — M25512 Pain in left shoulder: Secondary | ICD-10-CM | POA: Diagnosis not present

## 2017-03-08 DIAGNOSIS — R293 Abnormal posture: Secondary | ICD-10-CM

## 2017-03-08 DIAGNOSIS — M6281 Muscle weakness (generalized): Secondary | ICD-10-CM

## 2017-03-08 NOTE — Therapy (Signed)
[facility] [address]  [place], [place], [address] Phone: [phone]   Fax:  [phone]  Physical Therapy Treatment  Patient Details  Name: Jose [name]. MRN: [id number] Date of Birth: May 06, 1936 Referring Provider: Dr. Mardelle Matte  Encounter Date: 03/08/2017      PT End of Session - 03/08/17 1100    Visit Number 14   Number of Visits 24   Date for PT Re-Evaluation 04/02/17   Authorization Type Medicare / Tricare - PT only   PT Start Time 1100   PT Stop Time 1210   PT Time Calculation (min) 70 min   Activity Tolerance Patient tolerated treatment well   Behavior During Therapy [facility] for tasks assessed/performed      Past Medical History:  Diagnosis Date  . Arthritis   . Dysrhythmia    afib  . Gallstones 07/1996  . Headache   . History of CT scan of head 1997   small vessel disease  . History of ETT 05/2002    no EKG changes  . History of MRI of cervical spine 07/15/2002  . Hypertension   . Lumbar spondylolysis 12/17/2016  . MGUS (monoclonal gammopathy of unknown significance) 04/11/2012  . Peripheral neuropathy   . Pneumonia 10/2014  . PONV (postoperative nausea and vomiting)    once many years ago  . Primary localized osteoarthrosis of left shoulder 12/25/2016  . Rectal bleeding 12/2002   colonoscopy  . Seizures ([place])    last seizure 1976  . Skin cancer   . Sleep apnea    ? to have study after surgery  . Stroke [facility])    ?tia ?afib  . TIA (transient ischemic attack)   . 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 ADENOIDECTOMY    . ANTERIOR CERVICAL DECOMP/DISCECTOMY FUSION N/A 02/04/2015   Procedure: ANTERIOR CERVICAL DECOMPRESSION/DISCECTOMY FUSION CERVICAL FIVE-SIX,CERVICAL SIX-SEVEN;  Surgeon: Karie Chimera, MD;  Location: [facility];  Service: Neurosurgery;  Laterality:  N/A;  . CATARACT EXTRACTION Bilateral   . COLONOSCOPY W/ POLYPECTOMY  04/19/2003   tubular adenoma  . GREAT TOE ARTHRODESIS, INTERPHALANGEAL JOINT Left 1978  . keratosis excision Right 05/1998   wrist  . nerve transfer surgery to lwft hand Left    Nov. 2017  . SHOULDER ARTHROSCOPY Left 13   rotator cuff   . SHOULDER INJECTION  June 2012   left, [place]  . SKIN CANCER EXCISION  07/1993   left lateral arm  . thumb injection  June 2012   left, [place]  . TONSILLECTOMY    . TOTAL SHOULDER ARTHROPLASTY Left 12/25/2016   Procedure: TOTAL SHOULDER ARTHROPLASTY;  Surgeon: Marchia Bond, MD;  Location: [place];  Service: Orthopedics;  Laterality: Left;  . ulnar nerve transfer  12/1999   left    There were no vitals filed for this visit.      Subjective Assessment - 03/08/17 1106    Subjective Pt feeling like shoulder is progressing well.   Patient Stated Goals Improve L shoulder function   Currently in Pain? Yes   Pain Score --  1-2/10   Pain Location Shoulder   Pain Orientation Left            [facility] PT Assessment - 03/08/17 1100      Assessment   Medical Diagnosis L TSA  Referring Provider Dr. Mardelle Matte   Onset Date/Surgical Date 12/25/16   Next MD Visit 03/11/17     AROM   Left Shoulder Flexion 120 Degrees  seated, 142 supine   Left Shoulder ABduction 70 Degrees  seated, 137 supine     PROM   Left Shoulder Flexion 146 Degrees   Left Shoulder ABduction 144 Degrees   Left Shoulder Internal Rotation 70 Degrees   Left Shoulder External Rotation 52 Degrees                     [facility] Adult PT Treatment/Exercise - 03/08/17 1100      Shoulder Exercises: Seated   Other Seated Exercises AROM into flexion, PROM into further flexion, isometric hold and eccentric lowering x 12     Shoulder Exercises: Sidelying   External Rotation Left;AROM;10 reps   External Rotation Limitations + scapular retraction   ABduction Left;AROM;10 reps   ABduction Limitations 30-90 dg      Shoulder Exercises: Pulleys   Flexion 3 minutes   ABduction 3 minutes     Modalities   Modalities Electrical Stimulation;Vasopneumatic     Electrical Stimulation   Electrical Stimulation Location L shoulder   Electrical Stimulation Action IFC   Electrical Stimulation Parameters to tolerance x15'   Electrical Stimulation Goals Pain     Vasopneumatic   Number Minutes Vasopneumatic  15 minutes   Vasopnuematic Location  Shoulder   Vasopneumatic Pressure Medium   Vasopneumatic Temperature  coldest temp     Manual Therapy   Manual Therapy Soft tissue mobilization;Joint mobilization;Passive ROM   Manual therapy comments patient supine with bolster under knees   Joint Mobilization L shoulder grade 2-3 A/P & inferior mobs   Soft tissue mobilization STM to L lats, teres major/minor, pecs & ant/mid/posterior deltoid   Myofascial Release TPR to L ant/mid deltoid, teres & infraspinatus   Passive ROM PROM all planes - emphasis with ER holds for improved ROM                  PT Short Term Goals - 03/08/17 1231      PT SHORT TERM GOAL #1   Title Patient to be independent with intial stretching/strengthening HEP (03/05/17)   Status Achieved     PT SHORT TERM GOAL #2   Title Patient to improve L shoulder PROM: flexion 160, ER 75, and IR 80 - without pain limiting (03/05/17)   Status On-going     PT SHORT TERM GOAL #3   Title Patient to improve gross L shoulder strength to 3+/5 (03/05/17)   Status On-going           PT Long Term Goals - 02/07/17 1526      PT LONG TERM GOAL #1   Title Patient to be independent with advanced HEP for L shoulder strengthening (04/02/17)   Status On-going     PT LONG TERM GOAL #2   Title Patient to improve L shoulder AROM: flexion 160, ER 75, IR 80 wihtout pain limiting (04/02/17)   Status On-going     PT LONG TERM GOAL #3   Title Patient to demonstrate ability to perform ADLs and basic household chores without pain or functional limatations  (04/02/17)   Status On-going     PT LONG TERM GOAL #4   Title Patient to improve L shoulder strength to >/= 4+/5 (04/02/17)   Status On-going               Plan -  03/08/17 1110    Clinical Impression Statement Jose [name] demonstrating steady improvement in AROM and PROM with greatest limitation remaining in ER. Some persistent tightness present in posterior shoulder complex as well as deltoid muscularture, although less frequent triiger points. Continued cueing needed to minimize substitution for some motions, but able to tolerate basic strengthening without increased pain. Pt continues to have good potential to benefit from skilled PT to maximize L shoulder ROM & strength for improved functional use of L shouler with daily activities.   Rehab Potential Good   PT Treatment/Interventions ADLs/Self Care Home Management;Cryotherapy;Electrical Stimulation;Moist Heat;Ultrasound;Therapeutic exercise;Therapeutic activities;Patient/family education;Manual techniques;Scar mobilization;Passive range of motion;Vasopneumatic Device;Taping;Dry needling;Neuromuscular re-education   PT Next Visit Plan progress per TSA protocol; continue vasopneumatic compression as indicated; estim as indicated for pain   Consulted and Agree with Plan of Care Patient      Patient will benefit from skilled therapeutic intervention in order to improve the following deficits and impairments:  Pain, Impaired UE functional use, Decreased strength, Decreased range of motion, Decreased activity tolerance, Decreased endurance  Visit Diagnosis: Acute pain of left shoulder  Stiffness of left shoulder, not elsewhere classified  Muscle weakness (generalized)  Abnormal posture     Problem List Patient Active Problem List   Diagnosis Date Noted  . Primary localized osteoarthrosis of left shoulder 12/25/2016  . S/P shoulder replacement 12/25/2016  . Lumbar spondylolysis 12/17/2016  . Paresthesia 01/24/2016  . Ulnar  neuropathy at elbow of left upper extremity 03/29/2015  . Cervical spinal stenosis 02/04/2015  . Bronchiectasis without acute exacerbation ([place]) 01/11/2015  . Ulnar neuropathy at elbow 12/24/2014  . TIA (transient ischemic attack) 12/15/2014  . Hyperlipidemia 11/24/2014  . Hypoxia   . New onset a-fib ([place])   . MGUS (monoclonal gammopathy of unknown significance) 04/11/2012  . SEBORRHEIC KERATOSIS 11/09/2010  . CPK, ABNORMAL 09/03/2010  . DERMATOPHYTOSIS OF FOOT 08/03/2010  . MYALGIA 12/27/2009  . GOUT, UNSPECIFIED 10/04/2009  . HYPERTROPHY PROSTATE W/O UR OBST & OTH LUTS 12/14/2008  . ALLERGIC CONJUNCTIVITIS 12/03/2007  . TESTOSTERONE DEFICIENCY 11/14/2007  . Backache 04/22/2007  . HYPERLIPIDEMIA 12/26/2006  . OBESITY, NOS 12/26/2006  . 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 Hereditary and idiopathic peripheral neuropathy 12/26/2006  . HEARING LOSS NOS OR DEAFNESS 12/26/2006  . HYPERTENSION, BENIGN SYSTEMIC 12/26/2006  . OSTEOARTHRITIS, MULTI SITES 12/26/2006  . Musculoskeletal disorder and symptoms referable to neck 12/26/2006    Percival Spanish, PT, MPT 03/08/2017, 12:34 PM  [facility] [address]  [place], [place], [address] Phone: [phone]   Fax:  [phone]  Name: Jose [name]. MRN: [id number] Date of Birth: 08/12/1936

## 2017-03-11 ENCOUNTER — Ambulatory Visit: Payer: Medicare Other | Admitting: Physical Therapy

## 2017-03-11 DIAGNOSIS — R293 Abnormal posture: Secondary | ICD-10-CM

## 2017-03-11 DIAGNOSIS — M25612 Stiffness of left shoulder, not elsewhere classified: Secondary | ICD-10-CM

## 2017-03-11 DIAGNOSIS — M6281 Muscle weakness (generalized): Secondary | ICD-10-CM

## 2017-03-11 DIAGNOSIS — M25512 Pain in left shoulder: Secondary | ICD-10-CM | POA: Diagnosis not present

## 2017-03-11 NOTE — Therapy (Signed)
[facility] [address]  [place], [place], [address] Phone: [phone]   Fax:  [phone]  Physical Therapy Treatment  Patient Details  Name: Jose [name]. MRN: [id number] Date of Birth: 01/24/1936 Referring Provider: Dr. Mardelle Matte  Encounter Date: 03/11/2017      PT End of Session - 03/11/17 1446    Visit Number 15   Number of Visits 24   Date for PT Re-Evaluation 04/02/17   Authorization Type Medicare / Tricare - PT only   PT Start Time 1446   PT Stop Time 1548   PT Time Calculation (min) 62 min   Activity Tolerance Patient tolerated treatment well   Behavior During Therapy [facility] for tasks assessed/performed      Past Medical History:  Diagnosis Date  . Arthritis   . Dysrhythmia    afib  . Gallstones 07/1996  . Headache   . History of CT scan of head 1997   small vessel disease  . History of ETT 05/2002    no EKG changes  . History of MRI of cervical spine 07/15/2002  . Hypertension   . Lumbar spondylolysis 12/17/2016  . MGUS (monoclonal gammopathy of unknown significance) 04/11/2012  . Peripheral neuropathy   . Pneumonia 10/2014  . PONV (postoperative nausea and vomiting)    once many years ago  . Primary localized osteoarthrosis of left shoulder 12/25/2016  . Rectal bleeding 12/2002   colonoscopy  . Seizures ([place])    last seizure 1976  . Skin cancer   . Sleep apnea    ? to have study after surgery  . Stroke [facility])    ?tia ?afib  . TIA (transient ischemic attack)   . 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 ADENOIDECTOMY    . ANTERIOR CERVICAL DECOMP/DISCECTOMY FUSION N/A 02/04/2015   Procedure: ANTERIOR CERVICAL DECOMPRESSION/DISCECTOMY FUSION CERVICAL FIVE-SIX,CERVICAL SIX-SEVEN;  Surgeon: Karie Chimera, MD;  Location: [place] [facility];  Service: Neurosurgery;  Laterality:  N/A;  . CATARACT EXTRACTION Bilateral   . COLONOSCOPY W/ POLYPECTOMY  04/19/2003   tubular adenoma  . GREAT TOE ARTHRODESIS, INTERPHALANGEAL JOINT Left 1978  . keratosis excision Right 05/1998   wrist  . nerve transfer surgery to lwft hand Left    Nov. 2017  . SHOULDER ARTHROSCOPY Left 13   rotator cuff   . SHOULDER INJECTION  June 2012   left, [place]  . SKIN CANCER EXCISION  07/1993   left lateral arm  . thumb injection  June 2012   left, [place]  . TONSILLECTOMY    . TOTAL SHOULDER ARTHROPLASTY Left 12/25/2016   Procedure: TOTAL SHOULDER ARTHROPLASTY;  Surgeon: Marchia Bond, MD;  Location: [place];  Service: Orthopedics;  Laterality: Left;  . ulnar nerve transfer  12/1999   left    There were no vitals filed for this visit.      Subjective Assessment - 03/11/17 1449    Subjective Pt saw MD this morning and states he is very pleased with progress. Pt feels that STM last time seems to have loosened things up.   Patient Stated Goals Improve L shoulder function   Currently in Pain? No/denies            [facility] PT Assessment - 03/11/17 1446      Assessment   Next MD Visit 04/08/17  [facility] Adult PT Treatment/Exercise - 03/11/17 1446      Shoulder Exercises: Standing   External Rotation Strengthening;Left;15 reps;Theraband   Theraband Level (Shoulder External Rotation) Level 2 (Red)   Internal Rotation Strengthening;Left;15 reps;Theraband   Theraband Level (Shoulder Internal Rotation) Level 2 (Red)     Shoulder Exercises: Pulleys   Flexion 3 minutes   ABduction 3 minutes     Shoulder Exercises: Therapy Ball   Flexion 10 reps   Flexion Limitations + L hand lift-off ball at end range flexion   ABduction 10 reps   ABduction Limitations + L hand lift-off ball at end range scaption     Shoulder Exercises: ROM/Strengthening   [place] L shoulder overhead arc (~45 dg to each side) x10     Modalities   Modalities Electrical  Stimulation;Vasopneumatic     Electrical Stimulation   Electrical Stimulation Location L shoulder   Electrical Stimulation Action IFC   Electrical Stimulation Parameters to tolerance x15'   Electrical Stimulation Goals Pain     Vasopneumatic   Number Minutes Vasopneumatic  15 minutes   Vasopnuematic Location  Shoulder   Vasopneumatic Pressure Medium   Vasopneumatic Temperature  coldest temp     Manual Therapy   Manual Therapy Soft tissue mobilization;Joint mobilization;Passive ROM   Manual therapy comments patient supine with bolster under knees   Joint Mobilization L shoulder grade 2-3 A/P & inferior mobs   Soft tissue mobilization STM to L lats, teres major/minor & pecs   Myofascial Release TPR to pecs, teres & infraspinatus   Passive ROM PROM all planes after STM/MFR - emphasis with ER holds for improved ROM                  PT Short Term Goals - 03/08/17 1231      PT SHORT TERM GOAL #1   Title Patient to be independent with intial stretching/strengthening HEP (03/05/17)   Status Achieved     PT SHORT TERM GOAL #2   Title Patient to improve L shoulder PROM: flexion 160, ER 75, and IR 80 - without pain limiting (03/05/17)   Status On-going     PT SHORT TERM GOAL #3   Title Patient to improve gross L shoulder strength to 3+/5 (03/05/17)   Status On-going           PT Long Term Goals - 02/07/17 1526      PT LONG TERM GOAL #1   Title Patient to be independent with advanced HEP for L shoulder strengthening (04/02/17)   Status On-going     PT LONG TERM GOAL #2   Title Patient to improve L shoulder AROM: flexion 160, ER 75, IR 80 wihtout pain limiting (04/02/17)   Status On-going     PT LONG TERM GOAL #3   Title Patient to demonstrate ability to perform ADLs and basic household chores without pain or functional limatations (04/02/17)   Status On-going     PT LONG TERM GOAL #4   Title Patient to improve L shoulder strength to >/= 4+/5 (04/02/17)   Status On-going                Plan - 03/11/17 1451    Clinical Impression Statement Pt reporting MD pleased with progress and available ROM at present. Continued work toward improving overhead ROM and strength today, with fatigue noted but no increased pain.   Rehab Potential Good   PT Treatment/Interventions ADLs/Self Care Home Management;Cryotherapy;Electrical Stimulation;Moist Heat;Ultrasound;Therapeutic exercise;Therapeutic activities;Patient/family education;Manual techniques;Scar  mobilization;Passive range of motion;Vasopneumatic Device;Taping;Dry needling;Neuromuscular re-education   PT Next Visit Plan progress per TSA protocol; continue vasopneumatic compression as indicated; estim as indicated for pain   Consulted and Agree with Plan of Care Patient      Patient will benefit from skilled therapeutic intervention in order to improve the following deficits and impairments:  Pain, Impaired UE functional use, Decreased strength, Decreased range of motion, Decreased activity tolerance, Decreased endurance  Visit Diagnosis: Acute pain of left shoulder  Stiffness of left shoulder, not elsewhere classified  Muscle weakness (generalized)  Abnormal posture     Problem List Patient Active Problem List   Diagnosis Date Noted  . Primary localized osteoarthrosis of left shoulder 12/25/2016  . S/P shoulder replacement 12/25/2016  . Lumbar spondylolysis 12/17/2016  . Paresthesia 01/24/2016  . 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 extremity 03/29/2015  . Cervical spinal stenosis 02/04/2015  . Bronchiectasis without acute exacerbation ([place]) 01/11/2015  . Ulnar neuropathy at elbow 12/24/2014  . TIA (transient ischemic attack) 12/15/2014  . Hyperlipidemia 11/24/2014  . Hypoxia   . New onset a-fib ([place])   . MGUS (monoclonal gammopathy of unknown significance) 04/11/2012  . SEBORRHEIC KERATOSIS 11/09/2010  . CPK, ABNORMAL 09/03/2010  . DERMATOPHYTOSIS OF FOOT 08/03/2010  . MYALGIA 12/27/2009   . GOUT, UNSPECIFIED 10/04/2009  . HYPERTROPHY PROSTATE W/O UR OBST & OTH LUTS 12/14/2008  . ALLERGIC CONJUNCTIVITIS 12/03/2007  . TESTOSTERONE DEFICIENCY 11/14/2007  . Backache 04/22/2007  . HYPERLIPIDEMIA 12/26/2006  . OBESITY, NOS 12/26/2006  . 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 Hereditary and idiopathic peripheral neuropathy 12/26/2006  . HEARING LOSS NOS OR DEAFNESS 12/26/2006  . HYPERTENSION, BENIGN SYSTEMIC 12/26/2006  . OSTEOARTHRITIS, MULTI SITES 12/26/2006  . Musculoskeletal disorder and symptoms referable to neck 12/26/2006    Percival Spanish, PT, MPT 03/11/2017, 5:51 PM  [facility] [address]  [place], [place], [address] Phone: [phone]   Fax:  [phone]  Name: Jose [name]. MRN: [id number] Date of Birth: [birth year]

## 2017-03-13 ENCOUNTER — Ambulatory Visit: Payer: Medicare Other | Admitting: Physical Therapy

## 2017-03-13 DIAGNOSIS — M25612 Stiffness of left shoulder, not elsewhere classified: Secondary | ICD-10-CM

## 2017-03-13 DIAGNOSIS — M6281 Muscle weakness (generalized): Secondary | ICD-10-CM

## 2017-03-13 DIAGNOSIS — R293 Abnormal posture: Secondary | ICD-10-CM

## 2017-03-13 DIAGNOSIS — M25512 Pain in left shoulder: Secondary | ICD-10-CM | POA: Diagnosis not present

## 2017-03-13 NOTE — Therapy (Signed)
[facility] [address]  [place], [place], [address] Phone: [phone]   Fax:  [phone]  Physical Therapy Treatment  Patient Details  Name: Jose [name]. MRN: [id number] Date of Birth: 1936-07-31 Referring Provider: Dr. Mardelle Matte  Encounter Date: 03/13/2017      PT End of Session - 03/13/17 1454    Visit Number 16   Number of Visits 24   Date for PT Re-Evaluation 04/02/17   Authorization Type Medicare / Tricare - PT only   PT Start Time 1446   PT Stop Time 1527   PT Time Calculation (min) 41 min   Activity Tolerance Patient tolerated treatment well   Behavior During Therapy Jose [name] for tasks assessed/performed      Past Medical History:  Diagnosis Date  . Arthritis   . Dysrhythmia    afib  . Gallstones 07/1996  . Headache   . History of CT scan of head 1997   small vessel disease  . History of ETT 05/2002    no EKG changes  . History of MRI of cervical spine 07/15/2002  . Hypertension   . Lumbar spondylolysis 12/17/2016  . MGUS (monoclonal gammopathy of unknown significance) 04/11/2012  . Peripheral neuropathy   . Pneumonia 10/2014  . PONV (postoperative nausea and vomiting)    once many years ago  . Primary localized osteoarthrosis of left shoulder 12/25/2016  . Rectal bleeding 12/2002   colonoscopy  . Seizures ([place])    last seizure 1976  . Skin cancer   . Sleep apnea    ? to have study after surgery  . Stroke [facility])    ?tia ?afib  . TIA (transient ischemic attack)   . 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 ADENOIDECTOMY    . ANTERIOR CERVICAL DECOMP/DISCECTOMY FUSION N/A 02/04/2015   Procedure: ANTERIOR CERVICAL DECOMPRESSION/DISCECTOMY FUSION CERVICAL FIVE-SIX,CERVICAL SIX-SEVEN;  Surgeon: Karie Chimera, MD;  Location: [place] [facility];  Service: Neurosurgery;  Laterality:  N/A;  . CATARACT EXTRACTION Bilateral   . COLONOSCOPY W/ POLYPECTOMY  04/19/2003   tubular adenoma  . GREAT TOE ARTHRODESIS, INTERPHALANGEAL JOINT Left 1978  . keratosis excision Right 05/1998   wrist  . nerve transfer surgery to lwft hand Left    Nov. 2017  . SHOULDER ARTHROSCOPY Left 13   rotator cuff   . SHOULDER INJECTION  June 2012   left, [place]  . SKIN CANCER EXCISION  07/1993   left lateral arm  . thumb injection  June 2012   left, [place]  . TONSILLECTOMY    . TOTAL SHOULDER ARTHROPLASTY Left 12/25/2016   Procedure: TOTAL SHOULDER ARTHROPLASTY;  Surgeon: Marchia Bond, MD;  Location: [place];  Service: Orthopedics;  Laterality: Left;  . ulnar nerve transfer  12/1999   left    There were no vitals filed for this visit.      Subjective Assessment - 03/13/17 1451    Subjective Feeling well. no complaints.    Patient Stated Goals Improve L shoulder function   Currently in Pain? No/denies   Pain Score 0-No pain                         [facility] Adult PT Treatment/Exercise - 03/13/17 1454      Shoulder Exercises: Seated   Flexion Strengthening;Left;15 reps;Weights  Flexion Weight (lbs) 2   Abduction Strengthening;Left;15 reps;Weights   ABduction Weight (lbs) 1     Shoulder Exercises: Standing   External Rotation Strengthening;Left;15 reps;Theraband   Theraband Level (Shoulder External Rotation) Level 2 (Red)   Internal Rotation Strengthening;Left;15 reps;Theraband   Theraband Level (Shoulder Internal Rotation) Level 2 (Red)   Extension Strengthening;Both;15 reps;Theraband   Theraband Level (Shoulder Extension) Level 3 (Green)   Extension Limitations with scap squeeze   Row Strengthening;Both;15 reps;Theraband   Theraband Level (Shoulder Row) Level 3 (Green)   Row Limitations with scap squeeze     Shoulder Exercises: Pulleys   Flexion 3 minutes   ABduction 3 minutes     Shoulder Exercises: Therapy Ball   Flexion 10 reps   Flexion Limitations + L  hand lift-off ball at end range flexion; 1# at wrist     Shoulder Exercises: ROM/Strengthening   Rhythmic Stabilization, Supine Red med ball: CW/CCW circles 2 x 20; ABCs x 1     Manual Therapy   Manual Therapy Soft tissue mobilization;Myofascial release   Manual therapy comments patient supine with bolster under knees   Soft tissue mobilization STM to L lats, teres major/minor & pecs   Myofascial Release manual trigger point release to L lats, pecs, teres group and infraspinatus                  PT Short Term Goals - 03/08/17 1231      PT SHORT TERM GOAL #1   Title Patient to be independent with intial stretching/strengthening HEP (03/05/17)   Status Achieved     PT SHORT TERM GOAL #2   Title Patient to improve L shoulder PROM: flexion 160, ER 75, and IR 80 - without pain limiting (03/05/17)   Status On-going     PT SHORT TERM GOAL #3   Title Patient to improve gross L shoulder strength to 3+/5 (03/05/17)   Status On-going           PT Long Term Goals - 02/07/17 1526      PT LONG TERM GOAL #1   Title Patient to be independent with advanced HEP for L shoulder strengthening (04/02/17)   Status On-going     PT LONG TERM GOAL #2   Title Patient to improve L shoulder AROM: flexion 160, ER 75, IR 80 wihtout pain limiting (04/02/17)   Status On-going     PT LONG TERM GOAL #3   Title Patient to demonstrate ability to perform ADLs and basic household chores without pain or functional limatations (04/02/17)   Status On-going     PT LONG TERM GOAL #4   Title Patient to improve L shoulder strength to >/= 4+/5 (04/02/17)   Status On-going               Plan - 03/13/17 1454    Clinical Impression Statement Jose [name] doing well today - able to progress strengthening well, as well as incorporate more scapular stabilization without issue. Patient continues to have primary limitations of fatigue that will hopefully resolve in coming visits as we are progressing more into the  strengthening phase.    PT Treatment/Interventions ADLs/Self Care Home Management;Cryotherapy;Electrical Stimulation;Moist Heat;Ultrasound;Therapeutic exercise;Therapeutic activities;Patient/family education;Manual techniques;Scar mobilization;Passive range of motion;Vasopneumatic Device;Taping;Dry needling;Neuromuscular re-education   PT Next Visit Plan progress per TSA protocol; continue vasopneumatic compression as indicated; estim as indicated for pain   Consulted and Agree with Plan of Care Patient      Patient will benefit from skilled therapeutic intervention  in order to improve the following deficits and impairments:  Pain, Impaired UE functional use, Decreased strength, Decreased range of motion, Decreased activity tolerance, Decreased endurance  Visit Diagnosis: Acute pain of left shoulder  Stiffness of left shoulder, not elsewhere classified  Muscle weakness (generalized)  Abnormal posture     Problem List Patient Active Problem List   Diagnosis Date Noted  . Primary localized osteoarthrosis of left shoulder 12/25/2016  . S/P shoulder replacement 12/25/2016  . Lumbar spondylolysis 12/17/2016  . Paresthesia 01/24/2016  . 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 extremity 03/29/2015  . Cervical spinal stenosis 02/04/2015  . Bronchiectasis without acute exacerbation ([place]) 01/11/2015  . Ulnar neuropathy at elbow 12/24/2014  . TIA (transient ischemic attack) 12/15/2014  . Hyperlipidemia 11/24/2014  . Hypoxia   . New onset a-fib ([place])   . MGUS (monoclonal gammopathy of unknown significance) 04/11/2012  . SEBORRHEIC KERATOSIS 11/09/2010  . CPK, ABNORMAL 09/03/2010  . DERMATOPHYTOSIS OF FOOT 08/03/2010  . MYALGIA 12/27/2009  . GOUT, UNSPECIFIED 10/04/2009  . HYPERTROPHY PROSTATE W/O UR OBST & OTH LUTS 12/14/2008  . ALLERGIC CONJUNCTIVITIS 12/03/2007  . TESTOSTERONE DEFICIENCY 11/14/2007  . Backache 04/22/2007  . HYPERLIPIDEMIA 12/26/2006  . OBESITY, NOS 12/26/2006  .  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 Hereditary and idiopathic peripheral neuropathy 12/26/2006  . HEARING LOSS NOS OR DEAFNESS 12/26/2006  . HYPERTENSION, BENIGN SYSTEMIC 12/26/2006  . OSTEOARTHRITIS, MULTI SITES 12/26/2006  . Musculoskeletal disorder and symptoms referable to neck 12/26/2006      Lanney Gins, PT, DPT 03/13/17 3:56 PM   [facility] [address]  [place], [place], [address] Phone: [phone]   Fax:  [phone]  Name: Jose [name]. MRN: [id number] Date of Birth: 08/07/1936

## 2017-03-15 ENCOUNTER — Ambulatory Visit: Payer: Medicare Other | Admitting: Physical Therapy

## 2017-03-15 DIAGNOSIS — M25512 Pain in left shoulder: Secondary | ICD-10-CM | POA: Diagnosis not present

## 2017-03-15 DIAGNOSIS — M6281 Muscle weakness (generalized): Secondary | ICD-10-CM

## 2017-03-15 DIAGNOSIS — M25612 Stiffness of left shoulder, not elsewhere classified: Secondary | ICD-10-CM

## 2017-03-15 DIAGNOSIS — R293 Abnormal posture: Secondary | ICD-10-CM

## 2017-03-15 NOTE — Therapy (Signed)
[facility] [address]  [place], [place], [address] Phone: [phone]   Fax:  [phone]  Physical Therapy Treatment  Patient Details  Name: Jose [name]. MRN: [id number] Date of Birth: July 29, 1936 Referring Provider: Dr. Mardelle Matte  Encounter Date: 03/15/2017      PT End of Session - 03/15/17 1101    Visit Number 17   Number of Visits 24   Date for PT Re-Evaluation 04/02/17   Authorization Type Medicare / Tricare - PT only   PT Start Time 1101   PT Stop Time 1205   PT Time Calculation (min) 64 min   Activity Tolerance Patient tolerated treatment well   Behavior During Therapy [facility] for tasks assessed/performed      Past Medical History:  Diagnosis Date  . Arthritis   . Dysrhythmia    afib  . Gallstones 07/1996  . Headache   . History of CT scan of head 1997   small vessel disease  . History of ETT 05/2002    no EKG changes  . History of MRI of cervical spine 07/15/2002  . Hypertension   . Lumbar spondylolysis 12/17/2016  . MGUS (monoclonal gammopathy of unknown significance) 04/11/2012  . Peripheral neuropathy   . Pneumonia 10/2014  . PONV (postoperative nausea and vomiting)    once many years ago  . Primary localized osteoarthrosis of left shoulder 12/25/2016  . Rectal bleeding 12/2002   colonoscopy  . Seizures ([place])    last seizure 1976  . Skin cancer   . Sleep apnea    ? to have study after surgery  . Stroke [facility])    ?tia ?afib  . TIA (transient ischemic attack)   . 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 ADENOIDECTOMY    . ANTERIOR CERVICAL DECOMP/DISCECTOMY FUSION N/A 02/04/2015   Procedure: ANTERIOR CERVICAL DECOMPRESSION/DISCECTOMY FUSION CERVICAL FIVE-SIX,CERVICAL SIX-SEVEN;  Surgeon: Karie Chimera, MD;  Location: [place] [facility];  Service: Neurosurgery;  Laterality:  N/A;  . CATARACT EXTRACTION Bilateral   . COLONOSCOPY W/ POLYPECTOMY  04/19/2003   tubular adenoma  . GREAT TOE ARTHRODESIS, INTERPHALANGEAL JOINT Left 1978  . keratosis excision Right 05/1998   wrist  . nerve transfer surgery to lwft hand Left    Nov. 2017  . SHOULDER ARTHROSCOPY Left 13   rotator cuff   . SHOULDER INJECTION  June 2012   left, [place]  . SKIN CANCER EXCISION  07/1993   left lateral arm  . thumb injection  June 2012   left, [place]  . TONSILLECTOMY    . TOTAL SHOULDER ARTHROPLASTY Left 12/25/2016   Procedure: TOTAL SHOULDER ARTHROPLASTY;  Surgeon: Marchia Bond, MD;  Location: [place];  Service: Orthopedics;  Laterality: Left;  . ulnar nerve transfer  12/1999   left    There were no vitals filed for this visit.      Subjective Assessment - 03/15/17 1104    Subjective Pt noting increased pain, muscle tension & popping in lateral shoulder with attempts to use the L arm yesterday. Better this morning.   Patient Stated Goals Improve L shoulder function   Currently in Pain? No/denies   Pain Score 0-No pain                         [facility] Adult PT  Treatment/Exercise - 03/15/17 1101      Shoulder Exercises: Supine   Protraction Left;10 reps  2 sets   Protraction Limitations red med ball     Shoulder Exercises: Standing   Flexion Left;15 reps   Flexion Limitations standing against doorframe to promote scap retraction - pt attempting to initiate motion with shoulder hike, therefore deferred wts and focused on proper shoulder mechanics   Extension Strengthening;Left;20 reps;Theraband   Theraband Level (Shoulder Extension) Level 3 (Green)   Extension Limitations with scap squeeze   Row Strengthening;Left;20 reps;Theraband   Theraband Level (Shoulder Row) Level 3 (Green)   Row Limitations with scap squeeze; initiated B but released green TB on R and just continued with scap squeeze d/t increased R shoulder pain     Shoulder Exercises: Pulleys   Flexion  3 minutes   ABduction 3 minutes     Shoulder Exercises: ROM/Strengthening   UBE (Upper Arm Bike) lvl 1.5 fwd/back x 2' each - L arm primarily driving machine   Rhythmic Stabilization, Supine Red med ball: CW/CCW circles 2 x 20; ABCs x 2; rapid alternating isometric at 90 dg flexion all directions 2x30"     Modalities   Modalities Vasopneumatic     Vasopneumatic   Number Minutes Vasopneumatic  15 minutes   Vasopnuematic Location  Shoulder   Vasopneumatic Pressure Medium   Vasopneumatic Temperature  coldest temp     Manual Therapy   Manual Therapy Soft tissue mobilization;Myofascial release   Manual therapy comments patient supine with bolster under knees   Soft tissue mobilization STM to L lats, teres major/minor, pecs & ant/mid/posterior deltoid   Myofascial Release TPR to L mid deltoid, teres group & infraspinatus                  PT Short Term Goals - 03/08/17 1231      PT SHORT TERM GOAL #1   Title Patient to be independent with intial stretching/strengthening HEP (03/05/17)   Status Achieved     PT SHORT TERM GOAL #2   Title Patient to improve L shoulder PROM: flexion 160, ER 75, and IR 80 - without pain limiting (03/05/17)   Status On-going     PT SHORT TERM GOAL #3   Title Patient to improve gross L shoulder strength to 3+/5 (03/05/17)   Status On-going           PT Long Term Goals - 02/07/17 1526      PT LONG TERM GOAL #1   Title Patient to be independent with advanced HEP for L shoulder strengthening (04/02/17)   Status On-going     PT LONG TERM GOAL #2   Title Patient to improve L shoulder AROM: flexion 160, ER 75, IR 80 wihtout pain limiting (04/02/17)   Status On-going     PT LONG TERM GOAL #3   Title Patient to demonstrate ability to perform ADLs and basic household chores without pain or functional limatations (04/02/17)   Status On-going     PT LONG TERM GOAL #4   Title Patient to improve L shoulder strength to >/= 4+/5 (04/02/17)   Status On-going                Plan - 03/15/17 1106    Clinical Impression Statement Pt noting increased lateral L shoudler pain with use of L arm yesterday. Significantly taut band + TP identified in middle deltoid with pt demostrating increased tendency for shoulder hike to initiate overhead motion. Addressed increased muscle  tension in deltoid as well as posterior shoulder complex with some relief noted along with improving shoulder ER. Reinforced proper shoulder elevation mechanics emphasizing scapular retraction & depression prior to initiation of shoulder elevation.   Rehab Potential Good   PT Treatment/Interventions ADLs/Self Care Home Management;Cryotherapy;Electrical Stimulation;Moist Heat;Ultrasound;Therapeutic exercise;Therapeutic activities;Patient/family education;Manual techniques;Scar mobilization;Passive range of motion;Vasopneumatic Device;Taping;Dry needling;Neuromuscular re-education   PT Next Visit Plan progress per TSA protocol; continue vasopneumatic compression as indicated; estim as indicated for pain   Consulted and Agree with Plan of Care Patient      Patient will benefit from skilled therapeutic intervention in order to improve the following deficits and impairments:  Pain, Impaired UE functional use, Decreased strength, Decreased range of motion, Decreased activity tolerance, Decreased endurance  Visit Diagnosis: Acute pain of left shoulder  Stiffness of left shoulder, not elsewhere classified  Muscle weakness (generalized)  Abnormal posture     Problem List Patient Active Problem List   Diagnosis Date Noted  . Primary localized osteoarthrosis of left shoulder 12/25/2016  . S/P shoulder replacement 12/25/2016  . Lumbar spondylolysis 12/17/2016  . Paresthesia 01/24/2016  . 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 extremity 03/29/2015  . Cervical spinal stenosis 02/04/2015  . Bronchiectasis without acute exacerbation ([place]) 01/11/2015  . Ulnar neuropathy at elbow  12/24/2014  . TIA (transient ischemic attack) 12/15/2014  . Hyperlipidemia 11/24/2014  . Hypoxia   . New onset a-fib ([place])   . MGUS (monoclonal gammopathy of unknown significance) 04/11/2012  . SEBORRHEIC KERATOSIS 11/09/2010  . CPK, ABNORMAL 09/03/2010  . DERMATOPHYTOSIS OF FOOT 08/03/2010  . MYALGIA 12/27/2009  . GOUT, UNSPECIFIED 10/04/2009  . HYPERTROPHY PROSTATE W/O UR OBST & OTH LUTS 12/14/2008  . ALLERGIC CONJUNCTIVITIS 12/03/2007  . TESTOSTERONE DEFICIENCY 11/14/2007  . Backache 04/22/2007  . HYPERLIPIDEMIA 12/26/2006  . OBESITY, NOS 12/26/2006  . 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 Hereditary and idiopathic peripheral neuropathy 12/26/2006  . HEARING LOSS NOS OR DEAFNESS 12/26/2006  . HYPERTENSION, BENIGN SYSTEMIC 12/26/2006  . OSTEOARTHRITIS, MULTI SITES 12/26/2006  . Musculoskeletal disorder and symptoms referable to neck 12/26/2006    Percival Spanish, PT, MPT 03/15/2017, 12:10 PM  [facility] [address]  [place], [place], [address] Phone: [phone]   Fax:  [phone]  Name: Jose [name]. MRN: [id number] Date of Birth: 05-21-1936

## 2017-03-20 ENCOUNTER — Ambulatory Visit: Payer: Medicare Other | Admitting: Physical Therapy

## 2017-03-20 DIAGNOSIS — M25612 Stiffness of left shoulder, not elsewhere classified: Secondary | ICD-10-CM

## 2017-03-20 DIAGNOSIS — M25512 Pain in left shoulder: Secondary | ICD-10-CM | POA: Diagnosis not present

## 2017-03-20 DIAGNOSIS — R293 Abnormal posture: Secondary | ICD-10-CM

## 2017-03-20 DIAGNOSIS — M6281 Muscle weakness (generalized): Secondary | ICD-10-CM

## 2017-03-20 NOTE — Therapy (Signed)
[facility] [address]  [place], [place], [address] Phone: [phone]   Fax:  [phone]  Physical Therapy Treatment  Patient Details  Name: Jose [name]. MRN: [id number] Date of Birth: [birth year] Referring Provider: Dr. Mardelle Matte  Encounter Date: 03/20/2017      PT End of Session - 03/20/17 1301    Visit Number 18   Number of Visits 24   Date for PT Re-Evaluation 04/02/17   Authorization Type Medicare / Tricare - PT only   PT Start Time 1104   PT Stop Time 1202   PT Time Calculation (min) 58 min   Activity Tolerance Patient tolerated treatment well   Behavior During Therapy [facility] for tasks assessed/performed      Past Medical History:  Diagnosis Date  . Arthritis   . Dysrhythmia    afib  . Gallstones 07/1996  . Headache   . History of CT scan of head 1997   small vessel disease  . History of ETT 05/2002    no EKG changes  . History of MRI of cervical spine 07/15/2002  . Hypertension   . Lumbar spondylolysis 12/17/2016  . MGUS (monoclonal gammopathy of unknown significance) 04/11/2012  . Peripheral neuropathy   . Pneumonia 10/2014  . PONV (postoperative nausea and vomiting)    once many years ago  . Primary localized osteoarthrosis of left shoulder 12/25/2016  . Rectal bleeding 12/2002   colonoscopy  . Seizures ([place])    last seizure 1976  . Skin cancer   . Sleep apnea    ? to have study after surgery  . Stroke [facility])    ?tia ?afib  . TIA (transient ischemic attack)   . 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 ADENOIDECTOMY    . ANTERIOR CERVICAL DECOMP/DISCECTOMY FUSION N/A 02/04/2015   Procedure: ANTERIOR CERVICAL DECOMPRESSION/DISCECTOMY FUSION CERVICAL FIVE-SIX,CERVICAL SIX-SEVEN;  Surgeon: Karie Chimera, MD;  Location: [place] [facility];  Service: Neurosurgery;  Laterality:  N/A;  . CATARACT EXTRACTION Bilateral   . COLONOSCOPY W/ POLYPECTOMY  04/19/2003   tubular adenoma  . GREAT TOE ARTHRODESIS, INTERPHALANGEAL JOINT Left 1978  . keratosis excision Right 05/1998   wrist  . nerve transfer surgery to lwft hand Left    Nov. 2017  . SHOULDER ARTHROSCOPY Left 13   rotator cuff   . SHOULDER INJECTION  June 2012   left, [place]  . SKIN CANCER EXCISION  07/1993   left lateral arm  . thumb injection  June 2012   left, [place]  . TONSILLECTOMY    . TOTAL SHOULDER ARTHROPLASTY Left 12/25/2016   Procedure: TOTAL SHOULDER ARTHROPLASTY;  Surgeon: Marchia Bond, MD;  Location: [place];  Service: Orthopedics;  Laterality: Left;  . ulnar nerve transfer  12/1999   left    There were no vitals filed for this visit.      Subjective Assessment - 03/20/17 1300    Subjective Reports good progress with shoulder-  only notices pain/tightness when reaching to opposite shoulder   Patient Stated Goals Improve L shoulder function   Currently in Pain? No/denies   Pain Score 0-No pain                         [facility] Adult PT Treatment/Exercise - 03/20/17 1308  Shoulder Exercises: Standing   External Rotation Strengthening;Left;15 reps;Theraband   Theraband Level (Shoulder External Rotation) Level 3 (Green)   External Rotation Limitations limited motion   Internal Rotation Strengthening;Left;15 reps;Theraband   Theraband Level (Shoulder Internal Rotation) Level 3 (Green)   Row Strengthening;Left;20 reps;Theraband   Theraband Level (Shoulder Row) Level 3 (Green)   Row Limitations good scap squeeze - no hiking   Other Standing Exercises cabinet reaches x 15 - 1# - into flexion     Shoulder Exercises: ROM/Strengthening   Rhythmic Stabilization, Supine by PT - pressure at proximal humerus - 4 x 30 seconds     Vasopneumatic   Number Minutes Vasopneumatic  15 minutes   Vasopnuematic Location  Shoulder   Vasopneumatic Pressure Medium   Vasopneumatic  Temperature  coldest temp     Manual Therapy   Manual Therapy Soft tissue mobilization;Myofascial release   Manual therapy comments patient supine with bolster under knees   Soft tissue mobilization STM to L lats, infraspinatus, teres major/minor, pecs & ant/mid/posterior deltoid   Myofascial Release manual trigger point release to infra, teres group, mid deltoid, and pec region                  PT Short Term Goals - 03/08/17 1231      PT SHORT TERM GOAL #1   Title Patient to be independent with intial stretching/strengthening HEP (03/05/17)   Status Achieved     PT SHORT TERM GOAL #2   Title Patient to improve L shoulder PROM: flexion 160, ER 75, and IR 80 - without pain limiting (03/05/17)   Status On-going     PT SHORT TERM GOAL #3   Title Patient to improve gross L shoulder strength to 3+/5 (03/05/17)   Status On-going           PT Long Term Goals - 03/20/17 1301      PT LONG TERM GOAL #1   Title Patient to be independent with advanced HEP for L shoulder strengthening (04/02/17)   Status On-going     PT LONG TERM GOAL #2   Title Patient to improve L shoulder AROM: flexion 160, ER 75, IR 80 wihtout pain limiting (04/02/17)   Status On-going     PT LONG TERM GOAL #3   Title Patient to demonstrate ability to perform ADLs and basic household chores without pain or functional limatations (04/02/17)   Status On-going     PT LONG TERM GOAL #4   Title Patient to improve L shoulder strength to >/= 4+/5 (04/02/17)   Status On-going               Plan - 03/20/17 1301    Clinical Impression Statement Patient doing well today - some reports of L posterior shoulder pain/tightness when reaching to opposite shoulder. Does continue to present with taut band and TP throughout posterior shoulder conplex as well as tightness into pec and deltoid area. Doing well with strengthening up to this point with continued work required into strengthening in overhead positions/against  gravity.    PT Treatment/Interventions ADLs/Self Care Home Management;Cryotherapy;Electrical Stimulation;Moist Heat;Ultrasound;Therapeutic exercise;Therapeutic activities;Patient/family education;Manual techniques;Scar mobilization;Passive range of motion;Vasopneumatic Device;Taping;Dry needling;Neuromuscular re-education   PT Next Visit Plan progress per TSA protocol; continue vasopneumatic compression as indicated; estim as indicated for pain   Consulted and Agree with Plan of Care Patient      Patient will benefit from skilled therapeutic intervention in order to improve the following deficits and impairments:  Pain, Impaired UE  functional use, Decreased strength, Decreased range of motion, Decreased activity tolerance, Decreased endurance  Visit Diagnosis: Acute pain of left shoulder  Stiffness of left shoulder, not elsewhere classified  Muscle weakness (generalized)  Abnormal posture     Problem List Patient Active Problem List   Diagnosis Date Noted  . Primary localized osteoarthrosis of left shoulder 12/25/2016  . S/P shoulder replacement 12/25/2016  . Lumbar spondylolysis 12/17/2016  . Paresthesia 01/24/2016  . 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 extremity 03/29/2015  . Cervical spinal stenosis 02/04/2015  . Bronchiectasis without acute exacerbation ([place]) 01/11/2015  . Ulnar neuropathy at elbow 12/24/2014  . TIA (transient ischemic attack) 12/15/2014  . Hyperlipidemia 11/24/2014  . Hypoxia   . New onset a-fib ([place])   . MGUS (monoclonal gammopathy of unknown significance) 04/11/2012  . SEBORRHEIC KERATOSIS 11/09/2010  . CPK, ABNORMAL 09/03/2010  . DERMATOPHYTOSIS OF FOOT 08/03/2010  . MYALGIA 12/27/2009  . GOUT, UNSPECIFIED 10/04/2009  . HYPERTROPHY PROSTATE W/O UR OBST & OTH LUTS 12/14/2008  . ALLERGIC CONJUNCTIVITIS 12/03/2007  . TESTOSTERONE DEFICIENCY 11/14/2007  . Backache 04/22/2007  . HYPERLIPIDEMIA 12/26/2006  . OBESITY, NOS 12/26/2006  . IMPOTENCE  INORGANIC 12/26/2006  . Hereditary and idiopathic peripheral neuropathy 12/26/2006  . HEARING LOSS NOS OR DEAFNESS 12/26/2006  . HYPERTENSION, BENIGN SYSTEMIC 12/26/2006  . OSTEOARTHRITIS, MULTI SITES 12/26/2006  . Musculoskeletal disorder and symptoms referable to neck 12/26/2006     Lanney Gins, PT, DPT 03/20/17 1:11 PM   [facility] [address]  [place], [place], [address] Phone: [phone]   Fax:  [phone]  Name: Jose [name]. MRN: [id number] Date of Birth: Jul 13, 1936

## 2017-03-22 ENCOUNTER — Ambulatory Visit: Payer: Medicare Other | Admitting: Rehabilitation

## 2017-03-22 ENCOUNTER — Encounter: Payer: Self-pay | Admitting: Rehabilitation

## 2017-03-22 DIAGNOSIS — M25512 Pain in left shoulder: Secondary | ICD-10-CM

## 2017-03-22 DIAGNOSIS — R293 Abnormal posture: Secondary | ICD-10-CM

## 2017-03-22 DIAGNOSIS — M25612 Stiffness of left shoulder, not elsewhere classified: Secondary | ICD-10-CM

## 2017-03-22 DIAGNOSIS — M6281 Muscle weakness (generalized): Secondary | ICD-10-CM

## 2017-03-22 NOTE — Therapy (Signed)
[facility] [address]  [place], [place], [address] Phone: [phone]   Fax:  [phone]  Physical Therapy Treatment  Patient Details  Name: Jose [name]. MRN: [id number] Date of Birth: Feb 27, 1936 Referring Provider: Dr. Mardelle Matte  Encounter Date: 03/22/2017      PT End of Session - 03/22/17 1134    Visit Number 19   Number of Visits 24   Date for PT Re-Evaluation 04/02/17   Authorization Type Medicare / Tricare - PT only   PT Start Time 1100   PT Stop Time 1155   PT Time Calculation (min) 55 min   Activity Tolerance Patient tolerated treatment well      Past Medical History:  Diagnosis Date  . Arthritis   . Dysrhythmia    afib  . Gallstones 07/1996  . Headache   . History of CT scan of head 1997   small vessel disease  . History of ETT 05/2002    no EKG changes  . History of MRI of cervical spine 07/15/2002  . Hypertension   . Lumbar spondylolysis 12/17/2016  . MGUS (monoclonal gammopathy of unknown significance) 04/11/2012  . Peripheral neuropathy   . Pneumonia 10/2014  . PONV (postoperative nausea and vomiting)    once many years ago  . Primary localized osteoarthrosis of left shoulder 12/25/2016  . Rectal bleeding 12/2002   colonoscopy  . Seizures ([place])    last seizure 1976  . Skin cancer   . Sleep apnea    ? to have study after surgery  . Stroke [facility])    ?tia ?afib  . TIA (transient ischemic attack)   . 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 ADENOIDECTOMY    . ANTERIOR CERVICAL DECOMP/DISCECTOMY FUSION N/A 02/04/2015   Procedure: ANTERIOR CERVICAL DECOMPRESSION/DISCECTOMY FUSION CERVICAL FIVE-SIX,CERVICAL SIX-SEVEN;  Surgeon: Karie Chimera, MD;  Location: [place] [facility];  Service: Neurosurgery;  Laterality: N/A;  . CATARACT EXTRACTION Bilateral   . COLONOSCOPY W/  POLYPECTOMY  04/19/2003   tubular adenoma  . GREAT TOE ARTHRODESIS, INTERPHALANGEAL JOINT Left 1978  . keratosis excision Right 05/1998   wrist  . nerve transfer surgery to lwft hand Left    Nov. 2017  . SHOULDER ARTHROSCOPY Left 13   rotator cuff   . SHOULDER INJECTION  June 2012   left, [place]  . SKIN CANCER EXCISION  07/1993   left lateral arm  . thumb injection  June 2012   left, [place]  . TONSILLECTOMY    . TOTAL SHOULDER ARTHROPLASTY Left 12/25/2016   Procedure: TOTAL SHOULDER ARTHROPLASTY;  Surgeon: Marchia Bond, MD;  Location: [place];  Service: Orthopedics;  Laterality: Left;  . ulnar nerve transfer  12/1999   left    There were no vitals filed for this visit.      Subjective Assessment - 03/22/17 1102    Subjective "The only thing I would report is that after Wednesday I have felt more sore"    Patient Stated Goals Improve L shoulder function   Currently in Pain? No/denies                         [facility] Adult PT Treatment/Exercise - 03/22/17 0001      Shoulder Exercises: Standing   External Rotation Strengthening;Left;15 reps;Theraband   Theraband  Level (Shoulder External Rotation) Level 3 (Green)   Internal Rotation Strengthening;Left;15 reps;Theraband   Theraband Level (Shoulder Internal Rotation) Level 3 (Green)   ABduction Strengthening;Left;10 reps   Shoulder ABduction Weight (lbs) 2#   ABduction Limitations scaption   Row Strengthening;Left;20 reps;Theraband   Theraband Level (Shoulder Row) Level 3 (Green)   Other Standing Exercises 1# ball roll up with lift off flexion x 10     Shoulder Exercises: ROM/Strengthening   UBE (Upper Arm Bike) lvl 1.5 fwd/back x 2' each - L arm primarily driving machine     Manual Therapy   Passive ROM to tolerance all directions                  PT Short Term Goals - 03/08/17 1231      PT SHORT TERM GOAL #1   Title Patient to be independent with intial stretching/strengthening HEP (03/05/17)    Status Achieved     PT SHORT TERM GOAL #2   Title Patient to improve L shoulder PROM: flexion 160, ER 75, and IR 80 - without pain limiting (03/05/17)   Status On-going     PT SHORT TERM GOAL #3   Title Patient to improve gross L shoulder strength to 3+/5 (03/05/17)   Status On-going           PT Long Term Goals - 03/22/17 1135      PT LONG TERM GOAL #1   Title Patient to be independent with advanced HEP for L shoulder strengthening (04/02/17)   Status On-going     PT LONG TERM GOAL #2   Title Patient to improve L shoulder AROM: flexion 160, ER 75, IR 80 wihtout pain limiting (04/02/17)   Status On-going     PT LONG TERM GOAL #3   Title Patient to demonstrate ability to perform ADLs and basic household chores without pain or functional limatations (04/02/17)   Status On-going     PT LONG TERM GOAL #4   Title Patient to improve L shoulder strength to >/= 4+/5 (04/02/17)   Status On-going               Plan - 03/22/17 1134    Clinical Impression Statement Some reports of increased soreness today with the last visit and during row today in the anterior shoulder.  Continues with good active and passive ROM.     PT Treatment/Interventions ADLs/Self Care Home Management;Cryotherapy;Electrical Stimulation;Moist Heat;Ultrasound;Therapeutic exercise;Therapeutic activities;Patient/family education;Manual techniques;Scar mobilization;Passive range of motion;Vasopneumatic Device;Taping;Dry needling;Neuromuscular re-education   PT Next Visit Plan progress per TSA protocol; continue vasopneumatic compression as indicated; estim as indicated for pain      Patient will benefit from skilled therapeutic intervention in order to improve the following deficits and impairments:  Pain, Impaired UE functional use, Decreased strength, Decreased range of motion, Decreased activity tolerance, Decreased endurance  Visit Diagnosis: Acute pain of left shoulder  Stiffness of left shoulder, not elsewhere  classified  Muscle weakness (generalized)  Abnormal posture     Problem List Patient Active Problem List   Diagnosis Date Noted  . Primary localized osteoarthrosis of left shoulder 12/25/2016  . S/P shoulder replacement 12/25/2016  . Lumbar spondylolysis 12/17/2016  . Paresthesia 01/24/2016  . 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 extremity 03/29/2015  . Cervical spinal stenosis 02/04/2015  . Bronchiectasis without acute exacerbation ([place]) 01/11/2015  . Ulnar neuropathy at elbow 12/24/2014  . TIA (transient ischemic attack) 12/15/2014  . Hyperlipidemia 11/24/2014  . Hypoxia   . New  onset a-fib ([place])   . MGUS (monoclonal gammopathy of unknown significance) 04/11/2012  . SEBORRHEIC KERATOSIS 11/09/2010  . CPK, ABNORMAL 09/03/2010  . DERMATOPHYTOSIS OF FOOT 08/03/2010  . MYALGIA 12/27/2009  . GOUT, UNSPECIFIED 10/04/2009  . HYPERTROPHY PROSTATE W/O UR OBST & OTH LUTS 12/14/2008  . ALLERGIC CONJUNCTIVITIS 12/03/2007  . TESTOSTERONE DEFICIENCY 11/14/2007  . Backache 04/22/2007  . HYPERLIPIDEMIA 12/26/2006  . OBESITY, NOS 12/26/2006  . 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 Hereditary and idiopathic peripheral neuropathy 12/26/2006  . HEARING LOSS NOS OR DEAFNESS 12/26/2006  . HYPERTENSION, BENIGN SYSTEMIC 12/26/2006  . OSTEOARTHRITIS, MULTI SITES 12/26/2006  . Musculoskeletal disorder and symptoms referable to neck 12/26/2006    Stark Bray, DPT, CMP 03/22/2017, 11:45 AM  [facility] [address]  [place], [place], [address] Phone: [phone]   Fax:  [phone]  Name: Jose [name]. MRN: [id number] Date of Birth: December 20, 1935

## 2017-03-24 ENCOUNTER — Other Ambulatory Visit: Payer: Self-pay | Admitting: Neurology

## 2017-03-26 ENCOUNTER — Ambulatory Visit: Payer: Medicare Other | Admitting: Rehabilitation

## 2017-03-26 ENCOUNTER — Encounter: Payer: Self-pay | Admitting: Rehabilitation

## 2017-03-26 DIAGNOSIS — M6281 Muscle weakness (generalized): Secondary | ICD-10-CM

## 2017-03-26 DIAGNOSIS — M25512 Pain in left shoulder: Secondary | ICD-10-CM | POA: Diagnosis not present

## 2017-03-26 DIAGNOSIS — M25612 Stiffness of left shoulder, not elsewhere classified: Secondary | ICD-10-CM

## 2017-03-26 NOTE — Therapy (Signed)
[facility] [address]  [place], [place], [address] Phone: [phone]   Fax:  [phone]  Physical Therapy Treatment  Patient Details  Name: Jose [name]. MRN: [id number] Date of Birth: May 11, 1936 Referring Provider: Dr. Mardelle Matte  Encounter Date: 03/26/2017      PT End of Session - 03/26/17 1104    Visit Number 20   Number of Visits 24   Date for PT Re-Evaluation 04/02/17   Authorization Type Medicare / Tricare - PT only   PT Start Time 1100   PT Stop Time 1157   PT Time Calculation (min) 57 min   Activity Tolerance Patient tolerated treatment well      Past Medical History:  Diagnosis Date  . Arthritis   . Dysrhythmia    afib  . Gallstones 07/1996  . Headache   . History of CT scan of head 1997   small vessel disease  . History of ETT 05/2002    no EKG changes  . History of MRI of cervical spine 07/15/2002  . Hypertension   . Lumbar spondylolysis 12/17/2016  . MGUS (monoclonal gammopathy of unknown significance) 04/11/2012  . Peripheral neuropathy   . Pneumonia 10/2014  . PONV (postoperative nausea and vomiting)    once many years ago  . Primary localized osteoarthrosis of left shoulder 12/25/2016  . Rectal bleeding 12/2002   colonoscopy  . Seizures ([place])    last seizure 1976  . Skin cancer   . Sleep apnea    ? to have study after surgery  . Stroke [facility])    ?tia ?afib  . TIA (transient ischemic attack)   . 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 ADENOIDECTOMY    . ANTERIOR CERVICAL DECOMP/DISCECTOMY FUSION N/A 02/04/2015   Procedure: ANTERIOR CERVICAL DECOMPRESSION/DISCECTOMY FUSION CERVICAL FIVE-SIX,CERVICAL SIX-SEVEN;  Surgeon: Karie Chimera, MD;  Location: [place] [facility];  Service: Neurosurgery;  Laterality: N/A;  . CATARACT EXTRACTION Bilateral   . COLONOSCOPY W/  POLYPECTOMY  04/19/2003   tubular adenoma  . GREAT TOE ARTHRODESIS, INTERPHALANGEAL JOINT Left 1978  . keratosis excision Right 05/1998   wrist  . nerve transfer surgery to lwft hand Left    Nov. 2017  . SHOULDER ARTHROSCOPY Left 13   rotator cuff   . SHOULDER INJECTION  June 2012   left, [place]  . SKIN CANCER EXCISION  07/1993   left lateral arm  . thumb injection  June 2012   left, [place]  . TONSILLECTOMY    . TOTAL SHOULDER ARTHROPLASTY Left 12/25/2016   Procedure: TOTAL SHOULDER ARTHROPLASTY;  Surgeon: Marchia Bond, MD;  Location: [place];  Service: Orthopedics;  Laterality: Left;  . ulnar nerve transfer  12/1999   left    There were no vitals filed for this visit.      Subjective Assessment - 03/26/17 1102    Subjective no new c/o today   Currently in Pain? No/denies            [facility] PT Assessment - 03/26/17 0001      Observation/Other Assessments   Focus on Therapeutic Outcomes (FOTO)  39% limited                     [facility] Adult PT Treatment/Exercise - 03/26/17 0001      Shoulder Exercises: Supine  Other Supine Exercises tricep press 3# x 15   Other Supine Exercises body blade u/d, and in/out 3x15" each     Shoulder Exercises: Seated   Other Seated Exercises seated row machine narrow 25# x 15 and wide 15# x 10 L only     Shoulder Exercises: Standing   External Rotation Strengthening;Left;15 reps;Theraband   Theraband Level (Shoulder External Rotation) Level 3 (Green)   External Rotation Limitations 2 sets   Internal Rotation Strengthening;Left;15 reps;Theraband   Theraband Level (Shoulder Internal Rotation) Level 3 (Green)   Internal Rotation Limitations 2 sets   Other Standing Exercises bicep curls 8# x 15     Shoulder Exercises: Therapy Ball   Other Therapy Ball Exercises back against orange ball for scapular retracted position flexion and scaption 2# x 10 each   Other Therapy Ball Exercises pball push ups x 20     Shoulder Exercises:  ROM/Strengthening   UBE (Upper Arm Bike) level 1.5 5min forward/60min backward     Vasopneumatic   Number Minutes Vasopneumatic  15 minutes   Vasopnuematic Location  Shoulder   Vasopneumatic Pressure Medium   Vasopneumatic Temperature  coldest temp     Manual Therapy   Passive ROM to tolerance all directions                  PT Short Term Goals - 03/08/17 1231      PT SHORT TERM GOAL #1   Title Patient to be independent with intial stretching/strengthening HEP (03/05/17)   Status Achieved     PT SHORT TERM GOAL #2   Title Patient to improve L shoulder PROM: flexion 160, ER 75, and IR 80 - without pain limiting (03/05/17)   Status On-going     PT SHORT TERM GOAL #3   Title Patient to improve gross L shoulder strength to 3+/5 (03/05/17)   Status On-going           PT Long Term Goals - 03/22/17 1135      PT LONG TERM GOAL #1   Title Patient to be independent with advanced HEP for L shoulder strengthening (04/02/17)   Status On-going     PT LONG TERM GOAL #2   Title Patient to improve L shoulder AROM: flexion 160, ER 75, IR 80 wihtout pain limiting (04/02/17)   Status On-going     PT LONG TERM GOAL #3   Title Patient to demonstrate ability to perform ADLs and basic household chores without pain or functional limatations (04/02/17)   Status On-going     PT LONG TERM GOAL #4   Title Patient to improve L shoulder strength to >/= 4+/5 (04/02/17)   Status On-going               Plan - 03/26/17 1114    Clinical Impression Statement continues to progress strengthening and ROM and is moving closer to independence with HEP.  Pt agreeable to end PT after the last scheduled sessions.     PT Frequency 3x / week   PT Duration 8 weeks   PT Treatment/Interventions ADLs/Self Care Home Management;Cryotherapy;Electrical Stimulation;Moist Heat;Ultrasound;Therapeutic exercise;Therapeutic activities;Patient/family education;Manual techniques;Scar mobilization;Passive range of  motion;Vasopneumatic Device;Taping;Dry needling;Neuromuscular re-education   PT Next Visit Plan progress per TSA protocol; continue vasopneumatic compression as indicated; estim as indicated for pain      Patient will benefit from skilled therapeutic intervention in order to improve the following deficits and impairments:  Pain, Impaired UE functional use, Decreased strength, Decreased range of motion, Decreased  activity tolerance, Decreased endurance  Visit Diagnosis: Acute pain of left shoulder  Stiffness of left shoulder, not elsewhere classified  Muscle weakness (generalized)       G-Codes - 2017/04/19 1107    Functional Assessment Tool Used (Outpatient Only) FOTO 39%    Functional Limitation Carrying, moving and handling objects   Carrying, Moving and Handling Objects Current Status (L3810) At least 20 percent but less than 40 percent impaired, limited or restricted   Carrying, Moving and Handling Objects Goal Status (F7510) At least 20 percent but less than 40 percent impaired, limited or restricted      Problem List Patient Active Problem List   Diagnosis Date Noted  . Primary localized osteoarthrosis of left shoulder 12/25/2016  . S/P shoulder replacement 12/25/2016  . Lumbar spondylolysis 12/17/2016  . Paresthesia 01/24/2016  . 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 extremity 03/29/2015  . Cervical spinal stenosis 02/04/2015  . Bronchiectasis without acute exacerbation ([place]) 01/11/2015  . Ulnar neuropathy at elbow 12/24/2014  . TIA (transient ischemic attack) 12/15/2014  . Hyperlipidemia 11/24/2014  . Hypoxia   . New onset a-fib ([place])   . MGUS (monoclonal gammopathy of unknown significance) 04/11/2012  . SEBORRHEIC KERATOSIS 11/09/2010  . CPK, ABNORMAL 09/03/2010  . DERMATOPHYTOSIS OF FOOT 08/03/2010  . MYALGIA 12/27/2009  . GOUT, UNSPECIFIED 10/04/2009  . HYPERTROPHY PROSTATE W/O UR OBST & OTH LUTS 12/14/2008  . ALLERGIC CONJUNCTIVITIS 12/03/2007  .  TESTOSTERONE DEFICIENCY 11/14/2007  . Backache 04/22/2007  . HYPERLIPIDEMIA 12/26/2006  . OBESITY, NOS 12/26/2006  . 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 Hereditary and idiopathic peripheral neuropathy 12/26/2006  . HEARING LOSS NOS OR DEAFNESS 12/26/2006  . HYPERTENSION, BENIGN SYSTEMIC 12/26/2006  . OSTEOARTHRITIS, MULTI SITES 12/26/2006  . Musculoskeletal disorder and symptoms referable to neck 12/26/2006    Stark Bray, DPT, CMP 04-19-17, 11:43 AM  [facility] [address]  [place], [place], [address] Phone: [phone]   Fax:  [phone]  Name: Jose [name]. MRN: [id number] Date of Birth: 11/29/1935

## 2017-03-27 ENCOUNTER — Ambulatory Visit: Payer: Medicare Other | Admitting: Physical Therapy

## 2017-03-27 DIAGNOSIS — R293 Abnormal posture: Secondary | ICD-10-CM

## 2017-03-27 DIAGNOSIS — M25512 Pain in left shoulder: Secondary | ICD-10-CM | POA: Diagnosis not present

## 2017-03-27 DIAGNOSIS — M25612 Stiffness of left shoulder, not elsewhere classified: Secondary | ICD-10-CM

## 2017-03-27 DIAGNOSIS — M6281 Muscle weakness (generalized): Secondary | ICD-10-CM

## 2017-03-27 NOTE — Therapy (Signed)
[facility] [address]  [place], [place], [address] Phone: [phone]   Fax:  [phone]  Physical Therapy Treatment  Patient Details  Name: Jose [name]. MRN: [id number] Date of Birth: 1936/07/04 Referring Provider: Dr. Mardelle Matte  Encounter Date: 03/27/2017      PT End of Session - 03/27/17 1059    Visit Number 21   Number of Visits 24   Date for PT Re-Evaluation 04/02/17   Authorization Type Medicare / Tricare - PT only   PT Start Time 1057   PT Stop Time 1153   PT Time Calculation (min) 56 min   Activity Tolerance Patient tolerated treatment well   Behavior During Therapy [facility] for tasks assessed/performed      Past Medical History:  Diagnosis Date  . Arthritis   . Dysrhythmia    afib  . Gallstones 07/1996  . Headache   . History of CT scan of head 1997   small vessel disease  . History of ETT 05/2002    no EKG changes  . History of MRI of cervical spine 07/15/2002  . Hypertension   . Lumbar spondylolysis 12/17/2016  . MGUS (monoclonal gammopathy of unknown significance) 04/11/2012  . Peripheral neuropathy   . Pneumonia 10/2014  . PONV (postoperative nausea and vomiting)    once many years ago  . Primary localized osteoarthrosis of left shoulder 12/25/2016  . Rectal bleeding 12/2002   colonoscopy  . Seizures ([place])    last seizure 1976  . Skin cancer   . Sleep apnea    ? to have study after surgery  . Stroke [facility])    ?tia ?afib  . TIA (transient ischemic attack)   . 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 ADENOIDECTOMY    . ANTERIOR CERVICAL DECOMP/DISCECTOMY FUSION N/A 02/04/2015   Procedure: ANTERIOR CERVICAL DECOMPRESSION/DISCECTOMY FUSION CERVICAL FIVE-SIX,CERVICAL SIX-SEVEN;  Surgeon: Karie Chimera, MD;  Location: [place] [facility];  Service: Neurosurgery;  Laterality:  N/A;  . CATARACT EXTRACTION Bilateral   . COLONOSCOPY W/ POLYPECTOMY  04/19/2003   tubular adenoma  . GREAT TOE ARTHRODESIS, INTERPHALANGEAL JOINT Left 1978  . keratosis excision Right 05/1998   wrist  . nerve transfer surgery to lwft hand Left    Nov. 2017  . SHOULDER ARTHROSCOPY Left 13   rotator cuff   . SHOULDER INJECTION  June 2012   left, [place]  . SKIN CANCER EXCISION  07/1993   left lateral arm  . thumb injection  June 2012   left, [place]  . TONSILLECTOMY    . TOTAL SHOULDER ARTHROPLASTY Left 12/25/2016   Procedure: TOTAL SHOULDER ARTHROPLASTY;  Surgeon: Marchia Bond, MD;  Location: [place];  Service: Orthopedics;  Laterality: Left;  . ulnar nerve transfer  12/1999   left    There were no vitals filed for this visit.      Subjective Assessment - 03/27/17 1058    Subjective feeling well - no complaints, feels like hes made good progress - can use L arm to do "almost everything"   Patient Stated Goals Improve L shoulder function   Currently in Pain? No/denies   Pain Score 0-No pain                         [facility] Adult PT Treatment/Exercise -  03/27/17 1100      Shoulder Exercises: Supine   Other Supine Exercises tricep press 3# x 15   Other Supine Exercises serratus punch: LUE - 5# 2 x 10     Shoulder Exercises: Standing   Extension Strengthening;Left;20 reps;Theraband   Theraband Level (Shoulder Extension) Level 3 (Green)   Extension Limitations with scap squeeze   Other Standing Exercises Cabinet reaches: flexion and abduction - 1# x 15 each   Other Standing Exercises standing against pool noodle - 2# into scaption 2 x 10 L UE only     Shoulder Exercises: Therapy Ball   Other Therapy Ball Exercises pball push ups x 20     Shoulder Exercises: ROM/Strengthening   UBE (Upper Arm Bike) Level 2 - 2/2   Rhythmic Stabilization, Supine manual: distal humerus: 3 x 30 sec all directions   Other ROM/Strengthening Exercises Bicep curls - 8# L UE only x 15  reps     Vasopneumatic   Number Minutes Vasopneumatic  15 minutes   Vasopnuematic Location  Shoulder   Vasopneumatic Pressure Medium   Vasopneumatic Temperature  coldest temp     Manual Therapy   Passive ROM contract-relax into ER                  PT Short Term Goals - 03/08/17 1231      PT SHORT TERM GOAL #1   Title Patient to be independent with intial stretching/strengthening HEP (03/05/17)   Status Achieved     PT SHORT TERM GOAL #2   Title Patient to improve L shoulder PROM: flexion 160, ER 75, and IR 80 - without pain limiting (03/05/17)   Status On-going     PT SHORT TERM GOAL #3   Title Patient to improve gross L shoulder strength to 3+/5 (03/05/17)   Status On-going           PT Long Term Goals - 03/22/17 1135      PT LONG TERM GOAL #1   Title Patient to be independent with advanced HEP for L shoulder strengthening (04/02/17)   Status On-going     PT LONG TERM GOAL #2   Title Patient to improve L shoulder AROM: flexion 160, ER 75, IR 80 wihtout pain limiting (04/02/17)   Status On-going     PT LONG TERM GOAL #3   Title Patient to demonstrate ability to perform ADLs and basic household chores without pain or functional limatations (04/02/17)   Status On-going     PT LONG TERM GOAL #4   Title Patient to improve L shoulder strength to >/= 4+/5 (04/02/17)   Status On-going               Plan - 03/27/17 1059    Clinical Impression Statement Feeling well with current progress of L shoulder, feels like he will be ready to transition to independent HEP practice after next 2 visits. Fatigue of L shoulder continues to be a primary limtiing factor within sessions, however, will likely improve with continued increased use.    PT Treatment/Interventions ADLs/Self Care Home Management;Cryotherapy;Electrical Stimulation;Moist Heat;Ultrasound;Therapeutic exercise;Therapeutic activities;Patient/family education;Manual techniques;Scar mobilization;Passive range of  motion;Vasopneumatic Device;Taping;Dry needling;Neuromuscular re-education   PT Next Visit Plan progress per TSA protocol; continue vasopneumatic compression as indicated; estim as indicated for pain   Consulted and Agree with Plan of Care Patient      Patient will benefit from skilled therapeutic intervention in order to improve the following deficits and impairments:  Pain, Impaired UE functional  use, Decreased strength, Decreased range of motion, Decreased activity tolerance, Decreased endurance  Visit Diagnosis: Acute pain of left shoulder  Stiffness of left shoulder, not elsewhere classified  Muscle weakness (generalized)  Abnormal posture       G-Codes - 04/21/2017 1107    Functional Assessment Tool Used (Outpatient Only) FOTO 39%    Functional Limitation Carrying, moving and handling objects   Carrying, Moving and Handling Objects Current Status (O4784) At least 20 percent but less than 40 percent impaired, limited or restricted   Carrying, Moving and Handling Objects Goal Status (X2820) At least 20 percent but less than 40 percent impaired, limited or restricted      Problem List Patient Active Problem List   Diagnosis Date Noted  . Primary localized osteoarthrosis of left shoulder 12/25/2016  . S/P shoulder replacement 12/25/2016  . Lumbar spondylolysis 12/17/2016  . Paresthesia 01/24/2016  . 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 extremity 03/29/2015  . Cervical spinal stenosis 02/04/2015  . Bronchiectasis without acute exacerbation ([place]) 01/11/2015  . Ulnar neuropathy at elbow 12/24/2014  . TIA (transient ischemic attack) 12/15/2014  . Hyperlipidemia 11/24/2014  . Hypoxia   . New onset a-fib ([place])   . MGUS (monoclonal gammopathy of unknown significance) 04/11/2012  . SEBORRHEIC KERATOSIS 11/09/2010  . CPK, ABNORMAL 09/03/2010  . DERMATOPHYTOSIS OF FOOT 08/03/2010  . MYALGIA 12/27/2009  . GOUT, UNSPECIFIED 10/04/2009  . HYPERTROPHY PROSTATE W/O UR OBST & OTH  LUTS 12/14/2008  . ALLERGIC CONJUNCTIVITIS 12/03/2007  . TESTOSTERONE DEFICIENCY 11/14/2007  . Backache 04/22/2007  . HYPERLIPIDEMIA 12/26/2006  . OBESITY, NOS 12/26/2006  . 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 Hereditary and idiopathic peripheral neuropathy 12/26/2006  . HEARING LOSS NOS OR DEAFNESS 12/26/2006  . HYPERTENSION, BENIGN SYSTEMIC 12/26/2006  . OSTEOARTHRITIS, MULTI SITES 12/26/2006  . Musculoskeletal disorder and symptoms referable to neck 12/26/2006     Lanney Gins, PT, DPT 03/27/17 11:53 AM   [facility] [address]  [place], [place], [address] Phone: [phone]   Fax:  [phone]  Name: Monta Police. MRN: [id number] Date of Birth: Jun 28, 1936

## 2017-03-29 ENCOUNTER — Ambulatory Visit: Payer: Medicare Other | Attending: Orthopedic Surgery | Admitting: Rehabilitation

## 2017-03-29 ENCOUNTER — Encounter: Payer: Self-pay | Admitting: Rehabilitation

## 2017-03-29 DIAGNOSIS — M25612 Stiffness of left shoulder, not elsewhere classified: Secondary | ICD-10-CM | POA: Insufficient documentation

## 2017-03-29 DIAGNOSIS — M25512 Pain in left shoulder: Secondary | ICD-10-CM | POA: Diagnosis present

## 2017-03-29 DIAGNOSIS — M6281 Muscle weakness (generalized): Secondary | ICD-10-CM | POA: Diagnosis present

## 2017-03-29 DIAGNOSIS — R293 Abnormal posture: Secondary | ICD-10-CM | POA: Insufficient documentation

## 2017-03-29 MED FILL — METHYLPREDNISOLONE 4 MG TAB: 4 | 6 days supply | Qty: 21 | Fill #0

## 2017-03-29 MED FILL — MELOXICAM 15 MG TABLET: 15 | 30 days supply | Qty: 30 | Fill #0

## 2017-03-29 NOTE — Therapy (Signed)
[facility] [address]  [place], [place], [address] Phone: [phone]   Fax:  [phone]  Physical Therapy Treatment  Patient Details  Name: Jose [name]. MRN: [id number] Date of Birth: [birth year] Referring Provider: Dr. Mardelle Matte  Encounter Date: 03/29/2017      PT End of Session - 03/29/17 1150    Visit Number 22   Number of Visits 24   Authorization Type Medicare / [employer] - PT only   PT Start Time 1104   PT Stop Time 1200   PT Time Calculation (min) 56 min   Activity Tolerance Patient limited by pain      Past Medical History:  Diagnosis Date  . Arthritis   . Dysrhythmia    afib  . Gallstones 07/1996  . Headache   . History of CT scan of head 1997   small vessel disease  . History of ETT 05/2002    no EKG changes  . History of MRI of cervical spine 07/15/2002  . Hypertension   . Lumbar spondylolysis 12/17/2016  . MGUS (monoclonal gammopathy of unknown significance) 04/11/2012  . Peripheral neuropathy   . Pneumonia 10/2014  . PONV (postoperative nausea and vomiting)    once many years ago  . Primary localized osteoarthrosis of left shoulder 12/25/2016  . Rectal bleeding 12/2002   colonoscopy  . Seizures ([place])    last seizure 1976  . Skin cancer   . Sleep apnea    ? to have study after surgery  . Stroke [facility])    ?tia ?afib  . TIA (transient ischemic attack)   . 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 ADENOIDECTOMY    . ANTERIOR CERVICAL DECOMP/DISCECTOMY FUSION N/A 02/04/2015   Procedure: ANTERIOR CERVICAL DECOMPRESSION/DISCECTOMY FUSION CERVICAL FIVE-SIX,CERVICAL SIX-SEVEN;  Surgeon: Karie Chimera, MD;  Location: [place] [facility];  Service: Neurosurgery;  Laterality: N/A;  . CATARACT EXTRACTION Bilateral   . COLONOSCOPY W/ POLYPECTOMY  04/19/2003   tubular adenoma  .  GREAT TOE ARTHRODESIS, INTERPHALANGEAL JOINT Left 1978  . keratosis excision Right 05/1998   wrist  . nerve transfer surgery to lwft hand Left    Nov. 2017  . SHOULDER ARTHROSCOPY Left 13   rotator cuff   . SHOULDER INJECTION  June 2012   left, [place]  . SKIN CANCER EXCISION  07/1993   left lateral arm  . thumb injection  June 2012   left, [place]  . TONSILLECTOMY    . TOTAL SHOULDER ARTHROPLASTY Left 12/25/2016   Procedure: TOTAL SHOULDER ARTHROPLASTY;  Surgeon: Marchia Bond, MD;  Location: [place];  Service: Orthopedics;  Laterality: Left;  . ulnar nerve transfer  12/1999   left    There were no vitals filed for this visit.      Subjective Assessment - 03/29/17 1153    Subjective pt arrives with cane today.  after discussing gait abnormalities last visit he made an appt with Dr. Mardelle Matte who thinks everything is coming from lumbar stenosis.  He now has an appt with a neuroloist in a few weeks.  Also tired today   Currently in Pain? No/denies                         [facility] Adult PT Treatment/Exercise - 03/29/17 0001  Shoulder Exercises: Seated   Other Seated Exercises seated press 15# L only x 15, row machine 25# 2x10 bil     Shoulder Exercises: Standing   External Rotation Limitations increased pain today   Internal Rotation Strengthening;Left;15 reps;Theraband   Theraband Level (Shoulder Internal Rotation) Level 3 (Green)   Internal Rotation Limitations 1 set only today due to "crunching"    Other Standing Exercises standing at Nustep level 5 push/pull and horizontal abd/add x 59min each   Other Standing Exercises pain with bicep curls today at bicep insertion after 9 with 8#     Vasopneumatic   Number Minutes Vasopneumatic  15 minutes   Vasopnuematic Location  Shoulder   Vasopneumatic Pressure Medium   Vasopneumatic Temperature  coldest temp     Manual Therapy   Soft tissue mobilization cross friction to biceps tendon insertion x 55min   Passive ROM  PROM to tolerance all directions                  PT Short Term Goals - 03/08/17 1231      PT SHORT TERM GOAL #1   Title Patient to be independent with intial stretching/strengthening HEP (03/05/17)   Status Achieved     PT SHORT TERM GOAL #2   Title Patient to improve L shoulder PROM: flexion 160, ER 75, and IR 80 - without pain limiting (03/05/17)   Status On-going     PT SHORT TERM GOAL #3   Title Patient to improve gross L shoulder strength to 3+/5 (03/05/17)   Status On-going           PT Long Term Goals - 03/29/17 1152      PT LONG TERM GOAL #1   Title Patient to be independent with advanced HEP for L shoulder strengthening (04/02/17)   Status On-going     PT LONG TERM GOAL #2   Title Patient to improve L shoulder AROM: flexion 160, ER 75, IR 80 wihtout pain limiting (04/02/17)   Status On-going     PT LONG TERM GOAL #3   Title Patient to demonstrate ability to perform ADLs and basic household chores without pain or functional limatations (04/02/17)   Status On-going     PT LONG TERM GOAL #4   Title Patient to improve L shoulder strength to >/= 4+/5 (04/02/17)   Status On-going               Plan - 03/29/17 1150    Clinical Impression Statement Pt with increased pain at the biceps tendon insertion today with resisted ER/IR and bicep curls.  Focused on ROM, icing, and STM today after initial exercises.  Pt reports no increase in resting pain.  Most likley from initiation of biceps work and/or increasing strength training at the end of the treatment plan.     PT Next Visit Plan next visit D/C/final HEP      Patient will benefit from skilled therapeutic intervention in order to improve the following deficits and impairments:     Visit Diagnosis: Acute pain of left shoulder  Stiffness of left shoulder, not elsewhere classified  Muscle weakness (generalized)  Abnormal posture     Problem List Patient Active Problem List   Diagnosis Date Noted  .  Primary localized osteoarthrosis of left shoulder 12/25/2016  . S/P shoulder replacement 12/25/2016  . Lumbar spondylolysis 12/17/2016  . Paresthesia 01/24/2016  . 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 extremity 03/29/2015  . Cervical spinal stenosis 02/04/2015  .  Bronchiectasis without acute exacerbation ([place]) 01/11/2015  . Ulnar neuropathy at elbow 12/24/2014  . TIA (transient ischemic attack) 12/15/2014  . Hyperlipidemia 11/24/2014  . Hypoxia   . New onset a-fib ([place])   . MGUS (monoclonal gammopathy of unknown significance) 04/11/2012  . SEBORRHEIC KERATOSIS 11/09/2010  . CPK, ABNORMAL 09/03/2010  . DERMATOPHYTOSIS OF FOOT 08/03/2010  . MYALGIA 12/27/2009  . GOUT, UNSPECIFIED 10/04/2009  . HYPERTROPHY PROSTATE W/O UR OBST & OTH LUTS 12/14/2008  . ALLERGIC CONJUNCTIVITIS 12/03/2007  . TESTOSTERONE DEFICIENCY 11/14/2007  . Backache 04/22/2007  . HYPERLIPIDEMIA 12/26/2006  . OBESITY, NOS 12/26/2006  . 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 Hereditary and idiopathic peripheral neuropathy 12/26/2006  . HEARING LOSS NOS OR DEAFNESS 12/26/2006  . HYPERTENSION, BENIGN SYSTEMIC 12/26/2006  . OSTEOARTHRITIS, MULTI SITES 12/26/2006  . Musculoskeletal disorder and symptoms referable to neck 12/26/2006    Stark Bray, DPT, CMP 03/29/2017, 11:54 AM  [facility] [address]  [place], [place], [address] Phone: [phone]   Fax:  [phone]  Name: Jose [name]. MRN: [id number] Date of Birth: 12-24-1935

## 2017-04-01 ENCOUNTER — Telehealth: Payer: Self-pay | Admitting: Neurology

## 2017-04-01 ENCOUNTER — Ambulatory Visit (INDEPENDENT_AMBULATORY_CARE_PROVIDER_SITE_OTHER): Payer: Medicare Other | Admitting: Neurology

## 2017-04-01 ENCOUNTER — Encounter: Payer: Self-pay | Admitting: Neurology

## 2017-04-01 ENCOUNTER — Ambulatory Visit: Payer: Medicare Other | Admitting: Physical Therapy

## 2017-04-01 VITALS — BP 131/75 | HR 55 | Ht 72.0 in | Wt 226.5 lb

## 2017-04-01 DIAGNOSIS — R293 Abnormal posture: Secondary | ICD-10-CM

## 2017-04-01 DIAGNOSIS — M25612 Stiffness of left shoulder, not elsewhere classified: Secondary | ICD-10-CM

## 2017-04-01 DIAGNOSIS — M25512 Pain in left shoulder: Secondary | ICD-10-CM

## 2017-04-01 DIAGNOSIS — M4306 Spondylolysis, lumbar region: Secondary | ICD-10-CM

## 2017-04-01 DIAGNOSIS — M6281 Muscle weakness (generalized): Secondary | ICD-10-CM

## 2017-04-01 NOTE — Telephone Encounter (Signed)
Called pt back. He stated he had shoulder replacement back in feb via [facility]. Been having trouble walking, "left hip locking up". At first it would begin the first couple hundred feet of walking. Now it has worsened. Thought it was something with his hip. He spoke with physical therapy last week. They noticed some problems with his walking.  He got an appt with orthopaedic doctor last Friday. MD did xray of knee and hip. Both were structurally sound per pt. MD saw some arthritis, but nothing to cause sx. He look at MRI lumbar previously done. MD suggested he might suffer from Spinal stenosis.  He c/o of bowel problems. Tried metamucil/dulcolax OTC for the last few months. He is wondering if this is related to MRI findings.   Made appt today at 4pm, check in 330/345pm. Pt verbalized understanding.

## 2017-04-01 NOTE — Patient Instructions (Signed)
   We will get EMG and NCV study to look at nerve and muscle function of the legs.

## 2017-04-01 NOTE — Progress Notes (Signed)
Reason for visit: Gait disorder   Jose [name]. is an 81 y.o. male  History of present illness:  Jose [name] is a 81 year old right-handed white male with a history of lumbosacral spinal stenosis is most significant at the L2-3 level, he has possible nerve root impingement on the left at L3 and L4 level and on the right at the L4, L5, and L3 levels. The patient has had a chronic issue with walking, but this has worsened gradually over time. He is limited in how far he can walk, he will have increased discomfort in the left hip and back, he will have to rest frequently when walking. The patient feels that the left hip is tight and painful. The patient has a peripheral neuropathy, he has numbness in the feet. The patient does have problems with balance, but he is not falling. The patient denies any issues controlling the bowels or bladder, and he does have some problems with constipation. He believes that he is only able to walk about 100 feet without stopping to rest. He has been placed on a prednisone Dosepak which has helped the symptoms some. He comes to this office for an evaluation. He claims he has had recent x-rays of the left hip and left knee which did not show an etiology for his walking problems.  Past Medical History:  Diagnosis Date  . Arthritis   . Dysrhythmia    afib  . Gallstones 07/1996  . Headache   . History of CT scan of head 1997   small vessel disease  . History of ETT 05/2002    no EKG changes  . History of MRI of cervical spine 07/15/2002  . Hypertension   . Lumbar spondylolysis 12/17/2016  . MGUS (monoclonal gammopathy of unknown significance) 04/11/2012  . Peripheral neuropathy   . Pneumonia 10/2014  . PONV (postoperative nausea and vomiting)    once many years ago  . Primary localized osteoarthrosis of left shoulder 12/25/2016  . Rectal bleeding 12/2002   colonoscopy  . Seizures ([facility] [name])    last seizure 1976  . Skin cancer   . Sleep apnea    ? to have study  after surgery  . Stroke [name])    ?tia ?afib  . TIA (transient ischemic attack)   . 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 ADENOIDECTOMY    . ANTERIOR CERVICAL DECOMP/DISCECTOMY FUSION N/A 02/04/2015   Procedure: ANTERIOR CERVICAL DECOMPRESSION/DISCECTOMY FUSION CERVICAL FIVE-SIX,CERVICAL SIX-SEVEN;  Surgeon: Karie Chimera, MD;  Location: [place] [facility];  Service: Neurosurgery;  Laterality: N/A;  . CATARACT EXTRACTION Bilateral   . COLONOSCOPY W/ POLYPECTOMY  04/19/2003   tubular adenoma  . GREAT TOE ARTHRODESIS, INTERPHALANGEAL JOINT Left 1978  . keratosis excision Right 05/1998   wrist  . nerve transfer surgery to lwft hand Left    Nov. 2017  . SHOULDER ARTHROSCOPY Left 13   rotator cuff   . SHOULDER INJECTION  June 2012   left, [place]  . SKIN CANCER EXCISION  07/1993   left lateral arm  . thumb injection  June 2012   left, [place]  . TONSILLECTOMY    . TOTAL SHOULDER ARTHROPLASTY Left 12/25/2016   Procedure: TOTAL SHOULDER ARTHROPLASTY;  Surgeon: Marchia Bond, MD;  Location: [place];  Service: Orthopedics;  Laterality: Left;  . ulnar nerve transfer  12/1999  left    Family History  Problem Relation Age of Onset  . Dementia Mother 18       vascular  . Transient ischemic attack Mother   . Dementia Father        after hip fracture  . Hypertension Sister        skin cancer  . Seizures Neg Hx     Social history:  reports that he quit smoking about 49 years ago. He quit after 15.00 years of use. He has never used smokeless tobacco. He reports that he drinks about 1.2 oz of alcohol per week . He reports that he does not use drugs.    Allergies  Allergen Reactions  . Maprotiline     TETRACYCLIC ANTIDEPRESSANTS UNSPECIFIED REACTION (patient is unaware of allergy)  . Tetracycline Hcl Itching and Rash    Rash on hands    Medications:  Prior to  Admission medications   Medication Sig Start Date End Date Taking? Authorizing Provider  amLODipine (NORVASC) 5 MG tablet Take 5 mg by mouth daily.   Yes [provider]  atorvastatin (LIPITOR) 10 MG tablet Take 10 mg by mouth daily.   Yes [provider]  baclofen (LIORESAL) 10 MG tablet Take 1 tablet (10 mg total) by mouth 3 (three) times daily. As needed for muscle spasm 12/25/16  Yes Marchia Bond, MD  ELIQUIS 5 MG TABS tablet TAKE 1 TABLET TWICE A DAY 03/26/17  Yes Kathrynn Ducking, MD  fluticasone [facility]) 50 MCG/ACT nasal spray Place 1 spray into both nostrils at bedtime as needed (for nasal congestion.).    Yes [provider]  gabapentin (NEURONTIN) 300 MG capsule One capsule twice during the day and 2 at night 12/17/16  Yes Kathrynn Ducking, MD  labetalol (NORMODYNE) 200 MG tablet Take 100 mg by mouth 2 (two) times daily.   Yes [provider]  losartan-hydrochlorothiazide (HYZAAR) 50-12.5 MG per tablet Take 1 tablet by mouth 2 (two) times daily.   Yes [provider]  meclizine (ANTIVERT) 25 MG tablet Take 1 tablet (25 mg total) by mouth 3 (three) times daily as needed. Patient taking differently: Take 25 mg by mouth 3 (three) times daily as needed for dizziness.  02/22/16  Yes Tanna Furry, MD  meloxicam (MOBIC) 15 MG tablet Take 15 mg by mouth. 03/29/17  Yes [provider]  methylPREDNISolone (MEDROL DOSEPAK) 4 MG TBPK tablet  03/29/17  Yes [provider]  Multiple Vitamin (MULTIVITAMIN WITH MINERALS) TABS tablet Take 1 tablet by mouth daily. Centrum Silver   Yes [provider]  Multiple Vitamins-Minerals (PRESERVISION AREDS 2) CAPS Take 1 capsule by mouth 2 (two) times daily.    Yes [provider]  Omega-3 Fatty Acids (FISH OIL) 1000 MG CAPS Take 1,000 mg by mouth 2 (two) times daily.    Yes [provider]  ondansetron (ZOFRAN) 4 MG tablet Take 1 tablet (4 mg total) by mouth every 8 (eight) hours  as needed for nausea or vomiting. 12/25/16  Yes Marchia Bond, MD  oxyCODONE-acetaminophen (PERCOCET) 10-325 MG tablet Take 1-2 tablets by mouth every 6 (six) hours as needed for pain. MAXIMUM TOTAL ACETAMINOPHEN DOSE IS 4000 MG PER DAY 12/25/16  Yes Marchia Bond, MD  Propylene Glycol (SYSTANE BALANCE) 0.6 % SOLN Place 1-2 drops into both eyes at bedtime.   Yes [provider]  sennosides-docusate sodium (SENOKOT-S) 8.6-50 MG tablet Take 2 tablets by mouth daily. Patient taking differently: Take 2 tablets by mouth  daily as needed.  12/25/16  Yes Marchia Bond, MD  traMADol (ULTRAM) 50 MG tablet Take 1 tablet (50 mg total) by mouth every 6 (six) hours as needed. 01/24/16  Yes Kathrynn Ducking, MD    ROS:  Out of a complete 14 system review of symptoms, the patient complains only of the following symptoms, and all other reviewed systems are negative.  Hearing loss Constipation Back pain, achy muscles, walking difficulty, neck pain, neck stiffness Numbness, weakness  Blood pressure 131/75, pulse (!) 55, height 6' (1.829 m), weight 226 lb 8 oz (102.7 kg).  Physical Exam  General: The patient is alert and cooperative at the time of the examination.  Skin: 1+ edema below the knees is noted bilaterally.   Neurologic Exam  Mental status: The patient is alert and oriented x 3 at the time of the examination. The patient has apparent normal recent and remote memory, with an apparently normal attention span and concentration ability.   Cranial nerves: Facial symmetry is present. Speech is normal, no aphasia or dysarthria is noted. Extraocular movements are full. Visual fields are full.  Motor: The patient has good strength in all 4 extremities, with exception of some weakness of intrinsic muscles of the left hand, atrophy of the left first dorsal interosseous muscle, possible mild left foot drop.  Sensory examination: Soft touch sensation is symmetric on the face, arms, and  legs.  Coordination: The patient has good finger-nose-finger and heel-to-shin bilaterally.  Gait and station: The patient has a slightly wide-based gait, the patient is able to ambulate independently. Tandem gait is unsteady. Romberg is negative. No drift is seen.  Reflexes: Deep tendon reflexes are symmetric, but are depressed.   Assessment/Plan:  1. Lumbosacral spinal stenosis  2. Peripheral neuropathy  3. Gait disturbance  The patient may be having neurogenic claudication involving the left leg. The patient does have significant spinal stenosis at the L2-3 level, we will perform nerve conduction studies of both legs and EMG on the left leg. The patient may require a surgical consultation.  Jill Alexanders MD 04/01/2017 4:45 PM  [facility] [address] [place], [place] [address]  Phone [phone] Fax [phone]

## 2017-04-01 NOTE — Telephone Encounter (Signed)
Patient has appointment with Dr. Jannifer Franklin on 06-18-17. He needs to be seen sooner because he has trouble walking and says his bowels have almost stopped working.

## 2017-04-01 NOTE — Therapy (Signed)
[facility] [address]  [place], [place], [address] Phone: [phone]   Fax:  [phone]  Physical Therapy Treatment  Patient Details  Name: Jose [name]. MRN: [id number] Date of Birth: 10/30/1935 Referring Provider: Dr. Mardelle Matte  Encounter Date: 04/01/2017      PT End of Session - 04/01/17 1139    Visit Number 23   Number of Visits 24   Date for PT Re-Evaluation 04/02/17   Authorization Type Medicare / Tricare - PT only   PT Start Time 1100   PT Stop Time 1129   PT Time Calculation (min) 29 min   Activity Tolerance Patient tolerated treatment well   Behavior During Therapy [facility] for tasks assessed/performed      Past Medical History:  Diagnosis Date  . Arthritis   . Dysrhythmia    afib  . Gallstones 07/1996  . Headache   . History of CT scan of head 1997   small vessel disease  . History of ETT 05/2002    no EKG changes  . History of MRI of cervical spine 07/15/2002  . Hypertension   . Lumbar spondylolysis 12/17/2016  . MGUS (monoclonal gammopathy of unknown significance) 04/11/2012  . Peripheral neuropathy   . Pneumonia 10/2014  . PONV (postoperative nausea and vomiting)    once many years ago  . Primary localized osteoarthrosis of left shoulder 12/25/2016  . Rectal bleeding 12/2002   colonoscopy  . Seizures ([place])    last seizure 1976  . Skin cancer   . Sleep apnea    ? to have study after surgery  . Stroke [facility])    ?tia ?afib  . TIA (transient ischemic attack)   . 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 ADENOIDECTOMY    . ANTERIOR CERVICAL DECOMP/DISCECTOMY FUSION N/A 02/04/2015   Procedure: ANTERIOR CERVICAL DECOMPRESSION/DISCECTOMY FUSION CERVICAL FIVE-SIX,CERVICAL SIX-SEVEN;  Surgeon: Karie Chimera, MD;  Location: [facility];  Service: Neurosurgery;  Laterality:  N/A;  . CATARACT EXTRACTION Bilateral   . COLONOSCOPY W/ POLYPECTOMY  04/19/2003   tubular adenoma  . GREAT TOE ARTHRODESIS, INTERPHALANGEAL JOINT Left 1978  . keratosis excision Right 05/1998   wrist  . nerve transfer surgery to lwft hand Left    Nov. 2017  . SHOULDER ARTHROSCOPY Left 13   rotator cuff   . SHOULDER INJECTION  June 2012   left, [place]  . SKIN CANCER EXCISION  07/1993   left lateral arm  . thumb injection  June 2012   left, [place]  . TONSILLECTOMY    . TOTAL SHOULDER ARTHROPLASTY Left 12/25/2016   Procedure: TOTAL SHOULDER ARTHROPLASTY;  Surgeon: Marchia Bond, MD;  Location: [facility];  Service: Orthopedics;  Laterality: Left;  . ulnar nerve transfer  12/1999   left    There were no vitals filed for this visit.      Subjective Assessment - 04/01/17 1103    Subjective Saw Dr. Mardelle Matte last Friday for hip hip pain. MD reporting spinal stenosis. Sees neurologist today for low back.    Patient Stated Goals Improve L shoulder function   Currently in Pain? No/denies   Pain Score 0-No pain            [facility] PT Assessment - 04/01/17 1107      AROM   Left Shoulder Flexion --  150 supine, 137 seated   Left Shoulder ABduction --  142 supine, 135 seated     PROM   Left Shoulder Flexion 155 Degrees   Left Shoulder ABduction 153 Degrees   Left Shoulder Internal Rotation 72 Degrees   Left Shoulder External Rotation 54 Degrees     Strength   Overall Strength Comments gross strength 3+ to 4- out of 5 - most difficulty with forward flexion and external rotation                     [facility] Adult PT Treatment/Exercise - 04/01/17 0001      Shoulder Exercises: ROM/Strengthening   UBE (Upper Arm Bike) Level 4 L UE only 3/3   Rhythmic Stabilization, Supine manual: distal humerus: 3 x 30 sec all directions     Manual Therapy   Manual Therapy Soft tissue mobilization   Manual therapy comments patient supine with bolster under knees   Soft tissue mobilization  deep STM to posterior shoulder complex    Passive ROM contract relax into ER x 5 reps                  PT Short Term Goals - 04/01/17 1435      PT SHORT TERM GOAL #1   Title Patient to be independent with intial stretching/strengthening HEP (03/05/17)   Status Achieved     PT SHORT TERM GOAL #2   Title Patient to improve L shoulder PROM: flexion 160, ER 75, and IR 80 - without pain limiting (03/05/17)   Status Partially Met     PT SHORT TERM GOAL #3   Title Patient to improve gross L shoulder strength to 3+/5 (03/05/17)   Status Achieved           PT Long Term Goals - 04/01/17 1436      PT LONG TERM GOAL #1   Title Patient to be independent with advanced HEP for L shoulder strengthening (04/02/17)   Status Achieved     PT LONG TERM GOAL #2   Title Patient to improve L shoulder AROM: flexion 160, ER 75, IR 80 wihtout pain limiting (04/02/17)   Status Partially Met     PT LONG TERM GOAL #3   Title Patient to demonstrate ability to perform ADLs and basic household chores without pain or functional limatations (04/02/17)   Status Achieved     PT LONG TERM GOAL #4   Title Patient to improve L shoulder strength to >/= 4+/5 (04/02/17)   Status Not Met               Plan - 04/01/17 1436    Clinical Impression Statement Patient today presenting to last treatment session for review of HEP for stretching, strengthening and general improved function at L shoulder. Patient has met or partially met all goals other than strength goal, which will likely conitnue to improve as patient begins to utilize L shoulder more frequently with daily life. Patient and PT both agreeable to discharge at this time, as both parties feel as if patient can perform all home exercises independently with good compliance. Patient very please with current functional progress.    PT Treatment/Interventions ADLs/Self Care Home Management;Cryotherapy;Electrical Stimulation;Moist Heat;Ultrasound;Therapeutic  exercise;Therapeutic activities;Patient/family education;Manual techniques;Scar mobilization;Passive range of motion;Vasopneumatic Device;Taping;Dry needling;Neuromuscular re-education   PT Next Visit Plan d/c this visit   Consulted and Agree with Plan of Care Patient      Patient will benefit from skilled therapeutic intervention in order  to improve the following deficits and impairments:  Pain, Impaired UE functional use, Decreased strength, Decreased range of motion, Decreased activity tolerance, Decreased endurance  Visit Diagnosis: Acute pain of left shoulder  Stiffness of left shoulder, not elsewhere classified  Muscle weakness (generalized)  Abnormal posture       G-Codes - April 06, 2017 1541    Functional Assessment Tool Used (Outpatient Only) FOTO: 67 (33% limited)   Functional Limitation Carrying, moving and handling objects   Carrying, Moving and Handling Objects Goal Status ([id number]) At least 20 percent but less than 40 percent impaired, limited or restricted   Carrying, Moving and Handling Objects Discharge Status [phone]) At least 20 percent but less than 40 percent impaired, limited or restricted      Problem List Patient Active Problem List   Diagnosis Date Noted  . Primary localized osteoarthrosis of left shoulder 12/25/2016  . S/P shoulder replacement 12/25/2016  . Lumbar spondylolysis 12/17/2016  . Paresthesia 01/24/2016  . 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 extremity 03/29/2015  . Cervical spinal stenosis 02/04/2015  . Bronchiectasis without acute exacerbation ([place]) 01/11/2015  . Ulnar neuropathy at elbow 12/24/2014  . TIA (transient ischemic attack) 12/15/2014  . Hyperlipidemia 11/24/2014  . Hypoxia   . New onset a-fib ([place])   . MGUS (monoclonal gammopathy of unknown significance) 04/11/2012  . SEBORRHEIC KERATOSIS 11/09/2010  . CPK, ABNORMAL 09/03/2010  . DERMATOPHYTOSIS OF FOOT 08/03/2010  . MYALGIA 12/27/2009  . GOUT, UNSPECIFIED 10/04/2009  .  HYPERTROPHY PROSTATE W/O UR OBST & OTH LUTS 12/14/2008  . ALLERGIC CONJUNCTIVITIS 12/03/2007  . TESTOSTERONE DEFICIENCY 11/14/2007  . Backache 04/22/2007  . HYPERLIPIDEMIA 12/26/2006  . OBESITY, NOS 12/26/2006  . 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 Hereditary and idiopathic peripheral neuropathy 12/26/2006  . HEARING LOSS NOS OR DEAFNESS 12/26/2006  . HYPERTENSION, BENIGN SYSTEMIC 12/26/2006  . OSTEOARTHRITIS, MULTI SITES 12/26/2006  . Musculoskeletal disorder and symptoms referable to neck 12/26/2006     Lanney Gins, PT, DPT 04/06/2017 3:42 PM   PHYSICAL THERAPY DISCHARGE SUMMARY  Visits from Start of Care: 23  Current functional level related to goals / functional outcomes: See above   Remaining deficits: See above, slight deficits in ROM and strength   Education / Equipment: HEP  Plan: Patient agrees to discharge.  Patient goals were partially met. Patient is being discharged due to being pleased with the current functional level.  ?????    Lanney Gins, PT, DPT 04-06-17 3:43 PM   [facility] [address]  [place], [place], [address] Phone: [phone]   Fax:  [phone]  Name: Iniko Robles. MRN: [id number] Date of Birth: 07-05-1936

## 2017-04-24 ENCOUNTER — Ambulatory Visit (INDEPENDENT_AMBULATORY_CARE_PROVIDER_SITE_OTHER): Payer: Medicare Other | Admitting: Neurology

## 2017-04-24 ENCOUNTER — Encounter: Payer: Self-pay | Admitting: Neurology

## 2017-04-24 ENCOUNTER — Ambulatory Visit (INDEPENDENT_AMBULATORY_CARE_PROVIDER_SITE_OTHER): Payer: Self-pay | Admitting: Neurology

## 2017-04-24 DIAGNOSIS — M4306 Spondylolysis, lumbar region: Secondary | ICD-10-CM

## 2017-04-24 DIAGNOSIS — G609 Hereditary and idiopathic neuropathy, unspecified: Secondary | ICD-10-CM

## 2017-04-24 NOTE — Procedures (Signed)
     HISTORY:  Jose [name] is a 81 year old gentleman with a history of a peripheral neuropathy associated with MGUS. The patient has developed some discomfort in the back and left hip that comes on with walking, he is restricted with his ability to walk more than 100 yards. MRI of the lumbar spine is shown evidence of significant spinal stenosis at the L2-3 level. The patient is being evaluated for this worsening gait problem.  NERVE CONDUCTION STUDIES:  Nerve conduction studies were performed on both lower extremities. The distal motor latencies for the peroneal and posterior tibial nerves were within normal limits bilaterally with low motor amplitudes for these nerves bilaterally. Slowing was seen for the peroneal and posterior tibial nerves bilaterally. The sural and peroneal sensory latencies were unobtainable bilaterally, and the H reflex latencies were unobtainable bilaterally. The F wave latencies for the posterior tibial nerves were prolonged bilaterally.  EMG STUDIES:  EMG study was performed on the left lower extremity:  The tibialis anterior muscle reveals 2 to 5K motor units with decresed recruitment. 1+ positive waves were seen. The peroneus tertius muscle reveals 2 to 4K motor units with decreased recruitment. 2+ positive waves were seen. The medial gastrocnemius muscle reveals no voluntary motor units with no recruitment. No fibrillations or positive waves were seen. The vastus lateralis muscle reveals 2 to 4K motor units with slightly decreased recruitment. No fibrillations or positive waves were seen. The iliopsoas muscle reveals 2 to 4K motor units with slightly decreased recruitment. No fibrillations or positive waves were seen. The biceps femoris muscle (long head) reveals 2 to 3K motor units with slightly decreased recruitment. 1+ positive waves were seen. The lumbosacral paraspinal muscles were tested at 3 levels, and revealed no abnormalities of insertional activity at  the middle level tested. 1+ positive waves are seen in the upper level, 2+ at the lower level. There was good relaxation.   IMPRESSION:  Nerve conduction studies done on both lower extremities shows evidence of a moderate to severe primarily axonal peripheral neuropathy. EMG evaluation of the left lower extremity shows distal chronic and acute denervation consistent with the diagnosis of peripheral neuropathy, but there also appears to be changes consistent with an L5 and possible S1 acute and chronic radiculopathy as well.  Jill Alexanders MD 04/24/2017 1:46 PM  [facility] [address] [place], [place] [address]  Phone [phone] Fax [phone]

## 2017-04-24 NOTE — Progress Notes (Signed)
Please refer to EMG nerve conduction study procedure note. 

## 2017-04-24 NOTE — Progress Notes (Signed)
The patient comes in for EMG and nerve conduction study evaluation today. Nerve conduction study shows a relatively severe peripheral neuropathy that is axonal in nature. There appears to be evidence of an overlying acute L5 radiculopathy, possible involvement of the S1 level as well. The patient has significant spinal stenosis at L2-3 level, he has symptoms consistent with neurogenic claudication.  I will refer the patient to neurosurgery for further assessment.

## 2017-04-25 ENCOUNTER — Telehealth: Payer: Self-pay | Admitting: Neurology

## 2017-04-25 NOTE — Telephone Encounter (Signed)
Patient called office in reference to NCV/EMG done yesterday patient states he is having lower back nerve pain along with trouble walking (left leg).  Patient has meloxicam 15mg  and would like to know if this medication will help with the discomfort and nerve pain.  Please call

## 2017-04-25 NOTE — Telephone Encounter (Signed)
Rn call patient about having trouble walking, and having lower back pain. Pt stated he notice a mild increase in pain since having the EMG/NCV yesterday. Rn stated pain can be common after having a EMG/NCv. Rn stated per Dr. Jannifer Franklin office note, this is a chronic issue with walking, and having left hip pain. Rn also stated Dr. Jannifer Franklin discuss possible surgical consult. Rn ask patient if he was using a walker or cane for mobility. Pt stated he is not using any DME device at the moment. He does have a cane if needed. Pt explain to nurse he had shoulder surgery in the last year. Pt stated he has meloxicam, tramadol, and oxycodone prescribed to him.  Rn explain to patient to choose one of the medications to help with the pain until the results are read. Pt stated he is not a fan of pain medicine,but will take it to function daily. Pt stated he has taken the tramadol, and it has alleviate some of the his nerve issues and pain.  Rn educated patient on not taking multiple pain medicines at the same time. Pt stated he will not mix the medication or take all of them in one day. Pt stated he will take the tramadol today so he can have some relief. RN recommend pt try to avoid falling, and use his cane if needed.PT will wait on test results from Dr. Jannifer Franklin. Pt verbalized understanding.

## 2017-05-13 NOTE — Addendum Note (Signed)
Addendum  created 05/13/17 1711 by Annye Asa, MD   Sign clinical note

## 2017-05-15 MED FILL — MELOXICAM 15 MG TABLET: 15 | 30 days supply | Qty: 30 | Fill #0

## 2017-05-29 ENCOUNTER — Other Ambulatory Visit: Payer: Self-pay | Admitting: Neurosurgery

## 2017-06-05 ENCOUNTER — Telehealth: Payer: Self-pay | Admitting: *Deleted

## 2017-06-05 NOTE — Telephone Encounter (Signed)
Faxed signed surgery clearance for patient to stop eliquis 3 days prior to procedure: laminectomy/foraminotomy L2-4 back to [facility]. Fax: [phone]. Received confirmation.

## 2017-06-18 ENCOUNTER — Ambulatory Visit: Payer: Medicare Other | Admitting: Neurology

## 2017-07-08 MED FILL — MELOXICAM 15 MG TABLET: 15 | 30 days supply | Qty: 30 | Fill #1

## 2017-07-15 NOTE — Pre-Procedure Instructions (Signed)
Jose Skeans Jr.  07/15/2017      [place] [address] Phone: [phone] Fax: [phone]  [facility] [employer] - [place] [address], [place] [address] Phone: [phone] Fax: [place], [place] [address] Phone: [phone] Fax: [phone]  [facility] [place], [place] [address] [place] [address] Phone: [phone] Fax: [phone]    Your procedure is scheduled on July 19, 2017.  Report to [facility] Admitting at 07:00 A.M.  Call this number if you have problems the morning of surgery:  [phone]   Call [phone] if you have any questions prior to your surgery date Monday-Friday 8am-4pm   Remember:  Do not eat food or drink liquids after midnight.  Take these medicines the morning of surgery with A SIP OF WATER:  Amlodipine (Norvasc) Gabapentin (Neurontin) Labetalol (Normdodyne)   STOP taking Eliquis as directed by your Doctor.  7 days prior to surgery STOP taking any Mobic/Melixicam, Aspirin, Aleve, Naproxen, Ibuprofen, Motrin, Advil, Goody's, BC's, all herbal medications, fish oil, and all vitamins.  Please bring your CPAP mask with you.    Do not wear jewelry, make-up or nail polish.  Do not wear lotions, powders, or perfumes, or deodorant.  Do not shave 48 hours prior to surgery.  Men may shave face and neck.  Do not bring valuables to the hospital.  [facility] is not responsible for any belongings or valuables.  Contacts, dentures or bridgework may not be worn into surgery.  Leave your suitcase in the car.  After surgery it may be brought to your room.  For patients admitted to the hospital, discharge time will be determined  by your treatment team.  Patients discharged the day of surgery will not be allowed to drive home.   Special instructions:   [facility]- Preparing For Surgery  Before surgery, you can play an important role. Because skin is not sterile, your skin needs to be as free of germs as possible. You can reduce the number of germs on your skin by washing with CHG (chlorahexidine gluconate) Soap before surgery.  CHG is an antiseptic cleaner which kills germs and bonds with the skin to continue killing germs even after washing.  Please do not use if you have an allergy to CHG or antibacterial soaps. If your skin becomes reddened/irritated stop using the CHG.  Do not shave (including legs and underarms) for at least 48 hours prior to first CHG shower. It is OK to shave your face.  Please follow these instructions carefully.   1. Shower the NIGHT BEFORE SURGERY and the MORNING OF SURGERY with CHG.   2. If you chose to wash your hair, wash your hair first as usual with your normal shampoo.  3. After you shampoo, rinse your hair and body thoroughly to remove the shampoo.  4. Use CHG as you would any other liquid soap. You can apply CHG directly to the skin and wash gently with a scrungie or a clean washcloth.   5. Apply the CHG Soap to your body ONLY FROM THE NECK DOWN.  Do not use on open wounds or open sores. Avoid contact  with your eyes, ears, mouth and genitals (private parts). Wash genitals (private parts) with your normal soap.  6. Wash thoroughly, paying special attention to the area where your surgery will be performed.  7. Thoroughly rinse your body with warm water from the neck down.  8. DO NOT shower/wash with your normal soap after using and rinsing off the CHG Soap.  9. Pat yourself dry with a CLEAN TOWEL.   10. Wear CLEAN PAJAMAS   11. Place CLEAN SHEETS on your bed the night of your first shower and DO NOT SLEEP WITH PETS.    Day of Surgery: Do not apply any  deodorants/lotions. Please wear clean clothes to the hospital/surgery center.      Please read over the following fact sheets that you were given.

## 2017-07-16 ENCOUNTER — Encounter (HOSPITAL_COMMUNITY)
Admission: RE | Admit: 2017-07-16 | Discharge: 2017-07-16 | Disposition: A | Discharge status: Home / Self Care | Payer: Medicare Other | Source: Ambulatory Visit | Attending: Neurosurgery | Admitting: Neurosurgery

## 2017-07-16 ENCOUNTER — Encounter (HOSPITAL_COMMUNITY): Payer: Self-pay

## 2017-07-16 DIAGNOSIS — D472 Monoclonal gammopathy: Secondary | ICD-10-CM | POA: Insufficient documentation

## 2017-07-16 DIAGNOSIS — G4733 Obstructive sleep apnea (adult) (pediatric): Secondary | ICD-10-CM

## 2017-07-16 DIAGNOSIS — Z79899 Other long term (current) drug therapy: Secondary | ICD-10-CM

## 2017-07-16 DIAGNOSIS — I4891 Unspecified atrial fibrillation: Secondary | ICD-10-CM | POA: Insufficient documentation

## 2017-07-16 DIAGNOSIS — G4730 Sleep apnea, unspecified: Secondary | ICD-10-CM | POA: Diagnosis not present

## 2017-07-16 DIAGNOSIS — Z87891 Personal history of nicotine dependence: Secondary | ICD-10-CM | POA: Diagnosis not present

## 2017-07-16 DIAGNOSIS — Z01812 Encounter for preprocedural laboratory examination: Secondary | ICD-10-CM | POA: Insufficient documentation

## 2017-07-16 DIAGNOSIS — I10 Essential (primary) hypertension: Secondary | ICD-10-CM

## 2017-07-16 DIAGNOSIS — Z9989 Dependence on other enabling machines and devices: Secondary | ICD-10-CM | POA: Diagnosis not present

## 2017-07-16 DIAGNOSIS — M48062 Spinal stenosis, lumbar region with neurogenic claudication: Secondary | ICD-10-CM | POA: Diagnosis not present

## 2017-07-16 DIAGNOSIS — Z85828 Personal history of other malignant neoplasm of skin: Secondary | ICD-10-CM

## 2017-07-16 DIAGNOSIS — Z8673 Personal history of transient ischemic attack (TIA), and cerebral infarction without residual deficits: Secondary | ICD-10-CM | POA: Diagnosis not present

## 2017-07-16 HISTORY — DX: Spinal stenosis, lumbar region with neurogenic claudication: M48.062

## 2017-07-16 LAB — BASIC METABOLIC PANEL
Anion gap: 7 (ref 5–15)
BUN: 19 mg/dL (ref 6–20)
CALCIUM: 9.7 mg/dL (ref 8.9–10.3)
CHLORIDE: 103 mmol/L (ref 101–111)
CO2: 29 mmol/L (ref 22–32)
CREATININE: 0.84 mg/dL (ref 0.61–1.24)
GFR calc non Af Amer: >60 mL/min (ref >60)
Glucose, Bld: 122 mg/dL — ABNORMAL HIGH (ref 65–99)
Potassium: 3.5 mmol/L (ref 3.5–5.1)
SODIUM: 139 mmol/L (ref 135–145)

## 2017-07-16 LAB — CBC
HCT: 38.6 % — ABNORMAL LOW (ref 39.0–52.0)
Hemoglobin: 12.7 g/dL — ABNORMAL LOW (ref 13.0–17.0)
MCH: 30.8 pg (ref 26.0–34.0)
MCHC: 32.9 g/dL (ref 30.0–36.0)
MCV: 93.5 fL (ref 78.0–100.0)
PLATELETS: 205 10*3/uL (ref 150–400)
RBC: 4.13 MIL/uL — AB (ref 4.22–5.81)
RDW: 13.6 % (ref 11.5–15.5)
WBC: 5.7 10*3/uL (ref 4.0–10.5)

## 2017-07-16 LAB — SURGICAL PCR SCREEN
MRSA, PCR: NEGATIVE
Staphylococcus aureus: NEGATIVE

## 2017-07-16 NOTE — Progress Notes (Signed)
[facility]  [facility]  Chest x-ray - Denies  EKG - 08/09/16-Requested  Stress Test - Can't recall-Requested  ECHO - 09/2014 (E)  Cardiac Cath - Denies  Sleep Study - Yes CPAP - Yes  PT will be drawn DOS. Pt sts his last dose of Eliquis was 07/15/17.  Chart will be given to anesthesia for review due to cardiac history.   Pt denies having chest pain, sob, or fever at this time. All instructions explained to the pt, with a verbal understanding of the material. Pt agrees to go over the instructions while at home for a better understanding. The opportunity to ask questions was provided.

## 2017-07-17 NOTE — Progress Notes (Signed)
Anesthesia Chart Review:  Pt is a 81 year old male scheduled for l2-3, L3-4 laminectomy and foraminotomy on 07/19/2017 with Ashok Pall, M.D.  - Cardiologist is Gwenith Spitz, MD last office visit 08/09/16; f./u in 1 year recommended (notes in care everywhere) - PCP is Charlcie Cradle, MD, last office visit 01/23/17 (notes in care everywhere) - Neurologist is Margette Fast, MD, last office visit 04/24/17 - Pulmonologist Simonne Maffucci, MD  PMH includes:  Atrial fibrillation, HTN, MGUS, skin cancer, seizures (last '76), TIA (10/13/14: work-up revealed PAF), OSA, post-op N/V. Former smoker. S/p L shoulder arthroplasty 12/25/16. S/p ACDF 02/04/15.   Medications include: amlodipine, lipitor, eliquis, labetalol, losartan-hctz. Pt to stop eliquis 3 days before surgery.   Preoperative labs reviewed.  PT will be obtained DOS.  EKG 08/09/16 requested from cardiologist's office.   11/10/14 - 12/06/14 Event monitor: Average HR 71 BPM. Tachycardia present 4%, bradycardia 20%. Afib 3% with fastest episode 143 bpm and longest episode 03:34:44. No pauses noted > 3 seconds.  10/13/14 Echo: Left ventricle: The cavity size was normal. Systolic function was normal. The estimated ejection fraction was in the range of 50%to 55%. Wall motion was normal; there were no regional wall motion abnormalities. Left ventricular diastolic function parameters were normal. Mild pulmonic regurgitation.  10/13/14 Carotid duplex:  - The vertebral arteries appear patent with antegrade flow. - Findings consistent with 1-39 percent cerebrovascular disease involving the B ICA.   Treadmill stress test 03/30/14 ([facility]): 1. Negative for ischemia patient achieved adequate or target heart rate. 2. Fair exercise tolerance, 8.6 METS. 3. Resting hypertension with normal response to exercise.  11/02/14 EEG: Impression: This is a normal EEG recording in the waking state. No evidence of ictal or interictal discharges are  seen.  If no changes, I anticipate pt can proceed with surgery as scheduled.   Willeen Cass, FNP-BC [facility]Anesthesiology Phone: [phone] 07/17/2017 11:39 AM

## 2017-07-18 ENCOUNTER — Encounter (HOSPITAL_COMMUNITY): Payer: Self-pay | Admitting: Anesthesiology

## 2017-07-19 ENCOUNTER — Observation Stay (HOSPITAL_COMMUNITY)
Admission: RE | Admit: 2017-07-19 | Discharge: 2017-07-20 | Disposition: A | Discharge status: Home / Self Care | Payer: Medicare Other | Source: Ambulatory Visit | Attending: Neurosurgery | Admitting: Neurosurgery

## 2017-07-19 ENCOUNTER — Ambulatory Visit (HOSPITAL_COMMUNITY): Payer: Medicare Other

## 2017-07-19 ENCOUNTER — Encounter (HOSPITAL_COMMUNITY): Payer: Self-pay | Admitting: *Deleted

## 2017-07-19 ENCOUNTER — Ambulatory Visit (HOSPITAL_COMMUNITY): Payer: Medicare Other | Admitting: Anesthesiology

## 2017-07-19 ENCOUNTER — Ambulatory Visit (HOSPITAL_COMMUNITY): Payer: Medicare Other | Admitting: Emergency Medicine

## 2017-07-19 ENCOUNTER — Encounter (HOSPITAL_COMMUNITY)
Admission: RE | Disposition: A | Discharge status: Home / Self Care | Payer: Self-pay | Source: Ambulatory Visit | Attending: Neurosurgery

## 2017-07-19 DIAGNOSIS — M48062 Spinal stenosis, lumbar region with neurogenic claudication: Principal | ICD-10-CM | POA: Diagnosis present

## 2017-07-19 DIAGNOSIS — Z87891 Personal history of nicotine dependence: Secondary | ICD-10-CM | POA: Insufficient documentation

## 2017-07-19 DIAGNOSIS — Z9989 Dependence on other enabling machines and devices: Secondary | ICD-10-CM | POA: Insufficient documentation

## 2017-07-19 DIAGNOSIS — G4730 Sleep apnea, unspecified: Secondary | ICD-10-CM | POA: Diagnosis not present

## 2017-07-19 DIAGNOSIS — I10 Essential (primary) hypertension: Secondary | ICD-10-CM | POA: Insufficient documentation

## 2017-07-19 DIAGNOSIS — Z419 Encounter for procedure for purposes other than remedying health state, unspecified: Secondary | ICD-10-CM

## 2017-07-19 DIAGNOSIS — Z8673 Personal history of transient ischemic attack (TIA), and cerebral infarction without residual deficits: Secondary | ICD-10-CM | POA: Insufficient documentation

## 2017-07-19 HISTORY — PX: LUMBAR LAMINECTOMY/DECOMPRESSION MICRODISCECTOMY: SHX5026

## 2017-07-19 LAB — PROTIME-INR
INR: 1.23
PROTHROMBIN TIME: 15.4 s — AB (ref 11.4–15.2)

## 2017-07-19 SURGERY — LUMBAR LAMINECTOMY/DECOMPRESSION MICRODISCECTOMY 2 LEVELS
Anesthesia: General | Site: Back

## 2017-07-19 MED ORDER — EPHEDRINE 5 MG/ML INJ
INTRAVENOUS | Status: AC
  Filled 2017-07-19: qty 10

## 2017-07-19 MED ORDER — DEXAMETHASONE SODIUM PHOSPHATE 10 MG/ML IJ SOLN
INTRAMUSCULAR | Status: AC
  Filled 2017-07-19: qty 1

## 2017-07-19 MED ORDER — POLYVINYL ALCOHOL 1.4 % OP SOLN
1.0000 [drp] | Freq: Every day | OPHTHALMIC | Status: DC
  Administered 2017-07-19: 2 [drp] via OPHTHALMIC
  Filled 2017-07-19: qty 15

## 2017-07-19 MED ORDER — GABAPENTIN 300 MG PO CAPS
300.0000 mg | ORAL_CAPSULE | Freq: Two times a day (BID) | ORAL | Status: DC
  Administered 2017-07-19 – 2017-07-20 (×2): 300 mg via ORAL
  Filled 2017-07-19 (×2): qty 1

## 2017-07-19 MED ORDER — LOSARTAN POTASSIUM-HCTZ 50-12.5 MG PO TABS: 1.0000 | ORAL_TABLET | Freq: Two times a day (BID) | ORAL | Status: DC

## 2017-07-19 MED ORDER — SENNOSIDES-DOCUSATE SODIUM 8.6-50 MG PO TABS
2.0000 | ORAL_TABLET | Freq: Every day | ORAL | Status: DC
  Filled 2017-07-19: qty 2

## 2017-07-19 MED ORDER — ONDANSETRON HCL 4 MG PO TABS: 4.0000 mg | ORAL_TABLET | Freq: Three times a day (TID) | ORAL | Status: DC | PRN

## 2017-07-19 MED ORDER — LIDOCAINE HCL (CARDIAC) 20 MG/ML IV SOLN
INTRAVENOUS | Status: DC | PRN
  Administered 2017-07-19: 30 mg via INTRAVENOUS

## 2017-07-19 MED ORDER — MENTHOL 3 MG MT LOZG: 1.0000 | LOZENGE | OROMUCOSAL | Status: DC | PRN

## 2017-07-19 MED ORDER — THROMBIN 5000 UNITS EX SOLR
CUTANEOUS | Status: DC | PRN
  Administered 2017-07-19 (×2): 5000 [IU] via TOPICAL

## 2017-07-19 MED ORDER — AMLODIPINE BESYLATE 5 MG PO TABS
5.0000 mg | ORAL_TABLET | Freq: Every day | ORAL | Status: DC
  Administered 2017-07-20: 5 mg via ORAL
  Filled 2017-07-19: qty 1

## 2017-07-19 MED ORDER — LACTATED RINGERS IV SOLN
INTRAVENOUS | Status: DC | PRN
  Administered 2017-07-19 (×2): via INTRAVENOUS

## 2017-07-19 MED ORDER — ROCURONIUM BROMIDE 10 MG/ML (PF) SYRINGE
PREFILLED_SYRINGE | INTRAVENOUS | Status: AC
  Filled 2017-07-19: qty 5

## 2017-07-19 MED ORDER — BUPIVACAINE HCL (PF) 0.5 % IJ SOLN
INTRAMUSCULAR | Status: DC | PRN
  Administered 2017-07-19: 30 mL

## 2017-07-19 MED ORDER — ZOLPIDEM TARTRATE 5 MG PO TABS: 5.0000 mg | ORAL_TABLET | Freq: Every evening | ORAL | Status: DC | PRN

## 2017-07-19 MED ORDER — LOSARTAN POTASSIUM 50 MG PO TABS
50.0000 mg | ORAL_TABLET | Freq: Two times a day (BID) | ORAL | Status: DC
  Administered 2017-07-19 – 2017-07-20 (×2): 50 mg via ORAL
  Filled 2017-07-19 (×2): qty 1

## 2017-07-19 MED ORDER — OXYCODONE HCL ER 10 MG PO T12A
10.0000 mg | EXTENDED_RELEASE_TABLET | Freq: Two times a day (BID) | ORAL | Status: DC
  Administered 2017-07-19: 10 mg via ORAL
  Filled 2017-07-19 (×2): qty 1

## 2017-07-19 MED ORDER — LABETALOL HCL 100 MG PO TABS
100.0000 mg | ORAL_TABLET | Freq: Two times a day (BID) | ORAL | Status: DC
  Administered 2017-07-19 – 2017-07-20 (×2): 100 mg via ORAL
  Filled 2017-07-19 (×2): qty 1

## 2017-07-19 MED ORDER — FENTANYL CITRATE (PF) 100 MCG/2ML IJ SOLN
INTRAMUSCULAR | Status: DC | PRN
  Administered 2017-07-19: 150 ug via INTRAVENOUS
  Administered 2017-07-19: 100 ug via INTRAVENOUS

## 2017-07-19 MED ORDER — SODIUM CHLORIDE 0.9% FLUSH: 3.0000 mL | Freq: Two times a day (BID) | INTRAVENOUS | Status: DC

## 2017-07-19 MED ORDER — BUPIVACAINE HCL (PF) 0.5 % IJ SOLN
INTRAMUSCULAR | Status: AC
  Filled 2017-07-19: qty 30

## 2017-07-19 MED ORDER — SUGAMMADEX SODIUM 200 MG/2ML IV SOLN
INTRAVENOUS | Status: DC | PRN
  Administered 2017-07-19: 204.2 mg via INTRAVENOUS

## 2017-07-19 MED ORDER — POTASSIUM CHLORIDE IN NACL 20-0.9 MEQ/L-% IV SOLN: INTRAVENOUS | Status: DC

## 2017-07-19 MED ORDER — ATORVASTATIN CALCIUM 20 MG PO TABS
10.0000 mg | ORAL_TABLET | Freq: Every day | ORAL | Status: DC
  Administered 2017-07-20: 10 mg via ORAL
  Filled 2017-07-19: qty 1

## 2017-07-19 MED ORDER — PRESERVISION AREDS 2 PO CAPS: 1.0000 | ORAL_CAPSULE | Freq: Two times a day (BID) | ORAL | Status: DC

## 2017-07-19 MED ORDER — ONDANSETRON HCL 4 MG/2ML IJ SOLN: 4.0000 mg | Freq: Four times a day (QID) | INTRAMUSCULAR | Status: DC | PRN

## 2017-07-19 MED ORDER — LACTATED RINGERS IV SOLN
INTRAVENOUS | Status: DC
Start: 2017-07-19 — End: 2017-07-19
  Administered 2017-07-19: 08:00:00 via INTRAVENOUS

## 2017-07-19 MED ORDER — ONDANSETRON HCL 4 MG/2ML IJ SOLN
INTRAMUSCULAR | Status: AC
  Filled 2017-07-19: qty 2

## 2017-07-19 MED ORDER — ADULT MULTIVITAMIN W/MINERALS CH
1.0000 | ORAL_TABLET | Freq: Every day | ORAL | Status: DC
  Administered 2017-07-19: 1 via ORAL
  Filled 2017-07-19: qty 1

## 2017-07-19 MED ORDER — EPHEDRINE SULFATE 50 MG/ML IJ SOLN
INTRAMUSCULAR | Status: DC | PRN
  Administered 2017-07-19: 15 mg via INTRAVENOUS

## 2017-07-19 MED ORDER — SUGAMMADEX SODIUM 500 MG/5ML IV SOLN
INTRAVENOUS | Status: AC
  Filled 2017-07-19: qty 5

## 2017-07-19 MED ORDER — DEXTROSE 5 % IV SOLN
INTRAVENOUS | Status: DC | PRN
  Administered 2017-07-19: 50 ug/min via INTRAVENOUS

## 2017-07-19 MED ORDER — GABAPENTIN 300 MG PO CAPS
600.0000 mg | ORAL_CAPSULE | Freq: Every day | ORAL | Status: DC
  Administered 2017-07-19: 600 mg via ORAL
  Filled 2017-07-19: qty 2

## 2017-07-19 MED ORDER — ACETAMINOPHEN 650 MG RE SUPP: 650.0000 mg | RECTAL | Status: DC | PRN

## 2017-07-19 MED ORDER — ONDANSETRON HCL 4 MG/2ML IJ SOLN
INTRAMUSCULAR | Status: DC | PRN
  Administered 2017-07-19: 4 mg via INTRAVENOUS

## 2017-07-19 MED ORDER — PHENOL 1.4 % MT LIQD: 1.0000 | OROMUCOSAL | Status: DC | PRN

## 2017-07-19 MED ORDER — PROPOFOL 10 MG/ML IV BOLUS
INTRAVENOUS | Status: AC
  Filled 2017-07-19: qty 40

## 2017-07-19 MED ORDER — BACLOFEN 10 MG PO TABS: 10.0000 mg | ORAL_TABLET | Freq: Three times a day (TID) | ORAL | Status: DC

## 2017-07-19 MED ORDER — ACETAMINOPHEN 500 MG PO TABS
1000.0000 mg | ORAL_TABLET | Freq: Four times a day (QID) | ORAL | Status: DC
  Administered 2017-07-19: 1000 mg via ORAL
  Filled 2017-07-19: qty 2

## 2017-07-19 MED ORDER — ACETAMINOPHEN 325 MG PO TABS: 650.0000 mg | ORAL_TABLET | ORAL | Status: DC | PRN

## 2017-07-19 MED ORDER — SODIUM CHLORIDE 0.9% FLUSH: 3.0000 mL | INTRAVENOUS | Status: DC | PRN

## 2017-07-19 MED ORDER — ROCURONIUM BROMIDE 100 MG/10ML IV SOLN
INTRAVENOUS | Status: DC | PRN
  Administered 2017-07-19: 60 mg via INTRAVENOUS

## 2017-07-19 MED ORDER — LIDOCAINE-EPINEPHRINE 0.5 %-1:200000 IJ SOLN
INTRAMUSCULAR | Status: DC | PRN
  Administered 2017-07-19: 10 mL

## 2017-07-19 MED ORDER — PHENYLEPHRINE 40 MCG/ML (10ML) SYRINGE FOR IV PUSH (FOR BLOOD PRESSURE SUPPORT)
PREFILLED_SYRINGE | INTRAVENOUS | Status: AC
  Filled 2017-07-19: qty 10

## 2017-07-19 MED ORDER — GABAPENTIN 300 MG PO CAPS: 300.0000 mg | ORAL_CAPSULE | Freq: Three times a day (TID) | ORAL | Status: DC

## 2017-07-19 MED ORDER — PROPOFOL 10 MG/ML IV BOLUS
INTRAVENOUS | Status: DC | PRN
  Administered 2017-07-19: 160 mg via INTRAVENOUS

## 2017-07-19 MED ORDER — CHLORHEXIDINE GLUCONATE CLOTH 2 % EX PADS: 6.0000 | MEDICATED_PAD | Freq: Once | CUTANEOUS | Status: DC

## 2017-07-19 MED ORDER — ONDANSETRON HCL 4 MG PO TABS: 4.0000 mg | ORAL_TABLET | Freq: Four times a day (QID) | ORAL | Status: DC | PRN

## 2017-07-19 MED ORDER — DIAZEPAM 5 MG PO TABS: 5.0000 mg | ORAL_TABLET | Freq: Four times a day (QID) | ORAL | Status: DC | PRN

## 2017-07-19 MED ORDER — 0.9 % SODIUM CHLORIDE (POUR BTL) OPTIME
TOPICAL | Status: DC | PRN
  Administered 2017-07-19: 1000 mL

## 2017-07-19 MED ORDER — FENTANYL CITRATE (PF) 250 MCG/5ML IJ SOLN
INTRAMUSCULAR | Status: AC
  Filled 2017-07-19: qty 5

## 2017-07-19 MED ORDER — HEMOSTATIC AGENTS (NO CHARGE) OPTIME
TOPICAL | Status: DC | PRN
  Administered 2017-07-19: 1 via TOPICAL

## 2017-07-19 MED ORDER — LIDOCAINE-EPINEPHRINE 0.5 %-1:200000 IJ SOLN
INTRAMUSCULAR | Status: AC
  Filled 2017-07-19: qty 1

## 2017-07-19 MED ORDER — MECLIZINE HCL 12.5 MG PO TABS: 25.0000 mg | ORAL_TABLET | Freq: Three times a day (TID) | ORAL | Status: DC | PRN

## 2017-07-19 MED ORDER — DEXAMETHASONE SODIUM PHOSPHATE 10 MG/ML IJ SOLN
INTRAMUSCULAR | Status: DC | PRN
  Administered 2017-07-19: 10 mg via INTRAVENOUS

## 2017-07-19 MED ORDER — OXYCODONE HCL 5 MG PO TABS
5.0000 mg | ORAL_TABLET | ORAL | Status: DC | PRN
  Filled 2017-07-19: qty 2

## 2017-07-19 MED ORDER — LIDOCAINE 2% (20 MG/ML) 5 ML SYRINGE
INTRAMUSCULAR | Status: AC
  Filled 2017-07-19: qty 5

## 2017-07-19 MED ORDER — CEFAZOLIN SODIUM-DEXTROSE 2-4 GM/100ML-% IV SOLN
2.0000 g | INTRAVENOUS | Status: AC
Start: 2017-07-19 — End: 2017-07-19
  Administered 2017-07-19: 2 g via INTRAVENOUS
  Filled 2017-07-19: qty 100

## 2017-07-19 MED ORDER — HYDROCHLOROTHIAZIDE 12.5 MG PO CAPS
12.5000 mg | ORAL_CAPSULE | Freq: Two times a day (BID) | ORAL | Status: DC
  Administered 2017-07-19 – 2017-07-20 (×2): 12.5 mg via ORAL
  Filled 2017-07-19 (×2): qty 1

## 2017-07-19 MED ORDER — THROMBIN 5000 UNITS EX SOLR
CUTANEOUS | Status: AC
  Filled 2017-07-19: qty 10000

## 2017-07-19 SURGICAL SUPPLY — 49 items
BAG DECANTER FOR FLEXI CONT (MISCELLANEOUS) ×2 IMPLANT
BENZOIN TINCTURE PRP APPL 2/3 (GAUZE/BANDAGES/DRESSINGS) IMPLANT
BLADE CLIPPER SURG (BLADE) IMPLANT
BUR MATCHSTICK NEURO 3.0 LAGG (BURR) ×2 IMPLANT
BUR PRECISION FLUTE 5.0 (BURR) ×4 IMPLANT
CANISTER SUCT 3000ML PPV (MISCELLANEOUS) ×2 IMPLANT
CARTRIDGE OIL MAESTRO DRILL (MISCELLANEOUS) ×1 IMPLANT
DECANTER SPIKE VIAL GLASS SM (MISCELLANEOUS) ×2 IMPLANT
DERMABOND ADVANCED (GAUZE/BANDAGES/DRESSINGS) ×1
DERMABOND ADVANCED .7 DNX12 (GAUZE/BANDAGES/DRESSINGS) ×1 IMPLANT
DIFFUSER DRILL AIR PNEUMATIC (MISCELLANEOUS) ×2 IMPLANT
DRAPE LAPAROTOMY 100X72X124 (DRAPES) ×2 IMPLANT
DRAPE MICROSCOPE LEICA (MISCELLANEOUS) ×2 IMPLANT
DRAPE POUCH INSTRU U-SHP 10X18 (DRAPES) ×2 IMPLANT
DRAPE SURG 17X23 STRL (DRAPES) ×2 IMPLANT
DURAPREP 26ML APPLICATOR (WOUND CARE) ×2 IMPLANT
ELECT REM PT RETURN 9FT ADLT (ELECTROSURGICAL) ×2
ELECTRODE REM PT RTRN 9FT ADLT (ELECTROSURGICAL) ×1 IMPLANT
GAUZE SPONGE 4X4 12PLY STRL (GAUZE/BANDAGES/DRESSINGS) IMPLANT
GAUZE SPONGE 4X4 16PLY XRAY LF (GAUZE/BANDAGES/DRESSINGS) IMPLANT
GLOVE ECLIPSE 6.5 STRL STRAW (GLOVE) ×2 IMPLANT
GLOVE EXAM NITRILE LRG STRL (GLOVE) IMPLANT
GLOVE EXAM NITRILE XL STR (GLOVE) IMPLANT
GLOVE EXAM NITRILE XS STR PU (GLOVE) IMPLANT
GOWN STRL REUS W/ TWL LRG LVL3 (GOWN DISPOSABLE) ×1 IMPLANT
GOWN STRL REUS W/ TWL XL LVL3 (GOWN DISPOSABLE) ×1 IMPLANT
GOWN STRL REUS W/TWL 2XL LVL3 (GOWN DISPOSABLE) IMPLANT
GOWN STRL REUS W/TWL LRG LVL3 (GOWN DISPOSABLE) ×1
GOWN STRL REUS W/TWL XL LVL3 (GOWN DISPOSABLE) ×1
KIT BASIN OR (CUSTOM PROCEDURE TRAY) ×2 IMPLANT
KIT ROOM TURNOVER OR (KITS) ×2 IMPLANT
NEEDLE HYPO 25X1 1.5 SAFETY (NEEDLE) ×2 IMPLANT
NEEDLE SPNL 18GX3.5 QUINCKE PK (NEEDLE) ×2 IMPLANT
NS IRRIG 1000ML POUR BTL (IV SOLUTION) ×2 IMPLANT
OIL CARTRIDGE MAESTRO DRILL (MISCELLANEOUS) ×2
PACK LAMINECTOMY NEURO (CUSTOM PROCEDURE TRAY) ×2 IMPLANT
PAD ARMBOARD 7.5X6 YLW CONV (MISCELLANEOUS) ×10 IMPLANT
RUBBERBAND STERILE (MISCELLANEOUS) ×4 IMPLANT
SPONGE LAP 4X18 X RAY DECT (DISPOSABLE) IMPLANT
SPONGE SURGIFOAM ABS GEL 100 (HEMOSTASIS) ×2 IMPLANT
SPONGE SURGIFOAM ABS GEL SZ50 (HEMOSTASIS) IMPLANT
STRIP CLOSURE SKIN 1/2X4 (GAUZE/BANDAGES/DRESSINGS) IMPLANT
SUT VIC AB 0 CT1 18XCR BRD8 (SUTURE) ×1 IMPLANT
SUT VIC AB 0 CT1 8-18 (SUTURE) ×1
SUT VIC AB 2-0 CT1 18 (SUTURE) ×2 IMPLANT
SUT VIC AB 3-0 SH 8-18 (SUTURE) ×4 IMPLANT
TOWEL GREEN STERILE (TOWEL DISPOSABLE) ×2 IMPLANT
TOWEL GREEN STERILE FF (TOWEL DISPOSABLE) ×2 IMPLANT
WATER STERILE IRR 1000ML POUR (IV SOLUTION) ×2 IMPLANT

## 2017-07-19 NOTE — Transfer of Care (Signed)
Immediate Anesthesia Transfer of Care Note  Patient: Jose [name].  Procedure(s) Performed: Procedure(s): LAMINECTOMY AND FORAMINOTOMY  LUMBAR TWO- LUMBAR THREE, LUMBAR THREE- LUMBAR FOUR (N/A)  Patient Location: PACU  Anesthesia Type:General  Level of Consciousness: awake, alert  and oriented  Airway & Oxygen Therapy: Patient Spontanous Breathing and Patient connected to nasal cannula oxygen  Post-op Assessment: Report given to RN and Post -op Vital signs reviewed and stable  Post vital signs: Reviewed and stable  Last Vitals:  Vitals:   07/19/17 0818 07/19/17 1149  BP: 128/77 124/76  Pulse: (!) 57 66  Resp: 18   Temp: 36.7 C (!) 36.4 C  SpO2: 95% 98%    Last Pain:  Vitals:   07/19/17 0818  TempSrc: Oral      Patients Stated Pain Goal: 3 (45/62/56 3893)  Complications: No apparent anesthesia complications

## 2017-07-19 NOTE — Anesthesia Preprocedure Evaluation (Addendum)
Anesthesia Evaluation  Patient identified by MRN, date of birth, ID band Patient awake    Reviewed: Allergy & Precautions, NPO status , Patient's Chart, lab work & pertinent test results, reviewed documented beta blocker date and time   History of Anesthesia Complications (+) PONV and history of anesthetic complications  Airway Mallampati: IV  TM Distance: >3 FB Neck ROM: Full    Dental  (+) Teeth Intact, Dental Advisory Given   Pulmonary sleep apnea and Continuous Positive Airway Pressure Ventilation , former smoker,    breath sounds clear to auscultation       Cardiovascular hypertension, Pt. on medications and Pt. 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 c-spine surgery TIA Neuromuscular disease CVA    GI/Hepatic negative GI ROS, Neg liver ROS,   Endo/Other  negative endocrine ROS  Renal/GU negative Renal ROS     Musculoskeletal   Abdominal   Peds  Hematology  (+) anemia ,   Anesthesia Other Findings HLD  Reproductive/Obstetrics                            Anesthesia Physical  Anesthesia Plan  ASA: III  Anesthesia Plan: General   Post-op Pain Management:    Induction: Intravenous  PONV Risk Score and Plan: 3 and Ondansetron, Dexamethasone and Treatment may vary due to age or medical condition  Airway Management Planned: Oral ETT and Video Laryngoscope Planned  Additional Equipment:   Intra-op Plan:   Post-operative Plan: Extubation in OR  Informed Consent: I have reviewed the patients History and Physical, chart, labs and discussed the procedure including the risks, benefits and alternatives for the proposed  anesthesia with the patient or authorized representative who has indicated his/her understanding and acceptance.   Dental advisory given  Plan Discussed with: CRNA  Anesthesia Plan Comments:        Anesthesia Quick Evaluation

## 2017-07-19 NOTE — Anesthesia Postprocedure Evaluation (Signed)
Anesthesia Post Note  Patient: Jose [name].  Procedure(s) Performed: Procedure(s) (LRB): LAMINECTOMY AND FORAMINOTOMY  LUMBAR TWO- LUMBAR THREE, LUMBAR THREE- LUMBAR FOUR (N/A)     Patient location during evaluation: PACU Anesthesia Type: General Level of consciousness: awake and alert Pain management: pain level controlled Vital Signs Assessment: post-procedure vital signs reviewed and stable Respiratory status: spontaneous breathing, nonlabored ventilation, respiratory function stable and patient connected to nasal cannula oxygen Cardiovascular status: blood pressure returned to baseline and stable Postop Assessment: no apparent nausea or vomiting Anesthetic complications: no    Last Vitals:  Vitals:   07/19/17 1530 07/19/17 1545  BP:    Pulse: 77 79  Resp: 18 16  Temp:  36.5 C  SpO2: 97% 97%    Last Pain:  Vitals:   07/19/17 1515  TempSrc:   PainSc: 0-No pain                 Ryan P Ellender

## 2017-07-19 NOTE — Anesthesia Procedure Notes (Signed)
Procedure Name: Intubation Date/Time: 07/19/2017 9:36 AM Performed by: Eligha Bridegroom Pre-anesthesia Checklist: Patient identified, Emergency Drugs available, Suction available, Patient being monitored and Timeout performed Patient Re-evaluated:Patient Re-evaluated prior to induction Oxygen Delivery Method: Circle system utilized Preoxygenation: Pre-oxygenation with 100% oxygen Induction Type: IV induction Ventilation: Mask ventilation without difficulty and Oral airway inserted - appropriate to patient size Laryngoscope Size: Glidescope Grade View: Grade III Tube type: Oral Tube size: 7.5 mm Number of attempts: 1 Airway Equipment and Method: Stylet and Video-laryngoscopy Placement Confirmation: ETT inserted through vocal cords under direct vision,  positive ETCO2 and breath sounds checked- equal and bilateral Secured at: 22 cm Tube secured with: Tape Dental Injury: Teeth and Oropharynx as per pre-operative assessment

## 2017-07-19 NOTE — Op Note (Signed)
BP 128/77   Pulse (!) 57   Temp 98 F (36.7 C) (Oral)   Resp 18   Ht 6' (1.829 m)   Wt 102.1 kg (225 lb)   SpO2 95%   BMI 30.52 kg/m  07/19/2017  11:43 AM  PATIENT:  Jose [name].  81 y.o. male with severe lumbar stenosis, lower extremity pain, and back pain. He has agreed to undergo operative decompression.  PRE-OPERATIVE DIAGNOSIS:  SPINAL STENOSIS, LUMBAR REGION WITH NEUROGENIC CLAUDICATION L2/3,3/4 POST-OPERATIVE DIAGNOSIS:  SPINAL STENOSIS, LUMBAR REGION WITH NEUROGENIC CLAUDICATION L2/3.3/4 PROCEDURE:  Procedure(s): LAMINECTOMY AND FORAMINOTOMY  LUMBAR TWO- LUMBAR THREE, LUMBAR THREE- LUMBAR FOUR  SURGEON: Surgeon(s): Ashok Pall, MD Consuella Lose, MD  ASSISTANTS:Nundkumar, Neeles  ANESTHESIA:   general  EBL:  Total I/O In: 1650 [I.V.:1650] Out: 100 [Blood:100]  BLOOD ADMINISTERED:none  CELL SAVER GIVEN:none  COUNT:per nursing  DRAINS: none   SPECIMEN:  No Specimen  DICTATION: Jose [name]. was taken to the operating room, intubated, and placed under a general anesthetic  without difficulty. He was positioned prone on the Wilson frame with all pressure points padded. His lumbar region was prepped and draped in a sterile manner. I infiltrated the planned incision with lidocaine. I incised the skin with a 10 blade and dissected sharply to the subcutaneous tissue. I exposed the lamina bilaterally with monopolar cautery. I checked my level with an intraoperative film. I performed a complete laminectomy of L2, and L3, and a hemilaminectomy of L4. I used the Leksell rongeur, Kerrison punches, and the drill to remove the bone and ligament. I decompressed the spinal canal and the L2,3, and L4 nerve roots bilaterally.  Once the decompression was complete I achieved good hemostasis. I irrigated the wound. Dr. Kathyrn Sheriff and I closed the wound in layers approximating the thoracolumbar fascia, the subcutaneous, and subcuticular layers with vicryl sutures. I  used dermabond as a sterile dressing.   PLAN OF CARE: Admit to inpatient   PATIENT DISPOSITION:  PACU - hemodynamically stable.   Delay start of Pharmacological VTE agent (>24hrs) due to surgical blood loss or risk of bleeding:  yes

## 2017-07-19 NOTE — H&P (Signed)
BP 128/77   Pulse (!) 57   Temp 98 F (36.7 C) (Oral)   Resp 18   Ht 6' (1.829 m)   Wt 102.1 kg (225 lb)   SpO2 95%   BMI 30.52 kg/m  He presents today for evaluation of pain that he has in his back and both lower extremities. He says that the pain is worse on the left side and weakness is worse on his left. This is something that has been going on for well over a year-and-a-half. Dr. Jannifer [place] is his neurologist and had ordered an MRI and a myelogram, and post myelogram CT that was done about a year ago in April. As a result of that, I am not going to be able to give him an answer today about what is happening; but, certainly I think it has to do with the lumbar stenosis, which is present at L2-3.  Mr. Rowe Robert does not necessarily have pain when he is walking, but he does have some weakness. He certainly does have pain, but he says sometimes it can be made better by walking more. He does walk in stooped fashion.  On his exam, he is notably weak in the hip flexors in his left lower extremity at 4/5. He does have some weakness in his plantar flexors at 4/5 also. He noted that he was unable to walk on his toes when he was examined by Dr. Jannifer [place] and that coincided with the start of the very severe pain. MEDICATIONS: He is currently taking the following: Amlodipine, Eliquis, Gabapentin, Labetalol, Losartan, Prednisone, and Tramadol. ALLERGIES: Tetracycline. OBJECTIVE: Neurologic: He is alert, oriented by 4, answering all questions appropriately. Memory, language, attention span, and fund of knowledge are normal. He walks in a stooped fashion, bent at the waist. Romberg is negative. He can heel walk. Reflexes are not elicited at the right knee, trace at the right ankle, 1+ at the left knee, 1+ at the left ankle. Vital Signs: Height 72 inches, weight 225 pounds. Blood pressure is 127/76, pulse is 47, temperature is 97.3. Pain is 7/10.  Mr. Rowe Robert comes in today to go over his MRI. He has fairly bad stenosis  at L2-3, and has what appears to be a disc at L3-4 eccentric to the left. The stenosis is such that I think he can get a simple decompression. I don't think he needs hardware there. We spoke 17 minutes in the office. His belief is that he would simply like this fixed. He does not think conservative measures would provide enough relief. He would like to go forward sometime in mid September the 8th.

## 2017-07-20 ENCOUNTER — Encounter (HOSPITAL_COMMUNITY): Payer: Self-pay | Admitting: Neurosurgery

## 2017-07-20 DIAGNOSIS — M48062 Spinal stenosis, lumbar region with neurogenic claudication: Secondary | ICD-10-CM | POA: Diagnosis not present

## 2017-07-20 NOTE — Care Management CC44 (Signed)
Condition Code 44 Documentation Completed  Patient Details  Name: Jose [name]. MRN: [id number] Date of Birth: Nov 27, 1935   Condition Code 44 given:  Yes Patient signature on Condition Code 44 notice:  Yes Documentation of 2 MD's agreement:  Yes Code 44 added to claim:  Yes    Zenon Mayo, RN 07/20/2017, 9:39 AM

## 2017-07-20 NOTE — Care Management Obs Status (Signed)
[facility] NOTIFICATION   Patient Details  Name: Jose [name]. MRN: [id number] Date of Birth: May 26, 1936   Medicare Observation Status Notification Given:  Yes    Zenon Mayo, RN 07/20/2017, 9:39 AM

## 2017-07-20 NOTE — Progress Notes (Signed)
Patient alert and oriented, mae's well, voiding adequate amount of urine, swallowing without difficulty, no c/o pain at time of discharge. Patient discharged home with family. Script and discharged instructions given to patient. Patient and family stated understanding of instructions given. Patient has an appointment with Dr. Cabbell   

## 2017-07-20 NOTE — Discharge Summary (Addendum)
Physician Discharge Summary  Patient ID: Jose [name]. MRN: [id number] DOB/AGE: 01/13/1936 81 y.o.  Admit date: 07/19/2017 Discharge date: 07/20/2017  Admission Diagnoses:lumbar stenosis with neurogenic claudication  Discharge Diagnoses:  Active Problems:   Lumbar stenosis with neurogenic claudication   Discharged Condition: good  Hospital Course: Jose [name] was admitted and taken to the operating room for an uncomplicated lumbar laminectomy and decompression. At discharge he is voiding, ambulating, and tolerating a regular diet. The wound is clean, dry, and without signs of infection  Treatments: surgery: LAMINECTOMY AND FORAMINOTOMY  LUMBAR TWO- LUMBAR THREE, LUMBAR THREE- LUMBAR FOUR   Discharge Exam: Blood pressure 139/82, pulse 68, temperature 98.1 F (36.7 C), temperature source Oral, resp. rate 20, height 6' (1.829 m), weight 102.1 kg (225 lb), SpO2 95 %. General appearance: alert, cooperative, appears stated age and no distress Neurologic: Alert and oriented X 3, normal strength and tone. Normal symmetric reflexes. Normal coordination and gait  Disposition: 01-Home or Self Care SPINAL STENOSIS, LUMBAR REGION WITH NEUROGENIC CLAUDICATION  Allergies as of 07/20/2017      Reactions   Maprotiline    TETRACYCLIC ANTIDEPRESSANTS UNSPECIFIED REACTION (patient is unaware of allergy)   Tetracycline Hcl Itching, Rash   Rash on hands      Medication List    TAKE these medications   amLODipine 5 MG tablet Commonly known as:  NORVASC Take 5 mg by mouth daily.   atorvastatin 10 MG tablet Commonly known as:  LIPITOR Take 10 mg by mouth daily.   baclofen 10 MG tablet Commonly known as:  LIORESAL Take 1 tablet (10 mg total) by mouth 3 (three) times daily. As needed for muscle spasm   ELIQUIS 5 MG Tabs tablet Generic drug:  apixaban TAKE 1 TABLET TWICE A DAY   Fish Oil 1000 MG Caps Take 2,000 mg by mouth daily.   gabapentin 300 MG capsule Commonly known as:   NEURONTIN One capsule twice during the day and 2 at night What changed:  how much to take  how to take this  additional instructions   labetalol 200 MG tablet Commonly known as:  NORMODYNE Take 100 mg by mouth 2 (two) times daily.   losartan-hydrochlorothiazide 50-12.5 MG tablet Commonly known as:  HYZAAR Take 1 tablet by mouth 2 (two) times daily.   meclizine 25 MG tablet Commonly known as:  ANTIVERT Take 1 tablet (25 mg total) by mouth 3 (three) times daily as needed.   meloxicam 15 MG tablet Commonly known as:  MOBIC Take 15 mg by mouth daily as needed for pain.   multivitamin with minerals Tabs tablet Take 1 tablet by mouth daily. Centrum Silver   ondansetron 4 MG tablet Commonly known as:  ZOFRAN Take 1 tablet (4 mg total) by mouth every 8 (eight) hours as needed for nausea or vomiting.   oxyCODONE-acetaminophen 10-325 MG tablet Commonly known as:  PERCOCET Take 1-2 tablets by mouth every 6 (six) hours as needed for pain. MAXIMUM TOTAL ACETAMINOPHEN DOSE IS 4000 MG PER DAY   PRESERVISION AREDS 2 Caps Take 1 capsule by mouth 2 (two) times daily.   sennosides-docusate sodium 8.6-50 MG tablet Commonly known as:  SENOKOT-S Take 2 tablets by mouth daily.   SYSTANE BALANCE 0.6 % Soln Generic drug:  Propylene Glycol Place 1-2 drops into both eyes at bedtime.   traMADol 50 MG tablet Commonly known as:  ULTRAM Take 1 tablet (50 mg total) by mouth every 6 (six) hours as needed.  Follow-up Information    Ashok Pall, MD Follow up in 3 week(s).   Specialty:  Neurosurgery Why:  please call the office to make an appointment Contact information: [address] [place] [address] [phone]           Signed: Winfield Cunas 07/20/2017, 8:35 AM

## 2017-07-20 NOTE — Discharge Instructions (Signed)
Wound Care Leave incision open to air. You may shower. Do not scrub directly on incision.  Do not put any creams, lotions, or ointments on incision. Activity Walk each and every day, increasing distance each day. No lifting greater than 5 lbs.  Avoid bending, arching, and twisting. No driving for 10 days,  may ride as a passenger locally.  Diet Resume your normal diet.   Call Your Doctor If Any of These Occur Redness, drainage, or swelling at the wound.  Temperature greater than 101 degrees. Severe pain not relieved by pain medication. Incision starts to come apart. Follow Up Appt Call today for appointment in 3 weeks ([phone]) or for problems.  If you have any hardware placed in your spine, you will need an x-ray before your appointment.

## 2017-07-20 NOTE — Progress Notes (Signed)
Physical Therapy Evaluation Patient Details Name: Jose [name]. MRN: [id number] DOB: 1935-11-12 Today's Date: 07/20/2017   History of Present Illness  81 y.o. male with h/o severe lumbar stenosis presents s/p laminectomy and foraminotomy of L2-L4  Clinical Impression  Evaluated patient and educated him on surgical precautions, bed mobility, transfers, and car transfers. Demonstrates good prognosis and capacity for return to prior level of function. Further PT services are not recommended at this time. PT will sign off.     Follow Up Recommendations No PT follow up    Equipment Recommendations  None recommended by PT    Recommendations for Other Services       Precautions / Restrictions Precautions Precautions: Back Precaution Booklet Issued: Yes (comment) Precaution Comments: surgical back handout reviewed and left with patient Restrictions Weight Bearing Restrictions: No      Mobility  Bed Mobility Overal bed mobility: Modified Independent Bed Mobility: Rolling;Sidelying to Sit Rolling: Modified independent (Device/Increase time) Sidelying to sit: Modified independent (Device/Increase time)       General bed mobility comments: use of bed rail for power up from sidelying to sit; good recall of log roll technique  Transfers Overall transfer level: Independent Equipment used: None             General transfer comment: sit to stand independent  Ambulation/Gait Ambulation/Gait assistance: Supervision Ambulation Distance (Feet): 400 Feet Assistive device: None Gait Pattern/deviations: Antalgic;Step-through pattern;Decreased stance time - left;Decreased stride length Gait velocity: decreased Gait velocity interpretation: Below normal speed for age/gender General Gait Details: cautious gait, antalgic gait noted with decreased stance time on L. No significant LOB noted  Stairs Stairs: Yes Stairs assistance: Supervision Stair Management: One rail  Right;Alternating pattern;Step to pattern Number of Stairs: 4 General stair comments: alternating step ascending; step to pattern descending. supervision for safety  Wheelchair Mobility    Modified Rankin (Stroke Patients Only)       Balance Overall balance assessment: No apparent balance deficits (not formally assessed)                                           Pertinent Vitals/Pain Pain Assessment: Faces Pain Score: 1  Faces Pain Scale: No hurt Pain Location: incision Pain Descriptors / Indicators: Discomfort    Home Living Family/patient expects to be discharged to:: Private residence Living Arrangements: Spouse/significant other Available Help at Discharge: Family;Available PRN/intermittently Type of Home: House Home Access: Stairs to enter Entrance Stairs-Rails: None Entrance Stairs-Number of Steps: 1 Home Layout: One level Home Equipment: Walker - 2 wheels;Cane - single point      Prior Function Level of Independence: Independent         Comments: independent; PRN use of SPC and RW secondary to pain      Hand Dominance   Dominant Hand: Right    Extremity/Trunk Assessment   Upper Extremity Assessment Upper Extremity Assessment: Overall WFL for tasks assessed    Lower Extremity Assessment Lower Extremity Assessment: RLE deficits/detail;LLE deficits/detail RLE Sensation: history of peripheral neuropathy LLE Deficits / Details: 4+/5 DF  LLE Sensation: history of peripheral neuropathy       Communication   Communication: No difficulties  Cognition Arousal/Alertness: Awake/alert Behavior During Therapy: WFL for tasks assessed/performed Overall Cognitive Status: Within Functional Limits for tasks assessed  General Comments: cognition not formally assessed      General Comments General comments (skin integrity, edema, etc.): pt educated on bed mobility, car transfers and reviewed back  precautions handout    Exercises     Assessment/Plan    PT Assessment Patent does not need any further PT services  PT Problem List         PT Treatment Interventions      PT Goals (Current goals can be found in the Care Plan section)  Acute Rehab PT Goals Patient Stated Goal: return home PT Goal Formulation: With patient Time For Goal Achievement: 08/03/17 Potential to Achieve Goals: Good    Frequency     Barriers to discharge        Co-evaluation               AM-PAC PT "6 Clicks" Daily Activity  Outcome Measure Difficulty turning over in bed (including adjusting bedclothes, sheets and blankets)?: A Little Difficulty moving from lying on back to sitting on the side of the bed? : A Little Difficulty sitting down on and standing up from a chair with arms (e.g., wheelchair, bedside commode, etc,.)?: None Help needed moving to and from a bed to chair (including a wheelchair)?: None Help needed walking in hospital room?: None Help needed climbing 3-5 steps with a railing? : A Little 6 Click Score: 21    End of Session Equipment Utilized During Treatment: Gait belt Activity Tolerance: Patient tolerated treatment well Patient left: in chair;with call bell/phone within reach Nurse Communication: Mobility status PT Visit Diagnosis: Difficulty in walking, not elsewhere classified (R26.2)    Time: 0177-9390 PT Time Calculation (min) (ACUTE ONLY): 19 min   Charges:   PT Evaluation $PT Eval Low Complexity: 1 Low     PT G Codes:   PT G-Codes **NOT FOR INPATIENT CLASS** Functional Assessment Tool Used: Clinical judgement Functional Limitation: Mobility: Walking and moving around Mobility: Walking and Moving Around Current Status (Z0092): At least 1 percent but less than 20 percent impaired, limited or restricted Mobility: Walking and Moving Around Goal Status [phone]): At least 1 percent but less than 20 percent impaired, limited or restricted Mobility: Walking and  Moving Around Discharge Status [phone]): At least 1 percent but less than 20 percent impaired, limited or restricted    A. Quintella Reichert, SPT [phone] office   Margarita Grizzle 07/20/2017, 9:21 AM

## 2017-08-01 MED FILL — LATANOPROST 0.005% EYE DRP: 0.005 | 50 days supply | Qty: 3 | Fill #0

## 2017-08-08 MED FILL — AMOXICILLIN 500 MG CAPSULE: 500 | 1 days supply | Qty: 4 | Fill #0

## 2017-08-21 MED FILL — PREDNISOLONE AC 1% EYE DROP: 1 | 50 days supply | Qty: 5 | Fill #0

## 2017-10-03 ENCOUNTER — Other Ambulatory Visit: Payer: Self-pay | Admitting: Neurology

## 2017-10-23 MED FILL — LATANOPROST 0.005% EYE DRP: 0.005 | 30 days supply | Qty: 3 | Fill #0

## 2017-10-25 ENCOUNTER — Encounter (HOSPITAL_COMMUNITY)
Admission: RE | Admit: 2017-10-25 | Discharge: 2017-10-25 | Disposition: A | Discharge status: Home / Self Care | Payer: Medicare Other | Source: Ambulatory Visit | Attending: Orthopedic Surgery | Admitting: Orthopedic Surgery

## 2017-10-25 ENCOUNTER — Encounter (HOSPITAL_COMMUNITY): Payer: Self-pay

## 2017-10-25 ENCOUNTER — Other Ambulatory Visit: Payer: Self-pay | Admitting: Orthopedic Surgery

## 2017-10-25 ENCOUNTER — Other Ambulatory Visit: Payer: Self-pay

## 2017-10-25 DIAGNOSIS — Z01818 Encounter for other preprocedural examination: Secondary | ICD-10-CM | POA: Diagnosis not present

## 2017-10-25 DIAGNOSIS — M25512 Pain in left shoulder: Secondary | ICD-10-CM | POA: Diagnosis not present

## 2017-10-25 LAB — CBC
HCT: 40.1 % (ref 39.0–52.0)
Hemoglobin: 12.9 g/dL — ABNORMAL LOW (ref 13.0–17.0)
MCH: 30.6 pg (ref 26.0–34.0)
MCHC: 32.2 g/dL (ref 30.0–36.0)
MCV: 95 fL (ref 78.0–100.0)
PLATELETS: 171 10*3/uL (ref 150–400)
RBC: 4.22 MIL/uL (ref 4.22–5.81)
RDW: 13.9 % (ref 11.5–15.5)
WBC: 6.2 10*3/uL (ref 4.0–10.5)

## 2017-10-25 LAB — BASIC METABOLIC PANEL
Anion gap: 9 (ref 5–15)
BUN: 15 mg/dL (ref 6–20)
CHLORIDE: 106 mmol/L (ref 101–111)
CO2: 23 mmol/L (ref 22–32)
CREATININE: 0.72 mg/dL (ref 0.61–1.24)
Calcium: 9.1 mg/dL (ref 8.9–10.3)
Glucose, Bld: 91 mg/dL (ref 65–99)
Potassium: 3.9 mmol/L (ref 3.5–5.1)
SODIUM: 138 mmol/L (ref 135–145)

## 2017-10-25 LAB — SURGICAL PCR SCREEN
MRSA, PCR: NEGATIVE
STAPHYLOCOCCUS AUREUS: NEGATIVE

## 2017-10-25 NOTE — Pre-Procedure Instructions (Signed)
Para Skeans Jr.  10/25/2017      [facility] - [place], [place] - [address] [place] [address] Phone: [phone] Fax: [phone]  [place] [address] [place] [address] Phone: [phone] Fax: [phone]    Your procedure is scheduled on Tuesday, January 8.  Report to [facility] Admitting at 8:00 AM                   Your surgery or procedure is scheduled for 10:00 A.M.   Call this number if you have problems the morning of surgery:  [phone]- pre- op desk                  For any other questions, please call [phone], Monday - Friday 8 AM - 4 PM.     Remember:  Do not eat food or drink liquids after midnight Monday, January 7.  Take these medicines the morning of surgery with A SIP OF WATER:  amLODipine (NORVASC) gabapentin (NEURONTIN)  labetalol (NORMODYNE)  ELIQUIS- your surgeon wil give you instructions on stopping or continuing.  1 Week prior to surgery STOP taking Aspirin, Aspirin Products (Goody Powder, Excedrin Migraine), Ibuprofen (Advil), Naproxen (Aleve), Vitamins and Herbal Products (ie Fish Oil).  Special instructions:  [facility]- Preparing For Surgery  Before surgery, you can play an important role. Because skin is not sterile, your skin needs to be as free of germs as possible. You can reduce the number of germs on your skin by washing with CHG (chlorahexidine gluconate) Soap before surgery.  CHG is an antiseptic cleaner which kills germs and bonds with the skin to continue killing germs even after washing.  Please do not use if you have an allergy to CHG or antibacterial soaps. If your skin becomes reddened/irritated stop using the CHG.  Do not shave (including legs and underarms) for at least 48 hours prior to first CHG shower. It is OK to shave your face.  Please follow these instructions  carefully.   1. Shower the NIGHT BEFORE SURGERY and the MORNING OF SURGERY with CHG.   2. If you chose to wash your hair, wash your hair first as usual with your normal shampoo.  3. After you shampoo, rinse your hair and body thoroughly to remove the shampoo.    Wash your face and private area with the soap you use at home, then rinse.  4. Use CHG as you would any other liquid soap. You can apply CHG directly to the skin and wash gently with a scrungie or a clean washcloth.   5. Apply the CHG Soap to your body ONLY FROM THE NECK DOWN.  Do not use on open wounds or open sores. Avoid contact with your eyes, ears, mouth and genitals (private parts). Wash Face and genitals (private parts)  with your normal soap.  6. Wash thoroughly, paying special attention to the area where your surgery will be performed.  7. Thoroughly rinse your body with warm water from the neck down.  8. DO NOT shower/wash with your normal soap after using and rinsing off the CHG Soap.  9. Pat yourself dry with a CLEAN TOWEL.  10. Wear CLEAN PAJAMAS to bed the night before surgery, wear comfortable clothes the morning of surgery  11. Place CLEAN SHEETS on your bed the night of  your first shower and DO NOT SLEEP WITH PETS.  Day of Surgery: Shower as Above Do not apply any deodorants/lotions. Please wear clean clothes to the hospital/surgery center.    Do not wear jewelry, make-up or nail polish.  Do not wear lotions, powders, or perfumes, or deodorant.  Do not shave 48 hours prior to surgery.  Men may shave face and neck.  Do not bring valuables to the hospital.  [facility] is not responsible for any belongings or valuables.  Contacts, dentures or bridgework may not be worn into surgery.  Leave your suitcase in the car.  After surgery it may be brought to your room.  For patients admitted to the hospital, discharge time will be determined by your treatment team.  Patients discharged the day of surgery will not  be allowed to drive home.   Please read over the following fact sheets that you were given.

## 2017-10-30 DIAGNOSIS — R42 Dizziness and giddiness: Secondary | ICD-10-CM | POA: Insufficient documentation

## 2017-11-05 ENCOUNTER — Encounter (HOSPITAL_COMMUNITY)
Admission: RE | Disposition: A | Discharge status: Home / Self Care | Payer: Self-pay | Source: Ambulatory Visit | Attending: Orthopedic Surgery

## 2017-11-05 ENCOUNTER — Inpatient Hospital Stay (HOSPITAL_COMMUNITY): Payer: Medicare Other

## 2017-11-05 ENCOUNTER — Inpatient Hospital Stay (HOSPITAL_COMMUNITY): Payer: Medicare Other | Admitting: Certified Registered"

## 2017-11-05 ENCOUNTER — Other Ambulatory Visit: Payer: Self-pay

## 2017-11-05 ENCOUNTER — Inpatient Hospital Stay (HOSPITAL_COMMUNITY)
Admission: RE | Admit: 2017-11-05 | Discharge: 2017-11-06 | DRG: 483 | Disposition: A | Discharge status: Home / Self Care | Payer: Medicare Other | Source: Ambulatory Visit | Attending: Orthopedic Surgery | Admitting: Orthopedic Surgery

## 2017-11-05 ENCOUNTER — Encounter (HOSPITAL_COMMUNITY): Payer: Self-pay | Admitting: *Deleted

## 2017-11-05 DIAGNOSIS — M19011 Primary osteoarthritis, right shoulder: Secondary | ICD-10-CM | POA: Diagnosis present

## 2017-11-05 DIAGNOSIS — Z7901 Long term (current) use of anticoagulants: Secondary | ICD-10-CM

## 2017-11-05 DIAGNOSIS — Z8673 Personal history of transient ischemic attack (TIA), and cerebral infarction without residual deficits: Secondary | ICD-10-CM

## 2017-11-05 DIAGNOSIS — Z87891 Personal history of nicotine dependence: Secondary | ICD-10-CM | POA: Diagnosis not present

## 2017-11-05 DIAGNOSIS — Z823 Family history of stroke: Secondary | ICD-10-CM

## 2017-11-05 DIAGNOSIS — G4730 Sleep apnea, unspecified: Secondary | ICD-10-CM | POA: Diagnosis present

## 2017-11-05 DIAGNOSIS — Z881 Allergy status to other antibiotic agents status: Secondary | ICD-10-CM | POA: Diagnosis not present

## 2017-11-05 DIAGNOSIS — G629 Polyneuropathy, unspecified: Secondary | ICD-10-CM | POA: Diagnosis present

## 2017-11-05 DIAGNOSIS — I10 Essential (primary) hypertension: Secondary | ICD-10-CM | POA: Diagnosis present

## 2017-11-05 DIAGNOSIS — Z96612 Presence of left artificial shoulder joint: Secondary | ICD-10-CM | POA: Diagnosis present

## 2017-11-05 DIAGNOSIS — Z888 Allergy status to other drugs, medicaments and biological substances status: Secondary | ICD-10-CM

## 2017-11-05 DIAGNOSIS — Z96619 Presence of unspecified artificial shoulder joint: Secondary | ICD-10-CM

## 2017-11-05 DIAGNOSIS — I4891 Unspecified atrial fibrillation: Secondary | ICD-10-CM | POA: Diagnosis present

## 2017-11-05 DIAGNOSIS — I739 Peripheral vascular disease, unspecified: Secondary | ICD-10-CM | POA: Diagnosis present

## 2017-11-05 DIAGNOSIS — M19019 Primary osteoarthritis, unspecified shoulder: Secondary | ICD-10-CM | POA: Diagnosis present

## 2017-11-05 DIAGNOSIS — M25511 Pain in right shoulder: Secondary | ICD-10-CM | POA: Diagnosis present

## 2017-11-05 HISTORY — PX: TOTAL SHOULDER ARTHROPLASTY: SHX126

## 2017-11-05 HISTORY — DX: Primary osteoarthritis, right shoulder: M19.011

## 2017-11-05 SURGERY — ARTHROPLASTY, SHOULDER, TOTAL
Anesthesia: General | Laterality: Right

## 2017-11-05 MED ORDER — HYDROCHLOROTHIAZIDE 12.5 MG PO CAPS
12.5000 mg | ORAL_CAPSULE | Freq: Two times a day (BID) | ORAL | Status: DC
  Administered 2017-11-05 – 2017-11-06 (×2): 12.5 mg via ORAL
  Filled 2017-11-05 (×2): qty 1

## 2017-11-05 MED ORDER — ATORVASTATIN CALCIUM 10 MG PO TABS
10.0000 mg | ORAL_TABLET | Freq: Every day | ORAL | Status: DC
  Administered 2017-11-05 – 2017-11-06 (×2): 10 mg via ORAL
  Filled 2017-11-05 (×2): qty 1

## 2017-11-05 MED ORDER — GABAPENTIN 300 MG PO CAPS
600.0000 mg | ORAL_CAPSULE | Freq: Every day | ORAL | Status: DC
  Administered 2017-11-05: 600 mg via ORAL
  Filled 2017-11-05: qty 2

## 2017-11-05 MED ORDER — POLYETHYLENE GLYCOL 3350 17 G PO PACK: 17.0000 g | PACK | Freq: Every day | ORAL | Status: DC | PRN

## 2017-11-05 MED ORDER — ONDANSETRON HCL 4 MG PO TABS: 4.0000 mg | ORAL_TABLET | Freq: Four times a day (QID) | ORAL | Status: DC | PRN

## 2017-11-05 MED ORDER — METOCLOPRAMIDE HCL 5 MG/ML IJ SOLN: 5.0000 mg | Freq: Three times a day (TID) | INTRAMUSCULAR | Status: DC | PRN

## 2017-11-05 MED ORDER — MAGNESIUM CITRATE PO SOLN: 1.0000 | Freq: Once | ORAL | Status: DC | PRN

## 2017-11-05 MED ORDER — LIDOCAINE 2% (20 MG/ML) 5 ML SYRINGE
INTRAMUSCULAR | Status: DC | PRN
  Administered 2017-11-05: 40 mg via INTRAVENOUS

## 2017-11-05 MED ORDER — PRESERVISION AREDS 2 PO CAPS: 1.0000 | ORAL_CAPSULE | Freq: Two times a day (BID) | ORAL | Status: DC

## 2017-11-05 MED ORDER — SODIUM CHLORIDE 0.9 % IR SOLN
Status: DC | PRN
Start: 2017-11-05 — End: 2017-11-05
  Administered 2017-11-05: 1000 mL

## 2017-11-05 MED ORDER — GABAPENTIN 300 MG PO CAPS
300.0000 mg | ORAL_CAPSULE | Freq: Two times a day (BID) | ORAL | Status: DC
Start: 2017-11-05 — End: 2017-11-06
  Administered 2017-11-05 – 2017-11-06 (×2): 300 mg via ORAL
  Filled 2017-11-05 (×2): qty 1

## 2017-11-05 MED ORDER — FENTANYL CITRATE (PF) 100 MCG/2ML IJ SOLN
INTRAMUSCULAR | Status: AC
  Filled 2017-11-05: qty 2

## 2017-11-05 MED ORDER — OXYCODONE-ACETAMINOPHEN 10-325 MG PO TABS: 1.0000 | ORAL_TABLET | Freq: Four times a day (QID) | ORAL | 0 refills | Status: DC | PRN

## 2017-11-05 MED ORDER — DEXAMETHASONE SODIUM PHOSPHATE 10 MG/ML IJ SOLN
INTRAMUSCULAR | Status: DC | PRN
  Administered 2017-11-05: 10 mg via INTRAVENOUS

## 2017-11-05 MED ORDER — CEFAZOLIN SODIUM-DEXTROSE 1-4 GM/50ML-% IV SOLN
1.0000 g | Freq: Four times a day (QID) | INTRAVENOUS | Status: AC
  Administered 2017-11-05 – 2017-11-06 (×3): 1 g via INTRAVENOUS
  Filled 2017-11-05 (×3): qty 50

## 2017-11-05 MED ORDER — ALUM & MAG HYDROXIDE-SIMETH 200-200-20 MG/5ML PO SUSP: 30.0000 mL | ORAL | Status: DC | PRN

## 2017-11-05 MED ORDER — ACETAMINOPHEN 500 MG PO TABS
1000.0000 mg | ORAL_TABLET | Freq: Four times a day (QID) | ORAL | Status: DC
  Administered 2017-11-05 – 2017-11-06 (×3): 1000 mg via ORAL
  Filled 2017-11-05 (×2): qty 2

## 2017-11-05 MED ORDER — GABAPENTIN 300 MG PO CAPS: 300.0000 mg | ORAL_CAPSULE | Freq: Four times a day (QID) | ORAL | Status: DC

## 2017-11-05 MED ORDER — ZOLPIDEM TARTRATE 5 MG PO TABS: 5.0000 mg | ORAL_TABLET | Freq: Every evening | ORAL | Status: DC | PRN

## 2017-11-05 MED ORDER — GLYCOPYRROLATE 0.2 MG/ML IV SOSY
PREFILLED_SYRINGE | INTRAVENOUS | Status: DC | PRN
  Administered 2017-11-05: .2 mg via INTRAVENOUS

## 2017-11-05 MED ORDER — BUPIVACAINE HCL (PF) 0.25 % IJ SOLN
INTRAMUSCULAR | Status: AC
  Filled 2017-11-05: qty 30

## 2017-11-05 MED ORDER — SUGAMMADEX SODIUM 200 MG/2ML IV SOLN
INTRAVENOUS | Status: DC | PRN
  Administered 2017-11-05: 200 mg via INTRAVENOUS

## 2017-11-05 MED ORDER — BUPIVACAINE-EPINEPHRINE (PF) 0.5% -1:200000 IJ SOLN
INTRAMUSCULAR | Status: DC | PRN
  Administered 2017-11-05: 30 mL via PERINEURAL

## 2017-11-05 MED ORDER — AMLODIPINE BESYLATE 5 MG PO TABS
5.0000 mg | ORAL_TABLET | Freq: Every day | ORAL | Status: DC
  Filled 2017-11-05: qty 1

## 2017-11-05 MED ORDER — APIXABAN 5 MG PO TABS
5.0000 mg | ORAL_TABLET | Freq: Two times a day (BID) | ORAL | Status: DC
  Administered 2017-11-06: 5 mg via ORAL
  Filled 2017-11-05: qty 1

## 2017-11-05 MED ORDER — FENTANYL CITRATE (PF) 100 MCG/2ML IJ SOLN
50.0000 ug | Freq: Once | INTRAMUSCULAR | Status: AC
  Administered 2017-11-05: 50 ug via INTRAVENOUS
  Filled 2017-11-05: qty 1

## 2017-11-05 MED ORDER — PROPOFOL 10 MG/ML IV BOLUS
INTRAVENOUS | Status: AC
  Filled 2017-11-05: qty 20

## 2017-11-05 MED ORDER — METHOCARBAMOL 1000 MG/10ML IJ SOLN: 500.0000 mg | Freq: Four times a day (QID) | INTRAVENOUS | Status: DC | PRN

## 2017-11-05 MED ORDER — LOSARTAN POTASSIUM-HCTZ 50-12.5 MG PO TABS: 1.0000 | ORAL_TABLET | Freq: Two times a day (BID) | ORAL | Status: DC

## 2017-11-05 MED ORDER — DEXAMETHASONE SODIUM PHOSPHATE 10 MG/ML IJ SOLN
INTRAMUSCULAR | Status: AC
Start: 2017-11-05 — End: 2017-11-05
  Filled 2017-11-05: qty 1

## 2017-11-05 MED ORDER — DOCUSATE SODIUM 100 MG PO CAPS
100.0000 mg | ORAL_CAPSULE | Freq: Two times a day (BID) | ORAL | Status: DC
  Administered 2017-11-05 – 2017-11-06 (×2): 100 mg via ORAL
  Filled 2017-11-05 (×2): qty 1

## 2017-11-05 MED ORDER — EPHEDRINE SULFATE-NACL 50-0.9 MG/10ML-% IV SOSY
PREFILLED_SYRINGE | INTRAVENOUS | Status: DC | PRN
  Administered 2017-11-05: 15 mg via INTRAVENOUS
  Administered 2017-11-05 (×2): 5 mg via INTRAVENOUS
  Administered 2017-11-05: 15 mg via INTRAVENOUS
  Administered 2017-11-05: 10 mg via INTRAVENOUS
  Administered 2017-11-05: 5 mg via INTRAVENOUS
  Administered 2017-11-05: 15 mg via INTRAVENOUS

## 2017-11-05 MED ORDER — HYDROMORPHONE HCL 1 MG/ML IJ SOLN: 0.5000 mg | INTRAMUSCULAR | Status: DC | PRN

## 2017-11-05 MED ORDER — OXYCODONE HCL 5 MG PO TABS
10.0000 mg | ORAL_TABLET | ORAL | Status: DC | PRN
  Administered 2017-11-05: 10 mg via ORAL
  Filled 2017-11-05: qty 2

## 2017-11-05 MED ORDER — PROPOFOL 10 MG/ML IV BOLUS
INTRAVENOUS | Status: DC | PRN
  Administered 2017-11-05: 130 mg via INTRAVENOUS

## 2017-11-05 MED ORDER — 0.9 % SODIUM CHLORIDE (POUR BTL) OPTIME
TOPICAL | Status: DC | PRN
  Administered 2017-11-05: 1000 mL

## 2017-11-05 MED ORDER — DEXTROSE 5 % IV SOLN
INTRAVENOUS | Status: DC | PRN
  Administered 2017-11-05: 25 ug/min via INTRAVENOUS

## 2017-11-05 MED ORDER — DIPHENHYDRAMINE HCL 12.5 MG/5ML PO ELIX: 12.5000 mg | ORAL_SOLUTION | ORAL | Status: DC | PRN

## 2017-11-05 MED ORDER — LATANOPROST 0.005 % OP SOLN
1.0000 [drp] | Freq: Every day | OPHTHALMIC | Status: DC
  Administered 2017-11-05: 1 [drp] via OPHTHALMIC
  Filled 2017-11-05: qty 2.5

## 2017-11-05 MED ORDER — MEPERIDINE HCL 25 MG/ML IJ SOLN: 6.2500 mg | INTRAMUSCULAR | Status: DC | PRN

## 2017-11-05 MED ORDER — PHENYLEPHRINE 40 MCG/ML (10ML) SYRINGE FOR IV PUSH (FOR BLOOD PRESSURE SUPPORT)
PREFILLED_SYRINGE | INTRAVENOUS | Status: DC | PRN
  Administered 2017-11-05: 80 ug via INTRAVENOUS
  Administered 2017-11-05: 40 ug via INTRAVENOUS

## 2017-11-05 MED ORDER — KETOROLAC TROMETHAMINE 15 MG/ML IJ SOLN
7.5000 mg | Freq: Four times a day (QID) | INTRAMUSCULAR | Status: DC
  Administered 2017-11-05 – 2017-11-06 (×3): 7.5 mg via INTRAVENOUS
  Filled 2017-11-05 (×3): qty 1

## 2017-11-05 MED ORDER — LABETALOL HCL 100 MG PO TABS
100.0000 mg | ORAL_TABLET | Freq: Two times a day (BID) | ORAL | Status: DC
  Administered 2017-11-05 – 2017-11-06 (×2): 100 mg via ORAL
  Filled 2017-11-05 (×2): qty 1

## 2017-11-05 MED ORDER — OXYCODONE HCL 5 MG PO TABS: 5.0000 mg | ORAL_TABLET | ORAL | Status: DC | PRN

## 2017-11-05 MED ORDER — ACETAMINOPHEN 325 MG PO TABS
650.0000 mg | ORAL_TABLET | ORAL | Status: DC | PRN
Start: 2017-11-06 — End: 2017-11-06

## 2017-11-05 MED ORDER — MENTHOL 3 MG MT LOZG: 1.0000 | LOZENGE | OROMUCOSAL | Status: DC | PRN

## 2017-11-05 MED ORDER — BISACODYL 10 MG RE SUPP: 10.0000 mg | Freq: Every day | RECTAL | Status: DC | PRN

## 2017-11-05 MED ORDER — CEFAZOLIN SODIUM-DEXTROSE 2-4 GM/100ML-% IV SOLN
2.0000 g | INTRAVENOUS | Status: AC
  Administered 2017-11-05: 2 g via INTRAVENOUS

## 2017-11-05 MED ORDER — LACTATED RINGERS IV SOLN: INTRAVENOUS | Status: DC

## 2017-11-05 MED ORDER — PHENOL 1.4 % MT LIQD: 1.0000 | OROMUCOSAL | Status: DC | PRN

## 2017-11-05 MED ORDER — ACETAMINOPHEN 650 MG RE SUPP
650.0000 mg | RECTAL | Status: DC | PRN
Start: 2017-11-06 — End: 2017-11-06

## 2017-11-05 MED ORDER — ONDANSETRON HCL 4 MG/2ML IJ SOLN
INTRAMUSCULAR | Status: AC
  Filled 2017-11-05: qty 4

## 2017-11-05 MED ORDER — FENTANYL CITRATE (PF) 100 MCG/2ML IJ SOLN
INTRAMUSCULAR | Status: DC | PRN
  Administered 2017-11-05: 75 ug via INTRAVENOUS

## 2017-11-05 MED ORDER — FENTANYL CITRATE (PF) 100 MCG/2ML IJ SOLN: 25.0000 ug | INTRAMUSCULAR | Status: DC | PRN

## 2017-11-05 MED ORDER — MIDAZOLAM HCL 2 MG/2ML IJ SOLN
INTRAMUSCULAR | Status: AC
  Filled 2017-11-05: qty 2

## 2017-11-05 MED ORDER — CEFAZOLIN SODIUM-DEXTROSE 2-4 GM/100ML-% IV SOLN
INTRAVENOUS | Status: AC
  Filled 2017-11-05: qty 100

## 2017-11-05 MED ORDER — LACTATED RINGERS IV SOLN
INTRAVENOUS | Status: DC
  Administered 2017-11-05 (×2): via INTRAVENOUS

## 2017-11-05 MED ORDER — SENNA 8.6 MG PO TABS
1.0000 | ORAL_TABLET | Freq: Two times a day (BID) | ORAL | Status: DC
  Administered 2017-11-05 – 2017-11-06 (×2): 8.6 mg via ORAL
  Filled 2017-11-05 (×2): qty 1

## 2017-11-05 MED ORDER — CHLORHEXIDINE GLUCONATE 4 % EX LIQD: 60.0000 mL | Freq: Once | CUTANEOUS | Status: DC

## 2017-11-05 MED ORDER — ONDANSETRON HCL 4 MG/2ML IJ SOLN
INTRAMUSCULAR | Status: DC | PRN
  Administered 2017-11-05: 4 mg via INTRAVENOUS

## 2017-11-05 MED ORDER — PROMETHAZINE HCL 25 MG/ML IJ SOLN: 6.2500 mg | INTRAMUSCULAR | Status: DC | PRN

## 2017-11-05 MED ORDER — POTASSIUM CHLORIDE IN NACL 20-0.45 MEQ/L-% IV SOLN
INTRAVENOUS | Status: DC
  Administered 2017-11-05: 16:00:00 via INTRAVENOUS
  Filled 2017-11-05 (×2): qty 1000

## 2017-11-05 MED ORDER — ADULT MULTIVITAMIN W/MINERALS CH
1.0000 | ORAL_TABLET | Freq: Every day | ORAL | Status: DC
  Administered 2017-11-05 – 2017-11-06 (×2): 1 via ORAL
  Filled 2017-11-05 (×2): qty 1

## 2017-11-05 MED ORDER — FENTANYL CITRATE (PF) 250 MCG/5ML IJ SOLN
INTRAMUSCULAR | Status: AC
  Filled 2017-11-05: qty 5

## 2017-11-05 MED ORDER — PROSIGHT PO TABS
1.0000 | ORAL_TABLET | Freq: Two times a day (BID) | ORAL | Status: DC
  Administered 2017-11-05 – 2017-11-06 (×2): 1 via ORAL
  Filled 2017-11-05 (×2): qty 1

## 2017-11-05 MED ORDER — LOSARTAN POTASSIUM 50 MG PO TABS
50.0000 mg | ORAL_TABLET | Freq: Two times a day (BID) | ORAL | Status: DC
  Administered 2017-11-05 – 2017-11-06 (×2): 50 mg via ORAL
  Filled 2017-11-05 (×2): qty 1

## 2017-11-05 MED ORDER — METHOCARBAMOL 500 MG PO TABS: 500.0000 mg | ORAL_TABLET | Freq: Four times a day (QID) | ORAL | Status: DC | PRN

## 2017-11-05 MED ORDER — METOCLOPRAMIDE HCL 5 MG PO TABS: 5.0000 mg | ORAL_TABLET | Freq: Three times a day (TID) | ORAL | Status: DC | PRN

## 2017-11-05 MED ORDER — ONDANSETRON HCL 4 MG/2ML IJ SOLN: 4.0000 mg | Freq: Four times a day (QID) | INTRAMUSCULAR | Status: DC | PRN

## 2017-11-05 MED ORDER — ROCURONIUM BROMIDE 10 MG/ML (PF) SYRINGE
PREFILLED_SYRINGE | INTRAVENOUS | Status: DC | PRN
  Administered 2017-11-05: 60 mg via INTRAVENOUS

## 2017-11-05 MED FILL — OXYCODONE-APAP 10-325 MG TA: 10-325 | 5 days supply | Qty: 40 | Fill #0

## 2017-11-05 SURGICAL SUPPLY — 52 items
BIT DRILL 5/64X5 DISP (BIT) ×2 IMPLANT
BIT DRILL 7/64X5 DISP (BIT) ×2 IMPLANT
BLADE SAW SGTL MED 73X18.5 STR (BLADE) ×2 IMPLANT
CAPT SHLDR TOTAL 2 ×2 IMPLANT
CEMENT BONE R 1X40 (Cement) ×2 IMPLANT
CLSR STERI-STRIP ANTIMIC 1/2X4 (GAUZE/BANDAGES/DRESSINGS) ×2 IMPLANT
COVER SURGICAL LIGHT HANDLE (MISCELLANEOUS) ×2 IMPLANT
DRAPE HALF SHEET 40X57 (DRAPES) ×2 IMPLANT
DRAPE ORTHO SPLIT 77X108 STRL (DRAPES) ×2
DRAPE SURG ORHT 6 SPLT 77X108 (DRAPES) ×2 IMPLANT
DRAPE U-SHAPE 47X51 STRL (DRAPES) ×2 IMPLANT
DRSG MEPILEX BORDER 4X8 (GAUZE/BANDAGES/DRESSINGS) ×2 IMPLANT
DURAPREP 26ML APPLICATOR (WOUND CARE) ×2 IMPLANT
ELECT REM PT RETURN 9FT ADLT (ELECTROSURGICAL) ×2
ELECTRODE REM PT RTRN 9FT ADLT (ELECTROSURGICAL) ×1 IMPLANT
GLOVE BIOGEL PI ORTHO PRO SZ8 (GLOVE) ×2
GLOVE ORTHO TXT STRL SZ7.5 (GLOVE) ×2 IMPLANT
GLOVE PI ORTHO PRO STRL SZ8 (GLOVE) ×2 IMPLANT
GLOVE SURG ORTHO 8.0 STRL STRW (GLOVE) ×2 IMPLANT
GOWN STRL REUS W/ TWL XL LVL3 (GOWN DISPOSABLE) ×1 IMPLANT
GOWN STRL REUS W/TWL 2XL LVL3 (GOWN DISPOSABLE) ×2 IMPLANT
GOWN STRL REUS W/TWL XL LVL3 (GOWN DISPOSABLE) ×1
HANDPIECE INTERPULSE COAX TIP (DISPOSABLE) ×1
HOOD PEEL AWAY FACE SHEILD DIS (HOOD) ×2 IMPLANT
HOOD PEEL AWAY FLYTE STAYCOOL (MISCELLANEOUS) ×2 IMPLANT
KIT BASIN OR (CUSTOM PROCEDURE TRAY) ×2 IMPLANT
KIT ROOM TURNOVER OR (KITS) ×2 IMPLANT
MANIFOLD NEPTUNE II (INSTRUMENTS) ×2 IMPLANT
NS IRRIG 1000ML POUR BTL (IV SOLUTION) ×2 IMPLANT
PACK SHOULDER (CUSTOM PROCEDURE TRAY) ×2 IMPLANT
PAD ARMBOARD 7.5X6 YLW CONV (MISCELLANEOUS) ×4 IMPLANT
PIN HUMERAL STMN 3.2MMX9IN (INSTRUMENTS) ×2 IMPLANT
PIN STEINMANN THREADED TIP (PIN) ×2 IMPLANT
SET HNDPC FAN SPRY TIP SCT (DISPOSABLE) ×1 IMPLANT
SHOULDER CAPITATED TOTAL 2 ×1 IMPLANT
SLING ARM IMMOBILIZER LRG (SOFTGOODS) ×2 IMPLANT
SLING ARM IMMOBILIZER MED (SOFTGOODS) IMPLANT
SMARTMIX MINI TOWER (MISCELLANEOUS) ×2
SUCTION FRAZIER HANDLE 10FR (MISCELLANEOUS) ×1
SUCTION TUBE FRAZIER 10FR DISP (MISCELLANEOUS) ×1 IMPLANT
SUPPORT WRAP ARM LG (MISCELLANEOUS) ×2 IMPLANT
SUT FIBERWIRE #2 38 REV NDL BL (SUTURE) ×10
SUT VIC AB 0 CT1 27 (SUTURE) ×1
SUT VIC AB 0 CT1 27XBRD ANBCTR (SUTURE) ×1 IMPLANT
SUT VIC AB 2-0 CT1 27 (SUTURE) ×1
SUT VIC AB 2-0 CT1 TAPERPNT 27 (SUTURE) ×1 IMPLANT
SUT VIC AB 3-0 SH 8-18 (SUTURE) ×2 IMPLANT
SUTURE FIBERWR#2 38 REV NDL BL (SUTURE) ×5 IMPLANT
TOWEL OR 17X26 10 PK STRL BLUE (TOWEL DISPOSABLE) ×2 IMPLANT
TOWER SMARTMIX MINI (MISCELLANEOUS) ×1 IMPLANT
TUBE CONNECTING 12X1/4 (SUCTIONS) IMPLANT
YANKAUER SUCT BULB TIP NO VENT (SUCTIONS) ×2 IMPLANT

## 2017-11-05 NOTE — Anesthesia Procedure Notes (Signed)
Anesthesia Regional Block: Interscalene brachial plexus block   Pre-Anesthetic Checklist: ,, timeout performed, Correct Patient, Correct Site, Correct Laterality, Correct Procedure, Correct Position, site marked, Risks and benefits discussed,  Surgical consent,  Pre-op evaluation,  At surgeon's request and post-op pain management  Laterality: Right  Prep: chloraprep       Needles:  Injection technique: Single-shot  Needle Type: Echogenic Needle     Needle Length: 9cm  Needle Gauge: 21     Additional Needles:   Procedures:,,,, ultrasound used (permanent image in chart),,,,  Narrative:  Start time: 11/05/2017 9:15 AM End time: 11/05/2017 9:25 AM Injection made incrementally with aspirations every 5 mL.  Performed by: Personally  Anesthesiologist: Effie Berkshire, MD  Additional Notes: Patient tolerated the procedure well. Local anesthetic introduced in an incremental fashion under minimal resistance after negative aspirations. No paresthesias were elicited. After completion of the procedure, no acute issues were identified and patient continued to be monitored by RN.

## 2017-11-05 NOTE — Discharge Instructions (Signed)

## 2017-11-05 NOTE — Op Note (Signed)
11/05/2017  12:30 PM  PATIENT:  Jose [name].    PRE-OPERATIVE DIAGNOSIS:  DJD RIGHT SHOULDER  POST-OPERATIVE DIAGNOSIS:  Same  PROCEDURE:  TOTAL SHOULDER ARTHROPLASTY  SURGEON:  Johnny Bridge, MD  PHYSICIAN ASSISTANT: Joya Gaskins, OPA-C, present and scrubbed throughout the case, critical for completion in a timely fashion, and for retraction, instrumentation, and closure.  ANESTHESIA:   General with an interscalene block  ESTIMATED BLOOD LOSS: 150 mL  PREOPERATIVE INDICATIONS:  Jose [name]. is a  82 y.o. male with a diagnosis of DJD RIGHT SHOULDER who failed conservative measures and elected for surgical management.    The risks benefits and alternatives were discussed with the patient preoperatively including but not limited to the risks of infection, bleeding, nerve injury, cardiopulmonary complications, the need for revision surgery, dislocation, loosening, incomplete relief of pain, among others, and the patient was willing to proceed.  He has had a previous contralateral shoulder arthroplasty and has done very well.   OPERATIVE IMPLANTS: Biomet size 14 micro press-fit humeral stem, size 46+18 versa-dial humeral head, set in the E position with increased coverage posteriorly, with a medium cemented glenoid polyethylene 3 peg implant with a central regenerex noncemented post.   OPERATIVE FINDINGS: Advanced glenohumeral osteoarthritis involving the glenoid and the humeral head with substantial osteophyte formation inferiorly.  He had a fair amount of redundant anterior tissue, and his biceps tendon was extremely flattened and widened, 1 of the widest ones that I have ever seen.  The axillary nerve was palpated and intact at the completion of the case, although I had to release a fair amount of inferior tissue in order to gain mobility around the substantial inferior osteophyte.  All 4 holes in the glenoid were contained within the vault.   OPERATIVE PROCEDURE: The  patient was brought to the operating room and placed in the supine position. General anesthesia was administered. IV antibiotics were given.  The upper extremity was prepped and draped in usual sterile fashion. The patient was in a beachchair position with all bony prominences padded.  Midway through the prepping and draping, it appeared that he had a significant leak around his endotracheal tube.  He was laid back, and the intubation was evaluated and adjusted, and it appeared that the tube needed to be placed further down, which was fairly unusual, upon replacement of the tube the cuff work normally, and then we were able to sit and back up and complete the prepping and draping.  Sterile technique was maintained throughout the process.  Time out was performed and a deltopectoral approach was carried out. The biceps tendon was tenodesed to the pectoralis tendon. The subscapularis was released, tagging it with a #2 FiberWire, leaving a cuff of tendon for repair.  Inferiorly there was a substantial sleeve of tissue crossing to the humerus, I would leave this intact although at the completion of the case it looks like it probably had torn, or was damaged, but the axillary nerve felt intact.  The inferior osteophyte was removed, and release of the capsule off of the humeral side was completed. The head was dislocated with some difficulty, and I reamed sequentially. I placed the humeral cutting guide at 30 of retroversion, and then pinned this into place, and made my humeral neck cut. This was at the appropriate level.   I then placed deep retractors and exposed the glenoid. I excised the labrum circumferentially, taking care to protect the axillary nerve inferiorly.   I then  placed a guidewire into the center position, controlling appropriate version and inclination. I then reamed over the guidewire with the small reamer, and was satisfied with the preparation. I preserved the subchondral bone in order to  maximize the strength and minimize the risk for subsequent subsidence.   I then drilled the central hole for the regenerex peg, and then placed the guide, and then drilled the 3 peripheral peg holes. I had excellent bony circumferential contact.   I then cleaned the glenoid, irrigated it copiously, and then dried it and cemented the prosthesis into place. Excellent seating was achieved. I had full exposure. The cement cured while I turned my attention to the humeral side.   I sequentially broached, up to the selected size, with the broach set at 30 of retroversion. I placed 3 #2 Fiberwire through the bone for subsequent repair.  I then placed the real stem. I trialed with multiple heads, and the above-named component was selected. Increased posterior coverage improved the coverage. The soft tissue tension was appropriate.   I then impacted the real humeral head into place, reduced the head, and irrigated copiously. Excellent stability and range of motion was achieved. I repaired the subscapularis with a total of 5 #2 FiberWire; one for the interval, one for the corner, and then the remaining three from the lesser tuberosity which had already been passed.  Excellent repair achieved and I irrigated copiously once more. The subcutaneous tissue was closed with Vicryl including the deltopectoral fascia.   The skin was closed with Steri-Strips and sterile gauze was applied. He had a preoperative nerve block. He tolerated the procedure well and there were no complications.

## 2017-11-05 NOTE — Transfer of Care (Signed)
Immediate Anesthesia Transfer of Care Note  Patient: Jose [name].  Procedure(s) Performed: TOTAL SHOULDER ARTHROPLASTY (Right )  Patient Location: PACU  Anesthesia Type:GA combined with regional for post-op pain  Level of Consciousness: awake, alert  and oriented  Airway & Oxygen Therapy: Patient Spontanous Breathing and Patient connected to nasal cannula oxygen  Post-op Assessment: Report given to RN and Post -op Vital signs reviewed and stable  Post vital signs: Reviewed and stable  Last Vitals:  Vitals:   11/05/17 0937 11/05/17 1253  BP: 140/72 102/65  Pulse: 60 63  Resp: 15 15  Temp:  (!) 36.3 C  SpO2: 99% 93%    Last Pain:  Vitals:   11/05/17 1253  TempSrc:   PainSc: (P) 0-No pain      Patients Stated Pain Goal: 1 (58/09/98 3382)  Complications: No apparent anesthesia complications

## 2017-11-05 NOTE — Progress Notes (Signed)
Patient arrived to 2R42 A&Ox4, VSS, and IV intact and infusing.  Noted to have mepilex bandage on right shoulder and sling present on right arm.  Denies pain at this time.  Wife at bedside.  Patient and family oriented to room and equipment.  Will continue to monitor.

## 2017-11-05 NOTE — Anesthesia Preprocedure Evaluation (Addendum)
Anesthesia Evaluation  Patient identified by MRN, date of birth, ID band Patient awake    Reviewed: Allergy & Precautions, NPO status , Patient's Chart, lab work & pertinent test results  History of Anesthesia Complications (+) PONV and history of anesthetic complications  Airway Mallampati: II  TM Distance: >3 FB Neck ROM: Full    Dental  (+) Teeth Intact, Dental Advisory Given   Pulmonary sleep apnea and Continuous Positive Airway Pressure Ventilation , former smoker,    breath sounds clear to auscultation       Cardiovascular hypertension, Pt. on medications + dysrhythmias Atrial Fibrillation  Rhythm:Regular Rate:Normal     Neuro/Psych Seizures -,  TIA Neuromuscular disease CVA    GI/Hepatic negative GI ROS, Neg liver ROS,   Endo/Other  negative endocrine ROS  Renal/GU negative Renal ROS  negative genitourinary   Musculoskeletal  (+) Arthritis ,   Abdominal Normal abdominal exam  (+)   Peds  Hematology negative hematology ROS (+)   Anesthesia Other Findings   Reproductive/Obstetrics                            Lab Results  Component Value Date   WBC 6.2 10/25/2017   HGB 12.9 (L) 10/25/2017   HCT 40.1 10/25/2017   MCV 95.0 10/25/2017   PLT 171 10/25/2017   Lab Results  Component Value Date   CREATININE 0.72 10/25/2017   BUN 15 10/25/2017   NA 138 10/25/2017   K 3.9 10/25/2017   CL 106 10/25/2017   CO2 23 10/25/2017   Lab Results  Component Value Date   INR 1.23 07/19/2017   INR 1.48 12/14/2016   INR 1.35 11/09/2016   EKG: sinus bradycardia, 1st degree AV block.  Anesthesia Physical Anesthesia Plan  ASA: III  Anesthesia Plan: General   Post-op Pain Management:    Induction: Intravenous  PONV Risk Score and Plan: 4 or greater and Ondansetron, Dexamethasone and Treatment may vary due to age or medical condition  Airway Management Planned: Oral ETT  Additional  Equipment: None  Intra-op Plan:   Post-operative Plan: Extubation in OR  Informed Consent: I have reviewed the patients History and Physical, chart, labs and discussed the procedure including the risks, benefits and alternatives for the proposed anesthesia with the patient or authorized representative who has indicated his/her understanding and acceptance.   Dental advisory given  Plan Discussed with: CRNA  Anesthesia Plan Comments:         Anesthesia Quick Evaluation

## 2017-11-05 NOTE — Anesthesia Procedure Notes (Addendum)
Procedure Name: Intubation Date/Time: 11/05/2017 10:07 AM Performed by: Imagene Riches, CRNA Pre-anesthesia Checklist: Patient identified, Emergency Drugs available, Suction available and Patient being monitored Patient Re-evaluated:Patient Re-evaluated prior to induction Oxygen Delivery Method: Circle System Utilized Preoxygenation: Pre-oxygenation with 100% oxygen Induction Type: IV induction Ventilation: Mask ventilation without difficulty and Oral airway inserted - appropriate to patient size Laryngoscope Size: Sabra Heck and 2 Grade View: Grade III Tube type: Oral Tube size: 7.5 mm Number of attempts: 1 Airway Equipment and Method: Stylet and Oral airway Placement Confirmation: ETT inserted through vocal cords under direct vision,  positive ETCO2 and breath sounds checked- equal and bilateral Secured at: 28 cm Tube secured with: Tape Dental Injury: Teeth and Oropharynx as per pre-operative assessment  Difficulty Due To: Difficulty was anticipated Comments: Previous intubations with glide scope noted, and available on induction. DL x1 with miller 2 blade, grade 3 view, successful passing of ETT with +ETCO2 and BBS.   After patient moved to sitting position, significant leak noted. See quick note for details. BBS present when ETT secured at 28cm at teeth. Recommend future DL with miller 3 or glide scope blade 4.

## 2017-11-05 NOTE — Anesthesia Postprocedure Evaluation (Signed)
Anesthesia Post Note  Patient: Rajon Bisig.  Procedure(s) Performed: TOTAL SHOULDER ARTHROPLASTY (Right )     Patient location during evaluation: PACU Anesthesia Type: General and Regional Level of consciousness: awake and alert Pain management: pain level controlled Vital Signs Assessment: post-procedure vital signs reviewed and stable Respiratory status: spontaneous breathing, nonlabored ventilation, respiratory function stable and patient connected to nasal cannula oxygen Cardiovascular status: blood pressure returned to baseline and stable Postop Assessment: no apparent nausea or vomiting Anesthetic complications: no    Last Vitals:  Vitals:   11/05/17 1353 11/05/17 1409  BP: (!) 111/58 (!) 98/53  Pulse: 60 69  Resp: 14 17  Temp:    SpO2: 98% 98%    Last Pain:  Vitals:   11/05/17 1400  TempSrc:   PainSc: Asleep                 Effie Berkshire

## 2017-11-05 NOTE — H&P (Signed)
PREOPERATIVE H&P  Chief Complaint: DJD RIGHT SHOULDER  HPI: Jose Jokerst. is a 82 y.o. male who presents for preoperative history and physical with a diagnosis of DJD RIGHT SHOULDER. Symptoms are rated as moderate to severe, and have been worsening.  This is significantly impairing activities of daily living.  He has elected for surgical management.   He has failed injections, activity modification, anti-inflammatories, and assistive devices.  Preoperative X-rays demonstrate end stage degenerative changes with osteophyte formation, loss of joint space, subchondral sclerosis.  He has had a previous contralateral total shoulder replacement with excellent results and wishes for the same procedure.  Past Medical History:  Diagnosis Date  . Arthritis   . Dysrhythmia    afib  . Gallstones 07/1996  . History of CT scan of head 1997   small vessel disease  . History of ETT 05/2002    no EKG changes  . History of MRI of cervical spine 07/15/2002  . Hypertension   . Lumbar spondylolysis 12/17/2016  . MGUS (monoclonal gammopathy of unknown significance) 04/11/2012  . Peripheral neuropathy   . Pneumonia 10/2014  . PONV (postoperative nausea and vomiting)    once many years ago  . Primary localized osteoarthrosis of left shoulder 12/25/2016  . Rectal bleeding 12/2002   colonoscopy  . Seizures ([place])    last seizure 1976  . Skin cancer   . Sleep apnea    uses CPAP  . Spinal stenosis, lumbar region with neurogenic claudication   . Stroke [facility])    ?tia ?afib  . TIA (transient ischemic attack)   . 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 ADENOIDECTOMY    . ANTERIOR CERVICAL DECOMP/DISCECTOMY FUSION N/A 02/04/2015   Procedure: ANTERIOR CERVICAL DECOMPRESSION/DISCECTOMY FUSION CERVICAL FIVE-SIX,CERVICAL SIX-SEVEN;  Surgeon: Karie Chimera, MD;  Location: [facility];   Service: Neurosurgery;  Laterality: N/A;  . CATARACT EXTRACTION Bilateral   . COLONOSCOPY W/ POLYPECTOMY  04/19/2003   tubular adenoma  . EYE SURGERY     Bilateral Cataracts  . GREAT TOE ARTHRODESIS, INTERPHALANGEAL JOINT Left 1978  . JOINT REPLACEMENT     Left Shoulder  . joint replacement on right thumb    . keratosis excision Right 05/1998   wrist  . LUMBAR LAMINECTOMY/DECOMPRESSION MICRODISCECTOMY N/A 07/19/2017   Procedure: LAMINECTOMY AND FORAMINOTOMY  LUMBAR TWO- LUMBAR THREE, LUMBAR THREE- LUMBAR FOUR;  Surgeon: Ashok Pall, MD;  Location: [facility];  Service: Neurosurgery;  Laterality: N/A;  . nerve transfer surgery to lwft hand Left    Nov. 2017  . SHOULDER ARTHROSCOPY Left 15   rotator cuff   . SHOULDER INJECTION  June 2012   left, [place]  . SKIN CANCER EXCISION  07/1993   left lateral arm  . thumb injection  June 2012   left, [place]  . TONSILLECTOMY    . TOTAL SHOULDER ARTHROPLASTY Left 12/25/2016   Procedure: TOTAL SHOULDER ARTHROPLASTY;  Surgeon: Marchia Bond, MD;  Location: [facility];  Service: Orthopedics;  Laterality: Left;  . ulnar nerve transfer  12/1999   left  . ULNAR NERVE TRANSPOSITION Left    Social History   Socioeconomic History  . Marital status: Married    Spouse name: [name]  . Number of children: 2  . Years of education: 52  . Highest education level: None  Social Needs  . Financial resource strain:  None  . Food insecurity - worry: None  . Food insecurity - inability: None  . Transportation needs - medical: None  . Transportation needs - non-medical: None  Occupational History  . Occupation: retired    Fish farm manager: UNEMPLOYED  Tobacco Use  . Smoking status: Former Smoker    Years: 15.00    Last attempt to quit: 03/05/1968    Years since quitting: 49.7  . Smokeless tobacco: Never Used  Substance and Sexual Activity  . Alcohol use: Yes    Alcohol/week: 1.2 oz    Types: 2 Standard drinks or equivalent per week    Comment: 2-3 times  a month- will have a glass of wine  . Drug use: No  . Sexual activity: None  Other Topics Concern  . None  Social History Narrative   Lives with 4th wife, [name], married 2010   Retired, organizational psychology   [employer] [place], [place], [name], 2 children   Daughter, Dexter, in [place], 2 children   Patient is right handed   Patient drinks approximately 3-4 cups of caffeine daily   Family History  Problem Relation Age of Onset  . Dementia Mother 37       vascular  . Transient ischemic attack Mother   . Dementia Father        after hip fracture  . Hypertension Sister        skin cancer  . Seizures Neg Hx    Allergies  Allergen Reactions  . Maprotiline     TETRACYCLIC ANTIDEPRESSANTS UNSPECIFIED REACTION (patient is unaware of allergy)  . Tetracycline Hcl Itching and Rash    Rash on hands   Prior to Admission medications   Medication Sig Start Date End Date Taking? Authorizing Provider  amLODipine (NORVASC) 5 MG tablet Take 5 mg by mouth daily.   Yes [provider]  atorvastatin (LIPITOR) 10 MG tablet Take 10 mg by mouth daily.   Yes [provider]  ELIQUIS 5 MG TABS tablet TAKE 1 TABLET TWICE A DAY 03/26/17  Yes Kathrynn Ducking, MD  gabapentin (NEURONTIN) 300 MG capsule TAKE 1 CAPSULE TWICE DURING THE DAY AND TAKE 2 CAPSULES AT NIGHT Patient taking differently: Take 300-600 mg by mouth 4 (four) times daily. TAKE 1 CAPSULE TWICE DURING THE DAY AND TAKE 2 CAPSULES AT NIGHT 10/03/17  Yes Kathrynn Ducking, MD  labetalol (NORMODYNE) 200 MG tablet Take 100 mg by mouth 2 (two) times daily.   Yes [provider]  latanoprost (XALATAN) 0.005 % ophthalmic solution Place 1 drop into the right eye at bedtime. 08/01/17  Yes [provider]  losartan-hydrochlorothiazide (HYZAAR) 50-12.5 MG per tablet Take 1 tablet by mouth 2 (two) times daily.   Yes [provider]  Multiple Vitamin (MULTIVITAMIN WITH MINERALS) TABS  tablet Take 1 tablet by mouth daily. Centrum Silver   Yes [provider]  Multiple Vitamins-Minerals (PRESERVISION AREDS 2) CAPS Take 1 capsule by mouth 2 (two) times daily.    Yes [provider]  Omega-3 Fatty Acids (FISH OIL) 1000 MG CAPS Take 2,000 mg by mouth daily.    Yes [provider]  baclofen (LIORESAL) 10 MG tablet Take 1 tablet (10 mg total) by mouth 3 (three) times daily. As needed for muscle spasm Patient not taking: Reported on 07/12/2017 12/25/16   Marchia Bond, MD  meclizine (ANTIVERT) 25 MG tablet Take 1 tablet (25 mg total) by mouth 3 (three) times daily as needed. Patient not  taking: Reported on 07/12/2017 02/22/16   Tanna Furry, MD  ondansetron (ZOFRAN) 4 MG tablet Take 1 tablet (4 mg total) by mouth every 8 (eight) hours as needed for nausea or vomiting. Patient not taking: Reported on 07/12/2017 12/25/16   Marchia Bond, MD  oxyCODONE-acetaminophen (PERCOCET) 10-325 MG tablet Take 1-2 tablets by mouth every 6 (six) hours as needed for pain. MAXIMUM TOTAL ACETAMINOPHEN DOSE IS 4000 MG PER DAY Patient not taking: Reported on 07/12/2017 12/25/16   Marchia Bond, MD  sennosides-docusate sodium (SENOKOT-S) 8.6-50 MG tablet Take 2 tablets by mouth daily. Patient not taking: Reported on 07/12/2017 12/25/16   Marchia Bond, MD  traMADol (ULTRAM) 50 MG tablet Take 1 tablet (50 mg total) by mouth every 6 (six) hours as needed. Patient not taking: Reported on 07/12/2017 01/24/16   Kathrynn Ducking, MD     Positive ROS: All other systems have been reviewed and were otherwise negative with the exception of those mentioned in the HPI and as above.  Physical Exam: General: Alert, no acute distress Cardiovascular: No pedal edema Respiratory: No cyanosis, no use of accessory musculature GI: No organomegaly, abdomen is soft and non-tender Skin: No lesions in the area of chief complaint Neurologic: Sensation intact distally Psychiatric: Patient is competent for  consent with normal mood and affect Lymphatic: No axillary or cervical lymphadenopathy  MUSCULOSKELETAL: Right shoulder active motion 0-90 degrees of forward flexion with 20 degrees of abduction and 10 degrees of internal rotation with 20 degrees of external rotation.  Positive crepitance.  Cuff strength is fair but limited secondary to pain.  Assessment: DJD RIGHT SHOULDER   Plan: Plan for Procedure(s): TOTAL SHOULDER ARTHROPLASTY  The risks benefits and alternatives were discussed with the patient including but not limited to the risks of nonoperative treatment, versus surgical intervention including infection, bleeding, nerve injury,  blood clots, cardiopulmonary complications, morbidity, mortality, among others, and they were willing to proceed.   Johnny Bridge, MD Cell [phone]   11/05/2017 9:07 AM

## 2017-11-06 LAB — CBC
HEMATOCRIT: 32.4 % — AB (ref 39.0–52.0)
HEMOGLOBIN: 10.4 g/dL — AB (ref 13.0–17.0)
MCH: 30.6 pg (ref 26.0–34.0)
MCHC: 32.1 g/dL (ref 30.0–36.0)
MCV: 95.3 fL (ref 78.0–100.0)
Platelets: 201 10*3/uL (ref 150–400)
RBC: 3.4 MIL/uL — ABNORMAL LOW (ref 4.22–5.81)
RDW: 14.1 % (ref 11.5–15.5)
WBC: 9.9 10*3/uL (ref 4.0–10.5)

## 2017-11-06 LAB — BASIC METABOLIC PANEL
Anion gap: 10 (ref 5–15)
BUN: 28 mg/dL — ABNORMAL HIGH (ref 6–20)
CALCIUM: 8.5 mg/dL — AB (ref 8.9–10.3)
CO2: 25 mmol/L (ref 22–32)
Chloride: 104 mmol/L (ref 101–111)
Creatinine, Ser: 1.13 mg/dL (ref 0.61–1.24)
GFR calc non Af Amer: 59 mL/min — ABNORMAL LOW (ref >60)
Glucose, Bld: 155 mg/dL — ABNORMAL HIGH (ref 65–99)
POTASSIUM: 3.6 mmol/L (ref 3.5–5.1)
Sodium: 139 mmol/L (ref 135–145)

## 2017-11-06 NOTE — Evaluation (Signed)
Occupational Therapy Evaluation and Discharge Patient Details Name: Jose [name]. MRN: [id number] DOB: [birth year] Today's Date: 11/06/2017    History of Present Illness Pt is an 82 y.o. male s/p R TSA. PMHx: Arthritis, Dysrhythmia, HTN, Lumbar spondylolysis, Peripheral neuropathy, Seizures, Skin cancer, Sleep apnea, Spinal stenosis, CVA, TIA, Ulnar neuropathy at L elbow, ACDF in 2016, L TSA in 2018, Lumbar microdiscectomy in 2018.   Clinical Impression   Pt reports he was independent with ADL PTA. Currently pt requires supervision for functional mobility and min-mod assist for ADL. Educated pt on shoulder, ADL, and safety; pt verbalized understanding and able to recall much information from previous shoulder sx. Pt planning to d/c home with supervision from family. No further acute OT needs identified; signing off at this time. Please re-consult if needs change. Thank you for this referral.    Follow Up Recommendations  DC plan and follow up therapy as arranged by surgeon;Supervision/Assistance - 24 hour(initially)    Equipment Recommendations  None recommended by OT    Recommendations for Other Services       Precautions / Restrictions Precautions Precautions: Shoulder Type of Shoulder Precautions: Conservative protocol: AROM elbow, wrist, hand OK. NO A/PROM at shoulder. Shoulder Interventions: Shoulder sling/immobilizer;At all times Precaution Booklet Issued: No Required Braces or Orthoses: Sling Restrictions Weight Bearing Restrictions: Yes RUE Weight Bearing: Non weight bearing      Mobility Bed Mobility               General bed mobility comments: Pt sitting EOB upon arrival.  Transfers Overall transfer level: Needs assistance Equipment used: None Transfers: Sit to/from Stand Sit to Stand: Supervision         General transfer comment: for safety. Increased time but good technqiue    Balance Overall balance assessment: No apparent balance deficits (not  formally assessed)                                         ADL either performed or assessed with clinical judgement   ADL Overall ADL's : Needs assistance/impaired Eating/Feeding: Set up;Sitting   Grooming: Supervision/safety;Standing   Upper Body Bathing: Moderate assistance;Sitting   Lower Body Bathing: Minimal assistance;Sit to/from stand   Upper Body Dressing : Moderate assistance;Sitting   Lower Body Dressing: Minimal assistance;Sit to/from stand   Toilet Transfer: Supervision/safety;Ambulation           Functional mobility during ADLs: Supervision/safety General ADL Comments: Educated pt on proper positioning of RUE in bed/chair, sling management and wear schedule, UB bathing/dressing technqiue, RUE NWB, and RUE exercises. Pt recalls much information from previous TSA; wife to assist as needed upon return home.     Vision         Perception     Praxis      Pertinent Vitals/Pain Pain Assessment: 0-10 Pain Score: 3  Pain Location: R shoulder Pain Descriptors / Indicators: Sore Pain Intervention(s): Monitored during session;Repositioned;Ice applied     Hand Dominance Right   Extremity/Trunk Assessment Upper Extremity Assessment Upper Extremity Assessment: RUE deficits/detail RUE Deficits / Details: AROM elbow/wrist/hand WFL. RUE: Unable to fully assess due to immobilization   Lower Extremity Assessment Lower Extremity Assessment: Defer to PT evaluation   Cervical / Trunk Assessment Cervical / Trunk Assessment: Other exceptions Cervical / Trunk Exceptions: hx of spine surgery   Communication Communication Communication: No difficulties   Cognition Arousal/Alertness: Awake/alert Behavior During  Therapy: WFL for tasks assessed/performed Overall Cognitive Status: Within Functional Limits for tasks assessed                                     General Comments       Exercises Exercises: Shoulder Shoulder  Exercises Elbow Flexion: AROM;Right;10 reps;Standing Elbow Extension: AROM;Right;10 reps;Standing Wrist Flexion: AROM;Right;10 reps;Seated Wrist Extension: AROM;Right;10 reps;Seated Digit Composite Flexion: AROM;Right;10 reps;Seated Composite Extension: AROM;Right;10 reps;Seated   Shoulder Instructions Shoulder Instructions Donning/doffing shirt without moving shoulder: Moderate assistance;Patient able to independently direct caregiver Method for sponge bathing under operated UE: Moderate assistance;Patient able to independently direct caregiver Donning/doffing sling/immobilizer: Moderate assistance;Patient able to independently direct caregiver Correct positioning of sling/immobilizer: Supervision/safety;Patient able to independently direct caregiver ROM for elbow, wrist and digits of operated UE: Supervision/safety Sling wearing schedule (on at all times/off for ADL's): Supervision/safety Proper positioning of operated UE when showering: Supervision/safety Positioning of UE while sleeping: [place] expects to be discharged to:: Private residence Living Arrangements: Spouse/significant other Available Help at Discharge: Family Type of Home: House Home Access: Stairs to enter Technical brewer of Steps: 1 Entrance Stairs-Rails: None Home Layout: One level     Bathroom Shower/Tub: Occupational psychologist: [place]: Environmental consultant - 2 wheels;Cane - single point          Prior Functioning/Environment Level of Independence: Independent                 OT Problem List:        OT Treatment/Interventions:      OT Goals(Current goals can be found in the care plan section) Acute Rehab OT Goals Patient Stated Goal: return home OT Goal Formulation: All assessment and education complete, DC therapy  OT Frequency:     Barriers to D/C:            Co-evaluation              AM-PAC PT "6 Clicks"  Daily Activity     Outcome Measure Help from another person eating meals?: None Help from another person taking care of personal grooming?: A Little Help from another person toileting, which includes using toliet, bedpan, or urinal?: A Little Help from another person bathing (including washing, rinsing, drying)?: A Lot Help from another person to put on and taking off regular upper body clothing?: A Lot Help from another person to put on and taking off regular lower body clothing?: A Little 6 Click Score: 17   End of Session Equipment Utilized During Treatment: Other (comment)(sling)  Activity Tolerance: Patient tolerated treatment well Patient left: in chair;with call bell/phone within reach  OT Visit Diagnosis: Pain Pain - Right/Left: Right Pain - part of body: Shoulder                Time: 8295-6213 OT Time Calculation (min): 33 min Charges:  OT General Charges $OT Visit: 1 Visit OT Evaluation $OT Eval Moderate Complexity: 1 Mod OT Treatments $Self Care/Home Management : 8-22 mins G-Codes:     Preslee Regas A. Ulice Brilliant, M.S., OTR/L Pager: [place] 11/06/2017, 9:12 AM

## 2017-11-06 NOTE — Evaluation (Signed)
Physical Therapy Evaluation Patient Details Name: Jose [name]. MRN: [id number] DOB: 1936-08-13 Today's Date: 11/06/2017   History of Present Illness  Pt is an 82 y.o. male s/p R TSA. PMHx: Arthritis, Dysrhythmia, HTN, Lumbar spondylolysis, Peripheral neuropathy, Seizures, Skin cancer, Sleep apnea, Spinal stenosis, CVA, TIA, Ulnar neuropathy at L elbow, ACDF in 2016, L TSA in 2018, Lumbar microdiscectomy in 2018.  Clinical Impression   Patient received up in chair, pleasant and willing to participate in skilled PT services. His strength, functional transfers, and gait appear to be [facility] however S was provided throughout session for safety today. Note very mild unsteadiness with gait however patient is able to self correct and independently navigates around obstacles in the hallway. Patient does not appear to be in need of skilled PT services at this time as he is at baseline level of function- PT signing off, thank you for the referral.     Follow Up Recommendations No PT follow up    Equipment Recommendations  None recommended by PT    Recommendations for Other Services       Precautions / Restrictions Precautions Precautions: Shoulder Type of Shoulder Precautions: Conservative protocol: AROM elbow, wrist, hand OK. NO A/PROM at shoulder. Shoulder Interventions: Shoulder sling/immobilizer;At all times Precaution Booklet Issued: No Required Braces or Orthoses: Sling Restrictions Weight Bearing Restrictions: Yes RUE Weight Bearing: Non weight bearing      Mobility  Bed Mobility               General bed mobility comments: DNT, patient received up in chair   Transfers Overall transfer level: Needs assistance Equipment used: None Transfers: Sit to/from Stand Sit to Stand: Supervision         General transfer comment: S for safety  Ambulation/Gait Ambulation/Gait assistance: Supervision Ambulation Distance (Feet): 500 Feet Assistive device: None Gait  Pattern/deviations: WFL(Within Functional Limits)     General Gait Details: very mild unsteadiness, patient able to self correct and with good safety awareness during gait/able to independently navigate around obstacles in hallway   Stairs            Wheelchair Mobility    Modified Rankin (Stroke Patients Only)       Balance Overall balance assessment: No apparent balance deficits (not formally assessed)                                           Pertinent Vitals/Pain Pain Assessment: 0-10 Pain Score: 3  Pain Location: R shoulder Pain Descriptors / Indicators: Sore Pain Intervention(s): Limited activity within patient's tolerance;Monitored during session    Home Living Family/patient expects to be discharged to:: Private residence Living Arrangements: Spouse/significant other Available Help at Discharge: Family Type of Home: House Home Access: Stairs to enter Entrance Stairs-Rails: None Entrance Stairs-Number of Steps: 1 Home Layout: One level Home Equipment: Environmental consultant - 2 wheels;Kasandra Knudsen - single point Additional Comments: sold house and closed on a new house but currenlty in apartment    Prior Function Level of Independence: Independent         Comments: independent; PRN use of SPC and RW secondary to pain      Hand Dominance   Dominant Hand: Right    Extremity/Trunk Assessment   Upper Extremity Assessment Upper Extremity Assessment: Defer to OT evaluation RUE Deficits / Details: AROM elbow/wrist/hand WFL. RUE: Unable to fully assess due to immobilization  Lower Extremity Assessment Lower Extremity Assessment: Overall WFL for tasks assessed    Cervical / Trunk Assessment Cervical / Trunk Assessment: Normal Cervical / Trunk Exceptions: hx of spine surgery  Communication   Communication: No difficulties  Cognition Arousal/Alertness: Awake/alert Behavior During Therapy: WFL for tasks assessed/performed Overall Cognitive Status:  Within Functional Limits for tasks assessed                                        General Comments   Exercises    Assessment/Plan    PT Assessment [place] does not need any further PT services  PT Problem List         PT Treatment Interventions      PT Goals (Current goals can be found in the Care Plan section)  Acute Rehab PT Goals Patient Stated Goal: return home PT Goal Formulation: With patient Time For Goal Achievement: 11/13/17 Potential to Achieve Goals: Good    Frequency     Barriers to discharge        Co-evaluation               AM-PAC PT "6 Clicks" Daily Activity  Outcome Measure Difficulty turning over in bed (including adjusting bedclothes, sheets and blankets)?: None Difficulty moving from lying on back to sitting on the side of the bed? : None Difficulty sitting down on and standing up from a chair with arms (e.g., wheelchair, bedside commode, etc,.)?: None Help needed moving to and from a bed to chair (including a wheelchair)?: None Help needed walking in hospital room?: None Help needed climbing 3-5 steps with a railing? : A Little 6 Click Score: 23    End of Session   Activity Tolerance: Patient tolerated treatment well Patient left: in chair;with call bell/phone within reach   PT Visit Diagnosis: Pain Pain - Right/Left: Right Pain - part of body: Shoulder    Time: 1601-0932 PT Time Calculation (min) (ACUTE ONLY): 15 min   Charges:   PT Evaluation $PT Eval Low Complexity: 1 Low     PT G Codes:   PT G-Codes **NOT FOR INPATIENT CLASS** Functional Assessment Tool Used: AM-PAC 6 Clicks Basic Mobility;Clinical judgement    Deniece Ree PT, DPT, CBIS  Supplemental Physical Therapist [place]   Pager [phone]

## 2017-11-06 NOTE — Progress Notes (Signed)
Jose [name]. discharged per MD order. Discussed with the patient and all questions fully answered.  VSS, Skin clean, dry and intact without evidence of skin break down, no evidence of skin tears noted.  IV catheter discontinued intact. Site without signs and symptoms of complications. Dressing and pressure applied.  An After Visit Summary was printed and given to the patient. Patient received prescription.  Discharge education completed with patient/family including follow up instructions, medication list, d/c activities limitations if indicated, with other d/c instructions as indicated by MD - patient able to verbalize understanding, all questions fully answered.   Patient instructed to return to ED, call 911, or call MD for any changes in condition.   Patient discharged home via private auto.

## 2017-11-06 NOTE — Discharge Summary (Signed)
Physician Discharge Summary  Patient ID: Jose [name]. MRN: [id number] DOB/AGE: Apr 24, 1936 82 y.o.  Admit date: 11/05/2017 Discharge date: 11/06/2017  Admission Diagnoses:  Primary localized osteoarthrosis of right shoulder  Discharge Diagnoses:  Principal Problem:   Primary localized osteoarthrosis of right shoulder Active Problems:   Primary localized osteoarthrosis of shoulder   Past Medical History:  Diagnosis Date  . Arthritis   . Dysrhythmia    afib  . Gallstones 07/1996  . History of CT scan of head 1997   small vessel disease  . History of ETT 05/2002    no EKG changes  . History of MRI of cervical spine 07/15/2002  . Hypertension   . Lumbar spondylolysis 12/17/2016  . MGUS (monoclonal gammopathy of unknown significance) 04/11/2012  . Peripheral neuropathy   . Pneumonia 10/2014  . PONV (postoperative nausea and vomiting)    once many years ago  . Primary localized osteoarthrosis of left shoulder 12/25/2016  . Primary localized osteoarthrosis of right shoulder 11/05/2017  . Rectal bleeding 12/2002   colonoscopy  . Seizures ([place])    last seizure 1976  . Skin cancer   . Sleep apnea    uses CPAP  . Spinal stenosis, lumbar region with neurogenic claudication   . Stroke [facility])    ?tia ?afib  . TIA (transient ischemic attack)   . 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 extremity 03/29/2015    Surgeries: Procedure(s): TOTAL SHOULDER ARTHROPLASTY on 11/05/2017   Consultants (if any):   Discharged Condition: Improved  Hospital Course: Jose [name]. is an 82 y.o. male who was admitted 11/05/2017 with a diagnosis of Primary localized osteoarthrosis of right shoulder and went to the operating room on 11/05/2017 and underwent the above named procedures.    He was given perioperative antibiotics:  Anti-infectives (From admission, onward)   Start     Dose/Rate Route Frequency Ordered Stop   11/05/17 1630  ceFAZolin (ANCEF)  IVPB 1 g/50 mL premix     1 g 100 mL/hr over 30 Minutes Intravenous Every 6 hours 11/05/17 1257 11/06/17 0523   11/05/17 0820  ceFAZolin (ANCEF) 2-4 GM/100ML-% IVPB    Comments:  Scronce, Trina   : cabinet override      11/05/17 0820 11/05/17 1012   11/05/17 0814  ceFAZolin (ANCEF) IVPB 2g/100 mL premix     2 g 200 mL/hr over 30 Minutes Intravenous On call to O.R. 11/05/17 [id number] 11/05/17 1012    .  He was given sequential compression devices, early ambulation, and eliquis for DVT prophylaxis.  He benefited maximally from the hospital stay and there were no complications.  All fingers flex extend and abduct and his axillary nerve seems to be working postoperative day 1.  His nerve block is still working to some degree with some sensory deficit radially, but overall intact.  Recent vital signs:  Vitals:   11/06/17 0211 11/06/17 0521  BP: 112/62 (!) 98/53  Pulse: 98 66  Resp: 17 17  Temp: 98.5 F (36.9 C) 98.2 F (36.8 C)  SpO2: 98% 96%    Recent laboratory studies:  Lab Results  Component Value Date   HGB 10.4 (L) 11/06/2017   HGB 12.9 (L) 10/25/2017   HGB 12.7 (L) 07/16/2017   Lab Results  Component Value Date   WBC 9.9 11/06/2017   PLT 201 11/06/2017   Lab Results  Component Value Date   INR 1.23 07/19/2017  Lab Results  Component Value Date   NA 139 11/06/2017   K 3.6 11/06/2017   CL 104 11/06/2017   CO2 25 11/06/2017   BUN 28 (H) 11/06/2017   CREATININE 1.13 11/06/2017   GLUCOSE 155 (H) 11/06/2017    Discharge Medications:   Allergies as of 11/06/2017      Reactions   Maprotiline    TETRACYCLIC ANTIDEPRESSANTS UNSPECIFIED REACTION (patient is unaware of allergy)   Tetracycline Hcl Itching, Rash   Rash on hands      Medication List    TAKE these medications   amLODipine 5 MG tablet Commonly known as:  NORVASC Take 5 mg by mouth daily.   atorvastatin 10 MG tablet Commonly known as:  LIPITOR Take 10 mg by mouth daily.   baclofen 10 MG  tablet Commonly known as:  LIORESAL Take 1 tablet (10 mg total) by mouth 3 (three) times daily. As needed for muscle spasm   ELIQUIS 5 MG Tabs tablet Generic drug:  apixaban TAKE 1 TABLET TWICE A DAY   Fish Oil 1000 MG Caps Take 2,000 mg by mouth daily.   gabapentin 300 MG capsule Commonly known as:  NEURONTIN TAKE 1 CAPSULE TWICE DURING THE DAY AND TAKE 2 CAPSULES AT NIGHT What changed:    how much to take  how to take this  when to take this  additional instructions   labetalol 200 MG tablet Commonly known as:  NORMODYNE Take 100 mg by mouth 2 (two) times daily.   latanoprost 0.005 % ophthalmic solution Commonly known as:  XALATAN Place 1 drop into the right eye at bedtime.   losartan-hydrochlorothiazide 50-12.5 MG tablet Commonly known as:  HYZAAR Take 1 tablet by mouth 2 (two) times daily.   meclizine 25 MG tablet Commonly known as:  ANTIVERT Take 1 tablet (25 mg total) by mouth 3 (three) times daily as needed.   multivitamin with minerals Tabs tablet Take 1 tablet by mouth daily. Centrum Silver   ondansetron 4 MG tablet Commonly known as:  ZOFRAN Take 1 tablet (4 mg total) by mouth every 8 (eight) hours as needed for nausea or vomiting.   oxyCODONE-acetaminophen 10-325 MG tablet Commonly known as:  PERCOCET Take 1-2 tablets by mouth every 6 (six) hours as needed for pain. MAXIMUM TOTAL ACETAMINOPHEN DOSE IS 4000 MG PER DAY   PRESERVISION AREDS 2 Caps Take 1 capsule by mouth 2 (two) times daily.   sennosides-docusate sodium 8.6-50 MG tablet Commonly known as:  SENOKOT-S Take 2 tablets by mouth daily.   traMADol 50 MG tablet Commonly known as:  ULTRAM Take 1 tablet (50 mg total) by mouth every 6 (six) hours as needed.       Diagnostic Studies: Dg Shoulder Right Port  Result Date: 11/05/2017 CLINICAL DATA:  Right shoulder arthroplasty. EXAM: PORTABLE RIGHT SHOULDER COMPARISON:  CT right shoulder dated July 24, 2016. FINDINGS: Postsurgical  changes related to interval right total shoulder arthroplasty. Components are well aligned. No fracture or dislocation. Moderate acromioclavicular osteoarthritis. Expected postsurgical subcutaneous emphysema in the surrounding soft tissues. IMPRESSION: Interval right total shoulder arthroplasty without evidence of acute postoperative complication. Electronically Signed   By: Titus Dubin M.D.   On: 11/05/2017 13:37    Disposition: 01-Home or Self Care    Follow-up Information    Marchia Bond, MD. Schedule an appointment as soon as possible for a visit in 2 weeks.   Specialty:  Orthopedic Surgery Contact information: [address] [place] [address]  [phone]            Signed: Johnny Bridge 11/06/2017, 8:07 AM

## 2017-11-07 ENCOUNTER — Emergency Department (HOSPITAL_BASED_OUTPATIENT_CLINIC_OR_DEPARTMENT_OTHER): Payer: Medicare Other

## 2017-11-07 ENCOUNTER — Emergency Department (HOSPITAL_BASED_OUTPATIENT_CLINIC_OR_DEPARTMENT_OTHER)
Admission: EM | Admit: 2017-11-07 | Discharge: 2017-11-07 | Disposition: A | Discharge status: Home / Self Care | Payer: Medicare Other | Attending: Emergency Medicine | Admitting: Emergency Medicine

## 2017-11-07 ENCOUNTER — Other Ambulatory Visit: Payer: Self-pay

## 2017-11-07 ENCOUNTER — Encounter (HOSPITAL_BASED_OUTPATIENT_CLINIC_OR_DEPARTMENT_OTHER): Payer: Self-pay | Admitting: *Deleted

## 2017-11-07 DIAGNOSIS — R05 Cough: Secondary | ICD-10-CM | POA: Diagnosis present

## 2017-11-07 DIAGNOSIS — Z7901 Long term (current) use of anticoagulants: Secondary | ICD-10-CM | POA: Insufficient documentation

## 2017-11-07 DIAGNOSIS — Z96611 Presence of right artificial shoulder joint: Secondary | ICD-10-CM | POA: Insufficient documentation

## 2017-11-07 DIAGNOSIS — Z79899 Other long term (current) drug therapy: Secondary | ICD-10-CM | POA: Diagnosis not present

## 2017-11-07 DIAGNOSIS — Z96612 Presence of left artificial shoulder joint: Secondary | ICD-10-CM | POA: Insufficient documentation

## 2017-11-07 DIAGNOSIS — J40 Bronchitis, not specified as acute or chronic: Secondary | ICD-10-CM

## 2017-11-07 DIAGNOSIS — Z85828 Personal history of other malignant neoplasm of skin: Secondary | ICD-10-CM | POA: Diagnosis not present

## 2017-11-07 DIAGNOSIS — I10 Essential (primary) hypertension: Secondary | ICD-10-CM | POA: Insufficient documentation

## 2017-11-07 DIAGNOSIS — Z87891 Personal history of nicotine dependence: Secondary | ICD-10-CM | POA: Insufficient documentation

## 2017-11-07 MED ORDER — DM-GUAIFENESIN ER 30-600 MG PO TB12: 1.0000 | ORAL_TABLET | Freq: Two times a day (BID) | ORAL | 1 refills | Status: DC

## 2017-11-07 NOTE — Discharge Instructions (Signed)
Chest x-ray here today without signs of pneumonia.  Return for any new or worse symptoms.  Recommend Mucinex to help with the cough and congestion.  Take as directed.

## 2017-11-07 NOTE — ED Triage Notes (Signed)
Cough and chest congestion today. He was discharged yesterday after having right shoulder surgery.

## 2017-11-07 NOTE — ED Provider Notes (Signed)
[place] EMERGENCY DEPARTMENT Provider Note   CSN: [id number] Arrival date & time: 11/07/17  1720     History   Chief Complaint Chief Complaint  Patient presents with  . Cough    HPI Jose [name]. is a 82 y.o. male.  Patient status post right shoulder replacement was hospitalized from January 8 through January.  Patient did undergo general anesthesia with intubation for the procedure.  Patient has his right arm in a sling his concern is is that he has developed a cough congestion productive cough since the surgery.  Does have some throat irritation but he says that is probably due to the endotracheal tube is had that before.  Patient has gotten pneumonia in the past so he was concerned whether he was coming down with pneumonia.  No fevers.  Oxygen saturations here have been good upper 90s.  On room air.  Patient without any significant shortness of breath      Past Medical History:  Diagnosis Date  . Arthritis   . Dysrhythmia    afib  . Gallstones 07/1996  . History of CT scan of head 1997   small vessel disease  . History of ETT 05/2002    no EKG changes  . History of MRI of cervical spine 07/15/2002  . Hypertension   . Lumbar spondylolysis 12/17/2016  . MGUS (monoclonal gammopathy of unknown significance) 04/11/2012  . Peripheral neuropathy   . Pneumonia 10/2014  . PONV (postoperative nausea and vomiting)    once many years ago  . Primary localized osteoarthrosis of left shoulder 12/25/2016  . Primary localized osteoarthrosis of right shoulder 11/05/2017  . Rectal bleeding 12/2002   colonoscopy  . Seizures ([place])    last seizure 1976  . Skin cancer   . Sleep apnea    uses CPAP  . Spinal stenosis, lumbar region with neurogenic claudication   . Stroke [facility])    ?tia ?afib  . TIA (transient ischemic attack)   . 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 extremity 03/29/2015    Patient Active Problem List   Diagnosis Date Noted  . Primary localized osteoarthrosis of right shoulder 11/05/2017  . Primary localized osteoarthrosis of shoulder 11/05/2017  . Lumbar stenosis with neurogenic claudication 07/19/2017  . Primary localized osteoarthrosis of left shoulder 12/25/2016  . S/P shoulder replacement 12/25/2016  . Lumbar spondylolysis 12/17/2016  . Paresthesia 01/24/2016  . 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 extremity 03/29/2015  . Cervical spinal stenosis 02/04/2015  . Bronchiectasis without acute exacerbation ([place]) 01/11/2015  . Ulnar neuropathy at elbow 12/24/2014  . TIA (transient ischemic attack) 12/15/2014  . Hyperlipidemia 11/24/2014  . Hypoxia   . New onset a-fib ([place])   . MGUS (monoclonal gammopathy of unknown significance) 04/11/2012  . SEBORRHEIC KERATOSIS 11/09/2010  . CPK, ABNORMAL 09/03/2010  . DERMATOPHYTOSIS OF FOOT 08/03/2010  . MYALGIA 12/27/2009  . GOUT, UNSPECIFIED 10/04/2009  . HYPERTROPHY PROSTATE W/O UR OBST & OTH LUTS 12/14/2008  . ALLERGIC CONJUNCTIVITIS 12/03/2007  . TESTOSTERONE DEFICIENCY 11/14/2007  . Backache 04/22/2007  . HYPERLIPIDEMIA 12/26/2006  . OBESITY, NOS 12/26/2006  . 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 Hereditary and idiopathic peripheral neuropathy 12/26/2006  . HEARING LOSS NOS OR DEAFNESS 12/26/2006  . HYPERTENSION, BENIGN SYSTEMIC 12/26/2006  . OSTEOARTHRITIS, MULTI SITES 12/26/2006  . Musculoskeletal disorder and symptoms referable to neck 12/26/2006    Past Surgical History:  Procedure Laterality Date  .  ANTERIOR CERVICAL DECOMP/DISCECTOMY FUSION N/A 02/04/2015   Procedure: ANTERIOR CERVICAL DECOMPRESSION/DISCECTOMY FUSION CERVICAL FIVE-SIX,CERVICAL SIX-SEVEN;  Surgeon: Karie Chimera, MD;  Location: [place] [facility];  Service: Neurosurgery;  Laterality: N/A;  . BACK SURGERY    . CATARACT EXTRACTION Bilateral   . COLONOSCOPY W/ POLYPECTOMY  04/19/2003   tubular adenoma  . EYE SURGERY     Bilateral Cataracts  . GREAT TOE ARTHRODESIS,  INTERPHALANGEAL JOINT Left 1978  . JOINT REPLACEMENT    . joint replacement on right thumb Right   . LUMBAR LAMINECTOMY/DECOMPRESSION MICRODISCECTOMY N/A 07/19/2017   Procedure: LAMINECTOMY AND FORAMINOTOMY  LUMBAR TWO- LUMBAR THREE, LUMBAR THREE- LUMBAR FOUR;  Surgeon: Ashok Pall, MD;  Location: [place];  Service: Neurosurgery;  Laterality: N/A;  . nerve transfer surgery to lwft hand Left    Nov. 2017  . SHOULDER ARTHROSCOPY W/ ROTATOR CUFF REPAIR Left 15   rotator cuff   . SHOULDER INJECTION  June 2012   left, [place]  . SKIN CANCER EXCISION  07/1993   left lateral arm  . SUPERFICIAL KERATECTOMY Right 05/1998   wrist  . thumb injection  June 2012   left, [place]  . TONSILLECTOMY AND ADENOIDECTOMY    . TOTAL SHOULDER ARTHROPLASTY Left 12/25/2016   Procedure: TOTAL SHOULDER ARTHROPLASTY;  Surgeon: Marchia Bond, MD;  Location: [place];  Service: Orthopedics;  Laterality: Left;  . TOTAL SHOULDER ARTHROPLASTY Right 11/05/2017  . ulnar nerve transfer  12/1999   left  . ULNAR NERVE TRANSPOSITION Left        Home Medications    Prior to Admission medications   Medication Sig Start Date End Date Taking? Authorizing Provider  amLODipine (NORVASC) 5 MG tablet Take 5 mg by mouth daily.    [provider]  atorvastatin (LIPITOR) 10 MG tablet Take 10 mg by mouth daily.    [provider]  baclofen (LIORESAL) 10 MG tablet Take 1 tablet (10 mg total) by mouth 3 (three) times daily. As needed for muscle spasm Patient not taking: Reported on 07/12/2017 12/25/16   Marchia Bond, MD  dextromethorphan-guaiFENesin [facility] DM) 30-600 MG 12hr tablet Take 1 tablet by mouth 2 (two) times daily. 11/07/17   Fredia Sorrow, MD  ELIQUIS 5 MG TABS tablet TAKE 1 TABLET TWICE A DAY 03/26/17   Kathrynn Ducking, MD  gabapentin (NEURONTIN) 300 MG capsule TAKE 1 CAPSULE TWICE DURING THE DAY AND TAKE 2 CAPSULES AT NIGHT Patient taking differently: Take 300-600 mg by mouth 4 (four) times daily. TAKE  1 CAPSULE TWICE DURING THE DAY AND TAKE 2 CAPSULES AT NIGHT 10/03/17   Kathrynn Ducking, MD  labetalol (NORMODYNE) 200 MG tablet Take 100 mg by mouth 2 (two) times daily.    [provider]  latanoprost (XALATAN) 0.005 % ophthalmic solution Place 1 drop into the right eye at bedtime. 08/01/17   [provider]  losartan-hydrochlorothiazide (HYZAAR) 50-12.5 MG per tablet Take 1 tablet by mouth 2 (two) times daily.    [provider]  meclizine (ANTIVERT) 25 MG tablet Take 1 tablet (25 mg total) by mouth 3 (three) times daily as needed. Patient not taking: Reported on 07/12/2017 02/22/16   Tanna Furry, MD  Multiple Vitamin (MULTIVITAMIN WITH MINERALS) TABS tablet Take 1 tablet by mouth daily. Centrum Silver    [provider]  Multiple Vitamins-Minerals (PRESERVISION AREDS 2) CAPS Take 1 capsule by mouth 2 (two) times daily.     [provider]  Omega-3 Fatty Acids (FISH OIL) 1000 MG CAPS  Take 2,000 mg by mouth daily.     [provider]  ondansetron (ZOFRAN) 4 MG tablet Take 1 tablet (4 mg total) by mouth every 8 (eight) hours as needed for nausea or vomiting. Patient not taking: Reported on 07/12/2017 12/25/16   Marchia Bond, MD  oxyCODONE-acetaminophen (PERCOCET) 10-325 MG tablet Take 1-2 tablets by mouth every 6 (six) hours as needed for pain. MAXIMUM TOTAL ACETAMINOPHEN DOSE IS 4000 MG PER DAY 11/05/17   Marchia Bond, MD  sennosides-docusate sodium (SENOKOT-S) 8.6-50 MG tablet Take 2 tablets by mouth daily. Patient not taking: Reported on 07/12/2017 12/25/16   Marchia Bond, MD  traMADol (ULTRAM) 50 MG tablet Take 1 tablet (50 mg total) by mouth every 6 (six) hours as needed. Patient not taking: Reported on 07/12/2017 01/24/16   Kathrynn Ducking, MD    Family History Family History  Problem Relation Age of Onset  . Dementia Mother 56       vascular  . Transient ischemic attack Mother   . Dementia Father        after hip fracture  .  Hypertension Sister        skin cancer  . Seizures Neg Hx     Social History Social History   Tobacco Use  . Smoking status: Former Smoker    Packs/day: 2.00    Years: 13.00    Pack years: 26.00    Types: Cigarettes    Last attempt to quit: 03/05/1968    Years since quitting: 49.7  . Smokeless tobacco: Never Used  Substance Use Topics  . Alcohol use: Yes    Alcohol/week: 1.2 oz    Types: 2 Glasses of wine per week    Comment: 11/05/2017 "a beer and couple fingers of whiskey q now and then"  . Drug use: No     Allergies   Maprotiline and Tetracycline hcl   Review of Systems Review of Systems  Constitutional: Negative for fever.  HENT: Positive for congestion.   Eyes: Negative for redness.  Respiratory: Positive for cough. Negative for shortness of breath and wheezing.   Cardiovascular: Negative for chest pain.  Gastrointestinal: Negative for abdominal pain.  Genitourinary: Negative for dysuria.  Musculoskeletal: Positive for joint swelling.  Neurological: Negative for headaches.  Hematological: Does not bruise/bleed easily.  Psychiatric/Behavioral: Negative for confusion.     Physical Exam Updated Vital Signs BP 117/66 (BP Location: Left Arm)   Pulse (!) 58   Temp 97.8 F (36.6 C) (Oral)   Resp 20   Ht 1.829 m (6')   Wt 105.7 kg (233 lb)   SpO2 96%   BMI 31.60 kg/m   Physical Exam  Constitutional: He is oriented to person, place, and time. He appears well-developed and well-nourished. No distress.  HENT:  Head: Normocephalic and atraumatic.  Mouth/Throat: Oropharynx is clear and moist. No oropharyngeal exudate.  Eyes: Conjunctivae and EOM are normal. Pupils are equal, round, and reactive to light.  Neck: Normal range of motion. Neck supple.  Cardiovascular: Normal rate, regular rhythm and normal heart sounds.  Pulmonary/Chest: Effort normal and breath sounds normal. No respiratory distress. He has no wheezes. He has no rales.  Abdominal: Soft. Bowel  sounds are normal.  Musculoskeletal: He exhibits tenderness.  Right arm and shoulder immobilizer.  Neurological: He is alert and oriented to person, place, and time. No cranial nerve deficit or sensory deficit. He exhibits normal muscle tone. Coordination normal.  Skin: Skin is warm. No rash noted.  Nursing note and  vitals reviewed.    ED Treatments / Results  Labs (all labs ordered are listed, but only abnormal results are displayed) Labs Reviewed - No data to display  EKG  EKG Interpretation None       Radiology Dg Chest 2 View  Result Date: 11/07/2017 CLINICAL DATA:  Cough and congestion 1 day. Right shoulder surgery 2 days ago. EXAM: CHEST  2 VIEW COMPARISON:  11/01/2015 FINDINGS: Lungs are adequately inflated without focal airspace consolidation or effusion. Mild cardiomegaly. Partially visualized left shoulder prosthesis. Partially visualized hardware over the right shoulder. Degenerative change of the spine. Fusion hardware intact over the lower cervical spine. IMPRESSION: No active cardiopulmonary disease. Electronically Signed   By: Marin Olp M.D.   On: 11/07/2017 17:56    Procedures Procedures (including critical care time)  Medications Ordered in ED Medications - No data to display   Initial Impression / Assessment and Plan / ED Course  I have reviewed the triage vital signs and the nursing notes.  Pertinent labs & imaging results that were available during my care of the patient were reviewed by me and considered in my medical decision making (see chart for details).    Patient nontoxic no acute distress.  Lung fields are clear bilaterally.  Chest x-ray negative for any acute findings no evidence of pneumonia.  Since patient is having a productive cough consistent with bronchitis.  Will treat with Mucinex DM.  Suspect upper respiratory infection developing.  Again no signs of pneumonia.  Not concerned about pulmonary embolus.  Patient not hypoxic and symptoms  are more congestion and productive cough.  Not coughing up blood.   Final Clinical Impressions(s) / ED Diagnoses   Final diagnoses:  Bronchitis    ED Discharge Orders        Ordered    dextromethorphan-guaiFENesin [facility] DM) 30-600 MG 12hr tablet  2 times daily     11/07/17 2101       Fredia Sorrow, MD 11/07/17 2325

## 2017-11-08 ENCOUNTER — Ambulatory Visit: Payer: Medicare Other | Admitting: Adult Health

## 2017-11-08 ENCOUNTER — Encounter (HOSPITAL_COMMUNITY): Payer: Self-pay | Admitting: Orthopedic Surgery

## 2017-11-12 MED FILL — AMOXICILLIN 875 MG TABLET: 875 | 10 days supply | Qty: 20 | Fill #0

## 2017-12-04 ENCOUNTER — Encounter: Payer: Self-pay | Admitting: Neurology

## 2017-12-04 ENCOUNTER — Ambulatory Visit (INDEPENDENT_AMBULATORY_CARE_PROVIDER_SITE_OTHER): Payer: Medicare Other | Admitting: Neurology

## 2017-12-04 VITALS — BP 109/70 | HR 60 | Ht 73.0 in | Wt 225.0 lb

## 2017-12-04 DIAGNOSIS — Z9989 Dependence on other enabling machines and devices: Secondary | ICD-10-CM

## 2017-12-04 DIAGNOSIS — G4733 Obstructive sleep apnea (adult) (pediatric): Secondary | ICD-10-CM | POA: Diagnosis not present

## 2017-12-04 NOTE — Patient Instructions (Addendum)
Please continue using your CPAP regularly. While your insurance requires that you use CPAP at least 4 hours each night on 70% of the nights, I recommend, that you not skip any nights and use it throughout the night if you can. Getting used to CPAP and staying with the treatment long term does take time and patience and discipline. Untreated obstructive sleep apnea when it is moderate to severe can have an adverse impact on cardiovascular health and raise her risk for heart disease, arrhythmias, hypertension, congestive heart failure, stroke and diabetes. Untreated obstructive sleep apnea causes sleep disruption, nonrestorative sleep, and sleep deprivation. This can have an impact on your day to day functioning and cause daytime sleepiness and impairment of cognitive function, memory loss, mood disturbance, and problems focussing. Using CPAP regularly can improve these symptoms. You have done a great job using CPAP! Keep up the good work! We can see you in 1 year.

## 2017-12-04 NOTE — Progress Notes (Signed)
Subjective:    Patient ID: Jose [name]. is a 82 y.o. male.  HPI     Interim history:   Mr. Jose [name] is an 82 year old right-handed gentleman with an underlying medical history of new onset A. fib, obesity, reflux disease, recurrent TIA, hypertension, MGUS, ulnar neuropathy and arthritis, who presents for follow-up consultation of his OSA, treated with CPAP therapy. The patient is unaccompanied by his wife today. I last saw him on 12/04/2016, at which time he reported doing well with CPAP. He was supposed to have left total shoulder replacement in February 2018.  Today, 12/04/2017: I reviewed his CPAP compliance data from 11/03/2017 through 12/02/2017 which is a total of 30 days, during which time he used his CPAP every night with percent used days greater than 4 hours at 100%, indicating superb compliance with an average usage of 7 hours and 28 minutes, residual AHI at goal at 0.3 per hour, leak on the higher end with the 95th percentile at 17.8 L/m on a pressure of 12 cm with EPR of 3. Of note, he had interim left shoulder replacement surgery on 12/25/2016, then had lower back surgery in September 2018 and most recently right total shoulder replacement in January 2019. He was seen in the emergency room last month for bronchitis. He reports doing well, had right shoulder surgery last month, right shoulder in a sling for another 2 weeks longer. Left shoulder did rather well eventually, great range of motion, he feels much better after his low back surgery which was under Dr. Cyndy Freeze. He admits that he does not drink enough water. He drinks maybe one cup of water per day. He drinks coffee in the morning and may be a second smaller couple later on. He tries to avoid drinking water because his diuretic makes him go more to the bathroom. His wife reports that he does endorse feeling dizzy particularly in the morning. Thankfully, he has not fallen but he has had to sit up for at least a couple of minutes  before he stands up in the mornings. He has no issues using the CPAP.   The patient's allergies, current medications, family history, past medical history, past social history, past surgical history and problem list were reviewed and updated as appropriate.    Previously (copied from previous notes for reference):   I saw him on 12/05/2015, at which time we talked about his split-night sleep study results from 03/01/2015 and his compliance with CPAP. He was fully compliant with CPAP therapy. I suggested with a checkup. He saw Dr. Jannifer Franklin in the interim in March 2017. He had been treated in January 2017 for a community-acquired pneumonia. He then presented to the emergency room in January 2017 a balance issues and was diagnosed with vertigo, placed on meclizine as needed.    I reviewed his CPAP compliance data from 11/03/2016 through 12/02/2016, which is a total of 30 days, during which time he used his CPAP every night with percent used days greater than 4 hours at 100%, indicating superb compliance with an average usage of 7 hours and 33 minutes, residual AHI 0.3 per hour, leak low with the 95th percentile at 10.2 L/m on a pressure of 12 cm with EPR of 3.    I first met him on 06/01/2015 at the request of Dr. Jannifer Franklin, at which time we talked about his split-night sleep study results from 03/01/2015 as well as his compliance data. He reported doing well. He had adjusted well to treatment.  He felt improved with respect to sleep disruption, daytime somnolence, and sleep quality. He had recent ulnar nerve surgery about 3 weeks prior. He was taking gabapentin for neuropathy. He was seeing a cardiologist out of [place] and was on Eliquis. He goes to bed around 11 PM and his rise time is around 7 AM. He wakes up better rested. His neuropathy seems stable to him. In the interim, he had neck surgery on 02/04/2015 under Dr. Hal Neer. He feels improved. He has a follow-up with his hand surgeon next week. He has not  had any recent TIA type symptoms. He drinks alcohol occasionally. He quit smoking in the late 60s. He drinks quite a bit of coffee, at least 2 12 ounce cups a day.   I reviewed his CPAP compliance data from 11/01/2015 through 11/30/2015 which is a total of 30 days during which time he used his machine every night with percent used days greater than 4 hours at 100%, indicating superb compliance with an average usage of 7 hours and 29 minutes, residual AHI low at 0.4 per hour, leak borderline with the 95th percentile at 24.3 L/m on a pressure of 12 cm with EPR of 3.   06/01/2015: He was referred for a sleep study secondary to a report of witnessed apneas during his hospitalization in 1/16, and snoring reported. He had a split-night sleep study on 03/01/2015 and went over his test results with him in detail today. His baseline sleep efficiency was reduced at 77% with a latency to sleep of 5.5 minutes and wake after sleep onset of 29.5 minutes with moderate sleep fragmentation noted. He had a markedly elevated arousal index at 73.3 per hour, secondary to respiratory events. He had absence of slow-wave sleep and REM sleep prior to CPAP initiation. He had no significant PLMS, EKG or EEG changes. He had moderate snoring. He had a total AHI highly elevated at 71.3 per hour, average oxygen saturation was 93%, nadir was 83% during non-REM sleep. He was then placed on CPAP therapy. Sleep efficiency was 87.5% with a latency to sleep of 4 minutes and wake after sleep onset of 27 minutes with mild to moderate sleep fragmentation noted. He had a tremendous improvement in his arousal index to 3.6 per hour. He had 12.2% of slow-wave sleep and achieved 28.9% of REM sleep. Average oxygen saturation was 93%, nadir was 81%. He had no significant PLMS during the treatment portion of the study. CPAP was titrated from 5 cm to 13 cm with a reduction of his AHI to 2.8 per hour at a pressure of 12 cm. Based on his test results I  prescribed CPAP therapy for home use.    I reviewed his CPAP compliance data from 05/01/2015 through 05/30/2015 which is a total of 30 days during which time he used his machine every night with percent used days greater than 4 hours at 93%, indicating excellent compliance with an average usage of 6 hours and 32 minutes, residual AHI at 3.4 per hour, leak acceptable with the 95th percentile at 20.2 L/m on a pressure of 12 cm with EPR of 3.  His Past Medical History Is Significant For: Past Medical History:  Diagnosis Date  . Arthritis   . Dysrhythmia    afib  . Gallstones 07/1996  . History of CT scan of head 1997   small vessel disease  . History of ETT 05/2002    no EKG changes  . History of MRI of cervical spine 07/15/2002  .  Hypertension   . Lumbar spondylolysis 12/17/2016  . MGUS (monoclonal gammopathy of unknown significance) 04/11/2012  . Peripheral neuropathy   . Pneumonia 10/2014  . PONV (postoperative nausea and vomiting)    once many years ago  . Primary localized osteoarthrosis of left shoulder 12/25/2016  . Primary localized osteoarthrosis of right shoulder 11/05/2017  . Rectal bleeding 12/2002   colonoscopy  . Seizures ([place])    last seizure 1976  . Skin cancer   . Sleep apnea    uses CPAP  . Spinal stenosis, lumbar region with neurogenic claudication   . Stroke [facility])    ?tia ?afib  . TIA (transient ischemic attack)   . 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 extremity 03/29/2015    His Past Surgical History Is Significant For: Past Surgical History:  Procedure Laterality Date  . ANTERIOR CERVICAL DECOMP/DISCECTOMY FUSION N/A 02/04/2015   Procedure: ANTERIOR CERVICAL DECOMPRESSION/DISCECTOMY FUSION CERVICAL FIVE-SIX,CERVICAL SIX-SEVEN;  Surgeon: Karie Chimera, MD;  Location: [facility];  Service: Neurosurgery;  Laterality: N/A;  . BACK SURGERY    . CATARACT EXTRACTION Bilateral   . COLONOSCOPY W/ POLYPECTOMY  04/19/2003    tubular adenoma  . EYE SURGERY     Bilateral Cataracts  . GREAT TOE ARTHRODESIS, INTERPHALANGEAL JOINT Left 1978  . JOINT REPLACEMENT    . joint replacement on right thumb Right   . LUMBAR LAMINECTOMY/DECOMPRESSION MICRODISCECTOMY N/A 07/19/2017   Procedure: LAMINECTOMY AND FORAMINOTOMY  LUMBAR TWO- LUMBAR THREE, LUMBAR THREE- LUMBAR FOUR;  Surgeon: Ashok Pall, MD;  Location: [place];  Service: Neurosurgery;  Laterality: N/A;  . nerve transfer surgery to lwft hand Left    Nov. 2017  . SHOULDER ARTHROSCOPY W/ ROTATOR CUFF REPAIR Left 15   rotator cuff   . SHOULDER INJECTION  June 2012   left, [place]  . SKIN CANCER EXCISION  07/1993   left lateral arm  . SUPERFICIAL KERATECTOMY Right 05/1998   wrist  . thumb injection  June 2012   left, [place]  . TONSILLECTOMY AND ADENOIDECTOMY    . TOTAL SHOULDER ARTHROPLASTY Left 12/25/2016   Procedure: TOTAL SHOULDER ARTHROPLASTY;  Surgeon: Marchia Bond, MD;  Location: [facility];  Service: Orthopedics;  Laterality: Left;  . TOTAL SHOULDER ARTHROPLASTY Right 11/05/2017  . TOTAL SHOULDER ARTHROPLASTY Right 11/05/2017   Procedure: TOTAL SHOULDER ARTHROPLASTY;  Surgeon: Marchia Bond, MD;  Location: [place];  Service: Orthopedics;  Laterality: Right;  . ulnar nerve transfer  12/1999   left  . ULNAR NERVE TRANSPOSITION Left     His Family History Is Significant For: Family History  Problem Relation Age of Onset  . Dementia Mother 84       vascular  . Transient ischemic attack Mother   . Dementia Father        after hip fracture  . Hypertension Sister        skin cancer  . Seizures Neg Hx     His Social History Is Significant For: Social History   Socioeconomic History  . Marital status: Married    Spouse name: [name]  . Number of children: 2  . Years of education: 62  . Highest education level: None  Social Needs  . Financial resource strain: None  . Food insecurity - worry: None  . Food insecurity - inability: None  .  Transportation needs - medical: None  . Transportation needs - non-medical: None  Occupational History  . Occupation:  retired    Fish farm manager: UNEMPLOYED  Tobacco Use  . Smoking status: Former Smoker    Packs/day: 2.00    Years: 13.00    Pack years: 26.00    Types: Cigarettes    Last attempt to quit: 03/05/1968    Years since quitting: 49.7  . Smokeless tobacco: Never Used  Substance and Sexual Activity  . Alcohol use: Yes    Alcohol/week: 1.2 oz    Types: 2 Glasses of wine per week    Comment: 11/05/2017 "a beer and couple fingers of whiskey q now and then"  . Drug use: No  . Sexual activity: Not Currently  Other Topics Concern  . None  Social History Narrative   Lives with 4th wife, [name], married 2010   Retired, organizational psychology   [place], [place], [name], 2 children   Daughter, [place], in [place], 2 children   Patient is right handed   Patient drinks approximately 3-4 cups of caffeine daily    His Allergies Are:  Allergies  Allergen Reactions  . Maprotiline     TETRACYCLIC ANTIDEPRESSANTS UNSPECIFIED REACTION (patient is unaware of allergy)  . Tetracycline Hcl Itching and Rash    Rash on hands  :   His Current Medications Are:  Outpatient Encounter Medications as of 12/04/2017  Medication Sig  . amLODipine (NORVASC) 5 MG tablet Take 5 mg by mouth daily.  Marland Kitchen atorvastatin (LIPITOR) 10 MG tablet Take 10 mg by mouth daily.  . baclofen (LIORESAL) 10 MG tablet Take 1 tablet (10 mg total) by mouth 3 (three) times daily. As needed for muscle spasm  . dextromethorphan-guaiFENesin (MUCINEX DM) 30-600 MG 12hr tablet Take 1 tablet by mouth 2 (two) times daily.  Marland Kitchen ELIQUIS 5 MG TABS tablet TAKE 1 TABLET TWICE A DAY  . gabapentin (NEURONTIN) 300 MG capsule TAKE 1 CAPSULE TWICE DURING THE DAY AND TAKE 2 CAPSULES AT NIGHT (Patient taking differently: Take 300-600 mg by mouth 4 (four) times daily. TAKE 1 CAPSULE TWICE DURING THE DAY AND TAKE 2 CAPSULES AT  NIGHT)  . labetalol (NORMODYNE) 200 MG tablet Take 100 mg by mouth 2 (two) times daily.  Marland Kitchen latanoprost (XALATAN) 0.005 % ophthalmic solution Place 1 drop into the right eye at bedtime.  Marland Kitchen losartan-hydrochlorothiazide (HYZAAR) 50-12.5 MG per tablet Take 1 tablet by mouth 2 (two) times daily.  . meclizine (ANTIVERT) 25 MG tablet Take 1 tablet (25 mg total) by mouth 3 (three) times daily as needed.  . Multiple Vitamin (MULTIVITAMIN WITH MINERALS) TABS tablet Take 1 tablet by mouth daily. Centrum Silver  . Multiple Vitamins-Minerals (PRESERVISION AREDS 2) CAPS Take 1 capsule by mouth 2 (two) times daily.   . Omega-3 Fatty Acids (FISH OIL) 1000 MG CAPS Take 2,000 mg by mouth daily.   . ondansetron (ZOFRAN) 4 MG tablet Take 1 tablet (4 mg total) by mouth every 8 (eight) hours as needed for nausea or vomiting.  Marland Kitchen oxyCODONE-acetaminophen (PERCOCET) 10-325 MG tablet Take 1-2 tablets by mouth every 6 (six) hours as needed for pain. MAXIMUM TOTAL ACETAMINOPHEN DOSE IS 4000 MG PER DAY  . sennosides-docusate sodium (SENOKOT-S) 8.6-50 MG tablet Take 2 tablets by mouth daily.  . traMADol (ULTRAM) 50 MG tablet Take 1 tablet (50 mg total) by mouth every 6 (six) hours as needed.   No facility-administered encounter medications on file as of 12/04/2017.   :  Review of Systems:  Out of a complete 14 point review of systems, all are reviewed  and negative with the exception of these symptoms as listed below:  Review of Systems  Neurological:       Pt presents today to discuss his cpap. Pt reports that his cpap is going well.    Objective:  Neurological Exam  Physical Exam Physical Examination:   Vitals:   12/04/17 1031  BP: 109/70  Pulse: 60    General Examination: The patient is a very pleasant 82 y.o. male in no acute distress. He appears well-developed and well-nourished and well groomed.   HEENT: Normocephalic, atraumatic, pupils are equal, round and reactive to light and accommodation. Extraocular  tracking is good without limitation to gaze excursion or nystagmus noted. Normal smooth pursuit is noted. Hearing is grossly intact. s/p cataract surgeries. Face is symmetric with normal facial animation and normal facial sensation. Speech is clear with no dysarthria noted. There is no hypophonia. There is no lip, neck/head, jaw or voice tremor. Neck is supple with full range of passive and active motion. There are no carotid bruits on auscultation. Oropharynx exam reveals: moderate mouth dryness, adequate dental hygiene and moderate airway crowding. Mallampati is class II. Tongue protrudes centrally and palate elevates symmetrically. Tonsils are absent.   Chest: Clear to auscultation without wheezing, rhonchi or crackles noted.  Heart: S1+S2+0, regular and normal without murmurs, rubs or gallops noted.   Abdomen: Soft, non-tender and non-distended with normal bowel sounds appreciated on auscultation.  Extremities: There is trace pitting edema in the L ankle.    Skin: Warm and dry without trophic changes noted.  Musculoskeletal: exam reveals good range of motion in the left shoulder, right arm in a sling.   Neurologically:  Mental status: The patient is awake, alert and oriented in all 4 spheres. His immediate and remote memory, attention, language skills and fund of knowledge are appropriate. There is no evidence of aphasia, agnosia, apraxia or anomia. Speech is clear with normal prosody and enunciation. Thought process is linear. Mood is normal and affect is normal.  Cranial nerves II - XII are as described above under HEENT exam.  Motor exam: Normal bulk, strength and tone is noted. There is no drift, tremor or rebound. Romberg is not testable safely. Fine motor skills and coordination: grossly intact. Cerebellar testing: No dysmetria or intention tremor, but heel to shin is difficult on L.  Sensory exam: intact to light touch in the upper and lower extremities.  Gait, station and  balance: He stands with mild difficulty. Stance is wide based and posture is age-appropriate. Tandem walk is not testable safely.   Assessment and Plan:   In summary, Dacian Orrico. is a very pleasant 82 year old male with an underlying medical history of A fib, GERD, TIAs, HTN, MGUS, PN, and s/p ulnar nerve surgery, severe arthritis with upcoming shoulder surgery, who presents for follow-up consultation of his severe obstructive sleep apnea, well established on CPAP therapy with full compliance. He continues to do well in that regard. He is now status post left shoulder replacement last year, back surgery last year and more recently right shoulder replacement last month. He is coming along fine but is encouraged to drink more water as he has had dizzy spells, blood pressure is on the low normal side. He is encouraged to drink more water, be mindful about his fall risk. He is commended for his CPAP treatment adherence. He is advised to follow-up routinely in one year. I answered all their questions today and the patient and his wife were in  agreement.  I spent 30 minutes in total face-to-face time with the patient, more than 50% of which was spent in counseling and coordination of care, reviewing test results, reviewing medication and discussing or reviewing the diagnosis of OSA, its prognosis and treatment options. Pertinent laboratory and imaging test results that were available during this visit with the patient were reviewed by me and considered in my medical decision making (see chart for details).

## 2017-12-18 ENCOUNTER — Telehealth: Payer: Self-pay | Admitting: Neurology

## 2017-12-18 NOTE — Telephone Encounter (Signed)
Pt calling re: his gabapentin (NEURONTIN) 300 MG capsule, he states he has operating well now and no longer having nerve pain in his legs.  Pt would like to know if he can now stop his gabapentin (NEURONTIN) 300 MG capsule, please call

## 2017-12-18 NOTE — Telephone Encounter (Signed)
I called the patient.  The patient has done well following lumbosacral spine surgery, he has essentially no pain at this point, I agree that we should try to taper off of gabapentin if he does not need the medication.  With the patient will come down by 300 mg every 5 days until off the drug, if he has problems along the way he is to contact me.  He is currently taking 1 capsule twice daily and 2 at night.

## 2017-12-19 ENCOUNTER — Ambulatory Visit: Payer: Medicare Other | Attending: Orthopedic Surgery | Admitting: Physical Therapy

## 2017-12-19 ENCOUNTER — Other Ambulatory Visit: Payer: Self-pay

## 2017-12-19 DIAGNOSIS — M25511 Pain in right shoulder: Secondary | ICD-10-CM | POA: Diagnosis present

## 2017-12-19 DIAGNOSIS — M6281 Muscle weakness (generalized): Secondary | ICD-10-CM | POA: Diagnosis present

## 2017-12-19 DIAGNOSIS — R293 Abnormal posture: Secondary | ICD-10-CM

## 2017-12-19 DIAGNOSIS — M25611 Stiffness of right shoulder, not elsewhere classified: Secondary | ICD-10-CM | POA: Diagnosis present

## 2017-12-19 NOTE — Therapy (Signed)
[facility] [address]  [place], [place], [address] Phone: [phone]   Fax:  [phone]  Physical Therapy Evaluation  Patient Details  Name: Jose [name]. MRN: [id number] Date of Birth: 1936-06-08 Referring Provider: Marchia Bond, MD   Encounter Date: 12/19/2017  PT End of Session - 12/19/17 1017    Visit Number  1    Number of Visits  20    Date for PT Re-Evaluation  02/14/18    Authorization Type  Medicare & Tricare - PT only    PT Start Time  1017    PT Stop Time  1118    PT Time Calculation (min)  61 min    Activity Tolerance  Patient tolerated treatment well    Behavior During Therapy  WFL for tasks assessed/performed       Past Medical History:  Diagnosis Date  . Arthritis   . Dysrhythmia    afib  . Gallstones 07/1996  . History of CT scan of head 1997   small vessel disease  . History of ETT 05/2002    no EKG changes  . History of MRI of cervical spine 07/15/2002  . Hypertension   . Lumbar spondylolysis 12/17/2016  . MGUS (monoclonal gammopathy of unknown significance) 04/11/2012  . Peripheral neuropathy   . Pneumonia 10/2014  . PONV (postoperative nausea and vomiting)    once many years ago  . Primary localized osteoarthrosis of left shoulder 12/25/2016  . Primary localized osteoarthrosis of right shoulder 11/05/2017  . Rectal bleeding 12/2002   colonoscopy  . Seizures ([place])    last seizure 1976  . Skin cancer   . Sleep apnea    uses CPAP  . Spinal stenosis, lumbar region with neurogenic claudication   . Stroke [facility])    ?tia ?afib  . TIA (transient ischemic attack)   . 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 ANTERIOR CERVICAL DECOMP/DISCECTOMY FUSION N/A 02/04/2015   Procedure: ANTERIOR CERVICAL DECOMPRESSION/DISCECTOMY FUSION CERVICAL FIVE-SIX,CERVICAL  SIX-SEVEN;  Surgeon: Karie Chimera, MD;  Location: [place] [facility];  Service: Neurosurgery;  Laterality: N/A;  . BACK SURGERY    . CATARACT EXTRACTION Bilateral   . COLONOSCOPY W/ POLYPECTOMY  04/19/2003   tubular adenoma  . EYE SURGERY     Bilateral Cataracts  . GREAT TOE ARTHRODESIS, INTERPHALANGEAL JOINT Left 1978  . JOINT REPLACEMENT    . joint replacement on right thumb Right   . LUMBAR LAMINECTOMY/DECOMPRESSION MICRODISCECTOMY N/A 07/19/2017   Procedure: LAMINECTOMY AND FORAMINOTOMY  LUMBAR TWO- LUMBAR THREE, LUMBAR THREE- LUMBAR FOUR;  Surgeon: Ashok Pall, MD;  Location: [place];  Service: Neurosurgery;  Laterality: N/A;  . nerve transfer surgery to lwft hand Left    Nov. 2017  . SHOULDER ARTHROSCOPY W/ ROTATOR CUFF REPAIR Left 13   rotator cuff   . SHOULDER INJECTION  June 2012   left, [place]  . SKIN CANCER EXCISION  07/1993   left lateral arm  . SUPERFICIAL KERATECTOMY Right 05/1998   wrist  . thumb injection  June 2012   left, [place]  . TONSILLECTOMY AND ADENOIDECTOMY    . TOTAL SHOULDER ARTHROPLASTY Left 12/25/2016   Procedure: TOTAL SHOULDER ARTHROPLASTY;  Surgeon: Marchia Bond, MD;  Location: [place];  Service: Orthopedics;  Laterality: Left;  . TOTAL SHOULDER ARTHROPLASTY Right 11/05/2017  .  TOTAL SHOULDER ARTHROPLASTY Right 11/05/2017   Procedure: TOTAL SHOULDER ARTHROPLASTY;  Surgeon: Marchia Bond, MD;  Location: [place];  Service: Orthopedics;  Laterality: Right;  . ulnar nerve transfer  12/1999   left  . ULNAR NERVE TRANSPOSITION Left     There were no vitals filed for this visit.   Subjective Assessment - 12/19/17 1021    Subjective  Pt reporting uneventful post-op course following R TSA, noting actually less pain than after L TSA. Sling discontinued as of MD f/u yesterday.    Pertinent History  R TSA 11/05/17    Patient Stated Goals  "I want my R shoudler as good as my L"    Currently in Pain?  Yes    Pain Score  4  up to 8-9/10 at worst    Pain Location  Shoulder     Pain Orientation  Right    Pain Descriptors / Indicators  Aching;Sharp    Pain Type  Surgical pain;Acute pain    Pain Onset  More than a month ago    Pain Frequency  Constant    Aggravating Factors   attempting to raise arm    Pain Relieving Factors  careful with activity         [facility] PT Assessment - 12/19/17 1017      Assessment   Medical Diagnosis  R TSA    Referring Provider  Marchia Bond, MD    Onset Date/Surgical Date  11/05/17    Next MD Visit  01/17/18    Prior Therapy  PT after L TSA in 2018      Precautions   Precaution Comments  TSA protocol      Balance Screen   Has the patient fallen in the past 6 months  No    Has the patient had a decrease in activity level because of a fear of falling?   No    Is the patient reluctant to leave their home because of a fear of falling?   No      Home Environment   Living Environment  Private residence    Living Arrangements  Spouse/significant other    Type of Cook Access  Level entry    [place]  One level      Prior Function   Level of [place]  Retired    Leisure  traveling      Observation/Other Assessments   Focus on Therapeutic Outcomes (FOTO)   Shoulder - 53% (47% limitation); predicted 64% (36% limitation) in 17 visits      Posture/Postural Control   Posture/Postural Control  Postural limitations    Postural Limitations  Rounded Shoulders;Forward head      ROM / Strength   AROM / PROM / Strength  AROM;PROM;Strength      AROM   AROM Assessment Site  Shoulder    Right/Left Shoulder  Right;Left    Right Shoulder Flexion  68 Degrees    Right Shoulder ABduction  58 Degrees    Left Shoulder Flexion  132 Degrees    Left Shoulder ABduction  135 Degrees      PROM   Right/Left Shoulder  Right;Left    Right Shoulder Flexion  106 Degrees    Right Shoulder ABduction  75 Degrees    Right Shoulder Internal Rotation  50 Degrees at 30dg ABD    Right Shoulder External  Rotation  28 Degrees at 30dg ABD  Objective measurements completed on examination: See above findings.      [facility] Adult PT Treatment/Exercise - 12/19/17 1017      Exercises   Exercises  Shoulder      Modalities   Modalities  Vasopneumatic      Vasopneumatic   Number Minutes Vasopneumatic   15 minutes    Vasopnuematic Location   Shoulder    Vasopneumatic Pressure  Medium    Vasopneumatic Temperature   coldest temp      Manual Therapy   Manual Therapy  Passive ROM    Passive ROM  Gentle PROM in all planes: flexion and abduction to tolerance with hold at end range; IR and ER at 30 degrees abduction to tolerance with hold at end range.              PT Education - 12/19/17 1102    Education provided  Yes    Education Details  PT eval findings, anticipated POC & initial HEP    Person(s) Educated  Patient    Methods  Explanation;Demonstration;Handout    Comprehension  Verbalized understanding;Returned demonstration       PT Short Term Goals - 12/19/17 1118      PT SHORT TERM GOAL #1   Title  Patient to be independent with initial stretching/ROM HEP    Status  New    Target Date  01/16/18      PT SHORT TERM GOAL #2   Title  Patient to improve R shoulder PROM essentially equivalent or greater than L shoudler without pain limiting     Status  New    Target Date  01/16/18      PT SHORT TERM GOAL #3   Title  Patient to improve gross R shoulder strength to 3+/5    Status  New    Target Date  01/16/18        PT Long Term Goals - 12/19/17 1118      PT LONG TERM GOAL #1   Title  Patient to be independent with advanced HEP for R shoulder strengthening    Status  New    Target Date  02/14/18      PT LONG TERM GOAL #2   Title  Patient to improve R shoulder AROM essentially equivalent or greater than L shoudler without pain limiting     Status  New    Target Date  02/14/18      PT LONG TERM GOAL #3   Title  Patient to demonstrate ability to  perform ADLs and basic household chores without limitation due to R shoulder pain, LOM or weakness    Status  New    Target Date  02/14/18      PT LONG TERM GOAL #4   Title  Patient to improve R shoulder strength to >/= 4-/5 to 4/5 for improved functinal use or R UE    Status  New    Target Date  02/14/18             Plan - 12/19/17 1118    Clinical Impression Statement  Jose [name] is an 82 y/o male presenting to OP PT ~6 wks s/p R TSA on 11/05/17. Patient previously underwent L TSA in February 2018 with good post-op recovery of function. Patient reporting MD released him out of sling as of post-op follow earlier this week. Patient presents today with reduced PROM and AROM of R shoulder with general weakness and pain limiting motion. Education today on beginning pendulums  with further HEP to be established on next visit. Patient to benefit from PT to address ROM, strength, posture, as well as functional use of R UE.    History and Personal Factors relevant to plan of care:  L TSA - 2018, complex medical & surgical history as above    Clinical Presentation  Stable    Clinical Decision Making  Low    Rehab Potential  Good    PT Frequency  3x / week 2-3x/wk     PT Duration  8 weeks    PT Treatment/Interventions  Patient/family education;Neuromuscular re-education;ADLs/Self Care Home Management;Therapeutic exercise;Therapeutic activities;Manual techniques;Passive range of motion;Dry needling;Taping;Electrical Stimulation;Cryotherapy;Vasopneumatic Device;Moist Heat;Ultrasound;Iontophoresis 4mg /ml Dexamethasone    Consulted and Agree with Plan of Care  Patient       Patient will benefit from skilled therapeutic intervention in order to improve the following deficits and impairments:  Pain, Impaired UE functional use, Decreased strength, Decreased range of motion, Decreased activity tolerance, Decreased endurance  Visit Diagnosis: Acute pain of right shoulder  Stiffness of right shoulder, not  elsewhere classified  Muscle weakness (generalized)  Abnormal posture     Problem List Patient Active Problem List   Diagnosis Date Noted  . Primary localized osteoarthrosis of right shoulder 11/05/2017  . Primary localized osteoarthrosis of shoulder 11/05/2017  . Lumbar stenosis with neurogenic claudication 07/19/2017  . Primary localized osteoarthrosis of left shoulder 12/25/2016  . S/P shoulder replacement 12/25/2016  . Lumbar spondylolysis 12/17/2016  . Paresthesia 01/24/2016  . 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 extremity 03/29/2015  . Cervical spinal stenosis 02/04/2015  . Bronchiectasis without acute exacerbation ([place]) 01/11/2015  . Ulnar neuropathy at elbow 12/24/2014  . TIA (transient ischemic attack) 12/15/2014  . Hyperlipidemia 11/24/2014  . Hypoxia   . New onset a-fib ([place])   . MGUS (monoclonal gammopathy of unknown significance) 04/11/2012  . SEBORRHEIC KERATOSIS 11/09/2010  . CPK, ABNORMAL 09/03/2010  . DERMATOPHYTOSIS OF FOOT 08/03/2010  . MYALGIA 12/27/2009  . GOUT, UNSPECIFIED 10/04/2009  . HYPERTROPHY PROSTATE W/O UR OBST & OTH LUTS 12/14/2008  . ALLERGIC CONJUNCTIVITIS 12/03/2007  . TESTOSTERONE DEFICIENCY 11/14/2007  . Backache 04/22/2007  . HYPERLIPIDEMIA 12/26/2006  . OBESITY, NOS 12/26/2006  . 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 Hereditary and idiopathic peripheral neuropathy 12/26/2006  . HEARING LOSS NOS OR DEAFNESS 12/26/2006  . HYPERTENSION, BENIGN SYSTEMIC 12/26/2006  . OSTEOARTHRITIS, MULTI SITES 12/26/2006  . Musculoskeletal disorder and symptoms referable to neck 12/26/2006    Percival Spanish, PT, MPT 12/19/2017, 2:12 PM  [facility] [address]  [place], [place], [address] Phone: [phone]   Fax:  [phone]  Name: Jose [name]. MRN: [id number] Date of Birth: September 14, 1936

## 2017-12-20 MED FILL — LATANOPROST 0.005% EYE DRP: 0.005 | 50 days supply | Qty: 3 | Fill #0

## 2017-12-23 ENCOUNTER — Ambulatory Visit: Payer: Medicare Other | Admitting: Physical Therapy

## 2017-12-24 ENCOUNTER — Encounter: Payer: Self-pay | Admitting: Physical Therapy

## 2017-12-24 ENCOUNTER — Ambulatory Visit: Payer: Medicare Other | Admitting: Physical Therapy

## 2017-12-24 DIAGNOSIS — M25611 Stiffness of right shoulder, not elsewhere classified: Secondary | ICD-10-CM

## 2017-12-24 DIAGNOSIS — M25511 Pain in right shoulder: Secondary | ICD-10-CM | POA: Diagnosis not present

## 2017-12-24 DIAGNOSIS — M6281 Muscle weakness (generalized): Secondary | ICD-10-CM

## 2017-12-24 DIAGNOSIS — R293 Abnormal posture: Secondary | ICD-10-CM

## 2017-12-24 NOTE — Therapy (Signed)
[facility] [address]  [place], [place], [address] Phone: [phone]   Fax:  [phone]  Physical Therapy Treatment  Patient Details  Name: Jose [name]. MRN: [id number] Date of Birth: 05-Oct-1936 Referring Provider: Marchia Bond, MD   Encounter Date: 12/24/2017  PT End of Session - 12/24/17 1358    Visit Number  2    Number of Visits  20    Date for PT Re-Evaluation  02/14/18    Authorization Type  Medicare & Tricare - PT only    PT Start Time  4259    PT Stop Time  1459    PT Time Calculation (min)  61 min    Activity Tolerance  Patient tolerated treatment well    Behavior During Therapy  [facility] for tasks assessed/performed       Past Medical History:  Diagnosis Date  . Arthritis   . Dysrhythmia    afib  . Gallstones 07/1996  . History of CT scan of head 1997   small vessel disease  . History of ETT 05/2002    no EKG changes  . History of MRI of cervical spine 07/15/2002  . Hypertension   . Lumbar spondylolysis 12/17/2016  . MGUS (monoclonal gammopathy of unknown significance) 04/11/2012  . Peripheral neuropathy   . Pneumonia 10/2014  . PONV (postoperative nausea and vomiting)    once many years ago  . Primary localized osteoarthrosis of left shoulder 12/25/2016  . Primary localized osteoarthrosis of right shoulder 11/05/2017  . Rectal bleeding 12/2002   colonoscopy  . Seizures ([place])    last seizure 1976  . Skin cancer   . Sleep apnea    uses CPAP  . Spinal stenosis, lumbar region with neurogenic claudication   . Stroke [facility])    ?tia ?afib  . TIA (transient ischemic attack)   . 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 ANTERIOR CERVICAL DECOMP/DISCECTOMY FUSION N/A 02/04/2015   Procedure: ANTERIOR CERVICAL DECOMPRESSION/DISCECTOMY FUSION CERVICAL FIVE-SIX,CERVICAL  SIX-SEVEN;  Surgeon: Karie Chimera, MD;  Location: [place] [facility];  Service: Neurosurgery;  Laterality: N/A;  . BACK SURGERY    . CATARACT EXTRACTION Bilateral   . COLONOSCOPY W/ POLYPECTOMY  04/19/2003   tubular adenoma  . EYE SURGERY     Bilateral Cataracts  . GREAT TOE ARTHRODESIS, INTERPHALANGEAL JOINT Left 1978  . JOINT REPLACEMENT    . joint replacement on right thumb Right   . LUMBAR LAMINECTOMY/DECOMPRESSION MICRODISCECTOMY N/A 07/19/2017   Procedure: LAMINECTOMY AND FORAMINOTOMY  LUMBAR TWO- LUMBAR THREE, LUMBAR THREE- LUMBAR FOUR;  Surgeon: Ashok Pall, MD;  Location: [place];  Service: Neurosurgery;  Laterality: N/A;  . nerve transfer surgery to lwft hand Left    Nov. 2017  . SHOULDER ARTHROSCOPY W/ ROTATOR CUFF REPAIR Left 13   rotator cuff   . SHOULDER INJECTION  June 2012   left, [place]  . SKIN CANCER EXCISION  07/1993   left lateral arm  . SUPERFICIAL KERATECTOMY Right 05/1998   wrist  . thumb injection  June 2012   left, [place]  . TONSILLECTOMY AND ADENOIDECTOMY    . TOTAL SHOULDER ARTHROPLASTY Left 12/25/2016   Procedure: TOTAL SHOULDER ARTHROPLASTY;  Surgeon: Marchia Bond, MD;  Location: [place];  Service: Orthopedics;  Laterality: Left;  . TOTAL SHOULDER ARTHROPLASTY Right 11/05/2017  .  TOTAL SHOULDER ARTHROPLASTY Right 11/05/2017   Procedure: TOTAL SHOULDER ARTHROPLASTY;  Surgeon: Marchia Bond, MD;  Location: [place];  Service: Orthopedics;  Laterality: Right;  . ulnar nerve transfer  12/1999   left  . ULNAR NERVE TRANSPOSITION Left     There were no vitals filed for this visit.  Subjective Assessment - 12/24/17 1400    Subjective  Pt feeling like the R shoulder is progressing better than the L did following TSA. Noting increased soreness in anterior deltoid over the past few days up to 6-7/10, esp upon waking.    Pertinent History  R TSA 11/05/17    Patient Stated Goals  "I want my R shoudler as good as my L"    Currently in Pain?  Yes    Pain Score  3     Pain  Location  Shoulder    Pain Orientation  Right    Pain Descriptors / Indicators  Aching;Jose [name] Adult PT Treatment/Exercise - 12/24/17 1358      Exercises   Exercises  Shoulder      Shoulder Exercises: Supine   External Rotation  Left;AAROM;10 reps 2 sets    External Rotation Limitations  wand    Flexion  Right;AAROM;10 reps 2 sets    Flexion Limitations  wand      Shoulder Exercises: Standing   Internal Rotation  Right;AAROM;10 reps 2 sets    Internal Rotation Limitations  wand      Shoulder Exercises: Pulleys   Flexion  2 minutes    ABduction  2 minutes    ABduction Limitations  scaption      Shoulder Exercises: Isometric Strengthening   External Rotation  3X5"    Internal Rotation  5X5"    ABduction  5X5"      Manual Therapy   Manual Therapy  Soft tissue mobilization;Myofascial release;Passive ROM;Joint mobilization    Manual therapy comments  supine    Joint Mobilization  R shoulder grade II-III inf and A/P     Soft tissue mobilization  R ant deltoid, pecs & posterior capsule    Myofascial Release  TPR R ant deltoid    Passive ROM  Gentle PROM in all planes: flexion and abduction to tolerance with hold at end range; IR and ER at 30 degrees abduction to tolerance with hold at end range.              PT Education - 12/24/17 1445    Education provided  Yes    Education Details  HEP addition - wand & isometrics    Person(s) Educated  Patient    Methods  Explanation;Demonstration;Handout    Comprehension  Verbalized understanding;Returned demonstration       PT Short Term Goals - 12/24/17 1404      PT SHORT TERM GOAL #1   Title  Patient to be independent with initial stretching/ROM HEP    Status  On-going      PT SHORT TERM GOAL #2   Title  Patient to improve R shoulder PROM essentially equivalent or greater than L shoudler without pain limiting     Status  On-going      PT SHORT TERM GOAL #3   Title  Patient to  improve gross R shoulder strength to 3+/5    Status  On-going        PT Long Term Goals -  12/24/17 1405      PT LONG TERM GOAL #1   Title  Patient to be independent with advanced HEP for R shoulder strengthening    Status  On-going      PT LONG TERM GOAL #2   Title  Patient to improve R shoulder AROM essentially equivalent or greater than L shoudler without pain limiting     Status  On-going      PT LONG TERM GOAL #3   Title  Patient to demonstrate ability to perform ADLs and basic household chores without limitation due to R shoulder pain, LOM or weakness    Status  On-going      PT LONG TERM GOAL #4   Title  Patient to improve R shoulder strength to >/= 4-/5 to 4/5 for improved functinal use or R UE    Status  On-going            Plan - 12/24/17 1405    Clinical Impression Statement  Pt noting increased anterior deltoid/shoulder pain over past few days with increased muscle tension and TPs noted in anterior deltoid, proximal biceps & lateral pec major which improved with STM & MFR. Initiated AAROM and light isometric strengthening per TSA protocol today with good tolerance.    Rehab Potential  Good    PT Treatment/Interventions  Patient/family education;Neuromuscular re-education;ADLs/Self Care Home Management;Therapeutic exercise;Therapeutic activities;Manual techniques;Passive range of motion;Dry needling;Taping;Electrical Stimulation;Cryotherapy;Vasopneumatic Device;Moist Heat;Ultrasound;Iontophoresis 4mg /ml Dexamethasone    Consulted and Agree with Plan of Care  Patient       Patient will benefit from skilled therapeutic intervention in order to improve the following deficits and impairments:  Pain, Impaired UE functional use, Decreased strength, Decreased range of motion, Decreased activity tolerance, Decreased endurance  Visit Diagnosis: Acute pain of right shoulder  Stiffness of right shoulder, not elsewhere classified  Muscle weakness (generalized)  Abnormal  posture     Problem List Patient Active Problem List   Diagnosis Date Noted  . Primary localized osteoarthrosis of right shoulder 11/05/2017  . Primary localized osteoarthrosis of shoulder 11/05/2017  . Lumbar stenosis with neurogenic claudication 07/19/2017  . Primary localized osteoarthrosis of left shoulder 12/25/2016  . S/P shoulder replacement 12/25/2016  . Lumbar spondylolysis 12/17/2016  . Paresthesia 01/24/2016  . 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 extremity 03/29/2015  . Cervical spinal stenosis 02/04/2015  . Bronchiectasis without acute exacerbation ([place]) 01/11/2015  . Ulnar neuropathy at elbow 12/24/2014  . TIA (transient ischemic attack) 12/15/2014  . Hyperlipidemia 11/24/2014  . Hypoxia   . New onset a-fib ([place])   . MGUS (monoclonal gammopathy of unknown significance) 04/11/2012  . SEBORRHEIC KERATOSIS 11/09/2010  . CPK, ABNORMAL 09/03/2010  . DERMATOPHYTOSIS OF FOOT 08/03/2010  . MYALGIA 12/27/2009  . GOUT, UNSPECIFIED 10/04/2009  . HYPERTROPHY PROSTATE W/O UR OBST & OTH LUTS 12/14/2008  . ALLERGIC CONJUNCTIVITIS 12/03/2007  . TESTOSTERONE DEFICIENCY 11/14/2007  . Backache 04/22/2007  . HYPERLIPIDEMIA 12/26/2006  . OBESITY, NOS 12/26/2006  . 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 Hereditary and idiopathic peripheral neuropathy 12/26/2006  . HEARING LOSS NOS OR DEAFNESS 12/26/2006  . HYPERTENSION, BENIGN SYSTEMIC 12/26/2006  . OSTEOARTHRITIS, MULTI SITES 12/26/2006  . Musculoskeletal disorder and symptoms referable to neck 12/26/2006    Percival Spanish, PT, MPT 12/24/2017, 4:43 PM  [facility] [address]  [place], [place], [address] Phone: [phone]   Fax:  [phone]  Name: Jose [name]. MRN: [id number] Date of Birth: [birth year]

## 2017-12-26 ENCOUNTER — Encounter: Payer: Self-pay | Admitting: Physical Therapy

## 2017-12-26 ENCOUNTER — Ambulatory Visit: Payer: Medicare Other | Admitting: Physical Therapy

## 2017-12-26 DIAGNOSIS — M25511 Pain in right shoulder: Secondary | ICD-10-CM

## 2017-12-26 DIAGNOSIS — R293 Abnormal posture: Secondary | ICD-10-CM

## 2017-12-26 DIAGNOSIS — M6281 Muscle weakness (generalized): Secondary | ICD-10-CM

## 2017-12-26 DIAGNOSIS — M25611 Stiffness of right shoulder, not elsewhere classified: Secondary | ICD-10-CM

## 2017-12-26 NOTE — Therapy (Signed)
[facility] [address]  [place], [place], [address] Phone: [phone]   Fax:  [phone]  Physical Therapy Treatment  Patient Details  Name: Jose [name]. MRN: [id number] Date of Birth: 10-17-1936 Referring Provider: Marchia Bond, MD   Encounter Date: 12/26/2017  PT End of Session - 12/26/17 1314    Visit Number  3    Number of Visits  20    Date for PT Re-Evaluation  02/14/18    Authorization Type  Medicare & Tricare - PT only    PT Start Time  1311    PT Stop Time  1411    PT Time Calculation (min)  60 min    Activity Tolerance  Patient tolerated treatment well    Behavior During Therapy  [facility] for tasks assessed/performed       Past Medical History:  Diagnosis Date  . Arthritis   . Dysrhythmia    afib  . Gallstones 07/1996  . History of CT scan of head 1997   small vessel disease  . History of ETT 05/2002    no EKG changes  . History of MRI of cervical spine 07/15/2002  . Hypertension   . Lumbar spondylolysis 12/17/2016  . MGUS (monoclonal gammopathy of unknown significance) 04/11/2012  . Peripheral neuropathy   . Pneumonia 10/2014  . PONV (postoperative nausea and vomiting)    once many years ago  . Primary localized osteoarthrosis of left shoulder 12/25/2016  . Primary localized osteoarthrosis of right shoulder 11/05/2017  . Rectal bleeding 12/2002   colonoscopy  . Seizures ([place])    last seizure 1976  . Skin cancer   . Sleep apnea    uses CPAP  . Spinal stenosis, lumbar region with neurogenic claudication   . Stroke [facility])    ?tia ?afib  . TIA (transient ischemic attack)   . 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 ANTERIOR CERVICAL DECOMP/DISCECTOMY FUSION N/A 02/04/2015   Procedure: ANTERIOR CERVICAL DECOMPRESSION/DISCECTOMY FUSION CERVICAL FIVE-SIX,CERVICAL  SIX-SEVEN;  Surgeon: Karie Chimera, MD;  Location: [place] [facility];  Service: Neurosurgery;  Laterality: N/A;  . BACK SURGERY    . CATARACT EXTRACTION Bilateral   . COLONOSCOPY W/ POLYPECTOMY  04/19/2003   tubular adenoma  . EYE SURGERY     Bilateral Cataracts  . GREAT TOE ARTHRODESIS, INTERPHALANGEAL JOINT Left 1978  . JOINT REPLACEMENT    . joint replacement on right thumb Right   . LUMBAR LAMINECTOMY/DECOMPRESSION MICRODISCECTOMY N/A 07/19/2017   Procedure: LAMINECTOMY AND FORAMINOTOMY  LUMBAR TWO- LUMBAR THREE, LUMBAR THREE- LUMBAR FOUR;  Surgeon: Ashok Pall, MD;  Location: [place];  Service: Neurosurgery;  Laterality: N/A;  . nerve transfer surgery to lwft hand Left    Nov. 2017  . SHOULDER ARTHROSCOPY W/ ROTATOR CUFF REPAIR Left 13   rotator cuff   . SHOULDER INJECTION  June 2012   left, [place]  . SKIN CANCER EXCISION  07/1993   left lateral arm  . SUPERFICIAL KERATECTOMY Right 05/1998   wrist  . thumb injection  June 2012   left, [place]  . TONSILLECTOMY AND ADENOIDECTOMY    . TOTAL SHOULDER ARTHROPLASTY Left 12/25/2016   Procedure: TOTAL SHOULDER ARTHROPLASTY;  Surgeon: Marchia Bond, MD;  Location: [place];  Service: Orthopedics;  Laterality: Left;  . TOTAL SHOULDER ARTHROPLASTY Right 11/05/2017  .  TOTAL SHOULDER ARTHROPLASTY Right 11/05/2017   Procedure: TOTAL SHOULDER ARTHROPLASTY;  Surgeon: Marchia Bond, MD;  Location: [place];  Service: Orthopedics;  Laterality: Right;  . ulnar nerve transfer  12/1999   left  . ULNAR NERVE TRANSPOSITION Left     There were no vitals filed for this visit.  Subjective Assessment - 12/26/17 1312    Subjective  had a good bit of pain with AAROM flexion this morning with increased pain    Pertinent History  R TSA 11/05/17    Patient Stated Goals  "I want my R shoudler as good as my L"    Currently in Pain?  Yes    Pain Score  2     Pain Location  Shoulder    Pain Orientation  Right    Pain Descriptors / Indicators  Aching    Pain Type  Acute  pain;Surgical pain                      [facility] Adult PT Treatment/Exercise - 12/26/17 1316      Shoulder Exercises: Supine   External Rotation  AAROM;Right;10 reps    External Rotation Limitations  wand    Flexion  AAROM;Right;10 reps    Flexion Limitations  wand      Shoulder Exercises: Seated   Abduction  AAROM;Right;10 reps wand      Shoulder Exercises: Pulleys   Flexion  3 minutes    ABduction  3 minutes    ABduction Limitations  scaption      Shoulder Exercises: Isometric Strengthening   Flexion  5X5"    Extension  5X5"    External Rotation  5X5"    Internal Rotation  5X5"    ABduction  5X5"      Modalities   Modalities  Vasopneumatic      Vasopneumatic   Number Minutes Vasopneumatic   15 minutes    Vasopnuematic Location   Shoulder    Vasopneumatic Pressure  Medium    Vasopneumatic Temperature   coldest temp      Manual Therapy   Manual Therapy  Passive ROM    Manual therapy comments  supine    Passive ROM  PROM L GH joint all planes - IR/ER at 30 degrees abd.                PT Short Term Goals - 12/24/17 1404      PT SHORT TERM GOAL #1   Title  Patient to be independent with initial stretching/ROM HEP    Status  On-going      PT SHORT TERM GOAL #2   Title  Patient to improve R shoulder PROM essentially equivalent or greater than L shoudler without pain limiting     Status  On-going      PT SHORT TERM GOAL #3   Title  Patient to improve gross R shoulder strength to 3+/5    Status  On-going        PT Long Term Goals - 12/24/17 1405      PT LONG TERM GOAL #1   Title  Patient to be independent with advanced HEP for R shoulder strengthening    Status  On-going      PT LONG TERM GOAL #2   Title  Patient to improve R shoulder AROM essentially equivalent or greater than L shoudler without pain limiting     Status  On-going      PT LONG TERM GOAL #3  Title  Patient to demonstrate ability to perform ADLs and basic household  chores without limitation due to R shoulder pain, LOM or weakness    Status  On-going      PT LONG TERM GOAL #4   Title  Patient to improve R shoulder strength to >/= 4-/5 to 4/5 for improved functinal use or R UE    Status  On-going            Plan - 12/26/17 1315    Clinical Impression Statement  Mr. Keep reporting pain onset with AAROM flexion at home this morning with no known reason - pain has now subsided. Tolerable to all PROM, AAROM and isometrics with added flexion/extension with no issue. Will continue to prodress as TRA protocol allows.     PT Treatment/Interventions  Patient/family education;Neuromuscular re-education;ADLs/Self Care Home Management;Therapeutic exercise;Therapeutic activities;Manual techniques;Passive range of motion;Dry needling;Taping;Electrical Stimulation;Cryotherapy;Vasopneumatic Device;Moist Heat;Ultrasound;Iontophoresis 4mg /ml Dexamethasone    Consulted and Agree with Plan of Care  Patient       Patient will benefit from skilled therapeutic intervention in order to improve the following deficits and impairments:  Pain, Impaired UE functional use, Decreased strength, Decreased range of motion, Decreased activity tolerance, Decreased endurance  Visit Diagnosis: Acute pain of right shoulder  Stiffness of right shoulder, not elsewhere classified  Muscle weakness (generalized)  Abnormal posture     Problem List Patient Active Problem List   Diagnosis Date Noted  . Primary localized osteoarthrosis of right shoulder 11/05/2017  . Primary localized osteoarthrosis of shoulder 11/05/2017  . Lumbar stenosis with neurogenic claudication 07/19/2017  . Primary localized osteoarthrosis of left shoulder 12/25/2016  . S/P shoulder replacement 12/25/2016  . Lumbar spondylolysis 12/17/2016  . Paresthesia 01/24/2016  . 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 extremity 03/29/2015  . Cervical spinal stenosis 02/04/2015  . Bronchiectasis without acute  exacerbation ([place]) 01/11/2015  . Ulnar neuropathy at elbow 12/24/2014  . TIA (transient ischemic attack) 12/15/2014  . Hyperlipidemia 11/24/2014  . Hypoxia   . New onset a-fib ([place])   . MGUS (monoclonal gammopathy of unknown significance) 04/11/2012  . SEBORRHEIC KERATOSIS 11/09/2010  . CPK, ABNORMAL 09/03/2010  . DERMATOPHYTOSIS OF FOOT 08/03/2010  . MYALGIA 12/27/2009  . GOUT, UNSPECIFIED 10/04/2009  . HYPERTROPHY PROSTATE W/O UR OBST & OTH LUTS 12/14/2008  . ALLERGIC CONJUNCTIVITIS 12/03/2007  . TESTOSTERONE DEFICIENCY 11/14/2007  . Backache 04/22/2007  . HYPERLIPIDEMIA 12/26/2006  . OBESITY, NOS 12/26/2006  . 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 Hereditary and idiopathic peripheral neuropathy 12/26/2006  . HEARING LOSS NOS OR DEAFNESS 12/26/2006  . HYPERTENSION, BENIGN SYSTEMIC 12/26/2006  . OSTEOARTHRITIS, MULTI SITES 12/26/2006  . Musculoskeletal disorder and symptoms referable to neck 12/26/2006    Lanney Gins, PT, DPT 12/26/17 2:11 PM   [facility] [address]  [place], [place], [address] Phone: [phone]   Fax:  [phone]  Name: Dionisio Aragones. MRN: [id number] Date of Birth: May 11, 1936

## 2017-12-26 NOTE — Patient Instructions (Signed)
Flexion (Isometric)   Press right fist against wall. Hold __5__ seconds. Repeat __10__ times.   Extension (Isometric)   Place left bent elbow and back of arm against wall. Press elbow against wall. Hold __5__ seconds. Repeat __10__ times.   Cane Exercise: Abduction   Hold cane with right hand over end, palm-up, with other hand palm-down. Move arm out from side and up by pushing with other arm. Hold __5__ seconds. Repeat __10__ times.

## 2017-12-27 IMAGING — DX DG SHOULDER 1V*L*
1 series · 1 of 1 positions shown · non-contrast
Comparison: None.

CLINICAL DATA: Status post left shoulder replacement

EXAM:
LEFT SHOULDER - 1 VIEW

[shoulder ap]
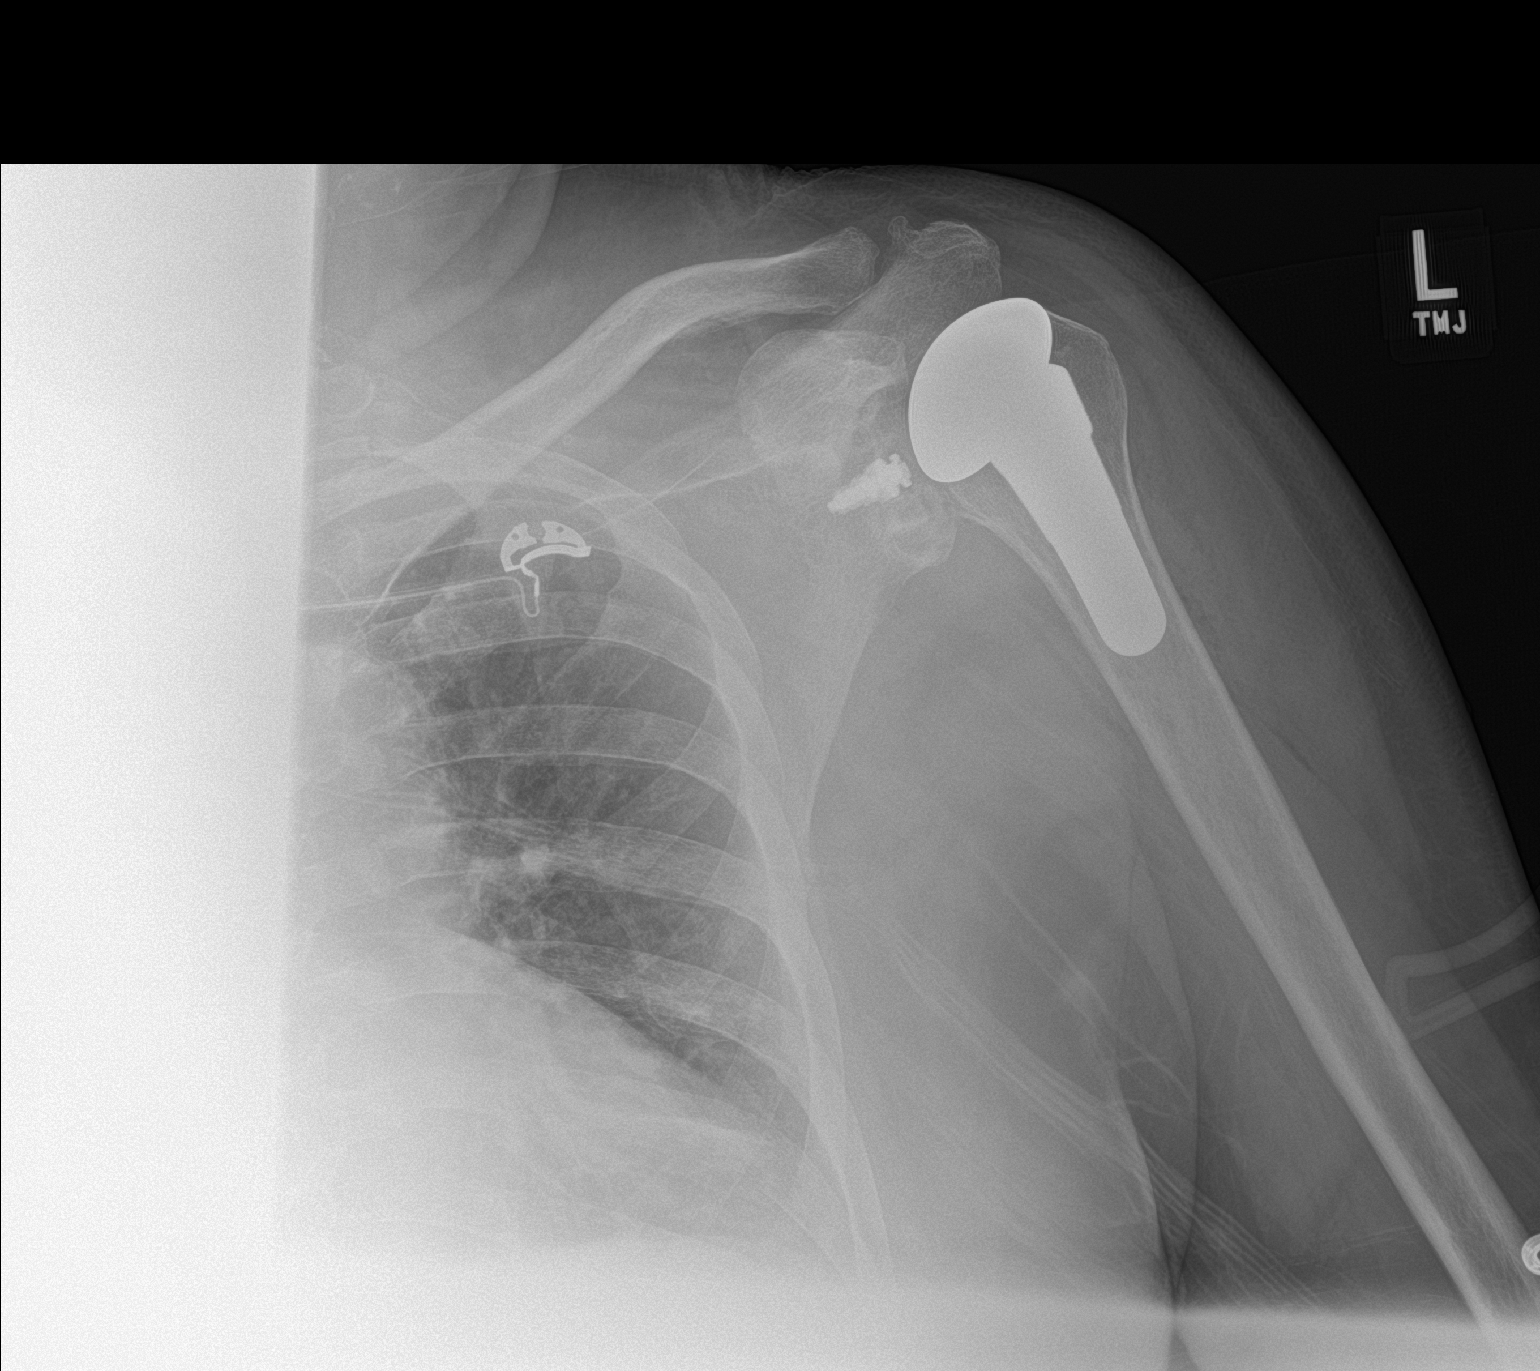

[1 of 1 positions shown; findings below may reference images not displayed]

FINDINGS: Left shoulder prosthesis is now seen. Degenerative changes of the
acromioclavicular joint are noted. No soft tissue or acute bony
abnormality is noted.
IMPRESSION: Status post left shoulder replacement

## 2017-12-30 ENCOUNTER — Ambulatory Visit: Payer: Medicare Other | Attending: Orthopedic Surgery | Admitting: Physical Therapy

## 2017-12-30 ENCOUNTER — Encounter: Payer: Self-pay | Admitting: Physical Therapy

## 2017-12-30 DIAGNOSIS — M25511 Pain in right shoulder: Secondary | ICD-10-CM | POA: Insufficient documentation

## 2017-12-30 DIAGNOSIS — M6281 Muscle weakness (generalized): Secondary | ICD-10-CM | POA: Diagnosis present

## 2017-12-30 DIAGNOSIS — M25611 Stiffness of right shoulder, not elsewhere classified: Secondary | ICD-10-CM | POA: Insufficient documentation

## 2017-12-30 DIAGNOSIS — R293 Abnormal posture: Secondary | ICD-10-CM | POA: Diagnosis present

## 2017-12-30 NOTE — Therapy (Signed)
[facility] [address]  [place], [place], [address] Phone: [phone]   Fax:  [phone]  Physical Therapy Treatment  Patient Details  Name: Jose [name]. MRN: [id number] Date of Birth: January 27, 1936 Referring Provider: Marchia Bond, MD   Encounter Date: 12/30/2017  PT End of Session - 12/30/17 1015    Visit Number  4    Number of Visits  20    Date for PT Re-Evaluation  02/14/18    Authorization Type  Medicare & Tricare - PT only    PT Start Time  1015    PT Stop Time  1121    PT Time Calculation (min)  66 min    Activity Tolerance  Patient tolerated treatment well    Behavior During Therapy  [facility] for tasks assessed/performed       Past Medical History:  Diagnosis Date  . Arthritis   . Dysrhythmia    afib  . Gallstones 07/1996  . History of CT scan of head 1997   small vessel disease  . History of ETT 05/2002    no EKG changes  . History of MRI of cervical spine 07/15/2002  . Hypertension   . Lumbar spondylolysis 12/17/2016  . MGUS (monoclonal gammopathy of unknown significance) 04/11/2012  . Peripheral neuropathy   . Pneumonia 10/2014  . PONV (postoperative nausea and vomiting)    once many years ago  . Primary localized osteoarthrosis of left shoulder 12/25/2016  . Primary localized osteoarthrosis of right shoulder 11/05/2017  . Rectal bleeding 12/2002   colonoscopy  . Seizures ([place])    last seizure 1976  . Skin cancer   . Sleep apnea    uses CPAP  . Spinal stenosis, lumbar region with neurogenic claudication   . Stroke [facility])    ?tia ?afib  . TIA (transient ischemic attack)   . 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 ANTERIOR CERVICAL DECOMP/DISCECTOMY FUSION N/A 02/04/2015   Procedure: ANTERIOR CERVICAL DECOMPRESSION/DISCECTOMY FUSION CERVICAL FIVE-SIX,CERVICAL  SIX-SEVEN;  Surgeon: Karie Chimera, MD;  Location: [place] [facility];  Service: Neurosurgery;  Laterality: N/A;  . BACK SURGERY    . CATARACT EXTRACTION Bilateral   . COLONOSCOPY W/ POLYPECTOMY  04/19/2003   tubular adenoma  . EYE SURGERY     Bilateral Cataracts  . GREAT TOE ARTHRODESIS, INTERPHALANGEAL JOINT Left 1978  . JOINT REPLACEMENT    . joint replacement on right thumb Right   . LUMBAR LAMINECTOMY/DECOMPRESSION MICRODISCECTOMY N/A 07/19/2017   Procedure: LAMINECTOMY AND FORAMINOTOMY  LUMBAR TWO- LUMBAR THREE, LUMBAR THREE- LUMBAR FOUR;  Surgeon: Ashok Pall, MD;  Location: [place];  Service: Neurosurgery;  Laterality: N/A;  . nerve transfer surgery to lwft hand Left    Nov. 2017  . SHOULDER ARTHROSCOPY W/ ROTATOR CUFF REPAIR Left 13   rotator cuff   . SHOULDER INJECTION  June 2012   left, [place]  . SKIN CANCER EXCISION  07/1993   left lateral arm  . SUPERFICIAL KERATECTOMY Right 05/1998   wrist  . thumb injection  June 2012   left, [place]  . TONSILLECTOMY AND ADENOIDECTOMY    . TOTAL SHOULDER ARTHROPLASTY Left 12/25/2016   Procedure: TOTAL SHOULDER ARTHROPLASTY;  Surgeon: Marchia Bond, MD;  Location: [place];  Service: Orthopedics;  Laterality: Left;  . TOTAL SHOULDER ARTHROPLASTY Right 11/05/2017  .  TOTAL SHOULDER ARTHROPLASTY Right 11/05/2017   Procedure: TOTAL SHOULDER ARTHROPLASTY;  Surgeon: Marchia Bond, MD;  Location: [place];  Service: Orthopedics;  Laterality: Right;  . ulnar nerve transfer  12/1999   left  . ULNAR NERVE TRANSPOSITION Left     There were no vitals filed for this visit.  Subjective Assessment - 12/30/17 1037    Subjective  Pt reporting anterior shoulder pain in deltoid region with AAROM shoulder flexion, with pain lingering even after exercises.    Pertinent History  R TSA 11/05/17    Patient Stated Goals  "I want my R shoudler as good as my L"    Currently in Pain?  Yes    Pain Score  6     Pain Location  Shoulder    Pain Orientation  Right    Pain  Descriptors / Indicators  Stabbing    Pain Type  Acute pain;Surgical pain    Pain Frequency  Constant                      [facility] Adult PT Treatment/Exercise - 12/30/17 1015      Exercises   Exercises  Shoulder      Shoulder Exercises: Supine   External Rotation  Right;AAROM;15 reps    External Rotation Limitations  wand    Flexion  Right;AAROM;15 reps    Flexion Limitations  wand      Shoulder Exercises: Pulleys   Flexion  3 minutes    ABduction  3 minutes    ABduction Limitations  scaption      Modalities   Modalities  Vasopneumatic      Vasopneumatic   Number Minutes Vasopneumatic   15 minutes    Vasopnuematic Location   Shoulder    Vasopneumatic Pressure  Medium    Vasopneumatic Temperature   coldest temp      Manual Therapy   Manual Therapy  Soft tissue mobilization;Myofascial release;Passive ROM;Taping    Soft tissue mobilization  R ant/middle deltoid & posterior capsule    Myofascial Release  TPR R ant deltoid    Passive ROM  PROM L GH joint all planes - IR/ER at 30 degrees abd.     Kinesiotex  Inhibit Muscle      Kinesiotix   Inhibit Muscle   R deltoid               PT Short Term Goals - 12/24/17 1404      PT SHORT TERM GOAL #1   Title  Patient to be independent with initial stretching/ROM HEP    Status  On-going      PT SHORT TERM GOAL #2   Title  Patient to improve R shoulder PROM essentially equivalent or greater than L shoudler without pain limiting     Status  On-going      PT SHORT TERM GOAL #3   Title  Patient to improve gross R shoulder strength to 3+/5    Status  On-going        PT Long Term Goals - 12/24/17 1405      PT LONG TERM GOAL #1   Title  Patient to be independent with advanced HEP for R shoulder strengthening    Status  On-going      PT LONG TERM GOAL #2   Title  Patient to improve R shoulder AROM essentially equivalent or greater than L shoudler without pain limiting     Status  On-going  PT  LONG TERM GOAL #3   Title  Patient to demonstrate ability to perform ADLs and basic household chores without limitation due to R shoulder pain, LOM or weakness    Status  On-going      PT LONG TERM GOAL #4   Title  Patient to improve R shoulder strength to >/= 4-/5 to 4/5 for improved functinal use or R UE    Status  On-going            Plan - 12/30/17 1040    Clinical Impression Statement  Pt reporting greatest limitation due to pain in R anterior shoulder (deltoid) limiting lower range of AAROM R shoulder, and lingering after exercise completion. Continued taut bands/TPs noted in R anterior/middle/posterior deltoids which responded postively to manual therapy with pt able to perform R shoulder AAROM flexion with wand w/o pain following manual therapy. Trial of kinesiotaping to inhibit deltoid applied at end of session and will assess response on next visit.     Rehab Potential  Good    PT Treatment/Interventions  Patient/family education;Neuromuscular re-education;ADLs/Self Care Home Management;Therapeutic exercise;Therapeutic activities;Manual techniques;Passive range of motion;Dry needling;Taping;Electrical Stimulation;Cryotherapy;Vasopneumatic Device;Moist Heat;Ultrasound;Iontophoresis 4mg /ml Dexamethasone    Consulted and Agree with Plan of Care  Patient       Patient will benefit from skilled therapeutic intervention in order to improve the following deficits and impairments:  Pain, Impaired UE functional use, Decreased strength, Decreased range of motion, Decreased activity tolerance, Decreased endurance  Visit Diagnosis: Acute pain of right shoulder  Stiffness of right shoulder, not elsewhere classified  Muscle weakness (generalized)  Abnormal posture     Problem List Patient Active Problem List   Diagnosis Date Noted  . Primary localized osteoarthrosis of right shoulder 11/05/2017  . Primary localized osteoarthrosis of shoulder 11/05/2017  . Lumbar stenosis with  neurogenic claudication 07/19/2017  . Primary localized osteoarthrosis of left shoulder 12/25/2016  . S/P shoulder replacement 12/25/2016  . Lumbar spondylolysis 12/17/2016  . Paresthesia 01/24/2016  . 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 extremity 03/29/2015  . Cervical spinal stenosis 02/04/2015  . Bronchiectasis without acute exacerbation ([place]) 01/11/2015  . Ulnar neuropathy at elbow 12/24/2014  . TIA (transient ischemic attack) 12/15/2014  . Hyperlipidemia 11/24/2014  . Hypoxia   . New onset a-fib ([place])   . MGUS (monoclonal gammopathy of unknown significance) 04/11/2012  . SEBORRHEIC KERATOSIS 11/09/2010  . CPK, ABNORMAL 09/03/2010  . DERMATOPHYTOSIS OF FOOT 08/03/2010  . MYALGIA 12/27/2009  . GOUT, UNSPECIFIED 10/04/2009  . HYPERTROPHY PROSTATE W/O UR OBST & OTH LUTS 12/14/2008  . ALLERGIC CONJUNCTIVITIS 12/03/2007  . TESTOSTERONE DEFICIENCY 11/14/2007  . Backache 04/22/2007  . HYPERLIPIDEMIA 12/26/2006  . OBESITY, NOS 12/26/2006  . 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 Hereditary and idiopathic peripheral neuropathy 12/26/2006  . HEARING LOSS NOS OR DEAFNESS 12/26/2006  . HYPERTENSION, BENIGN SYSTEMIC 12/26/2006  . OSTEOARTHRITIS, MULTI SITES 12/26/2006  . Musculoskeletal disorder and symptoms referable to neck 12/26/2006    Percival Spanish, PT, MPT 12/30/2017, 1:34 PM  [facility] [address]  [place], [place], [address] Phone: [phone]   Fax:  [phone]  Name: [place] Mullett. MRN: [id number] Date of Birth: [birth year]

## 2018-01-02 ENCOUNTER — Encounter: Payer: Self-pay | Admitting: Physical Therapy

## 2018-01-02 ENCOUNTER — Ambulatory Visit: Payer: Medicare Other | Admitting: Physical Therapy

## 2018-01-02 DIAGNOSIS — M25611 Stiffness of right shoulder, not elsewhere classified: Secondary | ICD-10-CM

## 2018-01-02 DIAGNOSIS — R293 Abnormal posture: Secondary | ICD-10-CM

## 2018-01-02 DIAGNOSIS — M6281 Muscle weakness (generalized): Secondary | ICD-10-CM

## 2018-01-02 DIAGNOSIS — M25511 Pain in right shoulder: Secondary | ICD-10-CM | POA: Diagnosis not present

## 2018-01-02 NOTE — Patient Instructions (Signed)
SHOULDER: Flexion - Supine   Raise Right arm overhead. __10-15_ reps per set, _2__ sets per day.  EXTERNAL ROTATION: Side-Lying (Active)   Lie on left side, top arm bent to 90, elbow against side, hand forward. Rotate forearm up as high as possible. Use _0__ lbs. Complete __2_ sets of _10-15__ repetitions.

## 2018-01-02 NOTE — Therapy (Signed)
[facility] [address]  [place], [place], [address] Phone: [phone]   Fax:  [phone]  Physical Therapy Treatment  Patient Details  Name: Jose [name]. MRN: [id number] Date of Birth: 01/08/1936 Referring Provider: Marchia Bond, MD   Encounter Date: 01/02/2018  PT End of Session - 01/02/18 1102    Visit Number  5    Number of Visits  20    Date for PT Re-Evaluation  02/14/18    Authorization Type  Medicare & Tricare - PT only    PT Start Time  1053    PT Stop Time  1154    PT Time Calculation (min)  61 min    Activity Tolerance  Patient tolerated treatment well    Behavior During Therapy  [facility] for tasks assessed/performed       Past Medical History:  Diagnosis Date  . Arthritis   . Dysrhythmia    afib  . Gallstones 07/1996  . History of CT scan of head 1997   small vessel disease  . History of ETT 05/2002    no EKG changes  . History of MRI of cervical spine 07/15/2002  . Hypertension   . Lumbar spondylolysis 12/17/2016  . MGUS (monoclonal gammopathy of unknown significance) 04/11/2012  . Peripheral neuropathy   . Pneumonia 10/2014  . PONV (postoperative nausea and vomiting)    once many years ago  . Primary localized osteoarthrosis of left shoulder 12/25/2016  . Primary localized osteoarthrosis of right shoulder 11/05/2017  . Rectal bleeding 12/2002   colonoscopy  . Seizures ([place])    last seizure 1976  . Skin cancer   . Sleep apnea    uses CPAP  . Spinal stenosis, lumbar region with neurogenic claudication   . Stroke [facility])    ?tia ?afib  . TIA (transient ischemic attack)   . 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 ANTERIOR CERVICAL DECOMP/DISCECTOMY FUSION N/A 02/04/2015   Procedure: ANTERIOR CERVICAL DECOMPRESSION/DISCECTOMY FUSION CERVICAL FIVE-SIX,CERVICAL  SIX-SEVEN;  Surgeon: Karie Chimera, MD;  Location: [facility];  Service: Neurosurgery;  Laterality: N/A;  . BACK SURGERY    . CATARACT EXTRACTION Bilateral   . COLONOSCOPY W/ POLYPECTOMY  04/19/2003   tubular adenoma  . EYE SURGERY     Bilateral Cataracts  . GREAT TOE ARTHRODESIS, INTERPHALANGEAL JOINT Left 1978  . JOINT REPLACEMENT    . joint replacement on right thumb Right   . LUMBAR LAMINECTOMY/DECOMPRESSION MICRODISCECTOMY N/A 07/19/2017   Procedure: LAMINECTOMY AND FORAMINOTOMY  LUMBAR TWO- LUMBAR THREE, LUMBAR THREE- LUMBAR FOUR;  Surgeon: Ashok Pall, MD;  Location: [facility];  Service: Neurosurgery;  Laterality: N/A;  . nerve transfer surgery to lwft hand Left    Nov. 2017  . SHOULDER ARTHROSCOPY W/ ROTATOR CUFF REPAIR Left 13   rotator cuff   . SHOULDER INJECTION  June 2012   left, [place]  . SKIN CANCER EXCISION  07/1993   left lateral arm  . SUPERFICIAL KERATECTOMY Right 05/1998   wrist  . thumb injection  June 2012   left, [place]  . TONSILLECTOMY AND ADENOIDECTOMY    . TOTAL SHOULDER ARTHROPLASTY Left 12/25/2016   Procedure: TOTAL SHOULDER ARTHROPLASTY;  Surgeon: Marchia Bond, MD;  Location: [facility];  Service: Orthopedics;  Laterality: Left;  . TOTAL SHOULDER ARTHROPLASTY Right 11/05/2017  .  TOTAL SHOULDER ARTHROPLASTY Right 11/05/2017   Procedure: TOTAL SHOULDER ARTHROPLASTY;  Surgeon: Marchia Bond, MD;  Location: [facility];  Service: Orthopedics;  Laterality: Right;  . ulnar nerve transfer  12/1999   left  . ULNAR NERVE TRANSPOSITION Left     There were no vitals filed for this visit.  Subjective Assessment - 01/02/18 1100    Subjective  relief during the day with taping at last session - however, had pain each morning that he woke up    Pertinent History  R TSA 11/05/17    Patient Stated Goals  "I want my R shoudler as good as my L"    Currently in Pain?  Yes    Pain Score  2     Pain Location  Shoulder    Pain Orientation  Right    Pain Descriptors / Indicators   Aching    Pain Type  Acute pain;Surgical pain                      [facility] Adult PT Treatment/Exercise - 01/02/18 1103      Shoulder Exercises: Supine   Flexion  AROM;Right;15 reps      Shoulder Exercises: Sidelying   External Rotation  AROM;Right;15 reps to neutral    Other Sidelying Exercises  horizontal abduction x 10 reps      Shoulder Exercises: Standing   External Rotation  Right;10 reps;Theraband    Theraband Level (Shoulder External Rotation)  Level 1 (Yellow)    External Rotation Limitations  isometric step outs     Internal Rotation  Right;10 reps    Theraband Level (Shoulder Internal Rotation)  Level 1 (Yellow)    Internal Rotation Limitations  isometric step outs    Flexion  AAROM;Right;10 reps wall ladder    ABduction  AAROM;Right;10 reps wall ladder - scaption      Shoulder Exercises: Pulleys   Flexion  3 minutes    ABduction  3 minutes    ABduction Limitations  scaption      Shoulder Exercises: ROM/Strengthening   Wall Wash  R soulder flexion x 12 reps      Modalities   Modalities  Vasopneumatic      Vasopneumatic   Number Minutes Vasopneumatic   15 minutes    Vasopnuematic Location   Shoulder    Vasopneumatic Pressure  Low    Vasopneumatic Temperature   coldest temp      Manual Therapy   Manual Therapy  Soft tissue mobilization;Myofascial release    Manual therapy comments  patient sidelying    Soft tissue mobilization  STM to R ant/med deltoid, R infraspinatus, R teres group    Myofascial Release  manual trigger point release to R infraspinatus               PT Short Term Goals - 12/24/17 1404      PT SHORT TERM GOAL #1   Title  Patient to be independent with initial stretching/ROM HEP    Status  On-going      PT SHORT TERM GOAL #2   Title  Patient to improve R shoulder PROM essentially equivalent or greater than L shoudler without pain limiting     Status  On-going      PT SHORT TERM GOAL #3   Title  Patient to improve  gross R shoulder strength to 3+/5    Status  On-going        PT Long Term Goals - 12/24/17 1660  PT LONG TERM GOAL #1   Title  Patient to be independent with advanced HEP for R shoulder strengthening    Status  On-going      PT LONG TERM GOAL #2   Title  Patient to improve R shoulder AROM essentially equivalent or greater than L shoudler without pain limiting     Status  On-going      PT LONG TERM GOAL #3   Title  Patient to demonstrate ability to perform ADLs and basic household chores without limitation due to R shoulder pain, LOM or weakness    Status  On-going      PT LONG TERM GOAL #4   Title  Patient to improve R shoulder strength to >/= 4-/5 to 4/5 for improved functinal use or R UE    Status  On-going            Plan - 01/02/18 1102    Clinical Impression Statement  Patient with subjective reports of releif in symptoms with taping, however, waking with increased pain with taping. Doing well with all ROM nad strengthening with ability to progress AROM flexion and ER in supine and sidelying. Unable to produce active abduction in sidelying, in part due to increased pain. Will plan to progress HEP to isometric theraband at next visit per patient tolerance.     PT Treatment/Interventions  Patient/family education;Neuromuscular re-education;ADLs/Self Care Home Management;Therapeutic exercise;Therapeutic activities;Manual techniques;Passive range of motion;Dry needling;Taping;Electrical Stimulation;Cryotherapy;Vasopneumatic Device;Moist Heat;Ultrasound;Iontophoresis 4mg /ml Dexamethasone    Consulted and Agree with Plan of Care  Patient       Patient will benefit from skilled therapeutic intervention in order to improve the following deficits and impairments:  Pain, Impaired UE functional use, Decreased strength, Decreased range of motion, Decreased activity tolerance, Decreased endurance  Visit Diagnosis: Acute pain of right shoulder  Stiffness of right shoulder, not  elsewhere classified  Muscle weakness (generalized)  Abnormal posture     Problem List Patient Active Problem List   Diagnosis Date Noted  . Primary localized osteoarthrosis of right shoulder 11/05/2017  . Primary localized osteoarthrosis of shoulder 11/05/2017  . Lumbar stenosis with neurogenic claudication 07/19/2017  . Primary localized osteoarthrosis of left shoulder 12/25/2016  . S/P shoulder replacement 12/25/2016  . Lumbar spondylolysis 12/17/2016  . Paresthesia 01/24/2016  . 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 extremity 03/29/2015  . Cervical spinal stenosis 02/04/2015  . Bronchiectasis without acute exacerbation ([place]) 01/11/2015  . Ulnar neuropathy at elbow 12/24/2014  . TIA (transient ischemic attack) 12/15/2014  . Hyperlipidemia 11/24/2014  . Hypoxia   . New onset a-fib ([place])   . MGUS (monoclonal gammopathy of unknown significance) 04/11/2012  . SEBORRHEIC KERATOSIS 11/09/2010  . CPK, ABNORMAL 09/03/2010  . DERMATOPHYTOSIS OF FOOT 08/03/2010  . MYALGIA 12/27/2009  . GOUT, UNSPECIFIED 10/04/2009  . HYPERTROPHY PROSTATE W/O UR OBST & OTH LUTS 12/14/2008  . ALLERGIC CONJUNCTIVITIS 12/03/2007  . TESTOSTERONE DEFICIENCY 11/14/2007  . Backache 04/22/2007  . HYPERLIPIDEMIA 12/26/2006  . OBESITY, NOS 12/26/2006  . 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 Hereditary and idiopathic peripheral neuropathy 12/26/2006  . HEARING LOSS NOS OR DEAFNESS 12/26/2006  . HYPERTENSION, BENIGN SYSTEMIC 12/26/2006  . OSTEOARTHRITIS, MULTI SITES 12/26/2006  . Musculoskeletal disorder and symptoms referable to neck 12/26/2006     Lanney Gins, PT, DPT 01/02/18 11:57 AM   [facility] [address]  [place], [place], [address] Phone: [phone]   Fax:  [phone]  Name: Jose RIETH  Brooke [name] MRN: [id number] Date of Birth: May 30, 1936

## 2018-01-06 ENCOUNTER — Ambulatory Visit: Payer: Medicare Other | Admitting: Physical Therapy

## 2018-01-06 ENCOUNTER — Encounter: Payer: Self-pay | Admitting: Physical Therapy

## 2018-01-06 DIAGNOSIS — M25611 Stiffness of right shoulder, not elsewhere classified: Secondary | ICD-10-CM

## 2018-01-06 DIAGNOSIS — M6281 Muscle weakness (generalized): Secondary | ICD-10-CM

## 2018-01-06 DIAGNOSIS — M25511 Pain in right shoulder: Secondary | ICD-10-CM

## 2018-01-06 DIAGNOSIS — R293 Abnormal posture: Secondary | ICD-10-CM

## 2018-01-06 NOTE — Therapy (Signed)
[facility] [address]  [place], [place], [address] Phone: [phone]   Fax:  [phone]  Physical Therapy Treatment  Patient Details  Name: Jose [name]. MRN: [id number] Date of Birth: 27-Oct-1936 Referring Provider: Marchia Bond, MD   Encounter Date: 01/06/2018  PT End of Session - 01/06/18 1016    Visit Number  6    Number of Visits  20    Date for PT Re-Evaluation  02/14/18    Authorization Type  Medicare & Tricare - PT only    PT Start Time  1016    PT Stop Time  1119    PT Time Calculation (min)  63 min    Activity Tolerance  Patient tolerated treatment well    Behavior During Therapy  [facility] for tasks assessed/performed       Past Medical History:  Diagnosis Date  . Arthritis   . Dysrhythmia    afib  . Gallstones 07/1996  . History of CT scan of head 1997   small vessel disease  . History of ETT 05/2002    no EKG changes  . History of MRI of cervical spine 07/15/2002  . Hypertension   . Lumbar spondylolysis 12/17/2016  . MGUS (monoclonal gammopathy of unknown significance) 04/11/2012  . Peripheral neuropathy   . Pneumonia 10/2014  . PONV (postoperative nausea and vomiting)    once many years ago  . Primary localized osteoarthrosis of left shoulder 12/25/2016  . Primary localized osteoarthrosis of right shoulder 11/05/2017  . Rectal bleeding 12/2002   colonoscopy  . Seizures ([place])    last seizure 1976  . Skin cancer   . Sleep apnea    uses CPAP  . Spinal stenosis, lumbar region with neurogenic claudication   . Stroke [facility])    ?tia ?afib  . TIA (transient ischemic attack)   . 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 ANTERIOR CERVICAL DECOMP/DISCECTOMY FUSION N/A 02/04/2015   Procedure: ANTERIOR CERVICAL DECOMPRESSION/DISCECTOMY FUSION CERVICAL FIVE-SIX,CERVICAL  SIX-SEVEN;  Surgeon: Karie Chimera, MD;  Location: [facility];  Service: Neurosurgery;  Laterality: N/A;  . BACK SURGERY    . CATARACT EXTRACTION Bilateral   . COLONOSCOPY W/ POLYPECTOMY  04/19/2003   tubular adenoma  . EYE SURGERY     Bilateral Cataracts  . GREAT TOE ARTHRODESIS, INTERPHALANGEAL JOINT Left 1978  . JOINT REPLACEMENT    . joint replacement on right thumb Right   . LUMBAR LAMINECTOMY/DECOMPRESSION MICRODISCECTOMY N/A 07/19/2017   Procedure: LAMINECTOMY AND FORAMINOTOMY  LUMBAR TWO- LUMBAR THREE, LUMBAR THREE- LUMBAR FOUR;  Surgeon: Ashok Pall, MD;  Location: [facility];  Service: Neurosurgery;  Laterality: N/A;  . nerve transfer surgery to lwft hand Left    Nov. 2017  . SHOULDER ARTHROSCOPY W/ ROTATOR CUFF REPAIR Left 13   rotator cuff   . SHOULDER INJECTION  June 2012   left, [place]  . SKIN CANCER EXCISION  07/1993   left lateral arm  . SUPERFICIAL KERATECTOMY Right 05/1998   wrist  . thumb injection  June 2012   left, [place]  . TONSILLECTOMY AND ADENOIDECTOMY    . TOTAL SHOULDER ARTHROPLASTY Left 12/25/2016   Procedure: TOTAL SHOULDER ARTHROPLASTY;  Surgeon: Marchia Bond, MD;  Location: [facility];  Service: Orthopedics;  Laterality: Left;  . TOTAL SHOULDER ARTHROPLASTY Right 11/05/2017  .  TOTAL SHOULDER ARTHROPLASTY Right 11/05/2017   Procedure: TOTAL SHOULDER ARTHROPLASTY;  Surgeon: Marchia Bond, MD;  Location: [facility];  Service: Orthopedics;  Laterality: Right;  . ulnar nerve transfer  12/1999   left  . ULNAR NERVE TRANSPOSITION Left     There were no vitals filed for this visit.  Subjective Assessment - 01/06/18 1020    Subjective  Pt reporting he woke up with same throbbing pain he had experienced last week with the taping (no tape this time), so bad that he had to take a tramadol. Did drive to [employer] and back over the weekend, but did not note any issues with this.    Pertinent History  R TSA 11/05/17    Patient Stated Goals  "I want my R shoudler as good as my  L"    Currently in Pain?  Yes    Pain Score  3     Pain Location  Shoulder    Pain Orientation  Right    Pain Descriptors / Indicators  Aching    Pain Frequency  Constant    Aggravating Factors   random upon waking from sleeping    Pain Relieving Factors  tramadol when pain extreme                      [facility] Adult PT Treatment/Exercise - 01/06/18 1016      Exercises   Exercises  Shoulder      Shoulder Exercises: Supine   Protraction  Right;10 reps;Weights;Strengthening    Protraction Weight (lbs)  2    Protraction Limitations  serratus punch    Other Supine Exercises  R shoulder circles at 90 dg flexion CW/CCW 1# x10 each      Shoulder Exercises: Sidelying   External Rotation  Right;AROM;10 reps    ABduction  Right;AAROM;10 reps    ABduction Limitations  scaption 20-90 dg      Shoulder Exercises: Standing   External Rotation  Right;10 reps;Theraband;Strengthening    Theraband Level (Shoulder External Rotation)  Level 1 (Yellow)    External Rotation Limitations  isometric step outs     Internal Rotation  Right;10 reps;Theraband;Strengthening    Theraband Level (Shoulder Internal Rotation)  Level 1 (Yellow)    Internal Rotation Limitations  isometric step outs    Flexion  AAROM;Right;10 reps    Flexion Limitations  wall ladder    ABduction  AAROM;Right;10 reps    ABduction Limitations  wall ladder- scaption    Extension  Both;10 reps;Theraband;Strengthening    Theraband Level (Shoulder Extension)  Level 1 (Yellow)    Extension Limitations  cues for scap squeeze & avoiding shoulder hike    Row  Both;10 reps;Theraband;Strengthening    Theraband Level (Shoulder Row)  Level 1 (Yellow)    Row Limitations  cues for scap squeeze      Shoulder Exercises: Pulleys   Flexion  3 minutes    ABduction  3 minutes    ABduction Limitations  scaption      Modalities   Modalities  Vasopneumatic      Vasopneumatic   Number Minutes Vasopneumatic   15 minutes     Vasopnuematic Location   Shoulder    Vasopneumatic Pressure  Low    Vasopneumatic Temperature   coldest temp      Manual Therapy   Manual Therapy  Soft tissue mobilization;Myofascial release    Manual therapy comments  patient sidelying    Soft tissue mobilization  STM to R pecs,  R ant/med deltoid, R teres group             PT Education - 01/06/18 1100    Education provided  Yes    Education Details  HEP update - yellow TB isometrics & rows    Person(s) Educated  Patient    Methods  Explanation;Demonstration;Handout    Comprehension  Verbalized understanding;Returned demonstration       PT Short Term Goals - 12/24/17 1404      PT SHORT TERM GOAL #1   Title  Patient to be independent with initial stretching/ROM HEP    Status  On-going      PT SHORT TERM GOAL #2   Title  Patient to improve R shoulder PROM essentially equivalent or greater than L shoudler without pain limiting     Status  On-going      PT SHORT TERM GOAL #3   Title  Patient to improve gross R shoulder strength to 3+/5    Status  On-going        PT Long Term Goals - 12/24/17 1405      PT LONG TERM GOAL #1   Title  Patient to be independent with advanced HEP for R shoulder strengthening    Status  On-going      PT LONG TERM GOAL #2   Title  Patient to improve R shoulder AROM essentially equivalent or greater than L shoudler without pain limiting     Status  On-going      PT LONG TERM GOAL #3   Title  Patient to demonstrate ability to perform ADLs and basic household chores without limitation due to R shoulder pain, LOM or weakness    Status  On-going      PT LONG TERM GOAL #4   Title  Patient to improve R shoulder strength to >/= 4-/5 to 4/5 for improved functinal use or R UE    Status  On-going            Plan - 01/06/18 1024    Clinical Impression Statement  Pt reporting feels like he is able to use the R arm more normally with every day activities at this stage than he did with rehab  for the L shoudler following TSA. Still having random days where he wakes with throbbing pain in anterior shoulder w/o known triggering event. Muscle tension improving in R ant/med deltoid but some taut band persist which may have been exacerbated by the drive to [place] over the weekend and contributed the pain upon waking this morning. Pt noting increased fatigue and tightness with some exercises today but no increased pain and able to relieve these symptoms by "shaking out the arm". HEP updated with yellow TB IR/ER isometrics & rows.    Rehab Potential  Good    PT Treatment/Interventions  Patient/family education;Neuromuscular re-education;ADLs/Self Care Home Management;Therapeutic exercise;Therapeutic activities;Manual techniques;Passive range of motion;Dry needling;Taping;Electrical Stimulation;Cryotherapy;Vasopneumatic Device;Moist Heat;Ultrasound;Iontophoresis 4mg /ml Dexamethasone    Consulted and Agree with Plan of Care  Patient       Patient will benefit from skilled therapeutic intervention in order to improve the following deficits and impairments:  Pain, Impaired UE functional use, Decreased strength, Decreased range of motion, Decreased activity tolerance, Decreased endurance  Visit Diagnosis: Acute pain of right shoulder  Stiffness of right shoulder, not elsewhere classified  Muscle weakness (generalized)  Abnormal posture     Problem List Patient Active Problem List   Diagnosis Date Noted  . Primary localized osteoarthrosis of right shoulder  11/05/2017  . Primary localized osteoarthrosis of shoulder 11/05/2017  . Lumbar stenosis with neurogenic claudication 07/19/2017  . Primary localized osteoarthrosis of left shoulder 12/25/2016  . S/P shoulder replacement 12/25/2016  . Lumbar spondylolysis 12/17/2016  . Paresthesia 01/24/2016  . 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 extremity 03/29/2015  . Cervical spinal stenosis 02/04/2015  . Bronchiectasis without acute  exacerbation ([place]) 01/11/2015  . Ulnar neuropathy at elbow 12/24/2014  . TIA (transient ischemic attack) 12/15/2014  . Hyperlipidemia 11/24/2014  . Hypoxia   . New onset a-fib ([place])   . MGUS (monoclonal gammopathy of unknown significance) 04/11/2012  . SEBORRHEIC KERATOSIS 11/09/2010  . CPK, ABNORMAL 09/03/2010  . DERMATOPHYTOSIS OF FOOT 08/03/2010  . MYALGIA 12/27/2009  . GOUT, UNSPECIFIED 10/04/2009  . HYPERTROPHY PROSTATE W/O UR OBST & OTH LUTS 12/14/2008  . ALLERGIC CONJUNCTIVITIS 12/03/2007  . TESTOSTERONE DEFICIENCY 11/14/2007  . Backache 04/22/2007  . HYPERLIPIDEMIA 12/26/2006  . OBESITY, NOS 12/26/2006  . 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 Hereditary and idiopathic peripheral neuropathy 12/26/2006  . HEARING LOSS NOS OR DEAFNESS 12/26/2006  . HYPERTENSION, BENIGN SYSTEMIC 12/26/2006  . OSTEOARTHRITIS, MULTI SITES 12/26/2006  . Musculoskeletal disorder and symptoms referable to neck 12/26/2006    Percival Spanish, PT, MPT 01/06/2018, 11:21 AM  [facility] [address]  [place], [place], [address] Phone: [phone]   Fax:  [phone]  Name: Jose [name]. MRN: [id number] Date of Birth: [birth year]

## 2018-01-09 ENCOUNTER — Encounter: Payer: Self-pay | Admitting: Physical Therapy

## 2018-01-09 ENCOUNTER — Ambulatory Visit: Payer: Medicare Other | Admitting: Physical Therapy

## 2018-01-09 DIAGNOSIS — M25611 Stiffness of right shoulder, not elsewhere classified: Secondary | ICD-10-CM

## 2018-01-09 DIAGNOSIS — R293 Abnormal posture: Secondary | ICD-10-CM

## 2018-01-09 DIAGNOSIS — M25511 Pain in right shoulder: Secondary | ICD-10-CM

## 2018-01-09 DIAGNOSIS — M6281 Muscle weakness (generalized): Secondary | ICD-10-CM

## 2018-01-09 NOTE — Therapy (Signed)
[facility] [address]  [place], [place], [address] Phone: [phone]   Fax:  [phone]  Physical Therapy Treatment  Patient Details  Name: Jose [name]. MRN: [id number] Date of Birth: July 29, 1936 Referring Provider: Marchia Bond, MD   Encounter Date: 01/09/2018  PT End of Session - 01/09/18 1023    Visit Number  7    Number of Visits  20    Date for PT Re-Evaluation  02/14/18    Authorization Type  Medicare & Tricare - PT only    PT Start Time  1023    PT Stop Time  1130    PT Time Calculation (min)  67 min    Activity Tolerance  Patient tolerated treatment well    Behavior During Therapy  WFL for tasks assessed/performed       Past Medical History:  Diagnosis Date  . Arthritis   . Dysrhythmia    afib  . Gallstones 07/1996  . History of CT scan of head 1997   small vessel disease  . History of ETT 05/2002    no EKG changes  . History of MRI of cervical spine 07/15/2002  . Hypertension   . Lumbar spondylolysis 12/17/2016  . MGUS (monoclonal gammopathy of unknown significance) 04/11/2012  . Peripheral neuropathy   . Pneumonia 10/2014  . PONV (postoperative nausea and vomiting)    once many years ago  . Primary localized osteoarthrosis of left shoulder 12/25/2016  . Primary localized osteoarthrosis of right shoulder 11/05/2017  . Rectal bleeding 12/2002   colonoscopy  . Seizures ([place])    last seizure 1976  . Skin cancer   . Sleep apnea    uses CPAP  . Spinal stenosis, lumbar region with neurogenic claudication   . Stroke [facility])    ?tia ?afib  . TIA (transient ischemic attack)   . 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 ANTERIOR CERVICAL DECOMP/DISCECTOMY FUSION N/A 02/04/2015   Procedure: ANTERIOR CERVICAL DECOMPRESSION/DISCECTOMY FUSION CERVICAL FIVE-SIX,CERVICAL  SIX-SEVEN;  Surgeon: Karie Chimera, MD;  Location: [place] [facility];  Service: Neurosurgery;  Laterality: N/A;  . BACK SURGERY    . CATARACT EXTRACTION Bilateral   . COLONOSCOPY W/ POLYPECTOMY  04/19/2003   tubular adenoma  . EYE SURGERY     Bilateral Cataracts  . GREAT TOE ARTHRODESIS, INTERPHALANGEAL JOINT Left 1978  . JOINT REPLACEMENT    . joint replacement on right thumb Right   . LUMBAR LAMINECTOMY/DECOMPRESSION MICRODISCECTOMY N/A 07/19/2017   Procedure: LAMINECTOMY AND FORAMINOTOMY  LUMBAR TWO- LUMBAR THREE, LUMBAR THREE- LUMBAR FOUR;  Surgeon: Ashok Pall, MD;  Location: [place];  Service: Neurosurgery;  Laterality: N/A;  . nerve transfer surgery to lwft hand Left    Nov. 2017  . SHOULDER ARTHROSCOPY W/ ROTATOR CUFF REPAIR Left 13   rotator cuff   . SHOULDER INJECTION  June 2012   left, [place]  . SKIN CANCER EXCISION  07/1993   left lateral arm  . SUPERFICIAL KERATECTOMY Right 05/1998   wrist  . thumb injection  June 2012   left, [place]  . TONSILLECTOMY AND ADENOIDECTOMY    . TOTAL SHOULDER ARTHROPLASTY Left 12/25/2016   Procedure: TOTAL SHOULDER ARTHROPLASTY;  Surgeon: Marchia Bond, MD;  Location: [place];  Service: Orthopedics;  Laterality: Left;  . TOTAL SHOULDER ARTHROPLASTY Right 11/05/2017  .  TOTAL SHOULDER ARTHROPLASTY Right 11/05/2017   Procedure: TOTAL SHOULDER ARTHROPLASTY;  Surgeon: Marchia Bond, MD;  Location: [place];  Service: Orthopedics;  Laterality: Right;  . ulnar nerve transfer  12/1999   left  . ULNAR NERVE TRANSPOSITION Left     There were no vitals filed for this visit.  Subjective Assessment - 01/09/18 1027    Subjective  Pt reporting pain is mostly in the back of the arm/shoulder at this point.    Pertinent History  R TSA 11/05/17    Patient Stated Goals  "I want my R shoudler as good as my L"    Currently in Pain?  Yes    Pain Score  -- 2-3/10 at rest, up to 8/10 with activity    Pain Location  Shoulder    Pain Orientation  Right;Posterior    Pain  Descriptors / Indicators  Sore    Pain Type  Surgical pain;Acute pain    Pain Frequency  Constant         [facility] PT Assessment - 01/09/18 1023      Assessment   Medical Diagnosis  R TSA      PROM   Right Shoulder Flexion  137 Degrees    Right Shoulder ABduction  131 Degrees    Right Shoulder Internal Rotation  72 Degrees at 60 dg ABD    Right Shoulder External Rotation  60 Degrees at 60 dg ABD                  [facility] Adult PT Treatment/Exercise - 01/09/18 1023      Exercises   Exercises  Shoulder      Shoulder Exercises: Sidelying   External Rotation  Right;AROM;10 reps    External Rotation Limitations  decreased pain following manual therapy      Shoulder Exercises: Pulleys   Flexion  3 minutes    Scaption  3 minutes      Modalities   Modalities  Vasopneumatic;Electrical Stimulation      Electrical Stimulation   Electrical Stimulation Location  R shoulder    Electrical Stimulation Action  IFC    Electrical Stimulation Parameters  80-150 Hz, intenisty to pt tol x15'    Electrical Stimulation Goals  Pain      Vasopneumatic   Number Minutes Vasopneumatic   15 minutes    Vasopnuematic Location   Shoulder    Vasopneumatic Pressure  Low    Vasopneumatic Temperature   coldest temp      Manual Therapy   Manual Therapy  Soft tissue mobilization;Myofascial release;Scapular mobilization;Joint mobilization;Passive ROM    Manual therapy comments  pt supine & L sidelying    Soft tissue mobilization  STM to R teres group, infraspinatus, subscapularis & lats    Myofascial Release  manual TPR to R infraspinatus, teres major/minor & lats    Scapular Mobilization  R scapula - all motions, emphasis on medial rotation    Passive ROM  PROM R GH joint all planes - IR/ER at 45-60 degrees abd.                PT Short Term Goals - 01/09/18 1031      PT SHORT TERM GOAL #1   Title  Patient to be independent with initial stretching/ROM HEP    Status  Achieved       PT SHORT TERM GOAL #2   Title  Patient to improve R shoulder PROM essentially equivalent or greater than L shoudler without pain  limiting     Status  On-going      PT SHORT TERM GOAL #3   Title  Patient to improve gross R shoulder strength to 3+/5    Status  On-going        PT Long Term Goals - 12/24/17 1405      PT LONG TERM GOAL #1   Title  Patient to be independent with advanced HEP for R shoulder strengthening    Status  On-going      PT LONG TERM GOAL #2   Title  Patient to improve R shoulder AROM essentially equivalent or greater than L shoudler without pain limiting     Status  On-going      PT LONG TERM GOAL #3   Title  Patient to demonstrate ability to perform ADLs and basic household chores without limitation due to R shoulder pain, LOM or weakness    Status  On-going      PT LONG TERM GOAL #4   Title  Patient to improve R shoulder strength to >/= 4-/5 to 4/5 for improved functinal use or R UE    Status  On-going            Plan - 01/09/18 1100    Clinical Impression Statement  Pt noting increased posterior shoulder soreness to day and feels like he may have overdone it with the ER/IR isometric walk-outs at home. Mutiple taut bands/TPs identified t/o posterior shoudler musculature - addressed with manual therapy including TPR with mutiple twitches elicited. PROM improved and P/AROM with less pain following manual therapy. Treatment concluded with estim during vasopuematic compression to promote further pain reduction and muscle relaxation.    Rehab Potential  Good    PT Treatment/Interventions  Patient/family education;Neuromuscular re-education;ADLs/Self Care Home Management;Therapeutic exercise;Therapeutic activities;Manual techniques;Passive range of motion;Dry needling;Taping;Electrical Stimulation;Cryotherapy;Vasopneumatic Device;Moist Heat;Ultrasound;Iontophoresis 4mg /ml Dexamethasone    Consulted and Agree with Plan of Care  Patient       Patient will  benefit from skilled therapeutic intervention in order to improve the following deficits and impairments:  Pain, Impaired UE functional use, Decreased strength, Decreased range of motion, Decreased activity tolerance, Decreased endurance  Visit Diagnosis: Acute pain of right shoulder  Stiffness of right shoulder, not elsewhere classified  Muscle weakness (generalized)  Abnormal posture     Problem List Patient Active Problem List   Diagnosis Date Noted  . Primary localized osteoarthrosis of right shoulder 11/05/2017  . Primary localized osteoarthrosis of shoulder 11/05/2017  . Lumbar stenosis with neurogenic claudication 07/19/2017  . Primary localized osteoarthrosis of left shoulder 12/25/2016  . S/P shoulder replacement 12/25/2016  . Lumbar spondylolysis 12/17/2016  . Paresthesia 01/24/2016  . 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 extremity 03/29/2015  . Cervical spinal stenosis 02/04/2015  . Bronchiectasis without acute exacerbation ([place]) 01/11/2015  . Ulnar neuropathy at elbow 12/24/2014  . TIA (transient ischemic attack) 12/15/2014  . Hyperlipidemia 11/24/2014  . Hypoxia   . New onset a-fib ([place])   . MGUS (monoclonal gammopathy of unknown significance) 04/11/2012  . SEBORRHEIC KERATOSIS 11/09/2010  . CPK, ABNORMAL 09/03/2010  . DERMATOPHYTOSIS OF FOOT 08/03/2010  . MYALGIA 12/27/2009  . GOUT, UNSPECIFIED 10/04/2009  . HYPERTROPHY PROSTATE W/O UR OBST & OTH LUTS 12/14/2008  . ALLERGIC CONJUNCTIVITIS 12/03/2007  . TESTOSTERONE DEFICIENCY 11/14/2007  . Backache 04/22/2007  . HYPERLIPIDEMIA 12/26/2006  . OBESITY, NOS 12/26/2006  . 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 Hereditary and idiopathic peripheral neuropathy 12/26/2006  . HEARING LOSS NOS OR DEAFNESS 12/26/2006  .  HYPERTENSION, BENIGN SYSTEMIC 12/26/2006  . OSTEOARTHRITIS, MULTI SITES 12/26/2006  . Musculoskeletal disorder and symptoms referable to neck 12/26/2006    Percival Spanish, PT, MPT 01/09/2018, 12:28  PM  [facility] [address]  [place], [place], [address] Phone: [phone]   Fax:  [phone]  Name: Jose [name]. MRN: [id number] Date of Birth: 1936-01-10

## 2018-01-13 ENCOUNTER — Encounter: Payer: Self-pay | Admitting: Physical Therapy

## 2018-01-13 ENCOUNTER — Ambulatory Visit: Payer: Medicare Other | Admitting: Physical Therapy

## 2018-01-13 DIAGNOSIS — M25511 Pain in right shoulder: Secondary | ICD-10-CM | POA: Diagnosis not present

## 2018-01-13 DIAGNOSIS — R293 Abnormal posture: Secondary | ICD-10-CM

## 2018-01-13 DIAGNOSIS — M25611 Stiffness of right shoulder, not elsewhere classified: Secondary | ICD-10-CM

## 2018-01-13 DIAGNOSIS — M6281 Muscle weakness (generalized): Secondary | ICD-10-CM

## 2018-01-13 NOTE — Therapy (Signed)
[facility] [address]  [place], [place], [address] Phone: [phone]   Fax:  [phone]  Physical Therapy Treatment  Patient Details  Name: Jose [name]. MRN: [id number] Date of Birth: Feb 24, 1936 Referring Provider: Marchia Bond, MD   Encounter Date: 01/13/2018  PT End of Session - 01/13/18 1008    Visit Number  8    Number of Visits  20    Date for PT Re-Evaluation  02/14/18    Authorization Type  Medicare & Tricare - PT only    PT Start Time  1008    PT Stop Time  1114    PT Time Calculation (min)  66 min    Activity Tolerance  Patient tolerated treatment well    Behavior During Therapy  WFL for tasks assessed/performed       Past Medical History:  Diagnosis Date  . Arthritis   . Dysrhythmia    afib  . Gallstones 07/1996  . History of CT scan of head 1997   small vessel disease  . History of ETT 05/2002    no EKG changes  . History of MRI of cervical spine 07/15/2002  . Hypertension   . Lumbar spondylolysis 12/17/2016  . MGUS (monoclonal gammopathy of unknown significance) 04/11/2012  . Peripheral neuropathy   . Pneumonia 10/2014  . PONV (postoperative nausea and vomiting)    once many years ago  . Primary localized osteoarthrosis of left shoulder 12/25/2016  . Primary localized osteoarthrosis of right shoulder 11/05/2017  . Rectal bleeding 12/2002   colonoscopy  . Seizures ([place])    last seizure 1976  . Skin cancer   . Sleep apnea    uses CPAP  . Spinal stenosis, lumbar region with neurogenic claudication   . Stroke [facility])    ?tia ?afib  . TIA (transient ischemic attack)   . 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 ANTERIOR CERVICAL DECOMP/DISCECTOMY FUSION N/A 02/04/2015   Procedure: ANTERIOR CERVICAL DECOMPRESSION/DISCECTOMY FUSION CERVICAL FIVE-SIX,CERVICAL  SIX-SEVEN;  Surgeon: Karie Chimera, MD;  Location: [place] [facility];  Service: Neurosurgery;  Laterality: N/A;  . BACK SURGERY    . CATARACT EXTRACTION Bilateral   . COLONOSCOPY W/ POLYPECTOMY  04/19/2003   tubular adenoma  . EYE SURGERY     Bilateral Cataracts  . GREAT TOE ARTHRODESIS, INTERPHALANGEAL JOINT Left 1978  . JOINT REPLACEMENT    . joint replacement on right thumb Right   . LUMBAR LAMINECTOMY/DECOMPRESSION MICRODISCECTOMY N/A 07/19/2017   Procedure: LAMINECTOMY AND FORAMINOTOMY  LUMBAR TWO- LUMBAR THREE, LUMBAR THREE- LUMBAR FOUR;  Surgeon: Ashok Pall, MD;  Location: [place];  Service: Neurosurgery;  Laterality: N/A;  . nerve transfer surgery to lwft hand Left    Nov. 2017  . SHOULDER ARTHROSCOPY W/ ROTATOR CUFF REPAIR Left 13   rotator cuff   . SHOULDER INJECTION  June 2012   left, [place]  . SKIN CANCER EXCISION  07/1993   left lateral arm  . SUPERFICIAL KERATECTOMY Right 05/1998   wrist  . thumb injection  June 2012   left, [place]  . TONSILLECTOMY AND ADENOIDECTOMY    . TOTAL SHOULDER ARTHROPLASTY Left 12/25/2016   Procedure: TOTAL SHOULDER ARTHROPLASTY;  Surgeon: Marchia Bond, MD;  Location: [place];  Service: Orthopedics;  Laterality: Left;  . TOTAL SHOULDER ARTHROPLASTY Right 11/05/2017  .  TOTAL SHOULDER ARTHROPLASTY Right 11/05/2017   Procedure: TOTAL SHOULDER ARTHROPLASTY;  Surgeon: Marchia Bond, MD;  Location: [place];  Service: Orthopedics;  Laterality: Right;  . ulnar nerve transfer  12/1999   left  . ULNAR NERVE TRANSPOSITION Left     There were no vitals filed for this visit.  Subjective Assessment - 01/13/18 1014    Subjective  Pt still noting pain in R anterior shoulder upon waking in the morning, which resolves within a few hours once up and active. Feels like he has been able to use R arm more functionally at this stage than he did following L TSA.    Pertinent History  R TSA 11/05/17    Patient Stated Goals  "I want my R shoudler as good as my L"    Currently  in Pain?  No/denies         [facility] PT Assessment - 01/13/18 1008      AROM   Right Shoulder Flexion  142 Degrees supine                  [facility] Adult PT Treatment/Exercise - 01/13/18 1008      Exercises   Exercises  Shoulder      Shoulder Exercises: Supine   Protraction  Right;15 reps;Weights;Strengthening    Protraction Weight (lbs)  3    Protraction Limitations  serratus punch    Flexion  Right;15 reps;AROM;Strengthening;Weights    Shoulder Flexion Weight (lbs)  1    Flexion Limitations  1st set - AROM, 2nd set - 1#    Other Supine Exercises  R shoulder circles at 90 dg flexion CW/CCW 2# x15 each    Other Supine Exercises  R triceps 2# x15 reps      Shoulder Exercises: Seated   Flexion  Right;AROM;12 reps    Flexion Limitations  45-90 dg    Abduction  Right;AROM;10 reps    ABduction Limitations  visual (mirror) & tactile cue to avoid hiking shoulder      Shoulder Exercises: Prone   Retraction  Right;AROM;15 reps    Retraction Limitations  row    Extension  Right;AROM;15 reps    Extension Limitations  cues for scap retraction      Shoulder Exercises: Sidelying   External Rotation  Right;AROM;15 reps    External Rotation Limitations  "popping" noted in lateral shoulder    ABduction  Right;AROM;15 reps    ABduction Limitations  scaption 20-100 dg    Other Sidelying Exercises  R horizontal abduction x 10 reps      Shoulder Exercises: Pulleys   Flexion  3 minutes    Scaption  3 minutes      Modalities   Modalities  Vasopneumatic;Electrical Stimulation      Electrical Stimulation   Electrical Stimulation Location  R shoulder    Electrical Stimulation Action  IFC    Electrical Stimulation Parameters  80-150 Hz, intenisty to pt tol x15'    Electrical Stimulation Goals  Pain      Vasopneumatic   Number Minutes Vasopneumatic   15 minutes    Vasopnuematic Location   Shoulder    Vasopneumatic Pressure  Low    Vasopneumatic Temperature   coldest temp       Manual Therapy   Manual Therapy  Soft tissue mobilization;Scapular mobilization    Manual therapy comments  pt supine & L sidelying    Soft tissue mobilization  STM to R teres group, infraspinatus, subscapularis & lats  Scapular Mobilization  R scapula - all motions, emphasis on medial rotation               PT Short Term Goals - 01/09/18 1031      PT SHORT TERM GOAL #1   Title  Patient to be independent with initial stretching/ROM HEP    Status  Achieved      PT SHORT TERM GOAL #2   Title  Patient to improve R shoulder PROM essentially equivalent or greater than L shoudler without pain limiting     Status  On-going      PT SHORT TERM GOAL #3   Title  Patient to improve gross R shoulder strength to 3+/5    Status  On-going        PT Long Term Goals - 12/24/17 1405      PT LONG TERM GOAL #1   Title  Patient to be independent with advanced HEP for R shoulder strengthening    Status  On-going      PT LONG TERM GOAL #2   Title  Patient to improve R shoulder AROM essentially equivalent or greater than L shoudler without pain limiting     Status  On-going      PT LONG TERM GOAL #3   Title  Patient to demonstrate ability to perform ADLs and basic household chores without limitation due to R shoulder pain, LOM or weakness    Status  On-going      PT LONG TERM GOAL #4   Title  Patient to improve R shoulder strength to >/= 4-/5 to 4/5 for improved functinal use or R UE    Status  On-going            Plan - 01/13/18 1018    Clinical Impression Statement  Pt continuing to report anterolateral R shoudler pain upon waking in the morning which typically resolves within a few hours of normal activity. Continued tightness noted in posterior capusle and deltoids, but less ttp than previously. Some "popping" noted with scapular retraction activities, most notable during sidelying ER with pt noting some discomfort with this but no other scap retraction activities. R shoulder  flexion in supine nearing equivalent to L.    Rehab Potential  Good    PT Treatment/Interventions  Patient/family education;Neuromuscular re-education;ADLs/Self Care Home Management;Therapeutic exercise;Therapeutic activities;Manual techniques;Passive range of motion;Dry needling;Taping;Electrical Stimulation;Cryotherapy;Vasopneumatic Device;Moist Heat;Ultrasound;Iontophoresis 4mg /ml Dexamethasone    Consulted and Agree with Plan of Care  Patient       Patient will benefit from skilled therapeutic intervention in order to improve the following deficits and impairments:  Pain, Impaired UE functional use, Decreased strength, Decreased range of motion, Decreased activity tolerance, Decreased endurance  Visit Diagnosis: Acute pain of right shoulder  Stiffness of right shoulder, not elsewhere classified  Muscle weakness (generalized)  Abnormal posture     Problem List Patient Active Problem List   Diagnosis Date Noted  . Primary localized osteoarthrosis of right shoulder 11/05/2017  . Primary localized osteoarthrosis of shoulder 11/05/2017  . Lumbar stenosis with neurogenic claudication 07/19/2017  . Primary localized osteoarthrosis of left shoulder 12/25/2016  . S/P shoulder replacement 12/25/2016  . Lumbar spondylolysis 12/17/2016  . Paresthesia 01/24/2016  . 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 extremity 03/29/2015  . Cervical spinal stenosis 02/04/2015  . Bronchiectasis without acute exacerbation ([place]) 01/11/2015  . Ulnar neuropathy at elbow 12/24/2014  . TIA (transient ischemic attack) 12/15/2014  . Hyperlipidemia 11/24/2014  . Hypoxia   . New  onset a-fib ([place])   . MGUS (monoclonal gammopathy of unknown significance) 04/11/2012  . SEBORRHEIC KERATOSIS 11/09/2010  . CPK, ABNORMAL 09/03/2010  . DERMATOPHYTOSIS OF FOOT 08/03/2010  . MYALGIA 12/27/2009  . GOUT, UNSPECIFIED 10/04/2009  . HYPERTROPHY PROSTATE W/O UR OBST & OTH LUTS 12/14/2008  . ALLERGIC CONJUNCTIVITIS  12/03/2007  . TESTOSTERONE DEFICIENCY 11/14/2007  . Backache 04/22/2007  . HYPERLIPIDEMIA 12/26/2006  . OBESITY, NOS 12/26/2006  . 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 Hereditary and idiopathic peripheral neuropathy 12/26/2006  . HEARING LOSS NOS OR DEAFNESS 12/26/2006  . HYPERTENSION, BENIGN SYSTEMIC 12/26/2006  . OSTEOARTHRITIS, MULTI SITES 12/26/2006  . Musculoskeletal disorder and symptoms referable to neck 12/26/2006    Percival Spanish, PT, MPT 01/13/2018, 11:31 AM  [facility] [address]  [place], [place], [address] Phone: [phone]   Fax:  [phone]  Name: Jose [name]. MRN: [id number] Date of Birth: 1935/12/11

## 2018-01-16 ENCOUNTER — Ambulatory Visit: Payer: Medicare Other | Admitting: Physical Therapy

## 2018-01-16 ENCOUNTER — Encounter: Payer: Self-pay | Admitting: Physical Therapy

## 2018-01-16 DIAGNOSIS — M6281 Muscle weakness (generalized): Secondary | ICD-10-CM

## 2018-01-16 DIAGNOSIS — R293 Abnormal posture: Secondary | ICD-10-CM

## 2018-01-16 DIAGNOSIS — M25611 Stiffness of right shoulder, not elsewhere classified: Secondary | ICD-10-CM

## 2018-01-16 DIAGNOSIS — M25511 Pain in right shoulder: Secondary | ICD-10-CM

## 2018-01-16 NOTE — Therapy (Signed)
[facility] [address]  [place], [place], [address] Phone: [phone]   Fax:  [phone]  Physical Therapy Treatment  Patient Details  Name: Jose [name]. MRN: [id number] Date of Birth: [birth year] Referring Provider: Dr. Marchia Bond, MD   Encounter Date: 01/16/2018  PT End of Session - 01/16/18 1100    Visit Number  9    Number of Visits  20    Date for PT Re-Evaluation  02/14/18    Authorization Type  Medicare & Tricare - PT only    PT Start Time  1058    PT Stop Time  1156    PT Time Calculation (min)  58 min    Activity Tolerance  Patient tolerated treatment well    Behavior During Therapy  [facility] for tasks assessed/performed       Past Medical History:  Diagnosis Date  . Arthritis   . Dysrhythmia    afib  . Gallstones 07/1996  . History of CT scan of head 1997   small vessel disease  . History of ETT 05/2002    no EKG changes  . History of MRI of cervical spine 07/15/2002  . Hypertension   . Lumbar spondylolysis 12/17/2016  . MGUS (monoclonal gammopathy of unknown significance) 04/11/2012  . Peripheral neuropathy   . Pneumonia 10/2014  . PONV (postoperative nausea and vomiting)    once many years ago  . Primary localized osteoarthrosis of left shoulder 12/25/2016  . Primary localized osteoarthrosis of right shoulder 11/05/2017  . Rectal bleeding 12/2002   colonoscopy  . Seizures ([place])    last seizure 1976  . Skin cancer   . Sleep apnea    uses CPAP  . Spinal stenosis, lumbar region with neurogenic claudication   . Stroke [facility])    ?tia ?afib  . TIA (transient ischemic attack)   . 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 ANTERIOR CERVICAL DECOMP/DISCECTOMY FUSION N/A 02/04/2015   Procedure: ANTERIOR CERVICAL DECOMPRESSION/DISCECTOMY FUSION CERVICAL FIVE-SIX,CERVICAL  SIX-SEVEN;  Surgeon: Karie Chimera, MD;  Location: [place] [facility]UR[facility];  Service: Neurosurgery;  Laterality: N/A;  . BACK SURGERY    . CATARACT EXTRACTION Bilateral   . COLONOSCOPY W/ POLYPECTOMY  04/19/2003   tubular adenoma  . EYE SURGERY     Bilateral Cataracts  . GREAT TOE ARTHRODESIS, INTERPHALANGEAL JOINT Left 1978  . JOINT REPLACEMENT    . joint replacement on right thumb Right   . LUMBAR LAMINECTOMY/DECOMPRESSION MICRODISCECTOMY N/A 07/19/2017   Procedure: LAMINECTOMY AND FORAMINOTOMY  LUMBAR TWO- LUMBAR THREE, LUMBAR THREE- LUMBAR FOUR;  Surgeon: Ashok Pall, MD;  Location: [place];  Service: Neurosurgery;  Laterality: N/A;  . nerve transfer surgery to lwft hand Left    Nov. 2017  . SHOULDER ARTHROSCOPY W/ ROTATOR CUFF REPAIR Left 13   rotator cuff   . SHOULDER INJECTION  June 2012   left, [place]  . SKIN CANCER EXCISION  07/1993   left lateral arm  . SUPERFICIAL KERATECTOMY Right 05/1998   wrist  . thumb injection  June 2012   left, [place]  . TONSILLECTOMY AND ADENOIDECTOMY    . TOTAL SHOULDER ARTHROPLASTY Left 12/25/2016   Procedure: TOTAL SHOULDER ARTHROPLASTY;  Surgeon: Marchia Bond, MD;  Location: [place];  Service: Orthopedics;  Laterality: Left;  . TOTAL SHOULDER ARTHROPLASTY Right 11/05/2017  .  TOTAL SHOULDER ARTHROPLASTY Right 11/05/2017   Procedure: TOTAL SHOULDER ARTHROPLASTY;  Surgeon: Marchia Bond, MD;  Location: [place];  Service: Orthopedics;  Laterality: Right;  . ulnar nerve transfer  12/1999   left  . ULNAR NERVE TRANSPOSITION Left     There were no vitals filed for this visit.  Subjective Assessment - 01/16/18 1059    Subjective  feeling much better - re-use of foam pad for sleeping as well as pec stretch prior to going to bed    Pertinent History  R TSA 11/05/17    Patient Stated Goals  "I want my R shoudler as good as my L"    Currently in Pain?  Yes    Pain Score  2     Pain Location  Shoulder    Pain Orientation  Right    Pain Descriptors / Indicators   Sore;Aching    Pain Type  Acute pain;Surgical pain         [facility] PT Assessment - 01/16/18 0001      Assessment   Medical Diagnosis  R TSA    Referring Provider  Dr. Marchia Bond, MD    Onset Date/Surgical Date  11/05/17    Next MD Visit  01/17/18      PROM   Right/Left Shoulder  Right    Right Shoulder Flexion  125 Degrees    Right Shoulder ABduction  101 Degrees                  [facility] Adult PT Treatment/Exercise - 01/16/18 1102      Shoulder Exercises: Seated   Flexion  Right;AROM;12 reps    Flexion Limitations  to tolerance    Abduction  Right;AROM;12 reps    ABduction Limitations  to tolerance - mirror      Shoulder Exercises: Standing   External Rotation  Strengthening;Right;10 reps;Theraband 2 sets    Theraband Level (Shoulder External Rotation)  Level 1 (Yellow)    Internal Rotation  Strengthening;Right;10 reps;Theraband 2 sets    Theraband Level (Shoulder Internal Rotation)  Level 1 (Yellow)    Other Standing Exercises  R UE - bicep curl - 2# x 15      Shoulder Exercises: Pulleys   Flexion  3 minutes    Scaption  3 minutes      Shoulder Exercises: ROM/Strengthening   UBE (Upper Arm Bike)  L1.5 x 4 min (2/2)      Vasopneumatic   Number Minutes Vasopneumatic   15 minutes    Vasopnuematic Location   Shoulder    Vasopneumatic Pressure  Low    Vasopneumatic Temperature   coldest temp      Manual Therapy   Manual Therapy  Soft tissue mobilization    Manual therapy comments  self-massage with ball against wall    Soft tissue mobilization  STM to R infra, R UT, R teres group               PT Short Term Goals - 01/09/18 1031      PT SHORT TERM GOAL #1   Title  Patient to be independent with initial stretching/ROM HEP    Status  Achieved      PT SHORT TERM GOAL #2   Title  Patient to improve R shoulder PROM essentially equivalent or greater than L shoudler without pain limiting     Status  On-going      PT SHORT TERM GOAL #3   Title   Patient to improve  gross R shoulder strength to 3+/5    Status  On-going        PT Long Term Goals - 12/24/17 1405      PT LONG TERM GOAL #1   Title  Patient to be independent with advanced HEP for R shoulder strengthening    Status  On-going      PT LONG TERM GOAL #2   Title  Patient to improve R shoulder AROM essentially equivalent or greater than L shoudler without pain limiting     Status  On-going      PT LONG TERM GOAL #3   Title  Patient to demonstrate ability to perform ADLs and basic household chores without limitation due to R shoulder pain, LOM or weakness    Status  On-going      PT LONG TERM GOAL #4   Title  Patient to improve R shoulder strength to >/= 4-/5 to 4/5 for improved functinal use or R UE    Status  On-going            Plan - 01/16/18 1100    Clinical Impression Statement  Good improvements in pain levels since using foam pad with sleeping as well as pec stretch prior to sleep. Subjective reports of trying to [facility] UE with more daily activities - with reaching overhead the hardest tasks, as expected. Making good progress with PT intervention within phase of protocol with good progression of active ROM and general RTC strengthening. Patient making good progress towards goals.     PT Treatment/Interventions  Patient/family education;Neuromuscular re-education;ADLs/Self Care Home Management;Therapeutic exercise;Therapeutic activities;Manual techniques;Passive range of motion;Dry needling;Taping;Electrical Stimulation;Cryotherapy;Vasopneumatic Device;Moist Heat;Ultrasound;Iontophoresis 4mg /ml Dexamethasone    Consulted and Agree with Plan of Care  Patient       Patient will benefit from skilled therapeutic intervention in order to improve the following deficits and impairments:  Pain, Impaired UE functional use, Decreased strength, Decreased range of motion, Decreased activity tolerance, Decreased endurance  Visit Diagnosis: Acute pain of right  shoulder  Stiffness of right shoulder, not elsewhere classified  Muscle weakness (generalized)  Abnormal posture     Problem List Patient Active Problem List   Diagnosis Date Noted  . Primary localized osteoarthrosis of right shoulder 11/05/2017  . Primary localized osteoarthrosis of shoulder 11/05/2017  . Lumbar stenosis with neurogenic claudication 07/19/2017  . Primary localized osteoarthrosis of left shoulder 12/25/2016  . S/P shoulder replacement 12/25/2016  . Lumbar spondylolysis 12/17/2016  . Paresthesia 01/24/2016  . 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 extremity 03/29/2015  . Cervical spinal stenosis 02/04/2015  . Bronchiectasis without acute exacerbation ([place]) 01/11/2015  . Ulnar neuropathy at elbow 12/24/2014  . TIA (transient ischemic attack) 12/15/2014  . Hyperlipidemia 11/24/2014  . Hypoxia   . New onset a-fib ([place])   . MGUS (monoclonal gammopathy of unknown significance) 04/11/2012  . SEBORRHEIC KERATOSIS 11/09/2010  . CPK, ABNORMAL 09/03/2010  . DERMATOPHYTOSIS OF FOOT 08/03/2010  . MYALGIA 12/27/2009  . GOUT, UNSPECIFIED 10/04/2009  . HYPERTROPHY PROSTATE W/O UR OBST & OTH LUTS 12/14/2008  . ALLERGIC CONJUNCTIVITIS 12/03/2007  . TESTOSTERONE DEFICIENCY 11/14/2007  . Backache 04/22/2007  . HYPERLIPIDEMIA 12/26/2006  . OBESITY, NOS 12/26/2006  . 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 Hereditary and idiopathic peripheral neuropathy 12/26/2006  . HEARING LOSS NOS OR DEAFNESS 12/26/2006  . HYPERTENSION, BENIGN SYSTEMIC 12/26/2006  . OSTEOARTHRITIS, MULTI SITES 12/26/2006  . Musculoskeletal disorder and symptoms referable to neck 12/26/2006     Lanney Gins, PT, DPT 01/16/18 12:57  PM   [facility] [address]  [place], [place], [address] Phone: [phone]   Fax:  [phone]  Name: Jose [name]. MRN: [id number] Date of Birth: [birth year]

## 2018-01-20 ENCOUNTER — Encounter: Payer: Self-pay | Admitting: Physical Therapy

## 2018-01-20 ENCOUNTER — Ambulatory Visit: Payer: Medicare Other | Admitting: Physical Therapy

## 2018-01-20 DIAGNOSIS — M25511 Pain in right shoulder: Secondary | ICD-10-CM

## 2018-01-20 DIAGNOSIS — M6281 Muscle weakness (generalized): Secondary | ICD-10-CM

## 2018-01-20 DIAGNOSIS — M25611 Stiffness of right shoulder, not elsewhere classified: Secondary | ICD-10-CM

## 2018-01-20 DIAGNOSIS — R293 Abnormal posture: Secondary | ICD-10-CM

## 2018-01-20 NOTE — Therapy (Addendum)
[facility] [address]  [place], [place], [address] Phone: [phone]   Fax:  [phone]  Physical Therapy Treatment  Patient Details  Name: Jose [name]. MRN: [id number] Date of Birth: 13-Aug-1936 Referring Provider: Dr. Marchia Bond, MD   Encounter Date: 01/20/2018  PT End of Session - 01/20/18 1017    Visit Number  10    Number of Visits  20    Date for PT Re-Evaluation  02/14/18    Authorization Type  Medicare & Tricare - PT only    PT Start Time  1017    PT Stop Time  1115    PT Time Calculation (min)  58 min    Activity Tolerance  Patient tolerated treatment well    Behavior During Therapy  [facility] for tasks assessed/performed       Past Medical History:  Diagnosis Date  . Arthritis   . Dysrhythmia    afib  . Gallstones 07/1996  . History of CT scan of head 1997   small vessel disease  . History of ETT 05/2002    no EKG changes  . History of MRI of cervical spine 07/15/2002  . Hypertension   . Lumbar spondylolysis 12/17/2016  . MGUS (monoclonal gammopathy of unknown significance) 04/11/2012  . Peripheral neuropathy   . Pneumonia 10/2014  . PONV (postoperative nausea and vomiting)    once many years ago  . Primary localized osteoarthrosis of left shoulder 12/25/2016  . Primary localized osteoarthrosis of right shoulder 11/05/2017  . Rectal bleeding 12/2002   colonoscopy  . Seizures ([place])    last seizure 1976  . Skin cancer   . Sleep apnea    uses CPAP  . Spinal stenosis, lumbar region with neurogenic claudication   . Stroke [facility])    ?tia ?afib  . TIA (transient ischemic attack)   . 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 ANTERIOR CERVICAL DECOMP/DISCECTOMY FUSION N/A 02/04/2015   Procedure: ANTERIOR CERVICAL DECOMPRESSION/DISCECTOMY FUSION CERVICAL FIVE-SIX,CERVICAL  SIX-SEVEN;  Surgeon: Karie Chimera, MD;  Location: [place] NEUR[facility]S;  Service: Neurosurgery;  Laterality: N/A;  . BACK SURGERY    . CATARACT EXTRACTION Bilateral   . COLONOSCOPY W/ POLYPECTOMY  04/19/2003   tubular adenoma  . EYE SURGERY     Bilateral Cataracts  . GREAT TOE ARTHRODESIS, INTERPHALANGEAL JOINT Left 1978  . JOINT REPLACEMENT    . joint replacement on right thumb Right   . LUMBAR LAMINECTOMY/DECOMPRESSION MICRODISCECTOMY N/A 07/19/2017   Procedure: LAMINECTOMY AND FORAMINOTOMY  LUMBAR TWO- LUMBAR THREE, LUMBAR THREE- LUMBAR FOUR;  Surgeon: Ashok Pall, MD;  Location: [place];  Service: Neurosurgery;  Laterality: N/A;  . nerve transfer surgery to lwft hand Left    Nov. 2017  . SHOULDER ARTHROSCOPY W/ ROTATOR CUFF REPAIR Left 13   rotator cuff   . SHOULDER INJECTION  June 2012   left, [place]  . SKIN CANCER EXCISION  07/1993   left lateral arm  . SUPERFICIAL KERATECTOMY Right 05/1998   wrist  . thumb injection  June 2012   left, [place]  . TONSILLECTOMY AND ADENOIDECTOMY    . TOTAL SHOULDER ARTHROPLASTY Left 12/25/2016   Procedure: TOTAL SHOULDER ARTHROPLASTY;  Surgeon: Marchia Bond, MD;  Location: [place];  Service: Orthopedics;  Laterality: Left;  . TOTAL SHOULDER ARTHROPLASTY Right 11/05/2017  .  TOTAL SHOULDER ARTHROPLASTY Right 11/05/2017   Procedure: TOTAL SHOULDER ARTHROPLASTY;  Surgeon: Marchia Bond, MD;  Location: [place];  Service: Orthopedics;  Laterality: Right;  . ulnar nerve transfer  12/1999   left  . ULNAR NERVE TRANSPOSITION Left     There were no vitals filed for this visit.  Subjective Assessment - 01/20/18 1017    Subjective  Pt. stated been using shoulder normally around the house over the weekend and feels "twinge" once in a while with certain motions. Carried full case of bottled water yesterday with no problems. Reported no issues with sleeping.     Pertinent History  R TSA 11/05/17    Patient Stated Goals  "I want my R shoudler as good as my L"    Currently  in Pain?  Yes    Pain Score  1     Pain Location  Shoulder    Pain Orientation  Right    Pain Descriptors / Indicators  Sore;Aching    Pain Type  Acute pain;Surgical pain         [facility] PT Assessment - 01/20/18 1125      Observation/Other Assessments   Focus on Therapeutic Outcomes (FOTO)   Shoulder; 65% (35% limited)            No data recorded       [facility] Adult PT Treatment/Exercise - 01/20/18 1017      Shoulder Exercises: Prone   Retraction  AROM;Right;15 reps    Retraction Limitations  row; leaning over green ball; unable to complete all reps sequentially due to cramping in R posterior arm/shoulder, relieved after STM    Extension  Right;AROM;15 reps    Extension Limitations  cues for scap retraction      Shoulder Exercises: Sidelying   External Rotation  Right;Strengthening;10 reps;Weights    External Rotation Weight (lbs)  1    External Rotation Limitations  "catching" noted in R posterior shoulder    ABduction  Right;Strengthening;Weights    ABduction Weight (lbs)  1    ABduction Limitations  20-90 dg      Shoulder Exercises: ROM/Strengthening   UBE (Upper Arm Bike)  L1.5 x 6'      Vasopneumatic   Number Minutes Vasopneumatic   15 minutes    Vasopnuematic Location   Shoulder    Vasopneumatic Pressure  Low    Vasopneumatic Temperature   coldest temp      Manual Therapy   Manual Therapy  Soft tissue mobilization    Manual therapy comments  patient supine    Soft tissue mobilization  STM to R posterior deltoid, R medial triceps, R posterior shoulder capsule, R teres group, R infraspinatus    Scapular Mobilization  R Scapula -  medial/inferior direction               PT Short Term Goals - 01/09/18 1031      PT SHORT TERM GOAL #1   Title  Patient to be independent with initial stretching/ROM HEP    Status  Achieved      PT SHORT TERM GOAL #2   Title  Patient to improve R shoulder PROM essentially equivalent or greater than L shoudler  without pain limiting     Status  On-going      PT SHORT TERM GOAL #3   Title  Patient to improve gross R shoulder strength to 3+/5    Status  On-going        PT Long Term Goals -  12/24/17 1405      PT LONG TERM GOAL #1   Title  Patient to be independent with advanced HEP for R shoulder strengthening    Status  On-going      PT LONG TERM GOAL #2   Title  Patient to improve R shoulder AROM essentially equivalent or greater than L shoudler without pain limiting     Status  On-going      PT LONG TERM GOAL #3   Title  Patient to demonstrate ability to perform ADLs and basic household chores without limitation due to R shoulder pain, LOM or weakness    Status  On-going      PT LONG TERM GOAL #4   Title  Patient to improve R shoulder strength to >/= 4-/5 to 4/5 for improved functinal use or R UE    Status  On-going            Plan - 01/20/18 1017    Clinical Impression Statement  Pt. experiencing low levels of pain, but twinges with certain movmeents. However, been able to progress to completing house hold activities. Pt. was reporting cramps/catching in posterior shoulder with scapular retraction exercises. Tightness noted in posterior deltoid, infraspinatus and teres group. STM to the R shoulder improved the patients symptoms. Ended session with vasopneumatic to reduce pain/edema.     PT Treatment/Interventions  Patient/family education;Neuromuscular re-education;ADLs/Self Care Home Management;Therapeutic exercise;Therapeutic activities;Manual techniques;Passive range of motion;Dry needling;Taping;Electrical Stimulation;Cryotherapy;Vasopneumatic Device;Moist Heat;Ultrasound;Iontophoresis 4mg /ml Dexamethasone    Consulted and Agree with Plan of Care  Patient       Patient will benefit from skilled therapeutic intervention in order to improve the following deficits and impairments:  Pain, Impaired UE functional use, Decreased strength, Decreased range of motion, Decreased activity  tolerance, Decreased endurance  Visit Diagnosis: Acute pain of right shoulder  Stiffness of right shoulder, not elsewhere classified  Muscle weakness (generalized)  Abnormal posture     Problem List Patient Active Problem List   Diagnosis Date Noted  . Primary localized osteoarthrosis of right shoulder 11/05/2017  . Primary localized osteoarthrosis of shoulder 11/05/2017  . Lumbar stenosis with neurogenic claudication 07/19/2017  . Primary localized osteoarthrosis of left shoulder 12/25/2016  . S/P shoulder replacement 12/25/2016  . Lumbar spondylolysis 12/17/2016  . Paresthesia 01/24/2016  . 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 extremity 03/29/2015  . Cervical spinal stenosis 02/04/2015  . Bronchiectasis without acute exacerbation ([place]) 01/11/2015  . Ulnar neuropathy at elbow 12/24/2014  . TIA (transient ischemic attack) 12/15/2014  . Hyperlipidemia 11/24/2014  . Hypoxia   . New onset a-fib ([place])   . MGUS (monoclonal gammopathy of unknown significance) 04/11/2012  . SEBORRHEIC KERATOSIS 11/09/2010  . CPK, ABNORMAL 09/03/2010  . DERMATOPHYTOSIS OF FOOT 08/03/2010  . MYALGIA 12/27/2009  . GOUT, UNSPECIFIED 10/04/2009  . HYPERTROPHY PROSTATE W/O UR OBST & OTH LUTS 12/14/2008  . ALLERGIC CONJUNCTIVITIS 12/03/2007  . TESTOSTERONE DEFICIENCY 11/14/2007  . Backache 04/22/2007  . HYPERLIPIDEMIA 12/26/2006  . OBESITY, NOS 12/26/2006  . 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 Hereditary and idiopathic peripheral neuropathy 12/26/2006  . HEARING LOSS NOS OR DEAFNESS 12/26/2006  . HYPERTENSION, BENIGN SYSTEMIC 12/26/2006  . OSTEOARTHRITIS, MULTI SITES 12/26/2006  . Musculoskeletal disorder and symptoms referable to neck 12/26/2006    Baldomero Lamy, SPT 01/20/2018, 1:18 PM  [facility] [address]  [place], [place], [address] Phone: [phone]   Fax:  [phone]  Name: Alaa Eyerman. MRN:  [id number] Date of Birth: 1935/11/16

## 2018-01-21 ENCOUNTER — Ambulatory Visit: Payer: Medicare Other | Admitting: Physical Therapy

## 2018-01-22 MED FILL — AMOXICILLIN 500 MG CAPSULE: 500 | 1 days supply | Qty: 4 | Fill #0

## 2018-01-23 ENCOUNTER — Encounter: Payer: Self-pay | Admitting: Physical Therapy

## 2018-01-23 ENCOUNTER — Ambulatory Visit: Payer: Medicare Other | Admitting: Physical Therapy

## 2018-01-23 DIAGNOSIS — M25611 Stiffness of right shoulder, not elsewhere classified: Secondary | ICD-10-CM

## 2018-01-23 DIAGNOSIS — M6281 Muscle weakness (generalized): Secondary | ICD-10-CM

## 2018-01-23 DIAGNOSIS — M25511 Pain in right shoulder: Secondary | ICD-10-CM | POA: Diagnosis not present

## 2018-01-23 DIAGNOSIS — R293 Abnormal posture: Secondary | ICD-10-CM

## 2018-01-23 NOTE — Therapy (Signed)
[facility] [address]  [place], [place], [address] Phone: [phone]   Fax:  [phone]  Physical Therapy Treatment  Patient Details  Name: Jose [name]. MRN: [id number] Date of Birth: 1935-11-15 Referring Provider: Dr. Marchia Bond, MD   Encounter Date: 01/23/2018  PT End of Session - 01/23/18 1015    Visit Number  11    Number of Visits  20    Date for PT Re-Evaluation  02/14/18    Authorization Type  Medicare & Tricare - PT only    PT Start Time  1015    PT Stop Time  1112    PT Time Calculation (min)  57 min    Activity Tolerance  Patient tolerated treatment well    Behavior During Therapy  [facility] for tasks assessed/performed       Past Medical History:  Diagnosis Date  . Arthritis   . Dysrhythmia    afib  . Gallstones 07/1996  . History of CT scan of head 1997   small vessel disease  . History of ETT 05/2002    no EKG changes  . History of MRI of cervical spine 07/15/2002  . Hypertension   . Lumbar spondylolysis 12/17/2016  . MGUS (monoclonal gammopathy of unknown significance) 04/11/2012  . Peripheral neuropathy   . Pneumonia 10/2014  . PONV (postoperative nausea and vomiting)    once many years ago  . Primary localized osteoarthrosis of left shoulder 12/25/2016  . Primary localized osteoarthrosis of right shoulder 11/05/2017  . Rectal bleeding 12/2002   colonoscopy  . Seizures ([place])    last seizure 1976  . Skin cancer   . Sleep apnea    uses CPAP  . Spinal stenosis, lumbar region with neurogenic claudication   . Stroke [facility])    ?tia ?afib  . TIA (transient ischemic attack)   . 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 ANTERIOR CERVICAL DECOMP/DISCECTOMY FUSION N/A 02/04/2015   Procedure: ANTERIOR CERVICAL DECOMPRESSION/DISCECTOMY FUSION CERVICAL FIVE-SIX,CERVICAL  SIX-SEVEN;  Surgeon: Karie Chimera, MD;  Location: [place] [facility];  Service: Neurosurgery;  Laterality: N/A;  . BACK SURGERY    . CATARACT EXTRACTION Bilateral   . COLONOSCOPY W/ POLYPECTOMY  04/19/2003   tubular adenoma  . EYE SURGERY     Bilateral Cataracts  . GREAT TOE ARTHRODESIS, INTERPHALANGEAL JOINT Left 1978  . JOINT REPLACEMENT    . joint replacement on right thumb Right   . LUMBAR LAMINECTOMY/DECOMPRESSION MICRODISCECTOMY N/A 07/19/2017   Procedure: LAMINECTOMY AND FORAMINOTOMY  LUMBAR TWO- LUMBAR THREE, LUMBAR THREE- LUMBAR FOUR;  Surgeon: Ashok Pall, MD;  Location: [place];  Service: Neurosurgery;  Laterality: N/A;  . nerve transfer surgery to lwft hand Left    Nov. 2017  . SHOULDER ARTHROSCOPY W/ ROTATOR CUFF REPAIR Left 13   rotator cuff   . SHOULDER INJECTION  June 2012   left, [place]  . SKIN CANCER EXCISION  07/1993   left lateral arm  . SUPERFICIAL KERATECTOMY Right 05/1998   wrist  . thumb injection  June 2012   left, [place]  . TONSILLECTOMY AND ADENOIDECTOMY    . TOTAL SHOULDER ARTHROPLASTY Left 12/25/2016   Procedure: TOTAL SHOULDER ARTHROPLASTY;  Surgeon: Marchia Bond, MD;  Location: [place];  Service: Orthopedics;  Laterality: Left;  . TOTAL SHOULDER ARTHROPLASTY Right 11/05/2017  .  TOTAL SHOULDER ARTHROPLASTY Right 11/05/2017   Procedure: TOTAL SHOULDER ARTHROPLASTY;  Surgeon: Marchia Bond, MD;  Location: [place];  Service: Orthopedics;  Laterality: Right;  . ulnar nerve transfer  12/1999   left  . ULNAR NERVE TRANSPOSITION Left     There were no vitals filed for this visit.  Subjective Assessment - 01/23/18 1019    Subjective  Pt reports he tried doubling up the red TB at home and felt increased soreness following this. Has since gone back to single red TB w/o issue, bit still noting some residual soreness.    Pertinent History  R TSA 11/05/17    Patient Stated Goals  "I want my R shoudler as good as my L"    Currently in Pain?  Yes    Pain Score  2     Pain  Location  Shoulder    Pain Orientation  Right    Pain Descriptors / Indicators  Sore    Pain Type  Chronic pain;Acute pain                No data recorded       [facility] Adult PT Treatment/Exercise - 01/23/18 1015      Exercises   Exercises  Shoulder      Shoulder Exercises: Sidelying   External Rotation  Right;10 reps    External Rotation Limitations  "catching/popping" on 1st 2 reps, then just mild soreness      Shoulder Exercises: Standing   External Rotation  Right;10 reps;Theraband;Strengthening    Theraband Level (Shoulder External Rotation)  Level 2 (Red)    Internal Rotation  Right;10 reps;Theraband;Strengthening    Theraband Level (Shoulder Internal Rotation)  Level 2 (Red)    Internal Rotation Limitations  pt noting slight "twinge" in posterior capusle on eccentric return to neutral    Extension  Both;20 reps;Theraband;Strengthening    Theraband Level (Shoulder Extension)  Level 2 (Red)    Row  Both;20 reps;Theraband;Strengthening    Theraband Level (Shoulder Row)  Level 2 (Red)      Shoulder Exercises: Therapy Ball   Flexion  Both;15 reps    Flexion Limitations  2# on R wrist for last 5 reps- orange ball on wall, pause for stretch at top of motion    Scaption  Right;5 reps    Scaption Limitations  1# on R wrist - orange ball on wall      Shoulder Exercises: ROM/Strengthening   UBE (Upper Arm Bike)  L2.0 x 6" (3/3)    Ball on Wall  R shoulder CW/CCW circles with orange Pball at 90 dg flexion, 1# on wrist x10 each      Modalities   Modalities  Vasopneumatic      Vasopneumatic   Number Minutes Vasopneumatic   15 minutes    Vasopnuematic Location   Shoulder    Vasopneumatic Pressure  Low    Vasopneumatic Temperature   coldest temp      Manual Therapy   Manual Therapy  Soft tissue mobilization;Myofascial release;Scapular mobilization    Manual therapy comments  pt L sidelying    Soft tissue mobilization  STM to R posterior shoulder capsule, R teres  group, R infraspinatus    Myofascial Release  manual TPR to R infraspinatus, teres major/minor    Scapular Mobilization  R Scapula -  medial/inferior direction               PT Short Term Goals - 01/09/18 1031      PT  SHORT TERM GOAL #1   Title  Patient to be independent with initial stretching/ROM HEP    Status  Achieved      PT SHORT TERM GOAL #2   Title  Patient to improve R shoulder PROM essentially equivalent or greater than L shoudler without pain limiting     Status  On-going      PT SHORT TERM GOAL #3   Title  Patient to improve gross R shoulder strength to 3+/5    Status  On-going        PT Long Term Goals - 12/24/17 1405      PT LONG TERM GOAL #1   Title  Patient to be independent with advanced HEP for R shoulder strengthening    Status  On-going      PT LONG TERM GOAL #2   Title  Patient to improve R shoulder AROM essentially equivalent or greater than L shoudler without pain limiting     Status  On-going      PT LONG TERM GOAL #3   Title  Patient to demonstrate ability to perform ADLs and basic household chores without limitation due to R shoulder pain, LOM or weakness    Status  On-going      PT LONG TERM GOAL #4   Title  Patient to improve R shoulder strength to >/= 4-/5 to 4/5 for improved functinal use or R UE    Status  On-going            Plan - 01/23/18 1100    Clinical Impression Statement  Jose [name] admitting to "overdoing things" with his HEP, trying to double up red TB with shoudler IR/ER, resulting in increased soreness. Has returned to just using single band but still noting some residual soreness with occasional popping/catching with some exercises. Tightness/increased tension noted in R posterior capsule which responded well to manual therapy which resolved popping/catching sensation. Still noting some soreness, therefore visit completed with vasopuematic compression to promote further pain reduction.    PT Treatment/Interventions   Patient/family education;Neuromuscular re-education;ADLs/Self Care Home Management;Therapeutic exercise;Therapeutic activities;Manual techniques;Passive range of motion;Dry needling;Taping;Electrical Stimulation;Cryotherapy;Vasopneumatic Device;Moist Heat;Ultrasound;Iontophoresis 4mg /ml Dexamethasone    Consulted and Agree with Plan of Care  Patient       Patient will benefit from skilled therapeutic intervention in order to improve the following deficits and impairments:  Pain, Impaired UE functional use, Decreased strength, Decreased range of motion, Decreased activity tolerance, Decreased endurance  Visit Diagnosis: Acute pain of right shoulder  Stiffness of right shoulder, not elsewhere classified  Muscle weakness (generalized)  Abnormal posture     Problem List Patient Active Problem List   Diagnosis Date Noted  . Primary localized osteoarthrosis of right shoulder 11/05/2017  . Primary localized osteoarthrosis of shoulder 11/05/2017  . Lumbar stenosis with neurogenic claudication 07/19/2017  . Primary localized osteoarthrosis of left shoulder 12/25/2016  . S/P shoulder replacement 12/25/2016  . Lumbar spondylolysis 12/17/2016  . Paresthesia 01/24/2016  . 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 extremity 03/29/2015  . Cervical spinal stenosis 02/04/2015  . Bronchiectasis without acute exacerbation ([place]) 01/11/2015  . Ulnar neuropathy at elbow 12/24/2014  . TIA (transient ischemic attack) 12/15/2014  . Hyperlipidemia 11/24/2014  . Hypoxia   . New onset a-fib ([place])   . MGUS (monoclonal gammopathy of unknown significance) 04/11/2012  . SEBORRHEIC KERATOSIS 11/09/2010  . CPK, ABNORMAL 09/03/2010  . DERMATOPHYTOSIS OF FOOT 08/03/2010  . MYALGIA 12/27/2009  . GOUT, UNSPECIFIED 10/04/2009  . HYPERTROPHY PROSTATE W/O UR  OBST & OTH LUTS 12/14/2008  . ALLERGIC CONJUNCTIVITIS 12/03/2007  . TESTOSTERONE DEFICIENCY 11/14/2007  . Backache 04/22/2007  . HYPERLIPIDEMIA 12/26/2006   . OBESITY, NOS 12/26/2006  . 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 Hereditary and idiopathic peripheral neuropathy 12/26/2006  . HEARING LOSS NOS OR DEAFNESS 12/26/2006  . HYPERTENSION, BENIGN SYSTEMIC 12/26/2006  . OSTEOARTHRITIS, MULTI SITES 12/26/2006  . Musculoskeletal disorder and symptoms referable to neck 12/26/2006    Percival Spanish, PT, MPT 01/23/2018, 11:54 AM  [facility] [address]  [place], [place], [address] Phone: [phone]   Fax:  [phone]  Name: Jose [name]. MRN: [id number] Date of Birth: Sep 14, 1936

## 2018-02-03 ENCOUNTER — Encounter: Payer: Self-pay | Admitting: Physical Therapy

## 2018-02-03 ENCOUNTER — Ambulatory Visit: Payer: Medicare Other | Attending: Orthopedic Surgery | Admitting: Physical Therapy

## 2018-02-03 DIAGNOSIS — R293 Abnormal posture: Secondary | ICD-10-CM | POA: Diagnosis present

## 2018-02-03 DIAGNOSIS — M25511 Pain in right shoulder: Secondary | ICD-10-CM | POA: Diagnosis present

## 2018-02-03 DIAGNOSIS — M6281 Muscle weakness (generalized): Secondary | ICD-10-CM

## 2018-02-03 DIAGNOSIS — M25611 Stiffness of right shoulder, not elsewhere classified: Secondary | ICD-10-CM

## 2018-02-03 NOTE — Therapy (Signed)
[facility] [address]  [place], [place], [address] Phone: [phone]   Fax:  [phone]  Physical Therapy Treatment  Patient Details  Name: Jose [name]. MRN: [id number] Date of Birth: May 10, 1936 Referring Provider: Dr. Marchia Bond, MD   Encounter Date: 02/03/2018  PT End of Session - 02/03/18 1101    Visit Number  12    Number of Visits  20    Date for PT Re-Evaluation  02/14/18    Authorization Type  Medicare & Tricare - PT only    PT Start Time  1101    PT Stop Time  1156    PT Time Calculation (min)  55 min    Activity Tolerance  Patient tolerated treatment well    Behavior During Therapy  [facility] for tasks assessed/performed       Past Medical History:  Diagnosis Date  . Arthritis   . Dysrhythmia    afib  . Gallstones 07/1996  . History of CT scan of head 1997   small vessel disease  . History of ETT 05/2002    no EKG changes  . History of MRI of cervical spine 07/15/2002  . Hypertension   . Lumbar spondylolysis 12/17/2016  . MGUS (monoclonal gammopathy of unknown significance) 04/11/2012  . Peripheral neuropathy   . Pneumonia 10/2014  . PONV (postoperative nausea and vomiting)    once many years ago  . Primary localized osteoarthrosis of left shoulder 12/25/2016  . Primary localized osteoarthrosis of right shoulder 11/05/2017  . Rectal bleeding 12/2002   colonoscopy  . Seizures ([place])    last seizure 1976  . Skin cancer   . Sleep apnea    uses CPAP  . Spinal stenosis, lumbar region with neurogenic claudication   . Stroke [facility])    ?tia ?afib  . TIA (transient ischemic attack)   . 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 ANTERIOR CERVICAL DECOMP/DISCECTOMY FUSION N/A 02/04/2015   Procedure: ANTERIOR CERVICAL DECOMPRESSION/DISCECTOMY FUSION CERVICAL FIVE-SIX,CERVICAL  SIX-SEVEN;  Surgeon: Karie Chimera, MD;  Location: [place] [facility];  Service: Neurosurgery;  Laterality: N/A;  . BACK SURGERY    . CATARACT EXTRACTION Bilateral   . COLONOSCOPY W/ POLYPECTOMY  04/19/2003   tubular adenoma  . EYE SURGERY     Bilateral Cataracts  . GREAT TOE ARTHRODESIS, INTERPHALANGEAL JOINT Left 1978  . JOINT REPLACEMENT    . joint replacement on right thumb Right   . LUMBAR LAMINECTOMY/DECOMPRESSION MICRODISCECTOMY N/A 07/19/2017   Procedure: LAMINECTOMY AND FORAMINOTOMY  LUMBAR TWO- LUMBAR THREE, LUMBAR THREE- LUMBAR FOUR;  Surgeon: Ashok Pall, MD;  Location: [place];  Service: Neurosurgery;  Laterality: N/A;  . nerve transfer surgery to lwft hand Left    Nov. 2017  . SHOULDER ARTHROSCOPY W/ ROTATOR CUFF REPAIR Left 13   rotator cuff   . SHOULDER INJECTION  June 2012   left, [place]  . SKIN CANCER EXCISION  07/1993   left lateral arm  . SUPERFICIAL KERATECTOMY Right 05/1998   wrist  . thumb injection  June 2012   left, [place]  . TONSILLECTOMY AND ADENOIDECTOMY    . TOTAL SHOULDER ARTHROPLASTY Left 12/25/2016   Procedure: TOTAL SHOULDER ARTHROPLASTY;  Surgeon: Marchia Bond, MD;  Location: [place];  Service: Orthopedics;  Laterality: Left;  . TOTAL SHOULDER ARTHROPLASTY Right 11/05/2017  .  TOTAL SHOULDER ARTHROPLASTY Right 11/05/2017   Procedure: TOTAL SHOULDER ARTHROPLASTY;  Surgeon: Marchia Bond, MD;  Location: [place];  Service: Orthopedics;  Laterality: Right;  . ulnar nerve transfer  12/1999   left  . ULNAR NERVE TRANSPOSITION Left     There were no vitals filed for this visit.  Subjective Assessment - 02/03/18 1101    Subjective  Pt. reported that he traveled to the beach and had to load/unload vehicle, and did it with minimal pain in the R shoulder. Not having pain with sleeping, however still having a pulling sensation in the R anterior shoulder upon waking.    Pertinent History  R TSA 11/05/17    Patient Stated Goals  "I want my R shoudler as good as my L"     Currently in Pain?  Yes    Pain Score  2     Pain Location  Shoulder    Pain Orientation  Right    Pain Descriptors / Indicators  Sore    Pain Type  Acute pain;Surgical pain                       [facility] Adult PT Treatment/Exercise - 02/03/18 1101      Shoulder Exercises: Standing   Horizontal ABduction  Both;10 reps;Theraband    Theraband Level (Shoulder Horizontal ABduction)  Level 2 (Red)    External Rotation  Right only completed 7 reps limited by fatigue    Theraband Level (Shoulder External Rotation)  Level 2 (Red)    Internal Rotation  Right;10 reps    Theraband Level (Shoulder Internal Rotation)  Level 2 (Red)    Other Standing Exercises  R Scaption w/ yellow TB x 15 reps      Shoulder Exercises: Therapy Ball   Flexion  Right;10 reps    Flexion Limitations  2# on R wrist w/ orange Pball on wall    Scaption  Right;10 reps    Scaption Limitations  1# on R wrist w/ orange Pball on wall       Shoulder Exercises: ROM/Strengthening   UBE (Upper Arm Bike)  L2.0 x 6' (3/3)       Vasopneumatic   Number Minutes Vasopneumatic   15 minutes    Vasopnuematic Location   Shoulder    Vasopneumatic Pressure  Low    Vasopneumatic Temperature   coldest temp      Manual Therapy   Manual Therapy  Soft tissue mobilization    Manual therapy comments  pt. supine    Soft tissue mobilization  STM to R Pec Major/Minor               PT Short Term Goals - 01/09/18 1031      PT SHORT TERM GOAL #1   Title  Patient to be independent with initial stretching/ROM HEP    Status  Achieved      PT SHORT TERM GOAL #2   Title  Patient to improve R shoulder PROM essentially equivalent or greater than L shoudler without pain limiting     Status  On-going      PT SHORT TERM GOAL #3   Title  Patient to improve gross R shoulder strength to 3+/5    Status  On-going        PT Long Term Goals - 12/24/17 1405      PT LONG TERM GOAL #1   Title  Patient to be independent with  advanced HEP for R shoulder  strengthening    Status  On-going      PT LONG TERM GOAL #2   Title  Patient to improve R shoulder AROM essentially equivalent or greater than L shoudler without pain limiting     Status  On-going      PT LONG TERM GOAL #3   Title  Patient to demonstrate ability to perform ADLs and basic household chores without limitation due to R shoulder pain, LOM or weakness    Status  On-going      PT LONG TERM GOAL #4   Title  Patient to improve R shoulder strength to >/= 4-/5 to 4/5 for improved functinal use or R UE    Status  On-going            Plan - 02/03/18 1101    Clinical Impression Statement  Raylyn reported that while on vacation he had to load/unload luggage and did it with minimal pain. Has had pain/soreness in R pec major region upon waking in the morning. Upon palpation pt. was TTP over R pec major with moderate tightness noted, however with STM loosened up and Pt. noted decrease in pain. Continued progression of therapeutic exercises and pt. tolerated well. Was limited by pain/fatigue with the eccentric return on resisted internal rotation. Clarified HEP to include strengthening of R IR/ER. Due to pain/soreness, session ended with vasopneumatic to R shoulder.     PT Treatment/Interventions  Patient/family education;Neuromuscular re-education;ADLs/Self Care Home Management;Therapeutic exercise;Therapeutic activities;Manual techniques;Passive range of motion;Dry needling;Taping;Electrical Stimulation;Cryotherapy;Vasopneumatic Device;Moist Heat;Ultrasound;Iontophoresis 4mg /ml Dexamethasone    Consulted and Agree with Plan of Care  Patient       Patient will benefit from skilled therapeutic intervention in order to improve the following deficits and impairments:  Pain, Impaired UE functional use, Decreased strength, Decreased range of motion, Decreased activity tolerance, Decreased endurance  Visit Diagnosis: Acute pain of right shoulder  Stiffness of  right shoulder, not elsewhere classified  Muscle weakness (generalized)  Abnormal posture     Problem List Patient Active Problem List   Diagnosis Date Noted  . Primary localized osteoarthrosis of right shoulder 11/05/2017  . Primary localized osteoarthrosis of shoulder 11/05/2017  . Lumbar stenosis with neurogenic claudication 07/19/2017  . Primary localized osteoarthrosis of left shoulder 12/25/2016  . S/P shoulder replacement 12/25/2016  . Lumbar spondylolysis 12/17/2016  . Paresthesia 01/24/2016  . 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 extremity 03/29/2015  . Cervical spinal stenosis 02/04/2015  . Bronchiectasis without acute exacerbation ([place]) 01/11/2015  . Ulnar neuropathy at elbow 12/24/2014  . TIA (transient ischemic attack) 12/15/2014  . Hyperlipidemia 11/24/2014  . Hypoxia   . New onset a-fib ([place])   . MGUS (monoclonal gammopathy of unknown significance) 04/11/2012  . SEBORRHEIC KERATOSIS 11/09/2010  . CPK, ABNORMAL 09/03/2010  . DERMATOPHYTOSIS OF FOOT 08/03/2010  . MYALGIA 12/27/2009  . GOUT, UNSPECIFIED 10/04/2009  . HYPERTROPHY PROSTATE W/O UR OBST & OTH LUTS 12/14/2008  . ALLERGIC CONJUNCTIVITIS 12/03/2007  . TESTOSTERONE DEFICIENCY 11/14/2007  . Backache 04/22/2007  . HYPERLIPIDEMIA 12/26/2006  . OBESITY, NOS 12/26/2006  . 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 Hereditary and idiopathic peripheral neuropathy 12/26/2006  . HEARING LOSS NOS OR DEAFNESS 12/26/2006  . HYPERTENSION, BENIGN SYSTEMIC 12/26/2006  . OSTEOARTHRITIS, MULTI SITES 12/26/2006  . Musculoskeletal disorder and symptoms referable to neck 12/26/2006    Baldomero Lamy, SPT 02/03/2018, 11:56 AM  [facility] [address]  [place], [place], [address] Phone: [phone]   Fax:  [phone]  Name: Linc Renne. MRN: [id number] Date of Birth: [birth year]

## 2018-02-06 ENCOUNTER — Ambulatory Visit: Payer: Medicare Other | Admitting: Physical Therapy

## 2018-02-06 ENCOUNTER — Encounter: Payer: Self-pay | Admitting: Physical Therapy

## 2018-02-06 DIAGNOSIS — M25611 Stiffness of right shoulder, not elsewhere classified: Secondary | ICD-10-CM

## 2018-02-06 DIAGNOSIS — M6281 Muscle weakness (generalized): Secondary | ICD-10-CM

## 2018-02-06 DIAGNOSIS — R293 Abnormal posture: Secondary | ICD-10-CM

## 2018-02-06 DIAGNOSIS — M25511 Pain in right shoulder: Secondary | ICD-10-CM

## 2018-02-06 NOTE — Therapy (Signed)
[facility] [address]  [place], [place], [address] Phone: [phone]   Fax:  [phone]  Physical Therapy Treatment  Patient Details  Name: Jose [name]. MRN: [id number] Date of Birth: September 11, 1936 Referring Provider: Dr. Marchia Bond, MD   Encounter Date: 02/06/2018  PT End of Session - 02/06/18 1450    Visit Number  13    Number of Visits  20    Date for PT Re-Evaluation  02/14/18    Authorization Type  Medicare & Tricare - PT only    PT Start Time  1450    PT Stop Time  1546    PT Time Calculation (min)  56 min    Activity Tolerance  Patient tolerated treatment well    Behavior During Therapy  [facility] for tasks assessed/performed       Past Medical History:  Diagnosis Date  . Arthritis   . Dysrhythmia    afib  . Gallstones 07/1996  . History of CT scan of head 1997   small vessel disease  . History of ETT 05/2002    no EKG changes  . History of MRI of cervical spine 07/15/2002  . Hypertension   . Lumbar spondylolysis 12/17/2016  . MGUS (monoclonal gammopathy of unknown significance) 04/11/2012  . Peripheral neuropathy   . Pneumonia 10/2014  . PONV (postoperative nausea and vomiting)    once many years ago  . Primary localized osteoarthrosis of left shoulder 12/25/2016  . Primary localized osteoarthrosis of right shoulder 11/05/2017  . Rectal bleeding 12/2002   colonoscopy  . Seizures ([place])    last seizure 1976  . Skin cancer   . Sleep apnea    uses CPAP  . Spinal stenosis, lumbar region with neurogenic claudication   . Stroke [facility])    ?tia ?afib  . TIA (transient ischemic attack)   . 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 ANTERIOR CERVICAL DECOMP/DISCECTOMY FUSION N/A 02/04/2015   Procedure: ANTERIOR CERVICAL DECOMPRESSION/DISCECTOMY FUSION CERVICAL FIVE-SIX,CERVICAL  SIX-SEVEN;  Surgeon: Karie Chimera, MD;  Location: [place] [facility];  Service: Neurosurgery;  Laterality: N/A;  . BACK SURGERY    . CATARACT EXTRACTION Bilateral   . COLONOSCOPY W/ POLYPECTOMY  04/19/2003   tubular adenoma  . EYE SURGERY     Bilateral Cataracts  . GREAT TOE ARTHRODESIS, INTERPHALANGEAL JOINT Left 1978  . JOINT REPLACEMENT    . joint replacement on right thumb Right   . LUMBAR LAMINECTOMY/DECOMPRESSION MICRODISCECTOMY N/A 07/19/2017   Procedure: LAMINECTOMY AND FORAMINOTOMY  LUMBAR TWO- LUMBAR THREE, LUMBAR THREE- LUMBAR FOUR;  Surgeon: Ashok Pall, MD;  Location: [place];  Service: Neurosurgery;  Laterality: N/A;  . nerve transfer surgery to lwft hand Left    Nov. 2017  . SHOULDER ARTHROSCOPY W/ ROTATOR CUFF REPAIR Left 13   rotator cuff   . SHOULDER INJECTION  June 2012   left, [place]  . SKIN CANCER EXCISION  07/1993   left lateral arm  . SUPERFICIAL KERATECTOMY Right 05/1998   wrist  . thumb injection  June 2012   left, [place]  . TONSILLECTOMY AND ADENOIDECTOMY    . TOTAL SHOULDER ARTHROPLASTY Left 12/25/2016   Procedure: TOTAL SHOULDER ARTHROPLASTY;  Surgeon: Marchia Bond, MD;  Location: [place];  Service: Orthopedics;  Laterality: Left;  . TOTAL SHOULDER ARTHROPLASTY Right 11/05/2017  .  TOTAL SHOULDER ARTHROPLASTY Right 11/05/2017   Procedure: TOTAL SHOULDER ARTHROPLASTY;  Surgeon: Marchia Bond, MD;  Location: [place];  Service: Orthopedics;  Laterality: Right;  . ulnar nerve transfer  12/1999   left  . ULNAR NERVE TRANSPOSITION Left     There were no vitals filed for this visit.  Subjective Assessment - 02/06/18 1459    Subjective  Pt reports resolution of anterior shoulder pain and now only notes soreness to touch over posterior arm. Pt now able to thread his belt through the belt loops on his pants for the first time in years.    Pertinent History  R TSA 11/05/17    Patient Stated Goals  "I want my R shoudler as good as my L"    Currently in Pain?  Yes    Pain  Score  0-No pain up to 3-4/10 on palpation    Pain Location  Arm    Pain Orientation  Right;Upper;Posterior    Pain Descriptors / Indicators  Sore                       [facility] Adult PT Treatment/Exercise - 02/06/18 1450      Exercises   Exercises  Shoulder      Shoulder Exercises: Seated   Other Seated Exercises  B scaption with 1# x10      Shoulder Exercises: Prone   Extension  Both;15 reps;Strengthening    Extension Limitations  I's standing with fwd lean over green Pball on mat table - cues to initiate motion with scapular retraction    Horizontal ABduction 1  Both;15 reps;Strengthening    Horizontal ABduction 1 Limitations  T's standing with fwd lean over green Pball on mat table - cues to initiate motion with scapular retraction    Horizontal ABduction 2  Both;10 reps;Strengthening    Horizontal ABduction 2 Limitations  Ys standing with fwd lean over green Pball on mat table - good scapular retraction but limited arm elevation      Shoulder Exercises: Standing   External Rotation  Right;10 reps;Theraband;Strengthening    Theraband Level (Shoulder External Rotation)  Level 2 (Red)    External Rotation Limitations  fatigue noted with effort but good mechanics    Internal Rotation  Right;10 reps    Theraband Level (Shoulder Internal Rotation)  Level 2 (Red)    Internal Rotation Limitations  pt noting slight "twinge" in posterior capusle/upper arm on eccentric return to neutral - appears to be a fatigue/weakness issue as pt noting improved from 1-2 wks ago      Shoulder Exercises: ROM/Strengthening   UBE (Upper Arm Bike)  L2.0 x 6' (3/3)       Modalities   Modalities  Vasopneumatic      Vasopneumatic   Number Minutes Vasopneumatic   15 minutes    Vasopnuematic Location   Shoulder    Vasopneumatic Pressure  Low    Vasopneumatic Temperature   coldest temp      Manual Therapy   Manual Therapy  Soft tissue mobilization;Myofascial release    Manual therapy  comments  pt. supine    Soft tissue mobilization  STM to R posterior shoulder complex & triceps    Myofascial Release  manual TPR to R triceps & teres major/minor               PT Short Term Goals - 02/06/18 1602      PT SHORT TERM GOAL #1   Title  Patient  to be independent with initial stretching/ROM HEP    Status  Achieved      PT SHORT TERM GOAL #2   Title  Patient to improve R shoulder PROM essentially equivalent or greater than L shoudler without pain limiting     Status  Achieved      PT SHORT TERM GOAL #3   Title  Patient to improve gross R shoulder strength to 3+/5    Status  Achieved        PT Long Term Goals - 12/24/17 1405      PT LONG TERM GOAL #1   Title  Patient to be independent with advanced HEP for R shoulder strengthening    Status  On-going      PT LONG TERM GOAL #2   Title  Patient to improve R shoulder AROM essentially equivalent or greater than L shoudler without pain limiting     Status  On-going      PT LONG TERM GOAL #3   Title  Patient to demonstrate ability to perform ADLs and basic household chores without limitation due to R shoulder pain, LOM or weakness    Status  On-going      PT LONG TERM GOAL #4   Title  Patient to improve R shoulder strength to >/= 4-/5 to 4/5 for improved functinal use or R UE    Status  On-going            Plan - 02/06/18 1546    Clinical Impression Statement  Pt reporting good relief of anterior shoulder soreness after manual therapy last visit, now only noting soreness in posterior upper arm with palpation or eccentric resisted IR. Mild increased muscle tension and ttp noted in proximal triceps as well as infraspinatus & teres minor - manual therapy including manual TPR performed to these muscles with twitch response noted in triceps and improved muscle tension following manual. R shoulder strength and functional use improving but pt still demonstrating limited control with eccentric IR and  concentric/eccentric ER.    PT Treatment/Interventions  Patient/family education;Neuromuscular re-education;ADLs/Self Care Home Management;Therapeutic exercise;Therapeutic activities;Manual techniques;Passive range of motion;Dry needling;Taping;Electrical Stimulation;Cryotherapy;Vasopneumatic Device;Moist Heat;Ultrasound;Iontophoresis 4mg /ml Dexamethasone    Consulted and Agree with Plan of Care  Patient       Patient will benefit from skilled therapeutic intervention in order to improve the following deficits and impairments:  Pain, Impaired UE functional use, Decreased strength, Decreased range of motion, Decreased activity tolerance, Decreased endurance  Visit Diagnosis: Acute pain of right shoulder  Stiffness of right shoulder, not elsewhere classified  Muscle weakness (generalized)  Abnormal posture     Problem List Patient Active Problem List   Diagnosis Date Noted  . Primary localized osteoarthrosis of right shoulder 11/05/2017  . Primary localized osteoarthrosis of shoulder 11/05/2017  . Lumbar stenosis with neurogenic claudication 07/19/2017  . Primary localized osteoarthrosis of left shoulder 12/25/2016  . S/P shoulder replacement 12/25/2016  . Lumbar spondylolysis 12/17/2016  . Paresthesia 01/24/2016  . 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 extremity 03/29/2015  . Cervical spinal stenosis 02/04/2015  . Bronchiectasis without acute exacerbation ([place]) 01/11/2015  . Ulnar neuropathy at elbow 12/24/2014  . TIA (transient ischemic attack) 12/15/2014  . Hyperlipidemia 11/24/2014  . Hypoxia   . New onset a-fib ([place])   . MGUS (monoclonal gammopathy of unknown significance) 04/11/2012  . SEBORRHEIC KERATOSIS 11/09/2010  . CPK, ABNORMAL 09/03/2010  . DERMATOPHYTOSIS OF FOOT 08/03/2010  . MYALGIA 12/27/2009  . GOUT, UNSPECIFIED 10/04/2009  .  HYPERTROPHY PROSTATE W/O UR OBST & OTH LUTS 12/14/2008  . ALLERGIC CONJUNCTIVITIS 12/03/2007  . TESTOSTERONE DEFICIENCY  11/14/2007  . Backache 04/22/2007  . HYPERLIPIDEMIA 12/26/2006  . OBESITY, NOS 12/26/2006  . 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 Hereditary and idiopathic peripheral neuropathy 12/26/2006  . HEARING LOSS NOS OR DEAFNESS 12/26/2006  . HYPERTENSION, BENIGN SYSTEMIC 12/26/2006  . OSTEOARTHRITIS, MULTI SITES 12/26/2006  . Musculoskeletal disorder and symptoms referable to neck 12/26/2006    Percival Spanish, PT, MPT 02/06/2018, 4:17 PM  [facility] [address]  [place], [place], [address] Phone: [phone]   Fax:  [phone]  Name: Jose [name]. MRN: [id number] Date of Birth: [birth year]

## 2018-02-11 ENCOUNTER — Encounter: Payer: Self-pay | Admitting: Physical Therapy

## 2018-02-11 ENCOUNTER — Ambulatory Visit: Payer: Medicare Other | Admitting: Physical Therapy

## 2018-02-11 DIAGNOSIS — M25511 Pain in right shoulder: Secondary | ICD-10-CM | POA: Diagnosis not present

## 2018-02-11 DIAGNOSIS — M6281 Muscle weakness (generalized): Secondary | ICD-10-CM

## 2018-02-11 DIAGNOSIS — M25611 Stiffness of right shoulder, not elsewhere classified: Secondary | ICD-10-CM

## 2018-02-11 DIAGNOSIS — R293 Abnormal posture: Secondary | ICD-10-CM

## 2018-02-11 NOTE — Therapy (Addendum)
[facility] [address]  [place], [place], [address] Phone: [phone]   Fax:  [phone]  Physical Therapy Treatment  Patient Details  Name: Jose [name]. MRN: [id number] Date of Birth: 08-06-1936 Referring Provider: Dr. Marchia Bond, MD   Encounter Date: 02/11/2018  PT End of Session - 02/11/18 1104    Visit Number  14    Number of Visits  20    Date for PT Re-Evaluation  02/14/18    Authorization Type  Medicare & Tricare - PT only    PT Start Time  1104    PT Stop Time  1204    PT Time Calculation (min)  60 min    Activity Tolerance  Patient tolerated treatment well    Behavior During Therapy  [facility] for tasks assessed/performed       Past Medical History:  Diagnosis Date  . Arthritis   . Dysrhythmia    afib  . Gallstones 07/1996  . History of CT scan of head 1997   small vessel disease  . History of ETT 05/2002    no EKG changes  . History of MRI of cervical spine 07/15/2002  . Hypertension   . Lumbar spondylolysis 12/17/2016  . MGUS (monoclonal gammopathy of unknown significance) 04/11/2012  . Peripheral neuropathy   . Pneumonia 10/2014  . PONV (postoperative nausea and vomiting)    once many years ago  . Primary localized osteoarthrosis of left shoulder 12/25/2016  . Primary localized osteoarthrosis of right shoulder 11/05/2017  . Rectal bleeding 12/2002   colonoscopy  . Seizures ([place])    last seizure 1976  . Skin cancer   . Sleep apnea    uses CPAP  . Spinal stenosis, lumbar region with neurogenic claudication   . Stroke [facility])    ?tia ?afib  . TIA (transient ischemic attack)   . 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 ANTERIOR CERVICAL DECOMP/DISCECTOMY FUSION N/A 02/04/2015   Procedure: ANTERIOR CERVICAL DECOMPRESSION/DISCECTOMY FUSION CERVICAL FIVE-SIX,CERVICAL  SIX-SEVEN;  Surgeon: Karie Chimera, MD;  Location: [place] [facility];  Service: Neurosurgery;  Laterality: N/A;  . BACK SURGERY    . CATARACT EXTRACTION Bilateral   . COLONOSCOPY W/ POLYPECTOMY  04/19/2003   tubular adenoma  . EYE SURGERY     Bilateral Cataracts  . GREAT TOE ARTHRODESIS, INTERPHALANGEAL JOINT Left 1978  . JOINT REPLACEMENT    . joint replacement on right thumb Right   . LUMBAR LAMINECTOMY/DECOMPRESSION MICRODISCECTOMY N/A 07/19/2017   Procedure: LAMINECTOMY AND FORAMINOTOMY  LUMBAR TWO- LUMBAR THREE, LUMBAR THREE- LUMBAR FOUR;  Surgeon: Ashok Pall, MD;  Location: [place];  Service: Neurosurgery;  Laterality: N/A;  . nerve transfer surgery to lwft hand Left    Nov. 2017  . SHOULDER ARTHROSCOPY W/ ROTATOR CUFF REPAIR Left 15   rotator cuff   . SHOULDER INJECTION  June 2012   left, [place]  . SKIN CANCER EXCISION  07/1993   left lateral arm  . SUPERFICIAL KERATECTOMY Right 05/1998   wrist  . thumb injection  June 2012   left, [place]  . TONSILLECTOMY AND ADENOIDECTOMY    . TOTAL SHOULDER ARTHROPLASTY Left 12/25/2016   Procedure: TOTAL SHOULDER ARTHROPLASTY;  Surgeon: Marchia Bond, MD;  Location: [place];  Service: Orthopedics;  Laterality: Left;  . TOTAL SHOULDER ARTHROPLASTY Right 11/05/2017  .  TOTAL SHOULDER ARTHROPLASTY Right 11/05/2017   Procedure: TOTAL SHOULDER ARTHROPLASTY;  Surgeon: Marchia Bond, MD;  Location: [place];  Service: Orthopedics;  Laterality: Right;  . ulnar nerve transfer  12/1999   left  . ULNAR NERVE TRANSPOSITION Left     There were no vitals filed for this visit.  Subjective Assessment - 02/11/18 1106    Subjective  Pt reports completing HEP 2x/day currently - no issues or pain, just muscle soreness after exercising. Has had no further sleep distrubances and was able to transition off egg crate pad within the past week.    Pertinent History  R TSA 11/05/17    Patient Stated Goals  "I want my R shoudler as good as my L"    Currently in Pain?  No/denies                        [facility] Adult PT Treatment/Exercise - 02/11/18 1104      Exercises   Exercises  Shoulder      Shoulder Exercises: Supine   Protraction  Right;15 reps;Weights;Strengthening    Protraction Weight (lbs)  4    Protraction Limitations  serratus punch    Flexion  Right;10 reps;Weights;Strengthening    Shoulder Flexion Weight (lbs)  4      Shoulder Exercises: Sidelying   External Rotation  Right;15 reps    External Rotation Limitations  towel roll under elbow to place shouder in slight abduction; verbal and tactil cues for scap retraction & avoiding shoulder hiking    ABduction  Right;15 reps    ABduction Limitations  20-90 dg; cues to avoid sholder hiking    Other Sidelying Exercises  R horizontal abduction x 10 reps - cues for emphasis on scap retraction upon initiation of motion      Shoulder Exercises: Standing   Flexion  Both;10 reps;Weights;Theraband    Theraband Level (Shoulder Flexion)  Level 1 (Yellow)    Shoulder Flexion Weight (lbs)  1    Flexion Limitations  1 set each with weights and band - cues to avoid shoulder hike    ABduction  Both;10 reps    Shoulder ABduction Weight (lbs)  1    ABduction Limitations  cues for scap retraction      Shoulder Exercises: ROM/Strengthening   UBE (Upper Arm Bike)  L2.5 x 6' (3/3)     Wall Pushups  10 reps    Rhythmic Stabilization, Supine  R shoulder flexion at 90 dg - 2 x 20 sec      Modalities   Modalities  Vasopneumatic      Vasopneumatic   Number Minutes Vasopneumatic   15 minutes    Vasopnuematic Location   Shoulder    Vasopneumatic Pressure  Low    Vasopneumatic Temperature   coldest temp      Manual Therapy   Manual Therapy  Soft tissue mobilization;Scapular mobilization    Manual therapy comments  pt supine & L sidellying    Soft tissue mobilization  STM to R posterior shoulder complex    Scapular Mobilization  R Scapula -  medial/inferior direction             PT Education -  02/11/18 1149    Education provided  Yes    Education Details  HEP update    Person(s) Educated  Patient    Methods  Explanation;Demonstration;Handout    Comprehension  Verbalized understanding;Returned demonstration       PT Short Term Goals -  02/06/18 1602      PT SHORT TERM GOAL #1   Title  Patient to be independent with initial stretching/ROM HEP    Status  Achieved      PT SHORT TERM GOAL #2   Title  Patient to improve R shoulder PROM essentially equivalent or greater than L shoudler without pain limiting     Status  Achieved      PT SHORT TERM GOAL #3   Title  Patient to improve gross R shoulder strength to 3+/5    Status  Achieved        PT Long Term Goals - 12/24/17 1405      PT LONG TERM GOAL #1   Title  Patient to be independent with advanced HEP for R shoulder strengthening    Status  On-going      PT LONG TERM GOAL #2   Title  Patient to improve R shoulder AROM essentially equivalent or greater than L shoudler without pain limiting     Status  On-going      PT LONG TERM GOAL #3   Title  Patient to demonstrate ability to perform ADLs and basic household chores without limitation due to R shoulder pain, LOM or weakness    Status  On-going      PT LONG TERM GOAL #4   Title  Patient to improve R shoulder strength to >/= 4-/5 to 4/5 for improved functinal use or R UE    Status  On-going            Plan - 02/11/18 1108    Clinical Impression Statement  Adithya reporting that he feels like the R shoulder is now equivalent to the L when it comes to motion, only lacks strength at this point. Only noting post-exercise muscle soreness presently, but otherwise denies pain. Has been able to wean from using egg crate cushion under arm while sleeping with no new pain or issues noted. Pt on track to complete POC and transition to HEP this week, therefore reviewed and updated HEP to ensure continued strengthening as indicated.    Rehab Potential  Good    PT  Treatment/Interventions  Patient/family education;Neuromuscular re-education;ADLs/Self Care Home Management;Therapeutic exercise;Therapeutic activities;Manual techniques;Passive range of motion;Dry needling;Taping;Electrical Stimulation;Cryotherapy;Vasopneumatic Device;Moist Heat;Ultrasound;Iontophoresis 4mg /ml Dexamethasone    Consulted and Agree with Plan of Care  Patient       Patient will benefit from skilled therapeutic intervention in order to improve the following deficits and impairments:  Pain, Impaired UE functional use, Decreased strength, Decreased range of motion, Decreased activity tolerance, Decreased endurance  Visit Diagnosis: Acute pain of right shoulder  Stiffness of right shoulder, not elsewhere classified  Muscle weakness (generalized)  Abnormal posture     Problem List Patient Active Problem List   Diagnosis Date Noted  . Primary localized osteoarthrosis of right shoulder 11/05/2017  . Primary localized osteoarthrosis of shoulder 11/05/2017  . Lumbar stenosis with neurogenic claudication 07/19/2017  . Primary localized osteoarthrosis of left shoulder 12/25/2016  . S/P shoulder replacement 12/25/2016  . Lumbar spondylolysis 12/17/2016  . Paresthesia 01/24/2016  . 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 extremity 03/29/2015  . Cervical spinal stenosis 02/04/2015  . Bronchiectasis without acute exacerbation ([place]) 01/11/2015  . Ulnar neuropathy at elbow 12/24/2014  . TIA (transient ischemic attack) 12/15/2014  . Hyperlipidemia 11/24/2014  . Hypoxia   . New onset a-fib ([place])   . MGUS (monoclonal gammopathy of unknown significance) 04/11/2012  . SEBORRHEIC KERATOSIS 11/09/2010  . CPK,  ABNORMAL 09/03/2010  . DERMATOPHYTOSIS OF FOOT 08/03/2010  . MYALGIA 12/27/2009  . GOUT, UNSPECIFIED 10/04/2009  . HYPERTROPHY PROSTATE W/O UR OBST & OTH LUTS 12/14/2008  . ALLERGIC CONJUNCTIVITIS 12/03/2007  . TESTOSTERONE DEFICIENCY 11/14/2007  . Backache 04/22/2007  .  HYPERLIPIDEMIA 12/26/2006  . OBESITY, NOS 12/26/2006  . 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 Hereditary and idiopathic peripheral neuropathy 12/26/2006  . HEARING LOSS NOS OR DEAFNESS 12/26/2006  . HYPERTENSION, BENIGN SYSTEMIC 12/26/2006  . OSTEOARTHRITIS, MULTI SITES 12/26/2006  . Musculoskeletal disorder and symptoms referable to neck 12/26/2006    Percival Spanish, PT, MPT 02/11/2018, 4:24 PM  [facility] [address]  [place], [place], [address] Phone: [phone]   Fax:  [phone]  Name: Jaqualyn Juday. MRN: [id number] Date of Birth: 14-Apr-1936

## 2018-02-14 ENCOUNTER — Ambulatory Visit: Payer: Medicare Other | Admitting: Physical Therapy

## 2018-02-14 ENCOUNTER — Encounter: Payer: Self-pay | Admitting: Physical Therapy

## 2018-02-14 DIAGNOSIS — R293 Abnormal posture: Secondary | ICD-10-CM

## 2018-02-14 DIAGNOSIS — M6281 Muscle weakness (generalized): Secondary | ICD-10-CM

## 2018-02-14 DIAGNOSIS — M25611 Stiffness of right shoulder, not elsewhere classified: Secondary | ICD-10-CM

## 2018-02-14 DIAGNOSIS — M25511 Pain in right shoulder: Secondary | ICD-10-CM

## 2018-02-14 NOTE — Therapy (Addendum)
Jose [name] [address]  Jose [name], [name], [place] [name] Phone: [phone]   Fax:  [phone]  Physical Therapy Treatment  Patient Details  Name: Jose [name]. MRN: [id number] Date of Birth: Sep 06, 1936 Referring Provider: Marchia Bond, MD   Encounter Date: 02/14/2018  PT End of Session - 02/14/18 1014    Visit Number  15    Number of Visits  20    Date for PT Re-Evaluation  02/14/18    Authorization Type  Medicare & [employer] - PT only    PT Start Time  1014    PT Stop Time  1059    PT Time Calculation (min)  45 min    Activity Tolerance  Patient tolerated treatment well    Behavior During Therapy  [facility] for tasks assessed/performed       Past Medical History:  Diagnosis Date  . Arthritis   . Dysrhythmia    afib  . Gallstones 07/1996  . History of CT scan of head 1997   small vessel disease  . History of ETT 05/2002    no EKG changes  . History of MRI of cervical spine 07/15/2002  . Hypertension   . Lumbar spondylolysis 12/17/2016  . MGUS (monoclonal gammopathy of unknown significance) 04/11/2012  . Peripheral neuropathy   . Pneumonia 10/2014  . PONV (postoperative nausea and vomiting)    once many years ago  . Primary localized osteoarthrosis of left shoulder 12/25/2016  . Primary localized osteoarthrosis of right shoulder 11/05/2017  . Rectal bleeding 12/2002   colonoscopy  . Seizures ([place])    last seizure 1976  . Skin cancer   . Sleep apnea    uses CPAP  . Spinal stenosis, lumbar region with neurogenic claudication   . Stroke [facility])    ?tia ?afib  . TIA (transient ischemic attack)   . 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 ANTERIOR CERVICAL DECOMP/DISCECTOMY FUSION N/A 02/04/2015   Procedure: ANTERIOR CERVICAL DECOMPRESSION/DISCECTOMY FUSION CERVICAL FIVE-SIX,CERVICAL  SIX-SEVEN;  Surgeon: Karie Chimera, MD;  Location: [place] NEURO ORS;  Service: Neurosurgery;  Laterality: N/A;  . BACK SURGERY    . CATARACT EXTRACTION Bilateral   . COLONOSCOPY W/ POLYPECTOMY  04/19/2003   tubular adenoma  . EYE SURGERY     Bilateral Cataracts  . GREAT TOE ARTHRODESIS, INTERPHALANGEAL JOINT Left 1978  . JOINT REPLACEMENT    . joint replacement on right thumb Right   . LUMBAR LAMINECTOMY/DECOMPRESSION MICRODISCECTOMY N/A 07/19/2017   Procedure: LAMINECTOMY AND FORAMINOTOMY  LUMBAR TWO- LUMBAR THREE, LUMBAR THREE- LUMBAR FOUR;  Surgeon: Ashok Pall, MD;  Location: [place];  Service: Neurosurgery;  Laterality: N/A;  . nerve transfer surgery to lwft hand Left    Nov. 2017  . SHOULDER ARTHROSCOPY W/ ROTATOR CUFF REPAIR Left 13   rotator cuff   . SHOULDER INJECTION  June 2012   left, [place]  . SKIN CANCER EXCISION  07/1993   left lateral arm  . SUPERFICIAL KERATECTOMY Right 05/1998   wrist  . thumb injection  June 2012   left, [place]  . TONSILLECTOMY AND ADENOIDECTOMY    . TOTAL SHOULDER ARTHROPLASTY Left 12/25/2016   Procedure: TOTAL SHOULDER ARTHROPLASTY;  Surgeon: Marchia Bond, MD;  Location: [place];  Service: Orthopedics;  Laterality: Left;  . TOTAL SHOULDER ARTHROPLASTY Right 11/05/2017  .  TOTAL SHOULDER ARTHROPLASTY Right 11/05/2017   Procedure: TOTAL SHOULDER ARTHROPLASTY;  Surgeon: Marchia Bond, MD;  Location: [place];  Service: Orthopedics;  Laterality: Right;  . ulnar nerve transfer  12/1999   left  . ULNAR NERVE TRANSPOSITION Left     There were no vitals filed for this visit.  Subjective Assessment - 02/14/18 1019    Subjective  Pt reporting some increased post-exercise muscle soreness following last therapy session which resolved the next day. Similar soreness noted when he overdoes things at home, but no lasting pain.    Pertinent History  R TSA 11/05/17    Patient Stated Goals  "I want my R shoudler as good as my L"    Currently in Pain?  No/denies          [facility] PT Assessment - 02/14/18 1014      Assessment   Medical Diagnosis  R TSA    Referring Provider  Marchia Bond, MD    Onset Date/Surgical Date  11/05/17      Observation/Other Assessments   Focus on Therapeutic Outcomes (FOTO)   Shoulder 70% (30% limited)      AROM   Right Shoulder Flexion  124 Degrees 141 supine    Right Shoulder ABduction  122 Degrees      PROM   Right Shoulder Flexion  141 Degrees    Right Shoulder ABduction  136 Degrees    Right Shoulder Internal Rotation  75 Degrees    Right Shoulder External Rotation  64 Degrees      Strength   Right Shoulder Flexion  4/5    Right Shoulder ABduction  4/5    Right Shoulder Internal Rotation  4+/5    Right Shoulder External Rotation  4-/5    Left Shoulder Flexion  4+/5    Left Shoulder ABduction  4+/5    Left Shoulder Internal Rotation  5/5    Left Shoulder External Rotation  4/5                   [facility] Adult PT Treatment/Exercise - 02/14/18 1014      Exercises   Exercises  Shoulder      Shoulder Exercises: ROM/Strengthening   UBE (Upper Arm Bike)  L3.0 x 6' (3/3)       Manual Therapy   Manual Therapy  Soft tissue mobilization;Myofascial release    Manual therapy comments  pt supine    Soft tissue mobilization  STM to R posterior shoulder complex    Myofascial Release  manual TPR to R teres major/minor               PT Short Term Goals - 02/06/18 1602      PT SHORT TERM GOAL #1   Title  Patient to be independent with initial stretching/ROM HEP    Status  Achieved      PT SHORT TERM GOAL #2   Title  Patient to improve R shoulder PROM essentially equivalent or greater than L shoudler without pain limiting     Status  Achieved      PT SHORT TERM GOAL #3   Title  Patient to improve gross R shoulder strength to 3+/5    Status  Achieved        PT Long Term Goals - 02/14/18 1017      PT LONG TERM GOAL #1   Title  Patient to be independent with advanced HEP for R  shoulder strengthening    Status  Achieved      PT LONG TERM GOAL #2   Title  Patient to improve R shoulder AROM essentially equivalent or greater than L shoudler without pain limiting     Status  Achieved      PT LONG TERM GOAL #3   Title  Patient to demonstrate ability to perform ADLs and basic household chores without limitation due to R shoulder pain, LOM or weakness    Status  Achieved      PT LONG TERM GOAL #4   Title  Patient to improve R shoulder strength to >/= 4-/5 to 4/5 for improved functional use or R UE    Status  Achieved            Plan - 02/14/18 1017    Clinical Impression Statement  Jose [name] is very pleased with his progress s/p R TSA - feeling like his motion is essentially equivalent to the L shoulder and only lacking strength in R shoulder at this [name]. No recent pain other than some post-exercise muscle soreness at times that resolves quickly. R shoulder MMT grossly 4-/5 to 4/5 as compared to 4/5 to 4+/5 on L. Pt reports no limitation in normal ADLs and typical household chores and feels comfortable transitioning to the HEP at this time for continued strengthening after brief clarification of some fo the newer exercises. WIll place pt on hold for 30 days in the event that issues arise with ongoing HEP.    Rehab Potential  Good    PT Treatment/Interventions  Patient/family education;Neuromuscular re-education;ADLs/Self Care Home Management;Therapeutic exercise;Therapeutic activities;Manual techniques;Passive range of motion;Dry needling;Taping;Electrical Stimulation;Cryotherapy;Vasopneumatic Device;Moist Heat;Ultrasound;Iontophoresis '4mg'$ /ml Dexamethasone    PT Next Visit Plan  30 day hold    Consulted and Agree with Plan of Care  Patient       Patient will benefit from skilled therapeutic intervention in order to improve the following deficits and impairments:  Pain, Impaired UE functional use, Decreased strength, Decreased range of motion, Decreased activity  tolerance, Decreased endurance  Visit Diagnosis: Acute pain of right shoulder  Stiffness of right shoulder, not elsewhere classified  Muscle weakness (generalized)  Abnormal posture     Problem List Patient Active Problem List   Diagnosis Date Noted  . Primary localized osteoarthrosis of right shoulder 11/05/2017  . Primary localized osteoarthrosis of shoulder 11/05/2017  . Lumbar stenosis with neurogenic claudication 07/19/2017  . Primary localized osteoarthrosis of left shoulder 12/25/2016  . S/P shoulder replacement 12/25/2016  . Lumbar spondylolysis 12/17/2016  . Paresthesia 01/24/2016  . 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 extremity 03/29/2015  . Cervical spinal stenosis 02/04/2015  . Bronchiectasis without acute exacerbation ([place]) 01/11/2015  . Ulnar neuropathy at elbow 12/24/2014  . TIA (transient ischemic attack) 12/15/2014  . Hyperlipidemia 11/24/2014  . Hypoxia   . New onset a-fib ([place])   . MGUS (monoclonal gammopathy of unknown significance) 04/11/2012  . SEBORRHEIC KERATOSIS 11/09/2010  . CPK, ABNORMAL 09/03/2010  . DERMATOPHYTOSIS OF FOOT 08/03/2010  . MYALGIA 12/27/2009  . GOUT, UNSPECIFIED 10/04/2009  . HYPERTROPHY PROSTATE W/O UR OBST & OTH LUTS 12/14/2008  . ALLERGIC CONJUNCTIVITIS 12/03/2007  . TESTOSTERONE DEFICIENCY 11/14/2007  . Backache 04/22/2007  . HYPERLIPIDEMIA 12/26/2006  . OBESITY, NOS 12/26/2006  . 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 Hereditary and idiopathic peripheral neuropathy 12/26/2006  . HEARING LOSS NOS OR DEAFNESS 12/26/2006  . HYPERTENSION, BENIGN SYSTEMIC 12/26/2006  . OSTEOARTHRITIS, MULTI SITES 12/26/2006  . Musculoskeletal disorder and symptoms referable to neck 12/26/2006    [facility]  Nolon Rod, PT, MPT 02/14/2018, 12:10 PM  [facility] [address]  [place], [name], [address] Phone: [phone]   Fax:  [phone]  Name: Grason Brailsford. MRN:  [id number] Date of Birth: 1936-02-18  PHYSICAL THERAPY DISCHARGE SUMMARY  Visits from Start of Care: 15  Current functional level related to goals / functional outcomes:   Refer to above clinical impression for status as of last visit on 02/14/18. Pt was placed on hold for 30 days and has not needed to return to PT, therefore will proceed with discharge from PT for this episode.   Remaining deficits:   As above.   Education / Equipment:   HEP  Plan: Patient agrees to discharge.  Patient goals were met. Patient is being discharged due to meeting the stated rehab goals.  ?????     Percival Spanish, PT, MPT 03/17/18, 1:17 PM  [facility] [address]  [place], [name], [address] Phone: [phone]   Fax:  [phone]

## 2018-03-14 MED FILL — LATANOPROST 0.005% EYE DRP: 0.005 | 50 days supply | Qty: 3 | Fill #1

## 2018-04-14 ENCOUNTER — Encounter (HOSPITAL_COMMUNITY): Payer: Self-pay | Admitting: Emergency Medicine

## 2018-04-14 ENCOUNTER — Emergency Department (HOSPITAL_BASED_OUTPATIENT_CLINIC_OR_DEPARTMENT_OTHER): Payer: Medicare Other

## 2018-04-14 ENCOUNTER — Observation Stay (HOSPITAL_COMMUNITY)
Admission: EM | Admit: 2018-04-14 | Discharge: 2018-04-16 | Disposition: A | Discharge status: Home / Self Care | Payer: Medicare Other | Attending: Cardiovascular Disease | Admitting: Cardiovascular Disease

## 2018-04-14 ENCOUNTER — Emergency Department (HOSPITAL_COMMUNITY): Payer: Medicare Other

## 2018-04-14 DIAGNOSIS — R072 Precordial pain: Secondary | ICD-10-CM | POA: Diagnosis not present

## 2018-04-14 DIAGNOSIS — Z8249 Family history of ischemic heart disease and other diseases of the circulatory system: Secondary | ICD-10-CM | POA: Insufficient documentation

## 2018-04-14 DIAGNOSIS — I5030 Unspecified diastolic (congestive) heart failure: Secondary | ICD-10-CM | POA: Diagnosis not present

## 2018-04-14 DIAGNOSIS — R9439 Abnormal result of other cardiovascular function study: Principal | ICD-10-CM

## 2018-04-14 DIAGNOSIS — I700 Atherosclerosis of aorta: Secondary | ICD-10-CM | POA: Insufficient documentation

## 2018-04-14 DIAGNOSIS — Z85828 Personal history of other malignant neoplasm of skin: Secondary | ICD-10-CM | POA: Diagnosis not present

## 2018-04-14 DIAGNOSIS — Z8673 Personal history of transient ischemic attack (TIA), and cerebral infarction without residual deficits: Secondary | ICD-10-CM | POA: Diagnosis not present

## 2018-04-14 DIAGNOSIS — Z96612 Presence of left artificial shoulder joint: Secondary | ICD-10-CM | POA: Diagnosis not present

## 2018-04-14 DIAGNOSIS — M48062 Spinal stenosis, lumbar region with neurogenic claudication: Secondary | ICD-10-CM | POA: Diagnosis not present

## 2018-04-14 DIAGNOSIS — Z981 Arthrodesis status: Secondary | ICD-10-CM | POA: Insufficient documentation

## 2018-04-14 DIAGNOSIS — Z96611 Presence of right artificial shoulder joint: Secondary | ICD-10-CM | POA: Diagnosis not present

## 2018-04-14 DIAGNOSIS — I420 Dilated cardiomyopathy: Secondary | ICD-10-CM | POA: Insufficient documentation

## 2018-04-14 DIAGNOSIS — G4733 Obstructive sleep apnea (adult) (pediatric): Secondary | ICD-10-CM | POA: Diagnosis not present

## 2018-04-14 DIAGNOSIS — Z87891 Personal history of nicotine dependence: Secondary | ICD-10-CM | POA: Diagnosis not present

## 2018-04-14 DIAGNOSIS — R001 Bradycardia, unspecified: Secondary | ICD-10-CM | POA: Insufficient documentation

## 2018-04-14 DIAGNOSIS — I25119 Atherosclerotic heart disease of native coronary artery with unspecified angina pectoris: Secondary | ICD-10-CM

## 2018-04-14 DIAGNOSIS — R079 Chest pain, unspecified: Secondary | ICD-10-CM | POA: Diagnosis not present

## 2018-04-14 DIAGNOSIS — I10 Essential (primary) hypertension: Secondary | ICD-10-CM | POA: Diagnosis present

## 2018-04-14 DIAGNOSIS — D472 Monoclonal gammopathy: Secondary | ICD-10-CM | POA: Diagnosis not present

## 2018-04-14 DIAGNOSIS — I25110 Atherosclerotic heart disease of native coronary artery with unstable angina pectoris: Secondary | ICD-10-CM | POA: Diagnosis not present

## 2018-04-14 DIAGNOSIS — Z9842 Cataract extraction status, left eye: Secondary | ICD-10-CM | POA: Insufficient documentation

## 2018-04-14 DIAGNOSIS — Z881 Allergy status to other antibiotic agents status: Secondary | ICD-10-CM | POA: Insufficient documentation

## 2018-04-14 DIAGNOSIS — I480 Paroxysmal atrial fibrillation: Secondary | ICD-10-CM | POA: Diagnosis not present

## 2018-04-14 DIAGNOSIS — Z888 Allergy status to other drugs, medicaments and biological substances status: Secondary | ICD-10-CM | POA: Insufficient documentation

## 2018-04-14 DIAGNOSIS — Z9889 Other specified postprocedural states: Secondary | ICD-10-CM | POA: Insufficient documentation

## 2018-04-14 DIAGNOSIS — M1990 Unspecified osteoarthritis, unspecified site: Secondary | ICD-10-CM | POA: Diagnosis not present

## 2018-04-14 DIAGNOSIS — I2510 Atherosclerotic heart disease of native coronary artery without angina pectoris: Secondary | ICD-10-CM

## 2018-04-14 DIAGNOSIS — Z96691 Finger-joint replacement of right hand: Secondary | ICD-10-CM | POA: Insufficient documentation

## 2018-04-14 DIAGNOSIS — Z7901 Long term (current) use of anticoagulants: Secondary | ICD-10-CM | POA: Diagnosis not present

## 2018-04-14 DIAGNOSIS — Z9841 Cataract extraction status, right eye: Secondary | ICD-10-CM | POA: Insufficient documentation

## 2018-04-14 DIAGNOSIS — E785 Hyperlipidemia, unspecified: Secondary | ICD-10-CM | POA: Insufficient documentation

## 2018-04-14 DIAGNOSIS — I110 Hypertensive heart disease with heart failure: Secondary | ICD-10-CM | POA: Diagnosis not present

## 2018-04-14 DIAGNOSIS — Z79899 Other long term (current) drug therapy: Secondary | ICD-10-CM | POA: Insufficient documentation

## 2018-04-14 DIAGNOSIS — G629 Polyneuropathy, unspecified: Secondary | ICD-10-CM | POA: Insufficient documentation

## 2018-04-14 HISTORY — DX: Atherosclerotic heart disease of native coronary artery without angina pectoris: I25.10

## 2018-04-14 LAB — COMPREHENSIVE METABOLIC PANEL
ALK PHOS: 58 U/L (ref 38–126)
ALT: 33 U/L (ref 17–63)
AST: 41 U/L (ref 15–41)
Albumin: 3.9 g/dL (ref 3.5–5.0)
Anion gap: 10 (ref 5–15)
BILIRUBIN TOTAL: 0.7 mg/dL (ref 0.3–1.2)
BUN: 19 mg/dL (ref 6–20)
CALCIUM: 8.8 mg/dL — AB (ref 8.9–10.3)
CO2: 21 mmol/L — ABNORMAL LOW (ref 22–32)
CREATININE: 0.94 mg/dL (ref 0.61–1.24)
Chloride: 111 mmol/L (ref 101–111)
Glucose, Bld: 116 mg/dL — ABNORMAL HIGH (ref 65–99)
Potassium: 3.5 mmol/L (ref 3.5–5.1)
Sodium: 142 mmol/L (ref 135–145)
Total Protein: 6.3 g/dL — ABNORMAL LOW (ref 6.5–8.1)

## 2018-04-14 LAB — CBC
HCT: 38.9 % — ABNORMAL LOW (ref 39.0–52.0)
HEMATOCRIT: 37.8 % — AB (ref 39.0–52.0)
Hemoglobin: 12.5 g/dL — ABNORMAL LOW (ref 13.0–17.0)
Hemoglobin: 12.3 g/dL — ABNORMAL LOW (ref 13.0–17.0)
MCH: 31.3 pg (ref 26.0–34.0)
MCH: 31 pg (ref 26.0–34.0)
MCHC: 32.1 g/dL (ref 30.0–36.0)
MCHC: 32.5 g/dL (ref 30.0–36.0)
MCV: 97.5 fL (ref 78.0–100.0)
MCV: 95.2 fL (ref 78.0–100.0)
Platelets: 197 10*3/uL (ref 150–400)
Platelets: 192 10*3/uL (ref 150–400)
RBC: 3.99 MIL/uL — AB (ref 4.22–5.81)
RBC: 3.97 MIL/uL — ABNORMAL LOW (ref 4.22–5.81)
RDW: 13.7 % (ref 11.5–15.5)
RDW: 13.5 % (ref 11.5–15.5)
WBC: 4.9 10*3/uL (ref 4.0–10.5)
WBC: 5.7 10*3/uL (ref 4.0–10.5)

## 2018-04-14 LAB — CBG MONITORING, ED: Glucose-Capillary: 124 mg/dL — ABNORMAL HIGH (ref 65–99)

## 2018-04-14 LAB — CREATININE, SERUM
Creatinine, Ser: 0.83 mg/dL (ref 0.61–1.24)
GFR calc Af Amer: >60 mL/min (ref >60)

## 2018-04-14 LAB — NM MYOCAR MULTI W/SPECT W/WALL MOTION / EF
CSEPED: 0 min
Estimated workload: 1 METS
Exercise duration (sec): 0 s
Peak HR: 82 {beats}/min
Rest HR: 50 {beats}/min

## 2018-04-14 LAB — TROPONIN I

## 2018-04-14 MED ORDER — AMLODIPINE BESYLATE 5 MG PO TABS
5.0000 mg | ORAL_TABLET | Freq: Every day | ORAL | Status: DC
  Administered 2018-04-14 – 2018-04-16 (×3): 5 mg via ORAL
  Filled 2018-04-14 (×3): qty 1

## 2018-04-14 MED ORDER — ENOXAPARIN SODIUM 40 MG/0.4ML ~~LOC~~ SOLN
40.0000 mg | SUBCUTANEOUS | Status: DC
  Administered 2018-04-14 – 2018-04-15 (×2): 40 mg via SUBCUTANEOUS
  Filled 2018-04-14 (×2): qty 0.4

## 2018-04-14 MED ORDER — SODIUM CHLORIDE 0.9% FLUSH
3.0000 mL | INTRAVENOUS | Status: DC | PRN
  Administered 2018-04-14: 3 mL via INTRAVENOUS
  Filled 2018-04-14: qty 3

## 2018-04-14 MED ORDER — NITROGLYCERIN 0.4 MG SL SUBL: 0.4000 mg | SUBLINGUAL_TABLET | SUBLINGUAL | Status: DC | PRN

## 2018-04-14 MED ORDER — REGADENOSON 0.4 MG/5ML IV SOLN
INTRAVENOUS | Status: AC
  Filled 2018-04-14: qty 5

## 2018-04-14 MED ORDER — TRAMADOL HCL 50 MG PO TABS: 50.0000 mg | ORAL_TABLET | Freq: Four times a day (QID) | ORAL | Status: DC | PRN

## 2018-04-14 MED ORDER — OXYCODONE HCL 5 MG PO TABS: 5.0000 mg | ORAL_TABLET | Freq: Four times a day (QID) | ORAL | Status: DC | PRN

## 2018-04-14 MED ORDER — TECHNETIUM TC 99M TETROFOSMIN IV KIT
30.0000 | PACK | Freq: Once | INTRAVENOUS | Status: AC | PRN
  Administered 2018-04-14: 30 via INTRAVENOUS

## 2018-04-14 MED ORDER — LATANOPROST 0.005 % OP SOLN
1.0000 [drp] | Freq: Every day | OPHTHALMIC | Status: DC
  Administered 2018-04-14 – 2018-04-15 (×2): 1 [drp] via OPHTHALMIC
  Filled 2018-04-14 (×2): qty 2.5

## 2018-04-14 MED ORDER — ONDANSETRON HCL 4 MG/2ML IJ SOLN: 4.0000 mg | Freq: Four times a day (QID) | INTRAMUSCULAR | Status: DC | PRN

## 2018-04-14 MED ORDER — ONDANSETRON HCL 4 MG PO TABS: 4.0000 mg | ORAL_TABLET | Freq: Three times a day (TID) | ORAL | Status: DC | PRN

## 2018-04-14 MED ORDER — OXYCODONE-ACETAMINOPHEN 10-325 MG PO TABS: 1.0000 | ORAL_TABLET | Freq: Four times a day (QID) | ORAL | Status: DC | PRN

## 2018-04-14 MED ORDER — TECHNETIUM TC 99M TETROFOSMIN IV KIT
10.0000 | PACK | Freq: Once | INTRAVENOUS | Status: AC | PRN
  Administered 2018-04-14: 10 via INTRAVENOUS

## 2018-04-14 MED ORDER — PROSIGHT PO TABS
1.0000 | ORAL_TABLET | Freq: Two times a day (BID) | ORAL | Status: DC
  Administered 2018-04-14 – 2018-04-16 (×4): 1 via ORAL
  Filled 2018-04-14 (×4): qty 1

## 2018-04-14 MED ORDER — SODIUM CHLORIDE 0.9 % WEIGHT BASED INFUSION
3.0000 mL/kg/h | INTRAVENOUS | Status: DC
  Administered 2018-04-15: 3 mL/kg/h via INTRAVENOUS

## 2018-04-14 MED ORDER — REGADENOSON 0.4 MG/5ML IV SOLN
0.4000 mg | Freq: Once | INTRAVENOUS | Status: AC
  Administered 2018-04-14: 0.4 mg via INTRAVENOUS
  Filled 2018-04-14: qty 5

## 2018-04-14 MED ORDER — SODIUM CHLORIDE 0.9% FLUSH: 3.0000 mL | Freq: Two times a day (BID) | INTRAVENOUS | Status: DC

## 2018-04-14 MED ORDER — OXYCODONE-ACETAMINOPHEN 5-325 MG PO TABS: 1.0000 | ORAL_TABLET | Freq: Four times a day (QID) | ORAL | Status: DC | PRN

## 2018-04-14 MED ORDER — SODIUM CHLORIDE 0.9 % IV SOLN: 250.0000 mL | INTRAVENOUS | Status: DC | PRN

## 2018-04-14 MED ORDER — OCUVITE-LUTEIN PO CAPS
1.0000 | ORAL_CAPSULE | Freq: Two times a day (BID) | ORAL | Status: DC
  Filled 2018-04-14: qty 1

## 2018-04-14 MED ORDER — ATORVASTATIN CALCIUM 80 MG PO TABS
80.0000 mg | ORAL_TABLET | Freq: Every day | ORAL | Status: DC
  Administered 2018-04-14 – 2018-04-15 (×2): 80 mg via ORAL
  Filled 2018-04-14 (×2): qty 1

## 2018-04-14 MED ORDER — ACETAMINOPHEN 325 MG PO TABS: 650.0000 mg | ORAL_TABLET | ORAL | Status: DC | PRN

## 2018-04-14 MED ORDER — ASPIRIN EC 81 MG PO TBEC
81.0000 mg | DELAYED_RELEASE_TABLET | Freq: Every day | ORAL | Status: DC
Start: 2018-04-15 — End: 2018-04-16
  Administered 2018-04-15 – 2018-04-16 (×2): 81 mg via ORAL
  Filled 2018-04-14 (×2): qty 1

## 2018-04-14 MED ORDER — SODIUM CHLORIDE 0.9 % WEIGHT BASED INFUSION: 1.0000 mL/kg/h | INTRAVENOUS | Status: DC

## 2018-04-14 NOTE — ED Triage Notes (Signed)
Per [facility] patient coming from home complaining left sided chest pain that he noticed when he woke up this morning. Patient denies any SOB, nausea, or radiation of the pain. Ekg resembled 1st degree heart block per ems with a rate of 46-60. 324 aspirin administered in route and 1 nitro that relieved the pain. Patient alert and oriented x4. Hx afib

## 2018-04-14 NOTE — ED Notes (Signed)
Pt transported to nuc med  

## 2018-04-14 NOTE — H&P (Addendum)
Cardiology Admission History and Physical:   Patient ID: Jose [name].; MRN: [id number]; DOB: 01/12/1936   Admission date: 04/14/2018  Primary Care Provider: Karlene Einstein, MD Primary Cardiologist: Dr. Atilano Median [facility], [facility] Primary Electrophysiologist:  None  Chief Complaint:  Chest pain  Patient Profile:   Jose [name]. is a 82 y.o. male with a history of HTN, HLD, paroxysmal atrial fibrillation on Eliquis, multiple TIAs, MGUS, and peripheral neuropathy, who presented with chest pain.   History of Present Illness:   Jose [name] was in his usual state of health until he awoke from sleep this morning around 6am with non-radiating left-sided chest discomfort which he describes as fluttering. He reports taking his pulse and it felt irregular. He took his BP at home which was 140s/90s (his norm). He was concerned because he had not felt this sensation in the past and EMS was activated. He was given 1 SL nitro en route to the ED which relieved his symptoms completely.   He follows with Dr. Atilano Median at [facility] in [place], and was last seen 12/2017 with complaints of orthostatic dizziness which occurred daily and though to be 2/2 low blood pressures. His HCTZ was discontinued at that visit to minimize orthostasis. He reports feeling well since that time until this morning. He has never a LHC before. Prior to today, his last stress test was ~1-2 years ago and reportedly normal, although limited by hip pain. He denies family history of heart disease. Risk factors for ACS include HTN, HLD, OSA on CPAP, and obesity.   At the time of this evaluation, he reports a dull burning sensation in the left side of his chest which is non-radiating. He states he has experienced this discomfort 2-3 times per week for the past 2-3 months and has not noticed any exacerbating or alleviating factors. He is limited in mobility by L hip pain and is planned for Hip replacement 06/2018. He denies  exertional chest pain, SOB, DOE, orthopnea, PND, or LE edema.   ED course: bradycardic, otherwise VSS. Labs notable for K 3.5, Cr 0.94, Hgb 12.5, PLT 197, Trop neg x2. EKG with sinus bradycardia with artifact vs atrial fibrillation with slow ventricular rate; no STE/D, no TWI. CXR with cardiomegaly and aortic atherosclerosis, otherwise benign. Patient underwent a NST today which revealed a medium defect of moderate severity concerning for ischemia; EF 53%. Cardiology asked to evaluate patient for ongoing care.    Past Medical History:  Diagnosis Date  . Arthritis   . Dysrhythmia    afib  . Gallstones 07/1996  . History of CT scan of head 1997   small vessel disease  . History of ETT 05/2002    no EKG changes  . History of MRI of cervical spine 07/15/2002  . Hypertension   . Lumbar spondylolysis 12/17/2016  . MGUS (monoclonal gammopathy of unknown significance) 04/11/2012  . Peripheral neuropathy   . Pneumonia 10/2014  . PONV (postoperative nausea and vomiting)    once many years ago  . Primary localized osteoarthrosis of left shoulder 12/25/2016  . Primary localized osteoarthrosis of right shoulder 11/05/2017  . Rectal bleeding 12/2002   colonoscopy  . Seizures ([place])    last seizure 1976  . Skin cancer   . Sleep apnea    uses CPAP  . Spinal stenosis, lumbar region with neurogenic claudication   . Stroke [facility])    ?tia ?afib  . TIA (transient ischemic attack)   .  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 ANTERIOR CERVICAL DECOMP/DISCECTOMY FUSION N/A 02/04/2015   Procedure: ANTERIOR CERVICAL DECOMPRESSION/DISCECTOMY FUSION CERVICAL FIVE-SIX,CERVICAL SIX-SEVEN;  Surgeon: Karie Chimera, MD;  Location: [place] [facility];  Service: Neurosurgery;  Laterality: N/A;  . BACK SURGERY    . CATARACT EXTRACTION Bilateral   . COLONOSCOPY W/ POLYPECTOMY  04/19/2003   tubular adenoma    . EYE SURGERY     Bilateral Cataracts  . GREAT TOE ARTHRODESIS, INTERPHALANGEAL JOINT Left 1978  . JOINT REPLACEMENT    . joint replacement on right thumb Right   . LUMBAR LAMINECTOMY/DECOMPRESSION MICRODISCECTOMY N/A 07/19/2017   Procedure: LAMINECTOMY AND FORAMINOTOMY  LUMBAR TWO- LUMBAR THREE, LUMBAR THREE- LUMBAR FOUR;  Surgeon: Ashok Pall, MD;  Location: [place];  Service: Neurosurgery;  Laterality: N/A;  . nerve transfer surgery to lwft hand Left    Nov. 2017  . SHOULDER ARTHROSCOPY W/ ROTATOR CUFF REPAIR Left 13   rotator cuff   . SHOULDER INJECTION  June 2012   left, [place]  . SKIN CANCER EXCISION  07/1993   left lateral arm  . SUPERFICIAL KERATECTOMY Right 05/1998   wrist  . thumb injection  June 2012   left, [place]  . TONSILLECTOMY AND ADENOIDECTOMY    . TOTAL SHOULDER ARTHROPLASTY Left 12/25/2016   Procedure: TOTAL SHOULDER ARTHROPLASTY;  Surgeon: Marchia Bond, MD;  Location: [place];  Service: Orthopedics;  Laterality: Left;  . TOTAL SHOULDER ARTHROPLASTY Right 11/05/2017  . TOTAL SHOULDER ARTHROPLASTY Right 11/05/2017   Procedure: TOTAL SHOULDER ARTHROPLASTY;  Surgeon: Marchia Bond, MD;  Location: [place];  Service: Orthopedics;  Laterality: Right;  . ulnar nerve transfer  12/1999   left  . ULNAR NERVE TRANSPOSITION Left      Medications Prior to Admission: Prior to Admission medications   Medication Sig Start Date End Date Taking? Authorizing Provider  amLODipine (NORVASC) 5 MG tablet Take 5 mg by mouth daily.   Yes [provider]  atorvastatin (LIPITOR) 10 MG tablet Take 10 mg by mouth daily.   Yes [provider]  baclofen (LIORESAL) 10 MG tablet Take 1 tablet (10 mg total) by mouth 3 (three) times daily. As needed for muscle spasm 12/25/16  Yes Marchia Bond, MD  ELIQUIS 5 MG TABS tablet TAKE 1 TABLET TWICE A DAY 03/26/17  Yes Kathrynn Ducking, MD  labetalol (NORMODYNE) 200 MG tablet Take 100 mg by mouth 2 (two) times daily.   Yes [provider]  latanoprost (XALATAN) 0.005 % ophthalmic solution Place 1 drop into the right eye at bedtime. 08/01/17  Yes [provider]  losartan (COZAAR) 50 MG tablet Take 50 mg by mouth daily. 03/30/18  Yes [provider]  meclizine (ANTIVERT) 25 MG tablet Take 1 tablet (25 mg total) by mouth 3 (three) times daily as needed. Patient taking differently: Take 25 mg by mouth 3 (three) times daily as needed for dizziness.  02/22/16  Yes Tanna Furry, MD  Multiple Vitamins-Minerals (PRESERVISION AREDS 2) CAPS Take 1 capsule by mouth 2 (two) times daily.    Yes [provider]  ondansetron (ZOFRAN) 4 MG tablet Take 1 tablet (4 mg total) by mouth every 8 (eight) hours as needed for nausea or vomiting. 12/25/16  Yes Marchia Bond, MD  oxyCODONE-acetaminophen (PERCOCET) 10-325 MG tablet Take 1-2 tablets by mouth every 6 (six) hours as needed  for pain. MAXIMUM TOTAL ACETAMINOPHEN DOSE IS 4000 MG PER DAY 11/05/17  Yes Marchia Bond, MD  traMADol (ULTRAM) 50 MG tablet Take 1 tablet (50 mg total) by mouth every 6 (six) hours as needed. 01/24/16  Yes Kathrynn Ducking, MD  dextromethorphan-guaiFENesin [facility] DM) 30-600 MG 12hr tablet Take 1 tablet by mouth 2 (two) times daily. Patient not taking: Reported on 04/14/2018 11/07/17   Fredia Sorrow, MD  gabapentin (NEURONTIN) 300 MG capsule TAKE 1 CAPSULE TWICE DURING THE DAY AND TAKE 2 CAPSULES AT NIGHT Patient not taking: Reported on 04/14/2018 10/03/17   Kathrynn Ducking, MD  sennosides-docusate sodium (SENOKOT-S) 8.6-50 MG tablet Take 2 tablets by mouth daily. Patient not taking: Reported on 04/14/2018 12/25/16   Marchia Bond, MD     Allergies:    Allergies  Allergen Reactions  . Maprotiline     TETRACYCLIC ANTIDEPRESSANTS UNSPECIFIED REACTION (patient is unaware of allergy)  . Tetracycline Hcl Itching and Rash    Rash on hands    Social History:   Social History   Socioeconomic History  . Marital status: Married     Spouse name: [name]  . Number of children: 2  . Years of education: 43  . Highest education level: Not on file  Occupational History  . Occupation: retired    Fish farm manager: UNEMPLOYED  Social Needs  . Financial resource strain: Not on file  . Food insecurity:    Worry: Not on file    Inability: Not on file  . Transportation needs:    Medical: Not on file    Non-medical: Not on file  Tobacco Use  . Smoking status: Former Smoker    Packs/day: 2.00    Years: 13.00    Pack years: 26.00    Types: Cigarettes    Last attempt to quit: 03/05/1968    Years since quitting: 50.1  . Smokeless tobacco: Never Used  Substance and Sexual Activity  . Alcohol use: Yes    Alcohol/week: 1.2 oz    Types: 2 Glasses of wine per week  . Drug use: No  . Sexual activity: Not Currently  Lifestyle  . Physical activity:    Days per week: Not on file    Minutes per session: Not on file  . Stress: Not on file  Relationships  . Social connections:    Talks on phone: Not on file    Gets together: Not on file    Attends religious service: Not on file    Active member of club or organization: Not on file    Attends meetings of clubs or organizations: Not on file    Relationship status: Not on file  . Intimate partner violence:    Fear of current or ex partner: Not on file    Emotionally abused: Not on file    Physically abused: Not on file    Forced sexual activity: Not on file  Other Topics Concern  . Not on file  Social History Narrative   Lives with 4th wife, [name], married 2010   Retired, Physicist, medical   Son, [place], [name], 2 children   Daughter, [place], in [place], 2 children   Patient is right handed   Patient drinks approximately 3-4 cups of caffeine daily    Family History:   The patient's family history includes Dementia in his father; Dementia (age of onset: 31) in his mother; Hypertension in his sister; Transient ischemic attack  in his mother.  There is no history of Seizures.    ROS:  Please see the history of present illness.  All other ROS reviewed and negative.     Physical Exam/Data:   Vitals:   04/14/18 1315 04/14/18 1317 04/14/18 1330 04/14/18 1345  BP: (!) 133/92 132/89    Pulse:   (!) 59 (!) 59  Resp:   14 17  Temp:      TempSrc:      SpO2:   98% 97%  Weight:      Height:       No intake or output data in the 24 hours ending 04/14/18 1603 Filed Weights   04/14/18 0746  Weight: 225 lb (102.1 kg)   Body mass index is 29.69 kg/m.  General:  Well nourished, well developed, sitting upright in bed in no acute distress HEENT: sclera anicteric, MMM Neck: no JVD Vascular: No carotid bruits; distal pulses 2+ bilaterally Cardiac:  normal S1, S2; RRR; no murmurs, gallops, or rubs; no chest wall TTP Lungs:  clear to auscultation bilaterally, no wheezing, rhonchi or rales  Abd: soft, nontender, no hepatomegaly  Ext: trace LE edema Musculoskeletal:  No deformities, BUE and BLE strength normal and equal Skin: warm and dry  Neuro:  CNs 2-12 intact, no focal abnormalities noted Psych:  Normal affect    EKG:  sinus bradycardia with artifact vs atrial fibrillation with slow ventricular rate; no STE/D, no TWI.  Relevant CV Studies: Echocardiogram 2015: Study Conclusions  - Left ventricle: The cavity size was normal. Systolic function was normal. The estimated ejection fraction was in the range of 50% to 55%. Wall motion was normal; there were no regional wall motion abnormalities. Left ventricular diastolic function parameters were normal.  NST 04/14/18:  There was no ST segment deviation noted during stress.  Defect 1: There is a medium defect of moderate severity.  Findings consistent with ischemia.  This is an intermediate risk study.  Nuclear stress EF: 53%.  No T wave inversion was noted during stress.   Medium size, moderate severity partially reversible inferoseptal and  apical perfusion defect (SDS 5), suggestive of ischemia. LVEF 53%. Borderline elevated TID ratio of 1.2. This is an intermediate risk study. Clinical correlation is advised.  Laboratory Data:  Chemistry Recent Labs  Lab 04/14/18 0820  NA 142  K 3.5  CL 111  CO2 21*  GLUCOSE 116*  BUN 19  CREATININE 0.94  CALCIUM 8.8*  GFRNONAA >60  GFRAA >60  ANIONGAP 10    Recent Labs  Lab 04/14/18 0820  PROT 6.3*  ALBUMIN 3.9  AST 41  ALT 33  ALKPHOS 58  BILITOT 0.7   Hematology Recent Labs  Lab 04/14/18 0820  WBC 4.9  RBC 3.99*  HGB 12.5*  HCT 38.9*  MCV 97.5  MCH 31.3  MCHC 32.1  RDW 13.7  PLT 197   Cardiac Enzymes Recent Labs  Lab 04/14/18 0820 04/14/18 1121  TROPONINI <0.03 <0.03   No results for input(s): TROPIPOC in the last 168 hours.  BNPNo results for input(s): BNP, PROBNP in the last 168 hours.  DDimer No results for input(s): DDIMER in the last 168 hours.  Radiology/Studies:  Dg Chest 2 View  Result Date: 04/14/2018 CLINICAL DATA:  82 year old male with rapid heart rate. History of atrial fibrillation. Initial encounter. EXAM: CHEST - 2 VIEW COMPARISON:  11/07/2017, 11/01/2015 and 11/24/2014 chest x-ray. FINDINGS: Slightly elevated left hemidiaphragm as noted on prior exams. Cardiomegaly. No infiltrate, congestive heart failure or pneumothorax. Calcified  minimally tortuous aorta. Prior bilateral shoulder replacements and lower cervical spine surgery. IMPRESSION: Cardiomegaly. No infiltrate or congestive heart failure. Aortic Atherosclerosis (ICD10-I70.0). Electronically Signed   By: Genia Del M.D.   On: 04/14/2018 08:37   Nm Myocar Multi W/spect W/wall Motion / Ef  Result Date: 04/14/2018  There was no ST segment deviation noted during stress.  Defect 1: There is a medium defect of moderate severity.  Findings consistent with ischemia.  This is an intermediate risk study.  Nuclear stress EF: 53%.  No T wave inversion was noted during stress.  Medium  size, moderate severity partially reversible inferoseptal and apical perfusion defect (SDS 5), suggestive of ischemia. LVEF 53%. Borderline elevated TID ratio of 1.2. This is an intermediate risk study. Clinical correlation is advised.    Assessment and Plan:   1. Abnormal stress test in patient who presented with chest pain: patient awoken from sleep this morning around 6am with chest discomfort he described as a fluttering sensation. He reports immediate relief of symptoms with 1 SL nitro given by EMS en route to the ED. Trop negative x2. EKG without ischemic changes. Patient underwent NST today which revealed a partially reversible defect in the inferoseptal and apical region suggestive of ischemia. Last dose of eliquis was 04/13/18 PM dose.  - Will plan for [facility] tomorrow for further ischemic evaluation - Will check Lipid panel and A1C in AM for risk stratification/management  2. Paroxysmal atrial fibrillation: appears to be in sinus bradycardia with artifact vs atrial fibrillation with slow ventricular rate on telemetry - Hold eliquis for upcoming LHC - can resume post cath - Will hold labetalol given bradycardia to the 40s-50s.   3. HTN: BP stable  - Continue home amlodipine - Will hold home losartan in anticipation of LHC to minimize kidney burden - can resume post cath if Cr stable  4. HLD: no recent lipids - Will check FLP in AM - Continue statin  5. Peripheral neuropathy: s/p multiple spinal surgeries - Continue home gabapentin and tramadol  Severity of Illness: The appropriate patient status for this patient is OBSERVATION. Observation status is judged to be reasonable and necessary in order to provide the required intensity of service to ensure the patient's safety. The patient's presenting symptoms, physical exam findings, and initial radiographic and laboratory data in the context of their medical condition is felt to place them at decreased risk for further clinical  deterioration. Furthermore, it is anticipated that the patient will be medically stable for discharge from the hospital within 2 midnights of admission. The following factors support the patient status of observation.   " The patient's presenting symptoms include chest discomfort. " The physical exam findings include normal cardiac exam, trace LE edema. " The initial radiographic and laboratory data are abnormal stress test.     For questions or updates, please contact [facility] Please consult [email or web address] for contact info under Cardiology/STEMI.    Signed, Abigail Butts, PA-C  04/14/2018 4:03 PM   I have personally seen and examined this patient. I agree with the assessment and plan as outlined above.  He has a h/o HTN, HLD, OSA and evidence of aortic atherosclerosis. No known CAD. Also has PAF.  His chest pain is atypical and really is more of a pressure with fluttering.  ED staff arranged a nuclear stress test which is abnormal and suggestive of ischemia.  Troponin negative. EKG without ischemic changes. REviewed by me.  My exam:  General: Well developed, well nourished,  NAD  HEENT: OP clear, mucus membranes moist  SKIN: warm, dry. No rashes. Neuro: No focal deficits  Musculoskeletal: Muscle strength 5/5 all ext  Psychiatric: Mood and affect normal  Neck: No JVD, no carotid bruits, no thyromegaly, no lymphadenopathy.  Lungs:Clear bilaterally, no wheezes, rhonci, crackles Cardiovascular: Regular rate and rhythm. No murmurs, gallops or rubs. Abdomen:Soft. Bowel sounds present. Non-tender.  Extremities: No lower extremity edema. Pulses are 2 + in the bilateral DP/PT.  Plan:  Chest pain with abnormal stress test.  Will plan cardiac cath tomorrow after lunch. Last dose of Eliquis was last night.   I have reviewed the risks, indications, and alternatives to cardiac catheterization, possible angioplasty, and stenting with the patient. Risks include but are not limited to  bleeding, infection, vascular injury, stroke, myocardial infection, arrhythmia, kidney injury, radiation-related injury in the case of prolonged fluoroscopy use, emergency cardiac surgery, and death. The patient understands the risks of serious complication is 1-2 in 0352 with diagnostic cardiac cath and 1-2% or less with angioplasty/stenting. He will be admitted to the telemetry unit.   Lauree Chandler  04/14/2018 5:29 PM

## 2018-04-14 NOTE — ED Provider Notes (Signed)
[facility] EMERGENCY DEPARTMENT Provider Note   CSN: [id number] Arrival date & time: 04/14/18  0741     History   Chief Complaint Chief Complaint  Patient presents with  . Chest Pain    HPI Jose [name]. is a 82 y.o. male with a h/o of paroxysmal Afib on Eliquis, multiple TIAs, MGUS, peripheral neuropathy, dyslipidemia, and HTN who presents to the emergency department with a chief complaint of chest pain.  The patient reports he awoke at 6 AM with non-radiating left sided chest fluttering.  Pain is constant.  He reports that he checked his pulse, which he states that at first he had a difficult time palpating and when he was able to feel it felt that it was irregular with several rapid heartbeat followed by a few slow beats.  He took his blood pressure with his home cuff and received a reading of 147/90.  He rechecked his pulse and noted that it continued to feel irregular so he called 911.  He was given 325 mg of ASA and 1 sublingual nitro.  He reports the pain resolved almost immediately after administration of nitroglycerin.  He was seen by his cardiologist in March 2019 and losartan-HCTZ twice daily was changed to losartan 50 mg once daily.  He also stopped taking gabapentin several months ago, but no other recent medication changes.  He drinks 1 cup of coffee a day.  He had 2 alcoholic drinks 3 nights ago for special occasion, but typically does not drink.  He has been a non-smoker since 1969.  No IV recreational drug use.  He denies dyspnea, back pain, fever, chills, leg swelling, cough, URI symptoms, abdominal pain, nausea, vomiting, diarrhea, dizziness, lightheadedness, neck pain, arm pain, or visual changes.  He has not missed any doses of his home medications, but has not taken his morning medications.  No history of CAD. Cardiologist: Dr. Atilano Median at [facility].  Negative stress test in January 2019. ECHO in 12/15 with EF of 50-55% with no wall motion.   The  history is provided by the patient. No language interpreter was used.  Chest Pain   Associated symptoms include palpitations. Pertinent negatives include no abdominal pain, no back pain, no cough, no diaphoresis, no dizziness, no fever, no headaches, no nausea, no shortness of breath, no vomiting and no weakness.    Past Medical History:  Diagnosis Date  . Arthritis   . Dysrhythmia    afib  . Gallstones 07/1996  . History of CT scan of head 1997   small vessel disease  . History of ETT 05/2002    no EKG changes  . History of MRI of cervical spine 07/15/2002  . Hypertension   . Lumbar spondylolysis 12/17/2016  . MGUS (monoclonal gammopathy of unknown significance) 04/11/2012  . Peripheral neuropathy   . Pneumonia 10/2014  . PONV (postoperative nausea and vomiting)    once many years ago  . Primary localized osteoarthrosis of left shoulder 12/25/2016  . Primary localized osteoarthrosis of right shoulder 11/05/2017  . Rectal bleeding 12/2002   colonoscopy  . Seizures ([place])    last seizure 1976  . Skin cancer   . Sleep apnea    uses CPAP  . Spinal stenosis, lumbar region with neurogenic claudication   . Stroke [facility])    ?tia ?afib  . TIA (transient ischemic attack)   . 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 extremity 03/29/2015  Patient Active Problem List   Diagnosis Date Noted  . Primary localized osteoarthrosis of right shoulder 11/05/2017  . Primary localized osteoarthrosis of shoulder 11/05/2017  . Lumbar stenosis with neurogenic claudication 07/19/2017  . Primary localized osteoarthrosis of left shoulder 12/25/2016  . S/P shoulder replacement 12/25/2016  . Lumbar spondylolysis 12/17/2016  . Paresthesia 01/24/2016  . 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 extremity 03/29/2015  . Cervical spinal stenosis 02/04/2015  . Bronchiectasis without acute exacerbation ([place]) 01/11/2015  . Ulnar neuropathy at elbow 12/24/2014  . TIA  (transient ischemic attack) 12/15/2014  . Hyperlipidemia 11/24/2014  . Hypoxia   . New onset a-fib ([place])   . MGUS (monoclonal gammopathy of unknown significance) 04/11/2012  . SEBORRHEIC KERATOSIS 11/09/2010  . CPK, ABNORMAL 09/03/2010  . DERMATOPHYTOSIS OF FOOT 08/03/2010  . MYALGIA 12/27/2009  . GOUT, UNSPECIFIED 10/04/2009  . HYPERTROPHY PROSTATE W/O UR OBST & OTH LUTS 12/14/2008  . ALLERGIC CONJUNCTIVITIS 12/03/2007  . TESTOSTERONE DEFICIENCY 11/14/2007  . Backache 04/22/2007  . HYPERLIPIDEMIA 12/26/2006  . OBESITY, NOS 12/26/2006  . 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 Hereditary and idiopathic peripheral neuropathy 12/26/2006  . HEARING LOSS NOS OR DEAFNESS 12/26/2006  . HYPERTENSION, BENIGN SYSTEMIC 12/26/2006  . OSTEOARTHRITIS, MULTI SITES 12/26/2006  . Musculoskeletal disorder and symptoms referable to neck 12/26/2006    Past Surgical History:  Procedure Laterality Date  . ANTERIOR CERVICAL DECOMP/DISCECTOMY FUSION N/A 02/04/2015   Procedure: ANTERIOR CERVICAL DECOMPRESSION/DISCECTOMY FUSION CERVICAL FIVE-SIX,CERVICAL SIX-SEVEN;  Surgeon: Karie Chimera, MD;  Location: [facility];  Service: Neurosurgery;  Laterality: N/A;  . BACK SURGERY    . CATARACT EXTRACTION Bilateral   . COLONOSCOPY W/ POLYPECTOMY  04/19/2003   tubular adenoma  . EYE SURGERY     Bilateral Cataracts  . GREAT TOE ARTHRODESIS, INTERPHALANGEAL JOINT Left 1978  . JOINT REPLACEMENT    . joint replacement on right thumb Right   . LUMBAR LAMINECTOMY/DECOMPRESSION MICRODISCECTOMY N/A 07/19/2017   Procedure: LAMINECTOMY AND FORAMINOTOMY  LUMBAR TWO- LUMBAR THREE, LUMBAR THREE- LUMBAR FOUR;  Surgeon: Ashok Pall, MD;  Location: [facility];  Service: Neurosurgery;  Laterality: N/A;  . nerve transfer surgery to lwft hand Left    Nov. 2017  . SHOULDER ARTHROSCOPY W/ ROTATOR CUFF REPAIR Left 15   rotator cuff   . SHOULDER INJECTION  June 2012   left, [place]  . SKIN CANCER EXCISION  07/1993   left lateral arm  .  SUPERFICIAL KERATECTOMY Right 05/1998   wrist  . thumb injection  June 2012   left, [place]  . TONSILLECTOMY AND ADENOIDECTOMY    . TOTAL SHOULDER ARTHROPLASTY Left 12/25/2016   Procedure: TOTAL SHOULDER ARTHROPLASTY;  Surgeon: Marchia Bond, MD;  Location: [place];  Service: Orthopedics;  Laterality: Left;  . TOTAL SHOULDER ARTHROPLASTY Right 11/05/2017  . TOTAL SHOULDER ARTHROPLASTY Right 11/05/2017   Procedure: TOTAL SHOULDER ARTHROPLASTY;  Surgeon: Marchia Bond, MD;  Location: [facility];  Service: Orthopedics;  Laterality: Right;  . ulnar nerve transfer  12/1999   left  . ULNAR NERVE TRANSPOSITION Left         Home Medications    Prior to Admission medications   Medication Sig Start Date End Date Taking? Authorizing Provider  amLODipine (NORVASC) 5 MG tablet Take 5 mg by mouth daily.   Yes [provider]  atorvastatin (LIPITOR) 10 MG tablet Take 10 mg by mouth daily.   Yes [provider]  baclofen (LIORESAL) 10 MG tablet Take 1 tablet (10 mg total) by mouth 3 (  three) times daily. As needed for muscle spasm 12/25/16  Yes Marchia Bond, MD  ELIQUIS 5 MG TABS tablet TAKE 1 TABLET TWICE A DAY 03/26/17  Yes Kathrynn Ducking, MD  labetalol (NORMODYNE) 200 MG tablet Take 100 mg by mouth 2 (two) times daily.   Yes [provider]  latanoprost (XALATAN) 0.005 % ophthalmic solution Place 1 drop into the right eye at bedtime. 08/01/17  Yes [provider]  losartan (COZAAR) 50 MG tablet Take 50 mg by mouth daily. 03/30/18  Yes [provider]  meclizine (ANTIVERT) 25 MG tablet Take 1 tablet (25 mg total) by mouth 3 (three) times daily as needed. Patient taking differently: Take 25 mg by mouth 3 (three) times daily as needed for dizziness.  02/22/16  Yes Tanna Furry, MD  Multiple Vitamins-Minerals (PRESERVISION AREDS 2) CAPS Take 1 capsule by mouth 2 (two) times daily.    Yes [provider]  ondansetron (ZOFRAN) 4 MG tablet Take 1 tablet (4 mg total)  by mouth every 8 (eight) hours as needed for nausea or vomiting. 12/25/16  Yes Marchia Bond, MD  oxyCODONE-acetaminophen (PERCOCET) 10-325 MG tablet Take 1-2 tablets by mouth every 6 (six) hours as needed for pain. MAXIMUM TOTAL ACETAMINOPHEN DOSE IS 4000 MG PER DAY 11/05/17  Yes Marchia Bond, MD  traMADol (ULTRAM) 50 MG tablet Take 1 tablet (50 mg total) by mouth every 6 (six) hours as needed. 01/24/16  Yes Kathrynn Ducking, MD  dextromethorphan-guaiFENesin [facility] DM) 30-600 MG 12hr tablet Take 1 tablet by mouth 2 (two) times daily. Patient not taking: Reported on 04/14/2018 11/07/17   Fredia Sorrow, MD  gabapentin (NEURONTIN) 300 MG capsule TAKE 1 CAPSULE TWICE DURING THE DAY AND TAKE 2 CAPSULES AT NIGHT Patient not taking: Reported on 04/14/2018 10/03/17   Kathrynn Ducking, MD  sennosides-docusate sodium (SENOKOT-S) 8.6-50 MG tablet Take 2 tablets by mouth daily. Patient not taking: Reported on 04/14/2018 12/25/16   Marchia Bond, MD    Family History Family History  Problem Relation Age of Onset  . Dementia Mother 56       vascular  . Transient ischemic attack Mother   . Dementia Father        after hip fracture  . Hypertension Sister        skin cancer  . Seizures Neg Hx     Social History Social History   Tobacco Use  . Smoking status: Former Smoker    Packs/day: 2.00    Years: 13.00    Pack years: 26.00    Types: Cigarettes    Last attempt to quit: 03/05/1968    Years since quitting: 50.1  . Smokeless tobacco: Never Used  Substance Use Topics  . Alcohol use: Yes    Alcohol/week: 1.2 oz    Types: 2 Glasses of wine per week  . Drug use: No     Allergies   Maprotiline and Tetracycline hcl   Review of Systems Review of Systems  Constitutional: Negative for appetite change, diaphoresis and fever.  HENT: Negative for congestion and sore throat.   Eyes: Negative for visual disturbance.  Respiratory: Negative for cough, choking, shortness of breath and wheezing.     Cardiovascular: Positive for chest pain and palpitations. Negative for leg swelling.  Gastrointestinal: Negative for abdominal pain, diarrhea, nausea and vomiting.  Genitourinary: Negative for dysuria.  Musculoskeletal: Negative for back pain.  Skin: Negative for rash.  Allergic/Immunologic: Negative for immunocompromised state.  Neurological: Negative for dizziness, syncope, weakness,  light-headedness and headaches.  Psychiatric/Behavioral: Negative for confusion.   Physical Exam Updated Vital Signs BP 132/89   Pulse (!) 59   Temp 98.6 F (37 C) (Oral)   Resp 17   Ht 6\' 1"  (1.854 m)   Wt 102.1 kg (225 lb)   SpO2 97%   BMI 29.69 kg/m   Physical Exam  Constitutional: He appears well-developed.  HENT:  Head: Normocephalic.  Eyes: Conjunctivae are normal.  Neck: Neck supple. No JVD present.  Cardiovascular: Regular rhythm, S1 normal, S2 normal and normal heart sounds. Bradycardia present. Exam reveals no gallop, no S3, no S4, no distant heart sounds and no friction rub.  No murmur heard. Pulses:      Radial pulses are 2+ on the right side, and 2+ on the left side.       Dorsalis pedis pulses are 2+ on the right side, and 2+ on the left side.  Pulmonary/Chest: Effort normal. No stridor. No respiratory distress. He has wheezes in the right lower field. He has no rhonchi. He has no rales. He exhibits no tenderness.  Abdominal: Soft. He exhibits no mass. There is no tenderness. There is no rebound and no guarding. No hernia.  Obese abdomen.   Musculoskeletal: Normal range of motion. He exhibits no edema, tenderness or deformity.       Right lower leg: Normal. He exhibits no tenderness and no edema.       Left lower leg: Normal. He exhibits no tenderness and no edema.  Neurological: He is alert.  Skin: Skin is warm and dry. Capillary refill takes less than 2 seconds. No rash noted.  Psychiatric: His behavior is normal.  Nursing note and vitals reviewed.  ED Treatments / Results   Labs (all labs ordered are listed, but only abnormal results are displayed) Labs Reviewed  CBC - Abnormal; Notable for the following components:      Result Value   RBC 3.99 (*)    Hemoglobin 12.5 (*)    HCT 38.9 (*)    All other components within normal limits  COMPREHENSIVE METABOLIC PANEL - Abnormal; Notable for the following components:   CO2 21 (*)    Glucose, Bld 116 (*)    Calcium 8.8 (*)    Total Protein 6.3 (*)    All other components within normal limits  CBG MONITORING, ED - Abnormal; Notable for the following components:   Glucose-Capillary 124 (*)    All other components within normal limits  TROPONIN I  TROPONIN I    EKG EKG Interpretation  Date/Time:  Monday April 14 2018 07:47:36 EDT Ventricular Rate:  51 PR Interval:    QRS Duration: 101 QT Interval:  444 QTC Calculation: 409 R Axis:   -36 Text Interpretation:  sinus rhythm vs. afib Left axis deviation Borderline T wave abnormalities No acute changes Confirmed by Varney Biles ([id number]) on 04/14/2018 7:54:33 AM   Radiology Dg Chest 2 View  Result Date: 04/14/2018 CLINICAL DATA:  82 year old male with rapid heart rate. History of atrial fibrillation. Initial encounter. EXAM: CHEST - 2 VIEW COMPARISON:  11/07/2017, 11/01/2015 and 11/24/2014 chest x-ray. FINDINGS: Slightly elevated left hemidiaphragm as noted on prior exams. Cardiomegaly. No infiltrate, congestive heart failure or pneumothorax. Calcified minimally tortuous aorta. Prior bilateral shoulder replacements and lower cervical spine surgery. IMPRESSION: Cardiomegaly. No infiltrate or congestive heart failure. Aortic Atherosclerosis (ICD10-I70.0). Electronically Signed   By: Genia Del M.D.   On: 04/14/2018 08:37   Nm Myocar Multi W/spect W/wall Motion /  Ef  Result Date: 04/14/2018  There was no ST segment deviation noted during stress.  Defect 1: There is a medium defect of moderate severity.  Findings consistent with ischemia.  This is an  intermediate risk study.  Nuclear stress EF: 53%.  No T wave inversion was noted during stress.  Medium size, moderate severity partially reversible inferoseptal and apical perfusion defect (SDS 5), suggestive of ischemia. LVEF 53%. Borderline elevated TID ratio of 1.2. This is an intermediate risk study. Clinical correlation is advised.    Procedures Procedures (including critical care time)  Medications Ordered in ED Medications  regadenoson (LEXISCAN) 0.4 MG/5ML injection SOLN (has no administration in time range)  technetium tetrofosmin (TC-MYOVIEW) injection 10 millicurie (10 millicuries Intravenous Contrast Given 04/14/18 1212)  regadenoson (LEXISCAN) injection SOLN 0.4 mg (0.4 mg Intravenous Given by Other 04/14/18 1313)  technetium tetrofosmin (TC-MYOVIEW) injection 30 millicurie (30 millicuries Intravenous Contrast Given 04/14/18 1314)     Initial Impression / Assessment and Plan / ED Course  I have reviewed the triage vital signs and the nursing notes.  Pertinent labs & imaging results that were available during my care of the patient were reviewed by me and considered in my medical decision making (see chart for details).     82 year old male with a h/o of paroxysmal Afib on Eliquis, multiple TIAs, MGUS, peripheral neuropathy, dyslipidemia, and HTN who presents to the emergency department with a chief complaint of chest pain, concerning for cardiac etiology.  Patient was seen and evaluated along with Dr. Kathrynn Humble, attending physician.  Hemodynamically stable in the ED.   EKG with no changes from previous.  Chest x-ray with mild calcified cardiomegaly and tortuous aorta, but otherwise unremarkable.  Delta troponin is negative.  Labs are otherwise reassuring.  The patient was given 324 mg of ASA and nitroglycerin in route by EMS.  He reports complete resolution of his pain with nitroglycerin.  Spoke with Trish and cardiology who was able to get the patient scheduled for a stress test  from the ED.  Cardiology recommend discharged home if stress test was negative and admission if abnormal.  Stress test with medium size, moderate severity partially reversible inferolateral septal and apical perfusion defect, suggestive of ischemia.  Intermediate risk study.  Cardiology will admit.   The patient appears reasonably stabilized for admission considering the current resources, flow, and capabilities available in the ED at this time, and I doubt any other [facility] requiring further screening and/or treatment in the ED prior to admission.   Final Clinical Impressions(s) / ED Diagnoses   Final diagnoses:  Abnormal cardiovascular stress test  Chest pain, unspecified type    ED Discharge Orders    None       McDonald, Laymond Purser, PA-C 04/14/18 Steele, Ankit, MD 04/22/18 2317

## 2018-04-14 NOTE — Progress Notes (Signed)
   Jose [name]. presented for a nuclear stress test today.  No immediate complications.  Stress imaging is pending at this time.  Preliminary EKG findings may be listed in the chart, but the stress test result will not be finalized until perfusion imaging is complete.  One day study.  Abigail Butts, PA-C 04/14/2018, 1:31 PM

## 2018-04-14 NOTE — ED Notes (Signed)
Pt to go for stress test next

## 2018-04-14 NOTE — ED Notes (Signed)
Heart Healthy Dinner Tray Ordered @ 1806-per RN-called by Levada Dy

## 2018-04-14 NOTE — ED Notes (Signed)
Pt called to nurses station requesting to have in-patient bed upstairs since he was told he was being admitted and has already been here going on 9 hours.  Apologized to pt for the wait and advised we are waiting for the hospitalist to come see him

## 2018-04-15 ENCOUNTER — Other Ambulatory Visit: Payer: Self-pay

## 2018-04-15 ENCOUNTER — Observation Stay (HOSPITAL_BASED_OUTPATIENT_CLINIC_OR_DEPARTMENT_OTHER): Payer: Medicare Other

## 2018-04-15 ENCOUNTER — Encounter (HOSPITAL_COMMUNITY): Payer: Self-pay | Admitting: Cardiovascular Disease

## 2018-04-15 ENCOUNTER — Ambulatory Visit (HOSPITAL_COMMUNITY)
Admission: EM | Disposition: A | Discharge status: Home / Self Care | Payer: Self-pay | Source: Home / Self Care | Attending: Emergency Medicine

## 2018-04-15 DIAGNOSIS — I110 Hypertensive heart disease with heart failure: Secondary | ICD-10-CM | POA: Diagnosis not present

## 2018-04-15 DIAGNOSIS — R001 Bradycardia, unspecified: Secondary | ICD-10-CM | POA: Diagnosis not present

## 2018-04-15 DIAGNOSIS — I25110 Atherosclerotic heart disease of native coronary artery with unstable angina pectoris: Secondary | ICD-10-CM | POA: Diagnosis not present

## 2018-04-15 DIAGNOSIS — R9439 Abnormal result of other cardiovascular function study: Secondary | ICD-10-CM | POA: Diagnosis not present

## 2018-04-15 DIAGNOSIS — R079 Chest pain, unspecified: Secondary | ICD-10-CM

## 2018-04-15 HISTORY — PX: LEFT HEART CATH AND CORONARY ANGIOGRAPHY: CATH118249

## 2018-04-15 LAB — ECHOCARDIOGRAM COMPLETE
Height: 72 in
Weight: 3583.8 oz

## 2018-04-15 LAB — BASIC METABOLIC PANEL
ANION GAP: 8 (ref 5–15)
BUN: 14 mg/dL (ref 6–20)
CALCIUM: 8.6 mg/dL — AB (ref 8.9–10.3)
CHLORIDE: 107 mmol/L (ref 101–111)
CO2: 26 mmol/L (ref 22–32)
Creatinine, Ser: 0.9 mg/dL (ref 0.61–1.24)
GFR calc Af Amer: >60 mL/min (ref >60)
GFR calc non Af Amer: >60 mL/min (ref >60)
GLUCOSE: 115 mg/dL — AB (ref 65–99)
POTASSIUM: 3.3 mmol/L — AB (ref 3.5–5.1)
Sodium: 141 mmol/L (ref 135–145)

## 2018-04-15 LAB — LIPID PANEL
CHOL/HDL RATIO: 3.8 ratio
Cholesterol: 126 mg/dL (ref 0–200)
HDL: 33 mg/dL — AB (ref >40)
LDL CALC: 54 mg/dL (ref 0–99)
Triglycerides: 193 mg/dL — ABNORMAL HIGH (ref <150)
VLDL: 39 mg/dL (ref 0–40)

## 2018-04-15 LAB — PROTIME-INR
INR: 1.23
Prothrombin Time: 15.4 seconds — ABNORMAL HIGH (ref 11.4–15.2)

## 2018-04-15 LAB — HEMOGLOBIN A1C
Hgb A1c MFr Bld: 5.7 % — ABNORMAL HIGH (ref 4.8–5.6)
Mean Plasma Glucose: 116.89 mg/dL

## 2018-04-15 SURGERY — LEFT HEART CATH AND CORONARY ANGIOGRAPHY
Anesthesia: LOCAL

## 2018-04-15 MED ORDER — IOPAMIDOL (ISOVUE-370) INJECTION 76%
INTRAVENOUS | Status: AC
  Filled 2018-04-15: qty 100

## 2018-04-15 MED ORDER — HEPARIN SODIUM (PORCINE) 1000 UNIT/ML IJ SOLN
INTRAMUSCULAR | Status: AC
  Filled 2018-04-15: qty 1

## 2018-04-15 MED ORDER — ACETAMINOPHEN 325 MG PO TABS: 650.0000 mg | ORAL_TABLET | ORAL | Status: DC | PRN

## 2018-04-15 MED ORDER — HEPARIN (PORCINE) IN NACL 1000-0.9 UT/500ML-% IV SOLN
INTRAVENOUS | Status: AC
  Filled 2018-04-15: qty 1000

## 2018-04-15 MED ORDER — SODIUM CHLORIDE 0.9 % IV SOLN: 250.0000 mL | INTRAVENOUS | Status: DC | PRN

## 2018-04-15 MED ORDER — SODIUM CHLORIDE 0.9 % IV SOLN: INTRAVENOUS | Status: AC

## 2018-04-15 MED ORDER — SODIUM CHLORIDE 0.9% FLUSH
3.0000 mL | Freq: Two times a day (BID) | INTRAVENOUS | Status: DC
  Administered 2018-04-15 – 2018-04-16 (×2): 3 mL via INTRAVENOUS

## 2018-04-15 MED ORDER — POTASSIUM CHLORIDE CRYS ER 20 MEQ PO TBCR
40.0000 meq | EXTENDED_RELEASE_TABLET | Freq: Once | ORAL | Status: AC
  Administered 2018-04-15: 40 meq via ORAL
  Filled 2018-04-15: qty 2

## 2018-04-15 MED ORDER — IOPAMIDOL (ISOVUE-370) INJECTION 76%
INTRAVENOUS | Status: DC | PRN
  Administered 2018-04-15: 80 mL via INTRA_ARTERIAL

## 2018-04-15 MED ORDER — FENTANYL CITRATE (PF) 100 MCG/2ML IJ SOLN
INTRAMUSCULAR | Status: DC | PRN
  Administered 2018-04-15: 25 ug via INTRAVENOUS

## 2018-04-15 MED ORDER — HEPARIN (PORCINE) IN NACL 2-0.9 UNITS/ML
INTRAMUSCULAR | Status: DC | PRN
  Administered 2018-04-15: 11:00:00 via INTRA_ARTERIAL

## 2018-04-15 MED ORDER — ISOSORBIDE MONONITRATE ER 30 MG PO TB24
30.0000 mg | ORAL_TABLET | Freq: Every day | ORAL | Status: DC
Start: 2018-04-15 — End: 2018-04-16
  Administered 2018-04-15 – 2018-04-16 (×2): 30 mg via ORAL
  Filled 2018-04-15 (×3): qty 1

## 2018-04-15 MED ORDER — ONDANSETRON HCL 4 MG/2ML IJ SOLN: 4.0000 mg | Freq: Four times a day (QID) | INTRAMUSCULAR | Status: DC | PRN

## 2018-04-15 MED ORDER — HEPARIN (PORCINE) IN NACL 2-0.9 UNITS/ML
INTRAMUSCULAR | Status: AC | PRN
  Administered 2018-04-15: 1000 mL via INTRA_ARTERIAL

## 2018-04-15 MED ORDER — LIDOCAINE HCL (PF) 1 % IJ SOLN
INTRAMUSCULAR | Status: AC
  Filled 2018-04-15: qty 30

## 2018-04-15 MED ORDER — HEPARIN SODIUM (PORCINE) 1000 UNIT/ML IJ SOLN
INTRAMUSCULAR | Status: DC | PRN
  Administered 2018-04-15: 5000 [IU] via INTRAVENOUS

## 2018-04-15 MED ORDER — FENTANYL CITRATE (PF) 100 MCG/2ML IJ SOLN
INTRAMUSCULAR | Status: AC
  Filled 2018-04-15: qty 2

## 2018-04-15 MED ORDER — DIAZEPAM 5 MG PO TABS
5.0000 mg | ORAL_TABLET | Freq: Four times a day (QID) | ORAL | Status: DC | PRN
Start: 2018-04-15 — End: 2018-04-16

## 2018-04-15 MED ORDER — MIDAZOLAM HCL 2 MG/2ML IJ SOLN
INTRAMUSCULAR | Status: DC | PRN
  Administered 2018-04-15: 1 mg via INTRAVENOUS

## 2018-04-15 MED ORDER — LIDOCAINE HCL (PF) 1 % IJ SOLN
INTRAMUSCULAR | Status: DC | PRN
  Administered 2018-04-15: 2 mL

## 2018-04-15 MED ORDER — SODIUM CHLORIDE 0.9% FLUSH: 3.0000 mL | INTRAVENOUS | Status: DC | PRN

## 2018-04-15 MED ORDER — MIDAZOLAM HCL 2 MG/2ML IJ SOLN
INTRAMUSCULAR | Status: AC
  Filled 2018-04-15: qty 2

## 2018-04-15 MED ORDER — VERAPAMIL HCL 2.5 MG/ML IV SOLN
INTRAVENOUS | Status: AC
  Filled 2018-04-15: qty 2

## 2018-04-15 SURGICAL SUPPLY — 12 items
CATH INFINITI 5FR ANG PIGTAIL (CATHETERS) ×2 IMPLANT
CATH INFINITI JR4 5F (CATHETERS) ×2 IMPLANT
CATH OPTITORQUE TIG 4.0 5F (CATHETERS) ×2 IMPLANT
DEVICE RAD COMP TR BAND LRG (VASCULAR PRODUCTS) ×2 IMPLANT
GLIDESHEATH SLEND SS 6F .021 (SHEATH) ×2 IMPLANT
GUIDEWIRE INQWIRE 1.5J.035X260 (WIRE) ×1 IMPLANT
INQWIRE 1.5J .035X260CM (WIRE) ×2
KIT HEART LEFT (KITS) ×2 IMPLANT
PACK CARDIAC CATHETERIZATION (CUSTOM PROCEDURE TRAY) ×2 IMPLANT
SYR MEDRAD MARK V 150ML (SYRINGE) ×2 IMPLANT
TRANSDUCER W/STOPCOCK (MISCELLANEOUS) ×2 IMPLANT
TUBING CIL FLEX 10 FLL-RA (TUBING) ×2 IMPLANT

## 2018-04-15 NOTE — Progress Notes (Signed)
 Progress Note  Patient Name: Jose S Bossier Jr. Date of Encounter: 04/15/2018  Primary Cardiologist: Dr. Cheek ([place])  Subjective   No chest pain or dyspnea.   Inpatient Medications    Scheduled Meds: . amLODipine  5 mg Oral Daily  . aspirin EC  81 mg Oral Daily  . atorvastatin  80 mg Oral q1800  . enoxaparin (LOVENOX) injection  40 mg Subcutaneous Q24H  . latanoprost  1 drop Right Eye QHS  . multivitamin  1 tablet Oral BID  . potassium chloride  40 mEq Oral Once  . sodium chloride flush  3 mL Intravenous Q12H   Continuous Infusions: . sodium chloride    . sodium chloride 1 mL/kg/hr (04/15/18 0651)   PRN Meds: sodium chloride, acetaminophen, nitroGLYCERIN, ondansetron (ZOFRAN) IV, ondansetron, oxyCODONE-acetaminophen **AND** oxyCODONE, sodium chloride flush, traMADol   Vital Signs    Vitals:   04/14/18 1826 04/14/18 1855 04/14/18 1916 04/15/18 0300  BP: 140/73 (!) 145/84 138/78 126/78  Pulse: (!) 52 (!) 56 (!) 51 (!) 56  Resp: 20  18 18  Temp:  98.8 F (37.1 C) 98 F (36.7 C) 98.1 F (36.7 C)  TempSrc:  Oral Oral Oral  SpO2: 96% 98% 98% 99%  Weight:   224 lb 1.6 oz (101.7 kg) 223 lb 15.8 oz (101.6 kg)  Height:   6' (1.829 m)    No intake or output data in the 24 hours ending 04/15/18 0810 Filed Weights   04/14/18 0746 04/14/18 1916 04/15/18 0300  Weight: 225 lb (102.1 kg) 224 lb 1.6 oz (101.7 kg) 223 lb 15.8 oz (101.6 kg)    Telemetry    Sinus - Personally Reviewed  ECG    No am ekg - Personally Reviewed  Physical Exam   GEN: No acute distress.   Neck: No JVD Cardiac: RRR, no murmurs, rubs, or gallops.  Respiratory: Clear to auscultation bilaterally. GI: Soft, nontender, non-distended  MS: No edema; No deformity. Neuro:  Nonfocal  Psych: Normal affect   Labs    Chemistry Recent Labs  Lab 04/14/18 0820 04/14/18 1917 04/15/18 0351  NA 142  --  141  K 3.5  --  3.3*  CL 111  --  107  CO2 21*  --  26  GLUCOSE 116*  --  115*  BUN  19  --  14  CREATININE 0.94 0.83 0.90  CALCIUM 8.8*  --  8.6*  PROT 6.3*  --   --   ALBUMIN 3.9  --   --   AST 41  --   --   ALT 33  --   --   ALKPHOS 58  --   --   BILITOT 0.7  --   --   GFRNONAA >60 >60 >60  GFRAA >60 >60 >60  ANIONGAP 10  --  8     Hematology Recent Labs  Lab 04/14/18 0820 04/14/18 1917  WBC 4.9 5.7  RBC 3.99* 3.97*  HGB 12.5* 12.3*  HCT 38.9* 37.8*  MCV 97.5 95.2  MCH 31.3 31.0  MCHC 32.1 32.5  RDW 13.7 13.5  PLT 197 192    Cardiac Enzymes Recent Labs  Lab 04/14/18 0820 04/14/18 1121  TROPONINI <0.03 <0.03   No results for input(s): TROPIPOC in the last 168 hours.   BNPNo results for input(s): BNP, PROBNP in the last 168 hours.   DDimer No results for input(s): DDIMER in the last 168 hours.   Radiology    Dg Chest   2 View  Result Date: 04/14/2018 CLINICAL DATA:  82-year-old male with rapid heart rate. History of atrial fibrillation. Initial encounter. EXAM: CHEST - 2 VIEW COMPARISON:  11/07/2017, 11/01/2015 and 11/24/2014 chest x-ray. FINDINGS: Slightly elevated left hemidiaphragm as noted on prior exams. Cardiomegaly. No infiltrate, congestive heart failure or pneumothorax. Calcified minimally tortuous aorta. Prior bilateral shoulder replacements and lower cervical spine surgery. IMPRESSION: Cardiomegaly. No infiltrate or congestive heart failure. Aortic Atherosclerosis (ICD10-I70.0). Electronically Signed   By: Steven  Olson M.D.   On: 04/14/2018 08:37   Nm Myocar Multi W/spect W/wall Motion / Ef  Result Date: 04/14/2018  There was no ST segment deviation noted during stress.  Defect 1: There is a medium defect of moderate severity.  Findings consistent with ischemia.  This is an intermediate risk study.  Nuclear stress EF: 53%.  No T wave inversion was noted during stress.  Medium size, moderate severity partially reversible inferoseptal and apical perfusion defect (SDS 5), suggestive of ischemia. LVEF 53%. Borderline elevated TID ratio  of 1.2. This is an intermediate risk study. Clinical correlation is advised.    Cardiac Studies     Patient Profile     82 y.o. male with h/o HTN, HLD, OSA and evidence of aortic atherosclerosis. No known CAD. Also has PAF. Admitted with chest pain. Abnormal nuclear stress test.   Assessment & Plan    1. Unstable angina/abnormal stress test: Will plan cardiac cath today after lunch given dosing of Eliquis on Sunday night. Morning labs reviewed. I will let him have clear liquids this am. NPO after breakfast.  For questions or updates, please contact [facility] Please consult [email or web address] for contact info under Cardiology/STEMI.      Signed, Christopher McAlhany, MD  04/15/2018, 8:10 AM    

## 2018-04-15 NOTE — Progress Notes (Signed)
Pt had a clear liquid diet as ordered.  Last consumed at 0955.  Idolina Primer, RN

## 2018-04-15 NOTE — Progress Notes (Signed)
  Echocardiogram 2D Echocardiogram has been performed.  Merrie Roof F 04/15/2018, 1:04 PM

## 2018-04-15 NOTE — Interval H&P Note (Signed)
Cath Lab Visit (complete for each Cath Lab visit)  Clinical Evaluation Leading to the Procedure:   ACS: No.  Non-ACS:    Anginal Classification: CCS II  Anti-ischemic medical therapy: Minimal Therapy (1 class of medications)  Non-Invasive Test Results: Intermediate-risk stress test findings: cardiac mortality 1-3%/year  Prior CABG: No previous CABG      History and Physical Interval Note:  04/15/2018 10:33 AM  Jose [name].  has presented today for surgery, with the diagnosis of cp  The various methods of treatment have been discussed with the patient and family. After consideration of risks, benefits and other options for treatment, the patient has consented to  Procedure(s): LEFT HEART CATH AND CORONARY ANGIOGRAPHY (N/A) as a surgical intervention .  The patient's history has been reviewed, patient examined, no change in status, stable for surgery.  I have reviewed the patient's chart and labs.  Questions were answered to the patient's satisfaction.     Shelva Majestic

## 2018-04-15 NOTE — Care Management Obs Status (Signed)
[facility] NOTIFICATION   Patient Details  Name: Jose [name]. MRN: [id number] Date of Birth: 05-17-1936   Medicare Observation Status Notification Given:  Yes    Bethena Roys, RN 04/15/2018, 3:27 PM

## 2018-04-15 NOTE — Plan of Care (Signed)
  Problem: Education: Goal: Knowledge of General Education information will improve Outcome: Progressing   Problem: Health Behavior/Discharge Planning: Goal: Ability to manage health-related needs will improve Outcome: Progressing   Problem: Clinical Measurements: Goal: Ability to maintain clinical measurements within normal limits will improve Outcome: Progressing Goal: Will remain free from infection Outcome: Progressing Goal: Diagnostic test results will improve Outcome: Progressing Goal: Respiratory complications will improve Outcome: Progressing Goal: Cardiovascular complication will be avoided Outcome: Progressing   Problem: Safety: Goal: Ability to remain free from injury will improve Outcome: Progressing   Problem: Skin Integrity: Goal: Risk for impaired skin integrity will decrease Outcome: Progressing

## 2018-04-15 NOTE — H&P (View-Only) (Signed)
Progress Note  Patient Name: Jose [name]. Date of Encounter: 04/15/2018  Primary Cardiologist: Dr. Atilano Median [facility])  Subjective   No chest pain or dyspnea.   Inpatient Medications    Scheduled Meds: . amLODipine  5 mg Oral Daily  . aspirin EC  81 mg Oral Daily  . atorvastatin  80 mg Oral q1800  . enoxaparin (LOVENOX) injection  40 mg Subcutaneous Q24H  . latanoprost  1 drop Right Eye QHS  . multivitamin  1 tablet Oral BID  . potassium chloride  40 mEq Oral Once  . sodium chloride flush  3 mL Intravenous Q12H   Continuous Infusions: . sodium chloride    . sodium chloride 1 mL/kg/hr (04/15/18 0651)   PRN Meds: sodium chloride, acetaminophen, nitroGLYCERIN, ondansetron (ZOFRAN) IV, ondansetron, oxyCODONE-acetaminophen **AND** oxyCODONE, sodium chloride flush, traMADol   Vital Signs    Vitals:   04/14/18 1826 04/14/18 1855 04/14/18 1916 04/15/18 0300  BP: 140/73 (!) 145/84 138/78 126/78  Pulse: (!) 52 (!) 56 (!) 51 (!) 56  Resp: 20  18 18   Temp:  98.8 F (37.1 C) 98 F (36.7 C) 98.1 F (36.7 C)  TempSrc:  Oral Oral Oral  SpO2: 96% 98% 98% 99%  Weight:   224 lb 1.6 oz (101.7 kg) 223 lb 15.8 oz (101.6 kg)  Height:   6' (1.829 m)    No intake or output data in the 24 hours ending 04/15/18 0810 Filed Weights   04/14/18 0746 04/14/18 1916 04/15/18 0300  Weight: 225 lb (102.1 kg) 224 lb 1.6 oz (101.7 kg) 223 lb 15.8 oz (101.6 kg)    Telemetry    Sinus - Personally Reviewed  ECG    No am ekg - Personally Reviewed  Physical Exam   GEN: No acute distress.   Neck: No JVD Cardiac: RRR, no murmurs, rubs, or gallops.  Respiratory: Clear to auscultation bilaterally. GI: Soft, nontender, non-distended  MS: No edema; No deformity. Neuro:  Nonfocal  Psych: Normal affect   Labs    Chemistry Recent Labs  Lab 04/14/18 0820 04/14/18 1917 04/15/18 0351  NA 142  --  141  K 3.5  --  3.3*  CL 111  --  107  CO2 21*  --  26  GLUCOSE 116*  --  115*  BUN  19  --  14  CREATININE 0.94 0.83 0.90  CALCIUM 8.8*  --  8.6*  PROT 6.3*  --   --   ALBUMIN 3.9  --   --   AST 41  --   --   ALT 33  --   --   ALKPHOS 58  --   --   BILITOT 0.7  --   --   GFRNONAA >60 >60 >60  GFRAA >60 >60 >60  ANIONGAP 10  --  8     Hematology Recent Labs  Lab 04/14/18 0820 04/14/18 1917  WBC 4.9 5.7  RBC 3.99* 3.97*  HGB 12.5* 12.3*  HCT 38.9* 37.8*  MCV 97.5 95.2  MCH 31.3 31.0  MCHC 32.1 32.5  RDW 13.7 13.5  PLT 197 192    Cardiac Enzymes Recent Labs  Lab 04/14/18 0820 04/14/18 1121  TROPONINI <0.03 <0.03   No results for input(s): TROPIPOC in the last 168 hours.   BNPNo results for input(s): BNP, PROBNP in the last 168 hours.   DDimer No results for input(s): DDIMER in the last 168 hours.   Radiology    Dg Chest  2 View  Result Date: 04/14/2018 CLINICAL DATA:  82 year old male with rapid heart rate. History of atrial fibrillation. Initial encounter. EXAM: CHEST - 2 VIEW COMPARISON:  11/07/2017, 11/01/2015 and 11/24/2014 chest x-ray. FINDINGS: Slightly elevated left hemidiaphragm as noted on prior exams. Cardiomegaly. No infiltrate, congestive heart failure or pneumothorax. Calcified minimally tortuous aorta. Prior bilateral shoulder replacements and lower cervical spine surgery. IMPRESSION: Cardiomegaly. No infiltrate or congestive heart failure. Aortic Atherosclerosis (ICD10-I70.0). Electronically Signed   By: Genia Del M.D.   On: 04/14/2018 08:37   Nm Myocar Multi W/spect W/wall Motion / Ef  Result Date: 04/14/2018  There was no ST segment deviation noted during stress.  Defect 1: There is a medium defect of moderate severity.  Findings consistent with ischemia.  This is an intermediate risk study.  Nuclear stress EF: 53%.  No T wave inversion was noted during stress.  Medium size, moderate severity partially reversible inferoseptal and apical perfusion defect (SDS 5), suggestive of ischemia. LVEF 53%. Borderline elevated TID ratio  of 1.2. This is an intermediate risk study. Clinical correlation is advised.    Cardiac Studies     Patient Profile     82 y.o. male with h/o HTN, HLD, OSA and evidence of aortic atherosclerosis. No known CAD. Also has PAF. Admitted with chest pain. Abnormal nuclear stress test.   Assessment & Plan    1. Unstable angina/abnormal stress test: Will plan cardiac cath today after lunch given dosing of Eliquis on Sunday night. Morning labs reviewed. I will let him have clear liquids this am. NPO after breakfast.  For questions or updates, please contact [place] Please consult [email or web address] for contact info under Cardiology/STEMI.      Signed, Lauree Chandler, MD  04/15/2018, 8:10 AM

## 2018-04-16 ENCOUNTER — Encounter (HOSPITAL_COMMUNITY): Payer: Self-pay | Admitting: Cardiology

## 2018-04-16 DIAGNOSIS — R9439 Abnormal result of other cardiovascular function study: Secondary | ICD-10-CM | POA: Diagnosis not present

## 2018-04-16 DIAGNOSIS — I2510 Atherosclerotic heart disease of native coronary artery without angina pectoris: Secondary | ICD-10-CM

## 2018-04-16 DIAGNOSIS — I110 Hypertensive heart disease with heart failure: Secondary | ICD-10-CM | POA: Diagnosis not present

## 2018-04-16 DIAGNOSIS — R001 Bradycardia, unspecified: Secondary | ICD-10-CM | POA: Diagnosis not present

## 2018-04-16 DIAGNOSIS — I25119 Atherosclerotic heart disease of native coronary artery with unspecified angina pectoris: Secondary | ICD-10-CM

## 2018-04-16 DIAGNOSIS — I25110 Atherosclerotic heart disease of native coronary artery with unstable angina pectoris: Secondary | ICD-10-CM | POA: Diagnosis not present

## 2018-04-16 LAB — BASIC METABOLIC PANEL
Anion gap: 12 (ref 5–15)
BUN: 22 mg/dL — AB (ref 6–20)
CHLORIDE: 110 mmol/L (ref 101–111)
CO2: 20 mmol/L — AB (ref 22–32)
CREATININE: 0.92 mg/dL (ref 0.61–1.24)
Calcium: 8.9 mg/dL (ref 8.9–10.3)
GFR calc Af Amer: >60 mL/min (ref >60)
GFR calc non Af Amer: >60 mL/min (ref >60)
GLUCOSE: 103 mg/dL — AB (ref 65–99)
Potassium: 3.6 mmol/L (ref 3.5–5.1)
SODIUM: 142 mmol/L (ref 135–145)

## 2018-04-16 LAB — CBC
HCT: 39.3 % (ref 39.0–52.0)
Hemoglobin: 12.8 g/dL — ABNORMAL LOW (ref 13.0–17.0)
MCH: 31.2 pg (ref 26.0–34.0)
MCHC: 32.6 g/dL (ref 30.0–36.0)
MCV: 95.9 fL (ref 78.0–100.0)
PLATELETS: 182 10*3/uL (ref 150–400)
RBC: 4.1 MIL/uL — ABNORMAL LOW (ref 4.22–5.81)
RDW: 13.7 % (ref 11.5–15.5)
WBC: 8 10*3/uL (ref 4.0–10.5)

## 2018-04-16 MED ORDER — ATORVASTATIN CALCIUM 80 MG PO TABS: 80.0000 mg | ORAL_TABLET | Freq: Every day | ORAL | 2 refills | Status: DC

## 2018-04-16 MED ORDER — ISOSORBIDE MONONITRATE ER 30 MG PO TB24: 30.0000 mg | ORAL_TABLET | Freq: Every day | ORAL | 2 refills | Status: DC

## 2018-04-16 MED ORDER — NITROGLYCERIN 0.4 MG SL SUBL: 0.4000 mg | SUBLINGUAL_TABLET | SUBLINGUAL | 1 refills | Status: DC | PRN

## 2018-04-16 MED ORDER — ASPIRIN 81 MG PO TBEC: 81.0000 mg | DELAYED_RELEASE_TABLET | Freq: Every day | ORAL | Status: DC

## 2018-04-16 MED FILL — Heparin Sod (Porcine)-NaCl IV Soln 1000 Unit/500ML-0.9%: INTRAVENOUS | Qty: 1000 | Status: AC

## 2018-04-16 MED FILL — ATORVASTATIN 80 MG TABLET: 80 | 30 days supply | Qty: 30 | Fill #0

## 2018-04-16 MED FILL — ISOSORBIDE MN ER 30 MG TAB: 30 | 30 days supply | Qty: 30 | Fill #0

## 2018-04-16 MED FILL — NITROGLYCERIN 0.4 MG TAB SL: 0.4 | 30 days supply | Qty: 25 | Fill #0

## 2018-04-16 NOTE — Discharge Summary (Signed)
Discharge Summary    Patient ID: Jose [name].,  MRN: [id number], DOB/AGE: 80-Aug-1937 80 y.o.  Admit date: 04/14/2018 Discharge date: 04/16/2018  Primary Care Provider: Karlene Einstein Primary Cardiologist: Previously Dr. Atilano Median, Now Dr. Angelena Form  Discharge Diagnoses    Active Problems:   Chest pain   Abnormal cardiovascular stress test   HYPERTENSION, BENIGN SYSTEMIC   Hyperlipidemia   CAD (coronary artery disease)   Allergies Allergies  Allergen Reactions  . Maprotiline     TETRACYCLIC ANTIDEPRESSANTS UNSPECIFIED REACTION (patient is unaware of allergy)  . Tetracycline Hcl Itching and Rash    Rash on hands    Diagnostic Studies/Procedures    Cath: 04/15/18  Conclusion     Mid LAD-1 lesion is 30% stenosed.  Mid LAD-2 lesion is 65% stenosed.  The left ventricular systolic function is normal.  LV end diastolic pressure is normal.  Ost LAD to Prox LAD lesion is 20% stenosed.   Normal LV function with an ejection fraction of 55 to 60% without segmental wall motion abnormalities.  Single-vessel CAD with smooth 20% proximal LAD narrowing, smooth 30% narrowing immediately before and after the takeoff of the second diagonal vessel, and smooth 65% distal LAD stenosis with a small distal LAD; normal left circumflex system; and very large dominant RCA with minimal luminal irregularity without significant stenoses.  The vessel gives rise to a large PDA and PLA which supplies the entire inferior and posterior lateral wall extending to the apex.  RECOMMENDATION: Medical therapy.  Low-dose nitrate therapy will be initiated.  Continue aggressive lipid-lowering therapy with high potency statin and target LDL less than 70.  Continue optimal treatment for hypertension.  Recommend resumption of Eliquis tomorrow am.    TTE: 04/15/18  Study Conclusions  - Left ventricle: The cavity size was normal. Wall thickness was   increased in a pattern of moderate LVH. Systolic  function was   normal. The estimated ejection fraction was in the range of 60%   to 65%. Wall motion was normal; there were no regional wall   motion abnormalities. Doppler parameters are consistent with   abnormal left ventricular relaxation (grade 1 diastolic   dysfunction). - Aorta: Aortic root dimension: 40 mm (ED). - Aortic root: The aortic root is mildly dilated. - Mitral valve: Mildly thickened leaflets . There was trivial   regurgitation. - Left atrium: The atrium was normal in size. - Inferior vena cava: The vessel was normal in size. The   respirophasic diameter changes were in the normal range (>= 50%),   consistent with normal central venous pressure.  Impressions:  - Compared to a prior study in 2015, the LVEF is higher at 60-65%.   The aortic root is dilated to 4.0 cm. _____________   History of Present Illness     Jose [name] is an 82 yo male with PMH of HTN, HL, PAF, TIAs, MGUS, and neuropathy who was in his usual state of health until he awoke from sleep the morning of admission around 6am with non-radiating left-sided chest discomfort which he described as fluttering. He reported taking his pulse and it felt irregular. He took his BP at home which was 140s/90s (his norm). He was concerned because he had not felt this sensation in the past and EMS was activated. He was given 1 SL nitro en route to the ED which relieved his symptoms completely.   He followed with Dr. Atilano Median at [facility] in [facility], and was last seen  12/2017 with complaints of orthostatic dizziness which occurred daily and though to be 2/2 low blood pressures. His HCTZ was discontinued at that visit to minimize orthostasis. He reported feeling well since that time until admission. He had never a LHC before. Prior to this, his last stress test was ~1-2 years ago and reportedly normal, although limited by hip pain. He denies family history of heart disease. Risk factors for ACS include HTN, HLD, OSA on  CPAP, and obesity.   At the time of this evaluation, he reported a dull burning sensation in the left side of his chest which is non-radiating. He stated he had experienced this discomfort 2-3 times per week for the past 2-3 months and has not noticed any exacerbating or alleviating factors. He is limited in mobility by L hip pain and is planned for Hip replacement 06/2018. He denies exertional chest pain, SOB, DOE, orthopnea, PND, or LE edema.   ED course: bradycardic, otherwise VSS. Labs notable for K 3.5, Cr 0.94, Hgb 12.5, PLT 197, Trop neg x2. EKG with sinus bradycardia with artifact vs atrial fibrillation with slow ventricular rate; no STE/D, no TWI. CXR with cardiomegaly and aortic atherosclerosis, otherwise benign. Patient underwent a NST during admission which revealed a medium defect of moderate severity concerning for ischemia; EF 53%. Cardiology asked to evaluate patient for ongoing care.    Hospital Course     Given his abnormal stress test he was set up for cardiac cath. Cardiac cath 04/15/18 with mild proximal and mid LAD stenosis with moderate disease in the smaller caliber distal LAD, no obstructive disease in the Circumflex and RCA.  He was continued ASA, statin and Imdur. Statin was increased from 10mg  Lipitor to 80mg  daily. His Eliquis was resumed the following morning as his cath site was stable. He discussed with Dr. Angelena Form and would like to transition his care to our practice.   Jose Roussel. was seen by Dr. Angelena Form and determined stable for discharge home. Follow up in the office has been arranged. Medications are listed below.   _____________  Discharge Vitals Blood pressure 125/85, pulse 61, temperature 98.4 F (36.9 C), temperature source Oral, resp. rate 18, height 6' (1.829 m), weight 222 lb 3.6 oz (100.8 kg), SpO2 97 %.  Filed Weights   04/14/18 1916 04/15/18 0300 04/16/18 0446  Weight: 224 lb 1.6 oz (101.7 kg) 223 lb 15.8 oz (101.6 kg) 222 lb 3.6 oz (100.8  kg)    Labs & Radiologic Studies    CBC Recent Labs    04/14/18 1917 04/16/18 0410  WBC 5.7 8.0  HGB 12.3* 12.8*  HCT 37.8* 39.3  MCV 95.2 95.9  PLT 192 638   Basic Metabolic Panel Recent Labs    04/15/18 0351 04/16/18 0410  NA 141 142  K 3.3* 3.6  CL 107 110  CO2 26 20*  GLUCOSE 115* 103*  BUN 14 22*  CREATININE 0.90 0.92  CALCIUM 8.6* 8.9   Liver Function Tests Recent Labs    04/14/18 0820  AST 41  ALT 33  ALKPHOS 58  BILITOT 0.7  PROT 6.3*  ALBUMIN 3.9   No results for input(s): LIPASE, AMYLASE in the last 72 hours. Cardiac Enzymes Recent Labs    04/14/18 0820 04/14/18 1121  TROPONINI <0.03 <0.03   BNP Invalid input(s): POCBNP D-Dimer No results for input(s): DDIMER in the last 72 hours. Hemoglobin A1C Recent Labs    04/15/18 0351  HGBA1C 5.7*   Fasting Lipid Panel Recent  Labs    04/15/18 0351  CHOL 126  HDL 33*  LDLCALC 54  TRIG 193*  CHOLHDL 3.8   Thyroid Function Tests No results for input(s): TSH, T4TOTAL, T3FREE, THYROIDAB in the last 72 hours.  Invalid input(s): FREET3 _____________  Dg Chest 2 View  Result Date: 04/14/2018 CLINICAL DATA:  82 year old male with rapid heart rate. History of atrial fibrillation. Initial encounter. EXAM: CHEST - 2 VIEW COMPARISON:  11/07/2017, 11/01/2015 and 11/24/2014 chest x-ray. FINDINGS: Slightly elevated left hemidiaphragm as noted on prior exams. Cardiomegaly. No infiltrate, congestive heart failure or pneumothorax. Calcified minimally tortuous aorta. Prior bilateral shoulder replacements and lower cervical spine surgery. IMPRESSION: Cardiomegaly. No infiltrate or congestive heart failure. Aortic Atherosclerosis (ICD10-I70.0). Electronically Signed   By: Genia Del M.D.   On: 04/14/2018 08:37   Nm Myocar Multi W/spect W/wall Motion / Ef  Result Date: 04/14/2018  There was no ST segment deviation noted during stress.  Defect 1: There is a medium defect of moderate severity.  Findings  consistent with ischemia.  This is an intermediate risk study.  Nuclear stress EF: 53%.  No T wave inversion was noted during stress.  Medium size, moderate severity partially reversible inferoseptal and apical perfusion defect (SDS 5), suggestive of ischemia. LVEF 53%. Borderline elevated TID ratio of 1.2. This is an intermediate risk study. Clinical correlation is advised.   Disposition   Pt is being discharged home today in good condition.  Follow-up Plans & Appointments    Follow-up Information    Consuelo Pandy, PA-C Follow up on 05/08/2018.   Specialties:  Cardiology, Radiology Why:  at 11am for your follow up appt.  Contact information: [address] [place] [address] [phone]          Discharge Instructions    Call MD for:  redness, tenderness, or signs of infection (pain, swelling, redness, odor or green/yellow discharge around incision site)   Complete by:  As directed    Diet - low sodium heart healthy   Complete by:  As directed    Discharge instructions   Complete by:  As directed    Radial Site Care Refer to this sheet in the next few weeks. These instructions provide you with information on caring for yourself after your procedure. Your caregiver may also give you more specific instructions. Your treatment has been planned according to current medical practices, but problems sometimes occur. Call your caregiver if you have any problems or questions after your procedure. HOME CARE INSTRUCTIONS You may shower the day after the procedure.Remove the bandage (dressing) and gently wash the site with plain soap and water.Gently pat the site dry.  Do not apply powder or lotion to the site.  Do not submerge the affected site in water for 3 to 5 days.  Inspect the site at least twice daily.  Do not flex or bend the affected arm for 24 hours.  No lifting over 5 pounds (2.3 kg) for 5 days after your procedure.  Do not drive home if you are  discharged the same day of the procedure. Have someone else drive you.  You may drive 24 hours after the procedure unless otherwise instructed by your caregiver.  What to expect: Any bruising will usually fade within 1 to 2 weeks.  Blood that collects in the tissue (hematoma) may be painful to the touch. It should usually decrease in size and tenderness within 1 to 2 weeks.  SEEK IMMEDIATE MEDICAL CARE IF: You  have unusual pain at the radial site.  You have redness, warmth, swelling, or pain at the radial site.  You have drainage (other than a small amount of blood on the dressing).  You have chills.  You have a fever or persistent symptoms for more than 72 hours.  You have a fever and your symptoms suddenly get worse.  Your arm becomes pale, cool, tingly, or numb.  You have heavy bleeding from the site. Hold pressure on the site.   Increase activity slowly   Complete by:  As directed      Discharge Medications     Medication List    TAKE these medications   amLODipine 5 MG tablet Commonly known as:  NORVASC Take 5 mg by mouth daily.   aspirin 81 MG EC tablet Take 1 tablet (81 mg total) by mouth daily. Start taking on:  04/17/2018   atorvastatin 80 MG tablet Commonly known as:  LIPITOR Take 1 tablet (80 mg total) by mouth daily at 6 PM. What changed:    medication strength  how much to take  when to take this   baclofen 10 MG tablet Commonly known as:  LIORESAL Take 1 tablet (10 mg total) by mouth 3 (three) times daily. As needed for muscle spasm   ELIQUIS 5 MG Tabs tablet Generic drug:  apixaban TAKE 1 TABLET TWICE A DAY   isosorbide mononitrate 30 MG 24 hr tablet Commonly known as:  IMDUR Take 1 tablet (30 mg total) by mouth daily. Start taking on:  04/17/2018   labetalol 200 MG tablet Commonly known as:  NORMODYNE Take 100 mg by mouth 2 (two) times daily.   latanoprost 0.005 % ophthalmic solution Commonly known as:  XALATAN Place 1 drop into the right  eye at bedtime.   losartan 50 MG tablet Commonly known as:  COZAAR Take 50 mg by mouth daily.   meclizine 25 MG tablet Commonly known as:  ANTIVERT Take 1 tablet (25 mg total) by mouth 3 (three) times daily as needed. What changed:  reasons to take this   nitroGLYCERIN 0.4 MG SL tablet Commonly known as:  NITROSTAT Place 1 tablet (0.4 mg total) under the tongue every 5 (five) minutes x 3 doses as needed for chest pain.   ondansetron 4 MG tablet Commonly known as:  ZOFRAN Take 1 tablet (4 mg total) by mouth every 8 (eight) hours as needed for nausea or vomiting.   oxyCODONE-acetaminophen 10-325 MG tablet Commonly known as:  PERCOCET Take 1-2 tablets by mouth every 6 (six) hours as needed for pain. MAXIMUM TOTAL ACETAMINOPHEN DOSE IS 4000 MG PER DAY   PRESERVISION AREDS 2 Caps Take 1 capsule by mouth 2 (two) times daily.   traMADol 50 MG tablet Commonly known as:  ULTRAM Take 1 tablet (50 mg total) by mouth every 6 (six) hours as needed.         Outstanding Labs/Studies   FLP/LFTs in 6 weeks if tolerating statin increase.   Duration of Discharge Encounter   Greater than 30 minutes including physician time.  Signed, Reino Bellis NP-C 04/16/2018, 9:48 AM

## 2018-04-16 NOTE — Progress Notes (Signed)
The patient has been given his discharge instructions along with a new medication list and what to take today. He has prescriptions to pick up and follow up appointments. The patient has been educated on radial site care. He is discharging with his family via car.   Saddie Benders RN

## 2018-04-16 NOTE — Progress Notes (Addendum)
Progress Note  Patient Name: Jose [name]. Date of Encounter: 04/16/2018  Primary Cardiologist: Dr. Atilano Median [facility])  Subjective   No chest pain  Inpatient Medications    Scheduled Meds: . amLODipine  5 mg Oral Daily  . aspirin EC  81 mg Oral Daily  . atorvastatin  80 mg Oral q1800  . enoxaparin (LOVENOX) injection  40 mg Subcutaneous Q24H  . isosorbide mononitrate  30 mg Oral Daily  . latanoprost  1 drop Right Eye QHS  . multivitamin  1 tablet Oral BID  . sodium chloride flush  3 mL Intravenous Q12H   Continuous Infusions: . sodium chloride     PRN Meds: sodium chloride, acetaminophen, diazepam, nitroGLYCERIN, ondansetron (ZOFRAN) IV, ondansetron, oxyCODONE-acetaminophen **AND** oxyCODONE, sodium chloride flush, traMADol   Vital Signs    Vitals:   04/15/18 1200 04/15/18 1300 04/15/18 1452 04/16/18 0446  BP: 136/86 117/79 103/67 128/71  Pulse:   64 61  Resp:    18  Temp:   97.8 F (36.6 C) 98.4 F (36.9 C)  TempSrc:   Oral Oral  SpO2:   96% 97%  Weight:    222 lb 3.6 oz (100.8 kg)  Height:        Intake/Output Summary (Last 24 hours) at 04/16/2018 0817 Last data filed at 04/15/2018 1800 Gross per 24 hour  Intake 1574.12 ml  Output 890 ml  Net 684.12 ml   Filed Weights   04/14/18 1916 04/15/18 0300 04/16/18 0446  Weight: 224 lb 1.6 oz (101.7 kg) 223 lb 15.8 oz (101.6 kg) 222 lb 3.6 oz (100.8 kg)    Telemetry    NSR- Personally Reviewed  ECG    N/A - Personally Reviewed  Physical Exam   General: Well developed, well nourished, NAD  HEENT: OP clear, mucus membranes moist  SKIN: warm, dry. No rashes. Neuro: No focal deficits  Musculoskeletal: Muscle strength 5/5 all ext  Psychiatric: Mood and affect normal  Neck: No JVD, no carotid bruits, no thyromegaly, no lymphadenopathy.  Lungs:Clear bilaterally, no wheezes, rhonci, crackles Cardiovascular: Regular rate and rhythm. No murmurs, gallops or rubs. Abdomen:Soft. Bowel sounds present.  Non-tender.  Extremities: No lower extremity edema. Pulses are 2 + in the bilateral DP/PT.    Labs    Chemistry Recent Labs  Lab 04/14/18 0820 04/14/18 1917 04/15/18 0351 04/16/18 0410  NA 142  --  141 142  K 3.5  --  3.3* 3.6  CL 111  --  107 110  CO2 21*  --  26 20*  GLUCOSE 116*  --  115* 103*  BUN 19  --  14 22*  CREATININE 0.94 0.83 0.90 0.92  CALCIUM 8.8*  --  8.6* 8.9  PROT 6.3*  --   --   --   ALBUMIN 3.9  --   --   --   AST 41  --   --   --   ALT 33  --   --   --   ALKPHOS 58  --   --   --   BILITOT 0.7  --   --   --   GFRNONAA >60 >60 >60 >60  GFRAA >60 >60 >60 >60  ANIONGAP 10  --  8 12     Hematology Recent Labs  Lab 04/14/18 0820 04/14/18 1917 04/16/18 0410  WBC 4.9 5.7 8.0  RBC 3.99* 3.97* 4.10*  HGB 12.5* 12.3* 12.8*  HCT 38.9* 37.8* 39.3  MCV 97.5 95.2 95.9  MCH 31.3 31.0 31.2  MCHC 32.1 32.5 32.6  RDW 13.7 13.5 13.7  PLT 197 192 182    Cardiac Enzymes Recent Labs  Lab 04/14/18 0820 04/14/18 1121  TROPONINI <0.03 <0.03   No results for input(s): TROPIPOC in the last 168 hours.   BNPNo results for input(s): BNP, PROBNP in the last 168 hours.   DDimer No results for input(s): DDIMER in the last 168 hours.   Radiology    Dg Chest 2 View  Result Date: 04/14/2018 CLINICAL DATA:  82 year old male with rapid heart rate. History of atrial fibrillation. Initial encounter. EXAM: CHEST - 2 VIEW COMPARISON:  11/07/2017, 11/01/2015 and 11/24/2014 chest x-ray. FINDINGS: Slightly elevated left hemidiaphragm as noted on prior exams. Cardiomegaly. No infiltrate, congestive heart failure or pneumothorax. Calcified minimally tortuous aorta. Prior bilateral shoulder replacements and lower cervical spine surgery. IMPRESSION: Cardiomegaly. No infiltrate or congestive heart failure. Aortic Atherosclerosis (ICD10-I70.0). Electronically Signed   By: Genia Del M.D.   On: 04/14/2018 08:37   Nm Myocar Multi W/spect W/wall Motion / Ef  Result Date:  04/14/2018  There was no ST segment deviation noted during stress.  Defect 1: There is a medium defect of moderate severity.  Findings consistent with ischemia.  This is an intermediate risk study.  Nuclear stress EF: 53%.  No T wave inversion was noted during stress.  Medium size, moderate severity partially reversible inferoseptal and apical perfusion defect (SDS 5), suggestive of ischemia. LVEF 53%. Borderline elevated TID ratio of 1.2. This is an intermediate risk study. Clinical correlation is advised.    Cardiac Studies   Cardiac cath 04/15/18:  Mid LAD-1 lesion is 30% stenosed.  Mid LAD-2 lesion is 65% stenosed.  The left ventricular systolic function is normal.  LV end diastolic pressure is normal.  Ost LAD to Prox LAD lesion is 20% stenosed.   Normal LV function with an ejection fraction of 55 to 60% without segmental wall motion abnormalities.  Single-vessel CAD with smooth 20% proximal LAD narrowing, smooth 30% narrowing immediately before and after the takeoff of the second diagonal vessel, and smooth 65% distal LAD stenosis with a small distal LAD; normal left circumflex system; and very large dominant RCA with minimal luminal irregularity without significant stenoses.  The vessel gives rise to a large PDA and PLA which supplies the entire inferior and posterior lateral wall extending to the apex.  RECOMMENDATION: Medical therapy.  Low-dose nitrate therapy will be initiated.  Continue aggressive lipid-lowering therapy with high potency statin and target LDL less than 70.  Continue optimal treatment for hypertension.  Recommend resumption of Eliquis tomorrow am.   Echo 04/15/18: - Left ventricle: The cavity size was normal. Wall thickness was   increased in a pattern of moderate LVH. Systolic function was   normal. The estimated ejection fraction was in the range of 60%   to 65%. Wall motion was normal; there were no regional wall   motion abnormalities. Doppler  parameters are consistent with   abnormal left ventricular relaxation (grade 1 diastolic   dysfunction). - Aorta: Aortic root dimension: 40 mm (ED). - Aortic root: The aortic root is mildly dilated. - Mitral valve: Mildly thickened leaflets . There was trivial   regurgitation. - Left atrium: The atrium was normal in size. - Inferior vena cava: The vessel was normal in size. The   respirophasic diameter changes were in the normal range (>= 50%),   consistent with normal central venous pressure.  Patient Profile  82 y.o. male with h/o HTN, HLD, OSA and evidence of aortic atherosclerosis admitted with chest pain and subsequent abnormal nuclear stress test.   Assessment & Plan    1. Unstable angina/abnormal stress test: Pt admitted with fluttering and chest pain. Abnormal stress test. Cardiac cath 04/15/18 with mild proximal and mid LAD stenosis with moderate disease in the smaller caliber distal LAD, no obstructive disease in the Circumflex and RCA.  Will continue ASA, statin and Imdur. He will be discharged home today.   2. PAF: Resume Eliquis today. He is in sinus.   He would like to change to our practice for follow up. He will need to see an office APP in 3-4 weeks for cath follow up and then me in 6 months.    For questions or updates, please contact [place] Please consult [email or web address] for contact info under Cardiology/STEMI.      Signed, Lauree Chandler, MD  04/16/2018, 8:17 AM

## 2018-04-21 ENCOUNTER — Other Ambulatory Visit: Payer: Self-pay | Admitting: Neurology

## 2018-04-30 ENCOUNTER — Telehealth: Payer: Self-pay | Admitting: Cardiovascular Disease

## 2018-04-30 MED ORDER — ISOSORBIDE MONONITRATE ER 30 MG PO TB24: 30.0000 mg | ORAL_TABLET | Freq: Two times a day (BID) | ORAL | 6 refills | Status: DC

## 2018-04-30 MED FILL — ISOSORBIDE MN ER 30 MG TAB: 30 | 30 days supply | Qty: 60 | Fill #0

## 2018-04-30 NOTE — Telephone Encounter (Signed)
I spoke with pt. He reports he has been having feeling of discomfort in left chest for years.  Started on Imdur 30 mg recently when in hospital. He reports slight decrease in discomfort since starting Imdur but still has "vague, residual feeling" of discomfort in chest.  He saw primary care doctor yesterday who wanted him to contact cardiology to see if he should increase Imdur . (note in Care everywhere) Pt is seeing B. Rosita Fire, [place] on July 11,2019 for hospital follow up. Will forward to Dr. Angelena Form to see if he would like pt to increase Imdur and if anything else needed at this time.

## 2018-04-30 NOTE — Telephone Encounter (Signed)
I spoke with pt and gave him information from Dr. Angelena Form. Will send new prescription to [facility].

## 2018-04-30 NOTE — Telephone Encounter (Signed)
New Message    Pt c/o medication issue: 1. Name of Medication: isosorbide mononitrate (IMDUR) 30 MG 24 hr tablet  2. How are you currently taking this medication (dosage and times per dayTake 1 tablet (30 mg total) by mouth daily.).   3. Are you having a reaction (difficulty breathing--STAT) Left chest discomfort  4. What is your medication issue? Would like to increase dosage to 2 a day per PCP

## 2018-04-30 NOTE — Telephone Encounter (Signed)
He can try increasing the Imdur to 30 mg po BID and see if this helps. Recent cath reviewed and no indication for repeat ischemic testing. Jose [name]

## 2018-05-08 ENCOUNTER — Encounter: Payer: Self-pay | Admitting: Cardiology

## 2018-05-08 ENCOUNTER — Ambulatory Visit (INDEPENDENT_AMBULATORY_CARE_PROVIDER_SITE_OTHER): Payer: Medicare Other | Admitting: Cardiology

## 2018-05-08 VITALS — BP 128/80 | HR 63 | Ht 72.0 in | Wt 228.0 lb

## 2018-05-08 DIAGNOSIS — E785 Hyperlipidemia, unspecified: Secondary | ICD-10-CM | POA: Diagnosis not present

## 2018-05-08 NOTE — Progress Notes (Signed)
05/08/2018 Sultan Pargas   June 26, 1936  [id number]  Primary Physician Vista Lawman Youlanda Roys, MD Primary Cardiologist: Dr. Angelena Form (Previously Dr. Atilano Median at [facility])  Reason for Visit/CC: [facility] f/u for CAD   HPI:  Jose [name]. is being seen today for post hospital f/u.   He is an 82 yo male with PMH of HTN, HL, PAF, TIAs, MGUS, and neuropathy whowas in his usual state of health until he awoke from sleep the morning of admission around 6am with non-radiating left-sided chest discomfort which he described as fluttering.He reported taking his pulse and it felt irregular. He took his BP at home which was 140s/90s (his norm). He was concerned because he had not felt this sensation in the past and EMS was activated. He was given 1 SL nitro en route to the ED which relieved his symptoms completely.  ED course: bradycardic, otherwise VSS. Labs notable for K 3.5, Cr 0.94, Hgb 12.5, PLT 197, Trop neg x2. EKG with sinus bradycardia with artifact vs atrial fibrillation with slow ventricular rate; no STE/D, no TWI. CXR with cardiomegaly and aortic atherosclerosis, otherwise benign. Patient underwent a NST during admission which revealed a medium defect of moderate severity concerning for ischemia; EF 53%. Cath was recommended.   Cardiac cath 04/15/18 with mild proximal and mid LAD stenosis with moderate disease in the smaller caliber distal LAD, no obstructive disease in the Circumflex and RCA. He was continued ASA, statin and Imdur. Statin was increased from 10mg  Lipitor to 80mg  daily (LDL was 54). His Eliquis was resumed the following morning as his cath site was stable. He discussed with Dr. Angelena Form and would like to transition his care to our practice.  He is here today in f/u with his wife. Doing ok. No post cath complications. Right radial site is stable. Still with mid occasional CP, but not any worse. His PCP recently further increased his Imdur from 30 mg once daily to BID. He is  tolerating well w/o side effects. No HAs. BP stable. Also tolerating higher dose of Lipitor w/o side effects.     Cardiac Studies Cath: 04/15/18  Conclusion     Mid LAD-1 lesion is 30% stenosed.  Mid LAD-2 lesion is 65% stenosed.  The left ventricular systolic function is normal.  LV end diastolic pressure is normal.  Ost LAD to Prox LAD lesion is 20% stenosed.  Normal LV function with an ejection fraction of 55 to 60% without segmental wall motion abnormalities.  Single-vessel CAD with smooth 20% proximal LAD narrowing, smooth 30% narrowing immediately before and after the takeoff of the second diagonal vessel, and smooth 65% distal LAD stenosis with a small distal LAD; normal left circumflex system; and very large dominant RCA with minimal luminal irregularity without significant stenoses. The vessel gives rise to a large PDA and PLA which supplies the entire inferior and posterior lateral wall extending to the apex.  RECOMMENDATION: Medical therapy. Low-dose nitrate therapy will be initiated. Continue aggressive lipid-lowering therapy with high potency statin and target LDL less than 70. Continue optimal treatment for hypertension. Recommend resumption of Eliquis tomorrow am.    TTE: 04/15/18  Study Conclusions  - Left ventricle: The cavity size was normal. Wall thickness was increased in a pattern of moderate LVH. Systolic function was normal. The estimated ejection fraction was in the range of 60% to 65%. Wall motion was normal; there were no regional wall motion abnormalities. Doppler parameters are consistent with abnormal left ventricular relaxation (grade 1  diastolic dysfunction). - Aorta: Aortic root dimension: 40 mm (ED). - Aortic root: The aortic root is mildly dilated. - Mitral valve: Mildly thickened leaflets . There was trivial regurgitation. - Left atrium: The atrium was normal in size. - Inferior vena cava: The vessel was normal in  size. The respirophasic diameter changes were in the normal range (>= 50%), consistent with normal central venous pressure.  Impressions:  - Compared to a prior study in 2015, the LVEF is higher at 60-65%. The aortic root is dilated to 4.0 cm.     Current Meds  Medication Sig  . amLODipine (NORVASC) 5 MG tablet Take 5 mg by mouth daily.  Marland Kitchen aspirin EC 81 MG EC tablet Take 1 tablet (81 mg total) by mouth daily.  Marland Kitchen atorvastatin (LIPITOR) 80 MG tablet Take 1 tablet (80 mg total) by mouth daily at 6 PM.  . baclofen (LIORESAL) 10 MG tablet Take 1 tablet (10 mg total) by mouth 3 (three) times daily. As needed for muscle spasm  . ELIQUIS 5 MG TABS tablet TAKE 1 TABLET TWICE A DAY  . isosorbide mononitrate (IMDUR) 30 MG 24 hr tablet Take 1 tablet (30 mg total) by mouth 2 (two) times daily.  Marland Kitchen labetalol (NORMODYNE) 200 MG tablet Take 100 mg by mouth 2 (two) times daily.  Marland Kitchen latanoprost (XALATAN) 0.005 % ophthalmic solution Place 1 drop into the right eye at bedtime.  Marland Kitchen losartan (COZAAR) 50 MG tablet Take 50 mg by mouth daily.  . meclizine (ANTIVERT) 25 MG tablet Take 1 tablet (25 mg total) by mouth 3 (three) times daily as needed. (Patient taking differently: Take 25 mg by mouth 3 (three) times daily as needed for dizziness. )  . Multiple Vitamins-Minerals (PRESERVISION AREDS 2) CAPS Take 1 capsule by mouth 2 (two) times daily.   . nitroGLYCERIN (NITROSTAT) 0.4 MG SL tablet Place 1 tablet (0.4 mg total) under the tongue every 5 (five) minutes x 3 doses as needed for chest pain.  Marland Kitchen ondansetron (ZOFRAN) 4 MG tablet Take 1 tablet (4 mg total) by mouth every 8 (eight) hours as needed for nausea or vomiting.  Marland Kitchen oxyCODONE-acetaminophen (PERCOCET) 10-325 MG tablet Take 1-2 tablets by mouth every 6 (six) hours as needed for pain. MAXIMUM TOTAL ACETAMINOPHEN DOSE IS 4000 MG PER DAY  . traMADol (ULTRAM) 50 MG tablet Take 1 tablet (50 mg total) by mouth every 6 (six) hours as needed.   Allergies    Allergen Reactions  . Maprotiline     TETRACYCLIC ANTIDEPRESSANTS UNSPECIFIED REACTION (patient is unaware of allergy)  . Tetracycline Hcl Itching and Rash    Rash on hands   Past Medical History:  Diagnosis Date  . Arthritis   . CAD (coronary artery disease)    04/15/18 65% dLAD, 20% pLAD, 30% 2nd diag, Normal EF  . Dysrhythmia    afib  . Gallstones 07/1996  . History of CT scan of head 1997   small vessel disease  . History of ETT 05/2002    no EKG changes  . History of MRI of cervical spine 07/15/2002  . Hypertension   . Lumbar spondylolysis 12/17/2016  . MGUS (monoclonal gammopathy of unknown significance) 04/11/2012  . Peripheral neuropathy   . Pneumonia 10/2014  . PONV (postoperative nausea and vomiting)    once many years ago  . Primary localized osteoarthrosis of left shoulder 12/25/2016  . Primary localized osteoarthrosis of right shoulder 11/05/2017  . Rectal bleeding 12/2002   colonoscopy  . Seizures ([place])  last seizure 1976  . Skin cancer   . Sleep apnea    uses CPAP  . Spinal stenosis, lumbar region with neurogenic claudication   . Stroke [facility])    ?tia ?afib  . TIA (transient ischemic attack)   . 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 extremity 03/29/2015   Family History  Problem Relation Age of Onset  . Dementia Mother 53       vascular  . Transient ischemic attack Mother   . Dementia Father        after hip fracture  . Hypertension Sister        skin cancer  . Seizures Neg Hx    Past Surgical History:  Procedure Laterality Date  . ANTERIOR CERVICAL DECOMP/DISCECTOMY FUSION N/A 02/04/2015   Procedure: ANTERIOR CERVICAL DECOMPRESSION/DISCECTOMY FUSION CERVICAL FIVE-SIX,CERVICAL SIX-SEVEN;  Surgeon: Karie Chimera, MD;  Location: [place] NEURO ORS;  Service: Neurosurgery;  Laterality: N/A;  . BACK SURGERY    . CATARACT EXTRACTION Bilateral   . COLONOSCOPY W/ POLYPECTOMY  04/19/2003   tubular adenoma  . EYE  SURGERY     Bilateral Cataracts  . GREAT TOE ARTHRODESIS, INTERPHALANGEAL JOINT Left 1978  . JOINT REPLACEMENT    . joint replacement on right thumb Right   . LEFT HEART CATH AND CORONARY ANGIOGRAPHY N/A 04/15/2018   Procedure: LEFT HEART CATH AND CORONARY ANGIOGRAPHY;  Surgeon: Troy Sine, MD;  Location: [place] CV LAB;  Service: Cardiovascular;  Laterality: N/A;  . LUMBAR LAMINECTOMY/DECOMPRESSION MICRODISCECTOMY N/A 07/19/2017   Procedure: LAMINECTOMY AND FORAMINOTOMY  LUMBAR TWO- LUMBAR THREE, LUMBAR THREE- LUMBAR FOUR;  Surgeon: Ashok Pall, MD;  Location: [place];  Service: Neurosurgery;  Laterality: N/A;  . nerve transfer surgery to lwft hand Left    Nov. 2017  . SHOULDER ARTHROSCOPY W/ ROTATOR CUFF REPAIR Left 15   rotator cuff   . SHOULDER INJECTION  June 2012   left, [place]  . SKIN CANCER EXCISION  07/1993   left lateral arm  . SUPERFICIAL KERATECTOMY Right 05/1998   wrist  . thumb injection  June 2012   left, [place]  . TONSILLECTOMY AND ADENOIDECTOMY    . TOTAL SHOULDER ARTHROPLASTY Left 12/25/2016   Procedure: TOTAL SHOULDER ARTHROPLASTY;  Surgeon: Marchia Bond, MD;  Location: [place];  Service: Orthopedics;  Laterality: Left;  . TOTAL SHOULDER ARTHROPLASTY Right 11/05/2017  . TOTAL SHOULDER ARTHROPLASTY Right 11/05/2017   Procedure: TOTAL SHOULDER ARTHROPLASTY;  Surgeon: Marchia Bond, MD;  Location: [place];  Service: Orthopedics;  Laterality: Right;  . ulnar nerve transfer  12/1999   left  . ULNAR NERVE TRANSPOSITION Left    Social History   Socioeconomic History  . Marital status: Married    Spouse name: [name]  . Number of children: 2  . Years of education: 78  . Highest education level: Not on file  Occupational History  . Occupation: retired    Fish farm manager: UNEMPLOYED  Social Needs  . Financial resource strain: Not on file  . Food insecurity:    Worry: Not on file    Inability: Not on file  . Transportation needs:    Medical: Not on file      Non-medical: Not on file  Tobacco Use  . Smoking status: Former Smoker    Packs/day: 2.00    Years: 13.00    Pack years: 26.00    Types: Cigarettes    Last attempt to quit: 03/05/1968  Years since quitting: 50.2  . Smokeless tobacco: Never Used  Substance and Sexual Activity  . Alcohol use: Yes    Alcohol/week: 1.2 oz    Types: 2 Glasses of wine per week  . Drug use: No  . Sexual activity: Not Currently  Lifestyle  . Physical activity:    Days per week: Not on file    Minutes per session: Not on file  . Stress: Not on file  Relationships  . Social connections:    Talks on phone: Not on file    Gets together: Not on file    Attends religious service: Not on file    Active member of club or organization: Not on file    Attends meetings of clubs or organizations: Not on file    Relationship status: Not on file  . Intimate partner violence:    Fear of current or ex partner: Not on file    Emotionally abused: Not on file    Physically abused: Not on file    Forced sexual activity: Not on file  Other Topics Concern  . Not on file  Social History Narrative   Lives with 4th wife, [name], married 2010   Retired, organizational psychology   Unity Peterson, [place], [name], 2 children   Daughter, [place], in [place], 2 children   Patient is right handed   Patient drinks approximately 3-4 cups of caffeine daily     Review of Systems: General: negative for chills, fever, night sweats or weight changes.  Cardiovascular: negative for chest pain, dyspnea on exertion, edema, orthopnea, palpitations, paroxysmal nocturnal dyspnea or shortness of breath Dermatological: negative for rash Respiratory: negative for cough or wheezing Urologic: negative for hematuria Abdominal: negative for nausea, vomiting, diarrhea, bright red blood per rectum, melena, or hematemesis Neurologic: negative for visual changes, syncope, or dizziness All other systems reviewed and are  otherwise negative except as noted above.   Physical Exam:  Blood pressure 128/80, pulse 63, height 6' (1.829 m), weight 228 lb (103.4 kg), SpO2 95 %.  General appearance: alert, cooperative and no distress Neck: no carotid bruit and no JVD Lungs: clear to auscultation bilaterally Heart: regular rate and rhythm, S1, S2 normal, no murmur, click, rub or gallop Extremities: extremities normal, atraumatic, no cyanosis or edema Pulses: 2+ and symmetric Skin: Skin color, texture, turgor normal. No rashes or lesions Neurologic: Grossly normal  EKG Not performed -- personally reviewed   ASSESSMENT AND PLAN:   1. CAD: Cardiac cath 04/15/18 with mild proximal and mid LAD stenosis with moderate disease in the smaller caliber distal LAD, no obstructive disease in the Circumflex and RCA. No indication for PCI. Medial therapy. Imdur added and tolerating ok.   2. Lipids: LDL controlled at 54 mg/dL. However Lipitor was increased by Dr. Angelena Form from 10 mg to 80 mg daily due to mild CAD. He is tolerating higher dose well w/o side effects. Will check HFTs in 6 weeks and will also recheck lipids at that time.   3. HTN:  Controlled on current regimen. Tolerating addition of Imdur well.   4. PAF: on Eliquis for a/c. HR controlled. RRR on exam.    Follow-Up w/ Dr. Angelena Form in 3 months.   Qusay Villada Ladoris Gene, MHS [facility] 05/08/2018 11:21 AM

## 2018-05-08 NOTE — Patient Instructions (Signed)
Medication Instructions:  Your physician recommends that you continue on your current medications as directed. Please refer to the Current Medication list given to you today.  Labwork: Your physician recommends that you return for lab work in: 6-8 weeks (Lipid, liver).  Please make sure you come fasting for these labs (nothing to eat or drink after midnight except water).   Testing/Procedures: None  Follow-Up: Your physician recommends that you schedule a follow-up appointment in: 3-4 months with Dr. Angelena Form.    Any Other Special Instructions Will Be Listed Below (If Applicable).     If you need a refill on your cardiac medications before your next appointment, please call your pharmacy.

## 2018-05-13 ENCOUNTER — Other Ambulatory Visit: Payer: Self-pay | Admitting: Cardiovascular Disease

## 2018-05-13 MED ORDER — ISOSORBIDE MONONITRATE ER 30 MG PO TB24: 30.0000 mg | ORAL_TABLET | Freq: Two times a day (BID) | ORAL | 3 refills | Status: DC

## 2018-05-13 MED ORDER — ATORVASTATIN CALCIUM 80 MG PO TABS: 80.0000 mg | ORAL_TABLET | Freq: Every day | ORAL | 3 refills | Status: DC

## 2018-05-13 NOTE — Telephone Encounter (Signed)
Pt's medication was sent to pt's pharmacy as requested. Confirmation received.  °

## 2018-05-28 MED FILL — LOTEMAX 0.5% GEL: 0.5 | 25 days supply | Qty: 5 | Fill #0

## 2018-06-23 ENCOUNTER — Other Ambulatory Visit: Payer: Medicare Other | Admitting: *Deleted

## 2018-06-23 DIAGNOSIS — E785 Hyperlipidemia, unspecified: Secondary | ICD-10-CM

## 2018-06-23 LAB — HEPATIC FUNCTION PANEL
ALK PHOS: 81 IU/L (ref 39–117)
ALT: 46 IU/L — ABNORMAL HIGH (ref 0–44)
AST: 41 IU/L — AB (ref 0–40)
Albumin: 4.3 g/dL (ref 3.5–4.7)
BILIRUBIN, DIRECT: 0.23 mg/dL (ref 0.00–0.40)
Bilirubin Total: 0.7 mg/dL (ref 0.0–1.2)
TOTAL PROTEIN: 6.4 g/dL (ref 6.0–8.5)

## 2018-06-23 LAB — LIPID PANEL
CHOL/HDL RATIO: 2.5 ratio (ref 0.0–5.0)
CHOLESTEROL TOTAL: 98 mg/dL — AB (ref 100–199)
HDL: 39 mg/dL — ABNORMAL LOW (ref >39)
LDL CALC: 36 mg/dL (ref 0–99)
TRIGLYCERIDES: 117 mg/dL (ref 0–149)
VLDL Cholesterol Cal: 23 mg/dL (ref 5–40)

## 2018-06-25 ENCOUNTER — Telehealth: Payer: Self-pay | Admitting: *Deleted

## 2018-06-25 DIAGNOSIS — E785 Hyperlipidemia, unspecified: Secondary | ICD-10-CM

## 2018-06-25 MED ORDER — ATORVASTATIN CALCIUM 40 MG PO TABS: 40.0000 mg | ORAL_TABLET | Freq: Every day | ORAL | 3 refills | Status: DC

## 2018-06-25 MED FILL — ATORVASTATIN 40 MG TABLET: 40 | 90 days supply | Qty: 90 | Fill #0

## 2018-06-25 NOTE — Pre-Procedure Instructions (Signed)
Jose Skeans Jr.  06/25/2018      [facility] - [place], [place] [address] Phone: [phone] Fax: [place], [place] - [address] [place] [address] Phone: [phone] Fax: [phone]  [facility] [place], [place] - [address] [place] [address] Phone: [phone] Fax: [phone]    Your procedure is scheduled on Sept. 10  Report to [facility] at 5:30  A.M.  Call this number if you have problems the morning of surgery:  [phone]   Remember:  Do not eat or drink after midnight.      Take these medicines the morning of surgery with A SIP OF WATER :              Amlodipine (norvasc)             Isosorbide (imdur)             Labetalol (normodyne)             Meclizine (antivert) if needed            Nitroglycerine if needed            Eye drops              7 days prior to surgery STOP taking  Aleve, Naproxen, Ibuprofen, Motrin, Advil, Goody's, BC's, all herbal medications, fish oil, and all vitamins               STOP ASPIRIN AND ELIQUIS PER DR. LANDAU                Do not wear jewelry.  Do not wear lotions, powders, or perfumes, or deodorant.  Do not shave 48 hours prior to surgery.  Men may shave face and neck.  Do not bring valuables to the hospital.  [facility] is not responsible for any belongings or valuables.  Contacts, dentures or bridgework may not be worn into surgery.  Leave your suitcase in the car.  After surgery it may be brought to your room.  For patients admitted to the hospital, discharge time will be determined by your treatment team.  Patients discharged the day of surgery will not be allowed to drive home.    Special instructions:   [facility]- Preparing For Surgery  Before surgery, you can play an  important role. Because skin is not sterile, your skin needs to be as free of germs as possible. You can reduce the number of germs on your skin by washing with CHG (chlorahexidine gluconate) Soap before surgery.  CHG is an antiseptic cleaner which kills germs and bonds with the skin to continue killing germs even after washing.    Oral Hygiene is also important to reduce your risk of infection.  Remember - BRUSH YOUR TEETH THE MORNING OF SURGERY WITH YOUR REGULAR TOOTHPASTE  Please do not use if you have an allergy to CHG or antibacterial soaps. If your skin becomes reddened/irritated stop using the CHG.  Do not shave (including legs and underarms) for at least 48 hours prior to first CHG shower. It is OK to shave your face.  Please follow these instructions carefully.   1. Shower the NIGHT BEFORE SURGERY and the MORNING OF SURGERY with CHG.  2. If you chose to wash your hair, wash your hair first as usual with your normal shampoo.  3. After you shampoo, rinse your hair and body thoroughly to remove the shampoo.  4. Use CHG as you would any other liquid soap. You can apply CHG directly to the skin and wash gently with a scrungie or a clean washcloth.   5. Apply the CHG Soap to your body ONLY FROM THE NECK DOWN.  Do not use on open wounds or open sores. Avoid contact with your eyes, ears, mouth and genitals (private parts). Wash Face and genitals (private parts)  with your normal soap.  6. Wash thoroughly, paying special attention to the area where your surgery will be performed.  7. Thoroughly rinse your body with warm water from the neck down.  8. DO NOT shower/wash with your normal soap after using and rinsing off the CHG Soap.  9. Pat yourself dry with a CLEAN TOWEL.  10. Wear CLEAN PAJAMAS to bed the night before surgery, wear comfortable clothes the morning of surgery  11. Place CLEAN SHEETS on your bed the night of your first shower and DO NOT SLEEP WITH PETS.    Day of  Surgery:  Do not apply any deodorants/lotions.  Please wear clean clothes to the hospital/surgery center.   Remember to brush your teeth WITH YOUR REGULAR TOOTHPASTE.    Please read over the following fact sheets that you were given. Coughing and Deep Breathing, MRSA Information and Surgical Site Infection Prevention

## 2018-06-25 NOTE — Telephone Encounter (Signed)
-----   Message from [place], [place] sent at 06/25/2018  3:45 PM EDT ----- Cholesterol is well controlled and at recommended goal, however liver enzymes are mildly elevated. Recommend reducing Lipitor dose from 80 mg to 40 mg. Repeat HFTs and FLP in 6 weeks.

## 2018-06-26 ENCOUNTER — Encounter (HOSPITAL_COMMUNITY): Payer: Self-pay

## 2018-06-26 ENCOUNTER — Encounter (HOSPITAL_COMMUNITY)
Admission: RE | Admit: 2018-06-26 | Discharge: 2018-06-26 | Disposition: A | Discharge status: Home / Self Care | Payer: Medicare Other | Source: Ambulatory Visit | Attending: Orthopedic Surgery | Admitting: Orthopedic Surgery

## 2018-06-26 ENCOUNTER — Other Ambulatory Visit: Payer: Self-pay

## 2018-06-26 DIAGNOSIS — I2510 Atherosclerotic heart disease of native coronary artery without angina pectoris: Secondary | ICD-10-CM | POA: Insufficient documentation

## 2018-06-26 DIAGNOSIS — Z96612 Presence of left artificial shoulder joint: Secondary | ICD-10-CM | POA: Insufficient documentation

## 2018-06-26 DIAGNOSIS — Z9841 Cataract extraction status, right eye: Secondary | ICD-10-CM | POA: Insufficient documentation

## 2018-06-26 DIAGNOSIS — Z87891 Personal history of nicotine dependence: Secondary | ICD-10-CM | POA: Diagnosis not present

## 2018-06-26 DIAGNOSIS — Z981 Arthrodesis status: Secondary | ICD-10-CM | POA: Insufficient documentation

## 2018-06-26 DIAGNOSIS — Z96611 Presence of right artificial shoulder joint: Secondary | ICD-10-CM | POA: Diagnosis not present

## 2018-06-26 DIAGNOSIS — Z7901 Long term (current) use of anticoagulants: Secondary | ICD-10-CM | POA: Diagnosis not present

## 2018-06-26 DIAGNOSIS — D472 Monoclonal gammopathy: Secondary | ICD-10-CM | POA: Insufficient documentation

## 2018-06-26 DIAGNOSIS — I480 Paroxysmal atrial fibrillation: Secondary | ICD-10-CM | POA: Insufficient documentation

## 2018-06-26 DIAGNOSIS — I10 Essential (primary) hypertension: Secondary | ICD-10-CM | POA: Insufficient documentation

## 2018-06-26 DIAGNOSIS — Z9842 Cataract extraction status, left eye: Secondary | ICD-10-CM | POA: Insufficient documentation

## 2018-06-26 DIAGNOSIS — M1612 Unilateral primary osteoarthritis, left hip: Secondary | ICD-10-CM | POA: Insufficient documentation

## 2018-06-26 DIAGNOSIS — G4733 Obstructive sleep apnea (adult) (pediatric): Secondary | ICD-10-CM | POA: Insufficient documentation

## 2018-06-26 DIAGNOSIS — Z01812 Encounter for preprocedural laboratory examination: Secondary | ICD-10-CM | POA: Diagnosis present

## 2018-06-26 DIAGNOSIS — Z79899 Other long term (current) drug therapy: Secondary | ICD-10-CM | POA: Insufficient documentation

## 2018-06-26 DIAGNOSIS — Z7982 Long term (current) use of aspirin: Secondary | ICD-10-CM | POA: Insufficient documentation

## 2018-06-26 DIAGNOSIS — Z8673 Personal history of transient ischemic attack (TIA), and cerebral infarction without residual deficits: Secondary | ICD-10-CM | POA: Diagnosis not present

## 2018-06-26 LAB — CBC
HEMATOCRIT: 42.6 % (ref 39.0–52.0)
Hemoglobin: 13.4 g/dL (ref 13.0–17.0)
MCH: 31.2 pg (ref 26.0–34.0)
MCHC: 31.5 g/dL (ref 30.0–36.0)
MCV: 99.1 fL (ref 78.0–100.0)
Platelets: 180 10*3/uL (ref 150–400)
RBC: 4.3 MIL/uL (ref 4.22–5.81)
RDW: 13.1 % (ref 11.5–15.5)
WBC: 6.1 10*3/uL (ref 4.0–10.5)

## 2018-06-26 LAB — SURGICAL PCR SCREEN
MRSA, PCR: NEGATIVE
Staphylococcus aureus: NEGATIVE

## 2018-06-26 LAB — BASIC METABOLIC PANEL
Anion gap: 11 (ref 5–15)
BUN: 13 mg/dL (ref 8–23)
CHLORIDE: 107 mmol/L (ref 98–111)
CO2: 25 mmol/L (ref 22–32)
Calcium: 9.3 mg/dL (ref 8.9–10.3)
Creatinine, Ser: 0.86 mg/dL (ref 0.61–1.24)
GFR calc Af Amer: >60 mL/min (ref >60)
GFR calc non Af Amer: >60 mL/min (ref >60)
Glucose, Bld: 119 mg/dL — ABNORMAL HIGH (ref 70–99)
POTASSIUM: 4 mmol/L (ref 3.5–5.1)
SODIUM: 143 mmol/L (ref 135–145)

## 2018-06-26 LAB — PROTIME-INR
INR: 1.41
Prothrombin Time: 17.1 seconds — ABNORMAL HIGH (ref 11.4–15.2)

## 2018-06-26 NOTE — Progress Notes (Addendum)
Pt . Transferred his cardiac care from Dr. Atilano Median to [facility] care in June 2019 . Cardiac cath 04-14-2018 Sress test 04-14-2018 ECHO 04-15-2018  Pt. States he stopped his Eliquis for 3 days prior to previous surgeries but no one had told him anything this time.  Pt. Instructed to call Dr. Luanna Cole office to clarify when he is to stop Eliquis.

## 2018-06-27 ENCOUNTER — Other Ambulatory Visit (HOSPITAL_COMMUNITY): Payer: Medicare Other

## 2018-06-27 NOTE — Progress Notes (Addendum)
Anesthesia Chart Review:  Case:  [id number] Date/Time:  07/08/18 0715   Procedure:  LEFT TOTAL HIP ARTHROPLASTY (Left Hip)   Anesthesia type:  Choice   Pre-op diagnosis:  left hip djd   Location:  [facility] ROOM 04 / [facility]   Surgeon:  Marchia Bond, MD      DISCUSSION: Patient is an 82 year old male scheduled for the above procedure.   History includes former smoker (quit '69), post-operative N/V, CAD (medical therapy 03/2018; Imdur added), atrial fibrillation/PAF, HTN, MGUS, seizures (last '76), TIA (10/13/14:work-up revealedPAF), OSA (CPAP).  Previous cardiac clearance in March 2019 was with patient's former cardiologist Dr. Atilano Median. Since then patient had admission with cardiac cath 03/2018. He is now seeing Dr. Angelena Form. Claiborne Billings at Dr. Luanna Cole office to reach out to Dr. Angelena Form office to clarify ASA/Eliquis perioperative instructions. Discussed adding request to hold Eliquis for 3 days so spinal anesthesia can be considered.  Dr. Luanna Cole office to clarify ASA and Eliquis perioperative instructions once they receive additional input from cardiology. He will need a repeat PT/INR on the day of surgery.   UPDATE 07/02/18 4:33 PM:  Per telephone encounter on 07/02/18 by Leanor Kail, PA, "Jose [name]. was last seen on 04/30/18 by Lyda Jester.  Since that day, Jose [name]. has done well. His chest discomfort has been improved significantly. He is getting about 5 METs of activity per DASI, mostly limited by hip pain.  Therefore, based on ACC/AHA guidelines, the patient would be at acceptable risk for the planned procedure without further cardiovascular testing." Eliquis instructions were routed to [facility] with permission to hold 3 days prior to surgery, and Dr. Angelena Form gave permission to hold ASA one week prior to surgery. Sherri at Dr. Luanna Cole office aware and will notify patient.   If no acute changes then I anticipate that he can proceed as planned.    VS: BP  122/87   Pulse 66   Temp 36.7 C   Resp 20   Ht 6' (1.829 m)   Wt 101.6 kg   SpO2 97%   BMI 30.38 kg/m   PROVIDERS: - PCP is Charlcie Cradle, MD. Last visti 04/29/18 ([place]). - Cardiologist is Lauree Chandler, MD. First established 04/15/18 when he presented to [facility] with chest pain and fluttering. Ultimately had cardiac cath that showed 1V CAD. Medical therapy recommended. Eliquis continued for afib history. (Previously he was seeing Gwenith Spitz, MD.) Last visit on 05/08/18 with Lyda Jester, PA-C for hospital follow-up. Continued medical therapy for CAD recommended.  - Neurologist is Margette Fast, MD, last office visit 04/01/17 - Pulmonologist Simonne Maffucci, MD, last visit 07/25/16. 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 He believed it was related to childhood pertussis. Patient was felt stable from a pulmonary standpoint. Good hygiene and annual flu vaccine recommended. PRN pulmonary follow-up.   LABS: Labs reviewed: Acceptable for surgery. and Labs reviewed: Repeat PT/INR on the day of surgery. Still on Eliquis at that time of labs.  (all labs ordered are listed, but only abnormal results are displayed)  Labs Reviewed  BASIC METABOLIC PANEL - Abnormal; Notable for the following components:      Result Value   Glucose, Bld 119 (*)    All other components within normal limits  PROTIME-INR - Abnormal; Notable for the following components:   Prothrombin Time 17.1 (*)    All other components within normal limits  SURGICAL PCR  SCREEN  CBC    IMAGES: CXR 04/14/18: IMPRESSION: Cardiomegaly. No infiltrate or congestive heart failure. Aortic Atherosclerosis (ICD10-I70.0).   EKG: 04/16/18: SB at 53 bpm.    CV: Cardiac cath 04/15/18 (done due to abnormal 04/14/18 stress test showing partially reversible inferoseptal and apical defect):  Mid LAD-1 lesion is 30% stenosed.  Mid LAD-2 lesion is 65%  stenosed.  The left ventricular systolic function is normal.  LV end diastolic pressure is normal.  Ost LAD to Prox LAD lesion is 20% stenosed.  - Normal LV function with an ejection fraction of 55 to 60% without segmental wall motion abnormalities. - Single-vessel CAD with smooth 20% proximal LAD narrowing, smooth 30% narrowing immediately before and after the takeoff of the second diagonal vessel, and smooth 65% distal LAD stenosis with a small distal LAD; normal left circumflex system; and very large dominant RCA with minimal luminal irregularity without significant stenoses.  The vessel gives rise to a large PDA and PLA which supplies the entire inferior and posterior lateral wall extending to the apex. RECOMMENDATION: Medical therapy.  Low-dose nitrate therapy will be initiated.  Continue aggressive lipid-lowering therapy with high potency statin and target LDL less than 70.  Continue optimal treatment for hypertension. Recommend resumption of Eliquis tomorrow am.   Echo 04/15/18: Study Conclusions - Left ventricle: The cavity size was normal. Wall thickness was   increased in a pattern of moderate LVH. Systolic function was   normal. The estimated ejection fraction was in the range of 60%   to 65%. Wall motion was normal; there were no regional wall   motion abnormalities. Doppler parameters are consistent with   abnormal left ventricular relaxation (grade 1 diastolic   dysfunction). - Aorta: Aortic root dimension: 40 mm (ED). - Aortic root: The aortic root is mildly dilated. - Mitral valve: Mildly thickened leaflets . There was trivial   regurgitation. - Left atrium: The atrium was normal in size. - Inferior vena cava: The vessel was normal in size. The   respirophasic diameter changes were in the normal range (>= 50%),   consistent with normal central venous pressure. Impressions: - Compared to a prior study in 2015, the LVEF is higher at 60-65%.   The aortic root is dilated to  4.0 cm.  Event monitor 11/10/14-12/06/14: Average HR 71 BPM. Tachycardia present 4%, bradycardia 20%. Afib 3% with fastest episode 143 bpm and longest episode 03:34:44. No pauses noted > 3 seconds.  Carotid duplex 10/13/14:  Summary: - The vertebral arteries appear patent with antegrade flow. - Findings consistent with 1-39 percent cerebrovascular disease involving the right internal carotid artery and the left internalcarotid artery.    Past Medical History:  Diagnosis Date  . Arthritis   . CAD (coronary artery disease)    04/15/18 65% dLAD, 20% pLAD, 30% 2nd diag, Normal EF  . Dysrhythmia    afib  . Gallstones 07/1996  . Headache    couple times a year  . History of CT scan of head 1997   small vessel disease  . History of ETT 05/2002    no EKG changes  . History of MRI of cervical spine 07/15/2002  . Hypertension   . Lumbar spondylolysis 12/17/2016  . MGUS (monoclonal gammopathy of unknown significance) 04/11/2012  . Peripheral neuropathy   . Pneumonia 10/2014  . PONV (postoperative nausea and vomiting)    once many years ago  . Primary localized osteoarthrosis of left shoulder 12/25/2016  . Primary localized osteoarthrosis of right shoulder  11/05/2017  . Rectal bleeding 12/2002   colonoscopy  . Seizures ([place])    last seizure 1976  . Skin cancer   . Sleep apnea    uses CPAP  . Spinal stenosis, lumbar region with neurogenic claudication   . Stroke [facility])    ?tia ?afib  . TIA (transient ischemic attack)   . 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 ANTERIOR CERVICAL DECOMP/DISCECTOMY FUSION N/A 02/04/2015   Procedure: ANTERIOR CERVICAL DECOMPRESSION/DISCECTOMY FUSION CERVICAL FIVE-SIX,CERVICAL SIX-SEVEN;  Surgeon: Karie Chimera, MD;  Location: [facility];  Service: Neurosurgery;  Laterality: N/A;  . BACK SURGERY    . CATARACT EXTRACTION Bilateral   .  COLONOSCOPY W/ POLYPECTOMY  04/19/2003   tubular adenoma  . EYE SURGERY     Bilateral Cataracts  . GREAT TOE ARTHRODESIS, INTERPHALANGEAL JOINT Left 1978  . JOINT REPLACEMENT    . joint replacement on right thumb Right   . LEFT HEART CATH AND CORONARY ANGIOGRAPHY N/A 04/15/2018   Procedure: LEFT HEART CATH AND CORONARY ANGIOGRAPHY;  Surgeon: Troy Sine, MD;  Location: [facility];  Service: Cardiovascular;  Laterality: N/A;  . LUMBAR LAMINECTOMY/DECOMPRESSION MICRODISCECTOMY N/A 07/19/2017   Procedure: LAMINECTOMY AND FORAMINOTOMY  LUMBAR TWO- LUMBAR THREE, LUMBAR THREE- LUMBAR FOUR;  Surgeon: Ashok Pall, MD;  Location: [facility];  Service: Neurosurgery;  Laterality: N/A;  . nerve transfer surgery to lwft hand Left    Nov. 2017  . SHOULDER ARTHROSCOPY W/ ROTATOR CUFF REPAIR Left 13   rotator cuff   . SHOULDER INJECTION  June 2012   left, [place]  . SKIN CANCER EXCISION  07/1993   left lateral arm  . SUPERFICIAL KERATECTOMY Right 05/1998   wrist  . thumb injection  June 2012   left, [place]  . TONSILLECTOMY AND ADENOIDECTOMY    . TOTAL SHOULDER ARTHROPLASTY Left 12/25/2016   Procedure: TOTAL SHOULDER ARTHROPLASTY;  Surgeon: Marchia Bond, MD;  Location: [facility];  Service: Orthopedics;  Laterality: Left;  . TOTAL SHOULDER ARTHROPLASTY Right 11/05/2017  . TOTAL SHOULDER ARTHROPLASTY Right 11/05/2017   Procedure: TOTAL SHOULDER ARTHROPLASTY;  Surgeon: Marchia Bond, MD;  Location: [facility];  Service: Orthopedics;  Laterality: Right;  . ulnar nerve transfer  12/1999   left  . ULNAR NERVE TRANSPOSITION Left     MEDICATIONS: . amLODipine (NORVASC) 5 MG tablet  . aspirin EC 81 MG EC tablet  . atorvastatin (LIPITOR) 40 MG tablet  . ELIQUIS 5 MG TABS tablet  . isosorbide mononitrate (IMDUR) 30 MG 24 hr tablet  . labetalol (NORMODYNE) 200 MG tablet  . latanoprost (XALATAN) 0.005 % ophthalmic solution  . losartan (COZAAR) 50 MG tablet  . meclizine (ANTIVERT) 25 MG tablet  . Multiple  Vitamins-Minerals (PRESERVISION AREDS 2) CAPS  . nitroGLYCERIN (NITROSTAT) 0.4 MG SL tablet  . Polyethyl Glycol-Propyl Glycol (LUBRICANT EYE DROPS) 0.4-0.3 % SOLN  . SYSTANE BALANCE 0.6 % SOLN   No current facility-administered medications for this encounter.     Jose [name] [facility] Stay Center/Anesthesiology Phone [phone] 06/27/2018 2:39 PM

## 2018-07-02 ENCOUNTER — Telehealth: Payer: Self-pay

## 2018-07-02 ENCOUNTER — Other Ambulatory Visit: Payer: Self-pay | Admitting: Orthopedic Surgery

## 2018-07-02 NOTE — Telephone Encounter (Signed)
Patient with diagnosis of atrial fibrillation on Eliquis for anticoagulation.    Procedure: total left hip replacement Date of procedure: 07-08-18  CHADS2-VASc score of  6 (, HTN, AGE, , stroke/tia x 2, CAD, AGE, )  CrCl 100.2 Platelet count 182  Per office protocol, patient can hold Eliquis for 3 days prior to procedure.    If not bridging, patient should restart Eliquis as soon as possible due to history of TIA's  For orthopedic procedures please be sure to resume therapeutic (not prophylactic) dosing.

## 2018-07-02 NOTE — Telephone Encounter (Signed)
   Primary Cardiologist: Lauree Chandler, MD  Chart reviewed as part of pre-operative protocol coverage. Patient was contacted 07/02/2018 in reference to pre-operative risk assessment for pending surgery as outlined below.  Jose [name]. was last seen on 04/30/18 by Jose [name].  Since that day, Jose [name]. has done well. His chest discomfort has been improved significantly. He is getting about 5 METs of activity per DASI, mostly limited by hip pain.   Therefore, based on ACC/AHA guidelines, the patient would be at acceptable risk for the planned procedure without further cardiovascular testing.   Dr. Angelena Form is it okay to hold ASA and how long?  Will route pharmacy to review anticoagulation.   [place], [place] 07/02/2018, 3:05 PM

## 2018-07-02 NOTE — Telephone Encounter (Signed)
OK to hold ASA one week before his procedure.   Lauree Chandler

## 2018-07-02 NOTE — Telephone Encounter (Signed)
   [facility] Pre-operative Risk Assessment    Request for surgical clearance:  1. What type of surgery is being performed? Left total hip replacement  2. When is this surgery scheduled? 07/08/18  3. What type of clearance is required (medical clearance vs. Pharmacy clearance to hold med vs. Both)? Both  4. Are there any medications that need to be held prior to surgery and how long? Eliquis and Aspirin  5. Practice name and name of physician performing surgery? [facility]  6. What is your office phone number [phone]   7.   What is your office fax number [phone]  8.   Anesthesia type (None, local, MAC, general) ? Not listed   Kathyrn Lass 07/02/2018, 8:49 AM  _________________________________________________________________   (provider comments below)

## 2018-07-02 NOTE — Telephone Encounter (Signed)
I will route this recommendation to the requesting party via Epic fax function and remove from pre-op pool.  Please call with questions.  [place], [place] 07/02/2018, 4:20 PM

## 2018-07-03 ENCOUNTER — Other Ambulatory Visit: Payer: Self-pay | Admitting: Orthopedic Surgery

## 2018-07-03 NOTE — Care Plan (Signed)
Spoke with patient prior to surgery. He will discharge to home with family and the following arrangements:   DME:  [employer] to deliver RW to hospital room prior to discharge  HHPT: [facility]: [place] [facility] [address] - 07/21/18 @ 245  MD follow up:  Dr Mardelle Matte 07/21/18 @ 1100   Please contact [place], [place] [phone] with questions or if this plan should need to change   Thanks

## 2018-07-07 MED ORDER — TRANEXAMIC ACID 1000 MG/10ML IV SOLN
1000.0000 mg | INTRAVENOUS | Status: AC
  Administered 2018-07-08: 1000 mg via INTRAVENOUS
  Filled 2018-07-07: qty 1000

## 2018-07-07 NOTE — Anesthesia Preprocedure Evaluation (Addendum)
Anesthesia Evaluation  Patient identified by MRN, date of birth, ID band Patient awake    Reviewed: Allergy & Precautions, NPO status , Patient's Chart, lab work & pertinent test results  History of Anesthesia Complications (+) PONV  Airway Mallampati: III  TM Distance: >3 FB Neck ROM: Full    Dental no notable dental hx. (+) Teeth Intact, Dental Advisory Given   Pulmonary sleep apnea and Continuous Positive Airway Pressure Ventilation , former smoker,    Pulmonary exam normal breath sounds clear to auscultation       Cardiovascular Exercise Tolerance: Good hypertension, Pt. on medications + CAD  negative cardio ROS Normal cardiovascular exam+ dysrhythmias Atrial Fibrillation  Rhythm:Regular Rate:Normal  Echo 04/16/18 Left ventricle: The cavity size was normal. Wall thickness was   increased in a pattern of moderate LVH. Systolic function was   normal. The estimated ejection fraction was in the range of 60%   to 65%. Wall motion was normal; there were no regional wall   motion abnormalities. Doppler parameters are consistent with   abnormal left ventricular relaxation (grade 1 diastolic   dysfunction).   Neuro/Psych  Headaches, Seizures -, Well Controlled,  TIA Neuromuscular disease CVA negative psych ROS   GI/Hepatic negative GI ROS, Neg liver ROS,   Endo/Other  negative endocrine ROS  Renal/GU negative Renal ROS     Musculoskeletal  (+) Arthritis ,   Abdominal   Peds  Hematology   Anesthesia Other Findings   Reproductive/Obstetrics                            Anesthesia Physical Anesthesia Plan  ASA: IV  Anesthesia Plan: Spinal   Post-op Pain Management:    Induction:   PONV Risk Score and Plan: 2 and Treatment may vary due to age or medical condition  Airway Management Planned: Nasal Cannula and Natural Airway  Additional Equipment:   Intra-op Plan:   Post-operative  Plan:   Informed Consent: I have reviewed the patients History and Physical, chart, labs and discussed the procedure including the risks, benefits and alternatives for the proposed anesthesia with the patient or authorized representative who has indicated his/her understanding and acceptance.   Dental advisory given  Plan Discussed with: CRNA  Anesthesia Plan Comments: (If patient off elequis for > 72 hours then Spinal)       Anesthesia Quick Evaluation

## 2018-07-08 ENCOUNTER — Encounter (HOSPITAL_COMMUNITY): Payer: Self-pay | Admitting: Certified Registered"

## 2018-07-08 ENCOUNTER — Inpatient Hospital Stay (HOSPITAL_COMMUNITY): Payer: Medicare Other

## 2018-07-08 ENCOUNTER — Inpatient Hospital Stay (HOSPITAL_COMMUNITY)
Admission: RE | Admit: 2018-07-08 | Discharge: 2018-07-09 | DRG: 470 | Disposition: A | Discharge status: Home Health | Payer: Medicare Other | Attending: Orthopedic Surgery | Admitting: Orthopedic Surgery

## 2018-07-08 ENCOUNTER — Inpatient Hospital Stay (HOSPITAL_COMMUNITY): Payer: Medicare Other | Admitting: Vascular Surgery

## 2018-07-08 ENCOUNTER — Encounter (HOSPITAL_COMMUNITY)
Admission: RE | Disposition: A | Discharge status: Home Health | Payer: Self-pay | Source: Home / Self Care | Attending: Orthopedic Surgery

## 2018-07-08 DIAGNOSIS — Z85828 Personal history of other malignant neoplasm of skin: Secondary | ICD-10-CM

## 2018-07-08 DIAGNOSIS — I4891 Unspecified atrial fibrillation: Secondary | ICD-10-CM | POA: Diagnosis present

## 2018-07-08 DIAGNOSIS — I739 Peripheral vascular disease, unspecified: Secondary | ICD-10-CM | POA: Diagnosis present

## 2018-07-08 DIAGNOSIS — Z7901 Long term (current) use of anticoagulants: Secondary | ICD-10-CM | POA: Diagnosis not present

## 2018-07-08 DIAGNOSIS — Z87891 Personal history of nicotine dependence: Secondary | ICD-10-CM

## 2018-07-08 DIAGNOSIS — Z23 Encounter for immunization: Secondary | ICD-10-CM | POA: Diagnosis present

## 2018-07-08 DIAGNOSIS — I10 Essential (primary) hypertension: Secondary | ICD-10-CM | POA: Diagnosis present

## 2018-07-08 DIAGNOSIS — Z79899 Other long term (current) drug therapy: Secondary | ICD-10-CM

## 2018-07-08 DIAGNOSIS — Z7982 Long term (current) use of aspirin: Secondary | ICD-10-CM

## 2018-07-08 DIAGNOSIS — Z8673 Personal history of transient ischemic attack (TIA), and cerebral infarction without residual deficits: Secondary | ICD-10-CM

## 2018-07-08 DIAGNOSIS — M1612 Unilateral primary osteoarthritis, left hip: Principal | ICD-10-CM | POA: Diagnosis present

## 2018-07-08 DIAGNOSIS — G4730 Sleep apnea, unspecified: Secondary | ICD-10-CM | POA: Diagnosis present

## 2018-07-08 DIAGNOSIS — I2510 Atherosclerotic heart disease of native coronary artery without angina pectoris: Secondary | ICD-10-CM | POA: Diagnosis present

## 2018-07-08 DIAGNOSIS — M1610 Unilateral primary osteoarthritis, unspecified hip: Secondary | ICD-10-CM | POA: Diagnosis present

## 2018-07-08 DIAGNOSIS — Z96649 Presence of unspecified artificial hip joint: Secondary | ICD-10-CM

## 2018-07-08 HISTORY — DX: Unilateral primary osteoarthritis, left hip: M16.12

## 2018-07-08 HISTORY — PX: TOTAL HIP ARTHROPLASTY: SHX124

## 2018-07-08 LAB — PROTIME-INR
INR: 1.22
PROTHROMBIN TIME: 15.3 s — AB (ref 11.4–15.2)

## 2018-07-08 SURGERY — ARTHROPLASTY, HIP, TOTAL,POSTERIOR APPROACH
Anesthesia: Spinal | Site: Hip | Laterality: Left

## 2018-07-08 MED ORDER — HYDROCODONE-ACETAMINOPHEN 7.5-325 MG PO TABS: 1.0000 | ORAL_TABLET | ORAL | Status: DC | PRN

## 2018-07-08 MED ORDER — PROSIGHT PO TABS
1.0000 | ORAL_TABLET | Freq: Two times a day (BID) | ORAL | Status: DC
  Administered 2018-07-08 – 2018-07-09 (×3): 1 via ORAL
  Filled 2018-07-08 (×3): qty 1

## 2018-07-08 MED ORDER — METOCLOPRAMIDE HCL 5 MG/ML IJ SOLN: 5.0000 mg | Freq: Three times a day (TID) | INTRAMUSCULAR | Status: DC | PRN

## 2018-07-08 MED ORDER — CEFAZOLIN SODIUM-DEXTROSE 2-4 GM/100ML-% IV SOLN
2.0000 g | INTRAVENOUS | Status: AC
  Administered 2018-07-08: 2 g via INTRAVENOUS
  Filled 2018-07-08: qty 100

## 2018-07-08 MED ORDER — BUPIVACAINE HCL (PF) 0.25 % IJ SOLN
INTRAMUSCULAR | Status: AC
  Filled 2018-07-08: qty 30

## 2018-07-08 MED ORDER — ONDANSETRON HCL 4 MG/2ML IJ SOLN: 4.0000 mg | Freq: Four times a day (QID) | INTRAMUSCULAR | Status: DC | PRN

## 2018-07-08 MED ORDER — MENTHOL 3 MG MT LOZG: 1.0000 | LOZENGE | OROMUCOSAL | Status: DC | PRN

## 2018-07-08 MED ORDER — BACLOFEN 10 MG PO TABS: 10.0000 mg | ORAL_TABLET | Freq: Three times a day (TID) | ORAL | 0 refills | Status: DC

## 2018-07-08 MED ORDER — PRESERVISION AREDS 2 PO CAPS: 1.0000 | ORAL_CAPSULE | Freq: Two times a day (BID) | ORAL | Status: DC

## 2018-07-08 MED ORDER — SENNA-DOCUSATE SODIUM 8.6-50 MG PO TABS: 2.0000 | ORAL_TABLET | Freq: Every day | ORAL | 1 refills | Status: DC

## 2018-07-08 MED ORDER — POLYVINYL ALCOHOL 1.4 % OP SOLN
1.0000 [drp] | Freq: Every day | OPHTHALMIC | Status: DC
  Filled 2018-07-08: qty 15

## 2018-07-08 MED ORDER — AMLODIPINE BESYLATE 5 MG PO TABS
5.0000 mg | ORAL_TABLET | Freq: Every day | ORAL | Status: DC
  Administered 2018-07-09: 5 mg via ORAL
  Filled 2018-07-08: qty 1

## 2018-07-08 MED ORDER — ACETAMINOPHEN 500 MG PO TABS
500.0000 mg | ORAL_TABLET | Freq: Four times a day (QID) | ORAL | Status: AC
  Administered 2018-07-08 – 2018-07-09 (×4): 500 mg via ORAL
  Filled 2018-07-08 (×4): qty 1

## 2018-07-08 MED ORDER — LATANOPROST 0.005 % OP SOLN
1.0000 [drp] | Freq: Every day | OPHTHALMIC | Status: DC
  Administered 2018-07-08: 1 [drp] via OPHTHALMIC
  Filled 2018-07-08: qty 2.5

## 2018-07-08 MED ORDER — CEFAZOLIN SODIUM-DEXTROSE 2-4 GM/100ML-% IV SOLN
2.0000 g | Freq: Four times a day (QID) | INTRAVENOUS | Status: AC
  Administered 2018-07-08 – 2018-07-09 (×2): 2 g via INTRAVENOUS
  Filled 2018-07-08 (×2): qty 100

## 2018-07-08 MED ORDER — PHENOL 1.4 % MT LIQD: 1.0000 | OROMUCOSAL | Status: DC | PRN

## 2018-07-08 MED ORDER — ONDANSETRON HCL 4 MG/2ML IJ SOLN: 4.0000 mg | Freq: Once | INTRAMUSCULAR | Status: DC | PRN

## 2018-07-08 MED ORDER — DIPHENHYDRAMINE HCL 12.5 MG/5ML PO ELIX: 12.5000 mg | ORAL_SOLUTION | ORAL | Status: DC | PRN

## 2018-07-08 MED ORDER — ACETAMINOPHEN 10 MG/ML IV SOLN: 1000.0000 mg | Freq: Once | INTRAVENOUS | Status: DC | PRN

## 2018-07-08 MED ORDER — MECLIZINE HCL 25 MG PO TABS
25.0000 mg | ORAL_TABLET | Freq: Three times a day (TID) | ORAL | Status: DC | PRN
  Filled 2018-07-08: qty 1

## 2018-07-08 MED ORDER — PROPYLENE GLYCOL 0.6 % OP SOLN: 1.0000 [drp] | Freq: Four times a day (QID) | OPHTHALMIC | Status: DC

## 2018-07-08 MED ORDER — LACTATED RINGERS IV SOLN
INTRAVENOUS | Status: DC | PRN
  Administered 2018-07-08 (×2): via INTRAVENOUS

## 2018-07-08 MED ORDER — ALUM & MAG HYDROXIDE-SIMETH 200-200-20 MG/5ML PO SUSP: 30.0000 mL | ORAL | Status: DC | PRN

## 2018-07-08 MED ORDER — HYDROCODONE-ACETAMINOPHEN 10-325 MG PO TABS: 1.0000 | ORAL_TABLET | Freq: Four times a day (QID) | ORAL | 0 refills | Status: DC | PRN

## 2018-07-08 MED ORDER — PROPOFOL 10 MG/ML IV BOLUS
INTRAVENOUS | Status: AC
  Filled 2018-07-08: qty 20

## 2018-07-08 MED ORDER — APIXABAN 5 MG PO TABS
5.0000 mg | ORAL_TABLET | Freq: Two times a day (BID) | ORAL | Status: DC
  Administered 2018-07-09: 5 mg via ORAL
  Filled 2018-07-08: qty 1

## 2018-07-08 MED ORDER — POTASSIUM CHLORIDE IN NACL 20-0.45 MEQ/L-% IV SOLN
INTRAVENOUS | Status: DC
  Administered 2018-07-08: 17:00:00 via INTRAVENOUS
  Filled 2018-07-08 (×2): qty 1000

## 2018-07-08 MED ORDER — ATORVASTATIN CALCIUM 40 MG PO TABS
40.0000 mg | ORAL_TABLET | Freq: Every day | ORAL | Status: DC
  Administered 2018-07-08: 40 mg via ORAL
  Filled 2018-07-08: qty 1

## 2018-07-08 MED ORDER — METHOCARBAMOL 500 MG PO TABS: 500.0000 mg | ORAL_TABLET | Freq: Four times a day (QID) | ORAL | Status: DC | PRN

## 2018-07-08 MED ORDER — FENTANYL CITRATE (PF) 250 MCG/5ML IJ SOLN
INTRAMUSCULAR | Status: AC
  Filled 2018-07-08: qty 5

## 2018-07-08 MED ORDER — NITROGLYCERIN 0.4 MG SL SUBL: 0.4000 mg | SUBLINGUAL_TABLET | SUBLINGUAL | Status: DC | PRN

## 2018-07-08 MED ORDER — ACETAMINOPHEN 500 MG PO TABS
1000.0000 mg | ORAL_TABLET | Freq: Once | ORAL | Status: AC
  Administered 2018-07-08: 1000 mg via ORAL
  Filled 2018-07-08: qty 2

## 2018-07-08 MED ORDER — KETOROLAC TROMETHAMINE 15 MG/ML IJ SOLN
7.5000 mg | Freq: Four times a day (QID) | INTRAMUSCULAR | Status: AC
  Administered 2018-07-08 – 2018-07-09 (×3): 7.5 mg via INTRAVENOUS
  Filled 2018-07-08 (×3): qty 1

## 2018-07-08 MED ORDER — ZOLPIDEM TARTRATE 5 MG PO TABS: 5.0000 mg | ORAL_TABLET | Freq: Every evening | ORAL | Status: DC | PRN

## 2018-07-08 MED ORDER — 0.9 % SODIUM CHLORIDE (POUR BTL) OPTIME
TOPICAL | Status: DC | PRN
  Administered 2018-07-08 (×2): 1000 mL

## 2018-07-08 MED ORDER — DEXAMETHASONE SODIUM PHOSPHATE 10 MG/ML IJ SOLN
8.0000 mg | Freq: Once | INTRAMUSCULAR | Status: AC
  Administered 2018-07-08: 8 mg via INTRAVENOUS
  Filled 2018-07-08: qty 1

## 2018-07-08 MED ORDER — MORPHINE SULFATE (PF) 2 MG/ML IV SOLN: 0.5000 mg | INTRAVENOUS | Status: DC | PRN

## 2018-07-08 MED ORDER — ONDANSETRON HCL 4 MG PO TABS: 4.0000 mg | ORAL_TABLET | Freq: Four times a day (QID) | ORAL | Status: DC | PRN

## 2018-07-08 MED ORDER — MAGNESIUM CITRATE PO SOLN: 1.0000 | Freq: Once | ORAL | Status: DC | PRN

## 2018-07-08 MED ORDER — POLYETHYLENE GLYCOL 3350 17 G PO PACK: 17.0000 g | PACK | Freq: Every day | ORAL | Status: DC | PRN

## 2018-07-08 MED ORDER — ACETAMINOPHEN 325 MG PO TABS: 325.0000 mg | ORAL_TABLET | Freq: Four times a day (QID) | ORAL | Status: DC | PRN

## 2018-07-08 MED ORDER — METHOCARBAMOL 1000 MG/10ML IJ SOLN
500.0000 mg | Freq: Four times a day (QID) | INTRAVENOUS | Status: DC | PRN
  Filled 2018-07-08: qty 5

## 2018-07-08 MED ORDER — POLYVINYL ALCOHOL 1.4 % OP SOLN
1.0000 [drp] | Freq: Four times a day (QID) | OPHTHALMIC | Status: DC
  Administered 2018-07-08 – 2018-07-09 (×4): 1 [drp] via OPHTHALMIC
  Filled 2018-07-08: qty 15

## 2018-07-08 MED ORDER — PROPOFOL 500 MG/50ML IV EMUL
INTRAVENOUS | Status: DC | PRN
  Administered 2018-07-08: 50 ug/kg/min via INTRAVENOUS

## 2018-07-08 MED ORDER — DOCUSATE SODIUM 100 MG PO CAPS
100.0000 mg | ORAL_CAPSULE | Freq: Two times a day (BID) | ORAL | Status: DC
  Administered 2018-07-08 – 2018-07-09 (×3): 100 mg via ORAL
  Filled 2018-07-08 (×2): qty 1

## 2018-07-08 MED ORDER — DEXAMETHASONE SODIUM PHOSPHATE 10 MG/ML IJ SOLN
10.0000 mg | Freq: Once | INTRAMUSCULAR | Status: AC
  Administered 2018-07-09: 10 mg via INTRAVENOUS
  Filled 2018-07-08: qty 1

## 2018-07-08 MED ORDER — GABAPENTIN 300 MG PO CAPS
300.0000 mg | ORAL_CAPSULE | Freq: Once | ORAL | Status: AC
  Administered 2018-07-08: 300 mg via ORAL
  Filled 2018-07-08: qty 1

## 2018-07-08 MED ORDER — LABETALOL HCL 100 MG PO TABS
100.0000 mg | ORAL_TABLET | Freq: Two times a day (BID) | ORAL | Status: DC
  Administered 2018-07-08: 100 mg via ORAL
  Filled 2018-07-08 (×3): qty 1

## 2018-07-08 MED ORDER — TRANEXAMIC ACID 1000 MG/10ML IV SOLN
1000.0000 mg | Freq: Once | INTRAVENOUS | Status: AC
  Administered 2018-07-08: 1000 mg via INTRAVENOUS
  Filled 2018-07-08: qty 10

## 2018-07-08 MED ORDER — BUPIVACAINE IN DEXTROSE 0.75-8.25 % IT SOLN
INTRATHECAL | Status: DC | PRN
  Administered 2018-07-08: 2 mL via INTRATHECAL

## 2018-07-08 MED ORDER — ONDANSETRON HCL 4 MG PO TABS: 4.0000 mg | ORAL_TABLET | Freq: Three times a day (TID) | ORAL | 0 refills | Status: DC | PRN

## 2018-07-08 MED ORDER — HYDROCODONE-ACETAMINOPHEN 5-325 MG PO TABS: 1.0000 | ORAL_TABLET | ORAL | Status: DC | PRN

## 2018-07-08 MED ORDER — FENTANYL CITRATE (PF) 100 MCG/2ML IJ SOLN: 25.0000 ug | INTRAMUSCULAR | Status: DC | PRN

## 2018-07-08 MED ORDER — POLYETHYL GLYCOL-PROPYL GLYCOL 0.4-0.3 % OP SOLN: 1.0000 [drp] | Freq: Every day | OPHTHALMIC | Status: DC

## 2018-07-08 MED ORDER — LOSARTAN POTASSIUM 50 MG PO TABS
50.0000 mg | ORAL_TABLET | Freq: Every day | ORAL | Status: DC
  Administered 2018-07-09: 50 mg via ORAL
  Filled 2018-07-08: qty 1

## 2018-07-08 MED ORDER — ISOSORBIDE MONONITRATE ER 30 MG PO TB24
30.0000 mg | ORAL_TABLET | Freq: Two times a day (BID) | ORAL | Status: DC
  Administered 2018-07-08 – 2018-07-09 (×2): 30 mg via ORAL
  Filled 2018-07-08 (×2): qty 1

## 2018-07-08 MED ORDER — METOCLOPRAMIDE HCL 5 MG PO TABS: 5.0000 mg | ORAL_TABLET | Freq: Three times a day (TID) | ORAL | Status: DC | PRN

## 2018-07-08 MED ORDER — BISACODYL 10 MG RE SUPP: 10.0000 mg | Freq: Every day | RECTAL | Status: DC | PRN

## 2018-07-08 MED ORDER — SODIUM CHLORIDE 0.9 % IV SOLN
INTRAVENOUS | Status: DC | PRN
  Administered 2018-07-08: 15 ug/min via INTRAVENOUS

## 2018-07-08 MED ORDER — BUPIVACAINE HCL (PF) 0.25 % IJ SOLN
INTRAMUSCULAR | Status: DC | PRN
  Administered 2018-07-08: 20 mL

## 2018-07-08 MED FILL — BACLOFEN 10 MG TABLET: 10 | 17 days supply | Qty: 50 | Fill #0

## 2018-07-08 MED FILL — HYDROCODON-APAP 10-325: 10-325 | 7 days supply | Qty: 28 | Fill #0

## 2018-07-08 MED FILL — ONDANSETRON HCL 4 MG TABLET: 4 | 3 days supply | Qty: 10 | Fill #0

## 2018-07-08 MED FILL — STOOL SOFTENER-LAXATIVE TAB: 50-8.6 | 50 days supply | Qty: 100 | Fill #0

## 2018-07-08 SURGICAL SUPPLY — 59 items
BALL HIP ARTICU EZE 36 8.5 (Hips) ×1 IMPLANT
BIT DRILL 5/64X5 DISP (BIT) ×2 IMPLANT
BLADE SAW SGTL 73X25 THK (BLADE) ×2 IMPLANT
CLSR STERI-STRIP ANTIMIC 1/2X4 (GAUZE/BANDAGES/DRESSINGS) ×2 IMPLANT
COVER SURGICAL LIGHT HANDLE (MISCELLANEOUS) ×2 IMPLANT
CUP ACET PNNCL SECTR W/GRIP 56 (Hips) ×1 IMPLANT
DECANTER SPIKE VIAL GLASS SM (MISCELLANEOUS) ×2 IMPLANT
DRAPE INCISE IOBAN 66X45 STRL (DRAPES) ×2 IMPLANT
DRAPE ORTHO SPLIT 77X108 STRL (DRAPES) ×2
DRAPE SURG ORHT 6 SPLT 77X108 (DRAPES) ×2 IMPLANT
DRAPE U-SHAPE 47X51 STRL (DRAPES) ×2 IMPLANT
DRSG MEPILEX BORDER 4X12 (GAUZE/BANDAGES/DRESSINGS) IMPLANT
DRSG MEPILEX BORDER 4X8 (GAUZE/BANDAGES/DRESSINGS) ×2 IMPLANT
DURAPREP 26ML APPLICATOR (WOUND CARE) ×4 IMPLANT
ELECT BLADE 4.0 EZ CLEAN MEGAD (MISCELLANEOUS) ×2
ELECT CAUTERY BLADE 6.4 (BLADE) ×2 IMPLANT
ELECT REM PT RETURN 9FT ADLT (ELECTROSURGICAL) ×2
ELECTRODE BLDE 4.0 EZ CLN MEGD (MISCELLANEOUS) ×1 IMPLANT
ELECTRODE REM PT RTRN 9FT ADLT (ELECTROSURGICAL) ×1 IMPLANT
ELIMINATOR HOLE APEX DEPUY (Hips) ×2 IMPLANT
GLOVE BIOGEL PI ORTHO PRO SZ8 (GLOVE) ×1
GLOVE ECLIPSE 7.0 STRL STRAW (GLOVE) ×2 IMPLANT
GLOVE ORTHO TXT STRL SZ7.5 (GLOVE) ×2 IMPLANT
GLOVE PI ORTHO PRO STRL SZ8 (GLOVE) ×1 IMPLANT
GLOVE SURG ORTHO 8.0 STRL STRW (GLOVE) ×2 IMPLANT
GOWN STRL REUS W/ TWL XL LVL3 (GOWN DISPOSABLE) ×1 IMPLANT
GOWN STRL REUS W/TWL 2XL LVL3 (GOWN DISPOSABLE) ×2 IMPLANT
GOWN STRL REUS W/TWL XL LVL3 (GOWN DISPOSABLE) ×1
HIP BALL ARTICU EZE 36 8.5 (Hips) ×2 IMPLANT
HOOD PEEL AWAY FACE SHEILD DIS (HOOD) ×2 IMPLANT
HOOD PEEL AWAY FLYTE STAYCOOL (MISCELLANEOUS) ×2 IMPLANT
KIT BASIN OR (CUSTOM PROCEDURE TRAY) ×2 IMPLANT
KIT TURNOVER KIT B (KITS) ×2 IMPLANT
MANIFOLD NEPTUNE II (INSTRUMENTS) ×2 IMPLANT
NDL SAFETY ECLIPSE 18X1.5 (NEEDLE) ×1 IMPLANT
NEEDLE 22X1 1/2 (OR ONLY) (NEEDLE) ×2 IMPLANT
NEEDLE HYPO 18GX1.5 SHARP (NEEDLE) ×1
NS IRRIG 1000ML POUR BTL (IV SOLUTION) ×2 IMPLANT
PACK TOTAL JOINT (CUSTOM PROCEDURE TRAY) ×2 IMPLANT
PAD ARMBOARD 7.5X6 YLW CONV (MISCELLANEOUS) ×4 IMPLANT
PINN SECTOR W/GRIP ACE CUP 56 (Hips) ×2 IMPLANT
PINNACLE ALTRX PLUS 4 N 36X56 (Hips) ×2 IMPLANT
PRESSURIZER FEMORAL UNIV (MISCELLANEOUS) IMPLANT
RETRIEVER SUT HEWSON (MISCELLANEOUS) ×2 IMPLANT
SCREW 6.5MMX25MM (Screw) ×2 IMPLANT
STEM OFFSET DUOFIX SZ6 STD (Stem) ×2 IMPLANT
SUCTION FRAZIER HANDLE 10FR (MISCELLANEOUS) ×1
SUCTION TUBE FRAZIER 10FR DISP (MISCELLANEOUS) ×1 IMPLANT
SUT FIBERWIRE #2 38 REV NDL BL (SUTURE) ×6
SUT VIC AB 0 CT1 27 (SUTURE) ×1
SUT VIC AB 0 CT1 27XBRD ANBCTR (SUTURE) ×1 IMPLANT
SUT VIC AB 2-0 CT1 27 (SUTURE) ×1
SUT VIC AB 2-0 CT1 TAPERPNT 27 (SUTURE) ×1 IMPLANT
SUT VIC AB 3-0 SH 8-18 (SUTURE) ×2 IMPLANT
SUTURE FIBERWR#2 38 REV NDL BL (SUTURE) ×3 IMPLANT
SYR BULB IRRIGATION 50ML (SYRINGE) ×2 IMPLANT
SYR CONTROL 10ML LL (SYRINGE) ×2 IMPLANT
TOWEL OR 17X26 10 PK STRL BLUE (TOWEL DISPOSABLE) ×2 IMPLANT
TRAY CATH 16FR W/PLASTIC CATH (SET/KITS/TRAYS/PACK) IMPLANT

## 2018-07-08 NOTE — Transfer of Care (Signed)
Immediate Anesthesia Transfer of Care Note  Patient: Jose [name].  Procedure(s) Performed: LEFT TOTAL HIP ARTHROPLASTY (Left Hip)  Patient Location: PACU  Anesthesia Type:MAC and Spinal  Level of Consciousness: lethargic and responds to stimulation  Airway & Oxygen Therapy: Patient Spontanous Breathing and Patient connected to face mask oxygen  Post-op Assessment: Report given to RN  Post vital signs: Reviewed and stable  Last Vitals:  Vitals Value Taken Time  BP    Temp    Pulse 52 07/08/2018  9:58 AM  Resp 14 07/08/2018  9:58 AM  SpO2 95 % 07/08/2018  9:58 AM  Vitals shown include unvalidated device data.  Last Pain:  Vitals:   07/08/18 0620  TempSrc:   PainSc: 4          Complications: No apparent anesthesia complications

## 2018-07-08 NOTE — Op Note (Signed)
07/08/2018  9:32 AM  PATIENT:  Jose [name].   MRN: [id number]  PRE-OPERATIVE DIAGNOSIS: Left hip primary localized osteoarthritis  POST-OPERATIVE DIAGNOSIS:  same  PROCEDURE:  Procedure(s): LEFT TOTAL HIP ARTHROPLASTY  PREOPERATIVE INDICATIONS:    Jose [name]. is an 82 y.o. male who has a diagnosis of left hip primary localized osteoarthritis and elected for surgical management after failing conservative treatment.  The risks benefits and alternatives were discussed with the patient including but not limited to the risks of nonoperative treatment, versus surgical intervention including infection, bleeding, nerve injury, periprosthetic fracture, the need for revision surgery, dislocation, leg length discrepancy, blood clots, cardiopulmonary complications, morbidity, mortality, among others, and they were willing to proceed.     OPERATIVE REPORT     SURGEON:  Marchia Bond, MD    ASSISTANT:  Joya Gaskins, OPA-C  (Present throughout the entire procedure,  necessary for completion of procedure in a timely manner, assisting with retraction, instrumentation, and closure)     ANESTHESIA: Spinal  ESTIMATED BLOOD LOSS: 973 mL    COMPLICATIONS:  None.     UNIQUE ASPECTS OF THE CASE: Femoral neck was fairly long.  The acetabulum was also fairly deep.  I had a fair amount of bone medial, which I did not end up taking because I had good front to back fill.  This left the superior aspect of the cup a little bit uncovered, but excellent matching overall position.  Because I did not medialize very much I did not end up using a high offset, although initially with the +1.5 he was still short and unstable and I ended up trialing with a 5 which was better, but the 8.5 restored leg lengths the best, may be even slightly longer, but I needed the length for stability.  There were beads posteriorly, and I did not want to proceed with a lipped liner.  COMPONENTS:  Depuy [employer] fit  femur size 6 with a 36 mm + 8.5 metallic head ball and a Gription Acetabular shell size 56, with a single cancellous screw for backup fixation, with an apex hole eliminator and a +4 neutral polyethylene liner.    PROCEDURE IN DETAIL:   The patient was met in the holding area and  identified.  The appropriate hip was identified and marked at the operative site.  The patient was then transported to the OR  and  placed under spinal anesthesia.  At that point, the patient was  placed in the lateral decubitus position with the operative side up and  secured to the operating room table and all bony prominences padded.     The operative lower extremity was prepped from the iliac crest to the distal leg.  Sterile draping was performed.  Time out was performed prior to incision.      A routine posterolateral approach was utilized via sharp dissection  carried down to the subcutaneous tissue.  Gross bleeders were Bovie coagulated.  The iliotibial band was identified and incised along the length of the skin incision.  Self-retaining retractors were  inserted.  With the hip internally rotated, the short external rotators  were identified. The piriformis and capsule was tagged with FiberWire, and the hip capsule released in a T-type fashion.  The femoral neck was exposed, and I resected the femoral neck using the appropriate jig. This was performed at approximately a thumb's breadth above the lesser trochanter.    I then exposed the deep acetabulum, cleared out any  tissue including the ligamentum teres.  A wing retractor was placed.  After adequate visualization, I excised the labrum, and then sequentially reamed.  I placed the trial acetabulum, which seated nicely, and then impacted the real cup into place.  Appropriate version and inclination was confirmed clinically matching their bony anatomy, and also with the use of the jig.  I placed a cancellous screw to augment fixation.  A trial polyethylene liner was  placed and the wing retractor removed.    I then prepared the proximal femur using the cookie-cutter, the lateralizing reamer, and then sequentially reamed and broached.  A trial broach, neck, and head was utilized, and I reduced the hip and it was found to have excellent stability with functional range of motion. The trial components were then removed, and the real polyethylene liner was placed.  I then impacted the real femoral prosthesis into place into the appropriate version, slightly anteverted to the normal anatomy, and I impacted the real head ball into place. The hip was then reduced and taken through functional range of motion and found to have excellent stability. Leg lengths were restored.  I then used a 2 mm drill bits to pass the FiberWire suture from the capsule and piriformis through the greater trochanter, and secured this. Excellent posterior capsular repair was achieved. I also closed the T in the capsule.  I then irrigated the hip copiously again with pulse lavage, and repaired the fascia with Vicryl, followed by Vicryl for the subcutaneous tissue, Monocryl for the skin, Steri-Strips and sterile gauze. The wounds were injected. The patient was then awakened and returned to PACU in stable and satisfactory condition. There were no complications.  Marchia Bond, MD Orthopedic Surgeon [phone]   07/08/2018 9:32 AM

## 2018-07-08 NOTE — Anesthesia Procedure Notes (Signed)
Spinal  Patient location during procedure: OR Start time: 07/08/2018 7:38 AM End time: 07/08/2018 7:46 AM Staffing Anesthesiologist: Barnet Glasgow, MD Performed: anesthesiologist  Preanesthetic Checklist Completed: patient identified, surgical consent, pre-op evaluation, timeout performed, IV checked, risks and benefits discussed and monitors and equipment checked Spinal Block Patient position: sitting Prep: site prepped and draped and DuraPrep Patient monitoring: heart rate, cardiac monitor, continuous pulse ox and blood pressure Approach: midline Location: L2-3 Injection technique: single-shot Needle Needle type: Pencan  Needle gauge: 24 G Needle length: 10 cm Needle insertion depth: 6 cm Assessment Sensory level: T4 Additional Notes 2 attempts pt tolerated procedure well

## 2018-07-08 NOTE — Anesthesia Procedure Notes (Signed)
Procedure Name: MAC Date/Time: 07/08/2018 7:50 AM Performed by: Barrington Ellison, CRNA Pre-anesthesia Checklist: Patient identified, Emergency Drugs available, Suction available, Patient being monitored and Timeout performed Patient Re-evaluated:Patient Re-evaluated prior to induction Oxygen Delivery Method: Simple face mask

## 2018-07-08 NOTE — H&P (Signed)
PREOPERATIVE H&P  Chief Complaint: left hip pain  HPI: Jose [name]. is a 82 y.o. male who presents for preoperative history and physical with a diagnosis of left hip oa. Symptoms are rated as moderate to severe, and have been worsening.  This is significantly impairing activities of daily living.  He has elected for surgical management.   He has failed injections, activity modification, anti-inflammatories, and assistive devices.  Preoperative X-rays demonstrate end stage degenerative changes with osteophyte formation, loss of joint space, subchondral sclerosis.   Past Medical History:  Diagnosis Date  . Arthritis   . CAD (coronary artery disease)    04/15/18 65% dLAD, 20% pLAD, 30% 2nd diag, Normal EF  . Dysrhythmia    afib  . Gallstones 07/1996  . Headache    couple times a year  . History of CT scan of head 1997   small vessel disease  . History of ETT 05/2002    no EKG changes  . History of MRI of cervical spine 07/15/2002  . Hypertension   . Lumbar spondylolysis 12/17/2016  . MGUS (monoclonal gammopathy of unknown significance) 04/11/2012  . Peripheral neuropathy   . Pneumonia 10/2014  . PONV (postoperative nausea and vomiting)    once many years ago  . Primary localized osteoarthrosis of left shoulder 12/25/2016  . Primary localized osteoarthrosis of right shoulder 11/05/2017  . Rectal bleeding 12/2002   colonoscopy  . Seizures ([place])    last seizure 1976  . Skin cancer   . Sleep apnea    uses CPAP  . Spinal stenosis, lumbar region with neurogenic claudication   . Stroke [facility])    ?tia ?afib  . TIA (transient ischemic attack)   . 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 ANTERIOR CERVICAL DECOMP/DISCECTOMY FUSION N/A 02/04/2015   Procedure: ANTERIOR CERVICAL DECOMPRESSION/DISCECTOMY FUSION CERVICAL FIVE-SIX,CERVICAL SIX-SEVEN;  Surgeon: Karie Chimera, MD;  Location: [place] NEURO ORS;  Service: Neurosurgery;  Laterality: N/A;  . BACK SURGERY    . CATARACT EXTRACTION Bilateral   . COLONOSCOPY W/ POLYPECTOMY  04/19/2003   tubular adenoma  . EYE SURGERY     Bilateral Cataracts  . GREAT TOE ARTHRODESIS, INTERPHALANGEAL JOINT Left 1978  . JOINT REPLACEMENT    . joint replacement on right thumb Right   . LEFT HEART CATH AND CORONARY ANGIOGRAPHY N/A 04/15/2018   Procedure: LEFT HEART CATH AND CORONARY ANGIOGRAPHY;  Surgeon: Troy Sine, MD;  Location: [place] CV LAB;  Service: Cardiovascular;  Laterality: N/A;  . LUMBAR LAMINECTOMY/DECOMPRESSION MICRODISCECTOMY N/A 07/19/2017   Procedure: LAMINECTOMY AND FORAMINOTOMY  LUMBAR TWO- LUMBAR THREE, LUMBAR THREE- LUMBAR FOUR;  Surgeon: Ashok Pall, MD;  Location: [place];  Service: Neurosurgery;  Laterality: N/A;  . nerve transfer surgery to lwft hand Left    Nov. 2017  . SHOULDER ARTHROSCOPY W/ ROTATOR CUFF REPAIR Left 15   rotator cuff   . SHOULDER INJECTION  June 2012   left, [place]  . SKIN CANCER EXCISION  07/1993   left lateral arm  . SUPERFICIAL KERATECTOMY Right 05/1998   wrist  . thumb injection  June 2012   left, [place]  . TONSILLECTOMY AND ADENOIDECTOMY    . TOTAL SHOULDER ARTHROPLASTY Left 12/25/2016   Procedure: TOTAL SHOULDER ARTHROPLASTY;  Surgeon: Marchia Bond, MD;  Location: [place];  Service: Orthopedics;  Laterality: Left;  . TOTAL SHOULDER  ARTHROPLASTY Right 11/05/2017  . TOTAL SHOULDER ARTHROPLASTY Right 11/05/2017   Procedure: TOTAL SHOULDER ARTHROPLASTY;  Surgeon: Marchia Bond, MD;  Location: [place];  Service: Orthopedics;  Laterality: Right;  . ulnar nerve transfer  12/1999   left  . ULNAR NERVE TRANSPOSITION Left    Social History   Socioeconomic History  . Marital status: Married    Spouse name: [name]  . Number of children: 2  . Years of education: 75  . Highest education level: Not on file  Occupational History  . Occupation: retired     Fish farm manager: UNEMPLOYED  Social Needs  . Financial resource strain: Not on file  . Food insecurity:    Worry: Not on file    Inability: Not on file  . Transportation needs:    Medical: Not on file    Non-medical: Not on file  Tobacco Use  . Smoking status: Former Smoker    Packs/day: 2.00    Years: 13.00    Pack years: 26.00    Types: Cigarettes    Last attempt to quit: 03/05/1968    Years since quitting: 50.3  . Smokeless tobacco: Never Used  Substance and Sexual Activity  . Alcohol use: Yes    Alcohol/week: 2.0 standard drinks    Types: 2 Glasses of wine per week  . Drug use: No  . Sexual activity: Not Currently  Lifestyle  . Physical activity:    Days per week: Not on file    Minutes per session: Not on file  . Stress: Not on file  Relationships  . Social connections:    Talks on phone: Not on file    Gets together: Not on file    Attends religious service: Not on file    Active member of club or organization: Not on file    Attends meetings of clubs or organizations: Not on file    Relationship status: Not on file  Other Topics Concern  . Not on file  Social History Narrative   Lives with 4th wife, [name], married 2010   Retired, organizational psychology   [employer], [place], [name], 2 children   Daughter, Lester, in [place], 2 children   Patient is right handed   Patient drinks approximately 3-4 cups of caffeine daily   Family History  Problem Relation Age of Onset  . Dementia Mother 2       vascular  . Transient ischemic attack Mother   . Dementia Father        after hip fracture  . Hypertension Sister        skin cancer  . Seizures Neg Hx    Allergies  Allergen Reactions  . Maprotiline Other (See Comments)    TETRACYCLIC ANTIDEPRESSANTS UNSPECIFIED REACTION (patient is unaware of allergy) TETRACYCLIC ANTIDEPRESSANTS UNSPECIFIED REACTION (patient is unaware of allergy)  . Tetracycline Hcl Itching and Rash    Rash on hands    Prior to Admission medications   Medication Sig Start Date End Date Taking? Authorizing Provider  amLODipine (NORVASC) 5 MG tablet Take 5 mg by mouth daily.   Yes [provider]  aspirin EC 81 MG EC tablet Take 1 tablet (81 mg total) by mouth daily. 04/17/18  Yes Cheryln Manly, NP  atorvastatin (LIPITOR) 40 MG tablet Take 1 tablet (40 mg total) by mouth daily. 06/25/18 09/23/18 Yes [name], Brittainy M, PA-C  ELIQUIS 5 MG TABS tablet TAKE 1 TABLET TWICE A DAY Patient taking differently:  Take 5 mg by mouth 2 (two) times daily.  04/21/18  Yes Kathrynn Ducking, MD  isosorbide mononitrate (IMDUR) 30 MG 24 hr tablet Take 1 tablet (30 mg total) by mouth 2 (two) times daily. 05/13/18  Yes Burnell Blanks, MD  labetalol (NORMODYNE) 200 MG tablet Take 100 mg by mouth 2 (two) times daily.   Yes [provider]  latanoprost (XALATAN) 0.005 % ophthalmic solution Place 1 drop into the right eye at bedtime. 08/01/17  Yes [provider]  losartan (COZAAR) 50 MG tablet Take 50 mg by mouth daily. 03/30/18  Yes [provider]  Multiple Vitamins-Minerals (PRESERVISION AREDS 2) CAPS Take 1 capsule by mouth 2 (two) times daily.    Yes [provider]  Polyethyl Glycol-Propyl Glycol (LUBRICANT EYE DROPS) 0.4-0.3 % SOLN Place 1 drop into the right eye at bedtime.   Yes [provider]  SYSTANE BALANCE 0.6 % SOLN Place 1 drop into both eyes 4 (four) times daily.   Yes [provider]  meclizine (ANTIVERT) 25 MG tablet Take 1 tablet (25 mg total) by mouth 3 (three) times daily as needed. Patient taking differently: Take 25 mg by mouth 3 (three) times daily as needed for dizziness.  02/22/16   Tanna Furry, MD  nitroGLYCERIN (NITROSTAT) 0.4 MG SL tablet Place 1 tablet (0.4 mg total) under the tongue every 5 (five) minutes x 3 doses as needed for chest pain. 04/16/18   Cheryln Manly, NP     Positive ROS: All other systems have been reviewed and  were otherwise negative with the exception of those mentioned in the HPI and as above.  Physical Exam: General: Alert, no acute distress Cardiovascular: No pedal edema Respiratory: No cyanosis, no use of accessory musculature GI: No organomegaly, abdomen is soft and non-tender Skin: No lesions in the area of chief complaint Neurologic: Sensation intact distally Psychiatric: Patient is competent for consent with normal mood and affect Lymphatic: No axillary or cervical lymphadenopathy  MUSCULOSKELETAL: left hip painful arc of motion  Assessment: Left hip OA   Plan: Plan for Procedure(s): LEFT TOTAL HIP ARTHROPLASTY  The risks benefits and alternatives were discussed with the patient including but not limited to the risks of nonoperative treatment, versus surgical intervention including infection, bleeding, nerve injury, periprosthetic fracture, the need for revision surgery, dislocation, leg length discrepancy, blood clots, cardiopulmonary complications, morbidity, mortality, among others, and they were willing to proceed.     Preoperative templating of the joint replacement has been completed, documented, and submitted to the Operating Room personnel in order to optimize intra-operative equipment management.  Johnny Bridge, MD Cell [phone]   07/08/2018 7:27 AM

## 2018-07-08 NOTE — Evaluation (Signed)
Physical Therapy Evaluation Patient Details Name: Jose [name]. MRN: [id number] DOB: 16-Nov-1935 Today's Date: 07/08/2018   History of Present Illness  Pt is an 82 y/o male s/p elective L THA, posterior approach. PMH includes CAD, HTN, a fib, OSA on CPAP, s/p ACDF, and s/p R TSA.   Clinical Impression  Pt is s/p surgery above with deficits below. Pt requiring min guard A for ambulation using RW. Educated about hip precautions and verbally reviewed supine HEP. Will continue to follow acutely to maximize functional mobility independence and safety.     Follow Up Recommendations Follow surgeon's recommendation for DC plan and follow-up therapies;Supervision for mobility/OOB    Equipment Recommendations  Rolling walker with 5" wheels;3in1 (PT)    Recommendations for Other Services OT consult     Precautions / Restrictions Precautions Precautions: Posterior Hip Precaution Booklet Issued: Yes (comment) Precaution Comments: Reviewed posterior hip precautions and verbally reviewed supine HEP.  Restrictions Weight Bearing Restrictions: Yes LLE Weight Bearing: Weight bearing as tolerated      Mobility  Bed Mobility Overal bed mobility: Needs Assistance Bed Mobility: Supine to Sit     Supine to sit: Supervision     General bed mobility comments: Supervision for safety.   Transfers Overall transfer level: Needs assistance Equipment used: Rolling walker (2 wheeled) Transfers: Sit to/from Stand Sit to Stand: Min guard         General transfer comment: Min guard for safety. Verbal cues to kick LLE out to maintain hip precautions. Verbal cues for safe hand placement.   Ambulation/Gait Ambulation/Gait assistance: Min guard Gait Distance (Feet): 75 Feet Assistive device: Rolling walker (2 wheeled) Gait Pattern/deviations: Step-to pattern;Decreased step length - right;Decreased step length - left;Decreased weight shift to left;Antalgic Gait velocity: Decreased    General  Gait Details: Slow, antalgic gait. Verbal cues to maintain hip precautions, especially avoiding IR on LLE during turns. Required standing rest X2 secondary to fatigue in UEs.   Stairs            Wheelchair Mobility    Modified Rankin (Stroke Patients Only)       Balance Overall balance assessment: Needs assistance Sitting-balance support: No upper extremity supported;Feet supported Sitting balance-Leahy Scale: Good     Standing balance support: Bilateral upper extremity supported;During functional activity Standing balance-Leahy Scale: Poor Standing balance comment: Reliant on BUE support                              Pertinent Vitals/Pain Pain Assessment: 0-10 Pain Score: 3  Pain Location: L hip  Pain Descriptors / Indicators: Aching;Operative site guarding Pain Intervention(s): Limited activity within patient's tolerance;Monitored during session;Repositioned    Home Living Family/patient expects to be discharged to:: Private residence Living Arrangements: Spouse/significant other Available Help at Discharge: Family;Available 24 hours/day Type of Home: House Home Access: Stairs to enter Entrance Stairs-Rails: None Entrance Stairs-Number of Steps: 1(threshold step ) Home Layout: One level Home Equipment: Cane - single point      Prior Function Level of Independence: Independent               Hand Dominance   Dominant Hand: Right    Extremity/Trunk Assessment   Upper Extremity Assessment Upper Extremity Assessment: Defer to OT evaluation    Lower Extremity Assessment Lower Extremity Assessment: LLE deficits/detail LLE Deficits / Details: Deficits consistent with post op pain and weakness.     Cervical / Trunk Assessment Cervical / Trunk  Assessment: Normal  Communication   Communication: No difficulties  Cognition Arousal/Alertness: Awake/alert Behavior During Therapy: WFL for tasks assessed/performed Overall Cognitive Status:  Within Functional Limits for tasks assessed                                        General Comments      Exercises     Assessment/Plan    PT Assessment Patient needs continued PT services  PT Problem List Decreased strength;Decreased balance;Decreased range of motion;Decreased mobility;Decreased knowledge of use of DME;Decreased knowledge of precautions;Pain       PT Treatment Interventions DME instruction;Gait training;Functional mobility training;Therapeutic activities;Therapeutic exercise;Stair training;Balance training;Patient/family education    PT Goals (Current goals can be found in the Care Plan section)  Acute Rehab PT Goals Patient Stated Goal: to go home  PT Goal Formulation: With patient Time For Goal Achievement: 07/22/18 Potential to Achieve Goals: Fair    Frequency 7X/week   Barriers to discharge        Co-evaluation               AM-PAC PT "6 Clicks" Daily Activity  Outcome Measure Difficulty turning over in bed (including adjusting bedclothes, sheets and blankets)?: A Little Difficulty moving from lying on back to sitting on the side of the bed? : A Little Difficulty sitting down on and standing up from a chair with arms (e.g., wheelchair, bedside commode, etc,.)?: Unable Help needed moving to and from a bed to chair (including a wheelchair)?: A Little Help needed walking in hospital room?: A Little Help needed climbing 3-5 steps with a railing? : A Lot 6 Click Score: 15    End of Session Equipment Utilized During Treatment: Gait belt Activity Tolerance: Patient tolerated treatment well Patient left: in chair;with call bell/phone within reach Nurse Communication: Mobility status PT Visit Diagnosis: Other abnormalities of gait and mobility (R26.89);Muscle weakness (generalized) (M62.81);Pain Pain - Right/Left: Left Pain - part of body: Hip    Time: 5859-2924 PT Time Calculation (min) (ACUTE ONLY): 24 min   Charges:   PT  Evaluation $PT Eval Low Complexity: 1 Low PT Treatments $Gait Training: 8-22 mins        Leighton Ruff, PT, DPT  Acute Rehabilitation Services  Pager: [phone] Office: [phone]   Rudean Hitt 07/08/2018, 4:46 PM

## 2018-07-08 NOTE — Anesthesia Postprocedure Evaluation (Signed)
Anesthesia Post Note  Patient: Jose [name].  Procedure(s) Performed: LEFT TOTAL HIP ARTHROPLASTY (Left Hip)     Patient location during evaluation: PACU Anesthesia Type: Spinal Level of consciousness: oriented and awake and alert Pain management: pain level controlled Vital Signs Assessment: post-procedure vital signs reviewed and stable Respiratory status: spontaneous breathing, respiratory function stable and patient connected to nasal cannula oxygen Cardiovascular status: blood pressure returned to baseline and stable Postop Assessment: no headache, no backache and no apparent nausea or vomiting Anesthetic complications: no    Last Vitals:  Vitals:   07/08/18 1230 07/08/18 1313  BP: 126/77 133/73  Pulse: (!) 45 (!) 49  Resp: 16 16  Temp:  (!) 36.4 C  SpO2: 99% 98%    Last Pain:  Vitals:   07/08/18 1313  TempSrc: Oral  PainSc: 3                  Barnet Glasgow

## 2018-07-08 NOTE — Progress Notes (Signed)
Dr. Valma Cava at bedside and made aware of pts low heart rate in the 40s. Pts blood pressure and mentation are WDL. No new orders given. Will continue to monitor.

## 2018-07-08 NOTE — Anesthesia Procedure Notes (Deleted)
Spinal

## 2018-07-08 NOTE — Plan of Care (Signed)
  Problem: Activity: Goal: Risk for activity intolerance will decrease Outcome: Progressing   Problem: Coping: Goal: Level of anxiety will decrease Outcome: Progressing   Problem: Elimination: Goal: Will not experience complications related to bowel motility Outcome: Progressing   Problem: Pain Managment: Goal: General experience of comfort will improve Outcome: Progressing   Problem: Safety: Goal: Ability to remain free from injury will improve Outcome: Progressing   Problem: Safety: Goal: Ability to remain free from injury will improve Outcome: Progressing   Problem: Skin Integrity: Goal: Risk for impaired skin integrity will decrease Outcome: Progressing

## 2018-07-08 NOTE — Progress Notes (Signed)
Report given to lisa rn as caregiver

## 2018-07-08 NOTE — Care Plan (Signed)
Spoke with patient prior to surgery. He will discharge to home with family and the following arrangements:   DME:  [employer] to deliver RW to hospital room prior to discharge  HHPT: [facility]: [place] on [address] - 07/21/18 @ 245  MD follow up:  Dr Mardelle Matte 07/21/18 @ 1100   Please contact [place], [place] [phone] with questions or if this plan should need to change   Thanks

## 2018-07-08 NOTE — Discharge Instructions (Signed)
INSTRUCTIONS AFTER JOINT REPLACEMENT  ° °o Remove items at home which could result in a fall. This includes throw rugs or furniture in walking pathways °o ICE to the affected joint every three hours while awake for 30 minutes at a time, for at least the first 3-5 days, and then as needed for pain and swelling.  Continue to use ice for pain and swelling. You may notice swelling that will progress down to the foot and ankle.  This is normal after surgery.  Elevate your leg when you are not up walking on it.   °o Continue to use the breathing machine you got in the hospital (incentive spirometer) which will help keep your temperature down.  It is common for your temperature to cycle up and down following surgery, especially at night when you are not up moving around and exerting yourself.  The breathing machine keeps your lungs expanded and your temperature down. ° ° °DIET:  As you were doing prior to hospitalization, we recommend a well-balanced diet. ° °DRESSING / WOUND CARE / SHOWERING ° °You may change your dressing 3-5 days after surgery.  Then change the dressing every day with sterile gauze.  Please use good hand washing techniques before changing the dressing.  Do not use any lotions or creams on the incision until instructed by your surgeon. ° °ACTIVITY ° °o Increase activity slowly as tolerated, but follow the weight bearing instructions below.   °o No driving for 6 weeks or until further direction given by your physician.  You cannot drive while taking narcotics.  °o No lifting or carrying greater than 10 lbs. until further directed by your surgeon. °o Avoid periods of inactivity such as sitting longer than an hour when not asleep. This helps prevent blood clots.  °o You may return to work once you are authorized by your doctor.  ° ° ° °WEIGHT BEARING  ° °Weight bearing as tolerated with assist device (walker, cane, etc) as directed, use it as long as suggested by your surgeon or therapist, typically at  least 4-6 weeks. ° ° °EXERCISES ° °Results after joint replacement surgery are often greatly improved when you follow the exercise, range of motion and muscle strengthening exercises prescribed by your doctor. Safety measures are also important to protect the joint from further injury. Any time any of these exercises cause you to have increased pain or swelling, decrease what you are doing until you are comfortable again and then slowly increase them. If you have problems or questions, call your caregiver or physical therapist for advice.  ° °Rehabilitation is important following a joint replacement. After just a few days of immobilization, the muscles of the leg can become weakened and shrink (atrophy).  These exercises are designed to build up the tone and strength of the thigh and leg muscles and to improve motion. Often times heat used for twenty to thirty minutes before working out will loosen up your tissues and help with improving the range of motion but do not use heat for the first two weeks following surgery (sometimes heat can increase post-operative swelling).  ° °These exercises can be done on a training (exercise) mat, on the floor, on a table or on a bed. Use whatever works the best and is most comfortable for you.    Use music or television while you are exercising so that the exercises are a pleasant break in your day. This will make your life better with the exercises acting as a break   in your routine that you can look forward to.   Perform all exercises about fifteen times, three times per day or as directed.  You should exercise both the operative leg and the other leg as well. ° °Exercises include: °  °• Quad Sets - Tighten up the muscle on the front of the thigh (Quad) and hold for 5-10 seconds.   °• Straight Leg Raises - With your knee straight (if you were given a brace, keep it on), lift the leg to 60 degrees, hold for 3 seconds, and slowly lower the leg.  Perform this exercise against  resistance later as your leg gets stronger.  °• Leg Slides: Lying on your back, slowly slide your foot toward your buttocks, bending your knee up off the floor (only go as far as is comfortable). Then slowly slide your foot back down until your leg is flat on the floor again.  °• Angel Wings: Lying on your back spread your legs to the side as far apart as you can without causing discomfort.  °• Hamstring Strength:  Lying on your back, push your heel against the floor with your leg straight by tightening up the muscles of your buttocks.  Repeat, but this time bend your knee to a comfortable angle, and push your heel against the floor.  You may put a pillow under the heel to make it more comfortable if necessary.  ° °A rehabilitation program following joint replacement surgery can speed recovery and prevent re-injury in the future due to weakened muscles. Contact your doctor or a physical therapist for more information on knee rehabilitation.  ° ° °CONSTIPATION ° °Constipation is defined medically as fewer than three stools per week and severe constipation as less than one stool per week.  Even if you have a regular bowel pattern at home, your normal regimen is likely to be disrupted due to multiple reasons following surgery.  Combination of anesthesia, postoperative narcotics, change in appetite and fluid intake all can affect your bowels.  ° °YOU MUST use at least one of the following options; they are listed in order of increasing strength to get the job done.  They are all available over the counter, and you may need to use some, POSSIBLY even all of these options:   ° °Drink plenty of fluids (prune juice may be helpful) and high fiber foods °Colace 100 mg by mouth twice a day  °Senokot for constipation as directed and as needed Dulcolax (bisacodyl), take with full glass of water  °Miralax (polyethylene glycol) once or twice a day as needed. ° °If you have tried all these things and are unable to have a bowel  movement in the first 3-4 days after surgery call either your surgeon or your primary doctor.   ° °If you experience loose stools or diarrhea, hold the medications until you stool forms back up.  If your symptoms do not get better within 1 week or if they get worse, check with your doctor.  If you experience "the worst abdominal pain ever" or develop nausea or vomiting, please contact the office immediately for further recommendations for treatment. ° ° °ITCHING:  If you experience itching with your medications, try taking only a single pain pill, or even half a pain pill at a time.  You can also use Benadryl over the counter for itching or also to help with sleep.  ° °TED HOSE STOCKINGS:  Use stockings on both legs until for at least 2 weeks or as   directed by physician office. They may be removed at night for sleeping.  MEDICATIONS:  See your medication summary on the After Visit Summary that nursing will review with you.  You may have some home medications which will be placed on hold until you complete the course of blood thinner medication.  It is important for you to complete the blood thinner medication as prescribed.  PRECAUTIONS:  If you experience chest pain or shortness of breath - call 911 immediately for transfer to the hospital emergency department.   If you develop a fever greater that 101 F, purulent drainage from wound, increased redness or drainage from wound, foul odor from the wound/dressing, or calf pain - CONTACT YOUR SURGEON.                                                   FOLLOW-UP APPOINTMENTS:  If you do not already have a post-op appointment, please call the office for an appointment to be seen by your surgeon.  Guidelines for how soon to be seen are listed in your After Visit Summary, but are typically between 1-4 weeks after surgery.  MAKE SURE YOU:   Understand these instructions.   Get help right away if you are not doing well or get worse.    Thank you for  letting us be a part of your medical care team.  It is a privilege we respect greatly.  We hope these instructions will help you stay on track for a fast and full recovery!   Information on my medicine - ELIQUIS (apixaban)  This medication education was reviewed with me or my healthcare representative as part of my discharge preparation. Why was Eliquis prescribed for you? Eliquis was prescribed for you to reduce the risk of a blood clot forming that can cause a stroke if you have a medical condition called atrial fibrillation (a type of irregular heartbeat).  What do You need to know about Eliquis ? Take your Eliquis TWICE DAILY - one tablet in the morning and one tablet in the evening with or without food. If you have difficulty swallowing the tablet whole please discuss with your pharmacist how to take the medication safely.  Take Eliquis exactly as prescribed by your doctor and DO NOT stop taking Eliquis without talking to the doctor who prescribed the medication.  Stopping may increase your risk of developing a stroke.  Refill your prescription before you run out.  After discharge, you should have regular check-up appointments with your healthcare provider that is prescribing your Eliquis.  In the future your dose may need to be changed if your kidney function or weight changes by a significant amount or as you get older.  What do you do if you miss a dose? If you miss a dose, take it as soon as you remember on the same day and resume taking twice daily.  Do not take more than one dose of ELIQUIS at the same time to make up a missed dose.  Important Safety Information A possible side effect of Eliquis is bleeding. You should call your healthcare provider right away if you experience any of the following: ? Bleeding from an injury or your nose that does not stop. ? Unusual colored urine (red or dark brown) or unusual colored stools (red or black). ? Unusual bruising for unknown  reasons. ? A serious fall or if you hit your head (even if there is no bleeding).  Some medicines may interact with Eliquis and might increase your risk of bleeding or clotting while on Eliquis. To help avoid this, consult your healthcare provider or pharmacist prior to using any new prescription or non-prescription medications, including herbals, vitamins, non-steroidal anti-inflammatory drugs (NSAIDs) and supplements.  This website has more information on Eliquis (apixaban): http://www.eliquis.com/eliquis/home

## 2018-07-09 ENCOUNTER — Other Ambulatory Visit: Payer: Self-pay

## 2018-07-09 ENCOUNTER — Encounter (HOSPITAL_COMMUNITY): Payer: Self-pay | Admitting: General Practice

## 2018-07-09 LAB — BASIC METABOLIC PANEL
Anion gap: 8 (ref 5–15)
BUN: 18 mg/dL (ref 8–23)
CO2: 24 mmol/L (ref 22–32)
Calcium: 8.6 mg/dL — ABNORMAL LOW (ref 8.9–10.3)
Chloride: 108 mmol/L (ref 98–111)
Creatinine, Ser: 0.82 mg/dL (ref 0.61–1.24)
Glucose, Bld: 141 mg/dL — ABNORMAL HIGH (ref 70–99)
POTASSIUM: 3.7 mmol/L (ref 3.5–5.1)
SODIUM: 140 mmol/L (ref 135–145)

## 2018-07-09 LAB — CBC
HCT: 33.7 % — ABNORMAL LOW (ref 39.0–52.0)
HEMOGLOBIN: 11.2 g/dL — AB (ref 13.0–17.0)
MCH: 32 pg (ref 26.0–34.0)
MCHC: 33.2 g/dL (ref 30.0–36.0)
MCV: 96.3 fL (ref 78.0–100.0)
PLATELETS: 163 10*3/uL (ref 150–400)
RBC: 3.5 MIL/uL — AB (ref 4.22–5.81)
RDW: 13.2 % (ref 11.5–15.5)
WBC: 11.7 10*3/uL — ABNORMAL HIGH (ref 4.0–10.5)

## 2018-07-09 MED ORDER — INFLUENZA VAC SPLIT HIGH-DOSE 0.5 ML IM SUSY
0.5000 mL | PREFILLED_SYRINGE | INTRAMUSCULAR | Status: AC
  Administered 2018-07-09: 0.5 mL via INTRAMUSCULAR
  Filled 2018-07-09: qty 0.5

## 2018-07-09 NOTE — Progress Notes (Signed)
     Subjective:  Patient reports pain as mild.  Doing well, although hasn't gotten out of bed much.  Objective:   VITALS:   Vitals:   07/08/18 1313 07/08/18 2049 07/09/18 0011 07/09/18 0217  BP: 133/73 130/78 122/63 124/76  Pulse: (!) 49 81 78 79  Resp: 16 16 18 18   Temp: (!) 97.5 F (36.4 C)  98.6 F (37 C) 98 F (36.7 C)  TempSrc: Oral  Oral   SpO2: 98% 97% 96% 95%  Weight:        Neurologically intact Dorsiflexion/Plantar flexion intact Incision: no drainage   Lab Results  Component Value Date   WBC 11.7 (H) 07/09/2018   HGB 11.2 (L) 07/09/2018   HCT 33.7 (L) 07/09/2018   MCV 96.3 07/09/2018   PLT 163 07/09/2018   BMET    Component Value Date/Time   NA 140 07/09/2018 0439   K 3.7 07/09/2018 0439   CL 108 07/09/2018 0439   CO2 24 07/09/2018 0439   GLUCOSE 141 (H) 07/09/2018 0439   BUN 18 07/09/2018 0439   CREATININE 0.82 07/09/2018 0439   CALCIUM 8.6 (L) 07/09/2018 0439   GFRNONAA >60 07/09/2018 0439   GFRAA >60 07/09/2018 0439     Assessment/Plan: 1 Day Post-Op   Principal Problem:   Primary localized osteoarthritis of left hip Active Problems:   Primary localized osteoarthritis of hip   Advance diet Up with therapy  Inpatient only procedure.     Jose [name] 07/09/2018, 7:56 AM   Marchia Bond, MD Cell [phone]

## 2018-07-09 NOTE — Progress Notes (Signed)
Provided discharge education/instructions, all questions and concerns addressed, Pt not in distress, discharged home with belongings accompanied by wife. 

## 2018-07-09 NOTE — Evaluation (Signed)
Occupational Therapy Evaluation Patient Details Name: Jose [name]. MRN: [id number] DOB: 21-Apr-1936 Today's Date: 07/09/2018    History of Present Illness Pt is an 82 y/o male s/p elective L THA, posterior approach. PMH includes CAD, HTN, a fib, OSA on CPAP, s/p ACDF, and s/p R TSA.    Clinical Impression   Pt was assessed for OT and then participated in ADL retraining session to include LB bathing/dressing using A/E, simulated shower transfer and all possible equipment following posterior THA LLE. He is currently Mod I to Min guard assist for ADL's. He verbalized that he plans to purchase ADL Kit and will also have PRN A from his wife. He plans to d/c home later today if able. He denies any further acute OT needs at this time, will sign off.    Follow Up Recommendations  Follow surgeon's recommendation for DC plan and follow-up therapies    Equipment Recommendations  3 in 1 bedside commode;Other (comment)(Total Hip Kit recommended/A/E)    Recommendations for Other Services       Precautions / Restrictions Precautions Precautions: Posterior Hip Precaution Booklet Issued: Yes (comment) Precaution Comments: Reviewed posterior hip precautions and verbally reviewed supine HEP.  Restrictions Weight Bearing Restrictions: Yes RLE Weight Bearing: Weight bearing as tolerated LLE Weight Bearing: Weight bearing as tolerated      Mobility Bed Mobility Overal bed mobility: Needs Assistance Bed Mobility: Supine to Sit     Supine to sit: Modified independent (Device/Increase time)        Transfers Overall transfer level: Needs assistance Equipment used: Rolling walker (2 wheeled) Transfers: Sit to/from Stand Sit to Stand: Min guard         General transfer comment: Min guard for safety. Verbal cues to kick LLE out to maintain hip precautions. Verbal cues for safe hand placement.     Balance Overall balance assessment: Needs assistance Sitting-balance support: No upper  extremity supported;Feet supported Sitting balance-Leahy Scale: Good     Standing balance support: During functional activity;Single extremity supported;No upper extremity supported Standing balance-Leahy Scale: Fair Standing balance comment: While standing at sink for grooming, ocassionally placed 1 hand on counter top to steady.                           ADL either performed or assessed with clinical judgement   ADL Overall ADL's : Needs assistance/impaired Eating/Feeding: Independent;Sitting   Grooming: Wash/dry hands;Wash/dry face;Oral care;Min guard;Standing Grooming Details (indicate cue type and reason): Stood at sink for Grooming  Upper Body Bathing: Sitting;Modified independent;Set up   Lower Body Bathing: Set up;Minimal assistance;Sit to/from stand;With adaptive equipment Lower Body Bathing Details (indicate cue type and reason): Pt was educated in use of LH Sponge and 3:1 in shower stall at home. He verbalized understanding Upper Body Dressing : Independent;Sitting   Lower Body Dressing: Minimal assistance;With adaptive equipment;Sit to/from stand   Toilet Transfer: Engineer, structural Details (indicate cue type and reason): 3:1 over toilet in bathroom. Pt was noted to maintain THP LLE Toileting- Clothing Manipulation and Hygiene: Sit to/from stand;Supervision/safety;Adhering to hip precautions   Tub/ Shower Transfer: Walk-in shower;3 in [employer] walker;Ambulation(Simulated in pt room)   Functional mobility during ADLs: Supervision/safety General ADL Comments: Pt was assessed for OT and then participated in ADL retraining session to include LB bathing/dressing using A/E, simulated shower transfer and all possible equipment following posterior THA LLE. He verbalized that he plans to purchase ADL Kit and will also have PRN  A from his wife. He plans to d/c home later today if able. He denies any further acute OT needs at this time,  will sign off.     Vision Baseline Vision/History: Wears glasses Patient Visual Report: No change from baseline Vision Assessment?: No apparent visual deficits     Perception     Praxis      Pertinent Vitals/Pain Pain Assessment: 0-10 Pain Score: 4  Pain Location: L hip  Pain Descriptors / Indicators: Aching;Operative site guarding Pain Intervention(s): Limited activity within patient's tolerance;Monitored during session     Hand Dominance Right   Extremity/Trunk Assessment Upper Extremity Assessment Upper Extremity Assessment: Generalized weakness;LUE deficits/detail LUE Deficits / Details: Note: Paresthesias in left hand affecting FM control. Old UN Transposition surgery when in the [employer] and has long standing UN problems per pt report. LUE Sensation: history of peripheral neuropathy LUE Coordination: decreased fine motor   Lower Extremity Assessment Lower Extremity Assessment: Defer to PT evaluation   Cervical / Trunk Assessment Cervical / Trunk Assessment: Normal   Communication Communication Communication: No difficulties   Cognition Arousal/Alertness: Awake/alert Behavior During Therapy: WFL for tasks assessed/performed Overall Cognitive Status: Within Functional Limits for tasks assessed                                     General Comments       Exercises     Shoulder Instructions      Home Living Family/patient expects to be discharged to:: Private residence Living Arrangements: Spouse/significant other Available Help at Discharge: Family;Available 24 hours/day Type of Home: House Home Access: Stairs to enter [employer] of Steps: 1 Entrance Stairs-Rails: None Home Layout: One level     Bathroom Shower/Tub: Occupational psychologist: Handicapped height Bathroom Accessibility: Yes How Accessible: Accessible via walker Home Equipment: [place] - single point          Prior Functioning/Environment Level of  Independence: Independent                 OT Problem List: Pain      OT Treatment/Interventions:      OT Goals(Current goals can be found in the care plan section) Acute Rehab OT Goals Patient Stated Goal: Go home today if I can OT Goal Formulation: All assessment and education complete, DC therapy  OT Frequency:     Barriers to D/C:            Co-evaluation              AM-PAC PT "6 Clicks" Daily Activity     Outcome Measure Help from another person eating meals?: None Help from another person taking care of personal grooming?: A Little Help from another person toileting, which includes using toliet, bedpan, or urinal?: A Little Help from another person bathing (including washing, rinsing, drying)?: A Little Help from another person to put on and taking off regular upper body clothing?: None Help from another person to put on and taking off regular lower body clothing?: A Little 6 Click Score: 20   End of Session Equipment Utilized During Treatment: Rolling walker Nurse Communication: Mobility status;Other (comment)(Pt requested prune juice)  Activity Tolerance: Patient tolerated treatment well Patient left: in chair;with call bell/phone within reach;with nursing/sitter in room  OT Visit Diagnosis: Pain Pain - Right/Left: Left Pain - part of body: Hip  Time: 0812-0910 OT Time Calculation (min): 58 min Charges:  OT General Charges $OT Visit: 1 Visit OT Evaluation $OT Eval Low Complexity: 1 Low OT Treatments $Self Care/Home Management : 23-37 mins   Conny Situ Beth Dixon, OTR/L 07/09/2018, 9:31 AM

## 2018-07-09 NOTE — Care Management Note (Signed)
Case Management Note  Patient Details  Name: Jose [name]. MRN: [id number] Date of Birth: [birth year]  Subjective/Objective:    Left THA                Action/Plan:   DME: [employer] to deliver RW to hospital room prior to discharge  HHPT: [facility] OPPT: [facility] [place] - 07/21/18 @ 245  MD follow up: Dr Mardelle Matte 07/21/18 @ 1100    Expected Discharge Date:                  Expected Discharge Plan:  [employer]  In-House Referral:  NA  Discharge planning Services  CM Consult  Post Acute Care Choice:  Home Health Choice offered to:  Patient  DME Arranged:  Other see comment DME Agency:  NA  HH Arranged:  PT [place] Agency:  [facility] ([facility]  Status of Service:  Completed, signed off  If discussed at [employer] of Stay Meetings, dates discussed:    Additional Comments:  Erenest Rasher, RN 07/09/2018, 9:59 AM

## 2018-07-09 NOTE — Plan of Care (Signed)

## 2018-07-09 NOTE — Progress Notes (Signed)
Physical Therapy Treatment Patient Details Name: Jose [name]. MRN: [id number] DOB: 10/26/1936 Today's Date: 07/09/2018    History of Present Illness Pt is an 82 y/o male s/p elective L THA, posterior approach. PMH includes CAD, HTN, a fib, OSA on CPAP, s/p ACDF, and s/p R TSA.     PT Comments    Pt making good progress with functional mobility. Reviewed HEP with pt throughout session. Discussed a generalized walking program with pt to initiate upon d/c. Pt would continue to benefit from skilled physical therapy services at this time while admitted and after d/c to address the below listed limitations in order to improve overall safety and independence with functional mobility.    Follow Up Recommendations  Home health PT     Equipment Recommendations  Rolling walker with 5" wheels;3in1 (PT)    Recommendations for Other Services       Precautions / Restrictions Precautions Precautions: Posterior Hip Precaution Comments: pt able to recall 3/3 precautions Restrictions Weight Bearing Restrictions: Yes LLE Weight Bearing: Weight bearing as tolerated    Mobility  Bed Mobility               General bed mobility comments: pt OOB in recliner chair upon arrival  Transfers Overall transfer level: Needs assistance Equipment used: Rolling walker (2 wheeled) Transfers: Sit to/from Stand Sit to Stand: Supervision         General transfer comment: supervision for safety  Ambulation/Gait             General Gait Details: focus of session was on HEP   Stairs             Wheelchair Mobility    Modified Rankin (Stroke Patients Only)       Balance Overall balance assessment: Needs assistance Sitting-balance support: No upper extremity supported;Feet supported Sitting balance-Leahy Scale: Good     Standing balance support: During functional activity;Single extremity supported;Bilateral upper extremity supported Standing balance-Leahy Scale: Poor                               Cognition Arousal/Alertness: Awake/alert Behavior During Therapy: WFL for tasks assessed/performed Overall Cognitive Status: Within Functional Limits for tasks assessed                                        Exercises Total Joint Exercises Ankle Circles/Pumps: AROM;Both;20 reps;Seated Heel Slides: AROM;Strengthening;Left;10 reps;Seated Hip ABduction/ADduction: AROM;Strengthening;Left;10 reps;Standing Long Arc Quad: AROM;Strengthening;Left;10 reps;Seated Knee Flexion: AROM;Strengthening;Left;10 reps;Standing Marching in Standing: AROM;Strengthening;10 reps;Standing;Both Standing Hip Extension: AROM;Strengthening;Left;10 reps;Standing    General Comments        Pertinent Vitals/Pain Pain Assessment: No/denies pain    Home Living                      Prior Function            PT Goals (current goals can now be found in the care plan section) Acute Rehab PT Goals PT Goal Formulation: With patient Time For Goal Achievement: 07/22/18 Potential to Achieve Goals: Good Progress towards PT goals: Progressing toward goals    Frequency    7X/week      PT Plan Current plan remains appropriate    Co-evaluation              AM-PAC PT "6 Clicks" Daily Activity  Outcome Measure  Difficulty turning over in bed (including adjusting bedclothes, sheets and blankets)?: None Difficulty moving from lying on back to sitting on the side of the bed? : None Difficulty sitting down on and standing up from a chair with arms (e.g., wheelchair, bedside commode, etc,.)?: Unable Help needed moving to and from a bed to chair (including a wheelchair)?: None Help needed walking in hospital room?: A Little Help needed climbing 3-5 steps with a railing? : A Little 6 Click Score: 19    End of Session   Activity Tolerance: Patient tolerated treatment well Patient left: in chair;with call bell/phone within reach;with  family/visitor present Nurse Communication: Mobility status PT Visit Diagnosis: Other abnormalities of gait and mobility (R26.89);Muscle weakness (generalized) (M62.81);Pain Pain - Right/Left: Left Pain - part of body: Hip     Time: 5038-8828 PT Time Calculation (min) (ACUTE ONLY): 15 min  Charges:  $Therapeutic Exercise: 8-22 mins                     Sherie Don, PT, DPT  Acute Rehabilitation Services Pager [phone] Office [place] 07/09/2018, 5:46 PM

## 2018-07-09 NOTE — Progress Notes (Signed)
Physical Therapy Treatment Patient Details Name: Jose [name]. MRN: [id number] DOB: [birth year] Today's Date: 07/09/2018    History of Present Illness Pt is an 82 y/o male s/p elective L THA, posterior approach. PMH includes CAD, HTN, a fib, OSA on CPAP, s/p ACDF, and s/p R TSA.     PT Comments    Pt making steady progress with functional mobility and successfully completed stair training this session. Pt would continue to benefit from skilled physical therapy services at this time while admitted and after d/c to address the below listed limitations in order to improve overall safety and independence with functional mobility.    Follow Up Recommendations  Home health PT     Equipment Recommendations  Rolling walker with 5" wheels;3in1 (PT)    Recommendations for Other Services       Precautions / Restrictions Precautions Precautions: Posterior Hip Precaution Comments: pt able to recall 3/3 precautions Restrictions Weight Bearing Restrictions: Yes RLE Weight Bearing: Weight bearing as tolerated LLE Weight Bearing: Weight bearing as tolerated    Mobility  Bed Mobility Overal bed mobility: Needs Assistance Bed Mobility: Supine to Sit     Supine to sit: Modified independent (Device/Increase time)     General bed mobility comments: pt OOB in recliner chair upon arrival  Transfers Overall transfer level: Needs assistance Equipment used: Rolling walker (2 wheeled) Transfers: Sit to/from Stand Sit to Stand: Min guard         General transfer comment: min guard for safety, good technique  Ambulation/Gait Ambulation/Gait assistance: Min guard Gait Distance (Feet): 200 Feet Assistive device: Rolling walker (2 wheeled) Gait Pattern/deviations: Step-to pattern;Decreased step length - right;Decreased step length - left;Decreased weight shift to left;Step-through pattern Gait velocity: Decreased  Gait velocity interpretation: 1.31 - 2.62 ft/sec, indicative of limited  community ambulator General Gait Details: pt with improved gait pattern this session, steady with RW   Stairs Stairs: Yes Stairs assistance: Min guard Stair Management: No rails;Step to pattern;Backwards;With walker Number of Stairs: 1(x2 trials) General stair comments: cueing for sequencing and technique, min guar for safety   Wheelchair Mobility    Modified Rankin (Stroke Patients Only)       Balance Overall balance assessment: Needs assistance Sitting-balance support: No upper extremity supported;Feet supported Sitting balance-Leahy Scale: Good     Standing balance support: During functional activity;Single extremity supported;Bilateral upper extremity supported Standing balance-Leahy Scale: Poor Standing balance comment: While standing at sink for grooming, ocassionally placed 1 hand on counter top to steady.                            Cognition Arousal/Alertness: Awake/alert Behavior During Therapy: WFL for tasks assessed/performed Overall Cognitive Status: Within Functional Limits for tasks assessed                                        Exercises      General Comments        Pertinent Vitals/Pain Pain Assessment: Faces Faces Pain Scale: Hurts a little bit Pain Location: L hip  Pain Descriptors / Indicators: Aching;Operative site guarding Pain Intervention(s): Monitored during session;Repositioned    Home Living                      Prior Function            PT Goals (  current goals can now be found in the care plan section) Acute Rehab PT Goals Patient Stated Goal: Go home today if I can PT Goal Formulation: With patient Time For Goal Achievement: 07/22/18 Potential to Achieve Goals: Good Progress towards PT goals: Progressing toward goals    Frequency    7X/week      PT Plan Current plan remains appropriate    Co-evaluation              AM-PAC PT "6 Clicks" Daily Activity  Outcome Measure   Difficulty turning over in bed (including adjusting bedclothes, sheets and blankets)?: None Difficulty moving from lying on back to sitting on the side of the bed? : None Difficulty sitting down on and standing up from a chair with arms (e.g., wheelchair, bedside commode, etc,.)?: Unable Help needed moving to and from a bed to chair (including a wheelchair)?: None Help needed walking in hospital room?: A Little Help needed climbing 3-5 steps with a railing? : A Little 6 Click Score: 19    End of Session Equipment Utilized During Treatment: Gait belt Activity Tolerance: Patient tolerated treatment well Patient left: in chair;with call bell/phone within reach Nurse Communication: Mobility status PT Visit Diagnosis: Other abnormalities of gait and mobility (R26.89);Muscle weakness (generalized) (M62.81);Pain Pain - Right/Left: Left Pain - part of body: Hip     Time: 0927-0953 PT Time Calculation (min) (ACUTE ONLY): 26 min  Charges:  $Gait Training: 23-37 mins                     Sherie Don, [place], DPT  Acute Rehabilitation Services Pager [phone] Office [place] 07/09/2018, 1:03 PM

## 2018-07-09 NOTE — Discharge Summary (Signed)
Physician Discharge Summary  Patient ID: Jose [name]. MRN: [id number] DOB/AGE: Mar 26, 1936 82 y.o.  Admit date: 07/08/2018 Discharge date: 07/09/2018  Admission Diagnoses:  Primary localized osteoarthritis of left hip  Discharge Diagnoses:  Principal Problem:   Primary localized osteoarthritis of left hip Active Problems:   Primary localized osteoarthritis of hip   Past Medical History:  Diagnosis Date  . Arthritis   . CAD (coronary artery disease)    04/15/18 65% dLAD, 20% pLAD, 30% 2nd diag, Normal EF  . Dysrhythmia    afib  . Gallstones 07/1996  . Headache    couple times a year  . History of CT scan of head 1997   small vessel disease  . History of ETT 05/2002    no EKG changes  . History of MRI of cervical spine 07/15/2002  . Hypertension   . Lumbar spondylolysis 12/17/2016  . MGUS (monoclonal gammopathy of unknown significance) 04/11/2012  . Peripheral neuropathy   . Pneumonia 10/2014  . PONV (postoperative nausea and vomiting)    once many years ago  . Primary localized osteoarthritis of left hip 07/08/2018  . Primary localized osteoarthrosis of left shoulder 12/25/2016  . Primary localized osteoarthrosis of right shoulder 11/05/2017  . Rectal bleeding 12/2002   colonoscopy  . Seizures ([place])    last seizure 1976  . Skin cancer   . Sleep apnea    uses CPAP  . Spinal stenosis, lumbar region with neurogenic claudication   . Stroke [facility])    ?tia ?afib  . TIA (transient ischemic attack)   . 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 extremity 03/29/2015    Surgeries: Procedure(s): LEFT TOTAL HIP ARTHROPLASTY on 07/08/2018   Consultants (if any):   Discharged Condition: Improved  Hospital Course: Jose [name]. is an 82 y.o. male who was admitted 07/08/2018 with a diagnosis of Primary localized osteoarthritis of left hip and went to the operating room on 07/08/2018 and underwent the above named procedures.    He  was given perioperative antibiotics:  Anti-infectives (From admission, onward)   Start     Dose/Rate Route Frequency Ordered Stop   07/08/18 1400  ceFAZolin (ANCEF) IVPB 2g/100 mL premix     2 g 200 mL/hr over 30 Minutes Intravenous Every 6 hours 07/08/18 1259 07/09/18 0030   07/08/18 0600  ceFAZolin (ANCEF) IVPB 2g/100 mL premix     2 g 200 mL/hr over 30 Minutes Intravenous On call to O.R. 07/08/18 5852 07/08/18 0734    .  He was given sequential compression devices, early ambulation, and aspirin for DVT prophylaxis.  He benefited maximally from the hospital stay and there were no complications.    Recent vital signs:  Vitals:   07/09/18 0011 07/09/18 0217  BP: 122/63 124/76  Pulse: 78 79  Resp: 18 18  Temp: 98.6 F (37 C) 98 F (36.7 C)  SpO2: 96% 95%    Recent laboratory studies:  Lab Results  Component Value Date   HGB 11.2 (L) 07/09/2018   HGB 13.4 06/26/2018   HGB 12.8 (L) 04/16/2018   Lab Results  Component Value Date   WBC 11.7 (H) 07/09/2018   PLT 163 07/09/2018   Lab Results  Component Value Date   INR 1.22 07/08/2018   Lab Results  Component Value Date   NA 140 07/09/2018   K 3.7 07/09/2018   CL 108 07/09/2018   CO2 24 07/09/2018  BUN 18 07/09/2018   CREATININE 0.82 07/09/2018   GLUCOSE 141 (H) 07/09/2018    Discharge Medications:   Allergies as of 07/09/2018      Reactions   Maprotiline Other (See Comments)   TETRACYCLIC ANTIDEPRESSANTS UNSPECIFIED REACTION (patient is unaware of allergy) TETRACYCLIC ANTIDEPRESSANTS UNSPECIFIED REACTION (patient is unaware of allergy)   Tetracycline Hcl Itching, Rash   Rash on hands      Medication List    TAKE these medications   amLODipine 5 MG tablet Commonly known as:  NORVASC Take 5 mg by mouth daily.   aspirin 81 MG EC tablet Take 1 tablet (81 mg total) by mouth daily.   atorvastatin 40 MG tablet Commonly known as:  LIPITOR Take 1 tablet (40 mg total) by mouth daily.   baclofen 10 MG  tablet Commonly known as:  LIORESAL Take 1 tablet (10 mg total) by mouth 3 (three) times daily. As needed for muscle spasm   ELIQUIS 5 MG Tabs tablet Generic drug:  apixaban TAKE 1 TABLET TWICE A DAY What changed:  how much to take   HYDROcodone-acetaminophen 10-325 MG tablet Commonly known as:  NORCO Take 1 tablet by mouth every 6 (six) hours as needed.   isosorbide mononitrate 30 MG 24 hr tablet Commonly known as:  IMDUR Take 1 tablet (30 mg total) by mouth 2 (two) times daily.   labetalol 200 MG tablet Commonly known as:  NORMODYNE Take 100 mg by mouth 2 (two) times daily.   latanoprost 0.005 % ophthalmic solution Commonly known as:  XALATAN Place 1 drop into the right eye at bedtime.   losartan 50 MG tablet Commonly known as:  COZAAR Take 50 mg by mouth daily.   LUBRICANT EYE DROPS 0.4-0.3 % Soln Generic drug:  Polyethyl Glycol-Propyl Glycol Place 1 drop into the right eye at bedtime.   meclizine 25 MG tablet Commonly known as:  ANTIVERT Take 1 tablet (25 mg total) by mouth 3 (three) times daily as needed. What changed:  reasons to take this   nitroGLYCERIN 0.4 MG SL tablet Commonly known as:  NITROSTAT Place 1 tablet (0.4 mg total) under the tongue every 5 (five) minutes x 3 doses as needed for chest pain.   ondansetron 4 MG tablet Commonly known as:  ZOFRAN Take 1 tablet (4 mg total) by mouth every 8 (eight) hours as needed for nausea or vomiting.   PRESERVISION AREDS 2 Caps Take 1 capsule by mouth 2 (two) times daily.   sennosides-docusate sodium 8.6-50 MG tablet Commonly known as:  SENOKOT-S Take 2 tablets by mouth daily.   SYSTANE BALANCE 0.6 % Soln Generic drug:  Propylene Glycol Place 1 drop into both eyes 4 (four) times daily.       Diagnostic Studies: Dg Hip Port Unilat With Pelvis 1v Left  Result Date: 07/08/2018 CLINICAL DATA:  Left anterior hip discomfort.  Postoperative study EXAM: DG HIP (WITH OR WITHOUT PELVIS) 1V PORT LEFT COMPARISON:   None. FINDINGS: There is a prosthetic left hip joint in place. Radiographic positioning of the prosthetic components is good. The interface with the native bone appears normal. No acute native bone abnormality is observed. IMPRESSION: No postprocedure complication following left total hip joint prosthesis placement. Electronically Signed   By: David  Martinique M.D.   On: 07/08/2018 11:07    Disposition: Discharge disposition: 01-Home or Self Care         Follow-up Information    Marchia Bond, MD. Schedule an appointment as soon as possible  for a visit in 2 weeks.   Specialty:  Orthopedic Surgery Contact information: [address] [place] [address] [phone]        [facility] Follow up.   Specialty:  Lena Why:  Home Health Physical Therapy- will call to arrange initial visit Contact information: [address] [place] [address] [phone]            Signed: [place] Bridge 07/09/2018, 3:18 PM

## 2018-07-21 ENCOUNTER — Encounter: Payer: Self-pay | Admitting: Physical Therapy

## 2018-07-21 ENCOUNTER — Ambulatory Visit: Payer: Medicare Other | Attending: Orthopedic Surgery | Admitting: Physical Therapy

## 2018-07-21 ENCOUNTER — Other Ambulatory Visit: Payer: Self-pay

## 2018-07-21 DIAGNOSIS — M6281 Muscle weakness (generalized): Secondary | ICD-10-CM

## 2018-07-21 DIAGNOSIS — R262 Difficulty in walking, not elsewhere classified: Secondary | ICD-10-CM | POA: Diagnosis present

## 2018-07-21 DIAGNOSIS — M25552 Pain in left hip: Secondary | ICD-10-CM

## 2018-07-21 MED FILL — SULFAMETHOXAZOLE-TMP DS TAB: 800-160 | 7 days supply | Qty: 14 | Fill #0

## 2018-07-21 NOTE — Therapy (Signed)
[facility] [address]  [place], [place], [address] Phone: [phone]   Fax:  [phone]  Physical Therapy Evaluation  Patient Details  Name: Jose [name]. MRN: [id number] Date of Birth: [birth year] Referring Provider: Marchia Bond, MD   Encounter Date: 07/21/2018  PT End of Session - 07/21/18 1647    Visit Number  1    Number of Visits  13    Date for PT Re-Evaluation  09/01/18    Authorization Type  Medicare & Tricare    PT Start Time  1447    PT Stop Time  1528    PT Time Calculation (min)  41 min    Activity Tolerance  Patient tolerated treatment well    Behavior During Therapy  [facility] for tasks assessed/performed       Past Medical History:  Diagnosis Date  . Arthritis   . CAD (coronary artery disease)    04/15/18 80% dLAD, 20% pLAD, 30% 2nd diag, Normal EF  . Dysrhythmia    afib  . Gallstones 07/1996  . Headache    couple times a year  . History of CT scan of head 1997   small vessel disease  . History of ETT 05/2002    no EKG changes  . History of MRI of cervical spine 07/15/2002  . Hypertension   . Lumbar spondylolysis 12/17/2016  . MGUS (monoclonal gammopathy of unknown significance) 04/11/2012  . Peripheral neuropathy   . Pneumonia 10/2014  . PONV (postoperative nausea and vomiting)    once many years ago  . Primary localized osteoarthritis of left hip 07/08/2018  . Primary localized osteoarthrosis of left shoulder 12/25/2016  . Primary localized osteoarthrosis of right shoulder 11/05/2017  . Rectal bleeding 12/2002   colonoscopy  . Seizures ([place])    last seizure 1976  . Skin cancer   . Sleep apnea    uses CPAP  . Spinal stenosis, lumbar region with neurogenic claudication   . Stroke [facility])    ?tia ?afib  . TIA (transient ischemic attack)   . 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 extremity 03/29/2015    Past Surgical History:   Procedure Laterality Date  . ANTERIOR CERVICAL DECOMP/DISCECTOMY FUSION N/A 02/04/2015   Procedure: ANTERIOR CERVICAL DECOMPRESSION/DISCECTOMY FUSION CERVICAL FIVE-SIX,CERVICAL SIX-SEVEN;  Surgeon: Karie Chimera, MD;  Location: [facility];  Service: Neurosurgery;  Laterality: N/A;  . BACK SURGERY    . CATARACT EXTRACTION Bilateral   . COLONOSCOPY W/ POLYPECTOMY  04/19/2003   tubular adenoma  . EYE SURGERY     Bilateral Cataracts  . GREAT TOE ARTHRODESIS, INTERPHALANGEAL JOINT Left 1978  . JOINT REPLACEMENT    . joint replacement on right thumb Right   . LEFT HEART CATH AND CORONARY ANGIOGRAPHY N/A 04/15/2018   Procedure: LEFT HEART CATH AND CORONARY ANGIOGRAPHY;  Surgeon: Troy Sine, MD;  Location: [facility];  Service: Cardiovascular;  Laterality: N/A;  . LUMBAR LAMINECTOMY/DECOMPRESSION MICRODISCECTOMY N/A 07/19/2017   Procedure: LAMINECTOMY AND FORAMINOTOMY  LUMBAR TWO- LUMBAR THREE, LUMBAR THREE- LUMBAR FOUR;  Surgeon: Ashok Pall, MD;  Location: [facility];  Service: Neurosurgery;  Laterality: N/A;  . nerve transfer surgery to lwft hand Left    Nov. 2017  . SHOULDER ARTHROSCOPY W/ ROTATOR CUFF REPAIR Left 13   rotator cuff   . SHOULDER INJECTION  June 2012   left, [place]  . SKIN CANCER  EXCISION  07/1993   left lateral arm  . SUPERFICIAL KERATECTOMY Right 05/1998   wrist  . thumb injection  June 2012   left, [place]  . TONSILLECTOMY AND ADENOIDECTOMY    . TOTAL HIP ARTHROPLASTY Left 07/08/2018  . TOTAL HIP ARTHROPLASTY Left 07/08/2018   Procedure: LEFT TOTAL HIP ARTHROPLASTY;  Surgeon: Marchia Bond, MD;  Location: [facility];  Service: Orthopedics;  Laterality: Left;  . TOTAL SHOULDER ARTHROPLASTY Left 12/25/2016   Procedure: TOTAL SHOULDER ARTHROPLASTY;  Surgeon: Marchia Bond, MD;  Location: [facility];  Service: Orthopedics;  Laterality: Left;  . TOTAL SHOULDER ARTHROPLASTY Right 11/05/2017  . TOTAL SHOULDER ARTHROPLASTY Right 11/05/2017   Procedure: TOTAL SHOULDER ARTHROPLASTY;   Surgeon: Marchia Bond, MD;  Location: [facility];  Service: Orthopedics;  Laterality: Right;  . ulnar nerve transfer  12/1999   left  . ULNAR NERVE TRANSPOSITION Left     There were no vitals filed for this visit.   Subjective Assessment - 07/21/18 1452    Subjective  Patient reports he underwent L THA posterior approach on 07/08/18. D/C'd from hospital the next day. Had [facility] for 3 visits- they worked on exercises and took BP. Reports he has been compliant with HEP given by [facility] PT. Per patient- precautions include no sitting in low chairs. Has been using commode chair and reacher in order to remain compliant with precautions. Has been walking outside quite a bit with rolling walker. Wearing ted hose on B LEs- MD told him to discontinue these today. Patient was not using AD at [facility]. MD found red spot near incision and will be rechecking it for infection next week.    Pertinent History  L ulnar neuropathy with transposition, TIA, stroke, lumbar spinal stenosis, skin CA, seizures, peripheral neuropathy, HTN, gallstones, dysrhythmia, CAD, B TSA, lumbar laminectomy/decompression, L heart carth & angio, L great toe arthrodesis. anterior cervical decomp/disectomy     Limitations  Sitting;Lifting;Standing;Walking;House hold activities    Diagnostic tests  L hip xray 07/08/18: No postprocedure complication following left total hip joint    Patient Stated Goals  not sure, just improve healing    Currently in Pain?  Yes    Pain Score  4     Pain Location  Hip    Pain Orientation  Left   L posterolateral hip   Pain Descriptors / Indicators  Aching;Burning    Pain Type  Acute pain;Surgical pain    Aggravating Factors   prolonged unsupported sitting    Pain Relieving Factors  meds         [facility] PT Assessment - 07/21/18 1508      Assessment   Medical Diagnosis  S/P L THA    Referring Provider  Marchia Bond, MD    Onset Date/Surgical Date  07/08/18    Next MD Visit  --   next week   Prior Therapy   Yes- HH PT      Precautions   Precautions  Posterior Hip   B TSA   Precaution Comments  no flex >90, adduction, IR past neutral      Restrictions   Weight Bearing Restrictions  No      Balance Screen   Has the patient fallen in the past 6 months  No    Has the patient had a decrease in activity level because of a fear of falling?   No    Is the patient reluctant to leave their home because of a fear of falling?  No      Home Environment   Living Environment  Private residence    Living Arrangements  Spouse/significant other    Available Help at Discharge  Family    Type of [place] Access  Level entry   1 step    [place]  One level    [place] - 2 wheels;Bedside commode      Prior Function   Level of Independence  Independent    Vocation  Retired      Charity fundraiser Status  Within Functional Limits for tasks assessed      Observation/Other Assessments   Focus on Therapeutic Outcomes (FOTO)   Hip: 48 (52% limited, 39% predicted)      Observation/Other Assessments-Edema    Edema  --   L LE diffusely edematous     Sensation   Light Touch  Appears Intact   decreased sensation on L hand     Coordination   Gross Motor Movements are Fluid and Coordinated  Yes      Posture/Postural Control   Posture/Postural Control  Postural limitations    Postural Limitations  Weight shift right;Rounded Shoulders;Forward head      ROM / Strength   AROM / PROM / Strength  AROM;Strength      Strength   Strength Assessment Site  Hip;Knee;Ankle    Right/Left Hip  Right;Left    Right Hip Flexion  4/5    Right Hip ABduction  4+/5    Right Hip ADduction  4+/5    Left Hip Flexion  4-/5    Left Hip ABduction  4-/5    Left Hip ADduction  4/5    Right/Left Knee  Right;Left    Right Knee Flexion  4+/5    Right Knee Extension  4+/5    Left Knee Flexion  4+/5    Left Knee Extension  4+/5    Right/Left Ankle  Right;Left    Right Ankle  Dorsiflexion  4/5    Right Ankle Plantar Flexion  4+/5    Left Ankle Dorsiflexion  4/5    Left Ankle Plantar Flexion  4+/5      Palpation   Palpation comment  L posterolateral hip edematous and TTP      Ambulation/Gait   Assistive device  Rolling walker    Gait Pattern  Step-through pattern;Decreased weight shift to left;Decreased stance time - left;Decreased step length - right;Decreased hip/knee flexion - left    Ambulation Surface  Level;Indoor    Gait velocity  decreased      Standardized Balance Assessment   Standardized Balance Assessment  Timed Up and Go Test      Timed Up and Go Test   Normal TUG (seconds)  18   with rolling walker               Objective measurements completed on examination: See above findings.              PT Education - 07/21/18 1647    Education Details  prognosis, POC, HEP    Person(s) Educated  Patient    Methods  Explanation;Demonstration;Tactile cues;Verbal cues;Handout    Comprehension  Verbalized understanding;Returned demonstration       PT Short Term Goals - 07/21/18 1655      PT SHORT TERM GOAL #1   Title  Patient to be independent with initial HEP.    Time  3  Period  Weeks    Status  New    Target Date  08/11/18        PT Long Term Goals - 07/21/18 1656      PT LONG TERM GOAL #1   Title  Patient to be independent with advanced HEP.    Time  6    Period  Weeks    Status  New    Target Date  09/01/18      PT LONG TERM GOAL #2   Title  Patient to demonstrate B LE strength >=4+/5.    Time  6    Period  Weeks    Status  New    Target Date  09/01/18      PT LONG TERM GOAL #3   Title  Patient to score <14 sec on TUG with LRAD.    Time  6    Period  Weeks    Status  New    Target Date  09/01/18      PT LONG TERM GOAL #4   Title  Patient to demonstrate symmetrical step length, stance time, weight shift during ambulation with LRAD.    Time  6    Period  Weeks    Status  New    Target Date   09/01/18             Plan - 07/21/18 1648    Clinical Impression Statement  Patient is an 82y/o M presenting to OPPT after L posterior approach THA on 07/08/18. Patient able to recall all surgical precautions after questioning. Patient ambulating with rolling walker at this time and with mild-moderate pain levels at incision site and into groin. Reports that MD concerned for infection. Patient today with diffusely edematous L LE, tenderness at incision, weakness in L hip, decreased gait speed and gait deviations. Patient educated on and received gentle ROM and strengthening HEP. Advised patient to maintain surgical precautions. Patient reported understanding. Would benefit from skilled PT services 2x/week for 6 weeks to address aforementioned impairments.     Clinical Presentation  Stable    Clinical Decision Making  Low    Rehab Potential  Good    PT Frequency  2x / week    PT Duration  6 weeks    PT Treatment/Interventions  ADLs/Self Care Home Management;Cryotherapy;Electrical Stimulation;Moist Heat;Ultrasound;DME Instruction;Gait training;Stair training;Functional mobility training;Therapeutic activities;Therapeutic exercise;Manual techniques;Patient/family education;Neuromuscular re-education;Balance training;Scar mobilization;Passive range of motion;Dry needling;Energy conservation;Splinting;Taping;Vasopneumatic Device    PT Next Visit Plan  reassess HEP    Consulted and Agree with Plan of Care  Patient       Patient will benefit from skilled therapeutic intervention in order to improve the following deficits and impairments:  Decreased scar mobility, Increased edema, Pain, Decreased strength, Decreased activity tolerance, Decreased balance, Difficulty walking, Improper body mechanics, Postural dysfunction  Visit Diagnosis: Pain in left hip  Muscle weakness (generalized)  Difficulty in walking, not elsewhere classified     Problem List Patient Active Problem List   Diagnosis  Date Noted  . Primary localized osteoarthritis of left hip 07/08/2018  . Primary localized osteoarthritis of hip 07/08/2018  . CAD (coronary artery disease) 04/16/2018  . Chest pain   . Abnormal cardiovascular stress test 04/14/2018  . Primary localized osteoarthrosis of right shoulder 11/05/2017  . Primary localized osteoarthrosis of shoulder 11/05/2017  . Lumbar stenosis with neurogenic claudication 07/19/2017  . Primary localized osteoarthrosis of left shoulder 12/25/2016  . S/P shoulder replacement 12/25/2016  . Lumbar spondylolysis 12/17/2016  .  Paresthesia 01/24/2016  . 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 extremity 03/29/2015  . Cervical spinal stenosis 02/04/2015  . Bronchiectasis without acute exacerbation ([place]) 01/11/2015  . Ulnar neuropathy at elbow 12/24/2014  . TIA (transient ischemic attack) 12/15/2014  . Hyperlipidemia 11/24/2014  . Hypoxia   . New onset a-fib ([place])   . MGUS (monoclonal gammopathy of unknown significance) 04/11/2012  . SEBORRHEIC KERATOSIS 11/09/2010  . CPK, ABNORMAL 09/03/2010  . DERMATOPHYTOSIS OF FOOT 08/03/2010  . MYALGIA 12/27/2009  . GOUT, UNSPECIFIED 10/04/2009  . HYPERTROPHY PROSTATE W/O UR OBST & OTH LUTS 12/14/2008  . ALLERGIC CONJUNCTIVITIS 12/03/2007  . TESTOSTERONE DEFICIENCY 11/14/2007  . Backache 04/22/2007  . HYPERLIPIDEMIA 12/26/2006  . OBESITY, NOS 12/26/2006  . 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 Hereditary and idiopathic peripheral neuropathy 12/26/2006  . HEARING LOSS NOS OR DEAFNESS 12/26/2006  . HYPERTENSION, BENIGN SYSTEMIC 12/26/2006  . OSTEOARTHRITIS, MULTI SITES 12/26/2006  . Musculoskeletal disorder and symptoms referable to neck 12/26/2006     Janene Harvey, PT, DPT 07/21/18 5:00 PM   [facility] [address]  [place], [place], [address] Phone: [phone]   Fax:  [phone]  Name: Tou Hayner. MRN: [id number] Date of Birth:  12-20-1935

## 2018-07-24 ENCOUNTER — Ambulatory Visit (HOSPITAL_COMMUNITY): Admit: 2018-07-24 | Payer: Medicare Other | Admitting: Orthopedic Surgery

## 2018-07-24 ENCOUNTER — Encounter (HOSPITAL_COMMUNITY): Payer: Self-pay

## 2018-07-24 SURGERY — IRRIGATION AND DEBRIDEMENT HIP
Anesthesia: Choice | Laterality: Left

## 2018-07-29 ENCOUNTER — Encounter: Payer: Self-pay | Admitting: Physical Therapy

## 2018-07-29 ENCOUNTER — Ambulatory Visit: Payer: Medicare Other | Attending: Orthopedic Surgery | Admitting: Physical Therapy

## 2018-07-29 DIAGNOSIS — M25552 Pain in left hip: Secondary | ICD-10-CM

## 2018-07-29 DIAGNOSIS — M6281 Muscle weakness (generalized): Secondary | ICD-10-CM

## 2018-07-29 DIAGNOSIS — R262 Difficulty in walking, not elsewhere classified: Secondary | ICD-10-CM

## 2018-07-29 NOTE — Therapy (Signed)
[facility] [address]  [place], [place], [address] Phone: [phone]   Fax:  [phone]  Physical Therapy Treatment  Patient Details  Name: Jose [name]. MRN: [id number] Date of Birth: [birth year] Referring Provider (PT): Marchia Bond, MD   Encounter Date: 07/29/2018  PT End of Session - 07/29/18 1616    Visit Number  2    Number of Visits  13    Date for PT Re-Evaluation  09/01/18    Authorization Type  Medicare & Tricare    PT Start Time  1529    PT Stop Time  9924    PT Time Calculation (min)  45 min    Equipment Utilized During Treatment  Gait belt    Activity Tolerance  Patient tolerated treatment well    Behavior During Therapy  WFL for tasks assessed/performed       Past Medical History:  Diagnosis Date  . Arthritis   . CAD (coronary artery disease)    04/15/18 65% dLAD, 20% pLAD, 30% 2nd diag, Normal EF  . Dysrhythmia    afib  . Gallstones 07/1996  . Headache    couple times a year  . History of CT scan of head 1997   small vessel disease  . History of ETT 05/2002    no EKG changes  . History of MRI of cervical spine 07/15/2002  . Hypertension   . Lumbar spondylolysis 12/17/2016  . MGUS (monoclonal gammopathy of unknown significance) 04/11/2012  . Peripheral neuropathy   . Pneumonia 10/2014  . PONV (postoperative nausea and vomiting)    once many years ago  . Primary localized osteoarthritis of left hip 07/08/2018  . Primary localized osteoarthrosis of left shoulder 12/25/2016  . Primary localized osteoarthrosis of right shoulder 11/05/2017  . Rectal bleeding 12/2002   colonoscopy  . Seizures ([place])    last seizure 1976  . Skin cancer   . Sleep apnea    uses CPAP  . Spinal stenosis, lumbar region with neurogenic claudication   . Stroke [facility])    ?tia ?afib  . TIA (transient ischemic attack)   . 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  extremity 03/29/2015    Past Surgical History:  Procedure Laterality Date  . ANTERIOR CERVICAL DECOMP/DISCECTOMY FUSION N/A 02/04/2015   Procedure: ANTERIOR CERVICAL DECOMPRESSION/DISCECTOMY FUSION CERVICAL FIVE-SIX,CERVICAL SIX-SEVEN;  Surgeon: Karie Chimera, MD;  Location: [place] [facility];  Service: Neurosurgery;  Laterality: N/A;  . BACK SURGERY    . CATARACT EXTRACTION Bilateral   . COLONOSCOPY W/ POLYPECTOMY  04/19/2003   tubular adenoma  . EYE SURGERY     Bilateral Cataracts  . GREAT TOE ARTHRODESIS, INTERPHALANGEAL JOINT Left 1978  . JOINT REPLACEMENT    . joint replacement on right thumb Right   . LEFT HEART CATH AND CORONARY ANGIOGRAPHY N/A 04/15/2018   Procedure: LEFT HEART CATH AND CORONARY ANGIOGRAPHY;  Surgeon: Troy Sine, MD;  Location: [place] CV LAB;  Service: Cardiovascular;  Laterality: N/A;  . LUMBAR LAMINECTOMY/DECOMPRESSION MICRODISCECTOMY N/A 07/19/2017   Procedure: LAMINECTOMY AND FORAMINOTOMY  LUMBAR TWO- LUMBAR THREE, LUMBAR THREE- LUMBAR FOUR;  Surgeon: Ashok Pall, MD;  Location: [facility];  Service: Neurosurgery;  Laterality: N/A;  . nerve transfer surgery to lwft hand Left    Nov. 2017  . SHOULDER ARTHROSCOPY W/ ROTATOR CUFF REPAIR Left 13   rotator cuff   . SHOULDER INJECTION  June 2012   left, Noemi Chapel  . SKIN CANCER EXCISION  07/1993   left lateral arm  . SUPERFICIAL KERATECTOMY Right 05/1998   wrist  . thumb injection  June 2012   left, [place]  . TONSILLECTOMY AND ADENOIDECTOMY    . TOTAL HIP ARTHROPLASTY Left 07/08/2018  . TOTAL HIP ARTHROPLASTY Left 07/08/2018   Procedure: LEFT TOTAL HIP ARTHROPLASTY;  Surgeon: Marchia Bond, MD;  Location: [facility];  Service: Orthopedics;  Laterality: Left;  . TOTAL SHOULDER ARTHROPLASTY Left 12/25/2016   Procedure: TOTAL SHOULDER ARTHROPLASTY;  Surgeon: Marchia Bond, MD;  Location: [facility];  Service: Orthopedics;  Laterality: Left;  . TOTAL SHOULDER ARTHROPLASTY Right 11/05/2017  . TOTAL SHOULDER ARTHROPLASTY Right  11/05/2017   Procedure: TOTAL SHOULDER ARTHROPLASTY;  Surgeon: Marchia Bond, MD;  Location: [facility];  Service: Orthopedics;  Laterality: Right;  . ulnar nerve transfer  12/1999   left  . ULNAR NERVE TRANSPOSITION Left     There were no vitals filed for this visit.  Subjective Assessment - 07/29/18 1533    Subjective  Patient reports he had a rough week last week- MD was concerned about infection. Scheduled for debridement and irrigation procedure but MD decided against it. Reports he has started to be able to walk a little better.     Pertinent History  L ulnar neuropathy with transposition, TIA, stroke, lumbar spinal stenosis, skin CA, seizures, peripheral neuropathy, HTN, gallstones, dysrhythmia, CAD, B TSA, lumbar laminectomy/decompression, L heart carth & angio, L great toe arthrodesis. anterior cervical decomp/disectomy     Diagnostic tests  L hip xray 07/08/18: No postprocedure complication following left total hip joint    Patient Stated Goals  not sure, just improve healing    Currently in Pain?  Yes    Pain Score  3     Pain Location  Hip    Pain Orientation  Left    Pain Descriptors / Indicators  Aching;Burning    Pain Type  Acute pain;Surgical pain                       [facility] Adult PT Treatment/Exercise - 07/29/18 0001      Ambulation/Gait   Ambulation/Gait  Yes    Ambulation Distance (Feet)  180 Feet    Assistive device  Straight cane    Gait Pattern  Step-through pattern;Decreased weight shift to left;Decreased stance time - left;Decreased step length - right;Decreased hip/knee flexion - left;Poor foot clearance - right;Poor foot clearance - left    Ambulation Surface  Level;Indoor    Gait velocity  decreased    Gait Comments  gait training with SPC and CGA + through obstacle course stepping around cones, over beanbags, and turning   cues to keep toes up; CGA     Exercises   Exercises  Knee/Hip      Knee/Hip Exercises: Aerobic   Nustep  L1 x 43min UE/LE  avoiding past 90 degrees hip flexion      Knee/Hip Exercises: Standing   Hip Abduction  Stengthening;AROM;Right;Both;1 set;10 reps;Knee straight;Limitations    Abduction Limitations  cues for upright trunk and knee straight      Knee/Hip Exercises: Seated   Long Arc Quad  Strengthening;Left;1 set;10 reps;Weights;Limitations;Right    Long Arc Quad Weight  2 lbs.    Long [employer] Limitations  cues to decrease speed    Sit to [employer]  1 set;10 reps;without UE support   cues to bring feet underneath body and avoid  knees snapping      Knee/Hip Exercises: Supine   Bridges  Strengthening;Both;2 sets;10 reps;Limitations    Bridges Limitations  2x10; cues to decreased speed    Other Supine Knee/Hip Exercises  TrA contraction and palpation 10x10"   VCs for palpation and rhythmic breathing   Other Supine Knee/Hip Exercises  TrA + bent knee fallout with red TB around knees x10 each LE             PT Education - 07/29/18 1615    Education Details  advised patient to transition to ambulating with SPC at home as long as tripping hazards are taken care of    Person(s) Educated  Patient    Methods  Explanation;Demonstration;Tactile cues;Verbal cues    Comprehension  Verbalized understanding       PT Short Term Goals - 07/29/18 1620      PT SHORT TERM GOAL #1   Title  Patient to be independent with initial HEP.    Time  3    Period  Weeks    Status  On-going        PT Long Term Goals - 07/29/18 1621      PT LONG TERM GOAL #1   Title  Patient to be independent with advanced HEP.    Time  6    Period  Weeks    Status  On-going      PT LONG TERM GOAL #2   Title  Patient to demonstrate B LE strength >=4+/5.    Time  6    Period  Weeks    Status  On-going      PT LONG TERM GOAL #3   Title  Patient to score <14 sec on TUG with LRAD.    Time  6    Period  Weeks    Status  On-going      PT LONG TERM GOAL #4   Title  Patient to demonstrate symmetrical step length, stance time,  weight shift during ambulation with LRAD.    Time  6    Period  Weeks    Status  On-going            Plan - 07/29/18 1616    Clinical Impression Statement  Patient arrived to session with report that he was scheduled for irrigation and debridement of L hip incision site, however this was cancelled d/t MD not being concerned of infection any longer. Patient also with significantly decreased swelling in L LE since last session. Patient tolerated supine mat ther-ex for core activation and LE strengthening. Reporting dizziness from sitting up from supine after ther-ex which quickly dissipated. Patient reports he is ready to transition from walker to [facility]- worked on Personnel officer with [facility] and through obstacle course with CGA. Patient showing goo stability throughout and safe when maneuvering around/over obstacles. Intermittent cues require to promote heel strike rather than foot flat and spoke to patient about potential tripping hazards in the home. Advised patient to transition to [facility] inside the home at this time. Patient reported understanding. Ended session without c/o increased pain.     PT Treatment/Interventions  ADLs/Self Care Home Management;Cryotherapy;Electrical Stimulation;Moist Heat;Ultrasound;DME Instruction;Gait training;Stair training;Functional mobility training;Therapeutic activities;Therapeutic exercise;Manual techniques;Patient/family education;Neuromuscular re-education;Balance training;Scar mobilization;Passive range of motion;Dry needling;Energy conservation;Splinting;Taping;Vasopneumatic Device    Consulted and Agree with Plan of Care  Patient       Patient will benefit from skilled therapeutic intervention in order to improve the following deficits and impairments:  Decreased scar mobility,  Increased edema, Pain, Decreased strength, Decreased activity tolerance, Decreased balance, Difficulty walking, Improper body mechanics, Postural dysfunction  Visit Diagnosis: Pain in left  hip  Muscle weakness (generalized)  Difficulty in walking, not elsewhere classified     Problem List Patient Active Problem List   Diagnosis Date Noted  . Primary localized osteoarthritis of left hip 07/08/2018  . Primary localized osteoarthritis of hip 07/08/2018  . CAD (coronary artery disease) 04/16/2018  . Chest pain   . Abnormal cardiovascular stress test 04/14/2018  . Primary localized osteoarthrosis of right shoulder 11/05/2017  . Primary localized osteoarthrosis of shoulder 11/05/2017  . Lumbar stenosis with neurogenic claudication 07/19/2017  . Primary localized osteoarthrosis of left shoulder 12/25/2016  . S/P shoulder replacement 12/25/2016  . Lumbar spondylolysis 12/17/2016  . Paresthesia 01/24/2016  . 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 extremity 03/29/2015  . Cervical spinal stenosis 02/04/2015  . Bronchiectasis without acute exacerbation ([place]) 01/11/2015  . Ulnar neuropathy at elbow 12/24/2014  . TIA (transient ischemic attack) 12/15/2014  . Hyperlipidemia 11/24/2014  . Hypoxia   . New onset a-fib ([place])   . MGUS (monoclonal gammopathy of unknown significance) 04/11/2012  . SEBORRHEIC KERATOSIS 11/09/2010  . CPK, ABNORMAL 09/03/2010  . DERMATOPHYTOSIS OF FOOT 08/03/2010  . MYALGIA 12/27/2009  . GOUT, UNSPECIFIED 10/04/2009  . HYPERTROPHY PROSTATE W/O UR OBST & OTH LUTS 12/14/2008  . ALLERGIC CONJUNCTIVITIS 12/03/2007  . TESTOSTERONE DEFICIENCY 11/14/2007  . Backache 04/22/2007  . HYPERLIPIDEMIA 12/26/2006  . OBESITY, NOS 12/26/2006  . 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 Hereditary and idiopathic peripheral neuropathy 12/26/2006  . HEARING LOSS NOS OR DEAFNESS 12/26/2006  . HYPERTENSION, BENIGN SYSTEMIC 12/26/2006  . OSTEOARTHRITIS, MULTI SITES 12/26/2006  . Musculoskeletal disorder and symptoms referable to neck 12/26/2006     Janene Harvey, PT, DPT 07/29/18 4:24 PM   [facility] [address]  [place], [place], [address] Phone: [phone]   Fax:  [phone]  Name: Jose [name]. MRN: [id number] Date of Birth: 1936-05-11

## 2018-07-31 ENCOUNTER — Ambulatory Visit: Payer: Medicare Other | Admitting: Physical Therapy

## 2018-07-31 ENCOUNTER — Encounter: Payer: Self-pay | Admitting: Physical Therapy

## 2018-07-31 DIAGNOSIS — M6281 Muscle weakness (generalized): Secondary | ICD-10-CM

## 2018-07-31 DIAGNOSIS — M25552 Pain in left hip: Secondary | ICD-10-CM

## 2018-07-31 DIAGNOSIS — R262 Difficulty in walking, not elsewhere classified: Secondary | ICD-10-CM

## 2018-07-31 NOTE — Therapy (Signed)
[facility] [address]  [place], [place], [address] Phone: [phone]   Fax:  [phone]  Physical Therapy Treatment  Patient Details  Name: Jose [name]. MRN: [id number] Date of Birth: 1936/10/25 Referring Provider (PT): Marchia Bond, MD   Encounter Date: 07/31/2018  PT End of Session - 07/31/18 1015    Visit Number  3    Number of Visits  13    Date for PT Re-Evaluation  09/01/18    Authorization Type  Medicare & Tricare    PT Start Time  8502    PT Stop Time  1112    PT Time Calculation (min)  57 min    Equipment Utilized During Treatment  Gait belt    Activity Tolerance  Patient tolerated treatment well    Behavior During Therapy  WFL for tasks assessed/performed       Past Medical History:  Diagnosis Date  . Arthritis   . CAD (coronary artery disease)    04/15/18 65% dLAD, 20% pLAD, 30% 2nd diag, Normal EF  . Dysrhythmia    afib  . Gallstones 07/1996  . Headache    couple times a year  . History of CT scan of head 1997   small vessel disease  . History of ETT 05/2002    no EKG changes  . History of MRI of cervical spine 07/15/2002  . Hypertension   . Lumbar spondylolysis 12/17/2016  . MGUS (monoclonal gammopathy of unknown significance) 04/11/2012  . Peripheral neuropathy   . Pneumonia 10/2014  . PONV (postoperative nausea and vomiting)    once many years ago  . Primary localized osteoarthritis of left hip 07/08/2018  . Primary localized osteoarthrosis of left shoulder 12/25/2016  . Primary localized osteoarthrosis of right shoulder 11/05/2017  . Rectal bleeding 12/2002   colonoscopy  . Seizures ([place])    last seizure 1976  . Skin cancer   . Sleep apnea    uses CPAP  . Spinal stenosis, lumbar region with neurogenic claudication   . Stroke [facility])    ?tia ?afib  . TIA (transient ischemic attack)   . 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  extremity 03/29/2015    Past Surgical History:  Procedure Laterality Date  . ANTERIOR CERVICAL DECOMP/DISCECTOMY FUSION N/A 02/04/2015   Procedure: ANTERIOR CERVICAL DECOMPRESSION/DISCECTOMY FUSION CERVICAL FIVE-SIX,CERVICAL SIX-SEVEN;  Surgeon: Karie Chimera, MD;  Location: [place] [facility];  Service: Neurosurgery;  Laterality: N/A;  . BACK SURGERY    . CATARACT EXTRACTION Bilateral   . COLONOSCOPY W/ POLYPECTOMY  04/19/2003   tubular adenoma  . EYE SURGERY     Bilateral Cataracts  . GREAT TOE ARTHRODESIS, INTERPHALANGEAL JOINT Left 1978  . JOINT REPLACEMENT    . joint replacement on right thumb Right   . LEFT HEART CATH AND CORONARY ANGIOGRAPHY N/A 04/15/2018   Procedure: LEFT HEART CATH AND CORONARY ANGIOGRAPHY;  Surgeon: Troy Sine, MD;  Location: [place] [facility];  Service: Cardiovascular;  Laterality: N/A;  . LUMBAR LAMINECTOMY/DECOMPRESSION MICRODISCECTOMY N/A 07/19/2017   Procedure: LAMINECTOMY AND FORAMINOTOMY  LUMBAR TWO- LUMBAR THREE, LUMBAR THREE- LUMBAR FOUR;  Surgeon: Ashok Pall, MD;  Location: [place];  Service: Neurosurgery;  Laterality: N/A;  . nerve transfer surgery to lwft hand Left    Nov. 2017  . SHOULDER ARTHROSCOPY W/ ROTATOR CUFF REPAIR Left 13   rotator cuff   . SHOULDER INJECTION  June 2012   left, Noemi Chapel  . SKIN CANCER EXCISION  07/1993   left lateral arm  . SUPERFICIAL KERATECTOMY Right 05/1998   wrist  . thumb injection  June 2012   left, [place]  . TONSILLECTOMY AND ADENOIDECTOMY    . TOTAL HIP ARTHROPLASTY Left 07/08/2018  . TOTAL HIP ARTHROPLASTY Left 07/08/2018   Procedure: LEFT TOTAL HIP ARTHROPLASTY;  Surgeon: Marchia Bond, MD;  Location: [facility];  Service: Orthopedics;  Laterality: Left;  . TOTAL SHOULDER ARTHROPLASTY Left 12/25/2016   Procedure: TOTAL SHOULDER ARTHROPLASTY;  Surgeon: Marchia Bond, MD;  Location: [facility];  Service: Orthopedics;  Laterality: Left;  . TOTAL SHOULDER ARTHROPLASTY Right 11/05/2017  . TOTAL SHOULDER ARTHROPLASTY Right  11/05/2017   Procedure: TOTAL SHOULDER ARTHROPLASTY;  Surgeon: Marchia Bond, MD;  Location: [facility];  Service: Orthopedics;  Laterality: Right;  . ulnar nerve transfer  12/1999   left  . ULNAR NERVE TRANSPOSITION Left     There were no vitals filed for this visit.  Subjective Assessment - 07/31/18 1018    Subjective  Pt with questions regarding use of cane vs RW for community ambulation.    Pertinent History  L ulnar neuropathy with transposition, TIA, stroke, lumbar spinal stenosis, skin CA, seizures, peripheral neuropathy, HTN, gallstones, dysrhythmia, CAD, B TSA, lumbar laminectomy/decompression, L heart carth & angio, L great toe arthrodesis. anterior cervical decomp/disectomy     Diagnostic tests  L hip xray 07/08/18: No postprocedure complication following left total hip joint    Patient Stated Goals  not sure, just improve healing    Currently in Pain?  Yes    Pain Score  --   2-3/10   Pain Location  Hip    Pain Orientation  Left    Pain Type  Acute pain;Surgical pain                       [facility] Adult PT Treatment/Exercise - 07/31/18 1015      Ambulation/Gait   Ambulation Distance (Feet)  90 Feet    Assistive device  Straight cane    Gait Pattern  Step-through pattern;Decreased weight shift to left    Ambulation Surface  Level;Indoor    Gait Comments  Improved hip and knee flexion with improved foot clearance but still slight decreased wt shift to L.      Exercises   Exercises  Knee/Hip      Knee/Hip Exercises: Aerobic   Nustep  L2 x 6 min - LE only, avoiding past 90 degrees hip flexion      Knee/Hip Exercises: Standing   Heel Raises  Both;10 reps;3 seconds    Heel Raises Limitations  cues for quad & glute set; UE support on counter for balance    Hip Abduction  Stengthening;AROM;Right;Both;10 reps;Knee straight;1 set    Abduction Limitations  cues for upright trunk and knee straight    Hip Extension  Stengthening;AROM;Both;15 reps;Knee straight     Extension Limitations  cues for upright trunk and knee straight    Lateral Step Up  Left;10 reps;Step Height: 4";Hand Hold: 2    Forward Step Up  Left;10 reps;Step Height: 4";Hand Hold: 1    Forward Step Up Limitations  UE support on counter as needed for balance      Knee/Hip Exercises: Seated   Hamstring Curl  Strengthening;Left;15 reps    Hamstring Limitations  red TB      Knee/Hip Exercises: Supine   Bridges  Strengthening;Both;15 reps  Bridges Limitations  + hip ABD isometric with red TB; cues for 3 sec hold    Straight Leg Raises  Strengthening;Left;10 reps    Straight Leg Raises Limitations  cues for quad set prior to initation of lift    Other Supine Knee/Hip Exercises  TrA + bent knee fallout with red TB around knees x15 each LE      Modalities   Modalities  Cryotherapy      Cryotherapy   Number Minutes Cryotherapy  10 Minutes    Cryotherapy Location  Hip   L anterior hip   Type of Cryotherapy  Ice pack               PT Short Term Goals - 07/29/18 1620      PT SHORT TERM GOAL #1   Title  Patient to be independent with initial HEP.    Time  3    Period  Weeks    Status  On-going        PT Long Term Goals - 07/29/18 1621      PT LONG TERM GOAL #1   Title  Patient to be independent with advanced HEP.    Time  6    Period  Weeks    Status  On-going      PT LONG TERM GOAL #2   Title  Patient to demonstrate B LE strength >=4+/5.    Time  6    Period  Weeks    Status  On-going      PT LONG TERM GOAL #3   Title  Patient to score <14 sec on TUG with LRAD.    Time  6    Period  Weeks    Status  On-going      PT LONG TERM GOAL #4   Title  Patient to demonstrate symmetrical step length, stance time, weight shift during ambulation with LRAD.    Time  6    Period  Weeks    Status  On-going            Plan - 07/31/18 1028    Clinical Impression Statement  Jose [name] questioning use of cane vs RW for community ambulation - pt able to demonstrate  good safe use of cane with good stability, therefore encouraged pt to use cane per comfort level and revert to RW with unfamiliar or crowded locations as well as if longer distances necessary where fatigue may be a factor. Able to progress therapeutic exercises to include more standing exercises, but fatigue noted after 10 reps with most standing exercises. Pt noting some increased "sensitivity" (denies increased pain) in L anterior hip/groin by end of session (states this "sensitivity" has been present since surgery), therefore ended session with ice pack to this area and will plan to assess further on next visit.    Rehab Potential  Good    PT Treatment/Interventions  ADLs/Self Care Home Management;Cryotherapy;Electrical Stimulation;Moist Heat;Ultrasound;DME Instruction;Gait training;Stair training;Functional mobility training;Therapeutic activities;Therapeutic exercise;Manual techniques;Patient/family education;Neuromuscular re-education;Balance training;Scar mobilization;Passive range of motion;Dry needling;Energy conservation;Splinting;Taping;Vasopneumatic Device    Consulted and Agree with Plan of Care  Patient       Patient will benefit from skilled therapeutic intervention in order to improve the following deficits and impairments:  Decreased scar mobility, Increased edema, Pain, Decreased strength, Decreased activity tolerance, Decreased balance, Difficulty walking, Improper body mechanics, Postural dysfunction  Visit Diagnosis: Pain in left hip  Muscle weakness (generalized)  Difficulty in walking, not elsewhere classified     Problem  List Patient Active Problem List   Diagnosis Date Noted  . Primary localized osteoarthritis of left hip 07/08/2018  . Primary localized osteoarthritis of hip 07/08/2018  . CAD (coronary artery disease) 04/16/2018  . Chest pain   . Abnormal cardiovascular stress test 04/14/2018  . Primary localized osteoarthrosis of right shoulder 11/05/2017  .  Primary localized osteoarthrosis of shoulder 11/05/2017  . Lumbar stenosis with neurogenic claudication 07/19/2017  . Primary localized osteoarthrosis of left shoulder 12/25/2016  . S/P shoulder replacement 12/25/2016  . Lumbar spondylolysis 12/17/2016  . Paresthesia 01/24/2016  . 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 extremity 03/29/2015  . Cervical spinal stenosis 02/04/2015  . Bronchiectasis without acute exacerbation ([place]) 01/11/2015  . Ulnar neuropathy at elbow 12/24/2014  . TIA (transient ischemic attack) 12/15/2014  . Hyperlipidemia 11/24/2014  . Hypoxia   . New onset a-fib ([place])   . MGUS (monoclonal gammopathy of unknown significance) 04/11/2012  . SEBORRHEIC KERATOSIS 11/09/2010  . CPK, ABNORMAL 09/03/2010  . DERMATOPHYTOSIS OF FOOT 08/03/2010  . MYALGIA 12/27/2009  . GOUT, UNSPECIFIED 10/04/2009  . HYPERTROPHY PROSTATE W/O UR OBST & OTH LUTS 12/14/2008  . ALLERGIC CONJUNCTIVITIS 12/03/2007  . TESTOSTERONE DEFICIENCY 11/14/2007  . Backache 04/22/2007  . HYPERLIPIDEMIA 12/26/2006  . OBESITY, NOS 12/26/2006  . 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 Hereditary and idiopathic peripheral neuropathy 12/26/2006  . HEARING LOSS NOS OR DEAFNESS 12/26/2006  . HYPERTENSION, BENIGN SYSTEMIC 12/26/2006  . OSTEOARTHRITIS, MULTI SITES 12/26/2006  . Musculoskeletal disorder and symptoms referable to neck 12/26/2006    Percival Spanish, PT, MPT 07/31/2018, 11:55 AM  [facility] [address]  [place], [place], [address] Phone: [phone]   Fax:  [phone]  Name: Janai Brannigan. MRN: [id number] Date of Birth: 1935/12/26

## 2018-08-04 ENCOUNTER — Ambulatory Visit: Payer: Medicare Other | Admitting: Physical Therapy

## 2018-08-04 ENCOUNTER — Other Ambulatory Visit: Payer: Medicare Other | Admitting: *Deleted

## 2018-08-04 DIAGNOSIS — R262 Difficulty in walking, not elsewhere classified: Secondary | ICD-10-CM

## 2018-08-04 DIAGNOSIS — M6281 Muscle weakness (generalized): Secondary | ICD-10-CM

## 2018-08-04 DIAGNOSIS — E785 Hyperlipidemia, unspecified: Secondary | ICD-10-CM

## 2018-08-04 DIAGNOSIS — M25552 Pain in left hip: Secondary | ICD-10-CM

## 2018-08-04 LAB — LIPID PANEL
CHOLESTEROL TOTAL: 85 mg/dL — AB (ref 100–199)
Chol/HDL Ratio: 2.1 ratio (ref 0.0–5.0)
HDL: 41 mg/dL (ref >39)
LDL Calculated: 32 mg/dL (ref 0–99)
Triglycerides: 59 mg/dL (ref 0–149)
VLDL Cholesterol Cal: 12 mg/dL (ref 5–40)

## 2018-08-04 LAB — HEPATIC FUNCTION PANEL
ALBUMIN: 4.2 g/dL (ref 3.5–4.7)
ALK PHOS: 136 IU/L — AB (ref 39–117)
ALT: 24 IU/L (ref 0–44)
AST: 25 IU/L (ref 0–40)
BILIRUBIN TOTAL: 0.6 mg/dL (ref 0.0–1.2)
Bilirubin, Direct: 0.22 mg/dL (ref 0.00–0.40)
Total Protein: 6.4 g/dL (ref 6.0–8.5)

## 2018-08-04 NOTE — Therapy (Signed)
[facility] [address]  [place], [place], [address] Phone: [phone]   Fax:  [phone]  Physical Therapy Treatment  Patient Details  Name: Jose [name]. MRN: [id number] Date of Birth: May 09, 1936 Referring Provider (PT): Marchia Bond, MD   Encounter Date: 08/04/2018  PT End of Session - 08/04/18 1455    Visit Number  4    Number of Visits  13    Date for PT Re-Evaluation  09/01/18    Authorization Type  Medicare & Tricare    PT Start Time  1400    PT Stop Time  9518   last 15 min on ice   PT Time Calculation (min)  57 min    Activity Tolerance  Patient tolerated treatment well    Behavior During Therapy  [facility] for tasks assessed/performed       Past Medical History:  Diagnosis Date  . Arthritis   . CAD (coronary artery disease)    04/15/18 65% dLAD, 20% pLAD, 30% 2nd diag, Normal EF  . Dysrhythmia    afib  . Gallstones 07/1996  . Headache    couple times a year  . History of CT scan of head 1997   small vessel disease  . History of ETT 05/2002    no EKG changes  . History of MRI of cervical spine 07/15/2002  . Hypertension   . Lumbar spondylolysis 12/17/2016  . MGUS (monoclonal gammopathy of unknown significance) 04/11/2012  . Peripheral neuropathy   . Pneumonia 10/2014  . PONV (postoperative nausea and vomiting)    once many years ago  . Primary localized osteoarthritis of left hip 07/08/2018  . Primary localized osteoarthrosis of left shoulder 12/25/2016  . Primary localized osteoarthrosis of right shoulder 11/05/2017  . Rectal bleeding 12/2002   colonoscopy  . Seizures ([place])    last seizure 1976  . Skin cancer   . Sleep apnea    uses CPAP  . Spinal stenosis, lumbar region with neurogenic claudication   . Stroke [facility])    ?tia ?afib  . TIA (transient ischemic attack)   . 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 extremity 03/29/2015    Past  Surgical History:  Procedure Laterality Date  . ANTERIOR CERVICAL DECOMP/DISCECTOMY FUSION N/A 02/04/2015   Procedure: ANTERIOR CERVICAL DECOMPRESSION/DISCECTOMY FUSION CERVICAL FIVE-SIX,CERVICAL SIX-SEVEN;  Surgeon: Karie Chimera, MD;  Location: [place] NEURO ORS;  Service: Neurosurgery;  Laterality: N/A;  . BACK SURGERY    . CATARACT EXTRACTION Bilateral   . COLONOSCOPY W/ POLYPECTOMY  04/19/2003   tubular adenoma  . EYE SURGERY     Bilateral Cataracts  . GREAT TOE ARTHRODESIS, INTERPHALANGEAL JOINT Left 1978  . JOINT REPLACEMENT    . joint replacement on right thumb Right   . LEFT HEART CATH AND CORONARY ANGIOGRAPHY N/A 04/15/2018   Procedure: LEFT HEART CATH AND CORONARY ANGIOGRAPHY;  Surgeon: Troy Sine, MD;  Location: [place] CV LAB;  Service: Cardiovascular;  Laterality: N/A;  . LUMBAR LAMINECTOMY/DECOMPRESSION MICRODISCECTOMY N/A 07/19/2017   Procedure: LAMINECTOMY AND FORAMINOTOMY  LUMBAR TWO- LUMBAR THREE, LUMBAR THREE- LUMBAR FOUR;  Surgeon: Ashok Pall, MD;  Location: [place];  Service: Neurosurgery;  Laterality: N/A;  . nerve transfer surgery to lwft hand Left    Nov. 2017  . SHOULDER ARTHROSCOPY W/ ROTATOR CUFF REPAIR Left 13   rotator cuff   . SHOULDER INJECTION  June 2012  left, [place]  . SKIN CANCER EXCISION  07/1993   left lateral arm  . SUPERFICIAL KERATECTOMY Right 05/1998   wrist  . thumb injection  June 2012   left, [place]  . TONSILLECTOMY AND ADENOIDECTOMY    . TOTAL HIP ARTHROPLASTY Left 07/08/2018  . TOTAL HIP ARTHROPLASTY Left 07/08/2018   Procedure: LEFT TOTAL HIP ARTHROPLASTY;  Surgeon: Marchia Bond, MD;  Location: [facility];  Service: Orthopedics;  Laterality: Left;  . TOTAL SHOULDER ARTHROPLASTY Left 12/25/2016   Procedure: TOTAL SHOULDER ARTHROPLASTY;  Surgeon: Marchia Bond, MD;  Location: [place];  Service: Orthopedics;  Laterality: Left;  . TOTAL SHOULDER ARTHROPLASTY Right 11/05/2017  . TOTAL SHOULDER ARTHROPLASTY Right 11/05/2017   Procedure: TOTAL  SHOULDER ARTHROPLASTY;  Surgeon: Marchia Bond, MD;  Location: [place];  Service: Orthopedics;  Laterality: Right;  . ulnar nerve transfer  12/1999   left  . ULNAR NERVE TRANSPOSITION Left     There were no vitals filed for this visit.  Subjective Assessment - 08/04/18 1448    Subjective  Pt relays his hip is doing pretty good, he has been using his cane more    Currently in Pain?  Yes    Pain Score  2     Pain Location  Hip    Pain Orientation  Left    Pain Descriptors / Indicators  Aching                       [facility] Adult PT Treatment/Exercise - 08/04/18 1448      Ambulation/Gait   Ambulation Distance (Feet)  146 Feet    Assistive device  Straight cane    Gait Pattern  Step-through pattern;Decreased weight shift to left    Ambulation Surface  Level;Indoor    Gait Comments  Improved hip and knee flexion with improved foot clearance but still slight decreased wt shift to L.      Exercises   Exercises  Knee/Hip      Knee/Hip Exercises: Stretches   Active Hamstring Stretch  Left;2 reps;30 seconds      Knee/Hip Exercises: Aerobic   Nustep  L2 x 6 min - LE only, avoiding past 90 degrees hip flexion      Knee/Hip Exercises: Standing   Heel Raises  Both;10 reps;3 seconds    Heel Raises Limitations  cues for quad & glute set; UE support on counter for balance    Hip Flexion  Both;15 reps;Knee bent   below 90 deg   Hip Abduction  Stengthening;AROM;Right;Both;Knee straight;1 set;15 reps    Abduction Limitations  cues for upright trunk and knee straight    Hip Extension  Stengthening;AROM;Both;15 reps;Knee straight    Extension Limitations  cues for upright trunk and knee straight    Lateral Step Up  Left;10 reps;Step Height: 4";Hand Hold: 2    Forward Step Up  Left;10 reps;Step Height: 4";Hand Hold: 1    Forward Step Up Limitations  UE support on counter as needed for balance      Knee/Hip Exercises: Seated   Long Arc Quad  Left;2 sets;10 reps    Long Arc Quad  Weight  2 lbs.    Hamstring Curl  Strengthening;Left;2 sets;10 reps    Hamstring Limitations  red TB    Sit to Sand  2 sets;5 reps      Knee/Hip Exercises: Supine   Bridges  Strengthening;Both;15 reps    Bridges Limitations  + hip ABD isometric with red TB; cues for 3  sec hold    Straight Leg Raises  Strengthening;Left;15 reps    Straight Leg Raises Limitations  cues for quad set prior to initation of lift    Other Supine Knee/Hip Exercises  --      Knee/Hip Exercises: Sidelying   Clams  for Lt hip, X 15      Modalities   Modalities  Cryotherapy      Cryotherapy   Number Minutes Cryotherapy  15 Minutes    Cryotherapy Location  Hip   L anterior hip   Type of Cryotherapy  Ice pack               PT Short Term Goals - 08/04/18 1503      PT SHORT TERM GOAL #1   Title  Patient to be independent with initial HEP.    Time  3    Period  Weeks    Status  On-going        PT Long Term Goals - 08/04/18 1503      PT LONG TERM GOAL #1   Title  Patient to be independent with advanced HEP.    Time  6    Period  Weeks    Status  On-going      PT LONG TERM GOAL #2   Title  Patient to demonstrate B LE strength >=4+/5.    Time  6    Period  Weeks    Status  Partially Met      PT LONG TERM GOAL #3   Title  Patient to score <14 sec on TUG with LRAD.    Time  6    Period  Weeks    Status  On-going      PT LONG TERM GOAL #4   Title  Patient to demonstrate symmetrical step length, stance time, weight shift during ambulation with LRAD.    Time  6    Period  Weeks    Status  On-going            Plan - 08/04/18 1500    Clinical Impression Statement  Pt is making good progress with PT thus far. He is improving his ambulation abilities with SPC, he is having less pain overall, and he was able to progress his strengthening with good tolerance. He does still fatique easy and require rest breaks but this is slowly improving. He will continue to benefit from PT.     Rehab  Potential  Good    PT Frequency  2x / week    PT Duration  6 weeks    PT Treatment/Interventions  ADLs/Self Care Home Management;Cryotherapy;Electrical Stimulation;Moist Heat;Ultrasound;DME Instruction;Gait training;Stair training;Functional mobility training;Therapeutic activities;Therapeutic exercise;Manual techniques;Patient/family education;Neuromuscular re-education;Balance training;Scar mobilization;Passive range of motion;Dry needling;Energy conservation;Splinting;Taping;Vasopneumatic Device    Consulted and Agree with Plan of Care  Patient       Patient will benefit from skilled therapeutic intervention in order to improve the following deficits and impairments:  Decreased scar mobility, Increased edema, Pain, Decreased strength, Decreased activity tolerance, Decreased balance, Difficulty walking, Improper body mechanics, Postural dysfunction  Visit Diagnosis: Pain in left hip  Muscle weakness (generalized)  Difficulty in walking, not elsewhere classified     Problem List Patient Active Problem List   Diagnosis Date Noted  . Primary localized osteoarthritis of left hip 07/08/2018  . Primary localized osteoarthritis of hip 07/08/2018  . CAD (coronary artery disease) 04/16/2018  . Chest pain   . Abnormal cardiovascular stress test 04/14/2018  . Primary localized  osteoarthrosis of right shoulder 11/05/2017  . Primary localized osteoarthrosis of shoulder 11/05/2017  . Lumbar stenosis with neurogenic claudication 07/19/2017  . Primary localized osteoarthrosis of left shoulder 12/25/2016  . S/P shoulder replacement 12/25/2016  . Lumbar spondylolysis 12/17/2016  . Paresthesia 01/24/2016  . 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 extremity 03/29/2015  . Cervical spinal stenosis 02/04/2015  . Bronchiectasis without acute exacerbation ([place]) 01/11/2015  . Ulnar neuropathy at elbow 12/24/2014  . TIA (transient ischemic attack) 12/15/2014  . Hyperlipidemia 11/24/2014  . Hypoxia    . New onset a-fib ([place])   . MGUS (monoclonal gammopathy of unknown significance) 04/11/2012  . SEBORRHEIC KERATOSIS 11/09/2010  . CPK, ABNORMAL 09/03/2010  . DERMATOPHYTOSIS OF FOOT 08/03/2010  . MYALGIA 12/27/2009  . GOUT, UNSPECIFIED 10/04/2009  . HYPERTROPHY PROSTATE W/O UR OBST & OTH LUTS 12/14/2008  . ALLERGIC CONJUNCTIVITIS 12/03/2007  . TESTOSTERONE DEFICIENCY 11/14/2007  . Backache 04/22/2007  . HYPERLIPIDEMIA 12/26/2006  . OBESITY, NOS 12/26/2006  . 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 Hereditary and idiopathic peripheral neuropathy 12/26/2006  . HEARING LOSS NOS OR DEAFNESS 12/26/2006  . HYPERTENSION, BENIGN SYSTEMIC 12/26/2006  . OSTEOARTHRITIS, MULTI SITES 12/26/2006  . Musculoskeletal disorder and symptoms referable to neck 12/26/2006    Debbe Odea, PT, DPT 08/04/2018, 3:04 PM  [facility] [address]  [employer] [place], [place], [address] Phone: [phone]   Fax:  [phone]  Name: Jose [name]. MRN: [id number] Date of Birth: 09/18/1936

## 2018-08-07 ENCOUNTER — Ambulatory Visit: Payer: Medicare Other | Admitting: Physical Therapy

## 2018-08-07 ENCOUNTER — Encounter: Payer: Self-pay | Admitting: Physical Therapy

## 2018-08-07 DIAGNOSIS — M6281 Muscle weakness (generalized): Secondary | ICD-10-CM

## 2018-08-07 DIAGNOSIS — M25552 Pain in left hip: Secondary | ICD-10-CM | POA: Diagnosis not present

## 2018-08-07 DIAGNOSIS — R262 Difficulty in walking, not elsewhere classified: Secondary | ICD-10-CM

## 2018-08-07 NOTE — Therapy (Signed)
[facility] [address]  [place], [place], [address] Phone: [phone]   Fax:  [phone]  Physical Therapy Treatment  Patient Details  Name: Ledford Goodson. MRN: [id number] Date of Birth: [birth year] Referring Provider (PT): Marchia Bond, MD   Encounter Date: 08/07/2018  PT End of Session - 08/07/18 1611    Visit Number  5    Number of Visits  13    Date for PT Re-Evaluation  09/01/18    Authorization Type  Medicare & Tricare    PT Start Time  8182    PT Stop Time  1618   ice pack   PT Time Calculation (min)  48 min    Equipment Utilized During Treatment  Gait belt    Activity Tolerance  Patient tolerated treatment well    Behavior During Therapy  WFL for tasks assessed/performed       Past Medical History:  Diagnosis Date  . Arthritis   . CAD (coronary artery disease)    04/15/18 65% dLAD, 20% pLAD, 30% 2nd diag, Normal EF  . Dysrhythmia    afib  . Gallstones 07/1996  . Headache    couple times a year  . History of CT scan of head 1997   small vessel disease  . History of ETT 05/2002    no EKG changes  . History of MRI of cervical spine 07/15/2002  . Hypertension   . Lumbar spondylolysis 12/17/2016  . MGUS (monoclonal gammopathy of unknown significance) 04/11/2012  . Peripheral neuropathy   . Pneumonia 10/2014  . PONV (postoperative nausea and vomiting)    once many years ago  . Primary localized osteoarthritis of left hip 07/08/2018  . Primary localized osteoarthrosis of left shoulder 12/25/2016  . Primary localized osteoarthrosis of right shoulder 11/05/2017  . Rectal bleeding 12/2002   colonoscopy  . Seizures ([place])    last seizure 1976  . Skin cancer   . Sleep apnea    uses CPAP  . Spinal stenosis, lumbar region with neurogenic claudication   . Stroke [facility])    ?tia ?afib  . TIA (transient ischemic attack)   . 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 Ulnar neuropathy at elbow of  left upper extremity 03/29/2015    Past Surgical History:  Procedure Laterality Date  . ANTERIOR CERVICAL DECOMP/DISCECTOMY FUSION N/A 02/04/2015   Procedure: ANTERIOR CERVICAL DECOMPRESSION/DISCECTOMY FUSION CERVICAL FIVE-SIX,CERVICAL SIX-SEVEN;  Surgeon: Karie Chimera, MD;  Location: [place] NEURO ORS;  Service: Neurosurgery;  Laterality: N/A;  . BACK SURGERY    . CATARACT EXTRACTION Bilateral   . COLONOSCOPY W/ POLYPECTOMY  04/19/2003   tubular adenoma  . EYE SURGERY     Bilateral Cataracts  . GREAT TOE ARTHRODESIS, INTERPHALANGEAL JOINT Left 1978  . JOINT REPLACEMENT    . joint replacement on right thumb Right   . LEFT HEART CATH AND CORONARY ANGIOGRAPHY N/A 04/15/2018   Procedure: LEFT HEART CATH AND CORONARY ANGIOGRAPHY;  Surgeon: Troy Sine, MD;  Location: [place] CV LAB;  Service: Cardiovascular;  Laterality: N/A;  . LUMBAR LAMINECTOMY/DECOMPRESSION MICRODISCECTOMY N/A 07/19/2017   Procedure: LAMINECTOMY AND FORAMINOTOMY  LUMBAR TWO- LUMBAR THREE, LUMBAR THREE- LUMBAR FOUR;  Surgeon: Ashok Pall, MD;  Location: [facility] [facility];  Service: Neurosurgery;  Laterality: N/A;  . nerve transfer surgery to lwft hand Left    Nov. 2017  . SHOULDER ARTHROSCOPY W/ ROTATOR CUFF REPAIR Left 13   rotator cuff   .  SHOULDER INJECTION  June 2012   left, [place]  . SKIN CANCER EXCISION  07/1993   left lateral arm  . SUPERFICIAL KERATECTOMY Right 05/1998   wrist  . thumb injection  June 2012   left, [place]  . TONSILLECTOMY AND ADENOIDECTOMY    . TOTAL HIP ARTHROPLASTY Left 07/08/2018  . TOTAL HIP ARTHROPLASTY Left 07/08/2018   Procedure: LEFT TOTAL HIP ARTHROPLASTY;  Surgeon: Marchia Bond, MD;  Location: [facility];  Service: Orthopedics;  Laterality: Left;  . TOTAL SHOULDER ARTHROPLASTY Left 12/25/2016   Procedure: TOTAL SHOULDER ARTHROPLASTY;  Surgeon: Marchia Bond, MD;  Location: [place];  Service: Orthopedics;  Laterality: Left;  . TOTAL SHOULDER ARTHROPLASTY Right 11/05/2017  . TOTAL SHOULDER  ARTHROPLASTY Right 11/05/2017   Procedure: TOTAL SHOULDER ARTHROPLASTY;  Surgeon: Marchia Bond, MD;  Location: [facility];  Service: Orthopedics;  Laterality: Right;  . ulnar nerve transfer  12/1999   left  . ULNAR NERVE TRANSPOSITION Left     There were no vitals filed for this visit.  Subjective Assessment - 08/07/18 1533    Subjective  Reports MD is no longer worried about infection. Also reports he was told to keep using the [facility]. Has one more MD appointment on Monday. Reports he has been feeling good. Is going to have eye surgery on the 28th.    Pertinent History  L ulnar neuropathy with transposition, TIA, stroke, lumbar spinal stenosis, skin CA, seizures, peripheral neuropathy, HTN, gallstones, dysrhythmia, CAD, B TSA, lumbar laminectomy/decompression, L heart carth & angio, L great toe arthrodesis. anterior cervical decomp/disectomy     Diagnostic tests  L hip xray 07/08/18: No postprocedure complication following left total hip joint    Patient Stated Goals  not sure, just improve healing    Currently in Pain?  Yes    Pain Score  2     Pain Location  Hip    Pain Orientation  Left    Pain Descriptors / Indicators  Aching;Burning    Pain Type  Acute pain;Surgical pain         [facility] PT Assessment - 08/07/18 0001      Strength   Strength Assessment Site  Hip;Knee;Ankle    Right/Left Hip  Right;Left    Right Hip Flexion  4/5    Right Hip ABduction  4+/5    Right Hip ADduction  4+/5    Left Hip Flexion  4-/5    Left Hip ABduction  4/5    Left Hip ADduction  4/5    Right/Left Knee  Right;Left    Right Knee Flexion  5/5    Right Knee Extension  5/5    Left Knee Flexion  5/5    Left Knee Extension  5/5    Right/Left Ankle  Right;Left    Right Ankle Dorsiflexion  4/5    Right Ankle Plantar Flexion  4+/5    Left Ankle Dorsiflexion  4/5    Left Ankle Plantar Flexion  4+/5      Timed Up and Go Test   Normal TUG (seconds)  13.3   with SPC                  [facility]  Adult PT Treatment/Exercise - 08/07/18 0001      Exercises   Exercises  Knee/Hip      Knee/Hip Exercises: Stretches   Gastroc Stretch  Right;Left;1 rep;30 seconds    Gastroc Stretch Limitations  prostretch at counter      Knee/Hip Exercises: Aerobic  Nustep  L2 x 6 min - LE only, avoiding past 90 degrees hip flexion      Knee/Hip Exercises: Standing   Hip Abduction  Stengthening;AROM;Right;Both;Knee straight;1 set;10 reps    Abduction Limitations  2#; cues for control and glute activation     Hip Extension  Stengthening;Knee straight;Right;Left;1 set;10 reps    Extension Limitations  2#    Forward Step Up  Left;1 set;15 reps;Hand Hold: 1;Step Height: 6";Limitations;20 reps;Step Height: 8"   decreased stability with 8"   Forward Step Up Limitations  R UE support on counter; cues to avoid ant trunk lean and slow eccentric lower    Functional Squat  1 set;10 reps;Limitations    Functional Squat Limitations  at counter top; avoiding 90 deg; cues for L weight shift      Cryotherapy   Number Minutes Cryotherapy  10 Minutes    Cryotherapy Location  Hip    Type of Cryotherapy  Ice pack             PT Education - 08/07/18 1611    Education Details  update to HEP    Person(s) Educated  Patient    Methods  Explanation;Demonstration;Tactile cues;Verbal cues;Handout    Comprehension  Verbalized understanding;Returned demonstration       PT Short Term Goals - 08/07/18 1536      PT SHORT TERM GOAL #1   Title  Patient to be independent with initial HEP.    Time  3    Period  Weeks    Status  On-going   reports intermittent compliance       PT Long Term Goals - 08/07/18 1612      PT LONG TERM GOAL #1   Title  Patient to be independent with advanced HEP.    Time  6    Period  Weeks    Status  On-going   advanced HEP administered today     PT LONG TERM GOAL #2   Title  Patient to demonstrate B LE strength >=4+/5.    Time  6    Period  Weeks    Status  Partially Met    improvement in L hip ABD and ADD     PT LONG TERM GOAL #3   Title  Patient to score <14 sec on TUG with LRAD.    Time  6    Period  Weeks    Status  Achieved   scored 13.3 sec on TUG with SPC     PT LONG TERM GOAL #4   Title  Patient to demonstrate symmetrical step length, stance time, weight shift during ambulation with LRAD.    Time  6    Period  Weeks    Status  On-going   still with antalgic gait pattern with decreased foot clearance on B LEs and lateral trunk lean           Plan - 08/07/18 1618    Clinical Impression Statement  Patient reporting MD appointment on Monday. Reports he is walking more often with the cane now and feels that he is improving. Updated goals- improvement noted in L hip ABD and ADD strength. Patient scored 13.3 sec on TUG with SPC today, putting him at a decreased risk of falls. Still with antalgic gait pattern with decreased foot clearance on B LEs and lateral trunk lean throughout. Worked on standing exercises today for LE strength. Patient with decreased hip and knee stability when performing step up/downs on 8" step,  better control with 6" step. Updated HEP to include standing hip abduction and extension, and gastroc stretch to improve foot clearance. Received ice pack to L hip at end of session for pain/edema relief. Mild diziziness upon sitting up which resolved after sitting at end of session.     PT Treatment/Interventions  ADLs/Self Care Home Management;Cryotherapy;Electrical Stimulation;Moist Heat;Ultrasound;DME Instruction;Gait training;Stair training;Functional mobility training;Therapeutic activities;Therapeutic exercise;Manual techniques;Patient/family education;Neuromuscular re-education;Balance training;Scar mobilization;Passive range of motion;Dry needling;Energy conservation;Splinting;Taping;Vasopneumatic Device    Consulted and Agree with Plan of Care  Patient       Patient will benefit from skilled therapeutic intervention in order to  improve the following deficits and impairments:  Decreased scar mobility, Increased edema, Pain, Decreased strength, Decreased activity tolerance, Decreased balance, Difficulty walking, Improper body mechanics, Postural dysfunction  Visit Diagnosis: Pain in left hip  Muscle weakness (generalized)  Difficulty in walking, not elsewhere classified     Problem List Patient Active Problem List   Diagnosis Date Noted  . Primary localized osteoarthritis of left hip 07/08/2018  . Primary localized osteoarthritis of hip 07/08/2018  . CAD (coronary artery disease) 04/16/2018  . Chest pain   . Abnormal cardiovascular stress test 04/14/2018  . Primary localized osteoarthrosis of right shoulder 11/05/2017  . Primary localized osteoarthrosis of shoulder 11/05/2017  . Lumbar stenosis with neurogenic claudication 07/19/2017  . Primary localized osteoarthrosis of left shoulder 12/25/2016  . S/P shoulder replacement 12/25/2016  . Lumbar spondylolysis 12/17/2016  . Paresthesia 01/24/2016  . 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 extremity 03/29/2015  . Cervical spinal stenosis 02/04/2015  . Bronchiectasis without acute exacerbation ([place]) 01/11/2015  . Ulnar neuropathy at elbow 12/24/2014  . TIA (transient ischemic attack) 12/15/2014  . Hyperlipidemia 11/24/2014  . Hypoxia   . New onset a-fib ([place])   . MGUS (monoclonal gammopathy of unknown significance) 04/11/2012  . SEBORRHEIC KERATOSIS 11/09/2010  . CPK, ABNORMAL 09/03/2010  . DERMATOPHYTOSIS OF FOOT 08/03/2010  . MYALGIA 12/27/2009  . GOUT, UNSPECIFIED 10/04/2009  . HYPERTROPHY PROSTATE W/O UR OBST & OTH LUTS 12/14/2008  . ALLERGIC CONJUNCTIVITIS 12/03/2007  . TESTOSTERONE DEFICIENCY 11/14/2007  . Backache 04/22/2007  . HYPERLIPIDEMIA 12/26/2006  . OBESITY, NOS 12/26/2006  . 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 Hereditary and idiopathic peripheral neuropathy 12/26/2006  . HEARING LOSS NOS OR DEAFNESS 12/26/2006  . HYPERTENSION, BENIGN  SYSTEMIC 12/26/2006  . OSTEOARTHRITIS, MULTI SITES 12/26/2006  . Musculoskeletal disorder and symptoms referable to neck 12/26/2006     Janene Harvey, PT, DPT 08/07/18 5:45 PM   [facility] [address]  [place], [place], [address] Phone: [phone]   Fax:  [phone]  Name: Chasyn Cinque. MRN: [id number] Date of Birth: 1936/07/05

## 2018-08-11 ENCOUNTER — Ambulatory Visit: Payer: Medicare Other | Admitting: Physical Therapy

## 2018-08-11 DIAGNOSIS — M25552 Pain in left hip: Secondary | ICD-10-CM | POA: Diagnosis not present

## 2018-08-11 DIAGNOSIS — M6281 Muscle weakness (generalized): Secondary | ICD-10-CM

## 2018-08-11 DIAGNOSIS — R262 Difficulty in walking, not elsewhere classified: Secondary | ICD-10-CM

## 2018-08-11 NOTE — Therapy (Addendum)
[facility] [address]  [place], [place], [address] Phone: [phone]   Fax:  [phone]  Physical Therapy Treatment  Patient Details  Name: Jose [name]. MRN: [id number] Date of Birth: [birth year] Referring Provider (PT): Marchia Bond, MD   Encounter Date: 08/11/2018  PT End of Session - 08/11/18 1435    Visit Number  6    Number of Visits  13    Date for PT Re-Evaluation  09/01/18    Authorization Type  Medicare & Tricare    PT Start Time  0159    PT Stop Time  9323   last 10 min on ice   PT Time Calculation (min)  48 min    Activity Tolerance  Patient tolerated treatment well    Behavior During Therapy  [facility] for tasks assessed/performed       Past Medical History:  Diagnosis Date  . Arthritis   . CAD (coronary artery disease)    04/15/18 65% dLAD, 20% pLAD, 30% 2nd diag, Normal EF  . Dysrhythmia    afib  . Gallstones 07/1996  . Headache    couple times a year  . History of CT scan of head 1997   small vessel disease  . History of ETT 05/2002    no EKG changes  . History of MRI of cervical spine 07/15/2002  . Hypertension   . Lumbar spondylolysis 12/17/2016  . MGUS (monoclonal gammopathy of unknown significance) 04/11/2012  . Peripheral neuropathy   . Pneumonia 10/2014  . PONV (postoperative nausea and vomiting)    once many years ago  . Primary localized osteoarthritis of left hip 07/08/2018  . Primary localized osteoarthrosis of left shoulder 12/25/2016  . Primary localized osteoarthrosis of right shoulder 11/05/2017  . Rectal bleeding 12/2002   colonoscopy  . Seizures ([place])    last seizure 1976  . Skin cancer   . Sleep apnea    uses CPAP  . Spinal stenosis, lumbar region with neurogenic claudication   . Stroke [facility])    ?tia ?afib  . TIA (transient ischemic attack)   . 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 extremity 03/29/2015    Past  Surgical History:  Procedure Laterality Date  . ANTERIOR CERVICAL DECOMP/DISCECTOMY FUSION N/A 02/04/2015   Procedure: ANTERIOR CERVICAL DECOMPRESSION/DISCECTOMY FUSION CERVICAL FIVE-SIX,CERVICAL SIX-SEVEN;  Surgeon: Karie Chimera, MD;  Location: [place] [facility];  Service: Neurosurgery;  Laterality: N/A;  . BACK SURGERY    . CATARACT EXTRACTION Bilateral   . COLONOSCOPY W/ POLYPECTOMY  04/19/2003   tubular adenoma  . EYE SURGERY     Bilateral Cataracts  . GREAT TOE ARTHRODESIS, INTERPHALANGEAL JOINT Left 1978  . JOINT REPLACEMENT    . joint replacement on right thumb Right   . LEFT HEART CATH AND CORONARY ANGIOGRAPHY N/A 04/15/2018   Procedure: LEFT HEART CATH AND CORONARY ANGIOGRAPHY;  Surgeon: Troy Sine, MD;  Location: [place] [facility];  Service: Cardiovascular;  Laterality: N/A;  . LUMBAR LAMINECTOMY/DECOMPRESSION MICRODISCECTOMY N/A 07/19/2017   Procedure: LAMINECTOMY AND FORAMINOTOMY  LUMBAR TWO- LUMBAR THREE, LUMBAR THREE- LUMBAR FOUR;  Surgeon: Ashok Pall, MD;  Location: [place];  Service: Neurosurgery;  Laterality: N/A;  . nerve transfer surgery to lwft hand Left    Nov. 2017  . SHOULDER ARTHROSCOPY W/ ROTATOR CUFF REPAIR Left 13   rotator cuff   . SHOULDER INJECTION  June 2012  left, [place]  . SKIN CANCER EXCISION  07/1993   left lateral arm  . SUPERFICIAL KERATECTOMY Right 05/1998   wrist  . thumb injection  June 2012   left, [place]  . TONSILLECTOMY AND ADENOIDECTOMY    . TOTAL HIP ARTHROPLASTY Left 07/08/2018  . TOTAL HIP ARTHROPLASTY Left 07/08/2018   Procedure: LEFT TOTAL HIP ARTHROPLASTY;  Surgeon: Marchia Bond, MD;  Location: [place];  Service: Orthopedics;  Laterality: Left;  . TOTAL SHOULDER ARTHROPLASTY Left 12/25/2016   Procedure: TOTAL SHOULDER ARTHROPLASTY;  Surgeon: Marchia Bond, MD;  Location: [place];  Service: Orthopedics;  Laterality: Left;  . TOTAL SHOULDER ARTHROPLASTY Right 11/05/2017  . TOTAL SHOULDER ARTHROPLASTY Right 11/05/2017   Procedure: TOTAL  SHOULDER ARTHROPLASTY;  Surgeon: Marchia Bond, MD;  Location: [place];  Service: Orthopedics;  Laterality: Right;  . ulnar nerve transfer  12/1999   left  . ULNAR NERVE TRANSPOSITION Left     There were no vitals filed for this visit.  Subjective Assessment - 08/11/18 1424    Subjective  Pt reports his hip is doing good, no pain until he goes from sit to stand but then after 3 steps the pain is gone. He relays MD was pleased with PT progress    Currently in Pain?  Yes    Pain Score  1     Pain Location  Hip    Pain Orientation  Left    Pain Descriptors / Indicators  Aching                       [facility] Adult PT Treatment/Exercise - 08/11/18 0001      Exercises   Exercises  Knee/Hip      Knee/Hip Exercises: Stretches   Active Hamstring Stretch  Left;2 reps;30 seconds    Gastroc Stretch  Right;Left;1 rep;30 seconds    Gastroc Stretch Limitations  prostretch at counter      Knee/Hip Exercises: Aerobic   Recumbent Bike  5 min warm up, seat pushed back and reclined to keep hip <90 deg      Knee/Hip Exercises: Standing   Knee Flexion  Left;2 sets;10 reps    Knee Flexion Limitations  2 lbs    Hip Abduction  Stengthening;AROM;Right;Both;Knee straight;1 set;10 reps    Abduction Limitations  2#; cues for control and glute activation     Hip Extension  Stengthening;Knee straight;Right;Left;1 set;10 reps    Extension Limitations  2#    Forward Step Up  Left;1 set;15 reps;Hand Hold: 1;Step Height: 6"    Forward Step Up Limitations  R UE support on counter; cues to avoid ant trunk lean and slow eccentric lower    Functional Squat  1 set;15 reps    Functional Squat Limitations  at counter top; avoiding 90 deg; cues for L weight shift      Knee/Hip Exercises: Seated   Long Arc Quad  Left;2 sets;10 reps    Long Arc Quad Weight  2 lbs.      Knee/Hip Exercises: Supine   Straight Leg Raises  Left;2 sets;10 reps      Cryotherapy   Number Minutes Cryotherapy  10 Minutes     Cryotherapy Location  Hip    Type of Cryotherapy  Ice pack               PT Short Term Goals - 08/07/18 1536      PT SHORT TERM GOAL #1   Title  Patient to be independent with  initial HEP.    Time  3    Period  Weeks    Status  On-going   reports intermittent compliance       PT Long Term Goals - 08/07/18 1612      PT LONG TERM GOAL #1   Title  Patient to be independent with advanced HEP.    Time  6    Period  Weeks    Status  On-going   advanced HEP administered today     PT LONG TERM GOAL #2   Title  Patient to demonstrate B LE strength >=4+/5.    Time  6    Period  Weeks    Status  Partially Met   improvement in L hip ABD and ADD     PT LONG TERM GOAL #3   Title  Patient to score <14 sec on TUG with LRAD.    Time  6    Period  Weeks    Status  Achieved   scored 13.3 sec on TUG with SPC     PT LONG TERM GOAL #4   Title  Patient to demonstrate symmetrical step length, stance time, weight shift during ambulation with LRAD.    Time  6    Period  Weeks    Status  On-going   still with antalgic gait pattern with decreased foot clearance on B LEs and lateral trunk lean           Plan - 08/11/18 1436    Clinical Impression Statement  Pt continues to make good progress with strength and ROM and was able to progress his therex today with increaesed reps and addition of resisted LAQ and H.S curls and SLR(no reistance) all with good tolerance. He wil continue to benefit from PT and PT will progress as able.    Rehab Potential  Good    PT Frequency  2x / week    PT Duration  6 weeks    PT Treatment/Interventions  ADLs/Self Care Home Management;Cryotherapy;Electrical Stimulation;Moist Heat;Ultrasound;DME Instruction;Gait training;Stair training;Functional mobility training;Therapeutic activities;Therapeutic exercise;Manual techniques;Patient/family education;Neuromuscular re-education;Balance training;Scar mobilization;Passive range of motion;Dry  needling;Energy conservation;Splinting;Taping;Vasopneumatic Device    PT Next Visit Plan  progress strength and standing tolerance    Consulted and Agree with Plan of Care  Patient       Patient will benefit from skilled therapeutic intervention in order to improve the following deficits and impairments:  Decreased scar mobility, Increased edema, Pain, Decreased strength, Decreased activity tolerance, Decreased balance, Difficulty walking, Improper body mechanics, Postural dysfunction  Visit Diagnosis: Pain in left hip  Muscle weakness (generalized)  Difficulty in walking, not elsewhere classified     Problem List Patient Active Problem List   Diagnosis Date Noted  . Primary localized osteoarthritis of left hip 07/08/2018  . Primary localized osteoarthritis of hip 07/08/2018  . CAD (coronary artery disease) 04/16/2018  . Chest pain   . Abnormal cardiovascular stress test 04/14/2018  . Primary localized osteoarthrosis of right shoulder 11/05/2017  . Primary localized osteoarthrosis of shoulder 11/05/2017  . Lumbar stenosis with neurogenic claudication 07/19/2017  . Primary localized osteoarthrosis of left shoulder 12/25/2016  . S/P shoulder replacement 12/25/2016  . Lumbar spondylolysis 12/17/2016  . Paresthesia 01/24/2016  . 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 extremity 03/29/2015  . Cervical spinal stenosis 02/04/2015  . Bronchiectasis without acute exacerbation ([place]) 01/11/2015  . Ulnar neuropathy at elbow 12/24/2014  . TIA (transient ischemic attack) 12/15/2014  . Hyperlipidemia 11/24/2014  . Hypoxia   .  New onset a-fib ([place])   . MGUS (monoclonal gammopathy of unknown significance) 04/11/2012  . SEBORRHEIC KERATOSIS 11/09/2010  . CPK, ABNORMAL 09/03/2010  . DERMATOPHYTOSIS OF FOOT 08/03/2010  . MYALGIA 12/27/2009  . GOUT, UNSPECIFIED 10/04/2009  . HYPERTROPHY PROSTATE W/O UR OBST & OTH LUTS 12/14/2008  . ALLERGIC CONJUNCTIVITIS 12/03/2007  . TESTOSTERONE  DEFICIENCY 11/14/2007  . Backache 04/22/2007  . HYPERLIPIDEMIA 12/26/2006  . OBESITY, NOS 12/26/2006  . 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 Hereditary and idiopathic peripheral neuropathy 12/26/2006  . HEARING LOSS NOS OR DEAFNESS 12/26/2006  . HYPERTENSION, BENIGN SYSTEMIC 12/26/2006  . OSTEOARTHRITIS, MULTI SITES 12/26/2006  . Musculoskeletal disorder and symptoms referable to neck 12/26/2006    Debbe Odea, PT, DPT 08/11/2018, 2:39 PM  [facility] [address]  [place], [place], [address] Phone: [phone]   Fax:  [phone]  Name: Jose [name]. MRN: [id number] Date of Birth: 11-Jun-1936   Addendum created to add KX modifier to billing charges as PT is medically necessary and he is at medicare cap.

## 2018-08-14 ENCOUNTER — Ambulatory Visit: Payer: Medicare Other | Admitting: Physical Therapy

## 2018-08-14 ENCOUNTER — Encounter: Payer: Self-pay | Admitting: Physical Therapy

## 2018-08-14 DIAGNOSIS — R262 Difficulty in walking, not elsewhere classified: Secondary | ICD-10-CM

## 2018-08-14 DIAGNOSIS — M6281 Muscle weakness (generalized): Secondary | ICD-10-CM

## 2018-08-14 DIAGNOSIS — M25552 Pain in left hip: Secondary | ICD-10-CM

## 2018-08-14 NOTE — Therapy (Signed)
[facility] [address]  [place], [place], [address] Phone: [phone]   Fax:  [phone]  Physical Therapy Treatment  Patient Details  Name: Jose [name]. MRN: [id number] Date of Birth: November 16, 1935 Referring Provider (PT): Marchia Bond, MD   Encounter Date: 08/14/2018  PT End of Session - 08/14/18 1534    Visit Number  7    Number of Visits  13    Date for PT Re-Evaluation  09/01/18    Authorization Type  Medicare & Tricare    PT Start Time  3532    PT Stop Time  9924    PT Time Calculation (min)  57 min    Activity Tolerance  Patient tolerated treatment well    Behavior During Therapy  [facility] for tasks assessed/performed       Past Medical History:  Diagnosis Date  . Arthritis   . CAD (coronary artery disease)    04/15/18 65% dLAD, 20% pLAD, 30% 2nd diag, Normal EF  . Dysrhythmia    afib  . Gallstones 07/1996  . Headache    couple times a year  . History of CT scan of head 1997   small vessel disease  . History of ETT 05/2002    no EKG changes  . History of MRI of cervical spine 07/15/2002  . Hypertension   . Lumbar spondylolysis 12/17/2016  . MGUS (monoclonal gammopathy of unknown significance) 04/11/2012  . Peripheral neuropathy   . Pneumonia 10/2014  . PONV (postoperative nausea and vomiting)    once many years ago  . Primary localized osteoarthritis of left hip 07/08/2018  . Primary localized osteoarthrosis of left shoulder 12/25/2016  . Primary localized osteoarthrosis of right shoulder 11/05/2017  . Rectal bleeding 12/2002   colonoscopy  . Seizures ([place])    last seizure 1976  . Skin cancer   . Sleep apnea    uses CPAP  . Spinal stenosis, lumbar region with neurogenic claudication   . Stroke [facility])    ?tia ?afib  . TIA (transient ischemic attack)   . 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 extremity 03/29/2015    Past Surgical History:   Procedure Laterality Date  . ANTERIOR CERVICAL DECOMP/DISCECTOMY FUSION N/A 02/04/2015   Procedure: ANTERIOR CERVICAL DECOMPRESSION/DISCECTOMY FUSION CERVICAL FIVE-SIX,CERVICAL SIX-SEVEN;  Surgeon: Karie Chimera, MD;  Location: [place] [facility];  Service: Neurosurgery;  Laterality: N/A;  . BACK SURGERY    . CATARACT EXTRACTION Bilateral   . COLONOSCOPY W/ POLYPECTOMY  04/19/2003   tubular adenoma  . EYE SURGERY     Bilateral Cataracts  . GREAT TOE ARTHRODESIS, INTERPHALANGEAL JOINT Left 1978  . JOINT REPLACEMENT    . joint replacement on right thumb Right   . LEFT HEART CATH AND CORONARY ANGIOGRAPHY N/A 04/15/2018   Procedure: LEFT HEART CATH AND CORONARY ANGIOGRAPHY;  Surgeon: Troy Sine, MD;  Location: [place] [facility];  Service: Cardiovascular;  Laterality: N/A;  . LUMBAR LAMINECTOMY/DECOMPRESSION MICRODISCECTOMY N/A 07/19/2017   Procedure: LAMINECTOMY AND FORAMINOTOMY  LUMBAR TWO- LUMBAR THREE, LUMBAR THREE- LUMBAR FOUR;  Surgeon: Ashok Pall, MD;  Location: [place];  Service: Neurosurgery;  Laterality: N/A;  . nerve transfer surgery to lwft hand Left    Nov. 2017  . SHOULDER ARTHROSCOPY W/ ROTATOR CUFF REPAIR Left 13   rotator cuff   . SHOULDER INJECTION  June 2012   left, [place]  . SKIN  CANCER EXCISION  07/1993   left lateral arm  . SUPERFICIAL KERATECTOMY Right 05/1998   wrist  . thumb injection  June 2012   left, [place]  . TONSILLECTOMY AND ADENOIDECTOMY    . TOTAL HIP ARTHROPLASTY Left 07/08/2018  . TOTAL HIP ARTHROPLASTY Left 07/08/2018   Procedure: LEFT TOTAL HIP ARTHROPLASTY;  Surgeon: Marchia Bond, MD;  Location: [place];  Service: Orthopedics;  Laterality: Left;  . TOTAL SHOULDER ARTHROPLASTY Left 12/25/2016   Procedure: TOTAL SHOULDER ARTHROPLASTY;  Surgeon: Marchia Bond, MD;  Location: [place];  Service: Orthopedics;  Laterality: Left;  . TOTAL SHOULDER ARTHROPLASTY Right 11/05/2017  . TOTAL SHOULDER ARTHROPLASTY Right 11/05/2017   Procedure: TOTAL SHOULDER ARTHROPLASTY;   Surgeon: Marchia Bond, MD;  Location: [place];  Service: Orthopedics;  Laterality: Right;  . ulnar nerve transfer  12/1999   left  . ULNAR NERVE TRANSPOSITION Left     There were no vitals filed for this visit.  Subjective Assessment - 08/14/18 1538    Subjective  Pt reports the only time he has pain is upon the first few steps after sitting for a period of time.    Pertinent History  L ulnar neuropathy with transposition, TIA, stroke, lumbar spinal stenosis, skin CA, seizures, peripheral neuropathy, HTN, gallstones, dysrhythmia, CAD, B TSA, lumbar laminectomy/decompression, L heart carth & angio, L great toe arthrodesis. anterior cervical decomp/disectomy     Currently in Pain?  No/denies                       [facility] Adult PT Treatment/Exercise - 08/14/18 1534      Exercises   Exercises  Knee/Hip      Knee/Hip Exercises: Standing   Heel Raises  Both;20 reps;3 seconds    Heel Raises Limitations  cues for quad & glute set; UE support on counter for balance    Forward Lunges  Right;Left;10 reps;3 seconds    Forward Lunges Limitations  shallow depth, cues to avoid knee passing in front of toes; 1 UE assist on counter    Functional Squat  15 reps;1 set    Functional Squat Limitations  counter squat - attempted heel raise on return to stand but limited lift on R, therefore deferred    Wall Squat  3 seconds;Limitations   8 reps   Wall Squat Limitations  stopped due to R knee discomfort    Other Standing Knee Exercises  B side-stepping & fwd/back monster walk at counter with red TB at ankles 4 x 89f each      Knee/Hip Exercises: Seated   Ball Squeeze  10 x 5"    Clamshell with TheraBand  Green   alt hip ABD/ER with green TB x10   Hamstring Curl  Strengthening;Left;15 reps    Hamstring Limitations  green TB      Modalities   Modalities  Cryotherapy      Cryotherapy   Number Minutes Cryotherapy  10 Minutes    Cryotherapy Location  Hip   L anterior hip   Type of  Cryotherapy  Ice pack               PT Short Term Goals - 08/14/18 1539      PT SHORT TERM GOAL #1   Title  Patient to be independent with initial HEP.    Time  3    Period  Weeks    Status  Achieved        PT Long Term Goals -  08/07/18 1612      PT LONG TERM GOAL #1   Title  Patient to be independent with advanced HEP.    Time  6    Period  Weeks    Status  On-going   advanced HEP administered today     PT LONG TERM GOAL #2   Title  Patient to demonstrate B LE strength >=4+/5.    Time  6    Period  Weeks    Status  Partially Met   improvement in L hip ABD and ADD     PT LONG TERM GOAL #3   Title  Patient to score <14 sec on TUG with LRAD.    Time  6    Period  Weeks    Status  Achieved   scored 13.3 sec on TUG with SPC     PT LONG TERM GOAL #4   Title  Patient to demonstrate symmetrical step length, stance time, weight shift during ambulation with LRAD.    Time  6    Period  Weeks    Status  On-going   still with antalgic gait pattern with decreased foot clearance on B LEs and lateral trunk lean           Plan - 08/14/18 1534    Clinical Impression Statement  Leotha feels like he is getting back to where he is doing things as he used to - only alteration is that he is still using the elevated toilet seat, but has lowered it to the lowest setting. Pt reporting no concerns with current HEP & reports he has been adding a second set with some exercises - STG now met. Good tolerance for exercise progression, with primary limitation more due to R knee pain from OA than L hip pain.     Rehab Potential  Good    PT Frequency  2x / week    PT Duration  6 weeks    PT Treatment/Interventions  ADLs/Self Care Home Management;Cryotherapy;Electrical Stimulation;Moist Heat;Ultrasound;DME Instruction;Gait training;Stair training;Functional mobility training;Therapeutic activities;Therapeutic exercise;Manual techniques;Patient/family education;Neuromuscular  re-education;Balance training;Scar mobilization;Passive range of motion;Dry needling;Energy conservation;Splinting;Taping;Vasopneumatic Device    PT Next Visit Plan  progress strength and standing tolerance    Consulted and Agree with Plan of Care  Patient       Patient will benefit from skilled therapeutic intervention in order to improve the following deficits and impairments:  Decreased scar mobility, Increased edema, Pain, Decreased strength, Decreased activity tolerance, Decreased balance, Difficulty walking, Improper body mechanics, Postural dysfunction  Visit Diagnosis: Pain in left hip  Muscle weakness (generalized)  Difficulty in walking, not elsewhere classified     Problem List Patient Active Problem List   Diagnosis Date Noted  . Primary localized osteoarthritis of left hip 07/08/2018  . Primary localized osteoarthritis of hip 07/08/2018  . CAD (coronary artery disease) 04/16/2018  . Chest pain   . Abnormal cardiovascular stress test 04/14/2018  . Primary localized osteoarthrosis of right shoulder 11/05/2017  . Primary localized osteoarthrosis of shoulder 11/05/2017  . Lumbar stenosis with neurogenic claudication 07/19/2017  . Primary localized osteoarthrosis of left shoulder 12/25/2016  . S/P shoulder replacement 12/25/2016  . Lumbar spondylolysis 12/17/2016  . Paresthesia 01/24/2016  . 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 extremity 03/29/2015  . Cervical spinal stenosis 02/04/2015  . Bronchiectasis without acute exacerbation ([place]) 01/11/2015  . Ulnar neuropathy at elbow 12/24/2014  . TIA (transient ischemic attack) 12/15/2014  . Hyperlipidemia 11/24/2014  . Hypoxia   .  New onset a-fib ([place])   . MGUS (monoclonal gammopathy of unknown significance) 04/11/2012  . SEBORRHEIC KERATOSIS 11/09/2010  . CPK, ABNORMAL 09/03/2010  . DERMATOPHYTOSIS OF FOOT 08/03/2010  . MYALGIA 12/27/2009  . GOUT, UNSPECIFIED 10/04/2009  . HYPERTROPHY PROSTATE W/O UR OBST & OTH  LUTS 12/14/2008  . ALLERGIC CONJUNCTIVITIS 12/03/2007  . TESTOSTERONE DEFICIENCY 11/14/2007  . Backache 04/22/2007  . HYPERLIPIDEMIA 12/26/2006  . OBESITY, NOS 12/26/2006  . 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 Hereditary and idiopathic peripheral neuropathy 12/26/2006  . HEARING LOSS NOS OR DEAFNESS 12/26/2006  . HYPERTENSION, BENIGN SYSTEMIC 12/26/2006  . OSTEOARTHRITIS, MULTI SITES 12/26/2006  . Musculoskeletal disorder and symptoms referable to neck 12/26/2006    Percival Spanish, PT, MPT 08/14/2018, 6:46 PM  [facility] [address]  [place], [place], [address] Phone: [phone]   Fax:  [phone]  Name: Watson Robarge. MRN: [id number] Date of Birth: [birth year]

## 2018-08-18 ENCOUNTER — Ambulatory Visit: Payer: Medicare Other | Admitting: Physical Therapy

## 2018-08-18 ENCOUNTER — Encounter: Payer: Self-pay | Admitting: Physical Therapy

## 2018-08-18 DIAGNOSIS — M25552 Pain in left hip: Secondary | ICD-10-CM | POA: Diagnosis not present

## 2018-08-18 DIAGNOSIS — M6281 Muscle weakness (generalized): Secondary | ICD-10-CM

## 2018-08-18 DIAGNOSIS — R262 Difficulty in walking, not elsewhere classified: Secondary | ICD-10-CM

## 2018-08-18 NOTE — Therapy (Signed)
[facility] [address]  [place], [place], [address] Phone: [phone]   Fax:  [phone]  Physical Therapy Treatment  Patient Details  Name: Jose [name]. MRN: [id number] Date of Birth: 03/30/1936 Referring Provider (PT): Marchia Bond, MD   Encounter Date: 08/18/2018  PT End of Session - 08/18/18 1400    Visit Number  8    Number of Visits  13    Date for PT Re-Evaluation  09/01/18    Authorization Type  Medicare & Tricare    PT Start Time  1400    PT Stop Time  1504    PT Time Calculation (min)  64 min    Activity Tolerance  Patient tolerated treatment well    Behavior During Therapy  WFL for tasks assessed/performed       Past Medical History:  Diagnosis Date  . Arthritis   . CAD (coronary artery disease)    04/15/18 65% dLAD, 20% pLAD, 30% 2nd diag, Normal EF  . Dysrhythmia    afib  . Gallstones 07/1996  . Headache    couple times a year  . History of CT scan of head 1997   small vessel disease  . History of ETT 05/2002    no EKG changes  . History of MRI of cervical spine 07/15/2002  . Hypertension   . Lumbar spondylolysis 12/17/2016  . MGUS (monoclonal gammopathy of unknown significance) 04/11/2012  . Peripheral neuropathy   . Pneumonia 10/2014  . PONV (postoperative nausea and vomiting)    once many years ago  . Primary localized osteoarthritis of left hip 07/08/2018  . Primary localized osteoarthrosis of left shoulder 12/25/2016  . Primary localized osteoarthrosis of right shoulder 11/05/2017  . Rectal bleeding 12/2002   colonoscopy  . Seizures ([place])    last seizure 1976  . Skin cancer   . Sleep apnea    uses CPAP  . Spinal stenosis, lumbar region with neurogenic claudication   . Stroke [facility])    ?tia ?afib  . TIA (transient ischemic attack)   . 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 extremity 03/29/2015    Past Surgical History:   Procedure Laterality Date  . ANTERIOR CERVICAL DECOMP/DISCECTOMY FUSION N/A 02/04/2015   Procedure: ANTERIOR CERVICAL DECOMPRESSION/DISCECTOMY FUSION CERVICAL FIVE-SIX,CERVICAL SIX-SEVEN;  Surgeon: Karie Chimera, MD;  Location: [place] NEURO ORS;  Service: Neurosurgery;  Laterality: N/A;  . BACK SURGERY    . CATARACT EXTRACTION Bilateral   . COLONOSCOPY W/ POLYPECTOMY  04/19/2003   tubular adenoma  . EYE SURGERY     Bilateral Cataracts  . GREAT TOE ARTHRODESIS, INTERPHALANGEAL JOINT Left 1978  . JOINT REPLACEMENT    . joint replacement on right thumb Right   . LEFT HEART CATH AND CORONARY ANGIOGRAPHY N/A 04/15/2018   Procedure: LEFT HEART CATH AND CORONARY ANGIOGRAPHY;  Surgeon: Troy Sine, MD;  Location: [place] CV LAB;  Service: Cardiovascular;  Laterality: N/A;  . LUMBAR LAMINECTOMY/DECOMPRESSION MICRODISCECTOMY N/A 07/19/2017   Procedure: LAMINECTOMY AND FORAMINOTOMY  LUMBAR TWO- LUMBAR THREE, LUMBAR THREE- LUMBAR FOUR;  Surgeon: Ashok Pall, MD;  Location: [place];  Service: Neurosurgery;  Laterality: N/A;  . nerve transfer surgery to lwft hand Left    Nov. 2017  . SHOULDER ARTHROSCOPY W/ ROTATOR CUFF REPAIR Left 13   rotator cuff   . SHOULDER INJECTION  June 2012   left, [place]  . SKIN  CANCER EXCISION  07/1993   left lateral arm  . SUPERFICIAL KERATECTOMY Right 05/1998   wrist  . thumb injection  June 2012   left, [place]  . TONSILLECTOMY AND ADENOIDECTOMY    . TOTAL HIP ARTHROPLASTY Left 07/08/2018  . TOTAL HIP ARTHROPLASTY Left 07/08/2018   Procedure: LEFT TOTAL HIP ARTHROPLASTY;  Surgeon: Marchia Bond, MD;  Location: [place];  Service: Orthopedics;  Laterality: Left;  . TOTAL SHOULDER ARTHROPLASTY Left 12/25/2016   Procedure: TOTAL SHOULDER ARTHROPLASTY;  Surgeon: Marchia Bond, MD;  Location: [place];  Service: Orthopedics;  Laterality: Left;  . TOTAL SHOULDER ARTHROPLASTY Right 11/05/2017  . TOTAL SHOULDER ARTHROPLASTY Right 11/05/2017   Procedure: TOTAL SHOULDER ARTHROPLASTY;   Surgeon: Marchia Bond, MD;  Location: [place];  Service: Orthopedics;  Laterality: Right;  . ulnar nerve transfer  12/1999   left  . ULNAR NERVE TRANSPOSITION Left     There were no vitals filed for this visit.  Subjective Assessment - 08/18/18 1405    Subjective  Pt w/o new complaints or concerns - still has very brief twinge of pain with fist standing up. Still using cane when he goes out, but no longer using cane in home.    Pertinent History  L ulnar neuropathy with transposition, TIA, stroke, lumbar spinal stenosis, skin CA, seizures, peripheral neuropathy, HTN, gallstones, dysrhythmia, CAD, B TSA, lumbar laminectomy/decompression, L heart carth & angio, L great toe arthrodesis. anterior cervical decomp/disectomy     Currently in Pain?  No/denies                       [facility] Adult PT Treatment/Exercise - 08/18/18 1400      Exercises   Exercises  Knee/Hip      Knee/Hip Exercises: Aerobic   Nustep  L3 x 6 min - LE only      Knee/Hip Exercises: Machines for Strengthening   Cybex Knee Extension  B LE 25# x10; B con/L ecc 20# x10    Cybex Knee Flexion  B LE 35# x10; B con/L ecc 25# x10      Knee/Hip Exercises: Standing   Hip Flexion  Stengthening;Right;Left;10 reps;Knee straight    Hip Flexion Limitations  looped red TB at ankles, cues for slower pace & good eccentric control; UE support on counter    Hip Abduction  Stengthening;Right;Left;10 reps;Knee straight    Abduction Limitations  looped red TB at ankles, cues for slower pace & good eccentric control; UE support on counter    Hip Extension  Stengthening;Right;Left;10 reps;Knee straight    Extension Limitations  looped red TB at ankles, cues for slower pace & good eccentric control; UE support on counter    Step Down  Left;10 reps;Hand Hold: 2;Step Height: 6";Step Height: 4"   1 set each - lateral & forward   Step Down Limitations  lateral eccentric lowering with light touch to floor from 6" step; fwd eccentric  lowering from 4" step    SLS with Vectors  L SLS with toe tap to 3 colored discs (ant/lat/post) x10 with R UE support on SPC; L SLS on blue foam oval with toe tap to 3 balance pebbles (ant/lat/post) x5 with R UE support on [facility] & CGA/SBA of PT    Other Standing Knee Exercises  Alt toe clears from Airex pad to 9" step x20      Knee/Hip Exercises: Seated   Other Seated Knee/Hip Exercises  Fitter L leg press (1 black/1 blue) x10  Modalities   Modalities  Cryotherapy      Cryotherapy   Number Minutes Cryotherapy  10 Minutes    Cryotherapy Location  Hip   L anterior hip   Type of Cryotherapy  Ice pack      Manual Therapy   Manual Therapy  Soft tissue mobilization;Myofascial release    Manual therapy comments  slight hooklying    Soft tissue mobilization  STM to L hip flexors & proximal quads    Myofascial Release  manual TPR to L distal iliopsoas & prox RF             PT Education - 08/18/18 1424    Education Details  HEP update - red TB added for standing 3 way SLR    Person(s) Educated  Patient    Methods  Explanation;Demonstration    Comprehension  Verbalized understanding;Returned demonstration       PT Short Term Goals - 08/14/18 1539      PT SHORT TERM GOAL #1   Title  Patient to be independent with initial HEP.    Time  3    Period  Weeks    Status  Achieved        PT Long Term Goals - 08/07/18 1612      PT LONG TERM GOAL #1   Title  Patient to be independent with advanced HEP.    Time  6    Period  Weeks    Status  On-going   advanced HEP administered today     PT LONG TERM GOAL #2   Title  Patient to demonstrate B LE strength >=4+/5.    Time  6    Period  Weeks    Status  Partially Met   improvement in L hip ABD and ADD     PT LONG TERM GOAL #3   Title  Patient to score <14 sec on TUG with LRAD.    Time  6    Period  Weeks    Status  Achieved   scored 13.3 sec on TUG with SPC     PT LONG TERM GOAL #4   Title  Patient to demonstrate  symmetrical step length, stance time, weight shift during ambulation with LRAD.    Time  6    Period  Weeks    Status  On-going   still with antalgic gait pattern with decreased foot clearance on B LEs and lateral trunk lean           Plan - 08/18/18 1408    Clinical Impression Statement  Continued strengthening progression with good tolerance and provided red TB for standing 3 way SLR at home. Increased emphasis on SLS stability and proprioceptive training with use of uneven surfaces - pt requring closer gurading during these activities. Pt still noting brief pain initially on rising sit to stand - increased muscle tension/TPs noted in proximal quads and distal hip flexors which were addressed with manual therapy with some relief of tension noted. Pt also reporting sensation of unevenness when he walks - leg length assessment reveals L leg apparently ~1/4-1/2 inch longer than R, therefore provided pt with 1/4 inch L for R shoe & will assess response on next visit.    Rehab Potential  Good    PT Frequency  2x / week    PT Duration  6 weeks    PT Treatment/Interventions  ADLs/Self Care Home Management;Cryotherapy;Electrical Stimulation;Moist Heat;Ultrasound;DME Instruction;Gait training;Stair training;Functional mobility training;Therapeutic activities;Therapeutic exercise;Manual techniques;Patient/family education;Neuromuscular  re-education;Balance training;Scar mobilization;Passive range of motion;Dry needling;Energy conservation;Splinting;Taping;Vasopneumatic Device    PT Next Visit Plan  progress strength and standing tolerance    Consulted and Agree with Plan of Care  Patient       Patient will benefit from skilled therapeutic intervention in order to improve the following deficits and impairments:  Decreased scar mobility, Increased edema, Pain, Decreased strength, Decreased activity tolerance, Decreased balance, Difficulty walking, Improper body mechanics, Postural dysfunction  Visit  Diagnosis: Pain in left hip  Muscle weakness (generalized)  Difficulty in walking, not elsewhere classified     Problem List Patient Active Problem List   Diagnosis Date Noted  . Primary localized osteoarthritis of left hip 07/08/2018  . Primary localized osteoarthritis of hip 07/08/2018  . CAD (coronary artery disease) 04/16/2018  . Chest pain   . Abnormal cardiovascular stress test 04/14/2018  . Primary localized osteoarthrosis of right shoulder 11/05/2017  . Primary localized osteoarthrosis of shoulder 11/05/2017  . Lumbar stenosis with neurogenic claudication 07/19/2017  . Primary localized osteoarthrosis of left shoulder 12/25/2016  . S/P shoulder replacement 12/25/2016  . Lumbar spondylolysis 12/17/2016  . Paresthesia 01/24/2016  . 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 extremity 03/29/2015  . Cervical spinal stenosis 02/04/2015  . Bronchiectasis without acute exacerbation ([place]) 01/11/2015  . Ulnar neuropathy at elbow 12/24/2014  . TIA (transient ischemic attack) 12/15/2014  . Hyperlipidemia 11/24/2014  . Hypoxia   . New onset a-fib ([place])   . MGUS (monoclonal gammopathy of unknown significance) 04/11/2012  . SEBORRHEIC KERATOSIS 11/09/2010  . CPK, ABNORMAL 09/03/2010  . DERMATOPHYTOSIS OF FOOT 08/03/2010  . MYALGIA 12/27/2009  . GOUT, UNSPECIFIED 10/04/2009  . HYPERTROPHY PROSTATE W/O UR OBST & OTH LUTS 12/14/2008  . ALLERGIC CONJUNCTIVITIS 12/03/2007  . TESTOSTERONE DEFICIENCY 11/14/2007  . Backache 04/22/2007  . HYPERLIPIDEMIA 12/26/2006  . OBESITY, NOS 12/26/2006  . 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 Hereditary and idiopathic peripheral neuropathy 12/26/2006  . HEARING LOSS NOS OR DEAFNESS 12/26/2006  . HYPERTENSION, BENIGN SYSTEMIC 12/26/2006  . OSTEOARTHRITIS, MULTI SITES 12/26/2006  . Musculoskeletal disorder and symptoms referable to neck 12/26/2006    Percival Spanish, PT, MPT 08/18/2018, 3:12 PM  [facility] [address]  [place], [place], [address] Phone: [phone]   Fax:  [phone]  Name: Jose [name]. MRN: [id number] Date of Birth: 1935/11/15

## 2018-08-21 ENCOUNTER — Encounter: Payer: Self-pay | Admitting: Physical Therapy

## 2018-08-21 ENCOUNTER — Ambulatory Visit: Payer: Medicare Other | Admitting: Physical Therapy

## 2018-08-21 DIAGNOSIS — M25552 Pain in left hip: Secondary | ICD-10-CM | POA: Diagnosis not present

## 2018-08-21 DIAGNOSIS — M6281 Muscle weakness (generalized): Secondary | ICD-10-CM

## 2018-08-21 DIAGNOSIS — R262 Difficulty in walking, not elsewhere classified: Secondary | ICD-10-CM

## 2018-08-21 NOTE — Therapy (Signed)
[facility] [address]  [place], [place], [address] Phone: [phone]   Fax:  [phone]  Physical Therapy Treatment  Patient Details  Name: Jose [name]. MRN: [id number] Date of Birth: 06/10/1936 Referring Provider (PT): Marchia Bond, MD   Encounter Date: 08/21/2018  PT End of Session - 08/21/18 1614    Visit Number  9    Number of Visits  13    Date for PT Re-Evaluation  09/01/18    Authorization Type  Medicare & Tricare    PT Start Time  0175    PT Stop Time  1622   ice pack   PT Time Calculation (min)  51 min    Equipment Utilized During Treatment  Gait belt    Activity Tolerance  Patient tolerated treatment well    Behavior During Therapy  WFL for tasks assessed/performed       Past Medical History:  Diagnosis Date  . Arthritis   . CAD (coronary artery disease)    04/15/18 65% dLAD, 20% pLAD, 30% 2nd diag, Normal EF  . Dysrhythmia    afib  . Gallstones 07/1996  . Headache    couple times a year  . History of CT scan of head 1997   small vessel disease  . History of ETT 05/2002    no EKG changes  . History of MRI of cervical spine 07/15/2002  . Hypertension   . Lumbar spondylolysis 12/17/2016  . MGUS (monoclonal gammopathy of unknown significance) 04/11/2012  . Peripheral neuropathy   . Pneumonia 10/2014  . PONV (postoperative nausea and vomiting)    once many years ago  . Primary localized osteoarthritis of left hip 07/08/2018  . Primary localized osteoarthrosis of left shoulder 12/25/2016  . Primary localized osteoarthrosis of right shoulder 11/05/2017  . Rectal bleeding 12/2002   colonoscopy  . Seizures ([place])    last seizure 1976  . Skin cancer   . Sleep apnea    uses CPAP  . Spinal stenosis, lumbar region with neurogenic claudication   . Stroke [name])    ?tia ?afib  . TIA (transient ischemic attack)   . 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 Ulnar neuropathy at elbow of  left upper extremity 03/29/2015    Past Surgical History:  Procedure Laterality Date  . ANTERIOR CERVICAL DECOMP/DISCECTOMY FUSION N/A 02/04/2015   Procedure: ANTERIOR CERVICAL DECOMPRESSION/DISCECTOMY FUSION CERVICAL FIVE-SIX,CERVICAL SIX-SEVEN;  Surgeon: Karie Chimera, MD;  Location: [place] NEURO ORS;  Service: Neurosurgery;  Laterality: N/A;  . BACK SURGERY    . CATARACT EXTRACTION Bilateral   . COLONOSCOPY W/ POLYPECTOMY  04/19/2003   tubular adenoma  . EYE SURGERY     Bilateral Cataracts  . GREAT TOE ARTHRODESIS, INTERPHALANGEAL JOINT Left 1978  . JOINT REPLACEMENT    . joint replacement on right thumb Right   . LEFT HEART CATH AND CORONARY ANGIOGRAPHY N/A 04/15/2018   Procedure: LEFT HEART CATH AND CORONARY ANGIOGRAPHY;  Surgeon: Troy Sine, MD;  Location: [place] CV LAB;  Service: Cardiovascular;  Laterality: N/A;  . LUMBAR LAMINECTOMY/DECOMPRESSION MICRODISCECTOMY N/A 07/19/2017   Procedure: LAMINECTOMY AND FORAMINOTOMY  LUMBAR TWO- LUMBAR THREE, LUMBAR THREE- LUMBAR FOUR;  Surgeon: Ashok Pall, MD;  Location: [place];  Service: Neurosurgery;  Laterality: N/A;  . nerve transfer surgery to lwft hand Left    Nov. 2017  . SHOULDER ARTHROSCOPY W/ ROTATOR CUFF REPAIR Left 13   rotator cuff   .  SHOULDER INJECTION  June 2012   left, [place]  . SKIN CANCER EXCISION  07/1993   left lateral arm  . SUPERFICIAL KERATECTOMY Right 05/1998   wrist  . thumb injection  June 2012   left, [place]  . TONSILLECTOMY AND ADENOIDECTOMY    . TOTAL HIP ARTHROPLASTY Left 07/08/2018  . TOTAL HIP ARTHROPLASTY Left 07/08/2018   Procedure: LEFT TOTAL HIP ARTHROPLASTY;  Surgeon: Marchia Bond, MD;  Location: [place];  Service: Orthopedics;  Laterality: Left;  . TOTAL SHOULDER ARTHROPLASTY Left 12/25/2016   Procedure: TOTAL SHOULDER ARTHROPLASTY;  Surgeon: Marchia Bond, MD;  Location: [place];  Service: Orthopedics;  Laterality: Left;  . TOTAL SHOULDER ARTHROPLASTY Right 11/05/2017  . TOTAL SHOULDER  ARTHROPLASTY Right 11/05/2017   Procedure: TOTAL SHOULDER ARTHROPLASTY;  Surgeon: Marchia Bond, MD;  Location: [place];  Service: Orthopedics;  Laterality: Right;  . ulnar nerve transfer  12/1999   left  . ULNAR NERVE TRANSPOSITION Left     There were no vitals filed for this visit.  Subjective Assessment - 08/21/18 1532    Subjective  Patient reporting that he is feeling pretty good. Noticed a little bit of improvement from [facility] last session. Noted benefit from heel lift.     Pertinent History  L ulnar neuropathy with transposition, TIA, stroke, lumbar spinal stenosis, skin CA, seizures, peripheral neuropathy, HTN, gallstones, dysrhythmia, CAD, B TSA, lumbar laminectomy/decompression, L heart carth & angio, L great toe arthrodesis. anterior cervical decomp/disectomy     Diagnostic tests  L hip xray 07/08/18: No postprocedure complication following left total hip joint    Patient Stated Goals  not sure, just improve healing    Currently in Pain?  Yes    Pain Score  2     Pain Location  Groin    Pain Orientation  Right    Pain Descriptors / Indicators  Aching    Pain Type  Acute pain;Surgical pain                       [facility] Adult PT Treatment/Exercise - 08/21/18 0001      Ambulation/Gait   Ambulation Distance (Feet)  180 Feet    Assistive device  None    Gait Pattern  Step-through pattern;Decreased weight shift to left    Ambulation Surface  Level;Indoor    Stairs  Yes    Stairs Assistance  4: Min guard    Stair Management Technique  One rail Right    Number of Stairs  26    Height of Stairs  8    Gait Comments  gait training with stepping on compliant surface, over obstacles, around obstacles without AD; stair climbing with 1 handrail and cues to correct anteriior trunk lean and decreased eccentric control      Knee/Hip Exercises: Aerobic   Nustep  L4 x 6 min - LE only      Knee/Hip Exercises: Standing   Wall Squat  1 set;10 reps;Limitations    Wall Squat  Limitations  avoiding deep squat to avoid pain in knees; cues to decrease speed    SLS with Vectors  R & L SLS with hands hovering over counter top with cues to contract glutes to avoid hip drop x4 min      Knee/Hip Exercises: Supine   Bridges  Strengthening;Both;1 set;15 reps;Limitations    Bridges Limitations  red TB around knees      Cryotherapy   Number Minutes Cryotherapy  10 Minutes  Cryotherapy Location  Hip   L   Type of Cryotherapy  Ice pack      Manual Therapy   Manual Therapy  Soft tissue mobilization;Passive ROM    Manual therapy comments  LEs propped in supine    Soft tissue mobilization  STM to L iliacus, medial quad, adductors    Myofascial Release  manual TPR to L adductors    Passive ROM  L hip abduction abduction stretch to tolerance 3x30"   considerable twitching with stretch but no pain              PT Short Term Goals - 08/14/18 1539      PT SHORT TERM GOAL #1   Title  Patient to be independent with initial HEP.    Time  3    Period  Weeks    Status  Achieved        PT Long Term Goals - 08/07/18 1612      PT LONG TERM GOAL #1   Title  Patient to be independent with advanced HEP.    Time  6    Period  Weeks    Status  On-going   advanced HEP administered today     PT LONG TERM GOAL #2   Title  Patient to demonstrate B LE strength >=4+/5.    Time  6    Period  Weeks    Status  Partially Met   improvement in L hip ABD and ADD     PT LONG TERM GOAL #3   Title  Patient to score <14 sec on TUG with LRAD.    Time  6    Period  Weeks    Status  Achieved   scored 13.3 sec on TUG with SPC     PT LONG TERM GOAL #4   Title  Patient to demonstrate symmetrical step length, stance time, weight shift during ambulation with LRAD.    Time  6    Period  Weeks    Status  On-going   still with antalgic gait pattern with decreased foot clearance on B LEs and lateral trunk lean           Plan - 08/21/18 1614    Clinical Impression  Statement  Patient arrived to session reporting benefit from [facility] and heel lift last session. Tolerated STM to L hip flexor, quad, and adductors as well as passive hip abduction stretch to tolerance. Patient noting good benefit from this stretch, also with visible muscle twitching. Worked on gait training through obstacle course without use of cane with good stability and no imbalance. Intermittent cues still required for improved foot clearance on L LE. Tried stair climbing for the first time today as patient reporting he has not gone up a staircase- used 1 handrail and provided cues to correct anterior trunk lean and decreased eccentric control. Patient required rest break after stair climbing and wall squats today d/t fatigue. Ended session with ice pack to L hip. No complaints at end of session.    PT Treatment/Interventions  ADLs/Self Care Home Management;Cryotherapy;Electrical Stimulation;Moist Heat;Ultrasound;DME Instruction;Gait training;Stair training;Functional mobility training;Therapeutic activities;Therapeutic exercise;Manual techniques;Patient/family education;Neuromuscular re-education;Balance training;Scar mobilization;Passive range of motion;Dry needling;Energy conservation;Splinting;Taping;Vasopneumatic Device    Consulted and Agree with Plan of Care  Patient       Patient will benefit from skilled therapeutic intervention in order to improve the following deficits and impairments:  Decreased scar mobility, Increased edema, Pain, Decreased strength, Decreased activity tolerance, Decreased balance, Difficulty  walking, Improper body mechanics, Postural dysfunction  Visit Diagnosis: Pain in left hip  Muscle weakness (generalized)  Difficulty in walking, not elsewhere classified     Problem List Patient Active Problem List   Diagnosis Date Noted  . Primary localized osteoarthritis of left hip 07/08/2018  . Primary localized osteoarthritis of hip 07/08/2018  . CAD (coronary artery  disease) 04/16/2018  . Chest pain   . Abnormal cardiovascular stress test 04/14/2018  . Primary localized osteoarthrosis of right shoulder 11/05/2017  . Primary localized osteoarthrosis of shoulder 11/05/2017  . Lumbar stenosis with neurogenic claudication 07/19/2017  . Primary localized osteoarthrosis of left shoulder 12/25/2016  . S/P shoulder replacement 12/25/2016  . Lumbar spondylolysis 12/17/2016  . Paresthesia 01/24/2016  . 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 extremity 03/29/2015  . Cervical spinal stenosis 02/04/2015  . Bronchiectasis without acute exacerbation ([place]) 01/11/2015  . Ulnar neuropathy at elbow 12/24/2014  . TIA (transient ischemic attack) 12/15/2014  . Hyperlipidemia 11/24/2014  . Hypoxia   . New onset a-fib ([place])   . MGUS (monoclonal gammopathy of unknown significance) 04/11/2012  . SEBORRHEIC KERATOSIS 11/09/2010  . CPK, ABNORMAL 09/03/2010  . DERMATOPHYTOSIS OF FOOT 08/03/2010  . MYALGIA 12/27/2009  . GOUT, UNSPECIFIED 10/04/2009  . HYPERTROPHY PROSTATE W/O UR OBST & OTH LUTS 12/14/2008  . ALLERGIC CONJUNCTIVITIS 12/03/2007  . TESTOSTERONE DEFICIENCY 11/14/2007  . Backache 04/22/2007  . HYPERLIPIDEMIA 12/26/2006  . OBESITY, NOS 12/26/2006  . 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 Hereditary and idiopathic peripheral neuropathy 12/26/2006  . HEARING LOSS NOS OR DEAFNESS 12/26/2006  . HYPERTENSION, BENIGN SYSTEMIC 12/26/2006  . OSTEOARTHRITIS, MULTI SITES 12/26/2006  . Musculoskeletal disorder and symptoms referable to neck 12/26/2006     Janene Harvey, PT, DPT 08/21/18 4:24 PM    [facility] [address]  [place], [place], [address] Phone: [phone]   Fax:  [phone]  Name: Jose [name]. MRN: [id number] Date of Birth: 11-15-1935

## 2018-08-22 ENCOUNTER — Encounter: Payer: Self-pay | Admitting: Cardiovascular Disease

## 2018-08-22 ENCOUNTER — Ambulatory Visit (INDEPENDENT_AMBULATORY_CARE_PROVIDER_SITE_OTHER): Payer: Medicare Other | Admitting: Cardiovascular Disease

## 2018-08-22 VITALS — BP 118/60 | HR 52 | Ht 72.0 in | Wt 221.8 lb

## 2018-08-22 DIAGNOSIS — I480 Paroxysmal atrial fibrillation: Secondary | ICD-10-CM | POA: Diagnosis not present

## 2018-08-22 DIAGNOSIS — I2510 Atherosclerotic heart disease of native coronary artery without angina pectoris: Secondary | ICD-10-CM | POA: Diagnosis not present

## 2018-08-22 DIAGNOSIS — E7800 Pure hypercholesterolemia, unspecified: Secondary | ICD-10-CM | POA: Diagnosis not present

## 2018-08-22 DIAGNOSIS — I10 Essential (primary) hypertension: Secondary | ICD-10-CM

## 2018-08-22 NOTE — Progress Notes (Signed)
Chief Complaint  Patient presents with  . Follow-up    CAD   History of Present Illness: 82 yo male with history of HTN, HLD, paroxysmal atrial fibrillation, TIA, MGUS and CAD here today for cardiac follow up. He had been followed by Dr. Atilano Median in [facility] until June 2019. He was admitted to [facility] in June 2019 with fluttering in his chest with associated chest pain. Troponin was negative. Nuclear stress test with possible ischemia. Cardiac cath June 2019 with mild to moderate non-obstructive disease in the LAD, mild disease in the RCA. Echo 04/15/18 with LVEF=60-65%. No significant valve disease. He was discharged on Imdur.   He is here today for follow up. The patient denies any chest pain, dyspnea, palpitations, lower extremity edema, orthopnea, PND, near syncope or syncope. He has dizziness when standing. This lasts for several seconds.   Primary Care Physician: Karlene Einstein, MD  Past Medical History:  Diagnosis Date  . Arthritis   . CAD (coronary artery disease)    04/15/18 65% dLAD, 20% pLAD, 30% 2nd diag, Normal EF  . Dysrhythmia    afib  . Gallstones 07/1996  . Headache    couple times a year  . History of CT scan of head 1997   small vessel disease  . History of ETT 05/2002    no EKG changes  . History of MRI of cervical spine 07/15/2002  . Hypertension   . Lumbar spondylolysis 12/17/2016  . MGUS (monoclonal gammopathy of unknown significance) 04/11/2012  . Peripheral neuropathy   . Pneumonia 10/2014  . PONV (postoperative nausea and vomiting)    once many years ago  . Primary localized osteoarthritis of left hip 07/08/2018  . Primary localized osteoarthrosis of left shoulder 12/25/2016  . Primary localized osteoarthrosis of right shoulder 11/05/2017  . Rectal bleeding 12/2002   colonoscopy  . Seizures ([place])    last seizure 1976  . Skin cancer   . Sleep apnea    uses CPAP  . Spinal stenosis, lumbar region with neurogenic claudication   . Stroke [facility])    ?tia ?afib  .  TIA (transient ischemic attack)   . 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 ANTERIOR CERVICAL DECOMP/DISCECTOMY FUSION N/A 02/04/2015   Procedure: ANTERIOR CERVICAL DECOMPRESSION/DISCECTOMY FUSION CERVICAL FIVE-SIX,CERVICAL SIX-SEVEN;  Surgeon: Karie Chimera, MD;  Location: [place] [facility];  Service: Neurosurgery;  Laterality: N/A;  . BACK SURGERY    . CATARACT EXTRACTION Bilateral   . COLONOSCOPY W/ POLYPECTOMY  04/19/2003   tubular adenoma  . EYE SURGERY     Bilateral Cataracts  . GREAT TOE ARTHRODESIS, INTERPHALANGEAL JOINT Left 1978  . JOINT REPLACEMENT    . joint replacement on right thumb Right   . LEFT HEART CATH AND CORONARY ANGIOGRAPHY N/A 04/15/2018   Procedure: LEFT HEART CATH AND CORONARY ANGIOGRAPHY;  Surgeon: Troy Sine, MD;  Location: [facility];  Service: Cardiovascular;  Laterality: N/A;  . LUMBAR LAMINECTOMY/DECOMPRESSION MICRODISCECTOMY N/A 07/19/2017   Procedure: LAMINECTOMY AND FORAMINOTOMY  LUMBAR TWO- LUMBAR THREE, LUMBAR THREE- LUMBAR FOUR;  Surgeon: Ashok Pall, MD;  Location: [place];  Service: Neurosurgery;  Laterality: N/A;  . nerve transfer surgery to lwft hand Left    Nov. 2017  . SHOULDER ARTHROSCOPY W/ ROTATOR CUFF REPAIR Left 13   rotator cuff   . SHOULDER INJECTION  June  2012   left, [place]  . SKIN CANCER EXCISION  07/1993   left lateral arm  . SUPERFICIAL KERATECTOMY Right 05/1998   wrist  . thumb injection  June 2012   left, [place]  . TONSILLECTOMY AND ADENOIDECTOMY    . TOTAL HIP ARTHROPLASTY Left 07/08/2018  . TOTAL HIP ARTHROPLASTY Left 07/08/2018   Procedure: LEFT TOTAL HIP ARTHROPLASTY;  Surgeon: Marchia Bond, MD;  Location: [place];  Service: Orthopedics;  Laterality: Left;  . TOTAL SHOULDER ARTHROPLASTY Left 12/25/2016   Procedure: TOTAL SHOULDER ARTHROPLASTY;  Surgeon: Marchia Bond, MD;  Location: [place];   Service: Orthopedics;  Laterality: Left;  . TOTAL SHOULDER ARTHROPLASTY Right 11/05/2017  . TOTAL SHOULDER ARTHROPLASTY Right 11/05/2017   Procedure: TOTAL SHOULDER ARTHROPLASTY;  Surgeon: Marchia Bond, MD;  Location: [place];  Service: Orthopedics;  Laterality: Right;  . ulnar nerve transfer  12/1999   left  . ULNAR NERVE TRANSPOSITION Left     Current Outpatient Medications  Medication Sig Dispense Refill  . amLODipine (NORVASC) 5 MG tablet Take 5 mg by mouth daily.    Marland Kitchen aspirin EC 81 MG EC tablet Take 1 tablet (81 mg total) by mouth daily.    Marland Kitchen atorvastatin (LIPITOR) 40 MG tablet Take 1 tablet (40 mg total) by mouth daily. 90 tablet 3  . ELIQUIS 5 MG TABS tablet TAKE 1 TABLET TWICE A DAY 180 tablet 2  . HYDROcodone-acetaminophen (NORCO) 10-325 MG tablet Take 1 tablet by mouth every 6 (six) hours as needed. 28 tablet 0  . isosorbide mononitrate (IMDUR) 30 MG 24 hr tablet Take 1 tablet (30 mg total) by mouth 2 (two) times daily. 180 tablet 3  . labetalol (NORMODYNE) 200 MG tablet Take 100 mg by mouth 2 (two) times daily.    Marland Kitchen latanoprost (XALATAN) 0.005 % ophthalmic solution Place 1 drop into the right eye at bedtime.    Marland Kitchen losartan (COZAAR) 50 MG tablet Take 50 mg by mouth daily.    . meclizine (ANTIVERT) 25 MG tablet Take 1 tablet (25 mg total) by mouth 3 (three) times daily as needed. 20 tablet 0  . Multiple Vitamins-Minerals (PRESERVISION AREDS 2) CAPS Take 1 capsule by mouth 2 (two) times daily.     . nitroGLYCERIN (NITROSTAT) 0.4 MG SL tablet Place 1 tablet (0.4 mg total) under the tongue every 5 (five) minutes x 3 doses as needed for chest pain. 25 tablet 1  . Polyethyl Glycol-Propyl Glycol (LUBRICANT EYE DROPS) 0.4-0.3 % SOLN Place 1 drop into the right eye at bedtime.    Carren Rang BALANCE 0.6 % SOLN Place 1 drop into both eyes 4 (four) times daily.     No current facility-administered medications for this visit.     Allergies  Allergen Reactions  . Maprotiline Other (See  Comments)    TETRACYCLIC ANTIDEPRESSANTS UNSPECIFIED REACTION (patient is unaware of allergy) TETRACYCLIC ANTIDEPRESSANTS UNSPECIFIED REACTION (patient is unaware of allergy)  . Tetracycline Hcl Itching and Rash    Rash on hands    Social History   Socioeconomic History  . Marital status: Married    Spouse name: [name]  . Number of children: 2  . Years of education: 3  . Highest education level: Not on file  Occupational History  . Occupation: retired    Fish farm manager: UNEMPLOYED  Social Needs  . Financial resource strain: Not on file  . Food insecurity:    Worry: Not on file    Inability: Not on file  . Transportation  needs:    Medical: Not on file    Non-medical: Not on file  Tobacco Use  . Smoking status: Former Smoker    Packs/day: 2.00    Years: 13.00    Pack years: 26.00    Types: Cigarettes    Last attempt to quit: 03/05/1968    Years since quitting: 50.4  . Smokeless tobacco: Never Used  Substance and Sexual Activity  . Alcohol use: Yes    Alcohol/week: 2.0 standard drinks    Types: 2 Glasses of wine per week  . Drug use: No  . Sexual activity: Not Currently  Lifestyle  . Physical activity:    Days per week: Not on file    Minutes per session: Not on file  . Stress: Not on file  Relationships  . Social connections:    Talks on phone: Not on file    Gets together: Not on file    Attends religious service: Not on file    Active member of club or organization: Not on file    Attends meetings of clubs or organizations: Not on file    Relationship status: Not on file  . Intimate partner violence:    Fear of current or ex partner: Not on file    Emotionally abused: Not on file    Physically abused: Not on file    Forced sexual activity: Not on file  Other Topics Concern  . Not on file  Social History Narrative   Lives with 4th wife, [name], married 2010   Retired, organizational psychology   [place], [place],  [name], 2 children   Daughter, [place], in [place], 2 children   Patient is right handed   Patient drinks approximately 3-4 cups of caffeine daily    Family History  Problem Relation Age of Onset  . Dementia Mother 2       vascular  . Transient ischemic attack Mother   . Dementia Father        after hip fracture  . Hypertension Sister        skin cancer  . Seizures Neg Hx     Review of Systems:  As stated in the HPI and otherwise negative.   BP 118/60   Pulse (!) 52   Ht 6' (1.829 m)   Wt 221 lb 12.8 oz (100.6 kg)   SpO2 95%   BMI 30.08 kg/m   Physical Examination: General: Well developed, well nourished, NAD  HEENT: OP clear, mucus membranes moist  SKIN: warm, dry. No rashes. Neuro: No focal deficits  Musculoskeletal: Muscle strength 5/5 all ext  Psychiatric: Mood and affect normal  Neck: No JVD, no carotid bruits, no thyromegaly, no lymphadenopathy.  Lungs:Clear bilaterally, no wheezes, rhonci, crackles Cardiovascular: Regular rate and rhythm. No murmurs, gallops or rubs. Abdomen:Soft. Bowel sounds present. Non-tender.  Extremities: No lower extremity edema. Pulses are 2 + in the bilateral DP/PT.  EKG:  EKG is not ordered today. The ekg ordered today demonstrates   Recent Labs: 07/09/2018: BUN 18; Creatinine, Ser 0.82; Hemoglobin 11.2; Platelets 163; Potassium 3.7; Sodium 140 08/04/2018: ALT 24   Lipid Panel    Component Value Date/Time   CHOL 85 (L) 08/04/2018 0838   TRIG 59 08/04/2018 0838   HDL 41 08/04/2018 0838   CHOLHDL 2.1 08/04/2018 0838   CHOLHDL 3.8 04/15/2018 0351   VLDL 39 04/15/2018 0351   LDLCALC 32 08/04/2018 0838   LDLDIRECT 120 (H) 08/03/2010 1719  Wt Readings from Last 3 Encounters:  08/22/18 221 lb 12.8 oz (100.6 kg)  07/09/18 220 lb 0.3 oz (99.8 kg)  06/26/18 224 lb (101.6 kg)     Other studies Reviewed: Additional studies/ records that were reviewed today include: . Review of the above records demonstrates:    Assessment  and Plan:   1. CAD with angina: Chest pain is stable. Will continue ASA, statin, beta blocker, Imdur, Norvasc.   2. Hyperlipidemia: LDL at goal. Continue statin.   3. HTN: BP controlled.   4. Atrial fibrillation, paroxysmal: Sinus today. Continue beta blocker and Eliquis.   5. Dizziness: He was orthostatic in primary care. He has been limiting his fluid intake due to having to urinate frequently. He will push his po intake. Some of his dizziness could be related to his bradycardia. If he has worsening of his dizziness, would consider a cardiac monitor and adjustment of his beta blocker.   Current medicines are reviewed at length with the patient today.  The patient does not have concerns regarding medicines.  The following changes have been made:  no change  Labs/ tests ordered today include:  No orders of the defined types were placed in this encounter.    Disposition:   FU with me  in 6 months   Signed, Lauree Chandler, MD 08/22/2018 10:48 AM    [facility] [place], [place], [place]  [address] Phone: [phone]; Fax: [phone]

## 2018-08-22 NOTE — Patient Instructions (Signed)

## 2018-08-25 ENCOUNTER — Encounter: Payer: Medicare Other | Admitting: Physical Therapy

## 2018-08-27 MED FILL — AMOXICILLIN 500 MG CAPSULE: 500 | 1 days supply | Qty: 4 | Fill #0

## 2018-08-28 ENCOUNTER — Ambulatory Visit: Payer: Medicare Other | Admitting: Physical Therapy

## 2018-08-28 ENCOUNTER — Encounter: Payer: Self-pay | Admitting: Physical Therapy

## 2018-08-28 DIAGNOSIS — M25552 Pain in left hip: Secondary | ICD-10-CM | POA: Diagnosis not present

## 2018-08-28 DIAGNOSIS — R262 Difficulty in walking, not elsewhere classified: Secondary | ICD-10-CM

## 2018-08-28 DIAGNOSIS — M6281 Muscle weakness (generalized): Secondary | ICD-10-CM

## 2018-08-28 NOTE — Therapy (Addendum)
[facility] [address]  [place], [place], [address] Phone: [phone]   Fax:  [phone]  Physical Therapy Treatment / Discharge  Patient Details  Name: Jose [name]. MRN: [id number] Date of Birth: Dec 21, 1935 Referring Provider (PT): Marchia Bond, MD   Encounter Date: 08/28/2018  PT End of Session - 08/28/18 1529    Visit Number  10    Number of Visits  13    Date for PT Re-Evaluation  09/01/18    Authorization Type  Medicare & Tricare    PT Start Time  1529    PT Stop Time  1605    PT Time Calculation (min)  36 min    Equipment Utilized During Treatment  Gait belt    Activity Tolerance  Patient tolerated treatment well    Behavior During Therapy  WFL for tasks assessed/performed       Past Medical History:  Diagnosis Date  . Arthritis   . CAD (coronary artery disease)    04/15/18 65% dLAD, 20% pLAD, 30% 2nd diag, Normal EF  . Dysrhythmia    afib  . Gallstones 07/1996  . Headache    couple times a year  . History of CT scan of head 1997   small vessel disease  . History of ETT 05/2002    no EKG changes  . History of MRI of cervical spine 07/15/2002  . Hypertension   . Lumbar spondylolysis 12/17/2016  . MGUS (monoclonal gammopathy of unknown significance) 04/11/2012  . Peripheral neuropathy   . Pneumonia 10/2014  . PONV (postoperative nausea and vomiting)    once many years ago  . Primary localized osteoarthritis of left hip 07/08/2018  . Primary localized osteoarthrosis of left shoulder 12/25/2016  . Primary localized osteoarthrosis of right shoulder 11/05/2017  . Rectal bleeding 12/2002   colonoscopy  . Seizures ([place])    last seizure 1976  . Skin cancer   . Sleep apnea    uses CPAP  . Spinal stenosis, lumbar region with neurogenic claudication   . Stroke [facility]    ?tia ?afib  . TIA (transient ischemic attack)   . 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 Ulnar neuropathy at elbow of  left upper extremity 03/29/2015    Past Surgical History:  Procedure Laterality Date  . ANTERIOR CERVICAL DECOMP/DISCECTOMY FUSION N/A 02/04/2015   Procedure: ANTERIOR CERVICAL DECOMPRESSION/DISCECTOMY FUSION CERVICAL FIVE-SIX,CERVICAL SIX-SEVEN;  Surgeon: Karie Chimera, MD;  Location: [facility];  Service: Neurosurgery;  Laterality: N/A;  . BACK SURGERY    . CATARACT EXTRACTION Bilateral   . COLONOSCOPY W/ POLYPECTOMY  04/19/2003   tubular adenoma  . EYE SURGERY     Bilateral Cataracts  . GREAT TOE ARTHRODESIS, INTERPHALANGEAL JOINT Left 1978  . JOINT REPLACEMENT    . joint replacement on right thumb Right   . LEFT HEART CATH AND CORONARY ANGIOGRAPHY N/A 04/15/2018   Procedure: LEFT HEART CATH AND CORONARY ANGIOGRAPHY;  Surgeon: Troy Sine, MD;  Location: [place] [facility];  Service: Cardiovascular;  Laterality: N/A;  . LUMBAR LAMINECTOMY/DECOMPRESSION MICRODISCECTOMY N/A 07/19/2017   Procedure: LAMINECTOMY AND FORAMINOTOMY  LUMBAR TWO- LUMBAR THREE, LUMBAR THREE- LUMBAR FOUR;  Surgeon: Ashok Pall, MD;  Location: [place];  Service: Neurosurgery;  Laterality: N/A;  . nerve transfer surgery to lwft hand Left    Nov. 2017  . SHOULDER ARTHROSCOPY W/ ROTATOR CUFF REPAIR Left 13   rotator cuff   .  SHOULDER INJECTION  June 2012   left, [place]  . SKIN CANCER EXCISION  07/1993   left lateral arm  . SUPERFICIAL KERATECTOMY Right 05/1998   wrist  . thumb injection  June 2012   left, [place]  . TONSILLECTOMY AND ADENOIDECTOMY    . TOTAL HIP ARTHROPLASTY Left 07/08/2018  . TOTAL HIP ARTHROPLASTY Left 07/08/2018   Procedure: LEFT TOTAL HIP ARTHROPLASTY;  Surgeon: Marchia Bond, MD;  Location: [place];  Service: Orthopedics;  Laterality: Left;  . TOTAL SHOULDER ARTHROPLASTY Left 12/25/2016   Procedure: TOTAL SHOULDER ARTHROPLASTY;  Surgeon: Marchia Bond, MD;  Location: [facility];  Service: Orthopedics;  Laterality: Left;  . TOTAL SHOULDER ARTHROPLASTY Right 11/05/2017  . TOTAL SHOULDER  ARTHROPLASTY Right 11/05/2017   Procedure: TOTAL SHOULDER ARTHROPLASTY;  Surgeon: Marchia Bond, MD;  Location: [facility];  Service: Orthopedics;  Laterality: Right;  . ulnar nerve transfer  12/1999   left  . ULNAR NERVE TRANSPOSITION Left     There were no vitals filed for this visit.  Subjective Assessment - 08/28/18 1535    Subjective  Pt stating he feels ready to transition to HEP today. Pt reports pain has been subsiding recently with no pain noted over past few days.    Pertinent History  L ulnar neuropathy with transposition, TIA, stroke, lumbar spinal stenosis, skin CA, seizures, peripheral neuropathy, HTN, gallstones, dysrhythmia, CAD, B TSA, lumbar laminectomy/decompression, L heart carth & angio, L great toe arthrodesis. anterior cervical decomp/disectomy     Diagnostic tests  L hip xray 07/08/18: No postprocedure complication following left total hip joint    Patient Stated Goals  not sure, just improve healing    Currently in Pain?  No/denies    Pain Score  0-No pain         [facility] PT Assessment - 08/28/18 1529      Assessment   Medical Diagnosis  S/P L THA    Referring Provider (PT)  Marchia Bond, MD    Onset Date/Surgical Date  07/08/18    Next MD Visit  09/12/18      Observation/Other Assessments   Focus on Therapeutic Outcomes (FOTO)   Hip - 66% (34% limitation)      Strength   Right Hip Flexion  5/5    Right Hip Extension  5/5    Right Hip ABduction  5/5    Right Hip ADduction  5/5    Left Hip Flexion  5/5    Left Hip Extension  5/5    Left Hip ABduction  5/5    Left Hip ADduction  4+/5    Right Knee Flexion  5/5    Right Knee Extension  5/5    Left Knee Flexion  5/5    Left Knee Extension  5/5    Right Ankle Dorsiflexion  4+/5    Right Ankle Plantar Flexion  4+/5    Left Ankle Dorsiflexion  4+/5    Left Ankle Plantar Flexion  4+/5      Ambulation/Gait   Ambulation Distance (Feet)  100 Feet    Assistive device  None    Gait Pattern  Step-through  pattern;Within Functional Limits    Ambulation Surface  Level;Indoor      Timed Up and Go Test   Normal TUG (seconds)  7.68   w/o AD                  [facility] Adult PT Treatment/Exercise - 08/28/18 1529      Exercises  Exercises  Knee/Hip      Knee/Hip Exercises: Aerobic   Nustep  L6 x 6 min - LE only               PT Short Term Goals - 08/14/18 1539      PT SHORT TERM GOAL #1   Title  Patient to be independent with initial HEP.    Time  3    Period  Weeks    Status  Achieved        PT Long Term Goals - 08/28/18 1533      PT LONG TERM GOAL #1   Title  Patient to be independent with advanced HEP.    Time  6    Period  Weeks    Status  Achieved      PT LONG TERM GOAL #2   Title  Patient to demonstrate B LE strength >/= 4+/5.    Time  6    Period  Weeks    Status  Achieved      PT LONG TERM GOAL #3   Title  Patient to score <14 sec on TUG with LRAD.    Time  6    Period  Weeks    Status  Achieved      PT LONG TERM GOAL #4   Title  Patient to demonstrate symmetrical step length, stance time, weight shift during ambulation with LRAD.    Time  6    Period  Weeks    Status  Achieved            Plan - 08/28/18 1559    Clinical Impression Statement  Neythan has demonstrated excellent progress with PT with overall hip pain essentially resolved. Pt now ambulating with good gait pattern w/o AD. B LE strength grossly 4+/5 to 5/5. All goals met at this time and pt feels confident transitioning to HEP, therefore with proceed with discharge from PT today.    Rehab Potential  Good    PT Treatment/Interventions  ADLs/Self Care Home Management;Cryotherapy;Electrical Stimulation;Moist Heat;Ultrasound;DME Instruction;Gait training;Stair training;Functional mobility training;Therapeutic activities;Therapeutic exercise;Manual techniques;Patient/family education;Neuromuscular re-education;Balance training;Scar mobilization;Passive range of motion;Dry  needling;Energy conservation;Splinting;Taping;Vasopneumatic Device    PT Next Visit Plan  Discharge    Consulted and Agree with Plan of Care  Patient       Patient will benefit from skilled therapeutic intervention in order to improve the following deficits and impairments:  Decreased scar mobility, Increased edema, Pain, Decreased strength, Decreased activity tolerance, Decreased balance, Difficulty walking, Improper body mechanics, Postural dysfunction  Visit Diagnosis: Pain in left hip  Muscle weakness (generalized)  Difficulty in walking, not elsewhere classified     Problem List Patient Active Problem List   Diagnosis Date Noted  . Primary localized osteoarthritis of left hip 07/08/2018  . Primary localized osteoarthritis of hip 07/08/2018  . CAD (coronary artery disease) 04/16/2018  . Chest pain   . Abnormal cardiovascular stress test 04/14/2018  . Primary localized osteoarthrosis of right shoulder 11/05/2017  . Primary localized osteoarthrosis of shoulder 11/05/2017  . Lumbar stenosis with neurogenic claudication 07/19/2017  . Primary localized osteoarthrosis of left shoulder 12/25/2016  . S/P shoulder replacement 12/25/2016  . Lumbar spondylolysis 12/17/2016  . Paresthesia 01/24/2016  . 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 extremity 03/29/2015  . Cervical spinal stenosis 02/04/2015  . Bronchiectasis without acute exacerbation ([place]) 01/11/2015  . Ulnar neuropathy at elbow 12/24/2014  . TIA (transient ischemic attack) 12/15/2014  . Hyperlipidemia 11/24/2014  . Hypoxia   .  New onset a-fib ([place])   . MGUS (monoclonal gammopathy of unknown significance) 04/11/2012  . SEBORRHEIC KERATOSIS 11/09/2010  . CPK, ABNORMAL 09/03/2010  . DERMATOPHYTOSIS OF FOOT 08/03/2010  . MYALGIA 12/27/2009  . GOUT, UNSPECIFIED 10/04/2009  . HYPERTROPHY PROSTATE W/O UR OBST & OTH LUTS 12/14/2008  . ALLERGIC CONJUNCTIVITIS 12/03/2007  . TESTOSTERONE DEFICIENCY 11/14/2007  . Backache  04/22/2007  . HYPERLIPIDEMIA 12/26/2006  . OBESITY, NOS 12/26/2006  . 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 Hereditary and idiopathic peripheral neuropathy 12/26/2006  . HEARING LOSS NOS OR DEAFNESS 12/26/2006  . HYPERTENSION, BENIGN SYSTEMIC 12/26/2006  . OSTEOARTHRITIS, MULTI SITES 12/26/2006  . Musculoskeletal disorder and symptoms referable to neck 12/26/2006    PHYSICAL THERAPY DISCHARGE SUMMARY  Visits from Start of Care: 10  Current functional level related to goals / functional outcomes:   Refer to above clinical impression.    Remaining deficits:   As above.   Education / Equipment:   HEP  Plan: Patient agrees to discharge.  Patient goals were met. Patient is being discharged due to meeting the stated rehab goals.  ?????      Percival Spanish, PT, MPT 08/28/2018, 4:10 PM  [facility] [address]  [place], [place], [address] Phone: [phone]   Fax:  [phone]  Name: Jose [name]. MRN: [id number] Date of Birth: [birth year]

## 2018-10-13 MED FILL — MOXIFLOXACIN HCL 0.5 % SOLN: 0.5 | 19 days supply | Qty: 3 | Fill #0

## 2018-10-13 MED FILL — PREDNISOLONE AC 1% EYE DROP: 1 | 50 days supply | Qty: 10 | Fill #0

## 2018-11-07 IMAGING — DX DG SHOULDER 2+V PORT*R*
1 series · 2 of 2 positions shown · non-contrast
Comparison: CT right shoulder dated July 24, 2016.

CLINICAL DATA: Right shoulder arthroplasty.

EXAM:
PORTABLE RIGHT SHOULDER

[Series 1: shoulder · 0.14mm/px · 2 of 2 slices shown]
[im 1/2]
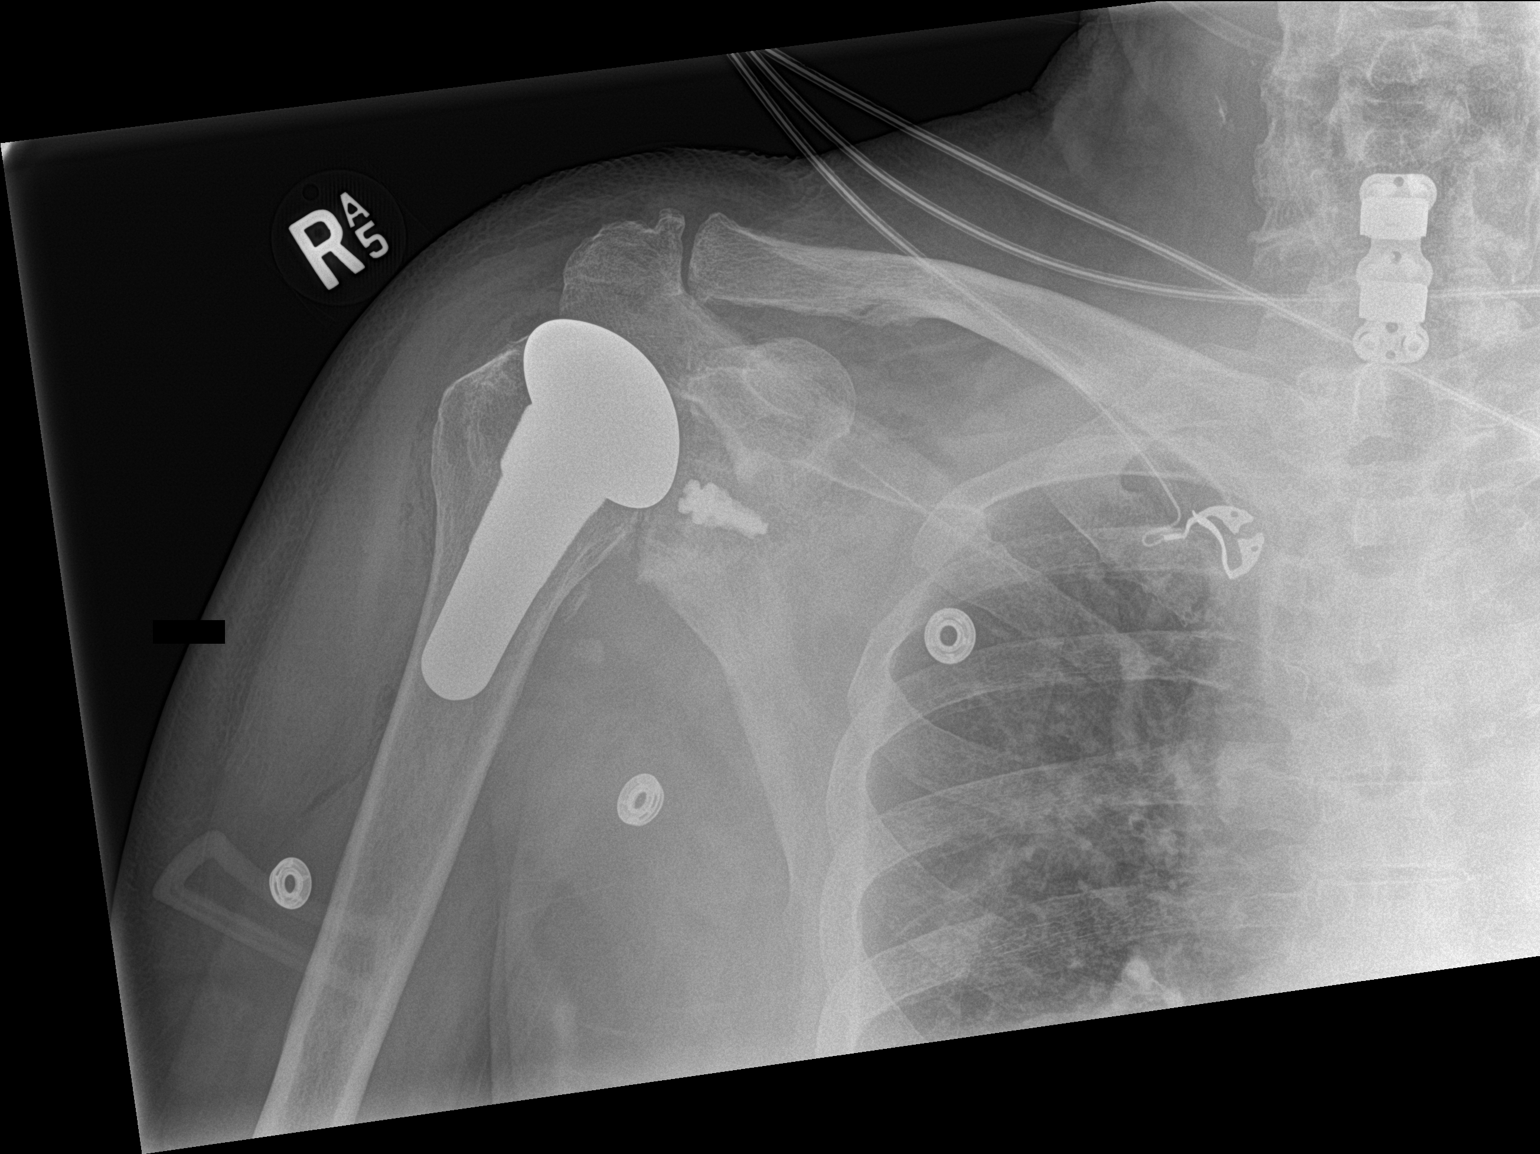
[im 2/2]
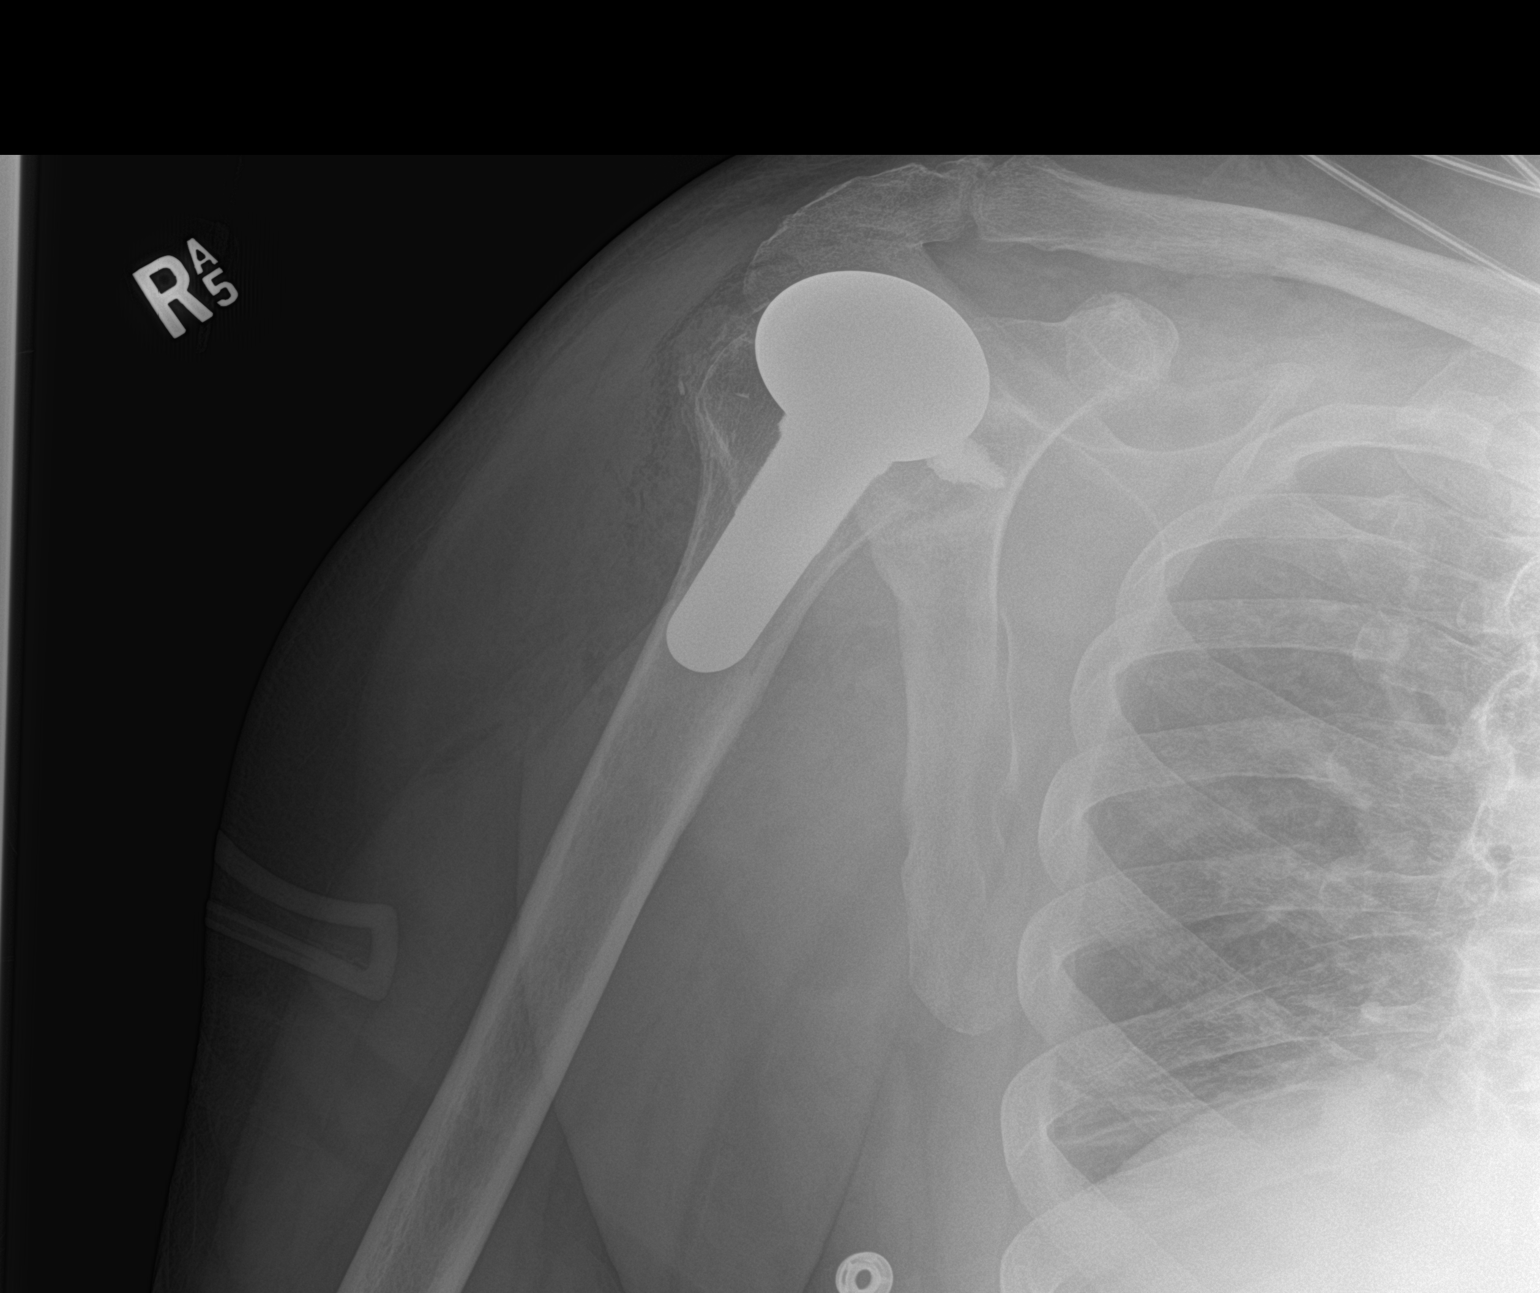

[2 of 2 positions shown; findings below may reference images not displayed]

FINDINGS: Postsurgical changes related to interval right total shoulder
arthroplasty. Components are well aligned. No fracture or
dislocation. Moderate acromioclavicular osteoarthritis. Expected
postsurgical subcutaneous emphysema in the surrounding soft tissues.
IMPRESSION: Interval right total shoulder arthroplasty without evidence of acute
postoperative complication.

## 2018-11-28 ENCOUNTER — Other Ambulatory Visit: Payer: Self-pay | Admitting: Cardiovascular Disease

## 2018-11-28 MED ORDER — ATORVASTATIN CALCIUM 40 MG PO TABS: 40.0000 mg | ORAL_TABLET | Freq: Every day | ORAL | 2 refills | Status: DC

## 2018-11-28 MED ORDER — ATORVASTATIN CALCIUM 40 MG PO TABS: 40.0000 mg | ORAL_TABLET | Freq: Every day | ORAL | 0 refills | Status: DC

## 2018-11-28 MED FILL — ATORVASTATIN 40 MG TABLET: 40 | 10 days supply | Qty: 10 | Fill #0

## 2018-11-28 NOTE — Telephone Encounter (Signed)
Pt's medication was sent to pt's pharmacy as requested. A 10 day supply to local pharmacy med center and 90 day supply with [facility] refills [facility]. Confirmation received.

## 2018-12-02 ENCOUNTER — Encounter: Payer: Self-pay | Admitting: Neurology

## 2018-12-04 ENCOUNTER — Ambulatory Visit (INDEPENDENT_AMBULATORY_CARE_PROVIDER_SITE_OTHER): Payer: Medicare Other | Admitting: Neurology

## 2018-12-04 ENCOUNTER — Encounter: Payer: Self-pay | Admitting: Neurology

## 2018-12-04 VITALS — BP 140/86 | HR 53 | Ht 73.0 in | Wt 230.0 lb

## 2018-12-04 DIAGNOSIS — R0981 Nasal congestion: Secondary | ICD-10-CM

## 2018-12-04 DIAGNOSIS — G4733 Obstructive sleep apnea (adult) (pediatric): Secondary | ICD-10-CM | POA: Diagnosis not present

## 2018-12-04 DIAGNOSIS — Z9989 Dependence on other enabling machines and devices: Secondary | ICD-10-CM

## 2018-12-04 NOTE — Patient Instructions (Signed)
Please look into using a salt water nasal rinse system, such as the [employer] and follow the directions, you can use daily in AM. It may help reduce your nasal congestion.   You can certainly look into getting a So Clean CPAP cleaning machine.   Please continue using your CPAP regularly. While your insurance requires that you use CPAP at least 4 hours each night on 70% of the nights, I recommend, that you not skip any nights and use it throughout the night if you can. Getting used to CPAP and staying with the treatment long term does take time and patience and discipline. Untreated obstructive sleep apnea when it is moderate to severe can have an adverse impact on cardiovascular health and raise her risk for heart disease, arrhythmias, hypertension, congestive heart failure, stroke and diabetes. Untreated obstructive sleep apnea causes sleep disruption, nonrestorative sleep, and sleep deprivation. This can have an impact on your day to day functioning and cause daytime sleepiness and impairment of cognitive function, memory loss, mood disturbance, and problems focussing. Using CPAP regularly can improve these symptoms.  I will see you in one year.

## 2018-12-04 NOTE — Progress Notes (Signed)
Subjective:    Patient ID: Jose [name]. is a 83 y.o. male.  HPI     Interim history:   Jose [name] is an 83 year old right-handed gentleman with an underlying medical history of new onset A. fib, obesity, reflux disease, recurrent TIA, hypertension, MGUS, ulnar neuropathy and arthritis, who presents for follow-up consultation of his OSA, treated with CPAP therapy. The patient is unaccompanied and presents for his yearly checkup. I last saw him on 12/04/2017, at which time he was fully compliant with his CPAP. Of note, he had interim left shoulder replacement surgery on 12/25/2016, then had lower back surgery in September 2018, then had right shoulder replacement in January 2019.  Today, 12/04/2018: I reviewed his CPAP compliance data from 11/03/2018 through 12/02/2018 which is a total of 30 days, during which time he used his CPAP every night with percent used days greater than 4 hours at 100%, indicating superb compliance with an average usage of 7 hours and 26 minutes, residual AHI at goal at 0.3 per hour, leak on the high side with the 95th percentile at 29.8 L/m on a pressure of 12 cm with EPR of 3. He reports doing well. He had a left heart cath in June 2019, has been started on Lipitor. Of note, he had left total hip replacement surgery on 07/08/2018. Did well. He has noticed nasal congestion during the day, no discharge, no history of rhinitis, no allergies, no postnasal drip and by the time he lays down to sleep his nasal congestion actually improves. He is looking into purchasing a cleaning machine for his CPAP. He stays fairly up-to-date with his supplies. He has been using nasal pillows but has not been tightening them as much because he noticed nasal soreness when he would tighten the head clear to much. His leak has therefore increased just a little bit compared to last year around this time.   The patient's allergies, current medications, family history, past medical history, past  social history, past surgical history and problem list were reviewed and updated as appropriate.    Previously (copied from previous notes for reference):      I saw him on 12/04/2016, at which time he reported doing well with CPAP. He was supposed to have left total shoulder replacement in February 2018.   I reviewed his CPAP compliance data from 11/03/2017 through 12/02/2017 which is a total of 30 days, during which time he used his CPAP every night with percent used days greater than 4 hours at 100%, indicating superb compliance with an average usage of 7 hours and 28 minutes, residual AHI at goal at 0.3 per hour, leak on the higher end with the 95th percentile at 17.8 L/m on a pressure of 12 cm with EPR of 3.  I saw him on 12/05/2015, at which time we talked about his split-night sleep study results from 03/01/2015 and his compliance with CPAP. He was fully compliant with CPAP therapy. I suggested with a checkup. He saw Dr. Jannifer [name] in the interim in March 2017. He had been treated in January 2017 for a community-acquired pneumonia. He then presented to the emergency room in January 2017 a balance issues and was diagnosed with vertigo, placed on meclizine as needed.    I reviewed his CPAP compliance data from 11/03/2016 through 12/02/2016, which is a total of 30 days, during which time he used his CPAP every night with percent used days greater than 4 hours at 100%, indicating superb compliance with an  average usage of 7 hours and 33 minutes, residual AHI 0.3 per hour, leak low with the 95th percentile at 10.2 L/m on a pressure of 12 cm with EPR of 3.    I first met him on 06/01/2015 at the request of Dr. Jannifer [name], at which time we talked about his split-night sleep study results from 03/01/2015 as well as his compliance data. He reported doing well. He had adjusted well to treatment. He felt improved with respect to sleep disruption, daytime somnolence, and sleep quality. He had recent ulnar nerve  surgery about 3 weeks prior. He was taking gabapentin for neuropathy. He was seeing a cardiologist out of [place] and was on Eliquis. He goes to bed around 11 PM and his rise time is around 7 AM. He wakes up better rested. His neuropathy seems stable to him. In the interim, he had neck surgery on 02/04/2015 under Dr. Hal [name]. He feels improved. He has a follow-up with his hand surgeon next week. He has not had any recent TIA type symptoms. He drinks alcohol occasionally. He quit smoking in the late 60s. He drinks quite a bit of coffee, at least 2 12 ounce cups a day.   I reviewed his CPAP compliance data from 11/01/2015 through 11/30/2015 which is a total of 30 days during which time he used his machine every night with percent used days greater than 4 hours at 100%, indicating superb compliance with an average usage of 7 hours and 29 minutes, residual AHI low at 0.4 per hour, leak borderline with the 95th percentile at 24.3 L/m on a pressure of 12 cm with EPR of 3.   06/01/2015: He was referred for a sleep study secondary to a report of witnessed apneas during his hospitalization in 1/16, and snoring reported. He had a split-night sleep study on 03/01/2015 and went over his test results with him in detail today. His baseline sleep efficiency was reduced at 77% with a latency to sleep of 5.5 minutes and wake after sleep onset of 29.5 minutes with moderate sleep fragmentation noted. He had a markedly elevated arousal index at 73.3 per hour, secondary to respiratory events. He had absence of slow-wave sleep and REM sleep prior to CPAP initiation. He had no significant PLMS, EKG or EEG changes. He had moderate snoring. He had a total AHI highly elevated at 71.3 per hour, average oxygen saturation was 93%, nadir was 83% during non-REM sleep. He was then placed on CPAP therapy. Sleep efficiency was 87.5% with a latency to sleep of 4 minutes and wake after sleep onset of 27 minutes with mild to moderate sleep  fragmentation noted. He had a tremendous improvement in his arousal index to 3.6 per hour. He had 12.2% of slow-wave sleep and achieved 28.9% of REM sleep. Average oxygen saturation was 93%, nadir was 81%. He had no significant PLMS during the treatment portion of the study. CPAP was titrated from 5 cm to 13 cm with a reduction of his AHI to 2.8 per hour at a pressure of 12 cm. Based on his test results I prescribed CPAP therapy for home use.    I reviewed his CPAP compliance data from 05/01/2015 through 05/30/2015 which is a total of 30 days during which time he used his machine every night with percent used days greater than 4 hours at 93%, indicating excellent compliance with an average usage of 6 hours and 32 minutes, residual AHI at 3.4 per hour, leak acceptable with the 95th percentile at  20.2 L/m on a pressure of 12 cm with EPR of 3.  His Past Medical History Is Significant For: Past Medical History:  Diagnosis Date  . Arthritis   . CAD (coronary artery disease)    04/15/18 65% dLAD, 20% pLAD, 30% 2nd diag, Normal EF  . Dysrhythmia    afib  . Gallstones 07/1996  . Headache    couple times a year  . History of CT scan of head 1997   small vessel disease  . History of ETT 05/2002    no EKG changes  . History of MRI of cervical spine 07/15/2002  . Hypertension   . Lumbar spondylolysis 12/17/2016  . MGUS (monoclonal gammopathy of unknown significance) 04/11/2012  . Peripheral neuropathy   . Pneumonia 10/2014  . PONV (postoperative nausea and vomiting)    once many years ago  . Primary localized osteoarthritis of left hip 07/08/2018  . Primary localized osteoarthrosis of left shoulder 12/25/2016  . Primary localized osteoarthrosis of right shoulder 11/05/2017  . Rectal bleeding 12/2002   colonoscopy  . Seizures ([place])    last seizure 1976  . Skin cancer   . Sleep apnea    uses CPAP  . Spinal stenosis, lumbar region with neurogenic claudication   . Stroke [facility])    ?tia ?afib  . TIA  (transient ischemic attack)   . 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 extremity 03/29/2015    His Past Surgical History Is Significant For: Past Surgical History:  Procedure Laterality Date  . ANTERIOR CERVICAL DECOMP/DISCECTOMY FUSION N/A 02/04/2015   Procedure: ANTERIOR CERVICAL DECOMPRESSION/DISCECTOMY FUSION CERVICAL FIVE-SIX,CERVICAL SIX-SEVEN;  Surgeon: Karie Chimera, MD;  Location: [place] [facility];  Service: Neurosurgery;  Laterality: N/A;  . BACK SURGERY    . CATARACT EXTRACTION Bilateral   . COLONOSCOPY W/ POLYPECTOMY  04/19/2003   tubular adenoma  . EYE SURGERY     Bilateral Cataracts  . GREAT TOE ARTHRODESIS, INTERPHALANGEAL JOINT Left 1978  . JOINT REPLACEMENT    . joint replacement on right thumb Right   . LEFT HEART CATH AND CORONARY ANGIOGRAPHY N/A 04/15/2018   Procedure: LEFT HEART CATH AND CORONARY ANGIOGRAPHY;  Surgeon: Troy Sine, MD;  Location: [place] [facility];  Service: Cardiovascular;  Laterality: N/A;  . LUMBAR LAMINECTOMY/DECOMPRESSION MICRODISCECTOMY N/A 07/19/2017   Procedure: LAMINECTOMY AND FORAMINOTOMY  LUMBAR TWO- LUMBAR THREE, LUMBAR THREE- LUMBAR FOUR;  Surgeon: Ashok Pall, MD;  Location: [place];  Service: Neurosurgery;  Laterality: N/A;  . nerve transfer surgery to lwft hand Left    Nov. 2017  . SHOULDER ARTHROSCOPY W/ ROTATOR CUFF REPAIR Left 15   rotator cuff   . SHOULDER INJECTION  June 2012   left, [place]  . SKIN CANCER EXCISION  07/1993   left lateral arm  . SUPERFICIAL KERATECTOMY Right 05/1998   wrist  . thumb injection  June 2012   left, [place]  . TONSILLECTOMY AND ADENOIDECTOMY    . TOTAL HIP ARTHROPLASTY Left 07/08/2018  . TOTAL HIP ARTHROPLASTY Left 07/08/2018   Procedure: LEFT TOTAL HIP ARTHROPLASTY;  Surgeon: Marchia Bond, MD;  Location: [place];  Service: Orthopedics;  Laterality: Left;  . TOTAL SHOULDER ARTHROPLASTY Left 12/25/2016   Procedure: TOTAL SHOULDER ARTHROPLASTY;  Surgeon:  Marchia Bond, MD;  Location: [place];  Service: Orthopedics;  Laterality: Left;  . TOTAL SHOULDER ARTHROPLASTY Right 11/05/2017  . TOTAL SHOULDER ARTHROPLASTY Right 11/05/2017   Procedure: TOTAL SHOULDER ARTHROPLASTY;  Surgeon: Marchia Bond, MD;  Location: [place];  Service: Orthopedics;  Laterality: Right;  . ulnar nerve transfer  12/1999   left  . ULNAR NERVE TRANSPOSITION Left     His Family History Is Significant For: Family History  Problem Relation Age of Onset  . Dementia Mother 5       vascular  . Transient ischemic attack Mother   . Dementia Father        after hip fracture  . Hypertension Sister        skin cancer  . Seizures Neg Hx     His Social History Is Significant For: Social History   Socioeconomic History  . Marital status: Married    Spouse name: [name]  . Number of children: 2  . Years of education: 16  . Highest education level: Not on file  Occupational History  . Occupation: retired    Fish farm manager: UNEMPLOYED  Social Needs  . Financial resource strain: Not on file  . Food insecurity:    Worry: Not on file    Inability: Not on file  . Transportation needs:    Medical: Not on file    Non-medical: Not on file  Tobacco Use  . Smoking status: Former Smoker    Packs/day: 2.00    Years: 13.00    Pack years: 26.00    Types: Cigarettes    Last attempt to quit: 03/05/1968    Years since quitting: 50.7  . Smokeless tobacco: Never Used  Substance and Sexual Activity  . Alcohol use: Yes    Alcohol/week: 2.0 standard drinks    Types: 2 Glasses of wine per week  . Drug use: No  . Sexual activity: Not Currently  Lifestyle  . Physical activity:    Days per week: Not on file    Minutes per session: Not on file  . Stress: Not on file  Relationships  . Social connections:    Talks on phone: Not on file    Gets together: Not on file    Attends religious service: Not on file    Active member of club or organization: Not on file    Attends  meetings of clubs or organizations: Not on file    Relationship status: Not on file  Other Topics Concern  . Not on file  Social History Narrative   Lives with 4th wife, [name], married 2010   Retired, organizational psychology   [employer], [place], [name], 2 children   Daughter, [place], in [place], 2 children   Patient is right handed   Patient drinks approximately 3-4 cups of caffeine daily    His Allergies Are:  Allergies  Allergen Reactions  . Maprotiline Other (See Comments)    TETRACYCLIC ANTIDEPRESSANTS UNSPECIFIED REACTION (patient is unaware of allergy) TETRACYCLIC ANTIDEPRESSANTS UNSPECIFIED REACTION (patient is unaware of allergy)  . Tetracycline Hcl Itching and Rash    Rash on hands  :   His Current Medications Are:  Outpatient Encounter Medications as of 12/04/2018  Medication Sig  . amLODipine (NORVASC) 5 MG tablet Take 5 mg by mouth daily.  Marland Kitchen aspirin EC 81 MG EC tablet Take 1 tablet (81 mg total) by mouth daily.  Marland Kitchen atorvastatin (LIPITOR) 40 MG tablet Take 1 tablet (40 mg total) by mouth daily.  Marland Kitchen ELIQUIS 5 MG TABS tablet TAKE 1 TABLET TWICE A DAY  . isosorbide mononitrate (IMDUR) 30 MG 24 hr tablet Take 1 tablet (30 mg total) by  mouth 2 (two) times daily.  Marland Kitchen labetalol (NORMODYNE) 200 MG tablet Take 100 mg by mouth 2 (two) times daily.  Marland Kitchen latanoprost (XALATAN) 0.005 % ophthalmic solution Place 1 drop into the right eye at bedtime.  Marland Kitchen losartan (COZAAR) 50 MG tablet Take 50 mg by mouth daily.  . meclizine (ANTIVERT) 25 MG tablet Take 1 tablet (25 mg total) by mouth 3 (three) times daily as needed.  . Multiple Vitamins-Minerals (PRESERVISION AREDS 2) CAPS Take 1 capsule by mouth 2 (two) times daily.   . nitroGLYCERIN (NITROSTAT) 0.4 MG SL tablet Place 1 tablet (0.4 mg total) under the tongue every 5 (five) minutes x 3 doses as needed for chest pain.  Vladimir Faster Glycol-Propyl Glycol (LUBRICANT EYE DROPS) 0.4-0.3 % SOLN Place 1 drop into the  right eye at bedtime.  Carren Rang BALANCE 0.6 % SOLN Place 1 drop into both eyes 4 (four) times daily.  Marland Kitchen HYDROcodone-acetaminophen (NORCO) 10-325 MG tablet Take 1 tablet by mouth every 6 (six) hours as needed. (Patient not taking: Reported on 12/04/2018)   No facility-administered encounter medications on file as of 12/04/2018.   :  Review of Systems:  Out of a complete 14 point review of systems, all are reviewed and negative with the exception of these symptoms as listed below:  Review of Systems  Neurological:       Pt presents today to discuss his cpap. Pt reports that he has questions about if a cpap cleaning machine is worth the money. Pt reports that he is chronically congested.    Objective:  Neurological Exam  Physical Exam Physical Examination:   Vitals:   12/04/18 1040  BP: 140/86  Pulse: (!) 53   General Examination: The patient is a very pleasant 83 y.o. male in no acute distress. He appears well-developed and well-nourished and well groomed.   HEENT:Normocephalic, atraumatic, pupils are equal, round and reactive to light and accommodation. Extraocular tracking is good without limitation to gaze excursion or nystagmus noted. Normal smooth pursuit is noted. Hearing is grossly intact. s/p cataract surgeries.Face is symmetric with normal facial animation and normal facial sensation. Speech is clear with no dysarthria noted. There is no hypophonia. There is no lip, neck/head, jaw or voice tremor. Neck shows FROM. Oropharynx exam reveals:mild mouth dryness, adequatedental hygiene and moderateairway crowding. Mallampati is class II. Tongue protrudes centrally and palate elevates symmetrically. Tonsils are absent.   Chest:Clear to auscultation without wheezing, rhonchi or crackles noted.  Heart:S1+S2+0, regular and normal without murmurs, rubs or gallops noted.   Abdomen:Soft, non-tender and non-distended.  Extremities:There istracepitting edema bilaterally.     Skin: Warm and dry without trophic changes noted.  Musculoskeletal: exam reveals good range of motion in the left shoulder, right arm in a sling.   Neurologically:  Mental status: The patient is awake, alert and oriented in all 4 spheres.Hisimmediate and remote memory, attention, language skills and fund of knowledge are appropriate. There is no evidence of aphasia, agnosia, apraxia or anomia. Speech is clear with normal prosody and enunciation. Thought process is linear. Mood is normaland affect is normal.  Cranial nerves II - XII are as described above under HEENT exam.  Motor exam: Normal bulk, strength and tone is noted. There is no tremor. Romberg is not testable safely.Fine motor skills and coordination: grossly intact. Cerebellar testing: No dysmetria or intention tremor, but heel to shin isdifficult on L. Sensory exam: intact to light touch in the upper and lower extremities.  Gait, station and balance:Hestands with  mild difficulty.Stance is wide based and posture is age-appropriate. Tandem walk isnot testable safely.  Assessmentand Plan:   In summary,Berish S Smelcer Jr.is a very pleasant 53 year oldmalewith an underlying medicalhistory ofA fib, GERD, TIAs, HTN, MGUS, PN, and s/p ulnar nerve surgery, History of arthritis with joint replacement surgeries and back surgery, most recently left hip replacement, who presents for follow-up consultation of his obstructive sleep apnea for which he has been on CPAP with full compliance and ongoing good results.  encouraged to drink more water, be mindful about his fall risk. He is commended for his CPAP treatment adherence. He is advised to follow-up routinely in one year. I answered all his questions today and the patient was in agreement. I spent 20 minutes in total face-to-face time with the patient, more than 50% of which was spent in counseling and coordination of care, reviewing test results, reviewing medication and  discussing or reviewing the diagnosis of OSA, its prognosis and treatment options. Pertinent laboratory and imaging test results that were available during this visit with the patient were reviewed by me and considered in my medical decision making (see chart for details).

## 2018-12-09 ENCOUNTER — Ambulatory Visit (INDEPENDENT_AMBULATORY_CARE_PROVIDER_SITE_OTHER): Payer: Medicare Other | Admitting: Neurology

## 2018-12-09 ENCOUNTER — Encounter: Payer: Self-pay | Admitting: Neurology

## 2018-12-09 VITALS — BP 118/70 | HR 54 | Ht 73.0 in | Wt 230.0 lb

## 2018-12-09 DIAGNOSIS — M4306 Spondylolysis, lumbar region: Secondary | ICD-10-CM | POA: Diagnosis not present

## 2018-12-09 DIAGNOSIS — M5481 Occipital neuralgia: Secondary | ICD-10-CM

## 2018-12-09 DIAGNOSIS — G609 Hereditary and idiopathic neuropathy, unspecified: Secondary | ICD-10-CM | POA: Diagnosis not present

## 2018-12-09 HISTORY — DX: Occipital neuralgia: M54.81

## 2018-12-09 NOTE — Progress Notes (Signed)
Reason for visit: Peripheral neuropathy  Jose [name]. is an 83 y.o. male  History of present illness:  Jose [name] is an 83 year old right-handed white male with a history of a peripheral neuropathy, he does have a history of borderline diabetes.  The patient has not had any significant discomfort in the feet and legs, but more recently he has had some occasional lancinating pains in the right foot only.  He indicates that this may occur relatively frequently but only lasts a few minutes and he does not believe that he needs medications for the discomfort.  He has more recently noted numbness in the left occipital nerve distribution, he has had increased stiffness and lack of mobility of the neck.  He denies any pain shooting down the arms, he has a chronic left ulnar neuropathy.  He has had bilateral shoulder joint replacements, he had a left total hip replacement recently.  He does have some mild gait instability, he has not had any falls, he does not use a cane.  He is able to sleep fairly well at night, he has sleep apnea on CPAP.  The patient returns for an evaluation.  Past Medical History:  Diagnosis Date  . Arthritis   . CAD (coronary artery disease)    04/15/18 65% dLAD, 20% pLAD, 30% 2nd diag, Normal EF  . Dysrhythmia    afib  . Gallstones 07/1996  . Headache    couple times a year  . History of CT scan of head 1997   small vessel disease  . History of ETT 05/2002    no EKG changes  . History of MRI of cervical spine 07/15/2002  . Hypertension   . Lumbar spondylolysis 12/17/2016  . MGUS (monoclonal gammopathy of unknown significance) 04/11/2012  . Peripheral neuropathy   . Pneumonia 10/2014  . PONV (postoperative nausea and vomiting)    once many years ago  . Primary localized osteoarthritis of left hip 07/08/2018  . Primary localized osteoarthrosis of left shoulder 12/25/2016  . Primary localized osteoarthrosis of right shoulder 11/05/2017  . Rectal bleeding 12/2002   colonoscopy  . Seizures ([place])    last seizure 1976  . Skin cancer   . Sleep apnea    uses CPAP  . Spinal stenosis, lumbar region with neurogenic claudication   . Stroke [facility])    ?tia ?afib  . TIA (transient ischemic attack)   . 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 ANTERIOR CERVICAL DECOMP/DISCECTOMY FUSION N/A 02/04/2015   Procedure: ANTERIOR CERVICAL DECOMPRESSION/DISCECTOMY FUSION CERVICAL FIVE-SIX,CERVICAL SIX-SEVEN;  Surgeon: Karie Chimera, MD;  Location: [place] [facility];  Service: Neurosurgery;  Laterality: N/A;  . BACK SURGERY    . CATARACT EXTRACTION Bilateral   . COLONOSCOPY W/ POLYPECTOMY  04/19/2003   tubular adenoma  . EYE SURGERY     Bilateral Cataracts  . GREAT TOE ARTHRODESIS, INTERPHALANGEAL JOINT Left 1978  . JOINT REPLACEMENT    . joint replacement on right thumb Right   . LEFT HEART CATH AND CORONARY ANGIOGRAPHY N/A 04/15/2018   Procedure: LEFT HEART CATH AND CORONARY ANGIOGRAPHY;  Surgeon: Troy Sine, MD;  Location: [place] CV LAB;  Service: Cardiovascular;  Laterality: N/A;  . LUMBAR LAMINECTOMY/DECOMPRESSION MICRODISCECTOMY N/A 07/19/2017   Procedure: LAMINECTOMY AND FORAMINOTOMY  LUMBAR TWO- LUMBAR THREE, LUMBAR THREE- LUMBAR FOUR;  Surgeon: Ashok Pall, MD;  Location: [place] [facility];  Service: Neurosurgery;  Laterality: N/A;  . nerve transfer surgery to lwft hand Left    Nov. 2017  . SHOULDER ARTHROSCOPY W/ ROTATOR CUFF REPAIR Left 13   rotator cuff   . SHOULDER INJECTION  June 2012   left, [place]  . SKIN CANCER EXCISION  07/1993   left lateral arm  . SUPERFICIAL KERATECTOMY Right 05/1998   wrist  . thumb injection  June 2012   left, [place]  . TONSILLECTOMY AND ADENOIDECTOMY    . TOTAL HIP ARTHROPLASTY Left 07/08/2018  . TOTAL HIP ARTHROPLASTY Left 07/08/2018   Procedure: LEFT TOTAL HIP ARTHROPLASTY;  Surgeon: Marchia Bond, MD;   Location: [place];  Service: Orthopedics;  Laterality: Left;  . TOTAL SHOULDER ARTHROPLASTY Left 12/25/2016   Procedure: TOTAL SHOULDER ARTHROPLASTY;  Surgeon: Marchia Bond, MD;  Location: [place];  Service: Orthopedics;  Laterality: Left;  . TOTAL SHOULDER ARTHROPLASTY Right 11/05/2017  . TOTAL SHOULDER ARTHROPLASTY Right 11/05/2017   Procedure: TOTAL SHOULDER ARTHROPLASTY;  Surgeon: Marchia Bond, MD;  Location: [place];  Service: Orthopedics;  Laterality: Right;  . ulnar nerve transfer  12/1999   left  . ULNAR NERVE TRANSPOSITION Left     Family History  Problem Relation Age of Onset  . Dementia Mother 22       vascular  . Transient ischemic attack Mother   . Dementia Father        after hip fracture  . Hypertension Sister        skin cancer  . Seizures Neg Hx     Social history:  reports that he quit smoking about 50 years ago. His smoking use included cigarettes. He has a 26.00 pack-year smoking history. He has never used smokeless tobacco. He reports current alcohol use of about 2.0 standard drinks of alcohol per week. He reports that he does not use drugs.    Allergies  Allergen Reactions  . Maprotiline Other (See Comments)    TETRACYCLIC ANTIDEPRESSANTS UNSPECIFIED REACTION (patient is unaware of allergy) TETRACYCLIC ANTIDEPRESSANTS UNSPECIFIED REACTION (patient is unaware of allergy)  . Tetracycline Hcl Itching and Rash    Rash on hands    Medications:  Prior to Admission medications   Medication Sig Start Date End Date Taking? Authorizing Provider  amLODipine (NORVASC) 5 MG tablet Take 5 mg by mouth daily.   Yes [provider]  aspirin EC 81 MG EC tablet Take 1 tablet (81 mg total) by mouth daily. 04/17/18  Yes Cheryln Manly, NP  atorvastatin (LIPITOR) 40 MG tablet Take 1 tablet (40 mg total) by mouth daily. 11/28/18  Yes Burnell Blanks, MD  ELIQUIS 5 MG TABS tablet TAKE 1 TABLET TWICE A DAY 04/21/18  Yes Kathrynn Ducking, MD    HYDROcodone-acetaminophen [facility]) 10-325 MG tablet Take 1 tablet by mouth every 6 (six) hours as needed. 07/08/18  Yes Marchia Bond, MD  isosorbide mononitrate (IMDUR) 30 MG 24 hr tablet Take 1 tablet (30 mg total) by mouth 2 (two) times daily. 05/13/18  Yes Burnell Blanks, MD  labetalol (NORMODYNE) 200 MG tablet Take 100 mg by mouth 2 (two) times daily.   Yes [provider]  latanoprost (XALATAN) 0.005 % ophthalmic solution Place 1 drop into the right eye at bedtime. 08/01/17  Yes [provider]  losartan (COZAAR) 50 MG tablet Take 50 mg by mouth daily. 03/30/18  Yes [provider]  meclizine (ANTIVERT) 25 MG tablet Take 1 tablet (25 mg total) by mouth  3 (three) times daily as needed. 02/22/16  Yes Tanna Furry, MD  Multiple Vitamins-Minerals (PRESERVISION AREDS 2) CAPS Take 1 capsule by mouth 2 (two) times daily.    Yes [provider]  nitroGLYCERIN (NITROSTAT) 0.4 MG SL tablet Place 1 tablet (0.4 mg total) under the tongue every 5 (five) minutes x 3 doses as needed for chest pain. 04/16/18  Yes Cheryln Manly, NP  Polyethyl Glycol-Propyl Glycol (LUBRICANT EYE DROPS) 0.4-0.3 % SOLN Place 1 drop into the right eye at bedtime.   Yes [provider]  SYSTANE BALANCE 0.6 % SOLN Place 1 drop into both eyes 4 (four) times daily.   Yes [provider]    ROS:  Out of a complete 14 system review of symptoms, the patient complains only of the following symptoms, and all other reviewed systems are negative.  Left occipital numbness Balance difficulty Neck pain  Blood pressure 118/70, pulse (!) 54, height 6\' 1"  (1.854 m), weight 230 lb (104.3 kg), SpO2 95 %.  Physical Exam  General: The patient is alert and cooperative at the time of the examination.  Skin: No significant peripheral edema is noted.   Neurologic Exam  Mental status: The patient is alert and oriented x 3 at the time of the examination. The patient has apparent  normal recent and remote memory, with an apparently normal attention span and concentration ability.   Cranial nerves: Facial symmetry is present. Speech is normal, no aphasia or dysarthria is noted. Extraocular movements are full. Visual fields are full.  Motor: The patient has good strength in all 4 extremities, with exception of mild bilateral foot drops and some weakness of intrinsic muscles of the left hand and some slight weakness of external rotation of the left arm.  Sensory examination: Soft touch sensation is symmetric on the face, arms, and legs.  Coordination: The patient has good finger-nose-finger and heel-to-shin bilaterally.  Gait and station: The patient has a slightly wide-based gait, the patient walk independently.  Tandem gait is unsteady.  Romberg is negative.  Reflexes: Deep tendon reflexes are symmetric, but are depressed.   Assessment/Plan:  1.  Peripheral neuropathy  2.  Borderline diabetes  3.  Left occipital neuropathy  4.  Cervical spondylosis  5.  Mild gait disorder  6.  Mild bilateral foot drops  7.  Left ulnar neuropathy  The patient does not wish to have medications for his peripheral neuropathy, if this changes over time he is to contact our office.  The patient has developed numbness in the left occipital nerve distribution, he has had decreased mobility of the cervical spine, he may be having increasing arthritis affecting the neck.  The patient will be seeking attention through Dr. Christella Noa in this regard.  He will follow-up here if needed, he will contact our office if he requires medications for discomfort.  I have recommended physical therapy for his neck, he wishes to see Dr. Christella Noa first.  Jill Alexanders MD 12/09/2018 9:58 AM  [facility] [address] [place], [place] [address]  Phone [phone] Fax [phone]

## 2019-01-05 ENCOUNTER — Other Ambulatory Visit: Payer: Self-pay

## 2019-01-05 MED ORDER — NITROGLYCERIN 0.4 MG SL SUBL: 0.4000 mg | SUBLINGUAL_TABLET | SUBLINGUAL | 3 refills | Status: DC | PRN

## 2019-01-05 MED FILL — NITROGLYCERIN 0.4 MG TAB SL: 0.4 | 25 days supply | Qty: 25 | Fill #0

## 2019-01-16 ENCOUNTER — Other Ambulatory Visit: Payer: Self-pay | Admitting: Neurology

## 2019-01-16 NOTE — Telephone Encounter (Signed)
It appears that pt was asked to follow up with our office PRN. Will send to Dr. Jannifer Franklin for consideration on refilling eliquis.

## 2019-02-12 ENCOUNTER — Ambulatory Visit: Payer: Medicare Other | Admitting: Pulmonary Disease

## 2019-02-24 ENCOUNTER — Telehealth: Payer: Self-pay | Admitting: Cardiovascular Disease

## 2019-02-24 NOTE — Telephone Encounter (Signed)
° ° °  VIDEO/Doximity visit on 02/27/2019     Consent sent via MyChart   02/24/2019

## 2019-02-27 ENCOUNTER — Other Ambulatory Visit: Payer: Self-pay

## 2019-02-27 ENCOUNTER — Encounter: Payer: Self-pay | Admitting: Cardiovascular Disease

## 2019-02-27 ENCOUNTER — Telehealth (INDEPENDENT_AMBULATORY_CARE_PROVIDER_SITE_OTHER): Payer: Medicare Other | Admitting: Cardiovascular Disease

## 2019-02-27 VITALS — BP 138/85 | HR 58 | Ht 73.0 in | Wt 227.0 lb

## 2019-02-27 DIAGNOSIS — I2510 Atherosclerotic heart disease of native coronary artery without angina pectoris: Secondary | ICD-10-CM | POA: Diagnosis not present

## 2019-02-27 DIAGNOSIS — I10 Essential (primary) hypertension: Secondary | ICD-10-CM | POA: Diagnosis not present

## 2019-02-27 DIAGNOSIS — E7800 Pure hypercholesterolemia, unspecified: Secondary | ICD-10-CM

## 2019-02-27 DIAGNOSIS — I480 Paroxysmal atrial fibrillation: Secondary | ICD-10-CM

## 2019-02-27 NOTE — Patient Instructions (Signed)
Medication Instructions:  Your physician recommends that you continue on your current medications as directed. Please refer to the Current Medication list given to you today.  If you need a refill on your cardiac medications before your next appointment, please call your pharmacy.   Lab work: None Ordered  If you have labs (blood work) drawn today and your tests are completely normal, you will receive your results only by: Marland Kitchen MyChart Message (if you have MyChart) OR . A paper copy in the mail If you have any lab test that is abnormal or we need to change your treatment, we will call you to review the results.  Testing/Procedures: None ordered  Follow-Up: At [facility], you and your health needs are our priority.  As part of our continuing mission to provide you with exceptional heart care, we have created designated Provider Care Teams.  These Care Teams include your primary Cardiologist (physician) and Advanced Practice Providers (APPs -  Physician Assistants and Nurse Practitioners) who all work together to provide you with the care you need, when you need it. . You will need a follow up appointment in 6 months.  Please call our office 2 months in advance to schedule this appointment.  You may see Darlina Guys, MD or one of the following Advanced Practice Providers on your designated Care Team:   . Lyda Jester, PA-C . Dayna Dunn, PA-C . Ermalinda Barrios, PA-C  Any Other Special Instructions Will Be Listed Below (If Applicable).

## 2019-02-27 NOTE — Progress Notes (Signed)
Virtual Visit via Video Note   This visit type was conducted due to national recommendations for restrictions regarding the COVID-19 Pandemic (e.g. social distancing) in an effort to limit this patient's exposure and mitigate transmission in our community.  Due to his co-morbid illnesses, this patient is at least at moderate risk for complications without adequate follow up.  This format is felt to be most appropriate for this patient at this time.  All issues noted in this document were discussed and addressed.  A limited physical exam was performed with this format.  Please refer to the patient's chart for his consent to telehealth for [facility].   Date:  02/27/2019   ID:  Jose Roussel., DOB 05/14/1936, MRN [id number]  Patient Location: Home Provider Location: Office  PCP:  Karlene Einstein, MD  Cardiologist:  Lauree Chandler, MD  Electrophysiologist:  None   Evaluation Performed:  Follow-Up Visit  Chief Complaint:  Follow up CAD  History of Present Illness:    Jose Fendley. is a 83 y.o. male with history of HTN, HLD, paroxysmal atrial fibrillation, TIA, MGUS and CAD who is being seen today by virtual e-visit due to the Covid19 pandemic. He had been followed by Dr. Atilano Median in [facility] until June 2019. He was admitted to [facility] in June 2019 with fluttering in his chest with associated chest pain. Troponin was negative. Nuclear stress test with possible ischemia. Cardiac cath June 2019 with mild to moderate non-obstructive disease in the LAD, mild disease in the RCA. Echo 04/15/18 with LVEF=60-65%. No significant valve disease. He was discharged on Imdur. He has done well since then.   He tells me today that he feels well overall. He continues to have continuous pain over his left chest described as a dull ache for the last year with no relief. Same pain that brought him into the hospital in June 2019. No  dyspnea. No LE edema.   The patient does not have symptoms  concerning for COVID-19 infection (fever, chills, cough, or new shortness of breath).    Past Medical History:  Diagnosis Date  . Arthritis   . CAD (coronary artery disease)    04/15/18 65% dLAD, 20% pLAD, 30% 2nd diag, Normal EF  . Dysrhythmia    afib  . Gallstones 07/1996  . Headache    couple times a year  . History of CT scan of head 1997   small vessel disease  . History of ETT 05/2002    no EKG changes  . History of MRI of cervical spine 07/15/2002  . Hypertension   . Lumbar spondylolysis 12/17/2016  . MGUS (monoclonal gammopathy of unknown significance) 04/11/2012  . Occipital neuralgia of left side 12/09/2018  . Peripheral neuropathy   . Pneumonia 10/2014  . PONV (postoperative nausea and vomiting)    once many years ago  . Primary localized osteoarthritis of left hip 07/08/2018  . Primary localized osteoarthrosis of left shoulder 12/25/2016  . Primary localized osteoarthrosis of right shoulder 11/05/2017  . Rectal bleeding 12/2002   colonoscopy  . Seizures ([place])    last seizure 1976  . Skin cancer   . Sleep apnea    uses CPAP  . Spinal stenosis, lumbar region with neurogenic claudication   . Stroke [facility])    ?tia ?afib  . TIA (transient ischemic attack)   . 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 extremity  03/29/2015   Past Surgical History:  Procedure Laterality Date  . ANTERIOR CERVICAL DECOMP/DISCECTOMY FUSION N/A 02/04/2015   Procedure: ANTERIOR CERVICAL DECOMPRESSION/DISCECTOMY FUSION CERVICAL FIVE-SIX,CERVICAL SIX-SEVEN;  Surgeon: Karie Chimera, MD;  Location: [place] [facility];  Service: Neurosurgery;  Laterality: N/A;  . BACK SURGERY    . CATARACT EXTRACTION Bilateral   . COLONOSCOPY W/ POLYPECTOMY  04/19/2003   tubular adenoma  . EYE SURGERY     Bilateral Cataracts  . GREAT TOE ARTHRODESIS, INTERPHALANGEAL JOINT Left 1978  . JOINT REPLACEMENT    . joint replacement on right thumb Right   . LEFT HEART CATH AND CORONARY  ANGIOGRAPHY N/A 04/15/2018   Procedure: LEFT HEART CATH AND CORONARY ANGIOGRAPHY;  Surgeon: Troy Sine, MD;  Location: [place] CV LAB;  Service: Cardiovascular;  Laterality: N/A;  . LUMBAR LAMINECTOMY/DECOMPRESSION MICRODISCECTOMY N/A 07/19/2017   Procedure: LAMINECTOMY AND FORAMINOTOMY  LUMBAR TWO- LUMBAR THREE, LUMBAR THREE- LUMBAR FOUR;  Surgeon: Ashok Pall, MD;  Location: [place];  Service: Neurosurgery;  Laterality: N/A;  . nerve transfer surgery to lwft hand Left    Nov. 2017  . SHOULDER ARTHROSCOPY W/ ROTATOR CUFF REPAIR Left 13   rotator cuff   . SHOULDER INJECTION  June 2012   left, [place]  . SKIN CANCER EXCISION  07/1993   left lateral arm  . SUPERFICIAL KERATECTOMY Right 05/1998   wrist  . thumb injection  June 2012   left, [place]  . TONSILLECTOMY AND ADENOIDECTOMY    . TOTAL HIP ARTHROPLASTY Left 07/08/2018  . TOTAL HIP ARTHROPLASTY Left 07/08/2018   Procedure: LEFT TOTAL HIP ARTHROPLASTY;  Surgeon: Marchia Bond, MD;  Location: [place];  Service: Orthopedics;  Laterality: Left;  . TOTAL SHOULDER ARTHROPLASTY Left 12/25/2016   Procedure: TOTAL SHOULDER ARTHROPLASTY;  Surgeon: Marchia Bond, MD;  Location: [place];  Service: Orthopedics;  Laterality: Left;  . TOTAL SHOULDER ARTHROPLASTY Right 11/05/2017  . TOTAL SHOULDER ARTHROPLASTY Right 11/05/2017   Procedure: TOTAL SHOULDER ARTHROPLASTY;  Surgeon: Marchia Bond, MD;  Location: [place];  Service: Orthopedics;  Laterality: Right;  . ulnar nerve transfer  12/1999   left  . ULNAR NERVE TRANSPOSITION Left      Current Meds  Medication Sig  . amLODipine (NORVASC) 5 MG tablet Take 5 mg by mouth daily.  Marland Kitchen aspirin EC 81 MG EC tablet Take 1 tablet (81 mg total) by mouth daily.  Marland Kitchen atorvastatin (LIPITOR) 40 MG tablet Take 1 tablet (40 mg total) by mouth daily.  Marland Kitchen ELIQUIS 5 MG TABS tablet TAKE 1 TABLET TWICE A DAY  . HYDROcodone-acetaminophen (NORCO) 10-325 MG tablet Take 1 tablet by mouth every 6 (six) hours as needed.  .  isosorbide mononitrate (IMDUR) 30 MG 24 hr tablet Take 1 tablet (30 mg total) by mouth 2 (two) times daily.  Marland Kitchen labetalol (NORMODYNE) 200 MG tablet Take 100 mg by mouth 2 (two) times daily.  Marland Kitchen latanoprost (XALATAN) 0.005 % ophthalmic solution Place 1 drop into the right eye at bedtime.  Marland Kitchen losartan (COZAAR) 50 MG tablet Take 50 mg by mouth daily.  . meclizine (ANTIVERT) 25 MG tablet Take 1 tablet (25 mg total) by mouth 3 (three) times daily as needed.  . Multiple Vitamins-Minerals (PRESERVISION AREDS 2) CAPS Take 1 capsule by mouth 2 (two) times daily.   . nitroGLYCERIN (NITROSTAT) 0.4 MG SL tablet Place 1 tablet (0.4 mg total) under the tongue every 5 (five) minutes x 3 doses as needed for chest pain.  Vladimir Faster Glycol-Propyl Glycol (LUBRICANT  EYE DROPS) 0.4-0.3 % SOLN Place 1 drop into the right eye at bedtime.  Carren Rang BALANCE 0.6 % SOLN Place 1 drop into both eyes 4 (four) times daily.     Allergies:   Maprotiline and Tetracycline hcl   Social History   Tobacco Use  . Smoking status: Former Smoker    Packs/day: 2.00    Years: 13.00    Pack years: 26.00    Types: Cigarettes    Last attempt to quit: 03/05/1968    Years since quitting: 51.0  . Smokeless tobacco: Never Used  Substance Use Topics  . Alcohol use: Yes    Alcohol/week: 2.0 standard drinks    Types: 2 Glasses of wine per week  . Drug use: No     Family Hx: The patient's family history includes Dementia in his father; Dementia (age of onset: 49) in his mother; Hypertension in his sister; Transient ischemic attack in his mother. There is no history of Seizures.  ROS:   Please see the history of present illness.    All other systems reviewed and are negative.   Prior CV studies:   The following studies were reviewed today:    Labs/Other Tests and Data Reviewed:    EKG:  No ECG reviewed.  Recent Labs: 07/09/2018: BUN 18; Creatinine, Ser 0.82; Hemoglobin 11.2; Platelets 163; Potassium 3.7; Sodium 140 08/04/2018:  ALT 24   Recent Lipid Panel Lab Results  Component Value Date/Time   CHOL 85 (L) 08/04/2018 08:38 AM   TRIG 59 08/04/2018 08:38 AM   HDL 41 08/04/2018 08:38 AM   CHOLHDL 2.1 08/04/2018 08:38 AM   CHOLHDL 3.8 04/15/2018 03:51 AM   LDLCALC 32 08/04/2018 08:38 AM   LDLDIRECT 120 (H) 08/03/2010 05:19 PM    Wt Readings from Last 3 Encounters:  02/27/19 227 lb (103 kg)  12/09/18 230 lb (104.3 kg)  12/04/18 230 lb (104.3 kg)     Objective:    Vital Signs:  BP 138/85   Pulse (!) 58   Ht 6\' 1"  (1.854 m)   Wt 227 lb (103 kg)   BMI 29.95 kg/m    VITAL SIGNS:  reviewed GEN:  no acute distress  ASSESSMENT & PLAN:    1. CAD with angina: No recent chest pain. Mild non-obstructive CAD by cath in June 2019. Continue ASA, statin, beta blocker, Norvasc and Imdur.    2. Hyperlipidemia: LDL at goal. Continue statin  3. HTN: BP is controlled. No changes  4. Atrial fibrillation, paroxysmal: No palpitations. Will continue beta blocker and Eliquis.    COVID-19 Education: The signs and symptoms of COVID-19 were discussed with the patient and how to seek care for testing (follow up with PCP or arrange E-visit).  The importance of social distancing was discussed today.  Time:   Today, I have spent 16 minutes with the patient with telehealth technology discussing the above problems.     Medication Adjustments/Labs and Tests Ordered: Current medicines are reviewed at length with the patient today.  Concerns regarding medicines are outlined above.   Tests Ordered: No orders of the defined types were placed in this encounter.   Medication Changes: No orders of the defined types were placed in this encounter.   Disposition:  Follow up in 6 month(s)  Signed, Lauree Chandler, MD  02/27/2019 10:17 AM    [facility]

## 2019-03-20 MED FILL — AMOXICILLIN 500 MG CAPSULE: 500 | 2 days supply | Qty: 8 | Fill #0

## 2019-04-16 IMAGING — DX DG CHEST 2V
2 series · 2 of 2 positions shown · non-contrast
Comparison: 11/07/2017, 11/01/2015 and 11/24/2014 chest x-ray.

CLINICAL DATA: 82-year-old male with rapid heart rate. History of
atrial fibrillation. Initial encounter.

EXAM:
CHEST - 2 VIEW

[x chest ap]
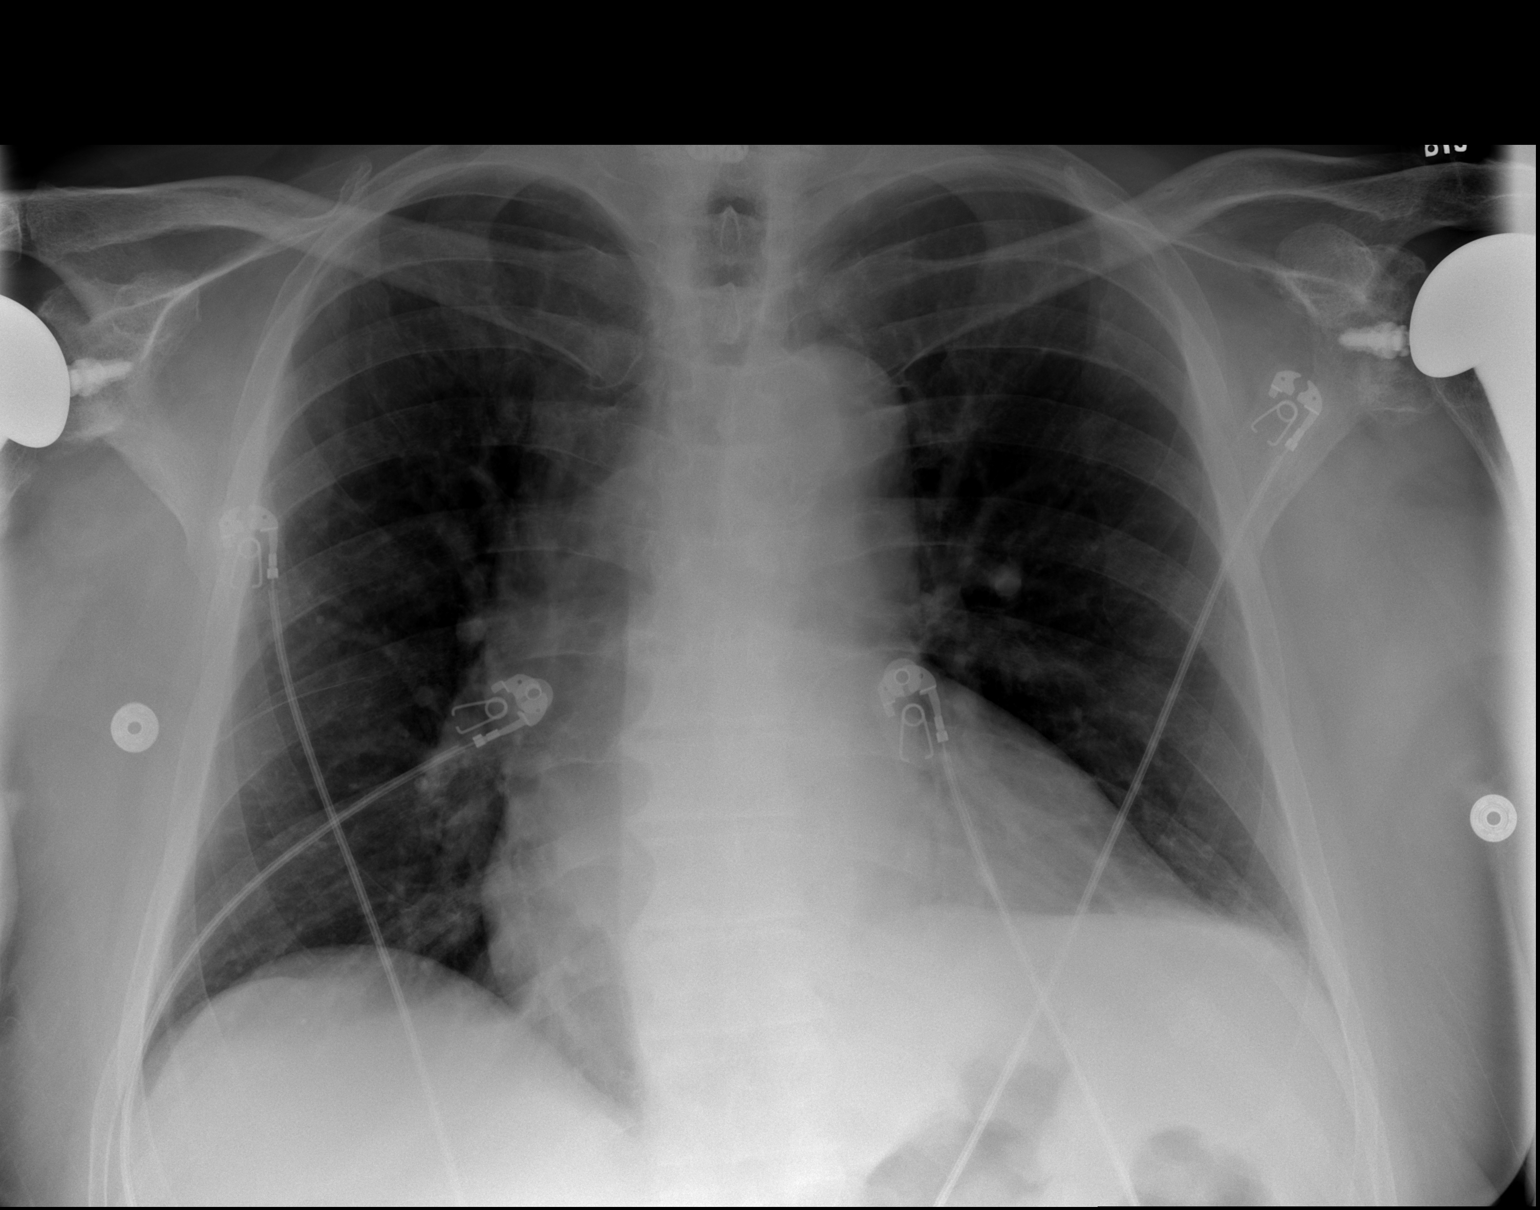

[w chest lat]
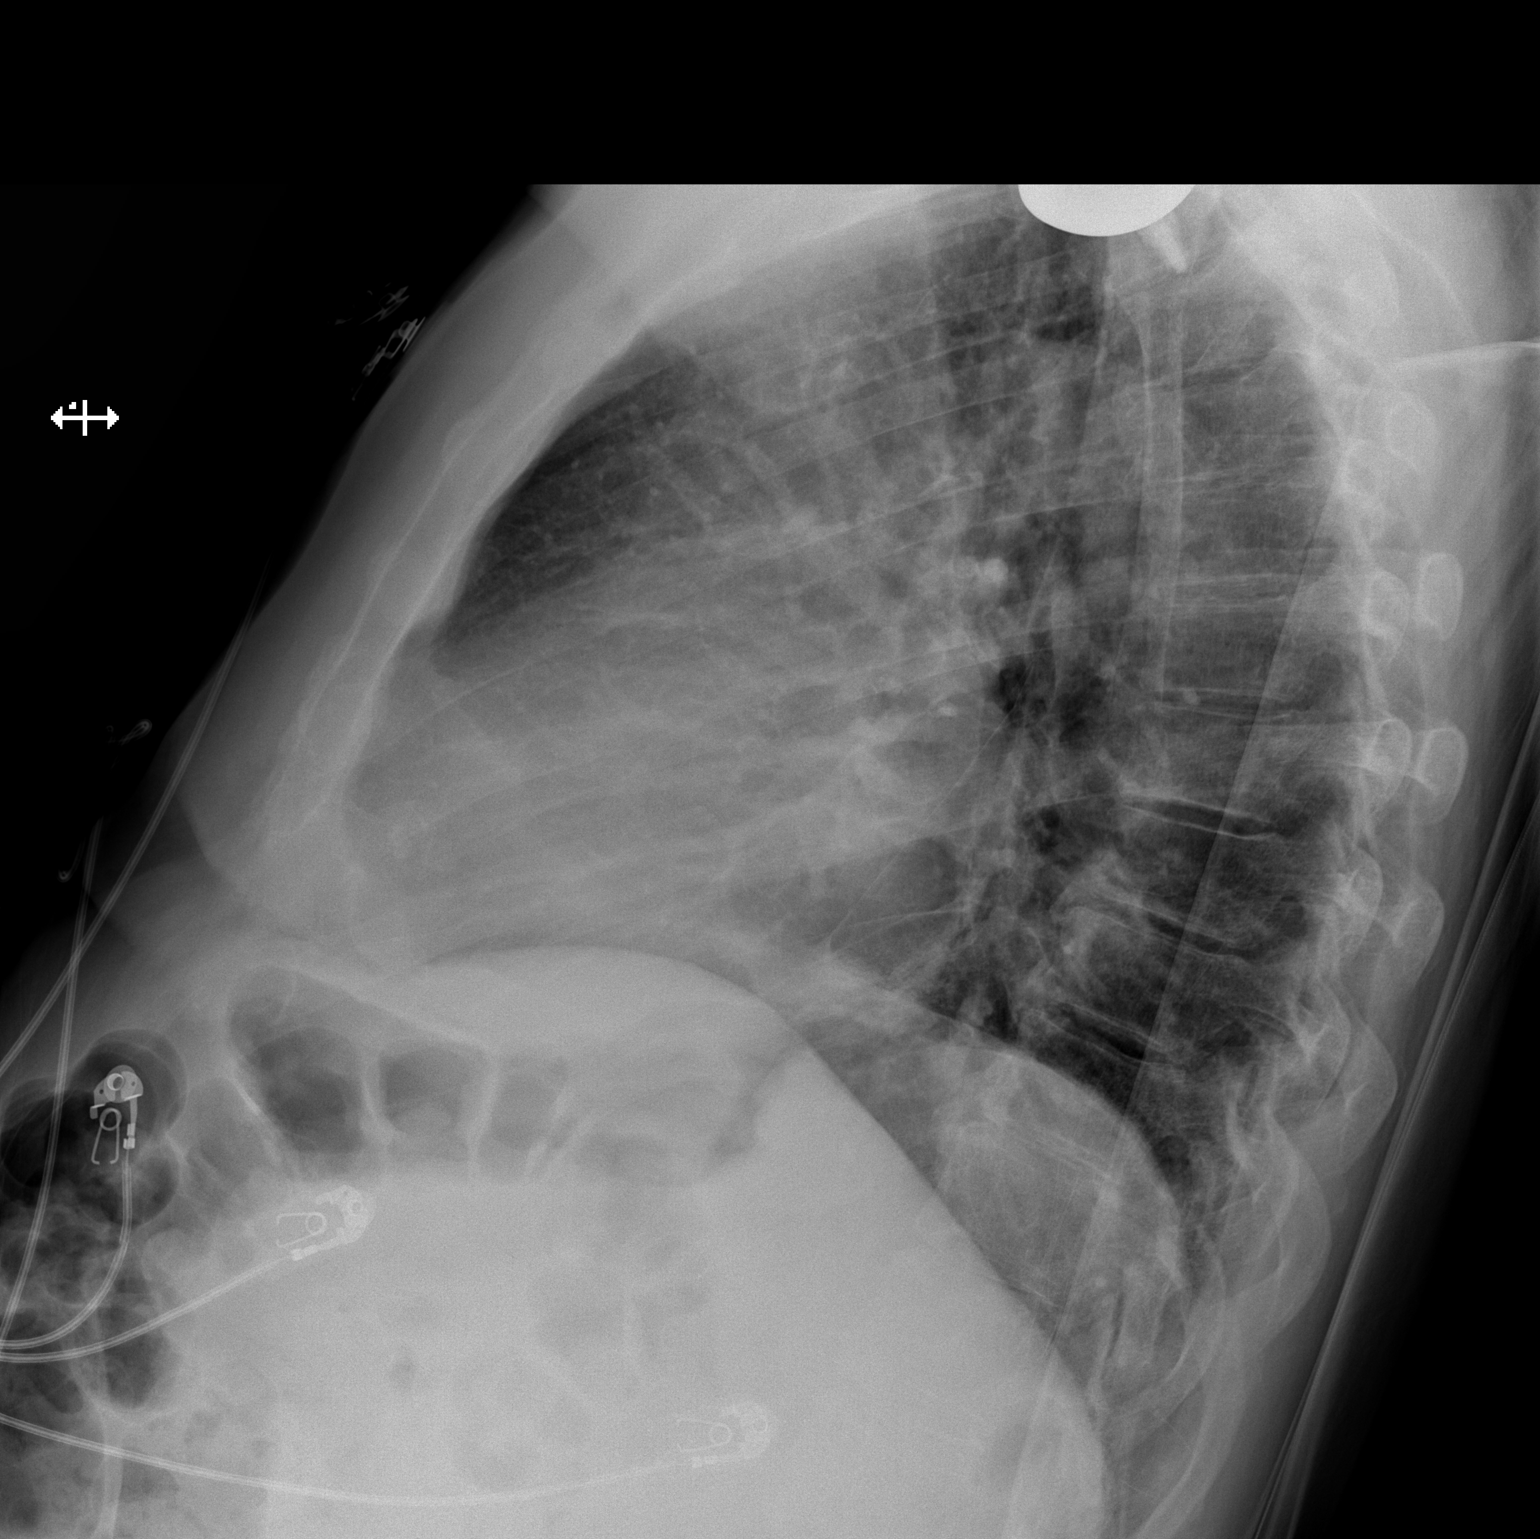

[2 of 2 positions shown; findings below may reference images not displayed]

FINDINGS: Slightly elevated left hemidiaphragm as noted on prior exams.

Cardiomegaly.

No infiltrate, congestive heart failure or pneumothorax.

Calcified minimally tortuous aorta.

Prior bilateral shoulder replacements and lower cervical spine
surgery.
IMPRESSION: Cardiomegaly.

No infiltrate or congestive heart failure.

Aortic Atherosclerosis ([id number]).

## 2019-05-08 MED FILL — POLYMYXIN B/TMP EYE DROPS: 10000-0.1 | 50 days supply | Qty: 10 | Fill #0

## 2019-05-31 ENCOUNTER — Other Ambulatory Visit: Payer: Self-pay | Admitting: Cardiovascular Disease

## 2019-06-09 ENCOUNTER — Other Ambulatory Visit: Payer: Self-pay | Admitting: Cardiovascular Disease

## 2019-06-09 MED ORDER — ISOSORBIDE MONONITRATE ER 30 MG PO TB24: ORAL_TABLET | ORAL | 0 refills | Status: DC

## 2019-06-09 NOTE — Telephone Encounter (Signed)
Pt's medication was sent to the pharmacy at the number [phone] as requested. Confirmation received.

## 2019-06-09 NOTE — Telephone Encounter (Signed)
New message    *STAT* If patient is at the pharmacy, call can be transferred to refill team.   1. Which medications need to be refilled? (please list name of each medication and dose if known) isosorbide mononitrate (IMDUR) 30 MG 24 hr tablet  2. Which pharmacy/location (including street and city if local pharmacy) is medication to be sent to?[facility]  [phone]  3. Do they need a 30 day or 90 day supply? [place]

## 2019-06-10 ENCOUNTER — Telehealth: Payer: Self-pay

## 2019-06-10 NOTE — Telephone Encounter (Signed)
We received a Isosorbide PA request from [place]. I called [facility] to get the pts INS info and was advised that this PA is not needed as the pt picked up his RX yesterday and paid $33.94 for it.

## 2019-07-02 ENCOUNTER — Other Ambulatory Visit: Payer: Self-pay | Admitting: Cardiovascular Disease

## 2019-07-02 MED ORDER — ISOSORBIDE MONONITRATE ER 30 MG PO TB24: ORAL_TABLET | ORAL | 0 refills | Status: DC

## 2019-07-02 MED FILL — ISOSORBIDE MN ER 30 MG TAB: 30 | 10 days supply | Qty: 20 | Fill #0

## 2019-07-02 NOTE — Telephone Encounter (Signed)
Pt's medication was sent to pt's pharmacy as requested. Confirmation received.  °

## 2019-07-10 IMAGING — DX DG HIP (WITH OR WITHOUT PELVIS) 1V PORT*L*
1 series · 1 of 1 positions shown · non-contrast
Comparison: None.

CLINICAL DATA: Left anterior hip discomfort.  Postoperative study

EXAM:
DG HIP (WITH OR WITHOUT PELVIS) 1V PORT LEFT

[pelvis ap]
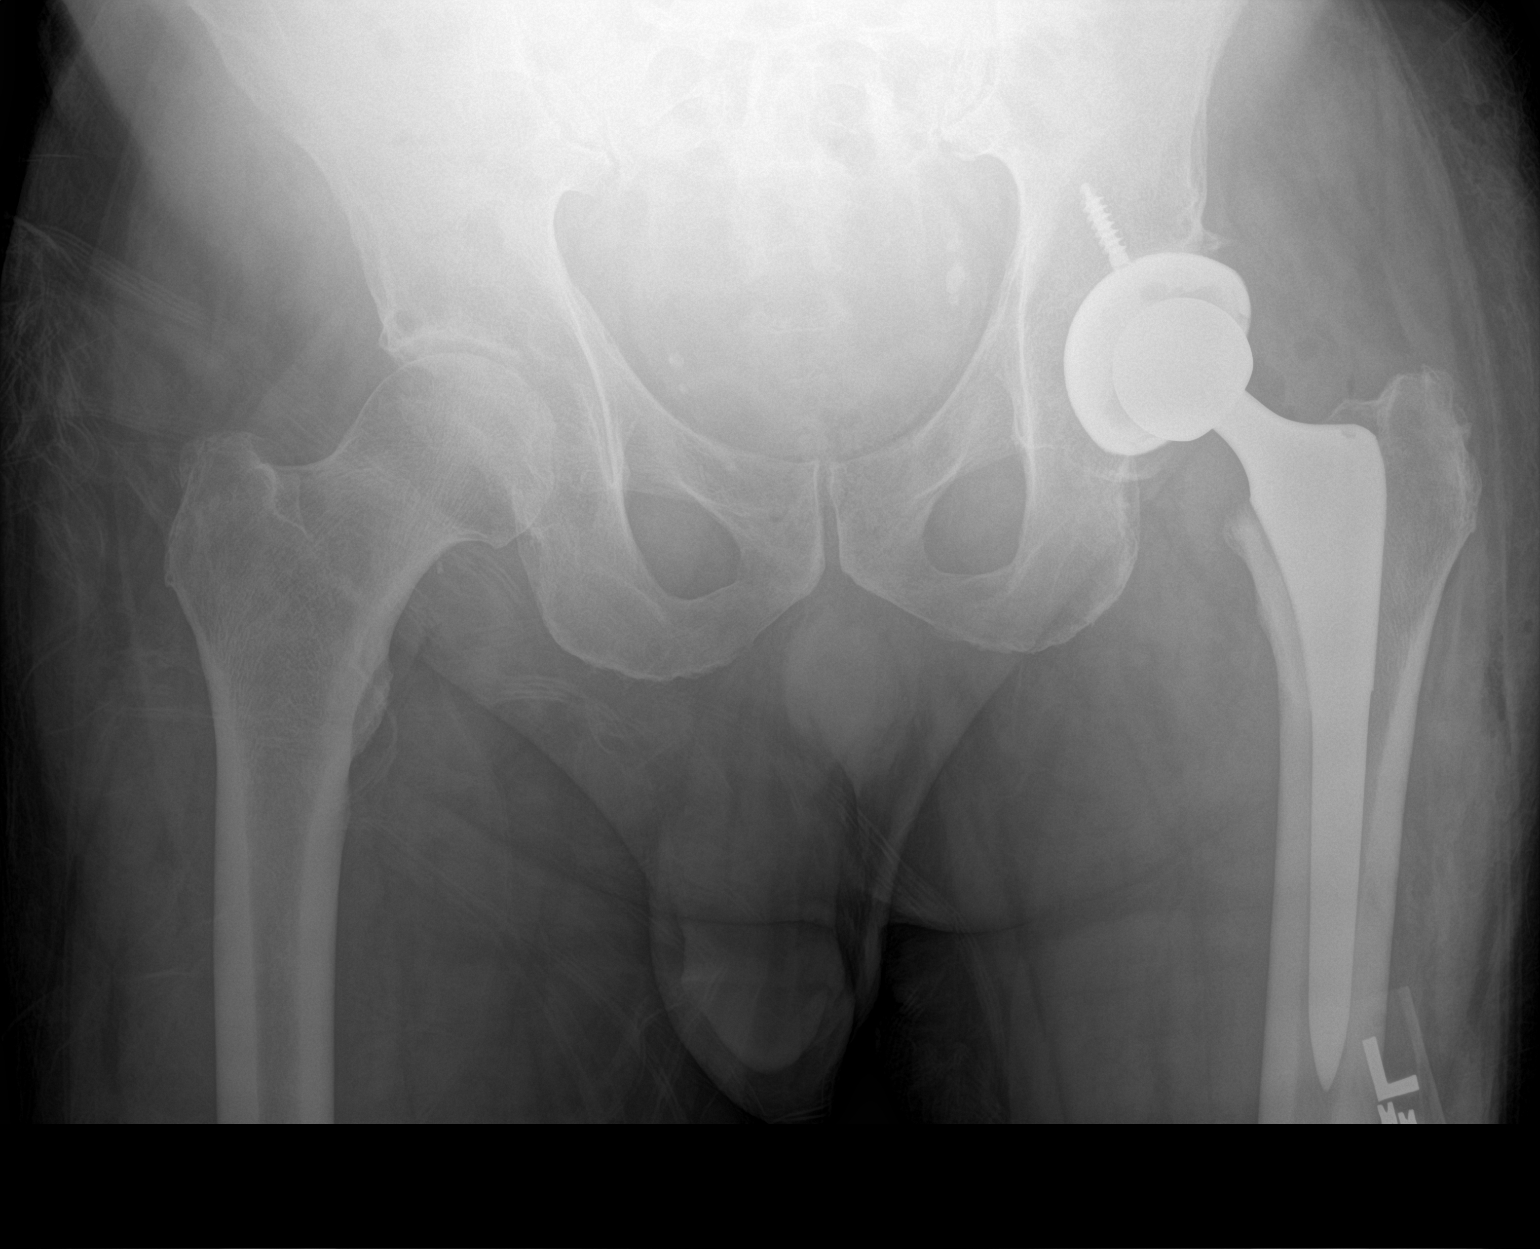

[1 of 1 positions shown; findings below may reference images not displayed]

FINDINGS: There is a prosthetic left hip joint in place. Radiographic
positioning of the prosthetic components is good. The interface with
the native bone appears normal. No acute native bone abnormality is
observed.
IMPRESSION: No postprocedure complication following left total hip joint
prosthesis placement.

## 2019-07-15 ENCOUNTER — Other Ambulatory Visit: Payer: Self-pay | Admitting: Neurology

## 2019-07-27 ENCOUNTER — Other Ambulatory Visit: Payer: Self-pay

## 2019-07-27 MED ORDER — APIXABAN 5 MG PO TABS: 5.0000 mg | ORAL_TABLET | Freq: Two times a day (BID) | ORAL | 1 refills | Status: DC

## 2019-07-27 MED FILL — ELIQUIS 5 MG TABLET: 5 | 30 days supply | Qty: 60 | Fill #0

## 2019-07-29 ENCOUNTER — Telehealth: Payer: Self-pay | Admitting: Neurology

## 2019-07-29 MED ORDER — APIXABAN 5 MG PO TABS: 5.0000 mg | ORAL_TABLET | Freq: Two times a day (BID) | ORAL | 1 refills | Status: DC

## 2019-07-29 NOTE — Telephone Encounter (Signed)
Pt is needing a new prescription for his apixaban (ELIQUIS) 5 MG TABS tablet sent to [facility] Please advise.

## 2019-07-29 NOTE — Telephone Encounter (Signed)
Chart reviewed and refilled to [facility].

## 2019-08-02 ENCOUNTER — Emergency Department (HOSPITAL_BASED_OUTPATIENT_CLINIC_OR_DEPARTMENT_OTHER)
Admission: EM | Admit: 2019-08-02 | Discharge: 2019-08-02 | Disposition: A | Discharge status: Home / Self Care | Payer: Medicare Other | Attending: Emergency Medicine | Admitting: Emergency Medicine

## 2019-08-02 ENCOUNTER — Encounter (HOSPITAL_BASED_OUTPATIENT_CLINIC_OR_DEPARTMENT_OTHER): Payer: Self-pay | Admitting: Emergency Medicine

## 2019-08-02 ENCOUNTER — Other Ambulatory Visit: Payer: Self-pay

## 2019-08-02 DIAGNOSIS — K648 Other hemorrhoids: Secondary | ICD-10-CM | POA: Insufficient documentation

## 2019-08-02 DIAGNOSIS — Z7901 Long term (current) use of anticoagulants: Secondary | ICD-10-CM | POA: Diagnosis not present

## 2019-08-02 DIAGNOSIS — Z87891 Personal history of nicotine dependence: Secondary | ICD-10-CM | POA: Diagnosis not present

## 2019-08-02 DIAGNOSIS — Z79899 Other long term (current) drug therapy: Secondary | ICD-10-CM | POA: Insufficient documentation

## 2019-08-02 DIAGNOSIS — Z8673 Personal history of transient ischemic attack (TIA), and cerebral infarction without residual deficits: Secondary | ICD-10-CM | POA: Diagnosis not present

## 2019-08-02 DIAGNOSIS — K625 Hemorrhage of anus and rectum: Secondary | ICD-10-CM | POA: Diagnosis present

## 2019-08-02 DIAGNOSIS — I2510 Atherosclerotic heart disease of native coronary artery without angina pectoris: Secondary | ICD-10-CM | POA: Diagnosis not present

## 2019-08-02 DIAGNOSIS — Z7982 Long term (current) use of aspirin: Secondary | ICD-10-CM | POA: Diagnosis not present

## 2019-08-02 DIAGNOSIS — I10 Essential (primary) hypertension: Secondary | ICD-10-CM | POA: Diagnosis not present

## 2019-08-02 LAB — CBC WITH DIFFERENTIAL/PLATELET
Abs Immature Granulocytes: 0.01 10*3/uL (ref 0.00–0.07)
Basophils Absolute: 0.1 10*3/uL (ref 0.0–0.1)
Basophils Relative: 1 %
Eosinophils Absolute: 0.1 10*3/uL (ref 0.0–0.5)
Eosinophils Relative: 2 %
HCT: 39.1 % (ref 39.0–52.0)
Hemoglobin: 12.8 g/dL — ABNORMAL LOW (ref 13.0–17.0)
Immature Granulocytes: 0 %
Lymphocytes Relative: 16 %
Lymphs Abs: 0.8 10*3/uL (ref 0.7–4.0)
MCH: 32.2 pg (ref 26.0–34.0)
MCHC: 32.7 g/dL (ref 30.0–36.0)
MCV: 98.5 fL (ref 80.0–100.0)
Monocytes Absolute: 0.4 10*3/uL (ref 0.1–1.0)
Monocytes Relative: 8 %
Neutro Abs: 3.6 10*3/uL (ref 1.7–7.7)
Neutrophils Relative %: 73 %
Platelets: 161 10*3/uL (ref 150–400)
RBC: 3.97 MIL/uL — ABNORMAL LOW (ref 4.22–5.81)
RDW: 13.2 % (ref 11.5–15.5)
WBC: 5 10*3/uL (ref 4.0–10.5)
nRBC: 0 % (ref 0.0–0.2)

## 2019-08-02 LAB — BASIC METABOLIC PANEL
Anion gap: 12 (ref 5–15)
BUN: 18 mg/dL (ref 8–23)
CO2: 24 mmol/L (ref 22–32)
Calcium: 9.1 mg/dL (ref 8.9–10.3)
Chloride: 103 mmol/L (ref 98–111)
Creatinine, Ser: 0.84 mg/dL (ref 0.61–1.24)
GFR calc Af Amer: >60 mL/min (ref >60)
GFR calc non Af Amer: >60 mL/min (ref >60)
Glucose, Bld: 146 mg/dL — ABNORMAL HIGH (ref 70–99)
Potassium: 3.5 mmol/L (ref 3.5–5.1)
Sodium: 139 mmol/L (ref 135–145)

## 2019-08-02 MED ORDER — HYDROCORTISONE (PERIANAL) 2.5 % EX CREA: 1.0000 "application " | TOPICAL_CREAM | Freq: Three times a day (TID) | CUTANEOUS | 1 refills | Status: DC

## 2019-08-02 NOTE — ED Triage Notes (Signed)
"   I am bleeding from my rear end this am and I can't get it to stop" Bleeding noted after having a soft BM, bright red

## 2019-08-02 NOTE — ED Provider Notes (Signed)
[facility] EMERGENCY DEPARTMENT Provider Note   CSN: [id number] Arrival date & time: 08/02/19  [id number]     History   Chief Complaint No chief complaint on file.   HPI Jose Summersett. is a 83 y.o. male.     The history is provided by the patient, medical records and the spouse. No language interpreter was used.   Jose Mallard. is a 83 y.o. male who presents to the Emergency Department complaining of rectal bleeding. He presents the emergency department complaining of rectal bleeding that began this morning. He states that he has a history of hemorrhoids and has intermittent bleeding at home. Usually episodes, ago and has an episode of bright red blood and then want have any four-month. This week he has had three episodes of bright red blood per rectum. The first two episodes earlier in the week spontaneously resolved after a few minutes after use the commode. Today he had a bowel movement around eight or 830 with a large amount of bright red blood in the commode. Since that time he has experienced persistent bleeding and has been needing to use tissues and pads to collect the blood. He has gone through 3 to 4 pads since this morning. He overall feels well. He denies any chest pain, shortness of breath, abdominal pain, rectal pain, fevers, numbness, weakness. He does take eliquis for history of a fib and TIA. Last dose was this morning. Past Medical History:  Diagnosis Date  . Arthritis   . CAD (coronary artery disease)    04/15/18 65% dLAD, 20% pLAD, 30% 2nd diag, Normal EF  . Dysrhythmia    afib  . Gallstones 07/1996  . Headache    couple times a year  . History of CT scan of head 1997   small vessel disease  . History of ETT 05/2002    no EKG changes  . History of MRI of cervical spine 07/15/2002  . Hypertension   . Lumbar spondylolysis 12/17/2016  . MGUS (monoclonal gammopathy of unknown significance) 04/11/2012  . Occipital neuralgia of left side 12/09/2018  .  Peripheral neuropathy   . Pneumonia 10/2014  . PONV (postoperative nausea and vomiting)    once many years ago  . Primary localized osteoarthritis of left hip 07/08/2018  . Primary localized osteoarthrosis of left shoulder 12/25/2016  . Primary localized osteoarthrosis of right shoulder 11/05/2017  . Rectal bleeding 12/2002   colonoscopy  . Seizures ([place])    last seizure 1976  . Skin cancer   . Sleep apnea    uses CPAP  . Spinal stenosis, lumbar region with neurogenic claudication   . Stroke [facility])    ?tia ?afib  . TIA (transient ischemic attack)   . 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 extremity 03/29/2015    Patient Active Problem List   Diagnosis Date Noted  . Occipital neuralgia of left side 12/09/2018  . Primary localized osteoarthritis of left hip 07/08/2018  . Primary localized osteoarthritis of hip 07/08/2018  . CAD (coronary artery disease) 04/16/2018  . Chest pain   . Abnormal cardiovascular stress test 04/14/2018  . Primary localized osteoarthrosis of right shoulder 11/05/2017  . Primary localized osteoarthrosis of shoulder 11/05/2017  . Lumbar stenosis with neurogenic claudication 07/19/2017  . Primary localized osteoarthrosis of left shoulder 12/25/2016  . S/P shoulder replacement 12/25/2016  . Lumbar spondylolysis 12/17/2016  . Paresthesia 01/24/2016  . Ulnar neuropathy  at elbow of left upper extremity 03/29/2015  . Cervical spinal stenosis 02/04/2015  . Bronchiectasis without acute exacerbation ([place]) 01/11/2015  . Ulnar neuropathy at elbow 12/24/2014  . TIA (transient ischemic attack) 12/15/2014  . Hyperlipidemia 11/24/2014  . Hypoxia   . New onset a-fib ([place])   . MGUS (monoclonal gammopathy of unknown significance) 04/11/2012  . SEBORRHEIC KERATOSIS 11/09/2010  . CPK, ABNORMAL 09/03/2010  . DERMATOPHYTOSIS OF FOOT 08/03/2010  . MYALGIA 12/27/2009  . GOUT, UNSPECIFIED 10/04/2009  . HYPERTROPHY PROSTATE W/O UR  OBST & OTH LUTS 12/14/2008  . ALLERGIC CONJUNCTIVITIS 12/03/2007  . TESTOSTERONE DEFICIENCY 11/14/2007  . Backache 04/22/2007  . HYPERLIPIDEMIA 12/26/2006  . OBESITY, NOS 12/26/2006  . 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 Hereditary and idiopathic peripheral neuropathy 12/26/2006  . HEARING LOSS NOS OR DEAFNESS 12/26/2006  . HYPERTENSION, BENIGN SYSTEMIC 12/26/2006  . OSTEOARTHRITIS, MULTI SITES 12/26/2006  . Musculoskeletal disorder and symptoms referable to neck 12/26/2006    Past Surgical History:  Procedure Laterality Date  . ANTERIOR CERVICAL DECOMP/DISCECTOMY FUSION N/A 02/04/2015   Procedure: ANTERIOR CERVICAL DECOMPRESSION/DISCECTOMY FUSION CERVICAL FIVE-SIX,CERVICAL SIX-SEVEN;  Surgeon: Karie Chimera, MD;  Location: [facility];  Service: Neurosurgery;  Laterality: N/A;  . BACK SURGERY    . CATARACT EXTRACTION Bilateral   . COLONOSCOPY W/ POLYPECTOMY  04/19/2003   tubular adenoma  . EYE SURGERY     Bilateral Cataracts  . GREAT TOE ARTHRODESIS, INTERPHALANGEAL JOINT Left 1978  . JOINT REPLACEMENT    . joint replacement on right thumb Right   . LEFT HEART CATH AND CORONARY ANGIOGRAPHY N/A 04/15/2018   Procedure: LEFT HEART CATH AND CORONARY ANGIOGRAPHY;  Surgeon: Troy Sine, MD;  Location: [facility];  Service: Cardiovascular;  Laterality: N/A;  . LUMBAR LAMINECTOMY/DECOMPRESSION MICRODISCECTOMY N/A 07/19/2017   Procedure: LAMINECTOMY AND FORAMINOTOMY  LUMBAR TWO- LUMBAR THREE, LUMBAR THREE- LUMBAR FOUR;  Surgeon: Ashok Pall, MD;  Location: [facility];  Service: Neurosurgery;  Laterality: N/A;  . nerve transfer surgery to lwft hand Left    Nov. 2017  . SHOULDER ARTHROSCOPY W/ ROTATOR CUFF REPAIR Left 15   rotator cuff   . SHOULDER INJECTION  June 2012   left, [place]  . SKIN CANCER EXCISION  07/1993   left lateral arm  . SUPERFICIAL KERATECTOMY Right 05/1998   wrist  . thumb injection  June 2012   left, [place]  . TONSILLECTOMY AND ADENOIDECTOMY    . TOTAL HIP  ARTHROPLASTY Left 07/08/2018  . TOTAL HIP ARTHROPLASTY Left 07/08/2018   Procedure: LEFT TOTAL HIP ARTHROPLASTY;  Surgeon: Marchia Bond, MD;  Location: [facility];  Service: Orthopedics;  Laterality: Left;  . TOTAL SHOULDER ARTHROPLASTY Left 12/25/2016   Procedure: TOTAL SHOULDER ARTHROPLASTY;  Surgeon: Marchia Bond, MD;  Location: [facility];  Service: Orthopedics;  Laterality: Left;  . TOTAL SHOULDER ARTHROPLASTY Right 11/05/2017  . TOTAL SHOULDER ARTHROPLASTY Right 11/05/2017   Procedure: TOTAL SHOULDER ARTHROPLASTY;  Surgeon: Marchia Bond, MD;  Location: [facility];  Service: Orthopedics;  Laterality: Right;  . ulnar nerve transfer  12/1999   left  . ULNAR NERVE TRANSPOSITION Left         Home Medications    Prior to Admission medications   Medication Sig Start Date End Date Taking? Authorizing Provider  amLODipine (NORVASC) 5 MG tablet TAKE 1 TABLET DAILY 06/08/19  Yes [provider]  apixaban (ELIQUIS) 5 MG TABS tablet Take 1 tablet (5 mg total) by mouth 2 (two) times daily. 07/29/19  Yes Margette Fast  K, MD  aspirin EC 81 MG EC tablet Take 1 tablet (81 mg total) by mouth daily. 04/17/18  Yes Cheryln Manly, NP  atorvastatin (LIPITOR) 40 MG tablet Take 1 tablet (40 mg total) by mouth daily. 11/28/18  Yes Burnell Blanks, MD  isosorbide mononitrate (IMDUR) 30 MG 24 hr tablet TAKE 1 TABLET TWICE A DAY (DOSE INCREASE) 07/02/19  Yes Burnell Blanks, MD  labetalol (NORMODYNE) 200 MG tablet Take 100 mg by mouth 2 (two) times daily.   Yes [provider]  latanoprost (XALATAN) 0.005 % ophthalmic solution Place 1 drop into the right eye at bedtime. 08/01/17  Yes [provider]  losartan (COZAAR) 50 MG tablet Take 50 mg by mouth daily. 03/30/18  Yes [provider]  meclizine (ANTIVERT) 25 MG tablet Take 1 tablet (25 mg total) by mouth 3 (three) times daily as needed. 02/22/16  Yes Tanna Furry, MD  nitroGLYCERIN (NITROSTAT) 0.4 MG SL tablet Place 1 tablet  (0.4 mg total) under the tongue every 5 (five) minutes x 3 doses as needed for chest pain. 01/05/19  Yes Burnell Blanks, MD  Polyethyl Glycol-Propyl Glycol (LUBRICANT EYE DROPS) 0.4-0.3 % SOLN Place 1 drop into the right eye at bedtime.   Yes [provider]  ROCKLATAN 0.02-0.005 % SOLN INSTILL 1 DROP INTO THE RIGHT EYE QHS 04/10/19  Yes [provider]  amLODipine (NORVASC) 5 MG tablet Take 5 mg by mouth daily.    [provider]  HYDROcodone-acetaminophen (NORCO) 10-325 MG tablet Take 1 tablet by mouth every 6 (six) hours as needed. 07/08/18   Marchia Bond, MD  hydrocortisone (ANUSOL-HC) 2.5 % rectal cream Place 1 application rectally 3 (three) times daily. 08/02/19   Quintella Reichert, MD  Multiple Vitamins-Minerals (PRESERVISION AREDS 2) CAPS Take 1 capsule by mouth 2 (two) times daily.     [provider]  SYSTANE BALANCE 0.6 % SOLN Place 1 drop into both eyes 4 (four) times daily.    [provider]    Family History Family History  Problem Relation Age of Onset  . Dementia Mother 51       vascular  . Transient ischemic attack Mother   . Dementia Father        after hip fracture  . Hypertension Sister        skin cancer  . Seizures Neg Hx     Social History Social History   Tobacco Use  . Smoking status: Former Smoker    Packs/day: 2.00    Years: 13.00    Pack years: 26.00    Types: Cigarettes    Quit date: 03/05/1968    Years since quitting: 51.4  . Smokeless tobacco: Never Used  Substance Use Topics  . Alcohol use: Yes    Comment: social  . Drug use: No     Allergies   Maprotiline and Tetracycline hcl   Review of Systems Review of Systems  All other systems reviewed and are negative.    Physical Exam Updated Vital Signs BP 117/63   Pulse (!) 46   Temp 98.7 F (37.1 C) (Oral)   Resp 16   Ht 6' (1.829 m)   Wt 103 kg   SpO2 97%   BMI 30.80 kg/m   Physical Exam Vitals signs and nursing note reviewed.   Constitutional:      Appearance: He is well-developed.  HENT:     Head: Normocephalic and atraumatic.  Cardiovascular:     Rate and  Rhythm: Normal rate and regular rhythm.  Pulmonary:     Effort: Pulmonary effort is normal. No respiratory distress.  Abdominal:     Palpations: Abdomen is soft.     Tenderness: There is no abdominal tenderness. There is no guarding or rebound.  Genitourinary:    Comments: circumferential hemorrhoids that are soft.  No active bleeding.  There is one area of mucosal friability, approx 1 cm in length at the 9 o'clock position.  Hemorrhoids are easily reduced with gentle pressure.  No gross blood on DRE Musculoskeletal:        General: No swelling or tenderness.  Skin:    General: Skin is warm and dry.  Neurological:     Mental Status: He is alert and oriented to person, place, and time.  Psychiatric:        Behavior: Behavior normal.      ED Treatments / Results  Labs (all labs ordered are listed, but only abnormal results are displayed) Labs Reviewed  BASIC METABOLIC PANEL - Abnormal; Notable for the following components:      Result Value   Glucose, Bld 146 (*)    All other components within normal limits  CBC WITH DIFFERENTIAL/PLATELET - Abnormal; Notable for the following components:   RBC 3.97 (*)    Hemoglobin 12.8 (*)    All other components within normal limits    EKG None  Radiology No results found.  Procedures Procedures (including critical care time)  Medications Ordered in ED Medications - No data to display   Initial Impression / Assessment and Plan / ED Course  I have reviewed the triage vital signs and the nursing notes.  Pertinent labs & imaging results that were available during my care of the patient were reviewed by me and considered in my medical decision making (see chart for details).        Pt on eliquis for afib here for evaluation of hematochezia that by hx is hemorrhoidal.  On examination he has  circumferential hemorrhoids with an area of friability but no active bleeding.  Hemorrhoids were reduced in the ED without difficulty and on recheck after 1.5 hours he had no recurrent bleeding.  He has mild anemia on CBC, similar when compared to prior.  He is completely asymptomatic during ED stay.  D/w Dr. Cher Nakai with General Surgery.  Will treat hemorrhoids with anusol hc, stool softeners.  Plan to d/c home with surgery follow up and return precautions.   Pt was given a surgicel in the event he has recurrent persistent bleeding.    Final Clinical Impressions(s) / ED Diagnoses   Final diagnoses:  Bleeding internal hemorrhoids    ED Discharge Orders         Ordered    hydrocortisone (ANUSOL-HC) 2.5 % rectal cream  3 times daily     08/02/19 1206           Quintella Reichert, MD 08/02/19 1611

## 2019-08-03 ENCOUNTER — Telehealth: Payer: Self-pay | Admitting: Neurology

## 2019-08-03 NOTE — Telephone Encounter (Signed)
I called the patient.  The patient has had chronic issues with hemorrhoid bleeding, he recently had bleeding that he has had difficulty stopping, had to go to the med center for evaluation.  He will cut back on the Eliquis taking one half of a tablet twice daily, if the bleeding remains frequent and difficult to control, he may have to come off the Eliquis until he can be seen.

## 2019-08-03 NOTE — Telephone Encounter (Signed)
Pt called stating he is having bleeding Hemorids and his Colon doctor can not get him in until the 13th of this month and pt was advised to call his Neuro that prescribed the apixaban (ELIQUIS) 5 MG TABS tablet to ask and see if it was ok to come off of it until he is seen. Please advise.

## 2019-08-03 NOTE — Progress Notes (Signed)
Cardiology Office Note    Date:  08/04/2019   ID:  Jose [name]., DOB 1935/12/13, MRN [id number]  PCP:  Karlene Einstein, MD  Cardiologist: Lauree Chandler, MD EPS: None  Chief Complaint  Patient presents with  . Hospitalization Follow-up    History of Present Illness:  Jose [name]. is a 83 y.o. male with history of PAF on eliquis, TIA, HTN, HLD, CAD cath 03/2018 mod non-obstructive disease in LAD-65%, mild in RCA. Echo 03/2018 normal LVEF 60-65%.  LOV Dr. Angelena Form 07/2018 and had some dizziness with orthostasis felt secondary to limiting his oral intake because of frequent urination. He was encourage to push fluids. IF still dizzy holter monitor for bradycardia and possible adjustment beta blocker.  In ED 08/01/09 with BRBPR felt secondary to hemorrhoids. He spoke with GI who told him to decrease Eliquis 2.5 mg bid until he is seen by them on 08/11/19. Dr. Jannifer Franklin concurred. He is still having some bleeding but he is able to stop it. Denies chest pain, shortness or breath, dizziness, or presyncope. Not walking as much as he should because of feet bothering him. Walks 3 blocks when he is able. Has cyst like mass right neck for about a year. When he first developed it he extracted some fluid but none since. PCP says it's a cyst. Hasn't changed in size.    Past Medical History:  Diagnosis Date  . Arthritis   . CAD (coronary artery disease)    04/15/18 65% dLAD, 20% pLAD, 30% 2nd diag, Normal EF  . Dysrhythmia    afib  . Gallstones 07/1996  . Headache    couple times a year  . History of CT scan of head 1997   small vessel disease  . History of ETT 05/2002    no EKG changes  . History of MRI of cervical spine 07/15/2002  . Hypertension   . Lumbar spondylolysis 12/17/2016  . MGUS (monoclonal gammopathy of unknown significance) 04/11/2012  . Occipital neuralgia of left side 12/09/2018  . Peripheral neuropathy   . Pneumonia 10/2014  . PONV (postoperative nausea and  vomiting)    once many years ago  . Primary localized osteoarthritis of left hip 07/08/2018  . Primary localized osteoarthrosis of left shoulder 12/25/2016  . Primary localized osteoarthrosis of right shoulder 11/05/2017  . Rectal bleeding 12/2002   colonoscopy  . Seizures ([place])    last seizure 1976  . Skin cancer   . Sleep apnea    uses CPAP  . Spinal stenosis, lumbar region with neurogenic claudication   . Stroke [facility])    ?tia ?afib  . TIA (transient ischemic attack)   . 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 ANTERIOR CERVICAL DECOMP/DISCECTOMY FUSION N/A 02/04/2015   Procedure: ANTERIOR CERVICAL DECOMPRESSION/DISCECTOMY FUSION CERVICAL FIVE-SIX,CERVICAL SIX-SEVEN;  Surgeon: Karie Chimera, MD;  Location: [place] [facility];  Service: Neurosurgery;  Laterality: N/A;  . BACK SURGERY    . CATARACT EXTRACTION Bilateral   . COLONOSCOPY W/ POLYPECTOMY  04/19/2003   tubular adenoma  . EYE SURGERY     Bilateral Cataracts  . GREAT TOE ARTHRODESIS, INTERPHALANGEAL JOINT Left 1978  . JOINT REPLACEMENT    . joint replacement on right thumb Right   . LEFT HEART CATH AND CORONARY ANGIOGRAPHY N/A 04/15/2018   Procedure: LEFT HEART CATH AND CORONARY ANGIOGRAPHY;  Surgeon: Troy Sine, MD;  Location: [place] [facility];  Service: Cardiovascular;  Laterality: N/A;  . LUMBAR LAMINECTOMY/DECOMPRESSION MICRODISCECTOMY N/A 07/19/2017   Procedure: LAMINECTOMY AND FORAMINOTOMY  LUMBAR TWO- LUMBAR THREE, LUMBAR THREE- LUMBAR FOUR;  Surgeon: Ashok Pall, MD;  Location: [facility];  Service: Neurosurgery;  Laterality: N/A;  . nerve transfer surgery to lwft hand Left    Nov. 2017  . SHOULDER ARTHROSCOPY W/ ROTATOR CUFF REPAIR Left 13   rotator cuff   . SHOULDER INJECTION  June 2012   left, [place]  . SKIN CANCER EXCISION  07/1993   left lateral arm  . SUPERFICIAL KERATECTOMY Right 05/1998    wrist  . thumb injection  June 2012   left, [place]  . TONSILLECTOMY AND ADENOIDECTOMY    . TOTAL HIP ARTHROPLASTY Left 07/08/2018  . TOTAL HIP ARTHROPLASTY Left 07/08/2018   Procedure: LEFT TOTAL HIP ARTHROPLASTY;  Surgeon: Marchia Bond, MD;  Location: [facility];  Service: Orthopedics;  Laterality: Left;  . TOTAL SHOULDER ARTHROPLASTY Left 12/25/2016   Procedure: TOTAL SHOULDER ARTHROPLASTY;  Surgeon: Marchia Bond, MD;  Location: [place];  Service: Orthopedics;  Laterality: Left;  . TOTAL SHOULDER ARTHROPLASTY Right 11/05/2017  . TOTAL SHOULDER ARTHROPLASTY Right 11/05/2017   Procedure: TOTAL SHOULDER ARTHROPLASTY;  Surgeon: Marchia Bond, MD;  Location: [facility];  Service: Orthopedics;  Laterality: Right;  . ulnar nerve transfer  12/1999   left  . ULNAR NERVE TRANSPOSITION Left     Current Medications: Current Meds  Medication Sig  . amLODipine (NORVASC) 5 MG tablet TAKE 1 TABLET DAILY  . apixaban (ELIQUIS) 5 MG TABS tablet Take 1 tablet (5 mg total) by mouth 2 (two) times daily. (Patient taking differently: Take 5 mg by mouth 2 (two) times daily. Taking half dose due to bleeding as of 08/04/2019)  . aspirin EC 81 MG EC tablet Take 1 tablet (81 mg total) by mouth daily.  Marland Kitchen atorvastatin (LIPITOR) 40 MG tablet Take 1 tablet (40 mg total) by mouth daily.  Marland Kitchen HYDROcodone-acetaminophen (NORCO) 10-325 MG tablet Take 1 tablet by mouth every 6 (six) hours as needed.  . hydrocortisone (ANUSOL-HC) 2.5 % rectal cream Place 1 application rectally 3 (three) times daily.  . isosorbide mononitrate (IMDUR) 30 MG 24 hr tablet TAKE 1 TABLET TWICE A DAY (DOSE INCREASE)  . labetalol (NORMODYNE) 200 MG tablet Take 100 mg by mouth 2 (two) times daily.  Marland Kitchen latanoprost (XALATAN) 0.005 % ophthalmic solution Place 1 drop into the right eye at bedtime.  Marland Kitchen losartan (COZAAR) 50 MG tablet Take 50 mg by mouth daily.  . meclizine (ANTIVERT) 25 MG tablet Take 1 tablet (25 mg total) by mouth 3 (three) times daily as needed.  .  Multiple Vitamins-Minerals (PRESERVISION AREDS 2) CAPS Take 1 capsule by mouth 2 (two) times daily.   . nitroGLYCERIN (NITROSTAT) 0.4 MG SL tablet Place 1 tablet (0.4 mg total) under the tongue every 5 (five) minutes x 3 doses as needed for chest pain.  Vladimir Faster Glycol-Propyl Glycol (LUBRICANT EYE DROPS) 0.4-0.3 % SOLN Place 1 drop into the right eye at bedtime.  Marland Kitchen ROCKLATAN 0.02-0.005 % SOLN INSTILL 1 DROP INTO THE RIGHT EYE QHS  . SYSTANE BALANCE 0.6 % SOLN Place 1 drop into both eyes 4 (four) times daily.     Allergies:   Maprotiline and Tetracycline hcl   Social History   Socioeconomic History  . Marital status: Married    Spouse name: [name]  . Number of children:  2  . Years of education: 29  . Highest education level: Not on file  Occupational History  . Occupation: retired    Fish farm manager: UNEMPLOYED  Social Needs  . Financial resource strain: Not on file  . Food insecurity    Worry: Not on file    Inability: Not on file  . Transportation needs    Medical: Not on file    Non-medical: Not on file  Tobacco Use  . Smoking status: Former Smoker    Packs/day: 2.00    Years: 13.00    Pack years: 26.00    Types: Cigarettes    Quit date: 03/05/1968    Years since quitting: 51.4  . Smokeless tobacco: Never Used  Substance and Sexual Activity  . Alcohol use: Yes    Comment: social  . Drug use: No  . Sexual activity: Not Currently  Lifestyle  . Physical activity    Days per week: Not on file    Minutes per session: Not on file  . Stress: Not on file  Relationships  . Social Herbalist on phone: Not on file    Gets together: Not on file    Attends religious service: Not on file    Active member of club or organization: Not on file    Attends meetings of clubs or organizations: Not on file    Relationship status: Not on file  Other Topics Concern  . Not on file  Social History Narrative   Lives with 4th wife, [name], married  2010   Retired, Physicist, medical   Son, [place], [name], 2 children   Daughter, [place], in [place], 2 children   Patient is right handed   Patient drinks approximately 3-4 cups of caffeine daily     Family History:  The patient's   family history includes Dementia in his father; Dementia (age of onset: 62) in his mother; Hypertension in his sister; Transient ischemic attack in his mother.   ROS:   Please see the history of present illness.    ROS All other systems reviewed and are negative.   PHYSICAL EXAM:   VS:  BP 120/84   Pulse 61   Ht 6' (1.829 m)   Wt 223 lb 6.4 oz (101.3 kg)   SpO2 95%   BMI 30.30 kg/m   Physical Exam  GEN: Obese, in no acute distress  Neck: cyst right neck mobile-monitored by PCP. no JVD, carotid bruits, or masses Cardiac:RRR; no murmurs, rubs, or gallops  Respiratory:  clear to auscultation bilaterally, normal work of breathing GI: soft, nontender, nondistended, + BS Ext: without cyanosis, clubbing, or edema, Good distal pulses bilaterally Neuro:  Alert and Oriented x 3 Psych: euthymic mood, full affect  Wt Readings from Last 3 Encounters:  08/04/19 223 lb 6.4 oz (101.3 kg)  08/02/19 227 lb 1.2 oz (103 kg)  02/27/19 227 lb (103 kg)      Studies/Labs Reviewed:   EKG:  EKG is  ordered today.  The ekg ordered today demonstrates NSR with LAFB   Recent Labs: 08/02/2019: BUN 18; Creatinine, Ser 0.84; Hemoglobin 12.8; Platelets 161; Potassium 3.5; Sodium 139   Lipid Panel    Component Value Date/Time   CHOL 85 (L) 08/04/2018 0838   TRIG 59 08/04/2018 0838   HDL 41 08/04/2018 0838   CHOLHDL 2.1 08/04/2018 0838   CHOLHDL 3.8 04/15/2018 0351   VLDL 39 04/15/2018 0351   LDLCALC 32 08/04/2018 [id number]  LDLDIRECT 120 (H) 08/03/2010 1719    Additional studies/ records that were reviewed today include:    Echo 6/2019Study Conclusions   - Left ventricle: The cavity size was normal. Wall thickness was   increased in a  pattern of moderate LVH. Systolic function was   normal. The estimated ejection fraction was in the range of 60%   to 65%. Wall motion was normal; there were no regional wall   motion abnormalities. Doppler parameters are consistent with   abnormal left ventricular relaxation (grade 1 diastolic   dysfunction). - Aorta: Aortic root dimension: 40 mm (ED). - Aortic root: The aortic root is mildly dilated. - Mitral valve: Mildly thickened leaflets . There was trivial   regurgitation. - Left atrium: The atrium was normal in size. - Inferior vena cava: The vessel was normal in size. The   respirophasic diameter changes were in the normal range (>= 50%),   consistent with normal central venous pressure.   Impressions:   - Compared to a prior study in 2015, the LVEF is higher at 60-65%.   The aortic root is dilated to 4.0 cm.    NST 03/2018  here was no ST segment deviation noted during stress.  Defect 1: There is a medium defect of moderate severity.  Findings consistent with ischemia.  This is an intermediate risk study.  Nuclear stress EF: 53%.  No T wave inversion was noted during stress.   Medium size, moderate severity partially reversible inferoseptal and apical perfusion defect (SDS 5), suggestive of ischemia. LVEF 53%. Borderline elevated TID ratio of 1.2. This is an intermediate risk study. Clinical correlation is advised.      ASSESSMENT:    1. PAF (paroxysmal atrial fibrillation) ([place])   2. TIA (transient ischemic attack)   3. HYPERTENSION, BENIGN SYSTEMIC   4. Coronary artery disease involving native coronary artery of native heart without angina pectoris   5. Hyperlipidemia, unspecified hyperlipidemia type   6. Lump on neck      PLAN:  In order of problems listed above:  PAF on Eliquis with BRBPR felt secondary to hemorrhoids. CBC stable Hbg 12.8. GI has decreased eliquis 2.5 mg bid until they can evaluate. Increase to 5 mg BID once bleeding resolves   History of TIA Dr. Jannifer Franklin aware that eliquis dose was decreased to 2.5 mg bid temporarily.  HTN BP well controlled  CAD nonobstructive on cath   HLD LDL 59 04/2019 [facility]  Right neck cyst-refer to ENT to make sure not enlarged lymphnode.    Medication Adjustments/Labs and Tests Ordered: Current medicines are reviewed at length with the patient today.  Concerns regarding medicines are outlined above.  Medication changes, Labs and Tests ordered today are listed in the Patient Instructions below. Patient Instructions  Medication Instructions:  Your physician recommends that you continue on your current medications as directed. Please refer to the Current Medication list given to you today.  If you need a refill on your cardiac medications before your next appointment, please call your pharmacy.   Lab work: None Ordered  If you have labs (blood work) drawn today and your tests are completely normal, you will receive your results only by: Marland Kitchen MyChart Message (if you have MyChart) OR . A paper copy in the mail If you have any lab test that is abnormal or we need to change your treatment, we will call you to review the results.  Testing/Procedures: None ordered  Follow-Up: You have been referred to  ENT, Dr. Wilburn Cornelia for right neck cyst  At [facility], you and your health needs are our priority.  As part of our continuing mission to provide you with exceptional heart care, we have created designated Provider Care Teams.  These Care Teams include your primary Cardiologist (physician) and Advanced Practice Providers (APPs -  Physician Assistants and Nurse Practitioners) who all work together to provide you with the care you need, when you need it. . You will need a follow up appointment in 1 year.  Please call our office 2 months in advance to schedule this appointment.  You may see Darlina Guys, MD or one of the following Advanced Practice Providers on your  designated Care Team:   . Lyda Jester, PA-C . Dayna Dunn, PA-C . Ermalinda Barrios, PA-C  Any Other Special Instructions Will Be Listed Below (If Applicable).       Sumner Boast, PA-C  08/04/2019 12:48 PM    [facility] [place], [place], [place]  [address] Phone: [phone]; Fax: [phone]

## 2019-08-04 ENCOUNTER — Ambulatory Visit (INDEPENDENT_AMBULATORY_CARE_PROVIDER_SITE_OTHER): Payer: Medicare Other | Admitting: Physician Assistant

## 2019-08-04 ENCOUNTER — Encounter: Payer: Self-pay | Admitting: Physician Assistant

## 2019-08-04 ENCOUNTER — Other Ambulatory Visit: Payer: Self-pay

## 2019-08-04 VITALS — BP 120/84 | HR 61 | Ht 72.0 in | Wt 223.4 lb

## 2019-08-04 DIAGNOSIS — E785 Hyperlipidemia, unspecified: Secondary | ICD-10-CM

## 2019-08-04 DIAGNOSIS — G459 Transient cerebral ischemic attack, unspecified: Secondary | ICD-10-CM | POA: Diagnosis not present

## 2019-08-04 DIAGNOSIS — I2510 Atherosclerotic heart disease of native coronary artery without angina pectoris: Secondary | ICD-10-CM

## 2019-08-04 DIAGNOSIS — R221 Localized swelling, mass and lump, neck: Secondary | ICD-10-CM

## 2019-08-04 DIAGNOSIS — I480 Paroxysmal atrial fibrillation: Secondary | ICD-10-CM | POA: Diagnosis not present

## 2019-08-04 DIAGNOSIS — I10 Essential (primary) hypertension: Secondary | ICD-10-CM

## 2019-08-04 NOTE — Patient Instructions (Signed)
Medication Instructions:  Your physician recommends that you continue on your current medications as directed. Please refer to the Current Medication list given to you today.  If you need a refill on your cardiac medications before your next appointment, please call your pharmacy.   Lab work: None Ordered  If you have labs (blood work) drawn today and your tests are completely normal, you will receive your results only by: Marland Kitchen MyChart Message (if you have MyChart) OR . A paper copy in the mail If you have any lab test that is abnormal or we need to change your treatment, we will call you to review the results.  Testing/Procedures: None ordered  Follow-Up: You have been referred to ENT, Dr. Wilburn Cornelia for right neck cyst  At [facility], you and your health needs are our priority.  As part of our continuing mission to provide you with exceptional heart care, we have created designated Provider Care Teams.  These Care Teams include your primary Cardiologist (physician) and Advanced Practice Providers (APPs -  Physician Assistants and Nurse Practitioners) who all work together to provide you with the care you need, when you need it. . You will need a follow up appointment in 1 year.  Please call our office 2 months in advance to schedule this appointment.  You may see Darlina Guys, MD or one of the following Advanced Practice Providers on your designated Care Team:   . Lyda Jester, PA-C . Dayna Dunn, PA-C . Ermalinda Barrios, PA-C  Any Other Special Instructions Will Be Listed Below (If Applicable).

## 2019-08-07 ENCOUNTER — Other Ambulatory Visit: Payer: Self-pay | Admitting: Cardiovascular Disease

## 2019-09-21 MED FILL — NITROGLYCERIN 0.4 MG TAB SL: 0.4 | 25 days supply | Qty: 25 | Fill #1

## 2019-09-22 DIAGNOSIS — G4733 Obstructive sleep apnea (adult) (pediatric): Secondary | ICD-10-CM | POA: Insufficient documentation

## 2019-10-13 MED FILL — AMOXICILLIN 500 MG CAPSULE: 500 | 2 days supply | Qty: 8 | Fill #0

## 2019-12-07 ENCOUNTER — Ambulatory Visit: Payer: Medicare Other | Admitting: Neurology

## 2019-12-21 ENCOUNTER — Ambulatory Visit (INDEPENDENT_AMBULATORY_CARE_PROVIDER_SITE_OTHER): Payer: Medicare Other | Admitting: Neurology

## 2019-12-21 ENCOUNTER — Encounter: Payer: Self-pay | Admitting: Neurology

## 2019-12-21 ENCOUNTER — Telehealth: Payer: Self-pay | Admitting: Neurology

## 2019-12-21 ENCOUNTER — Other Ambulatory Visit: Payer: Self-pay

## 2019-12-21 VITALS — BP 116/64 | HR 58 | Temp 97.1°F | Ht 73.0 in | Wt 223.3 lb

## 2019-12-21 DIAGNOSIS — G4733 Obstructive sleep apnea (adult) (pediatric): Secondary | ICD-10-CM | POA: Diagnosis not present

## 2019-12-21 DIAGNOSIS — Z9989 Dependence on other enabling machines and devices: Secondary | ICD-10-CM | POA: Diagnosis not present

## 2019-12-21 NOTE — Progress Notes (Signed)
Subjective:    Patient ID: Jose [name]. is a 84 y.o. male.  HPI     Interim history:   Jose [name] is an 84 year old right-handed gentleman with an underlying medical history of new onset A. fib, obesity, reflux disease, recurrent TIA, hypertension, MGUS, ulnar neuropathy and arthritis, who presents for follow-up consultation of his OSA, treated with CPAP therapy. The patient is unaccompanied and presents for his yearly checkup. I last saw him on 12/05/2019, at which time he was compliant with his CPAP and doing well.    Today, 12/21/2019: I reviewed his CPAP compliance data from 11/16/2019 through 12/15/2019 which is a total of 30 days, during which time he used his machine every night with percent use days greater than 4 hours at 100%, indicating superb compliance with an average usage of 7 hours and 40 minutes, residual AHI at goal at 0.2/h, leak on the higher side with a 95th percentile at 21.8 L/min on a pressure of 12 cm with EPR of 3. He reports generally doing very well with his CPAP.  He is fully compliant and benefits from it but has congestion that affects his left nostril and also drainage.  He is using a nasal mask, he has tried a nasal rinse years ago, he would be willing to retry it.  He would be willing to try a different nasal interface as well.  He is typically up-to-date with his supplies through [facility].     The patient's allergies, current medications, family history, past medical history, past social history, past surgical history and problem list were reviewed and updated as appropriate.    Previously (copied from previous notes for reference):    I saw him on 12/04/2017, at which time he was fully compliant with his CPAP. Of note, he had interim left shoulder replacement surgery on 12/25/2016, then had lower back surgery in September 2018, then had right shoulder replacement in January 2019.   I reviewed his CPAP compliance data from  11/03/2018 through 12/02/2018 which is a total of 30 days, during which time he used his CPAP every night with percent used days greater than 4 hours at 100%, indicating superb compliance with an average usage of 7 hours and 26 minutes, residual AHI at goal at 0.3 per hour, leak on the high side with the 95th percentile at 29.8 L/m on a pressure of 12 cm with EPR of 3.      I saw him on 12/04/2016, at which time he reported doing well with CPAP. He was supposed to have left total shoulder replacement in February 2018.   I reviewed his CPAP compliance data from 11/03/2017 through 12/02/2017 which is a total of 30 days, during which time he used his CPAP every night with percent used days greater than 4 hours at 100%, indicating superb compliance with an average usage of 7 hours and 28 minutes, residual AHI at goal at 0.3 per hour, leak on the higher end with the 95th percentile at 17.8 L/m on a pressure of 12 cm with EPR of 3.  I saw him on 12/05/2015, at which time we talked about his split-night sleep study results from 03/01/2015 and his compliance with CPAP. He was fully compliant with CPAP therapy. I suggested with a checkup. He saw Dr. Jannifer Franklin in the interim in March 2017. He had been treated in January 2017 for a community-acquired pneumonia. He then presented to the emergency room in January 2017 a  balance issues and was diagnosed with vertigo, placed on meclizine as needed.    I reviewed his CPAP compliance data from 11/03/2016 through 12/02/2016, which is a total of 30 days, during which time he used his CPAP every night with percent used days greater than 4 hours at 100%, indicating superb compliance with an average usage of 7 hours and 33 minutes, residual AHI 0.3 per hour, leak low with the 95th percentile at 10.2 L/m on a pressure of 12 cm with EPR of 3.    I first met him on 06/01/2015 at the request of Dr. Jannifer Franklin, at which time we talked about his split-night sleep study results from  03/01/2015 as well as his compliance data. He reported doing well. He had adjusted well to treatment. He felt improved with respect to sleep disruption, daytime somnolence, and sleep quality. He had recent ulnar nerve surgery about 3 weeks prior. He was taking gabapentin for neuropathy. He was seeing a cardiologist out of [place] and was on Eliquis. He goes to bed around 11 PM and his rise time is around 7 AM. He wakes up better rested. His neuropathy seems stable to him. In the interim, he had neck surgery on 02/04/2015 under Dr. Hal Neer. He feels improved. He has a follow-up with his hand surgeon next week. He has not had any recent TIA type symptoms. He drinks alcohol occasionally. He quit smoking in the late 60s. He drinks quite a bit of coffee, at least 2 12 ounce cups a day.   I reviewed his CPAP compliance data from 11/01/2015 through 11/30/2015 which is a total of 30 days during which time he used his machine every night with percent used days greater than 4 hours at 100%, indicating superb compliance with an average usage of 7 hours and 29 minutes, residual AHI low at 0.4 per hour, leak borderline with the 95th percentile at 24.3 L/m on a pressure of 12 cm with EPR of 3.   06/01/2015: He was referred for a sleep study secondary to a report of witnessed apneas during his hospitalization in 1/16, and snoring reported. He had a split-night sleep study on 03/01/2015 and went over his test results with him in detail today. His baseline sleep efficiency was reduced at 77% with a latency to sleep of 5.5 minutes and wake after sleep onset of 29.5 minutes with moderate sleep fragmentation noted. He had a markedly elevated arousal index at 73.3 per hour, secondary to respiratory events. He had absence of slow-wave sleep and REM sleep prior to CPAP initiation. He had no significant PLMS, EKG or EEG changes. He had moderate snoring. He had a total AHI highly elevated at 71.3 per hour, average oxygen saturation  was 93%, nadir was 83% during non-REM sleep. He was then placed on CPAP therapy. Sleep efficiency was 87.5% with a latency to sleep of 4 minutes and wake after sleep onset of 27 minutes with mild to moderate sleep fragmentation noted. He had a tremendous improvement in his arousal index to 3.6 per hour. He had 12.2% of slow-wave sleep and achieved 28.9% of REM sleep. Average oxygen saturation was 93%, nadir was 81%. He had no significant PLMS during the treatment portion of the study. CPAP was titrated from 5 cm to 13 cm with a reduction of his AHI to 2.8 per hour at a pressure of 12 cm. Based on his test results I prescribed CPAP therapy for home use.    I reviewed his CPAP compliance data  from 05/01/2015 through 05/30/2015 which is a total of 30 days during which time he used his machine every night with percent used days greater than 4 hours at 93%, indicating excellent compliance with an average usage of 6 hours and 32 minutes, residual AHI at 3.4 per hour, leak acceptable with the 95th percentile at 20.2 L/m on a pressure of 12 cm with EPR of 3.  His Past Medical History Is Significant For: Past Medical History:  Diagnosis Date  . Arthritis   . CAD (coronary artery disease)    04/15/18 65% dLAD, 20% pLAD, 30% 2nd diag, Normal EF  . Dysrhythmia    afib  . Gallstones 07/1996  . Headache    couple times a year  . History of CT scan of head 1997   small vessel disease  . History of ETT 05/2002    no EKG changes  . History of MRI of cervical spine 07/15/2002  . Hypertension   . Lumbar spondylolysis 12/17/2016  . MGUS (monoclonal gammopathy of unknown significance) 04/11/2012  . Occipital neuralgia of left side 12/09/2018  . Peripheral neuropathy   . Pneumonia 10/2014  . PONV (postoperative nausea and vomiting)    once many years ago  . Primary localized osteoarthritis of left hip 07/08/2018  . Primary localized osteoarthrosis of left shoulder 12/25/2016  . Primary localized osteoarthrosis of  right shoulder 11/05/2017  . Rectal bleeding 12/2002   colonoscopy  . Seizures ([place])    last seizure 1976  . Skin cancer   . Sleep apnea    uses CPAP  . Spinal stenosis, lumbar region with neurogenic claudication   . Stroke [facility])    ?tia ?afib  . TIA (transient ischemic attack)   . 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 extremity 03/29/2015    His Past Surgical History Is Significant For: Past Surgical History:  Procedure Laterality Date  . ANTERIOR CERVICAL DECOMP/DISCECTOMY FUSION N/A 02/04/2015   Procedure: ANTERIOR CERVICAL DECOMPRESSION/DISCECTOMY FUSION CERVICAL FIVE-SIX,CERVICAL SIX-SEVEN;  Surgeon: Karie Chimera, MD;  Location: [facility];  Service: Neurosurgery;  Laterality: N/A;  . BACK SURGERY    . CATARACT EXTRACTION Bilateral   . COLONOSCOPY W/ POLYPECTOMY  04/19/2003   tubular adenoma  . EYE SURGERY     Bilateral Cataracts  . GREAT TOE ARTHRODESIS, INTERPHALANGEAL JOINT Left 1978  . JOINT REPLACEMENT    . joint replacement on right thumb Right   . LEFT HEART CATH AND CORONARY ANGIOGRAPHY N/A 04/15/2018   Procedure: LEFT HEART CATH AND CORONARY ANGIOGRAPHY;  Surgeon: Troy Sine, MD;  Location: [place] [facility];  Service: Cardiovascular;  Laterality: N/A;  . LUMBAR LAMINECTOMY/DECOMPRESSION MICRODISCECTOMY N/A 07/19/2017   Procedure: LAMINECTOMY AND FORAMINOTOMY  LUMBAR TWO- LUMBAR THREE, LUMBAR THREE- LUMBAR FOUR;  Surgeon: Ashok Pall, MD;  Location: [place];  Service: Neurosurgery;  Laterality: N/A;  . nerve transfer surgery to lwft hand Left    Nov. 2017  . SHOULDER ARTHROSCOPY W/ ROTATOR CUFF REPAIR Left 15   rotator cuff   . SHOULDER INJECTION  June 2012   left, [place]  . SKIN CANCER EXCISION  07/1993   left lateral arm  . SUPERFICIAL KERATECTOMY Right 05/1998   wrist  . thumb injection  June 2012   left, [place]  . TONSILLECTOMY AND ADENOIDECTOMY    . TOTAL HIP ARTHROPLASTY Left 07/08/2018  . TOTAL HIP  ARTHROPLASTY Left 07/08/2018   Procedure: LEFT TOTAL HIP ARTHROPLASTY;  Surgeon: Marchia Bond, MD;  Location: [facility];  Service: Orthopedics;  Laterality: Left;  . TOTAL SHOULDER ARTHROPLASTY Left 12/25/2016   Procedure: TOTAL SHOULDER ARTHROPLASTY;  Surgeon: Marchia Bond, MD;  Location: [place];  Service: Orthopedics;  Laterality: Left;  . TOTAL SHOULDER ARTHROPLASTY Right 11/05/2017  . TOTAL SHOULDER ARTHROPLASTY Right 11/05/2017   Procedure: TOTAL SHOULDER ARTHROPLASTY;  Surgeon: Marchia Bond, MD;  Location: [place];  Service: Orthopedics;  Laterality: Right;  . ulnar nerve transfer  12/1999   left  . ULNAR NERVE TRANSPOSITION Left     His Family History Is Significant For: Family History  Problem Relation Age of Onset  . Dementia Mother 64       vascular  . Transient ischemic attack Mother   . Dementia Father        after hip fracture  . Hypertension Sister        skin cancer  . Seizures Neg Hx     His Social History Is Significant For: Social History   Socioeconomic History  . Marital status: Married    Spouse name: [name]  . Number of children: 2  . Years of education: 39  . Highest education level: Not on file  Occupational History  . Occupation: retired    Fish farm manager: UNEMPLOYED  Tobacco Use  . Smoking status: Former Smoker    Packs/day: 2.00    Years: 13.00    Pack years: 26.00    Types: Cigarettes    Quit date: 03/05/1968    Years since quitting: 51.8  . Smokeless tobacco: Never Used  Substance and Sexual Activity  . Alcohol use: Yes    Comment: social  . Drug use: No  . Sexual activity: Not Currently  Other Topics Concern  . Not on file  Social History Narrative   Lives with 4th wife, [name], married 2010   Retired, organizational psychology   [employer]   Son, [place], [name], 2 children   Daughter, [place], in [place], 2 children   Patient is right handed   Patient drinks approximately 3-4 cups of caffeine daily   Social  Determinants of Health   Financial Resource Strain:   . Difficulty of Paying Living Expenses: Not on file  Food Insecurity:   . Worried About Charity fundraiser in the Last Year: Not on file  . Ran Out of Food in the Last Year: Not on file  Transportation Needs:   . Lack of Transportation (Medical): Not on file  . Lack of Transportation (Non-Medical): Not on file  Physical Activity:   . Days of Exercise per Week: Not on file  . Minutes of Exercise per Session: Not on file  Stress:   . Feeling of Stress : Not on file  Social Connections:   . Frequency of Communication with Friends and Family: Not on file  . Frequency of Social Gatherings with Friends and Family: Not on file  . Attends Religious Services: Not on file  . Active Member of Clubs or Organizations: Not on file  . Attends Archivist Meetings: Not on file  . Marital Status: Not on file    His Allergies Are:  Allergies  Allergen Reactions  . Maprotiline Other (See Comments)    TETRACYCLIC ANTIDEPRESSANTS UNSPECIFIED REACTION (patient is unaware of allergy) TETRACYCLIC ANTIDEPRESSANTS UNSPECIFIED REACTION (patient is unaware of allergy)  . Tetracycline Hcl Itching and Rash    Rash on hands  :   His Current Medications Are:  Outpatient Encounter Medications as of 12/21/2019  Medication Sig  . amLODipine (NORVASC) 5 MG tablet TAKE 1 TABLET DAILY  . apixaban (ELIQUIS) 5 MG TABS tablet Take 1 tablet (5 mg total) by mouth 2 (two) times daily. (Patient taking differently: Take 5 mg by mouth 2 (two) times daily. Taking half dose due to bleeding as of 08/04/2019)  . aspirin EC 81 MG EC tablet Take 1 tablet (81 mg total) by mouth daily.  Marland Kitchen atorvastatin (LIPITOR) 40 MG tablet TAKE 1 TABLET DAILY  . HYDROcodone-acetaminophen (NORCO) 10-325 MG tablet Take 1 tablet by mouth every 6 (six) hours as needed.  . isosorbide mononitrate (IMDUR) 30 MG 24 hr tablet TAKE 1 TABLET TWICE A DAY (DOSE INCREASE)  . labetalol  (NORMODYNE) 200 MG tablet Take 100 mg by mouth 2 (two) times daily.  Marland Kitchen losartan (COZAAR) 50 MG tablet Take 50 mg by mouth daily.  . Multiple Vitamins-Minerals (PRESERVISION AREDS 2) CAPS Take 1 capsule by mouth 2 (two) times daily.   . nitroGLYCERIN (NITROSTAT) 0.4 MG SL tablet Place 1 tablet (0.4 mg total) under the tongue every 5 (five) minutes x 3 doses as needed for chest pain.  Marland Kitchen ROCKLATAN 0.02-0.005 % SOLN INSTILL 1 DROP INTO THE RIGHT EYE QHS  . [DISCONTINUED] hydrocortisone (ANUSOL-HC) 2.5 % rectal cream Place 1 application rectally 3 (three) times daily.  . [DISCONTINUED] latanoprost (XALATAN) 0.005 % ophthalmic solution Place 1 drop into the right eye at bedtime.  . [DISCONTINUED] meclizine (ANTIVERT) 25 MG tablet Take 1 tablet (25 mg total) by mouth 3 (three) times daily as needed.  . [DISCONTINUED] Polyethyl Glycol-Propyl Glycol (LUBRICANT EYE DROPS) 0.4-0.3 % SOLN Place 1 drop into the right eye at bedtime.  . [DISCONTINUED] SYSTANE BALANCE 0.6 % SOLN Place 1 drop into both eyes 4 (four) times daily.   No facility-administered encounter medications on file as of 12/21/2019.  :  Review of Systems:  Out of a complete 14 point review of systems, all are reviewed and negative with the exception of these symptoms as listed below: Review of Systems  Neurological:       1 year f/u on CPAP. Pt reports over all he has been doing well with the machine. Report he does keep sinus drainage.     Objective:  Neurological Exam  Physical Exam Physical Examination:   Vitals:   12/21/19 1126  BP: 116/64  Pulse: (!) 58  Temp: (!) 97.1 F (36.2 C)    General Examination: The patient is a very pleasant 84 y.o. male in no acute distress. He appears well-developed and well-nourished and well groomed.   HEENT:Normocephalic, atraumatic, pupils are equal, round and reactive to light and accommodation. Extraocular tracking is good without limitation to gaze excursion or nystagmus noted. Normal  smooth pursuit is noted. Hearing is grossly intact. s/p cataract surgeries.Face is symmetric with normal facial animation and normal facial sensation. Speech is clear with no dysarthria noted. There is no hypophonia. There is no lip, neck/head, jaw or voice tremor. Neck shows FROM. Oropharynx exam reveals:mild mouth dryness, adequatedental hygiene and moderateairway crowding. Mallampati is class II. Tongue protrudes centrally and palate elevates symmetrically. Tonsils are absent.   Chest:Clear to auscultation without wheezing, rhonchi or crackles noted.  Heart:S1+S2+0, regular and normal without murmurs, rubs or gallops noted. Mild bradycardia.    Abdomen:Soft, non-tender and non-distended.  Extremities:There is no edema bilaterally.  Skin: Warm and dry without trophic changes noted.  Musculoskeletal: exam reveals no obvious new issue.   Neurologically:  Mental status: The patient is awake, alert and oriented in all 4 spheres.Hisimmediate and remote memory, attention, language skills and fund of knowledge are appropriate. There is no evidence of aphasia, agnosia, apraxia or anomia. Speech is clear with normal prosody and enunciation. Thought process is linear. Mood is normaland affect is normal.  Cranial nerves II - XII are as described above under HEENT exam.  Motor exam: Normal bulk, strength and tone is noted. There is no tremor. Romberg is not testable safely.Fine motor skills and coordination:grosslyintact. Cerebellar testing: No dysmetria or intention tremor, no gait ataxia.  Sensory exam: intact to light touch in the upper and lower extremities.  Gait, station and balance:Hestands withmilddifficulty.Stance is wide based and posture is age-appropriate. Tandem walk isnot testable safely.  Assessmentand Plan:   In summary,Jose [name].is a very pleasant63 year oldmalewith an underlying medicalhistory ofA fib, GERD, TIAs, HTN, MGUS, PN, and  s/p ulnar nerve surgery, History of arthritis with s/p joint replacement and back surgeries, who presents for follow-up consultation of his obstructive sleep apnea for which he has been on CPAP with full compliance and ongoing good results. He is commended for his treatment adherence.  He is advised to talk to his DME company about trying a nasal cushion interface which starts under the nose, not nasal pillows, he has currently been using a nasal mask.  He would be willing to try a different interface.  He is advised to follow-up routinely in 1 year. We reviewed his compliance data together today. I answered all his questions today and the patient was in agreement. I spent 20 minutes in total face-to-face time and in reviewing records during pre-charting, more than 50% of which was spent in counseling and coordination of care, reviewing test results, reviewing medications and treatment regimen and/or in discussing or reviewing the diagnosis of OSA, the prognosis and treatment options. Pertinent laboratory and imaging test results that were available during this visit with the patient were reviewed by me and considered in my medical decision making (see chart for details).

## 2019-12-21 NOTE — Telephone Encounter (Signed)
Pt saw dr. Jannifer Franklin a year ago and is requesting to see him again due to worsening neuropathy. Please advise.

## 2019-12-21 NOTE — Patient Instructions (Signed)
Please continue using your CPAP regularly. While your insurance requires that you use CPAP at least 4 hours each night on 70% of the nights, I recommend, that you not skip any nights and use it throughout the night if you can. Getting used to CPAP and staying with the treatment long term does take time and patience and discipline. Untreated obstructive sleep apnea when it is moderate to severe can have an adverse impact on cardiovascular health and raise her risk for heart disease, arrhythmias, hypertension, congestive heart failure, stroke and diabetes. Untreated obstructive sleep apnea causes sleep disruption, nonrestorative sleep, and sleep deprivation. This can have an impact on your day to day functioning and cause daytime sleepiness and impairment of cognitive function, memory loss, mood disturbance, and problems focussing. Using CPAP regularly can improve these symptoms.  As discussed, we will try a different style of nasal mask which starts under the nose, not nasal pillows but more in the shape of a nasal cushion interface.  I will send an order to your DME company. Keep up the good work! I will see you back in 1 year.

## 2019-12-22 NOTE — Telephone Encounter (Signed)
Patient call to state his neuropathy is getting worse from his knees to his feet. Its also affecting his toes with a sharp pain. It feels like a loss of sensation. I schedule pt at next available. Pt verbalized understanding.

## 2020-01-11 ENCOUNTER — Other Ambulatory Visit: Payer: Self-pay | Admitting: Neurology

## 2020-01-14 ENCOUNTER — Encounter: Payer: Self-pay | Admitting: Neurology

## 2020-01-14 ENCOUNTER — Other Ambulatory Visit: Payer: Self-pay

## 2020-01-14 ENCOUNTER — Ambulatory Visit (INDEPENDENT_AMBULATORY_CARE_PROVIDER_SITE_OTHER): Payer: Medicare Other | Admitting: Neurology

## 2020-01-14 VITALS — BP 142/89 | HR 56 | Temp 97.1°F | Ht 73.0 in | Wt 220.0 lb

## 2020-01-14 DIAGNOSIS — E538 Deficiency of other specified B group vitamins: Secondary | ICD-10-CM

## 2020-01-14 DIAGNOSIS — D472 Monoclonal gammopathy: Secondary | ICD-10-CM

## 2020-01-14 DIAGNOSIS — G609 Hereditary and idiopathic neuropathy, unspecified: Secondary | ICD-10-CM | POA: Diagnosis not present

## 2020-01-14 NOTE — Progress Notes (Signed)
Reason for visit: Peripheral neuropathy  Jose [name]. is an 84 y.o. male  History of present illness:  Jose [name] is an 84 year old right-handed white male with a history of peripheral neuropathy.  The patient has a history of borderline diabetes but he has also been followed in the past for a monoclonal antibody by Dr. Lamonte Sakai.  The patient himself does not recollect anything about this.  The patient has come into the office today with some worsening symptoms of the neuropathy.  He has noted some lancinating pains in the feet, starting on the right foot and then going to the left.  He has a sensation of numbness up to the knees.  He has noted some slight imbalance problems, he has a history of bilateral foot drops.  He has had significant issues with a left ulnar neuropathy in the past and he has had some occasional left occipital neuralgia symptoms.  The patient indicates that he does not want medications for the pain, he wishes to do anything possible to slow down progression of the neuropathy.  He has not had any falls.  He is able to sleep at night.  Past Medical History:  Diagnosis Date  . Arthritis   . CAD (coronary artery disease)    04/15/18 65% dLAD, 20% pLAD, 30% 2nd diag, Normal EF  . Dysrhythmia    afib  . Gallstones 07/1996  . Headache    couple times a year  . History of CT scan of head 1997   small vessel disease  . History of ETT 05/2002    no EKG changes  . History of MRI of cervical spine 07/15/2002  . Hypertension   . Lumbar spondylolysis 12/17/2016  . MGUS (monoclonal gammopathy of unknown significance) 04/11/2012  . Occipital neuralgia of left side 12/09/2018  . Peripheral neuropathy   . Pneumonia 10/2014  . PONV (postoperative nausea and vomiting)    once many years ago  . Primary localized osteoarthritis of left hip 07/08/2018  . Primary localized osteoarthrosis of left shoulder 12/25/2016  . Primary localized osteoarthrosis of right shoulder 11/05/2017  . Rectal  bleeding 12/2002   colonoscopy  . Seizures ([place])    last seizure 1976  . Skin cancer   . Sleep apnea    uses CPAP  . Spinal stenosis, lumbar region with neurogenic claudication   . Stroke [facility])    ?tia ?afib  . TIA (transient ischemic attack)   . 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 ANTERIOR CERVICAL DECOMP/DISCECTOMY FUSION N/A 02/04/2015   Procedure: ANTERIOR CERVICAL DECOMPRESSION/DISCECTOMY FUSION CERVICAL FIVE-SIX,CERVICAL SIX-SEVEN;  Surgeon: Karie Chimera, MD;  Location: [facility];  Service: Neurosurgery;  Laterality: N/A;  . BACK SURGERY    . CATARACT EXTRACTION Bilateral   . COLONOSCOPY W/ POLYPECTOMY  04/19/2003   tubular adenoma  . EYE SURGERY     Bilateral Cataracts  . GREAT TOE ARTHRODESIS, INTERPHALANGEAL JOINT Left 1978  . JOINT REPLACEMENT    . joint replacement on right thumb Right   . LEFT HEART CATH AND CORONARY ANGIOGRAPHY N/A 04/15/2018   Procedure: LEFT HEART CATH AND CORONARY ANGIOGRAPHY;  Surgeon: Troy Sine, MD;  Location: [facility];  Service: Cardiovascular;  Laterality: N/A;  . LUMBAR LAMINECTOMY/DECOMPRESSION MICRODISCECTOMY N/A 07/19/2017   Procedure: LAMINECTOMY AND FORAMINOTOMY  LUMBAR TWO- LUMBAR THREE, LUMBAR  THREE- LUMBAR FOUR;  Surgeon: Ashok Pall, MD;  Location: [facility];  Service: Neurosurgery;  Laterality: N/A;  . nerve transfer surgery to lwft hand Left    Nov. 2017  . SHOULDER ARTHROSCOPY W/ ROTATOR CUFF REPAIR Left 15   rotator cuff   . SHOULDER INJECTION  June 2012   left, [place]  . SKIN CANCER EXCISION  07/1993   left lateral arm  . SUPERFICIAL KERATECTOMY Right 05/1998   wrist  . thumb injection  June 2012   left, [place]  . TONSILLECTOMY AND ADENOIDECTOMY    . TOTAL HIP ARTHROPLASTY Left 07/08/2018  . TOTAL HIP ARTHROPLASTY Left 07/08/2018   Procedure: LEFT TOTAL HIP ARTHROPLASTY;  Surgeon:  Marchia Bond, MD;  Location: [facility];  Service: Orthopedics;  Laterality: Left;  . TOTAL SHOULDER ARTHROPLASTY Left 12/25/2016   Procedure: TOTAL SHOULDER ARTHROPLASTY;  Surgeon: Marchia Bond, MD;  Location: [facility];  Service: Orthopedics;  Laterality: Left;  . TOTAL SHOULDER ARTHROPLASTY Right 11/05/2017  . TOTAL SHOULDER ARTHROPLASTY Right 11/05/2017   Procedure: TOTAL SHOULDER ARTHROPLASTY;  Surgeon: Marchia Bond, MD;  Location: [facility];  Service: Orthopedics;  Laterality: Right;  . ulnar nerve transfer  12/1999   left  . ULNAR NERVE TRANSPOSITION Left     Family History  Problem Relation Age of Onset  . Dementia Mother 40       vascular  . Transient ischemic attack Mother   . Dementia Father        after hip fracture  . Hypertension Sister        skin cancer  . Seizures Neg Hx     Social history:  reports that he quit smoking about 51 years ago. His smoking use included cigarettes. He has a 26.00 pack-year smoking history. He has never used smokeless tobacco. He reports current alcohol use. He reports that he does not use drugs.    Allergies  Allergen Reactions  . Maprotiline Other (See Comments)    TETRACYCLIC ANTIDEPRESSANTS UNSPECIFIED REACTION (patient is unaware of allergy) TETRACYCLIC ANTIDEPRESSANTS UNSPECIFIED REACTION (patient is unaware of allergy)  . Tetracycline Hcl Itching and Rash    Rash on hands    Medications:  Prior to Admission medications   Medication Sig Start Date End Date Taking? Authorizing Provider  amLODipine (NORVASC) 5 MG tablet TAKE 1 TABLET DAILY 06/08/19  Yes [provider]  apixaban (ELIQUIS) 5 MG TABS tablet Take 1 tablet (5 mg total) by mouth 2 (two) times daily. Patient taking differently: Take 5 mg by mouth 2 (two) times daily. Taking half dose due to bleeding as of 08/04/2019 07/29/19  Yes Kathrynn Ducking, MD  aspirin EC 81 MG EC tablet Take 1 tablet (81 mg total) by mouth daily. 04/17/18  Yes Reino Bellis B, NP  atorvastatin  (LIPITOR) 40 MG tablet TAKE 1 TABLET DAILY 08/07/19  Yes Burnell Blanks, MD  isosorbide mononitrate (IMDUR) 30 MG 24 hr tablet TAKE 1 TABLET TWICE A DAY (DOSE INCREASE) 07/02/19  Yes Burnell Blanks, MD  labetalol (NORMODYNE) 200 MG tablet Take 100 mg by mouth 2 (two) times daily.   Yes [provider]  losartan (COZAAR) 50 MG tablet Take 50 mg by mouth daily. 03/30/18  Yes [provider]  Multiple Vitamins-Minerals (PRESERVISION AREDS 2) CAPS Take 1 capsule by mouth 2 (two) times daily.    Yes [provider]  nitroGLYCERIN (NITROSTAT) 0.4 MG SL tablet Place 1 tablet (0.4 mg total) under the tongue every 5 (five) minutes  x 3 doses as needed for chest pain. 01/05/19  Yes Burnell Blanks, MD  ROCKLATAN 0.02-0.005 % SOLN INSTILL 1 DROP INTO THE RIGHT EYE QHS 04/10/19  Yes [provider]  HYDROcodone-acetaminophen (NORCO) 10-325 MG tablet Take 1 tablet by mouth every 6 (six) hours as needed. Patient not taking: Reported on 01/14/2020 07/08/18   Marchia Bond, MD    ROS:  Out of a complete 14 system review of symptoms, the patient complains only of the following symptoms, and all other reviewed systems are negative.  Numbness, weakness Mild balance problems  Blood pressure (!) 142/89, pulse (!) 56, temperature (!) 97.1 F (36.2 C), height 6\' 1"  (1.854 m), weight 220 lb (99.8 kg).  Physical Exam  General: The patient is alert and cooperative at the time of the examination.  Skin: No significant peripheral edema is noted.   Neurologic Exam  Mental status: The patient is alert and oriented x 3 at the time of the examination. The patient has apparent normal recent and remote memory, with an apparently normal attention span and concentration ability.   Cranial nerves: Facial symmetry is present. Speech is normal, no aphasia or dysarthria is noted. Extraocular movements are full. Visual fields are full.  Motor: The patient has good  strength in all 4 extremities, with exception of bilateral foot drops.  Sensory examination: Soft touch sensation is symmetric on the face, arms, and legs.  The patient has a stocking pattern pinprick sensory deficit across the ankle on the right leg and midway up the calf on the left leg.  Coordination: The patient has good finger-nose-finger and heel-to-shin bilaterally.  Gait and station: The patient has a wide-based gait, he is able to walk without assistance.  Tandem gait is unsteady.  Romberg is negative but is slightly unsteady.  Reflexes: Deep tendon reflexes are symmetric, but are depressed.   Assessment/Plan:  1.  Peripheral neuropathy  2.  Mild gait disturbance  3.  Bilateral foot drop  4.  History of MGUS  The patient has borderline diabetes and a history of a monoclonal antibody, both of these issues may be at work to create a peripheral neuropathy.  The patient does have bilateral foot drops, he is at risk for gait instability.  He does not wish to go on medications for the discomfort, we will recheck blood work to look for any other treatable sources of his neuropathy.  He will otherwise follow-up in 6 months.  Jill Alexanders MD 01/14/2020 7:31 AM  [facility] [address] [place], [place] [address]  Phone [phone] Fax [phone]

## 2020-01-18 ENCOUNTER — Telehealth: Payer: Self-pay | Admitting: Neurology

## 2020-01-18 MED ORDER — APIXABAN 5 MG PO TABS: 5.0000 mg | ORAL_TABLET | Freq: Two times a day (BID) | ORAL | 1 refills | Status: DC

## 2020-01-18 NOTE — Addendum Note (Signed)
Addended by: Kathrynn Ducking on: 01/18/2020 06:05 PM   Modules accepted: Orders

## 2020-01-18 NOTE — Telephone Encounter (Signed)
Pt called stating that he received a call from the pharmacy saying his apixaban (ELIQUIS) 5 MG TABS tablet refill has been denied and he would like the RN to take a look into why this is not being refilled. Please advise.

## 2020-01-18 NOTE — Telephone Encounter (Signed)
Revised. 

## 2020-01-18 NOTE — Telephone Encounter (Addendum)
I called pt about his eliquis being denied. I stated it was denied on 01/12/2020 by another nurse to be discuss at last visit. Pt stated he has atrial fibrillation and Dr. Eugenie Birks was managing it the eliquis.He has a cardiologist who is Dr. Angelena Form at [facility]. Pt has another month of eliquis left.I stated typically if pt has atrial fibrillation their cardiologist manage it ongoing. I stated a message will be sent to Dr. Jannifer Franklin if he wants the cardiologist to manage. Pt has seen cardiologist  In 07/2019. Pt appreciate the call and has another month of eliquis.

## 2020-01-18 NOTE — Telephone Encounter (Signed)
At some point, it may be appropriate for the cardiologist to take over management if they are seeing him for atrial fibrillation.  For now, I will go ahead and send in another prescription.

## 2020-01-18 NOTE — Telephone Encounter (Signed)
Dr.Willis do you want cardiology to take over managing his elquis for the atrial fibrillation? I can send message to provider at heart care. Let me know thanks Pt has another month left of medication.

## 2020-01-19 LAB — MULTIPLE MYELOMA PANEL, SERUM
Albumin SerPl Elph-Mcnc: 4.1 g/dL (ref 2.9–4.4)
Albumin/Glob SerPl: 1.8 — ABNORMAL HIGH (ref 0.7–1.7)
Alpha 1: 0.2 g/dL (ref 0.0–0.4)
Alpha2 Glob SerPl Elph-Mcnc: 0.7 g/dL (ref 0.4–1.0)
B-Globulin SerPl Elph-Mcnc: 0.8 g/dL (ref 0.7–1.3)
Gamma Glob SerPl Elph-Mcnc: 0.7 g/dL (ref 0.4–1.8)
Globulin, Total: 2.4 g/dL (ref 2.2–3.9)
IgA/Immunoglobulin A, Serum: 67 mg/dL (ref 61–437)
IgG (Immunoglobin G), Serum: 757 mg/dL (ref 603–1613)
IgM (Immunoglobulin M), Srm: 71 mg/dL (ref 15–143)
Total Protein: 6.5 g/dL (ref 6.0–8.5)

## 2020-01-19 LAB — SEDIMENTATION RATE: Sed Rate: 2 mm/hr (ref 0–30)

## 2020-01-19 LAB — ENA+DNA/DS+SJORGEN'S
ENA RNP Ab: 2.9 AI — ABNORMAL HIGH (ref 0.0–0.9)
ENA SM Ab Ser-aCnc: <0.2 AI (ref 0.0–0.9)
ENA SSA (RO) Ab: <0.2 AI (ref 0.0–0.9)
ENA SSB (LA) Ab: <0.2 AI (ref 0.0–0.9)
dsDNA Ab: <1 IU/mL (ref 0–9)

## 2020-01-19 LAB — ANA W/REFLEX: Anti Nuclear Antibody (ANA): POSITIVE — AB

## 2020-01-19 LAB — VITAMIN B12: Vitamin B-12: 521 pg/mL (ref 232–1245)

## 2020-01-19 LAB — ANGIOTENSIN CONVERTING ENZYME: Angio Convert Enzyme: 20 U/L (ref 14–82)

## 2020-01-19 LAB — B. BURGDORFI ANTIBODIES: Lyme IgG/IgM Ab: <0.91 {ISR} (ref 0.00–0.90)

## 2020-01-21 ENCOUNTER — Other Ambulatory Visit: Payer: Self-pay

## 2020-01-21 MED ORDER — APIXABAN 5 MG PO TABS: 5.0000 mg | ORAL_TABLET | Freq: Two times a day (BID) | ORAL | 1 refills | Status: DC

## 2020-01-21 NOTE — Telephone Encounter (Signed)
I call [facility] about needing clarification on the eliquis.They stated the pharmacy team does not have any questions now. They have the rx and will call back if they any concerns or questions.

## 2020-01-21 NOTE — Telephone Encounter (Signed)
Pt called stating that [facility] is needing information on the pt for them to be able to give him his apixaban (ELIQUIS) 5 MG TABS tablet The number he gave to call is [phone] Please advise.

## 2020-01-25 MED FILL — TOBRAMYCIN 0.3 % SOLN: 0.3 | 25 days supply | Qty: 5 | Fill #0

## 2020-01-25 MED FILL — METHYLPREDNISOLONE 4 MG TBP: 4 | 6 days supply | Qty: 21 | Fill #0

## 2020-02-08 ENCOUNTER — Other Ambulatory Visit: Payer: Self-pay | Admitting: Cardiovascular Disease

## 2020-03-03 MED FILL — PREDNISOLONE AC 1% EYE DROP: 1 | 5 days supply | Qty: 5 | Fill #0

## 2020-03-26 ENCOUNTER — Encounter (HOSPITAL_BASED_OUTPATIENT_CLINIC_OR_DEPARTMENT_OTHER): Payer: Self-pay | Admitting: Emergency Medicine

## 2020-03-26 ENCOUNTER — Emergency Department (HOSPITAL_BASED_OUTPATIENT_CLINIC_OR_DEPARTMENT_OTHER)
Admission: EM | Admit: 2020-03-26 | Discharge: 2020-03-26 | Disposition: A | Discharge status: Home / Self Care | Payer: Medicare Other | Attending: Emergency Medicine | Admitting: Emergency Medicine

## 2020-03-26 ENCOUNTER — Emergency Department (HOSPITAL_BASED_OUTPATIENT_CLINIC_OR_DEPARTMENT_OTHER): Payer: Medicare Other

## 2020-03-26 DIAGNOSIS — M546 Pain in thoracic spine: Secondary | ICD-10-CM | POA: Diagnosis not present

## 2020-03-26 DIAGNOSIS — Z7901 Long term (current) use of anticoagulants: Secondary | ICD-10-CM | POA: Insufficient documentation

## 2020-03-26 DIAGNOSIS — I2510 Atherosclerotic heart disease of native coronary artery without angina pectoris: Secondary | ICD-10-CM | POA: Insufficient documentation

## 2020-03-26 DIAGNOSIS — I480 Paroxysmal atrial fibrillation: Secondary | ICD-10-CM | POA: Insufficient documentation

## 2020-03-26 DIAGNOSIS — M542 Cervicalgia: Secondary | ICD-10-CM | POA: Diagnosis not present

## 2020-03-26 DIAGNOSIS — Z7982 Long term (current) use of aspirin: Secondary | ICD-10-CM | POA: Diagnosis not present

## 2020-03-26 DIAGNOSIS — I10 Essential (primary) hypertension: Secondary | ICD-10-CM | POA: Insufficient documentation

## 2020-03-26 DIAGNOSIS — G40909 Epilepsy, unspecified, not intractable, without status epilepticus: Secondary | ICD-10-CM | POA: Diagnosis not present

## 2020-03-26 DIAGNOSIS — Z87891 Personal history of nicotine dependence: Secondary | ICD-10-CM | POA: Diagnosis not present

## 2020-03-26 DIAGNOSIS — W19XXXA Unspecified fall, initial encounter: Secondary | ICD-10-CM

## 2020-03-26 DIAGNOSIS — Z79899 Other long term (current) drug therapy: Secondary | ICD-10-CM | POA: Insufficient documentation

## 2020-03-26 DIAGNOSIS — Z888 Allergy status to other drugs, medicaments and biological substances status: Secondary | ICD-10-CM | POA: Diagnosis not present

## 2020-03-26 DIAGNOSIS — E785 Hyperlipidemia, unspecified: Secondary | ICD-10-CM | POA: Insufficient documentation

## 2020-03-26 DIAGNOSIS — R519 Headache, unspecified: Secondary | ICD-10-CM | POA: Diagnosis not present

## 2020-03-26 DIAGNOSIS — S0990XA Unspecified injury of head, initial encounter: Secondary | ICD-10-CM

## 2020-03-26 MED ORDER — TRAMADOL HCL 50 MG PO TABS: 50.0000 mg | ORAL_TABLET | Freq: Four times a day (QID) | ORAL | 0 refills | Status: DC | PRN

## 2020-03-26 NOTE — ED Notes (Signed)
Patient transported to radiology

## 2020-03-26 NOTE — ED Notes (Signed)
ED Provider Johnney Killian, MD at bedside.

## 2020-03-26 NOTE — ED Provider Notes (Signed)
[place] EMERGENCY DEPARTMENT Provider Note   CSN: [id number] Arrival date & time: 03/26/20  1231     History Chief Complaint  Patient presents with  . Fall    Jose [name]. is a 84 y.o. male.  Patient with history of anticoagulation for atrial fibrillation and TIA --presents to the emergency department today approximately 1 hour after a fall.  Patient states that he bent over to dispose of lizards into a storm drain.  He stood up and stepped backward and stumbled, falling onto his middle back and striking the back of his head on the ground.  He denies loss of consciousness.  He has pain and tightness in his neck and middle back.  He denies any significant headaches.  He has had no confusion or vomiting.  He was concerned about hitting his head on a blood thinner.  He is also concerned about previous spinal surgeries and having exacerbated some of the chronic problems he has with his back and neck.  No treatments prior to arrival.  He denies chest pain, abdominal pain, lower back pain, pain in the arms or legs.        Past Medical History:  Diagnosis Date  . Arthritis   . CAD (coronary artery disease)    04/15/18 65% dLAD, 20% pLAD, 30% 2nd diag, Normal EF  . Dysrhythmia    afib  . Gallstones 07/1996  . Headache    couple times a year  . History of CT scan of head 1997   small vessel disease  . History of ETT 05/2002    no EKG changes  . History of MRI of cervical spine 07/15/2002  . Hypertension   . Lumbar spondylolysis 12/17/2016  . MGUS (monoclonal gammopathy of unknown significance) 04/11/2012  . Occipital neuralgia of left side 12/09/2018  . Peripheral neuropathy   . Pneumonia 10/2014  . PONV (postoperative nausea and vomiting)    once many years ago  . Primary localized osteoarthritis of left hip 07/08/2018  . Primary localized osteoarthrosis of left shoulder 12/25/2016  . Primary localized osteoarthrosis of right shoulder 11/05/2017  . Rectal bleeding  12/2002   colonoscopy  . Seizures ([place])    last seizure 1976  . Skin cancer   . Sleep apnea    uses CPAP  . Spinal stenosis, lumbar region with neurogenic claudication   . Stroke [facility])    ?tia ?afib  . TIA (transient ischemic attack)   . 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 extremity 03/29/2015    Patient Active Problem List   Diagnosis Date Noted  . Occipital neuralgia of left side 12/09/2018  . Primary localized osteoarthritis of left hip 07/08/2018  . Primary localized osteoarthritis of hip 07/08/2018  . CAD (coronary artery disease) 04/16/2018  . Chest pain   . Abnormal cardiovascular stress test 04/14/2018  . Primary localized osteoarthrosis of right shoulder 11/05/2017  . Primary localized osteoarthrosis of shoulder 11/05/2017  . Lumbar stenosis with neurogenic claudication 07/19/2017  . Primary localized osteoarthrosis of left shoulder 12/25/2016  . S/P shoulder replacement 12/25/2016  . Lumbar spondylolysis 12/17/2016  . Paresthesia 01/24/2016  . 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 extremity 03/29/2015  . Cervical spinal stenosis 02/04/2015  . Bronchiectasis without acute exacerbation ([place]) 01/11/2015  . Ulnar neuropathy at elbow 12/24/2014  . TIA (transient ischemic attack) 12/15/2014  . Hyperlipidemia 11/24/2014  . Hypoxia   . PAF (paroxysmal  atrial fibrillation) ([place])   . MGUS (monoclonal gammopathy of unknown significance) 04/11/2012  . SEBORRHEIC KERATOSIS 11/09/2010  . CPK, ABNORMAL 09/03/2010  . DERMATOPHYTOSIS OF FOOT 08/03/2010  . MYALGIA 12/27/2009  . GOUT, UNSPECIFIED 10/04/2009  . HYPERTROPHY PROSTATE W/O UR OBST & OTH LUTS 12/14/2008  . ALLERGIC CONJUNCTIVITIS 12/03/2007  . TESTOSTERONE DEFICIENCY 11/14/2007  . Backache 04/22/2007  . HYPERLIPIDEMIA 12/26/2006  . OBESITY, NOS 12/26/2006  . 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 Peripheral neuropathy 12/26/2006  . HEARING LOSS NOS OR DEAFNESS  12/26/2006  . HYPERTENSION, BENIGN SYSTEMIC 12/26/2006  . OSTEOARTHRITIS, MULTI SITES 12/26/2006  . Musculoskeletal disorder and symptoms referable to neck 12/26/2006    Past Surgical History:  Procedure Laterality Date  . ANTERIOR CERVICAL DECOMP/DISCECTOMY FUSION N/A 02/04/2015   Procedure: ANTERIOR CERVICAL DECOMPRESSION/DISCECTOMY FUSION CERVICAL FIVE-SIX,CERVICAL SIX-SEVEN;  Surgeon: Karie Chimera, MD;  Location: [place] [facility];  Service: Neurosurgery;  Laterality: N/A;  . BACK SURGERY    . CATARACT EXTRACTION Bilateral   . COLONOSCOPY W/ POLYPECTOMY  04/19/2003   tubular adenoma  . EYE SURGERY     Bilateral Cataracts  . GREAT TOE ARTHRODESIS, INTERPHALANGEAL JOINT Left 1978  . JOINT REPLACEMENT    . joint replacement on right thumb Right   . LEFT HEART CATH AND CORONARY ANGIOGRAPHY N/A 04/15/2018   Procedure: LEFT HEART CATH AND CORONARY ANGIOGRAPHY;  Surgeon: Troy Sine, MD;  Location: [facility];  Service: Cardiovascular;  Laterality: N/A;  . LUMBAR LAMINECTOMY/DECOMPRESSION MICRODISCECTOMY N/A 07/19/2017   Procedure: LAMINECTOMY AND FORAMINOTOMY  LUMBAR TWO- LUMBAR THREE, LUMBAR THREE- LUMBAR FOUR;  Surgeon: Ashok Pall, MD;  Location: [place];  Service: Neurosurgery;  Laterality: N/A;  . nerve transfer surgery to lwft hand Left    Nov. 2017  . SHOULDER ARTHROSCOPY W/ ROTATOR CUFF REPAIR Left 13   rotator cuff   . SHOULDER INJECTION  June 2012   left, [place]  . SKIN CANCER EXCISION  07/1993   left lateral arm  . SUPERFICIAL KERATECTOMY Right 05/1998   wrist  . thumb injection  June 2012   left, [place]  . TONSILLECTOMY AND ADENOIDECTOMY    . TOTAL HIP ARTHROPLASTY Left 07/08/2018  . TOTAL HIP ARTHROPLASTY Left 07/08/2018   Procedure: LEFT TOTAL HIP ARTHROPLASTY;  Surgeon: Marchia Bond, MD;  Location: [place];  Service: Orthopedics;  Laterality: Left;  . TOTAL SHOULDER ARTHROPLASTY Left 12/25/2016   Procedure: TOTAL SHOULDER ARTHROPLASTY;  Surgeon: Marchia Bond, MD;   Location: [place];  Service: Orthopedics;  Laterality: Left;  . TOTAL SHOULDER ARTHROPLASTY Right 11/05/2017  . TOTAL SHOULDER ARTHROPLASTY Right 11/05/2017   Procedure: TOTAL SHOULDER ARTHROPLASTY;  Surgeon: Marchia Bond, MD;  Location: [place];  Service: Orthopedics;  Laterality: Right;  . ulnar nerve transfer  12/1999   left  . ULNAR NERVE TRANSPOSITION Left        Family History  Problem Relation Age of Onset  . Dementia Mother 60       vascular  . Transient ischemic attack Mother   . Dementia Father        after hip fracture  . Hypertension Sister        skin cancer  . Seizures Neg Hx     Social History   Tobacco Use  . Smoking status: Former Smoker    Packs/day: 2.00    Years: 13.00    Pack years: 26.00    Types: Cigarettes    Quit date: 03/05/1968    Years since quitting: 52.0  .  Smokeless tobacco: Never Used  Substance Use Topics  . Alcohol use: Yes    Comment: social  . Drug use: No    Home Medications Prior to Admission medications   Medication Sig Start Date End Date Taking? Authorizing Provider  amLODipine (NORVASC) 5 MG tablet TAKE 1 TABLET DAILY 06/08/19   [provider]  apixaban (ELIQUIS) 5 MG TABS tablet Take 1 tablet (5 mg total) by mouth 2 (two) times daily. 01/21/20   Kathrynn Ducking, MD  aspirin EC 81 MG EC tablet Take 1 tablet (81 mg total) by mouth daily. 04/17/18   Cheryln Manly, NP  atorvastatin (LIPITOR) 40 MG tablet TAKE 1 TABLET DAILY 08/07/19   Burnell Blanks, MD  HYDROcodone-acetaminophen (NORCO) 10-325 MG tablet Take 1 tablet by mouth every 6 (six) hours as needed. Patient not taking: Reported on 01/14/2020 07/08/18   Marchia Bond, MD  isosorbide mononitrate (IMDUR) 30 MG 24 hr tablet TAKE 1 TABLET TWICE A DAY (DOSE INCREASE) 02/09/20   Burnell Blanks, MD  labetalol (NORMODYNE) 200 MG tablet Take 100 mg by mouth 2 (two) times daily.    [provider]  losartan (COZAAR) 50 MG tablet Take 50 mg by mouth  daily. 03/30/18   [provider]  Multiple Vitamins-Minerals (PRESERVISION AREDS 2) CAPS Take 1 capsule by mouth 2 (two) times daily.     [provider]  nitroGLYCERIN (NITROSTAT) 0.4 MG SL tablet Place 1 tablet (0.4 mg total) under the tongue every 5 (five) minutes x 3 doses as needed for chest pain. 01/05/19   Burnell Blanks, MD  ROCKLATAN 0.02-0.005 % SOLN INSTILL 1 DROP INTO THE RIGHT EYE QHS 04/10/19   [provider]    Allergies    Maprotiline and Tetracycline hcl  Review of Systems   Review of Systems  Constitutional: Negative for fatigue.  HENT: Negative for tinnitus.   Eyes: Negative for photophobia, pain and visual disturbance.  Respiratory: Negative for shortness of breath.   Cardiovascular: Negative for chest pain.  Gastrointestinal: Negative for nausea and vomiting.  Musculoskeletal: Positive for back pain and neck pain. Negative for gait problem.  Skin: Negative for wound.  Neurological: Negative for dizziness, weakness, light-headedness, numbness and headaches.  Psychiatric/Behavioral: Negative for confusion and decreased concentration.    Physical Exam Updated Vital Signs BP 112/80 (BP Location: Left Arm)   Pulse 60   Temp 98.2 F (36.8 C) (Oral)   Resp 16   Ht 6\' 1"  (1.854 m)   Wt 97.5 kg   SpO2 98%   BMI 28.37 kg/m   Physical Exam Vitals and nursing note reviewed.  Constitutional:      Appearance: He is well-developed.  HENT:     Head: Normocephalic and atraumatic. No raccoon eyes or Battle's sign.     Right Ear: Tympanic membrane, ear canal and external ear normal. No hemotympanum.     Left Ear: Tympanic membrane, ear canal and external ear normal. No hemotympanum.     Nose: Nose normal.  Eyes:     General: Lids are normal.     Conjunctiva/sclera: Conjunctivae normal.     Pupils: Pupils are equal, round, and reactive to light.     Comments: No visible hyphema  Cardiovascular:     Rate and Rhythm: Normal rate and  regular rhythm.  Pulmonary:     Effort: Pulmonary effort is normal.     Breath sounds: Normal breath sounds.  Abdominal:     Palpations: Abdomen is  soft.     Tenderness: There is no abdominal tenderness.  Musculoskeletal:        General: Normal range of motion.     Cervical back: Normal range of motion and neck supple. Tenderness present. No bony tenderness.     Thoracic back: Tenderness (upper) present. No bony tenderness.     Lumbar back: No tenderness or bony tenderness.     Comments: Patient is able to raise his arms above his head without any difficulty.  He denies any hip pain or lower extremity injury.  Skin:    General: Skin is warm and dry.  Neurological:     Mental Status: He is alert and oriented to person, place, and time.     GCS: GCS eye subscore is 4. GCS verbal subscore is 5. GCS motor subscore is 6.     Cranial Nerves: No cranial nerve deficit.     Sensory: No sensory deficit.     Coordination: Coordination normal.     Deep Tendon Reflexes: Reflexes are normal and symmetric.     ED Results / Procedures / Treatments   Labs (all labs ordered are listed, but only abnormal results are displayed) Labs Reviewed - No data to display  EKG None  Radiology DG Thoracic Spine W/Swimmers  Result Date: 03/26/2020 CLINICAL DATA:  Slipped and fell backwards on the cement.  Pain. EXAM: THORACIC SPINE - 3 VIEWS COMPARISON:  None. FINDINGS: There is no evidence of thoracic spine fracture. Alignment is normal. No other significant bone abnormalities are identified. IMPRESSION: Negative. Electronically Signed   By: Dorise Bullion III M.D   On: 03/26/2020 13:48   CT Head Wo Contrast  Result Date: 03/26/2020 CLINICAL DATA:  84 year old male with acute head and neck injury following fall today. Initial encounter. EXAM: CT HEAD WITHOUT CONTRAST CT CERVICAL SPINE WITHOUT CONTRAST TECHNIQUE: Multidetector CT imaging of the head and cervical spine was performed following the standard  protocol without intravenous contrast. Multiplanar CT image reconstructions of the cervical spine were also generated. COMPARISON:  02/22/2016 head CT, MR, 03/14/2020 cervical spine radiographs, 01/24/2015 cervical spine CT and prior studies. FINDINGS: CT HEAD FINDINGS Brain: No evidence of acute infarction, hemorrhage, hydrocephalus, extra-axial collection or mass lesion/mass effect. Mild atrophy and chronic small-vessel white matter ischemic changes again noted. Vascular: Carotid atherosclerotic calcifications are noted. Skull: Normal. Negative for fracture or focal lesion. Sinuses/Orbits: No acute finding. Other: None CT CERVICAL SPINE FINDINGS Alignment: Normal. Skull base and vertebrae: No acute fracture. No primary bone lesion or focal pathologic process. Soft tissues and spinal canal: No prevertebral fluid or swelling. No visible canal hematoma. Disc levels: Anterior/interbody fusion changes at C5-6 and C6-7 noted. Mild degenerative disc disease and moderate facet arthropathy again noted at multiple levels. These findings are again noted to contribute to some degree of central spinal and bony foraminal narrowing. Upper chest: No acute abnormality Other: None IMPRESSION: 1. No evidence of acute intracranial abnormality. Mild atrophy and chronic small-vessel white matter ischemic changes. 2. No static evidence of acute injury to the cervical spine. Electronically Signed   By: Margarette Canada M.D.   On: 03/26/2020 13:48   CT Cervical Spine Wo Contrast  Result Date: 03/26/2020 CLINICAL DATA:  84 year old male with acute head and neck injury following fall today. Initial encounter. EXAM: CT HEAD WITHOUT CONTRAST CT CERVICAL SPINE WITHOUT CONTRAST TECHNIQUE: Multidetector CT imaging of the head and cervical spine was performed following the standard protocol without intravenous contrast. Multiplanar CT image reconstructions of the  cervical spine were also generated. COMPARISON:  02/22/2016 head CT, MR, 03/14/2020  cervical spine radiographs, 01/24/2015 cervical spine CT and prior studies. FINDINGS: CT HEAD FINDINGS Brain: No evidence of acute infarction, hemorrhage, hydrocephalus, extra-axial collection or mass lesion/mass effect. Mild atrophy and chronic small-vessel white matter ischemic changes again noted. Vascular: Carotid atherosclerotic calcifications are noted. Skull: Normal. Negative for fracture or focal lesion. Sinuses/Orbits: No acute finding. Other: None CT CERVICAL SPINE FINDINGS Alignment: Normal. Skull base and vertebrae: No acute fracture. No primary bone lesion or focal pathologic process. Soft tissues and spinal canal: No prevertebral fluid or swelling. No visible canal hematoma. Disc levels: Anterior/interbody fusion changes at C5-6 and C6-7 noted. Mild degenerative disc disease and moderate facet arthropathy again noted at multiple levels. These findings are again noted to contribute to some degree of central spinal and bony foraminal narrowing. Upper chest: No acute abnormality Other: None IMPRESSION: 1. No evidence of acute intracranial abnormality. Mild atrophy and chronic small-vessel white matter ischemic changes. 2. No static evidence of acute injury to the cervical spine. Electronically Signed   By: Margarette Canada M.D.   On: 03/26/2020 13:48    Procedures Procedures (including critical care time)  Medications Ordered in ED Medications - No data to display  ED Course  I have reviewed the triage vital signs and the nursing notes.  Pertinent labs & imaging results that were available during my care of the patient were reviewed by me and considered in my medical decision making (see chart for details).  Patient seen and examined. Work-up initiated.  Patient looks well at time of exam.  CT of the head, cervical spine and x-ray of the thoracic spine ordered.  He does not have any apparent head trauma.   Vital signs reviewed and are as follows: BP 112/80 (BP Location: Left Arm)   Pulse 60    Temp 98.2 F (36.8 C) (Oral)   Resp 16   Ht 6\' 1"  (1.854 m)   Wt 97.5 kg   SpO2 98%   BMI 28.37 kg/m   2:14 PM imaging is negative.  Patient discussed with and seen by Dr. Johnney Killian.  Plan for d/c to home with conservative measures. He will follow-up with his neurosurgeon as needed.   Tramadol sent in to patient's pharmacy.     MDM Rules/Calculators/A&P                      Patient with mechanical fall just prior to arrival.  Exam is reassuring.  Imaging without signs of acute injury.  Suspect musculoskeletal injury.  Moves upper and lower extremities well.   Final Clinical Impression(s) / ED Diagnoses Final diagnoses:  Fall, initial encounter  Minor head injury, initial encounter    Rx / DC Orders ED Discharge Orders         Ordered    traMADol (ULTRAM) 50 MG tablet  Every 6 hours PRN     03/26/20 1417           Carlisle Cater, PA-C 03/26/20 1418    Charlesetta Shanks, MD 03/26/20 1419

## 2020-03-26 NOTE — ED Notes (Signed)
Heart rate noted to be high 40s to  low 50s,  pt denies dizziness, dizziness at time of fall

## 2020-03-26 NOTE — ED Provider Notes (Signed)
Medical screening examination/treatment/procedure(s) were conducted as a shared visit with non-physician practitioner(s) and myself.  I personally evaluated the patient during the encounter.    Patient is on anticoagulants for atrial fibrillation.  He fell backwards by stumbling and hit milled his back and his head on the ground.  No loss of consciousness.  Patient ports he does have some pain on the right side of his neck but no sensation of numbness tingling or weakness of the arm.  No confusion or vomiting.  Patient is alert and appropriate.  Movements are coordinated purposeful symmetric.  He does have a focus of tenderness in the paraspinous muscle bodies on the right.  No respiratory distress.   Agree with plan of management.      Charlesetta Shanks, MD 03/26/20 1419

## 2020-03-26 NOTE — Discharge Instructions (Signed)
Please read and follow all provided instructions.  Your diagnoses today include:  1. Fall, initial encounter   2. Fall   3. Minor head injury, initial encounter     Tests performed today include:  CT scan of your head and cervical spine that did not show any serious injury.  X-ray of your middle back - no acute injury  Vital signs. See below for your results today.   Medications prescribed:   None  Take any prescribed medications only as directed.  Home care instructions:  Follow any educational materials contained in this packet.  BE VERY CAREFUL not to take multiple medicines containing Tylenol (also called acetaminophen). Doing so can lead to an overdose which can damage your liver and cause liver failure and possibly death.   Follow-up instructions: Please follow-up with your primary care provider in the next 3 days for further evaluation of your symptoms if not resolved.   Return instructions:  SEEK IMMEDIATE MEDICAL ATTENTION IF:  There is confusion or drowsiness (although children frequently become drowsy after injury).   You cannot awaken the injured person.   You have more than one episode of vomiting.   You notice dizziness or unsteadiness which is getting worse, or inability to walk.   You have convulsions or unconsciousness.   You experience severe, persistent headaches not relieved by Tylenol.  You cannot use arms or legs normally.   There are changes in pupil sizes. (This is the black center in the colored part of the eye)   There is clear or bloody discharge from the nose or ears.   You have change in speech, vision, swallowing, or understanding.   Localized weakness, numbness, tingling, or change in bowel or bladder control.  You have any other emergent concerns.  Additional Information: You have had a head injury which does not appear to require admission at this time.  Your vital signs today were: BP 133/77 (BP Location: Right Arm)    Pulse (!) 58   Temp 98.2 F (36.8 C) (Oral)   Resp 14   Ht 6\' 1"  (1.854 m)   Wt 97.5 kg   SpO2 97%   BMI 28.37 kg/m  If your blood pressure (BP) was elevated above 135/85 this visit, please have this repeated by your doctor within one month. --------------

## 2020-03-26 NOTE — ED Triage Notes (Signed)
He lost his balance and fell backwards today striking his head on cement. Denies LOC. He takes Eliquis. C/o head, neck, and back pain.

## 2020-04-04 ENCOUNTER — Telehealth: Payer: Self-pay | Admitting: Cardiovascular Disease

## 2020-04-04 NOTE — Telephone Encounter (Signed)
Patient is calling to make Dr. Angelena Form aware that he has a procedure scheduled for 03/25/20. He states the office requesting clearance will be contacting our office.

## 2020-04-05 NOTE — Telephone Encounter (Signed)
   [facility] Pre-operative Risk Assessment    HEARTCARE STAFF: - Please ensure there is not already an duplicate clearance open for this procedure. - Under Visit Info/Reason for Call, type in Other and utilize the format Clearance MM/DD/YY or Clearance TBD. Do not use dashes or single digits. - If request is for dental extraction, please clarify the # of teeth to be extracted.  Request for surgical clearance:  1. What type of surgery is being performed? C3-4 Left Facet NRB   2. When is this surgery scheduled? 04/25/20   3. What type of clearance is required (medical clearance vs. Pharmacy clearance to hold med vs. Both)? Pharmacy  4. Are there any medications that need to be held prior to surgery and how long? Eliquis for 3 days prior to procedure   5. Practice name and name of physician performing surgery? [facility]; Dr. Clydell Hakim   6. What is the office phone number? [phone]   7.   What is the office fax number? [phone]  8.   Anesthesia type (None, local, MAC, general) ?    Cleon Gustin 04/05/2020, 6:19 PM  _________________________________________________________________   (provider comments below)

## 2020-04-05 NOTE — Telephone Encounter (Signed)
Thanks, Can we be on the lookout for this Jose [name]? [name]

## 2020-04-06 NOTE — Telephone Encounter (Signed)
   Primary Cardiologist:Christopher Angelena Form, MD  Chart reviewed as part of pre-operative protocol coverage. Patient has not been seen since 07/2019. Because of CLAYDEN WITHEM Jr.'s past medical history and time since last visit, they will require a follow-up visit in order to better assess preoperative cardiovascular risk.  Pre-op covering staff: - Please schedule appointment and call patient to inform them. If patient already had an upcoming appointment within acceptable timeframe, please add "pre-op clearance" to the appointment notes so provider is aware. - Please contact requesting surgeon's office via preferred method (i.e, phone, fax) to inform them of need for appointment prior to surgery.  I will also route this message to Pharmacy pool for input on holding anticoagulant as requested below so that this information is available to the clearing provider at time of patient's appointment.   Darreld Mclean, PA-C  04/06/2020, 9:18 AM

## 2020-04-06 NOTE — Telephone Encounter (Signed)
Pt has been scheduled to see Jory Sims, DNP 04/20/20 for pre op clearance. Pt has procedure 04/25/20. Dr. Camillia Herter nurse did offer 04/27/20 2 pm appt with Dr. Angelena Form though this would delay pt's procedure. Pt would like to have sooner appt as procedure is injection to help with pain in his neck. Pt is agreeable to see DNP at [place] office 04/20/20 @ 10:15. I will forward notes to DNP for upcoming appt. I will remove from the pre op call back pool.

## 2020-04-08 ENCOUNTER — Telehealth: Payer: Self-pay | Admitting: Neurology

## 2020-04-08 DIAGNOSIS — G4733 Obstructive sleep apnea (adult) (pediatric): Secondary | ICD-10-CM

## 2020-04-08 NOTE — Telephone Encounter (Signed)
Pt called stating that [facility] [employer] has sent in requests for a prescription for the pt to be able to get filters for his cpap. Pt is needing this prescription sent to them so that he is able to use his cpap correctly. Please advise.

## 2020-04-12 MED FILL — AMOXICILLIN 500 MG CAPSULE: 500 | 2 days supply | Qty: 8 | Fill #0

## 2020-04-12 NOTE — Telephone Encounter (Signed)
Supply reorder has been sent to [facility]

## 2020-04-19 NOTE — Progress Notes (Signed)
Cardiology Office Note   Date:  04/20/2020   ID:  [name], DOB 1936-07-25, MRN [id number]  PCP:  Karlene Einstein, MD  Cardiologist:  Dr. Angelena Form CC: Pre-Operative Evaluation     History of Present Illness: Jose [name]. is a 84 y.o. male who presents for cardiology preop evaluation in order to have C 3-4 left facet NRB, on 04/25/2020.  This is to be completed by [facility], Dr. Clydell Hakim.  Recommendations concerning holding Eliquis for 3 days prior to procedure is also requested.  Mr. Nickles has a history of PAF on Eliquis, history of TIA, hypertension, hyperlipidemia, (being followed by [facility]) and CAD which was noted on 03/2018 revealing moderate nonobstructive disease in the LAD at 65%, mild RCA disease.  Echocardiogram in June 2019 revealed normal LVEF of 60 to 65%.  When last seen in the office he was evaluated by Amie Portland, [place] on 08/06/2019.  At that time he denied any cardiac symptoms.  He is trying to stay active but he is having significant arthritis pain in his feet and a cystlike mass in his right neck for which he is being followed.  He comes today with mild increase in chest pressure.  Rating it from "2" to "4" over the last 6 months.  He states he is not very active.  The discomfort is described as a "achy feeling" which is not wax or wane, and is "there all the time."  Nothing intensifies or alleviates the pain.  He has not had to take nitroglycerin.  It is not impacting his daily life are causing him not to be able to complete ADLs.  Labs have been recently completed by his primary care provider Dr. Vista Lawman.  He reports that his labs were good and no changes in his medications were made based upon the results.  He sees him for annual follow-ups and saw him most recently within the last 3 months.  Past Medical History:  Diagnosis Date  . Arthritis   . CAD (coronary artery disease)    04/15/18 65% dLAD, 20% pLAD, 30% 2nd  diag, Normal EF  . Dysrhythmia    afib  . Gallstones 07/1996  . Headache    couple times a year  . History of CT scan of head 1997   small vessel disease  . History of ETT 05/2002    no EKG changes  . History of MRI of cervical spine 07/15/2002  . Hypertension   . Lumbar spondylolysis 12/17/2016  . MGUS (monoclonal gammopathy of unknown significance) 04/11/2012  . Occipital neuralgia of left side 12/09/2018  . Peripheral neuropathy   . Pneumonia 10/2014  . PONV (postoperative nausea and vomiting)    once many years ago  . Primary localized osteoarthritis of left hip 07/08/2018  . Primary localized osteoarthrosis of left shoulder 12/25/2016  . Primary localized osteoarthrosis of right shoulder 11/05/2017  . Rectal bleeding 12/2002   colonoscopy  . Seizures ([place])    last seizure 1976  . Skin cancer   . Sleep apnea    uses CPAP  . Spinal stenosis, lumbar region with neurogenic claudication   . Stroke [facility])    ?tia ?afib  . TIA (transient ischemic attack)   . 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 ANTERIOR CERVICAL DECOMP/DISCECTOMY FUSION N/A 02/04/2015  Procedure: ANTERIOR CERVICAL DECOMPRESSION/DISCECTOMY FUSION CERVICAL FIVE-SIX,CERVICAL SIX-SEVEN;  Surgeon: Karie Chimera, MD;  Location: [place] [facility];  Service: Neurosurgery;  Laterality: N/A;  . BACK SURGERY    . CATARACT EXTRACTION Bilateral   . COLONOSCOPY W/ POLYPECTOMY  04/19/2003   tubular adenoma  . EYE SURGERY     Bilateral Cataracts  . GREAT TOE ARTHRODESIS, INTERPHALANGEAL JOINT Left 1978  . JOINT REPLACEMENT    . joint replacement on right thumb Right   . LEFT HEART CATH AND CORONARY ANGIOGRAPHY N/A 04/15/2018   Procedure: LEFT HEART CATH AND CORONARY ANGIOGRAPHY;  Surgeon: Troy Sine, MD;  Location: [place] [facility];  Service: Cardiovascular;  Laterality: N/A;  . LUMBAR  LAMINECTOMY/DECOMPRESSION MICRODISCECTOMY N/A 07/19/2017   Procedure: LAMINECTOMY AND FORAMINOTOMY  LUMBAR TWO- LUMBAR THREE, LUMBAR THREE- LUMBAR FOUR;  Surgeon: Ashok Pall, MD;  Location: [place];  Service: Neurosurgery;  Laterality: N/A;  . nerve transfer surgery to lwft hand Left    Nov. 2017  . SHOULDER ARTHROSCOPY W/ ROTATOR CUFF REPAIR Left 13   rotator cuff   . SHOULDER INJECTION  June 2012   left, [place]  . SKIN CANCER EXCISION  07/1993   left lateral arm  . SUPERFICIAL KERATECTOMY Right 05/1998   wrist  . thumb injection  June 2012   left, [place]  . TONSILLECTOMY AND ADENOIDECTOMY    . TOTAL HIP ARTHROPLASTY Left 07/08/2018  . TOTAL HIP ARTHROPLASTY Left 07/08/2018   Procedure: LEFT TOTAL HIP ARTHROPLASTY;  Surgeon: Marchia Bond, MD;  Location: [place];  Service: Orthopedics;  Laterality: Left;  . TOTAL SHOULDER ARTHROPLASTY Left 12/25/2016   Procedure: TOTAL SHOULDER ARTHROPLASTY;  Surgeon: Marchia Bond, MD;  Location: [place];  Service: Orthopedics;  Laterality: Left;  . TOTAL SHOULDER ARTHROPLASTY Right 11/05/2017  . TOTAL SHOULDER ARTHROPLASTY Right 11/05/2017   Procedure: TOTAL SHOULDER ARTHROPLASTY;  Surgeon: Marchia Bond, MD;  Location: [place];  Service: Orthopedics;  Laterality: Right;  . ulnar nerve transfer  12/1999   left  . ULNAR NERVE TRANSPOSITION Left      Current Outpatient Medications  Medication Sig Dispense Refill  . amLODipine (NORVASC) 5 MG tablet TAKE 1 TABLET DAILY    . apixaban (ELIQUIS) 5 MG TABS tablet Take 1 tablet (5 mg total) by mouth 2 (two) times daily. 180 tablet 1  . aspirin EC 81 MG EC tablet Take 1 tablet (81 mg total) by mouth daily.    Marland Kitchen atorvastatin (LIPITOR) 40 MG tablet TAKE 1 TABLET DAILY 90 tablet 3  . isosorbide mononitrate (IMDUR) 30 MG 24 hr tablet TAKE 1 TABLET TWICE A DAY (DOSE INCREASE) 180 tablet 1  . labetalol (NORMODYNE) 200 MG tablet Take 100 mg by mouth 2 (two) times daily.    Marland Kitchen losartan (COZAAR) 50 MG tablet Take 1 tablet  by mouth daily.    . Multiple Vitamins-Minerals (PRESERVISION AREDS 2) CAPS Take 1 capsule by mouth 2 (two) times daily.     . nitroGLYCERIN (NITROSTAT) 0.4 MG SL tablet Place 1 tablet (0.4 mg total) under the tongue every 5 (five) minutes x 3 doses as needed for chest pain. 25 tablet 3  . prednisoLONE acetate (PRED FORTE) 1 % ophthalmic suspension Place 1 drop into the left eye 2 (two) times daily.    Marland Kitchen ROCKLATAN 0.02-0.005 % SOLN INSTILL 1 DROP INTO THE RIGHT EYE QHS    . traMADol (ULTRAM) 50 MG tablet Take 1 tablet (50 mg total) by mouth every 6 (six) hours as needed. 15  tablet 0   No current facility-administered medications for this visit.    Allergies:   Maprotiline and Tetracycline hcl    Social History:  The patient  reports that he quit smoking about 52 years ago. His smoking use included cigarettes. He has a 26.00 pack-year smoking history. He has never used smokeless tobacco. He reports current alcohol use. He reports that he does not use drugs.   Family History:  The patient's family history includes Dementia in his father; Dementia (age of onset: 67) in his mother; Hypertension in his sister; Transient ischemic attack in his mother.    ROS: All other systems are reviewed and negative. Unless otherwise mentioned in H&P    PHYSICAL EXAM: VS:  BP 130/82   Pulse 60   Temp (!) 97.3 F (36.3 C)   Ht 6\' 1"  (1.854 m)   Wt 217 lb 6.4 oz (98.6 kg)   SpO2 94%   BMI 28.68 kg/m  , BMI Body mass index is 28.68 kg/m. GEN: Well nourished, well developed, in no acute distress HEENT: normal Neck: no JVD, carotid bruits, or masses Cardiac: RRR; no murmurs, rubs, or gallops,no edema  Respiratory:  Clear to auscultation bilaterally, normal work of breathing GI: soft, nontender, nondistended, + BS MS: no deformity or atrophy Skin: warm and dry, no rash Neuro:  Strength and sensation are intact Psych: euthymic mood, full affect   EKG: Sinus rhythm with occasional PVCs, left axis  deviation is noted.  Mild LVH.  Ventricular rate of 60 bpm.  Recent Labs: 08/02/2019: BUN 18; Creatinine, Ser 0.84; Hemoglobin 12.8; Platelets 161; Potassium 3.5; Sodium 139    Lipid Panel    Component Value Date/Time   CHOL 85 (L) 08/04/2018 0838   TRIG 59 08/04/2018 0838   HDL 41 08/04/2018 0838   CHOLHDL 2.1 08/04/2018 0838   CHOLHDL 3.8 April 18, 2018 0351   VLDL 39 04/18/18 0351   LDLCALC 32 08/04/2018 0838   LDLDIRECT 120 (H) 08/03/2010 1719      Wt Readings from Last 3 Encounters:  04/20/20 217 lb 6.4 oz (98.6 kg)  03/26/20 215 lb (97.5 kg)  01/14/20 220 lb (99.8 kg)      Other studies Reviewed: Echocardiogram 04/18/18 Left ventricle: The cavity size was normal. Wall thickness was  increased in a pattern of moderate LVH. Systolic function was  normal. The estimated ejection fraction was in the range of 60%  to 65%. Wall motion was normal; there were no regional wall  motion abnormalities. Doppler parameters are consistent with  abnormal left ventricular relaxation (grade 1 diastolic  dysfunction).  - Aorta: Aortic root dimension: 40 mm (ED).  - Aortic root: The aortic root is mildly dilated.  - Mitral valve: Mildly thickened leaflets . There was trivial  regurgitation.  - Left atrium: The atrium was normal in size.  - Inferior vena cava: The vessel was normal in size. The  respirophasic diameter changes were in the normal range (>= 50%),  consistent with normal central venous pressure.   LHC 18-Apr-2018  Mid LAD-1 lesion is 30% stenosed.  Mid LAD-2 lesion is 65% stenosed.  The left ventricular systolic function is normal.  LV end diastolic pressure is normal.  Ost LAD to Prox LAD lesion is 20% stenosed.   Normal LV function with an ejection fraction of 55 to 60% without segmental wall motion abnormalities.  Single-vessel CAD with smooth 20% proximal LAD narrowing, smooth 30% narrowing immediately before and after the takeoff of the  second diagonal  vessel, and smooth 65% distal LAD stenosis with a small distal LAD; normal left circumflex system; and very large dominant RCA with minimal luminal irregularity without significant stenoses.  The vessel gives rise to a large PDA and PLA which supplies the entire inferior and posterior lateral wall extending to the apex.  RECOMMENDATION: Medical therapy.  Low-dose nitrate therapy will be initiated.  Continue aggressive lipid-lowering therapy with high potency statin and target LDL less than 70.   ASSESSMENT AND PLAN:  1.  Coronary artery disease: History of single-vessel disease of the LAD with most recent cardiac catheterization 04/15/2018 with mid LAD 1 lesion 30% stenosis mid LAD to lesion 65% stenosed.  He reports that he has had a change in the pressure in his chest characterization to an achy feeling which is there all the time.  He does not wax or wane, there is no intensifying or alleviating factors.  He has not had to take nitroglycerin sublingual.  Due to known history of CAD with LAD disease, will plan a cardiac CTA to evaluate further, and then have him follow-up with Dr. Angelena Form to discuss whether or not he needs repeat cardiac catheterization or simply increased dose of isosorbide.  2. Pre-Operative Cardiac Clearance: And this note goes to [facility] Dr. Maryjean Ka is not there yet but chart reviewed as part of pre-operative protocol coverage. Given past medical history and time since last visit, based on ACC/AHA guidelines, Lester Crickenberger. would be at acceptable risk for the planned procedure without further cardiovascular testing.   He is okay to stop Eliquis for 3 days prior to procedure.  This will begin on 04/22/2020, and he will restart this on 04/26/2020 as injection is scheduled for late in the afternoon of the 25th.  He remains in normal sinus rhythm at this time.  These instructions have been provided to the patient verbally and in writing.  3. PAF:  Heart rate is well controlled currently without AV nodal blocking agents.  He will continue apixaban with changes as above.  He remains in normal sinus rhythm per EKG today.  4.Hypertension: Blood pressures well controlled currently.  He will continue losartan, amlodipine.  And labetalol as directed.  5.  Hyperlipidemia: Continues on atorvastatin 40 mg daily.  Goal of LDL less than 70.  This is followed by his PCP.  Current medicines are reviewed at length with the patient today.  I have spent 35 minutes dedicated to the care of this patient on the date of this encounter to include pre-visit review of records, assessment, management and diagnostic testing,with shared decision making.  Labs/ tests ordered today include: Coronary CTA.  She thank you Phill Myron. West Pugh, ANP, [facility]   04/20/2020 10:37 AM    [facility] [place] [address] Office [phone] Fax [phone]  Notice: This dictation was prepared with Dragon dictation along with smaller phrase technology. Any transcriptional errors that result from this process are unintentional and may not be corrected upon review.

## 2020-04-20 ENCOUNTER — Ambulatory Visit (INDEPENDENT_AMBULATORY_CARE_PROVIDER_SITE_OTHER): Payer: Medicare Other | Admitting: Adult Health

## 2020-04-20 ENCOUNTER — Other Ambulatory Visit: Payer: Self-pay

## 2020-04-20 ENCOUNTER — Encounter: Payer: Self-pay | Admitting: Adult Health

## 2020-04-20 VITALS — BP 130/82 | HR 60 | Temp 97.3°F | Ht 73.0 in | Wt 217.4 lb

## 2020-04-20 DIAGNOSIS — I10 Essential (primary) hypertension: Secondary | ICD-10-CM | POA: Diagnosis not present

## 2020-04-20 DIAGNOSIS — I2510 Atherosclerotic heart disease of native coronary artery without angina pectoris: Secondary | ICD-10-CM | POA: Diagnosis not present

## 2020-04-20 DIAGNOSIS — M4802 Spinal stenosis, cervical region: Secondary | ICD-10-CM

## 2020-04-20 DIAGNOSIS — Z01810 Encounter for preprocedural cardiovascular examination: Secondary | ICD-10-CM

## 2020-04-20 DIAGNOSIS — I480 Paroxysmal atrial fibrillation: Secondary | ICD-10-CM | POA: Diagnosis not present

## 2020-04-20 DIAGNOSIS — E785 Hyperlipidemia, unspecified: Secondary | ICD-10-CM

## 2020-04-20 DIAGNOSIS — R0789 Other chest pain: Secondary | ICD-10-CM

## 2020-04-20 MED ORDER — METOPROLOL TARTRATE 50 MG PO TABS: 50.0000 mg | ORAL_TABLET | Freq: Once | ORAL | 0 refills | Status: DC

## 2020-04-20 NOTE — Patient Instructions (Addendum)
Medication Instructions:  HOLD- Eliquis 2 days prior to procedure  *If you need a refill on your cardiac medications before your next appointment, please call your pharmacy*   Lab Work: [facility]  If you have labs (blood work) drawn today and your tests are completely normal, you will receive your results only by: Marland Kitchen MyChart Message (if you have MyChart) OR . A paper copy in the mail If you have any lab test that is abnormal or we need to change your treatment, we will call you to review the results.   Testing/Procedures:CT Angiography (CTA), is a special type of CT scan that uses a computer to produce multi-dimensional views of major blood vessels throughout the body. In CT angiography, a contrast material is injected through an IV to help visualize the blood vessels    Follow-Up: At [facility], you and your health needs are our priority.  As part of our continuing mission to provide you with exceptional heart care, we have created designated Provider Care Teams.  These Care Teams include your primary Cardiologist (physician) and Advanced Practice Providers (APPs -  Physician Assistants and Nurse Practitioners) who all work together to provide you with the care you need, when you need it.  We recommend signing up for the patient portal called "MyChart".  Sign up information is provided on this After Visit Summary.  MyChart is used to connect with patients for Virtual Visits (Telemedicine).  Patients are able to view lab/test results, encounter notes, upcoming appointments, etc.  Non-urgent messages can be sent to your provider as well.   To learn more about what you can do with MyChart, go to [email or web address].    Your next appointment:   2 month(s)  The format for your next appointment:   In Person  Provider:   You may see Lauree Chandler, MD or one of the following Advanced Practice Providers on your designated Care Team:    Melina Copa, PA-C  Ermalinda Barrios,  PA-C    Other Instructions *Your cardiac CT will be scheduled at one of the below locations:   [facility] [address] [place], [place] [address] [phone]  [place] [address] [place], [place] [address] [phone]  If scheduled at [facility], please arrive at the [facility] main entrance of [facility] 30 minutes prior to test start time. Proceed to the [facility] Radiology Department (first floor) to check-in and test prep.  If scheduled at [facility], please arrive 15 mins early for check-in and test prep.  Please follow these instructions carefully (unless otherwise directed):  Hold all erectile dysfunction medications at least 3 days (72 hrs) prior to test.  On the Night Before the Test: . Be sure to Drink plenty of water. . Do not consume any caffeinated/decaffeinated beverages or chocolate 12 hours prior to your test. . Do not take any antihistamines 12 hours prior to your test.   On the Day of the Test: . Drink plenty of water. Do not drink any water within one hour of the test. . Do not eat any food 4 hours prior to the test. . You may take your regular medications prior to the test.  . Take metoprolol (Lopressor) two hours prior to test.   After the Test: . Drink plenty of water. . After receiving IV contrast, you may experience a mild flushed feeling. This is normal. . On occasion, you may experience  a mild rash up to 24 hours after the test. This is not dangerous. If this occurs, you can take Benadryl 25 mg and increase your fluid intake. . If you experience trouble breathing, this can be serious. If it is severe call 911 IMMEDIATELY. If it is mild, please call our office. . If you take any of these medications: Glipizide/Metformin, Avandament, Glucavance, please do not take 48 hours after completing test unless otherwise  instructed.   Once we have confirmed authorization from your insurance company, we will call you to set up a date and time for your test.   For non-scheduling related questions, please contact the cardiac imaging nurse navigator should you have any questions/concerns: Marchia Bond, Cardiac Imaging Nurse Navigator Burley Saver, Interim Cardiac Imaging Nurse [facility] Direct Office Dial: [phone]   For scheduling needs, including cancellations and rescheduling, please call [phone].

## 2020-05-12 MED FILL — CYCLOBENZAPRINE HCL 5 MG TA: 5 | 15 days supply | Qty: 15 | Fill #0

## 2020-05-13 MED FILL — POLYMYXIN B/TMP EYE DROPS: 10000-0.1 | 25 days supply | Qty: 10 | Fill #0

## 2020-05-19 ENCOUNTER — Telehealth (HOSPITAL_COMMUNITY): Payer: Self-pay | Admitting: *Deleted

## 2020-05-19 NOTE — Telephone Encounter (Signed)
Attempted to call patient regarding upcoming cardiac CT appointment. °Left message on voicemail with name and callback number ° ° Tai RN Navigator Cardiac Imaging °[facility] °[phone] Office °[phone] Cell ° °

## 2020-05-19 NOTE — Telephone Encounter (Signed)
Pt returning call regarding upcoming cardiac imaging study; pt verbalizes understanding of appt date/time, parking situation and where to check in, pre-test NPO status and medications ordered, and verified current allergies; name and call back number provided for further questions should they arise ° °Edgerrin Correia Tai RN Navigator Cardiac Imaging °[facility] °[phone] office °[phone] cell ° °

## 2020-05-20 ENCOUNTER — Ambulatory Visit (HOSPITAL_COMMUNITY)
Admission: RE | Admit: 2020-05-20 | Discharge: 2020-05-20 | Disposition: A | Discharge status: Home / Self Care | Payer: Medicare Other | Source: Ambulatory Visit | Attending: Adult Health | Admitting: Adult Health

## 2020-05-20 ENCOUNTER — Encounter (HOSPITAL_COMMUNITY): Payer: Self-pay

## 2020-05-20 ENCOUNTER — Other Ambulatory Visit: Payer: Self-pay

## 2020-05-20 DIAGNOSIS — R0789 Other chest pain: Secondary | ICD-10-CM | POA: Insufficient documentation

## 2020-05-20 DIAGNOSIS — I2510 Atherosclerotic heart disease of native coronary artery without angina pectoris: Secondary | ICD-10-CM | POA: Diagnosis not present

## 2020-05-20 LAB — POCT I-STAT CREATININE: Creatinine, Ser: 0.9 mg/dL (ref 0.61–1.24)

## 2020-05-20 MED ORDER — IOHEXOL 350 MG/ML SOLN
80.0000 mL | Freq: Once | INTRAVENOUS | Status: AC | PRN
  Administered 2020-05-20: 80 mL via INTRAVENOUS

## 2020-05-20 MED ORDER — NITROGLYCERIN 0.4 MG SL SUBL: 0.8000 mg | SUBLINGUAL_TABLET | Freq: Once | SUBLINGUAL | Status: AC

## 2020-05-20 MED ORDER — NITROGLYCERIN 0.4 MG SL SUBL
SUBLINGUAL_TABLET | SUBLINGUAL | Status: AC
  Administered 2020-05-20: 0.8 mg via SUBLINGUAL
  Filled 2020-05-20: qty 2

## 2020-06-28 ENCOUNTER — Other Ambulatory Visit: Payer: Self-pay | Admitting: Neurology

## 2020-07-18 NOTE — Progress Notes (Addendum)
PATIENT: Jose [name]. DOB: Mar 27, 1936  REASON FOR VISIT: follow up HISTORY FROM: patient  HISTORY OF PRESENT ILLNESS: Today 07/19/20  Jose [name] is a 84 year old male history of peripheral neuropathy.  He has borderline diabetes, followed for monoclonal antibody (no recollection of this).  He has bilateral foot drops.  Laboratory evaluation B12, sed rate, angiotensin-converting enzyme, ANA, b.burgdorfi antibodies, multiple myeloma panel were unremarkable with exception of positive ANA, likely no significant clinical significance.  He has not wanted to be on any medications for the neuropathy discomfort. We are prescribing his Eliquis for AFIB.  He is on CPAP, followed by Dr. Rexene Alberts.  For the last several months, has sensation of bone spurs to right foot, around the ball of the foot with weightbearing, also has some joint issues of the right great toe, 4-5 toes are curling under.  Has seen podiatrist previously.  Had 1 fall back in May, fell backwards, went to the ER work-up was unremarkable.  Continues to have right mid thoracic pain, is daily, doesn't interfere with daily activities but is bothersome.  Thoracic x-ray was unremarkable.  Most of his neuropathy is numbness to the right foot, left foot is not problematic.  Lives with his wife, drives a car, presents today for evaluation unaccompanied.  He sleeps well at night.  HISTORY 01/14/2020 Dr. Jannifer Franklin: Jose [name] is an 84 year old right-handed white male with a history of peripheral neuropathy.  The patient has a history of borderline diabetes but he has also been followed in the past for a monoclonal antibody by Dr. Lamonte Sakai.  The patient himself does not recollect anything about this.  The patient has come into the office today with some worsening symptoms of the neuropathy.  He has noted some lancinating pains in the feet, starting on the right foot and then going to the left.  He has a sensation of numbness up to the knees.  He has noted some  slight imbalance problems, he has a history of bilateral foot drops.  He has had significant issues with a left ulnar neuropathy in the past and he has had some occasional left occipital neuralgia symptoms.  The patient indicates that he does not want medications for the pain, he wishes to do anything possible to slow down progression of the neuropathy.  He has not had any falls.  He is able to sleep at night.   REVIEW OF SYSTEMS: Out of a complete 14 system review of symptoms, the patient complains only of the following symptoms, and all other reviewed systems are negative.  Foot pain, back pain  ALLERGIES: Allergies  Allergen Reactions  . Maprotiline Other (See Comments)    TETRACYCLIC ANTIDEPRESSANTS UNSPECIFIED REACTION (patient is unaware of allergy) TETRACYCLIC ANTIDEPRESSANTS UNSPECIFIED REACTION (patient is unaware of allergy)  . Tetracycline Hcl Itching and Rash    Rash on hands    HOME MEDICATIONS: Outpatient Medications Prior to Visit  Medication Sig Dispense Refill  . amLODipine (NORVASC) 5 MG tablet TAKE 1 TABLET DAILY    . aspirin EC 81 MG EC tablet Take 1 tablet (81 mg total) by mouth daily.    Marland Kitchen atorvastatin (LIPITOR) 40 MG tablet TAKE 1 TABLET DAILY 90 tablet 3  . ELIQUIS 5 MG TABS tablet TAKE 1 TABLET TWICE A DAY 180 tablet 1  . isosorbide mononitrate (IMDUR) 30 MG 24 hr tablet TAKE 1 TABLET TWICE A DAY (DOSE INCREASE) 180 tablet 1  . labetalol (NORMODYNE) 200 MG tablet Take 100 mg by  mouth 2 (two) times daily.    Marland Kitchen losartan (COZAAR) 50 MG tablet Take 1 tablet by mouth daily.    . Multiple Vitamins-Minerals (PRESERVISION AREDS 2) CAPS Take 1 capsule by mouth 2 (two) times daily.     . nitroGLYCERIN (NITROSTAT) 0.4 MG SL tablet Place 1 tablet (0.4 mg total) under the tongue every 5 (five) minutes x 3 doses as needed for chest pain. 25 tablet 3  . ROCKLATAN 0.02-0.005 % SOLN INSTILL 1 DROP INTO THE RIGHT EYE QHS    . traMADol (ULTRAM) 50 MG tablet Take 1 tablet (50 mg  total) by mouth every 6 (six) hours as needed. 15 tablet 0  . metoprolol tartrate (LOPRESSOR) 50 MG tablet Take 1 tablet (50 mg total) by mouth once for 1 dose. 1 tablet 0  . prednisoLONE acetate (PRED FORTE) 1 % ophthalmic suspension Place 1 drop into the left eye 2 (two) times daily.     No facility-administered medications prior to visit.    PAST MEDICAL HISTORY: Past Medical History:  Diagnosis Date  . Arthritis   . CAD (coronary artery disease)    04/15/18 65% dLAD, 20% pLAD, 30% 2nd diag, Normal EF  . Dysrhythmia    afib  . Gallstones 07/1996  . Headache    couple times a year  . History of CT scan of head 1997   small vessel disease  . History of ETT 05/2002    no EKG changes  . History of MRI of cervical spine 07/15/2002  . Hypertension   . Lumbar spondylolysis 12/17/2016  . MGUS (monoclonal gammopathy of unknown significance) 04/11/2012  . Occipital neuralgia of left side 12/09/2018  . Peripheral neuropathy   . Pneumonia 10/2014  . PONV (postoperative nausea and vomiting)    once many years ago  . Primary localized osteoarthritis of left hip 07/08/2018  . Primary localized osteoarthrosis of left shoulder 12/25/2016  . Primary localized osteoarthrosis of right shoulder 11/05/2017  . Rectal bleeding 12/2002   colonoscopy  . Seizures ([place])    last seizure 1976  . Skin cancer   . Sleep apnea    uses CPAP  . Spinal stenosis, lumbar region with neurogenic claudication   . Stroke [facility])    ?tia ?afib  . TIA (transient ischemic attack)   . 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 extremity 03/29/2015    PAST SURGICAL HISTORY: Past Surgical History:  Procedure Laterality Date  . ANTERIOR CERVICAL DECOMP/DISCECTOMY FUSION N/A 02/04/2015   Procedure: ANTERIOR CERVICAL DECOMPRESSION/DISCECTOMY FUSION CERVICAL FIVE-SIX,CERVICAL SIX-SEVEN;  Surgeon: Karie Chimera, MD;  Location: [place] [facility];  Service: Neurosurgery;  Laterality: N/A;  .  BACK SURGERY    . CATARACT EXTRACTION Bilateral   . COLONOSCOPY W/ POLYPECTOMY  04/19/2003   tubular adenoma  . EYE SURGERY     Bilateral Cataracts  . GREAT TOE ARTHRODESIS, INTERPHALANGEAL JOINT Left 1978  . JOINT REPLACEMENT    . joint replacement on right thumb Right   . LEFT HEART CATH AND CORONARY ANGIOGRAPHY N/A 04/15/2018   Procedure: LEFT HEART CATH AND CORONARY ANGIOGRAPHY;  Surgeon: Troy Sine, MD;  Location: [place] [facility];  Service: Cardiovascular;  Laterality: N/A;  . LUMBAR LAMINECTOMY/DECOMPRESSION MICRODISCECTOMY N/A 07/19/2017   Procedure: LAMINECTOMY AND FORAMINOTOMY  LUMBAR TWO- LUMBAR THREE, LUMBAR THREE- LUMBAR FOUR;  Surgeon: Ashok Pall, MD;  Location: [place];  Service: Neurosurgery;  Laterality: N/A;  . nerve transfer surgery to  lwft hand Left    Nov. 2017  . SHOULDER ARTHROSCOPY W/ ROTATOR CUFF REPAIR Left 15   rotator cuff   . SHOULDER INJECTION  June 2012   left, [place]  . SKIN CANCER EXCISION  07/1993   left lateral arm  . SUPERFICIAL KERATECTOMY Right 05/1998   wrist  . thumb injection  June 2012   left, [place]  . TONSILLECTOMY AND ADENOIDECTOMY    . TOTAL HIP ARTHROPLASTY Left 07/08/2018  . TOTAL HIP ARTHROPLASTY Left 07/08/2018   Procedure: LEFT TOTAL HIP ARTHROPLASTY;  Surgeon: Marchia Bond, MD;  Location: [place];  Service: Orthopedics;  Laterality: Left;  . TOTAL SHOULDER ARTHROPLASTY Left 12/25/2016   Procedure: TOTAL SHOULDER ARTHROPLASTY;  Surgeon: Marchia Bond, MD;  Location: [place];  Service: Orthopedics;  Laterality: Left;  . TOTAL SHOULDER ARTHROPLASTY Right 11/05/2017  . TOTAL SHOULDER ARTHROPLASTY Right 11/05/2017   Procedure: TOTAL SHOULDER ARTHROPLASTY;  Surgeon: Marchia Bond, MD;  Location: [place];  Service: Orthopedics;  Laterality: Right;  . ulnar nerve transfer  12/1999   left  . ULNAR NERVE TRANSPOSITION Left     FAMILY HISTORY: Family History  Problem Relation Age of Onset  . Dementia Mother 74       vascular  .  Transient ischemic attack Mother   . Dementia Father        after hip fracture  . Hypertension Sister        skin cancer  . Seizures Neg Hx     SOCIAL HISTORY: Social History   Socioeconomic History  . Marital status: Married    Spouse name: [name]  . Number of children: 2  . Years of education: 14  . Highest education level: Not on file  Occupational History  . Occupation: retired    Fish farm manager: UNEMPLOYED  Tobacco Use  . Smoking status: Former Smoker    Packs/day: 2.00    Years: 13.00    Pack years: 26.00    Types: Cigarettes    Quit date: 03/05/1968    Years since quitting: 52.4  . Smokeless tobacco: Never Used  Vaping Use  . Vaping Use: Never used  Substance and Sexual Activity  . Alcohol use: Yes    Comment: social  . Drug use: No  . Sexual activity: Not Currently  Other Topics Concern  . Not on file  Social History Narrative   Lives with 4th wife, [name], married 2010   Retired, organizational psychology   [employer]   Son, [place], [name], 2 children   Daughter, [place], in [place], 2 children   Patient is right handed   Patient drinks approximately 3-4 cups of caffeine daily   Social Determinants of Health   Financial Resource Strain:   . Difficulty of Paying Living Expenses: Not on file  Food Insecurity:   . Worried About Charity fundraiser in the Last Year: Not on file  . Ran Out of Food in the Last Year: Not on file  Transportation Needs:   . Lack of Transportation (Medical): Not on file  . Lack of Transportation (Non-Medical): Not on file  Physical Activity:   . Days of Exercise per Week: Not on file  . Minutes of Exercise per Session: Not on file  Stress:   . Feeling of Stress : Not on file  Social Connections:   . Frequency of Communication with Friends and Family: Not on file  . Frequency of Social Gatherings with Friends and Family: Not on file  .  Attends Religious Services: Not on file  . Active Member of  Clubs or Organizations: Not on file  . Attends Archivist Meetings: Not on file  . Marital Status: Not on file  Intimate Partner Violence:   . Fear of Current or Ex-Partner: Not on file  . Emotionally Abused: Not on file  . Physically Abused: Not on file  . Sexually Abused: Not on file    PHYSICAL EXAM  Vitals:   07/19/20 1052  BP: 119/80  Pulse: 73  Weight: 211 lb 12.8 oz (96.1 kg)  Height: $Remove'6\' 1"'gthNlPY$  (1.854 m)   Body mass index is 27.94 kg/m.  Generalized: Well developed, in no acute distress   Neurological examination  Mentation: Alert oriented to time, place, history taking. Follows all commands speech and language fluent Cranial nerve II-XII: Pupils were equal round reactive to light. Extraocular movements were full, visual field were full on confrontational test. Facial sensation and strength were normal.  Head turning and shoulder shrug  were normal and symmetric. Motor: The motor testing reveals 5 over 5 strength of all 4 extremities. Good symmetric motor tone is noted throughout.  Bilateral foot drops. Sensory: Stocking pattern sensory deficit up to the ankle bilaterally to pinprick and vibration. Coordination: Cerebellar testing reveals good finger-nose-finger and heel-to-shin bilaterally.  Gait and station: Gait is wide-based, can walk without assistance Reflexes: Deep tendon reflexes are symmetric but depressed  DIAGNOSTIC DATA (LABS, IMAGING, TESTING) - I reviewed patient records, labs, notes, testing and imaging myself where available.  Lab Results  Component Value Date   WBC 5.0 08/02/2019   HGB 12.8 (L) 08/02/2019   HCT 39.1 08/02/2019   MCV 98.5 08/02/2019   PLT 161 08/02/2019      Component Value Date/Time   NA 139 08/02/2019 1004   K 3.5 08/02/2019 1004   CL 103 08/02/2019 1004   CO2 24 08/02/2019 1004   GLUCOSE 146 (H) 08/02/2019 1004   BUN 18 08/02/2019 1004   CREATININE 0.90 05/20/2020 1053   CALCIUM 9.1 08/02/2019 1004   PROT 6.5  01/14/2020 0811   ALBUMIN 4.2 08/04/2018 0838   AST 25 08/04/2018 0838   ALT 24 08/04/2018 0838   ALKPHOS 136 (H) 08/04/2018 0838   BILITOT 0.6 08/04/2018 0838   GFRNONAA >60 08/02/2019 1004   GFRAA >60 08/02/2019 1004   Lab Results  Component Value Date   CHOL 85 (L) 08/04/2018   HDL 41 08/04/2018   LDLCALC 32 08/04/2018   LDLDIRECT 120 (H) 08/03/2010   TRIG 59 08/04/2018   CHOLHDL 2.1 08/04/2018   Lab Results  Component Value Date   HGBA1C 5.7 (H) 04/15/2018   Lab Results  Component Value Date   [id number] 829 01/14/2020   Lab Results  Component Value Date   TSH 1.096 11/24/2014    ASSESSMENT AND PLAN 84 y.o. year old male  has a past medical history of Arthritis, CAD (coronary artery disease), Dysrhythmia, Gallstones (07/1996), Headache, History of CT scan of head (1997), History of ETT (05/2002 ), History of MRI of cervical spine (07/15/2002), Hypertension, Lumbar spondylolysis (12/17/2016), MGUS (monoclonal gammopathy of unknown significance) (04/11/2012), Occipital neuralgia of left side (12/09/2018), Peripheral neuropathy, Pneumonia (10/2014), PONV (postoperative nausea and vomiting), Primary localized osteoarthritis of left hip (07/08/2018), Primary localized osteoarthrosis of left shoulder (12/25/2016), Primary localized osteoarthrosis of right shoulder (11/05/2017), Rectal bleeding (12/2002), Seizures ([place]), Skin cancer, Sleep apnea, Spinal stenosis, lumbar region with neurogenic claudication, Stroke ([place]), TIA (transient ischemic attack), Ulnar neuropathy at elbow (  12/24/2014), and 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 extremity (03/29/2015). here with:  1.  Peripheral neuropathy 2.  Mild gait disturbance 3.  Bilateral foot drop 4.  History of MGUS 5.  Right sided thoracic pain, post fall  -Does not desire any symptomatic medications for the neuropathy -Encouraged to see podiatry, regarding right mechanical foot pain -Will check MRI thoracic spine due to continued pain since  fall in May 2021, pain is right mid to lower thoracic, feels daily -He will call for follow-up, is considering as needed  Addendum 09/21/20 SS: Mr, [name] has continued to use and benefit from CPAP, he may be eligible for a new CPAP.  Review of download from 06/15/20- 09/12/2020 shows PERFECT 100% compliance, greater than 4 hours for the last 90/90 days.  Average total usage 7 hours 41 minutes, set pressure 12 cmH2O, leak in the 95th percentile 18.4, AHI 0.1.   I spent 30 minutes of face-to-face and non-face-to-face time with patient.  This included previsit chart review, lab review, study review, order entry, electronic health record documentation, patient education.  Butler Denmark, AGNP-C, DNP 07/19/2020, 11:57 AM [facility] [address], [place], [place] [address] [phone]

## 2020-07-19 ENCOUNTER — Telehealth: Payer: Self-pay | Admitting: Neurology

## 2020-07-19 ENCOUNTER — Other Ambulatory Visit: Payer: Self-pay

## 2020-07-19 ENCOUNTER — Encounter: Payer: Self-pay | Admitting: Neurology

## 2020-07-19 ENCOUNTER — Ambulatory Visit (INDEPENDENT_AMBULATORY_CARE_PROVIDER_SITE_OTHER): Payer: Medicare Other | Admitting: Neurology

## 2020-07-19 VITALS — BP 119/80 | HR 73 | Ht 73.0 in | Wt 211.8 lb

## 2020-07-19 DIAGNOSIS — M549 Dorsalgia, unspecified: Secondary | ICD-10-CM

## 2020-07-19 DIAGNOSIS — D472 Monoclonal gammopathy: Secondary | ICD-10-CM

## 2020-07-19 DIAGNOSIS — G6289 Other specified polyneuropathies: Secondary | ICD-10-CM | POA: Diagnosis not present

## 2020-07-19 DIAGNOSIS — M546 Pain in thoracic spine: Secondary | ICD-10-CM | POA: Diagnosis not present

## 2020-07-19 DIAGNOSIS — G8929 Other chronic pain: Secondary | ICD-10-CM

## 2020-07-19 NOTE — Telephone Encounter (Signed)
medicare/tricare order sent to GI. No auth they will reach out to the patient to schedule.  

## 2020-07-19 NOTE — Progress Notes (Signed)
I have read the note, and I agree with the clinical assessment and plan.  Ladeidra Borys K Dashel Goines   

## 2020-07-19 NOTE — Patient Instructions (Addendum)
Encourage you to see if your podiatrist May consider further back imaging  Be careful with walking, not to to fall Keep follow-up appointment open

## 2020-07-27 ENCOUNTER — Other Ambulatory Visit: Payer: Medicare Other

## 2020-08-01 ENCOUNTER — Other Ambulatory Visit: Payer: Self-pay | Admitting: Cardiovascular Disease

## 2020-08-02 ENCOUNTER — Other Ambulatory Visit (HOSPITAL_BASED_OUTPATIENT_CLINIC_OR_DEPARTMENT_OTHER): Payer: Self-pay | Admitting: Family Medicine

## 2020-08-02 MED FILL — AMOX-CLAV 875-125 MG TABLET: 875-125 | 10 days supply | Qty: 20 | Fill #0

## 2020-08-07 ENCOUNTER — Other Ambulatory Visit: Payer: Self-pay | Admitting: Cardiovascular Disease

## 2020-08-10 ENCOUNTER — Other Ambulatory Visit: Payer: Medicare Other

## 2020-08-13 NOTE — Progress Notes (Signed)
Chief Complaint  Patient presents with  . Follow-up    CAD   History of Present Illness: 84 yo male with history of HTN, HLD, paroxysmal atrial fibrillation, TIA, MGUS and CAD here today for cardiac follow up. He had been followed by Dr. Atilano Median in [facility] until June 2019. He was admitted to [facility] in June 2019 with fluttering in his chest with associated chest pain. Troponin was negative. Nuclear stress test with possible ischemia. Cardiac cath June 2019 with mild to moderate non-obstructive disease in the LAD, mild disease in the RCA. Echo 04/15/18 with LVEF=60-65%. No significant valve disease. He was discharged on Imdur.   He is here today for follow up. The patient denies any dyspnea, palpitations, lower extremity edema, orthopnea, PND, dizziness, near syncope or syncope. He has chronic chest pain but occasional worsening sharp pains.   Primary Care Physician: Karlene Einstein, MD  Past Medical History:  Diagnosis Date  . Arthritis   . CAD (coronary artery disease)    04/15/18 65% dLAD, 20% pLAD, 30% 2nd diag, Normal EF  . Dysrhythmia    afib  . Gallstones 07/1996  . Headache    couple times a year  . History of CT scan of head 1997   small vessel disease  . History of ETT 05/2002    no EKG changes  . History of MRI of cervical spine 07/15/2002  . Hypertension   . Lumbar spondylolysis 12/17/2016  . MGUS (monoclonal gammopathy of unknown significance) 04/11/2012  . Occipital neuralgia of left side 12/09/2018  . Peripheral neuropathy   . Pneumonia 10/2014  . PONV (postoperative nausea and vomiting)    once many years ago  . Primary localized osteoarthritis of left hip 07/08/2018  . Primary localized osteoarthrosis of left shoulder 12/25/2016  . Primary localized osteoarthrosis of right shoulder 11/05/2017  . Rectal bleeding 12/2002   colonoscopy  . Seizures ([place])    last seizure 1976  . Skin cancer   . Sleep apnea    uses CPAP  . Spinal stenosis, lumbar region with neurogenic  claudication   . Stroke [facility])    ?tia ?afib  . TIA (transient ischemic attack)   . 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 ANTERIOR CERVICAL DECOMP/DISCECTOMY FUSION N/A 02/04/2015   Procedure: ANTERIOR CERVICAL DECOMPRESSION/DISCECTOMY FUSION CERVICAL FIVE-SIX,CERVICAL SIX-SEVEN;  Surgeon: Karie Chimera, MD;  Location: [place] [facility];  Service: Neurosurgery;  Laterality: N/A;  . BACK SURGERY    . CATARACT EXTRACTION Bilateral   . COLONOSCOPY W/ POLYPECTOMY  04/19/2003   tubular adenoma  . EYE SURGERY     Bilateral Cataracts  . GREAT TOE ARTHRODESIS, INTERPHALANGEAL JOINT Left 1978  . JOINT REPLACEMENT    . joint replacement on right thumb Right   . LEFT HEART CATH AND CORONARY ANGIOGRAPHY N/A 04/15/2018   Procedure: LEFT HEART CATH AND CORONARY ANGIOGRAPHY;  Surgeon: Troy Sine, MD;  Location: [place] [facility];  Service: Cardiovascular;  Laterality: N/A;  . LUMBAR LAMINECTOMY/DECOMPRESSION MICRODISCECTOMY N/A 07/19/2017   Procedure: LAMINECTOMY AND FORAMINOTOMY  LUMBAR TWO- LUMBAR THREE, LUMBAR THREE- LUMBAR FOUR;  Surgeon: Ashok Pall, MD;  Location: [place];  Service: Neurosurgery;  Laterality: N/A;  . nerve transfer surgery to lwft hand Left    Nov. 2017  . SHOULDER ARTHROSCOPY W/ ROTATOR CUFF REPAIR Left 15   rotator cuff   .  SHOULDER INJECTION  June 2012   left, [place]  . SKIN CANCER EXCISION  07/1993   left lateral arm  . SUPERFICIAL KERATECTOMY Right 05/1998   wrist  . thumb injection  June 2012   left, [place]  . TONSILLECTOMY AND ADENOIDECTOMY    . TOTAL HIP ARTHROPLASTY Left 07/08/2018  . TOTAL HIP ARTHROPLASTY Left 07/08/2018   Procedure: LEFT TOTAL HIP ARTHROPLASTY;  Surgeon: Marchia Bond, MD;  Location: [place];  Service: Orthopedics;  Laterality: Left;  . TOTAL SHOULDER ARTHROPLASTY Left 12/25/2016   Procedure: TOTAL SHOULDER ARTHROPLASTY;   Surgeon: Marchia Bond, MD;  Location: [place];  Service: Orthopedics;  Laterality: Left;  . TOTAL SHOULDER ARTHROPLASTY Right 11/05/2017  . TOTAL SHOULDER ARTHROPLASTY Right 11/05/2017   Procedure: TOTAL SHOULDER ARTHROPLASTY;  Surgeon: Marchia Bond, MD;  Location: [place];  Service: Orthopedics;  Laterality: Right;  . ulnar nerve transfer  12/1999   left  . ULNAR NERVE TRANSPOSITION Left     Current Outpatient Medications  Medication Sig Dispense Refill  . amLODipine (NORVASC) 5 MG tablet TAKE 1 TABLET DAILY    . aspirin EC 81 MG EC tablet Take 1 tablet (81 mg total) by mouth daily.    Marland Kitchen atorvastatin (LIPITOR) 40 MG tablet TAKE 1 TABLET DAILY 90 tablet 2  . ELIQUIS 5 MG TABS tablet TAKE 1 TABLET TWICE A DAY 180 tablet 1  . isosorbide mononitrate (IMDUR) 30 MG 24 hr tablet TAKE 1 TABLET TWICE A DAY (DOSE INCREASE) 180 tablet 0  . labetalol (NORMODYNE) 200 MG tablet Take 100 mg by mouth 2 (two) times daily.    Marland Kitchen losartan (COZAAR) 50 MG tablet Take 1 tablet by mouth daily.    . Multiple Vitamins-Minerals (PRESERVISION AREDS 2) CAPS Take 1 capsule by mouth 2 (two) times daily.     . nitroGLYCERIN (NITROSTAT) 0.4 MG SL tablet Place 1 tablet (0.4 mg total) under the tongue every 5 (five) minutes x 3 doses as needed for chest pain. 25 tablet 3  . ROCKLATAN 0.02-0.005 % SOLN INSTILL 1 DROP INTO THE RIGHT EYE QHS    . traMADol (ULTRAM) 50 MG tablet Take 1 tablet (50 mg total) by mouth every 6 (six) hours as needed. 15 tablet 0   No current facility-administered medications for this visit.    Allergies  Allergen Reactions  . Maprotiline Other (See Comments)    TETRACYCLIC ANTIDEPRESSANTS UNSPECIFIED REACTION (patient is unaware of allergy) TETRACYCLIC ANTIDEPRESSANTS UNSPECIFIED REACTION (patient is unaware of allergy)  . Tetracycline Hcl Itching and Rash    Rash on hands    Social History   Socioeconomic History  . Marital status: Married    Spouse name: [name]  . Number  of children: 2  . Years of education: 57  . Highest education level: Not on file  Occupational History  . Occupation: retired    Fish farm manager: UNEMPLOYED  Tobacco Use  . Smoking status: Former Smoker    Packs/day: 2.00    Years: 13.00    Pack years: 26.00    Types: Cigarettes    Quit date: 03/05/1968    Years since quitting: 52.4  . Smokeless tobacco: Never Used  Vaping Use  . Vaping Use: Never used  Substance and Sexual Activity  . Alcohol use: Yes    Comment: social  . Drug use: No  . Sexual activity: Not Currently  Other Topics Concern  . Not on file  Social History Narrative   Lives with 4th wife, Malachi Pro  [place], married 2010   Retired, Physicist, medical   Son, [place], [name], 2 children   Daughter, [place], in [place], 2 children   Patient is right handed   Patient drinks approximately 3-4 cups of caffeine daily   Social Determinants of Health   Financial Resource Strain:   . Difficulty of Paying Living Expenses: Not on file  Food Insecurity:   . Worried About Charity fundraiser in the Last Year: Not on file  . Ran Out of Food in the Last Year: Not on file  Transportation Needs:   . Lack of Transportation (Medical): Not on file  . Lack of Transportation (Non-Medical): Not on file  Physical Activity:   . Days of Exercise per Week: Not on file  . Minutes of Exercise per Session: Not on file  Stress:   . Feeling of Stress : Not on file  Social Connections:   . Frequency of Communication with Friends and Family: Not on file  . Frequency of Social Gatherings with Friends and Family: Not on file  . Attends Religious Services: Not on file  . Active Member of Clubs or Organizations: Not on file  . Attends Archivist Meetings: Not on file  . Marital Status: Not on file  Intimate Partner Violence:   . Fear of Current or Ex-Partner: Not on file  . Emotionally Abused: Not on file  . Physically Abused: Not on file  . Sexually  Abused: Not on file    Family History  Problem Relation Age of Onset  . Dementia Mother 74       vascular  . Transient ischemic attack Mother   . Dementia Father        after hip fracture  . Hypertension Sister        skin cancer  . Seizures Neg Hx     Review of Systems:  As stated in the HPI and otherwise negative.   BP 124/70   Pulse 62   Ht 6\' 1"  (1.854 m)   Wt 210 lb 9.6 oz (95.5 kg)   SpO2 97%   BMI 27.79 kg/m   Physical Examination: General: Well developed, well nourished, NAD  HEENT: OP clear, mucus membranes moist  SKIN: warm, dry. No rashes. Neuro: No focal deficits  Musculoskeletal: Muscle strength 5/5 all ext  Psychiatric: Mood and affect normal  Neck: No JVD, no carotid bruits, no thyromegaly, no lymphadenopathy.  Lungs:Clear bilaterally, no wheezes, rhonci, crackles Cardiovascular: Regular rate and rhythm. No murmurs, gallops or rubs. Abdomen:Soft. Bowel sounds present. Non-tender.  Extremities: No lower extremity edema. Pulses are 2 + in the bilateral DP/PT.  EKG:  EKG ordered today. The ekg ordered today demonstrates   Echo June 2019:   - Left ventricle: The cavity size was normal. Wall thickness was  increased in a pattern of moderate LVH. Systolic function was  normal. The estimated ejection fraction was in the range of 60%  to 65%. Wall motion was normal; there were no regional wall  motion abnormalities. Doppler parameters are consistent with  abnormal left ventricular relaxation (grade 1 diastolic  dysfunction).  - Aorta: Aortic root dimension: 40 mm (ED).  - Aortic root: The aortic root is mildly dilated.  - Mitral valve: Mildly thickened leaflets . There was trivial  regurgitation.  - Left atrium: The atrium was normal in size.  - Inferior vena cava: The vessel was normal in size. The  respirophasic diameter changes were in the normal range (>=  50%),  consistent with normal central venous pressure.   Recent  Labs: 05/20/2020: Creatinine, Ser 0.90   Lipid Panel    Component Value Date/Time   CHOL 85 (L) 08/04/2018 0838   TRIG 59 08/04/2018 0838   HDL 41 08/04/2018 0838   CHOLHDL 2.1 08/04/2018 0838   CHOLHDL 3.8 04/15/2018 0351   VLDL 39 04/15/2018 0351   LDLCALC 32 08/04/2018 0838   LDLDIRECT 120 (H) 08/03/2010 1719     Wt Readings from Last 3 Encounters:  08/15/20 210 lb 9.6 oz (95.5 kg)  07/19/20 211 lb 12.8 oz (96.1 kg)  04/20/20 217 lb 6.4 oz (98.6 kg)     Other studies Reviewed: Additional studies/ records that were reviewed today include: . Review of the above records demonstrates:    Assessment and Plan:   1. CAD with angina: No chest pain. He has mild non-obstructive disease by cath in June 2019. Will continue ASA, statin, Norvasc, Imdur and beta blocker.    2. Hyperlipidemia: LDL not at goal in 2019. Will continue statin  I offered to check his lipids but he wishes to discuss with his primary care doctor.   3. HTN: BP is well controlled. No changes  4. Atrial fibrillation, paroxysmal: Sinus today on exam. He has no palpitations. Will continue Eliquis and beta blocker.    Current medicines are reviewed at length with the patient today.  The patient does not have concerns regarding medicines.  The following changes have been made:  no change  Labs/ tests ordered today include:  No orders of the defined types were placed in this encounter.  Disposition:   F/U with me  in 12 months   Signed, Lauree Chandler, MD 08/15/2020 11:30 AM    [address], [place], [place]  [address] Phone: [phone]; Fax: [phone]

## 2020-08-15 ENCOUNTER — Other Ambulatory Visit: Payer: Self-pay

## 2020-08-15 ENCOUNTER — Encounter: Payer: Self-pay | Admitting: Cardiovascular Disease

## 2020-08-15 ENCOUNTER — Ambulatory Visit (INDEPENDENT_AMBULATORY_CARE_PROVIDER_SITE_OTHER): Payer: Medicare Other | Admitting: Cardiovascular Disease

## 2020-08-15 VITALS — BP 124/70 | HR 62 | Ht 73.0 in | Wt 210.6 lb

## 2020-08-15 DIAGNOSIS — E785 Hyperlipidemia, unspecified: Secondary | ICD-10-CM

## 2020-08-15 DIAGNOSIS — I25118 Atherosclerotic heart disease of native coronary artery with other forms of angina pectoris: Secondary | ICD-10-CM | POA: Diagnosis not present

## 2020-08-15 DIAGNOSIS — I10 Essential (primary) hypertension: Secondary | ICD-10-CM

## 2020-08-15 DIAGNOSIS — I480 Paroxysmal atrial fibrillation: Secondary | ICD-10-CM

## 2020-08-15 DIAGNOSIS — I2510 Atherosclerotic heart disease of native coronary artery without angina pectoris: Secondary | ICD-10-CM

## 2020-08-15 NOTE — Patient Instructions (Signed)

## 2020-08-16 ENCOUNTER — Ambulatory Visit
Admission: RE | Admit: 2020-08-16 | Discharge: 2020-08-16 | Disposition: A | Discharge status: Home / Self Care | Payer: Medicare Other | Source: Ambulatory Visit | Attending: Neurology | Admitting: Neurology

## 2020-08-16 DIAGNOSIS — M549 Dorsalgia, unspecified: Secondary | ICD-10-CM

## 2020-08-16 MED ORDER — GADOBENATE DIMEGLUMINE 529 MG/ML IV SOLN
20.0000 mL | Freq: Once | INTRAVENOUS | Status: AC | PRN
  Administered 2020-08-16: 20 mL via INTRAVENOUS

## 2020-08-17 ENCOUNTER — Other Ambulatory Visit: Payer: Self-pay | Admitting: Neurology

## 2020-08-17 DIAGNOSIS — M549 Dorsalgia, unspecified: Secondary | ICD-10-CM

## 2020-08-25 MED FILL — TOBRAMYCIN 0.3 % SOLN: 0.3 | 25 days supply | Qty: 5 | Fill #1

## 2020-09-16 ENCOUNTER — Telehealth: Payer: Self-pay | Admitting: Neurology

## 2020-09-16 DIAGNOSIS — G4733 Obstructive sleep apnea (adult) (pediatric): Secondary | ICD-10-CM

## 2020-09-16 NOTE — Telephone Encounter (Signed)
Pt. states CPAP machine has went out & he needs a new prescription for another one. Please advise.

## 2020-09-19 NOTE — Telephone Encounter (Signed)
Pt called the office and asked to speak with me. He reports that the modem on his cpap has gone out and he cannot get readings. Other than that, the cpap is working fine but he would like a new cpap. He reports that he spoke to [facility] and they just need an order. I explained that since he has government insurance he may need more than just an order and that I am waiting on [facility] to answer my questions regarding that. I will let the pt know. Pt verbalized understanding.

## 2020-09-19 NOTE — Telephone Encounter (Signed)
I have reached out to [facility] to find out what they need to get him a new cpap. He may need another office visit and sleep study.

## 2020-09-20 NOTE — Telephone Encounter (Signed)
I called [facility]. Spoke with Janett Billow. Pt will need a new order for cpap, does not need a new sleep study, but does need office notes within past 6 months that speak to pt's continued use and benefit of cpap.

## 2020-09-21 ENCOUNTER — Telehealth: Payer: Self-pay | Admitting: Neurology

## 2020-09-21 NOTE — Addendum Note (Signed)
Addended by: Lester [place] A on: 09/21/2020 07:20 AM   Modules accepted: Orders

## 2020-09-21 NOTE — Telephone Encounter (Signed)
I called pt. I advised him that the new cpap order was sent to [facility]. I recommended that he give them a week for processing since this is a holiday week. Pt verbalized understanding.  Pt already has a 12/21/20 appt scheduled with Dr. Rexene Alberts which we can use for his new cpap follow up as long as he has been on the new one for at least 31 days.

## 2020-09-21 NOTE — Telephone Encounter (Signed)
Last office visit addendum added to reflect CPAP use and benefit for insurance purposes.

## 2020-09-21 NOTE — Telephone Encounter (Signed)
New cpap order placed for 12cwp. Order for new cpap sent to [facility] via community message. Confirmation received that the order transmitted was successful.

## 2020-10-03 ENCOUNTER — Telehealth: Payer: Self-pay | Admitting: Family Medicine

## 2020-10-03 NOTE — Telephone Encounter (Signed)
I called pt. He reports that he has not heard from [facility]. He called them and they told him they don't have an order. I advised him that this is not true, in fact, [facility] responded that they had received this order over one week ago. I reminded him of the national shortage of cpaps. He is considering changing DMEs. He has a long history of complaints regarding [facility]. He is worried that if he switches DMEs his insurance may have issues. He agreed to me reaching out to [facility]. I have emailed Jeneen Rinks. I will keep pt informed. Pt verbalized understanding.

## 2020-10-03 NOTE — Telephone Encounter (Signed)
I saw patient's wife in the office for [facility] follow-up.  During visit, wife request that we submit orders for a new CPAP machine for her husband.  He reports that his machine is currently 13 and half years old and making an unusual noise.  He is contacted DME who has been inconsistent follow-up.  Jose [name] and his wife are not happy with the services provided by [facility] and are interested in considering other DME providers.  I have advised Jose [name] that I will relay this information to Jose [name] and [name].  I will asked that [name] reach out to patient to ensure that he has all the orders that he needs.

## 2020-10-04 NOTE — Telephone Encounter (Signed)
Jeneen Rinks advised me that [facility] has called him and had to leave a message.

## 2020-10-04 NOTE — Telephone Encounter (Signed)
I called pt. He did receive a call from [facility]. He is content that [facility] did contact him and he is on the list for new cpap.

## 2020-11-07 ENCOUNTER — Other Ambulatory Visit: Payer: Self-pay | Admitting: Cardiovascular Disease

## 2020-11-24 ENCOUNTER — Telehealth: Payer: Self-pay | Admitting: Neurology

## 2020-11-24 DIAGNOSIS — G4733 Obstructive sleep apnea (adult) (pediatric): Secondary | ICD-10-CM

## 2020-11-24 NOTE — Telephone Encounter (Signed)
Download shows settings 4-20, we can adjust back to CPAP or previous settings. Please check what he would prefer. He got a new machine fairly recentlu.

## 2020-11-24 NOTE — Telephone Encounter (Addendum)
Pt called stating that the pressure to his cpap machine is to strong for him forcing him to breath through his mouth. Pt would like to know if the pressure can be lowered. Pt states he is waking up with a dry mouth. Please advise.

## 2020-11-24 NOTE — Addendum Note (Signed)
Addended by: Verlin Grills on: 11/24/2020 04:41 PM   Modules accepted: Orders

## 2020-11-24 NOTE — Telephone Encounter (Signed)
error 

## 2020-11-24 NOTE — Telephone Encounter (Signed)
I reached out to the pt and we discuss. Pt is amenable to switching back to the previous setting of CPAP setting pressure of 12 EPR of 3. Pt is still with [facility] and order has been sent with this order change.   Pt is scheduled for f.u with Dr. Rexene Alberts on 12/22/20, pt will keep as scheduled.   Per verbal order by SS,NP order has been sent.

## 2020-12-01 ENCOUNTER — Other Ambulatory Visit (HOSPITAL_BASED_OUTPATIENT_CLINIC_OR_DEPARTMENT_OTHER): Payer: Self-pay | Admitting: Orthopaedic Surgery

## 2020-12-01 MED FILL — oxyCODONE HCL 5 MG TABS: 5 | 5 days supply | Qty: 30 | Fill #0

## 2020-12-21 ENCOUNTER — Ambulatory Visit: Payer: Medicare Other | Admitting: Neurology

## 2020-12-22 ENCOUNTER — Ambulatory Visit: Payer: Medicare Other | Admitting: Neurology

## 2020-12-26 ENCOUNTER — Ambulatory Visit (INDEPENDENT_AMBULATORY_CARE_PROVIDER_SITE_OTHER): Payer: Medicare Other | Admitting: Cardiovascular Disease

## 2020-12-26 ENCOUNTER — Telehealth: Payer: Self-pay | Admitting: Cardiovascular Disease

## 2020-12-26 ENCOUNTER — Other Ambulatory Visit: Payer: Self-pay

## 2020-12-26 ENCOUNTER — Encounter: Payer: Self-pay | Admitting: Cardiovascular Disease

## 2020-12-26 ENCOUNTER — Other Ambulatory Visit: Payer: Self-pay | Admitting: Neurology

## 2020-12-26 ENCOUNTER — Other Ambulatory Visit: Payer: Self-pay | Admitting: Cardiovascular Disease

## 2020-12-26 VITALS — BP 130/76 | HR 52 | Ht 73.0 in | Wt 216.0 lb

## 2020-12-26 DIAGNOSIS — I480 Paroxysmal atrial fibrillation: Secondary | ICD-10-CM

## 2020-12-26 DIAGNOSIS — I10 Essential (primary) hypertension: Secondary | ICD-10-CM

## 2020-12-26 DIAGNOSIS — I2510 Atherosclerotic heart disease of native coronary artery without angina pectoris: Secondary | ICD-10-CM

## 2020-12-26 DIAGNOSIS — E7800 Pure hypercholesterolemia, unspecified: Secondary | ICD-10-CM

## 2020-12-26 MED ORDER — NITROGLYCERIN 0.4 MG SL SUBL: 0.4000 mg | SUBLINGUAL_TABLET | SUBLINGUAL | 3 refills | Status: DC | PRN

## 2020-12-26 MED FILL — NITROGLYCERIN 0.4 MG TAB SL: 0.4 | 7 days supply | Qty: 25 | Fill #0

## 2020-12-26 NOTE — Telephone Encounter (Signed)
Thanks

## 2020-12-26 NOTE — Progress Notes (Signed)
Chief Complaint  Patient presents with   Follow-up    CAD/Chest pain   History of Present Illness: 85 yo Jose [name] with history of HTN, HLD, paroxysmal atrial fibrillation, TIA, MGUS and CAD here today for cardiac follow up. He had been followed by Dr. Atilano Median in [facility] until June 2019. He was admitted to [facility] in June 2019 with fluttering in his chest with associated chest pain. Troponin was negative. Nuclear stress test with possible ischemia. Cardiac cath June 2019 with mild to moderate non-obstructive disease in the LAD, mild disease in the RCA. Echo 04/15/18 with LVEF=60-65%. No significant valve disease. He was discharged on Imdur. He is on Eliquis for paroxysmal atrial fibrillation.   He is here today for follow up. The patient denies any dyspnea, palpitations, lower extremity edema, orthopnea, PND, dizziness, near syncope or syncope. He has had pain in one spot on his chest for 12 months. No associated symptoms. No worsening with exertion. No positional changes.   Primary Care Physician: Karlene Einstein, MD  Past Medical History:  Diagnosis Date   Arthritis    CAD (coronary artery disease)    04/15/18 65% dLAD, 20% pLAD, 30% 2nd diag, Normal EF   Dysrhythmia    afib   Gallstones 07/1996   Headache    couple times a year   History of CT scan of head 1997   small vessel disease   History of ETT 05/2002    no EKG changes   History of MRI of cervical spine 07/15/2002   Hypertension    Lumbar spondylolysis 12/17/2016   MGUS (monoclonal gammopathy of unknown significance) 04/11/2012   Occipital neuralgia of left side 2/Jose/2020   Peripheral neuropathy    Pneumonia 10/2014   PONV (postoperative nausea and vomiting)    once many years ago   Primary localized osteoarthritis of left hip 07/08/2018   Primary localized osteoarthrosis of left shoulder 12/25/2016   Primary localized osteoarthrosis of right shoulder 11/05/2017   Rectal bleeding 12/2002   colonoscopy   Seizures  ([place])    last seizure 1976   Skin cancer    Sleep apnea    uses CPAP   Spinal stenosis, lumbar region with neurogenic claudication    Stroke [facility])    ?tia ?afib   TIA (transient ischemic attack)    Ulnar neuropathy at elbow 12/24/2014   Left   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 extremity 03/29/2015    Past Surgical History:  Procedure Laterality Date   ANTERIOR CERVICAL DECOMP/DISCECTOMY FUSION N/A 02/04/2015   Procedure: ANTERIOR CERVICAL DECOMPRESSION/DISCECTOMY FUSION CERVICAL FIVE-SIX,CERVICAL SIX-SEVEN;  Surgeon: Karie Chimera, MD;  Location: [facility];  Service: Neurosurgery;  Laterality: N/A;   BACK SURGERY     CATARACT EXTRACTION Bilateral    COLONOSCOPY W/ POLYPECTOMY  04/19/2003   tubular adenoma   EYE SURGERY     Bilateral Cataracts   GREAT TOE ARTHRODESIS, INTERPHALANGEAL JOINT Left 1978   JOINT REPLACEMENT     joint replacement on right thumb Right    LEFT HEART CATH AND CORONARY ANGIOGRAPHY N/A 04/15/2018   Procedure: LEFT HEART CATH AND CORONARY ANGIOGRAPHY;  Surgeon: Troy Sine, MD;  Location: [place] [facility];  Service: Cardiovascular;  Laterality: N/A;   LUMBAR LAMINECTOMY/DECOMPRESSION MICRODISCECTOMY N/A 07/19/2017   Procedure: LAMINECTOMY AND FORAMINOTOMY  LUMBAR TWO- LUMBAR THREE, LUMBAR THREE- LUMBAR FOUR;  Surgeon: Ashok Pall, MD;  Location: [place];  Service: Neurosurgery;  Laterality: N/A;   nerve transfer surgery to  lwft hand Left    Nov. 2017   SHOULDER ARTHROSCOPY W/ ROTATOR CUFF REPAIR Left 13   rotator cuff    SHOULDER INJECTION  June 2012   left, [facility]   SKIN CANCER EXCISION  07/1993   left lateral arm   SUPERFICIAL KERATECTOMY Right 05/1998   wrist   thumb injection  June 2012   left, [place]   TONSILLECTOMY AND ADENOIDECTOMY     TOTAL HIP ARTHROPLASTY Left 07/08/2018   TOTAL HIP ARTHROPLASTY Left 07/08/2018   Procedure: LEFT TOTAL HIP ARTHROPLASTY;  Surgeon: Marchia Bond, MD;  Location: [place];  Service:  Orthopedics;  Laterality: Left;   TOTAL SHOULDER ARTHROPLASTY Left 12/25/2016   Procedure: TOTAL SHOULDER ARTHROPLASTY;  Surgeon: Marchia Bond, MD;  Location: [place];  Service: Orthopedics;  Laterality: Left;   TOTAL SHOULDER ARTHROPLASTY Right 11/05/2017   TOTAL SHOULDER ARTHROPLASTY Right 11/05/2017   Procedure: TOTAL SHOULDER ARTHROPLASTY;  Surgeon: Marchia Bond, MD;  Location: [place];  Service: Orthopedics;  Laterality: Right;   ulnar nerve transfer  12/1999   left   ULNAR NERVE TRANSPOSITION Left     Current Outpatient Medications  Medication Sig Dispense Refill   amLODipine (NORVASC) 5 MG tablet TAKE 1 TABLET DAILY     atorvastatin (LIPITOR) 40 MG tablet TAKE 1 TABLET DAILY 90 tablet 2   ELIQUIS 5 MG TABS tablet TAKE 1 TABLET TWICE A DAY 180 tablet 1   isosorbide mononitrate (IMDUR) 30 MG 24 hr tablet TAKE 1 TABLET TWICE A DAY (DOSE INCREASE) 180 tablet 3   labetalol (NORMODYNE) 200 MG tablet Take 100 mg by mouth 2 (two) times daily.     losartan (COZAAR) 50 MG tablet Take 1 tablet by mouth daily.     Multiple Vitamins-Minerals (PRESERVISION AREDS 2) CAPS Take 1 capsule by mouth 2 (two) times daily.      ROCKLATAN 0.02-0.005 % SOLN INSTILL 1 DROP INTO THE RIGHT EYE QHS     nitroGLYCERIN (NITROSTAT) 0.4 MG SL tablet Place 1 tablet (0.4 mg total) under the tongue every 5 (five) minutes x 3 doses as needed for chest pain. 25 tablet 3   No current facility-administered medications for this visit.    Allergies  Allergen Reactions   Maprotiline Other (See Comments)    TETRACYCLIC ANTIDEPRESSANTS UNSPECIFIED REACTION (patient is unaware of allergy) TETRACYCLIC ANTIDEPRESSANTS UNSPECIFIED REACTION (patient is unaware of allergy)   Tetracycline Hcl Itching and Rash    Rash on hands    Social History   Socioeconomic History   Marital status: Married    Spouse name: [name]   Number of children: 2   Years of education: 19   Highest education  level: Not on file  Occupational History   Occupation: retired    Fish farm manager: UNEMPLOYED  Tobacco Use   Smoking status: Former Smoker    Packs/day: 2.00    Years: 13.00    Pack years: 26.00    Types: Cigarettes    Quit date: 03/05/1968    Years since quitting: 52.8   Smokeless tobacco: Never Used  Vaping Use   Vaping Use: Never used  Substance and Sexual Activity   Alcohol use: Yes    Comment: social   Drug use: No   Sexual activity: Not Currently  Other Topics Concern   Not on file  Social History Narrative   Lives with 4th wife, [name], married 2010   Retired, Physicist, medical   Son, [place], [name], 2  children   Daughter, [name], in [place], 2 children   Patient is right handed   Patient drinks approximately 3-4 cups of caffeine daily   Social Determinants of Health   Financial Resource Strain: Not on file  Food Insecurity: Not on file  Transportation Needs: Not on file  Physical Activity: Not on file  Stress: Not on file  Social Connections: Not on file  Intimate Partner Violence: Not on file    Family History  Problem Relation Age of Onset   Dementia Mother 51       vascular   Transient ischemic attack Mother    Dementia Father        after hip fracture   Hypertension Sister        skin cancer   Seizures Neg Hx     Review of Systems:  As stated in the HPI and otherwise negative.   BP 130/76    Pulse (!) 52    Ht 6\' 1"  (1.854 m)    Wt 216 lb (98 kg)    SpO2 95%    BMI 28.50 kg/m   Physical Examination:  General: Well developed, well nourished, NAD  HEENT: OP clear, mucus membranes moist  SKIN: warm, dry. No rashes. Neuro: No focal deficits  Musculoskeletal: Muscle strength 5/5 all ext  Psychiatric: Mood and affect normal  Neck: No JVD, no carotid bruits, no thyromegaly, no lymphadenopathy.  Lungs:Clear bilaterally, no wheezes, rhonci, crackles Cardiovascular: Regular rate and rhythm. No murmurs,  gallops or rubs. Abdomen:Soft. Bowel sounds present. Non-tender.  Extremities: No lower extremity edema. Pulses are 2 + in the bilateral DP/PT.  EKG:  EKG is ordered today. The ekg ordered today demonstrates Sinus, PACs  Echo June 2019:  - Left ventricle: The cavity size was normal. Wall thickness was  increased in a pattern of moderate LVH. Systolic function was  normal. The estimated ejection fraction was in the range of 60%  to 65%. Wall motion was normal; there were no regional wall  motion abnormalities. Doppler parameters are consistent with  abnormal left ventricular relaxation (grade 1 diastolic  dysfunction).  - Aorta: Aortic root dimension: 40 mm (ED).  - Aortic root: The aortic root is mildly dilated.  - Mitral valve: Mildly thickened leaflets . There was trivial  regurgitation.  - Left atrium: The atrium was normal in size.  - Inferior vena cava: The vessel was normal in size. The  respirophasic diameter changes were in the normal range (>= 50%),  consistent with normal central venous pressure.   Recent Labs: 05/20/2020: Creatinine, Ser 0.90   Lipid Panel    Component Value Date/Time   CHOL 85 (L) 08/04/2018 0838   TRIG 59 08/04/2018 0838   HDL 41 08/04/2018 0838   CHOLHDL 2.1 08/04/2018 0838   CHOLHDL 3.8 04/15/2018 0351   VLDL 39 04/15/2018 0351   LDLCALC 32 08/04/2018 0838   LDLDIRECT 120 (H) 08/03/2010 1719     Wt Readings from Last 3 Encounters:  12/26/20 216 lb (98 kg)  08/15/20 210 lb 9.6 oz (95.5 kg)  07/19/20 211 lb 12.8 oz (96.1 kg)     Other studies Reviewed: Additional studies/ records that were reviewed today include: . Review of the above records demonstrates:    Assessment and Plan:   1. CAD without angina: His pain is not felt to cardiac. The pain is atypical. He has mild to moderate non-obstructive disease by cath in June 2019. Continue ASA, statin, Imdur, Norvasc and beta blocker.  2. Hyperlipidemia: Lipids  followed in primary care. Continue statin.   3. HTN: BP is controlled. No changes  4. Atrial fibrillation, paroxysmal: Sinus today. Continue beta blocker and Eliquis.     Current medicines are reviewed at length with the patient today.  The patient does not have concerns regarding medicines.  The following changes have been made:  no change  Labs/ tests ordered today include:   Orders Placed This Encounter  Procedures   EKG 12-Lead   Disposition:   F/U with me  in 12 months   Signed, Lauree Chandler, MD 12/26/2020 3:04 PM    [facility] [place], [place], [place]  [address] Phone: [phone]; Fax: [phone]

## 2020-12-26 NOTE — Telephone Encounter (Signed)
Pt c/o of Chest Pain: STAT if CP now or developed within 24 hours  1. Are you having CP right now? Yes    2. Are you experiencing any other symptoms (ex. SOB, nausea, vomiting, sweating)? No   3. How long have you been experiencing CP? Call was disconnected before patient answered.  4. Is your CP continuous or coming and going? Continuous   5. Have you taken Nitroglycerin? No   Call was disconnected before I was able to transfer to triage. ?

## 2020-12-26 NOTE — Telephone Encounter (Signed)
Patient calling in c/o chest discomfort. He states it is "technically chest pain" but not of the "urgent sense". Patient states he has been having it on and off over the past several months. He denies any SOB, N/V diaphoresis, swelling. Patient unable to vitals at this time. Patient states chest discomfort is on the left side of his chest, does not radiate. Patient states he would like to be seen by Dr. Angelena Form as soon as possible. Placed patient in DOD slot for 2 pm.

## 2020-12-26 NOTE — Patient Instructions (Signed)
Medication Instructions:  No changes *If you need a refill on your cardiac medications before your next appointment, please call your pharmacy*   Lab Work: none If you have labs (blood work) drawn today and your tests are completely normal, you will receive your results only by: . MyChart Message (if you have MyChart) OR . A paper copy in the mail If you have any lab test that is abnormal or we need to change your treatment, we will call you to review the results.   Testing/Procedures: none   Follow-Up: At [facility], you and your health needs are our priority.  As part of our continuing mission to provide you with exceptional heart care, we have created designated Provider Care Teams.  These Care Teams include your primary Cardiologist (physician) and Advanced Practice Providers (APPs -  Physician Assistants and Nurse Practitioners) who all work together to provide you with the care you need, when you need it.  We recommend signing up for the patient portal called "MyChart".  Sign up information is provided on this After Visit Summary.  MyChart is used to connect with patients for Virtual Visits (Telemedicine).  Patients are able to view lab/test results, encounter notes, upcoming appointments, etc.  Non-urgent messages can be sent to your provider as well.   To learn more about what you can do with MyChart, go to [email or web address].    Your next appointment:   6 month(s)  The format for your next appointment:   In Person  Provider:   You may see Christopher McAlhany, MD or one of the following Advanced Practice Providers on your designated Care Team:    Dayna Dunn, PA-C  Michele Lenze, PA-C  Other Instructions   

## 2021-01-11 ENCOUNTER — Encounter: Payer: Self-pay | Admitting: Neurology

## 2021-01-11 ENCOUNTER — Ambulatory Visit (INDEPENDENT_AMBULATORY_CARE_PROVIDER_SITE_OTHER): Payer: Medicare Other | Admitting: Neurology

## 2021-01-11 VITALS — BP 134/82 | HR 52 | Ht 73.5 in | Wt 215.3 lb

## 2021-01-11 DIAGNOSIS — I2510 Atherosclerotic heart disease of native coronary artery without angina pectoris: Secondary | ICD-10-CM

## 2021-01-11 DIAGNOSIS — G4733 Obstructive sleep apnea (adult) (pediatric): Secondary | ICD-10-CM

## 2021-01-11 DIAGNOSIS — Z9989 Dependence on other enabling machines and devices: Secondary | ICD-10-CM

## 2021-01-11 NOTE — Patient Instructions (Signed)
It was good to see you again today.  I am glad to hear that you are CPAP is working well for you.  You are fully compliant with treatment.  Keep up the good work and please follow-up routinely in this office to see either Amy or Judson Roch, NP in about a year.    Continue using your CPAP regularly. While your insurance requires that you use CPAP at least 4 hours each night on 70% of the nights, I recommend, that you not skip any nights and use it throughout the night if you can. Getting used to CPAP and staying with the treatment long term does take time and patience and discipline. Untreated obstructive sleep apnea when it is moderate to severe can have an adverse impact on cardiovascular health and raise her risk for heart disease, arrhythmias, hypertension, congestive heart failure, stroke and diabetes. Untreated obstructive sleep apnea causes sleep disruption, nonrestorative sleep, and sleep deprivation. This can have an impact on your day to day functioning and cause daytime sleepiness and impairment of cognitive function, memory loss, mood disturbance, and problems focussing. Using CPAP regularly can improve these symptoms.

## 2021-01-11 NOTE — Progress Notes (Signed)
Subjective:    Patient ID: Jose [name]. is a 85 y.o. male.  HPI      Interim history:   Jose [name] is an 85 year old right-handed gentleman with an underlying medical history of new onset A. fib, obesity, reflux disease, recurrent TIA, hypertension, MGUS, ulnar neuropathy and arthritis, who presents for follow-up consultation of his OSA, treated with CPAP therapy. The patient is unaccompanied and presents for his yearly checkup. I last saw him on 12/21/2019, At which time he was compliant with his CPAP and doing well.  He saw Dr. Jannifer Franklin in the interim for his neuropathy on 01/14/2020.  He saw Butler Denmark, NP, on 07/19/2020, at which time he was eligible for new machine.  A new prescription for a machine was written in November 2021.  He received an AutoPap machine with default settings of 4 to 20 cm.  He called in January 2022 reporting that the pressure was too high.  His pressure was adjusted to a set pressure of 12 cm as before.  Today, 01/11/2021: I reviewed his CPAP compliance data from 12/11/2020 through 01/09/2021, which is a total of 30 days, during which time he used his machine every night with percent use days greater than 4 hours at 100%, indicating superb compliance, average usage of 7 hours and 24 minutes, residual AHI at goal at 0.2/h, leak acceptable with a 95th percentile at 16.5 L/min on a pressure of 12 cm but EPR of 3.  He reports that once the pressure was readjusted he was doing much better.  He did have some difficulty communicating with the DME company and is not pleased with his overall experience but things have at least settled for now.  He is using a nasal mask.  He has requested a different size because of some irritation on his nasal bridge from the smaller size.  He is waiting for the next supplies.  He is benefiting from treatment, he had a checkup with his primary care who is about to retire and he will have to establish care with a new primary care physician.    The patient's allergies, current medications, family history, past medical history, past social history, past surgical history and problem list were reviewed and updated as appropriate.    Previously (copied from previous notes for reference):    I saw him on 12/05/2019, at which time he was compliant with his CPAP and doing well.     I reviewed his CPAP compliance data from 11/16/2019 through 12/15/2019 which is a total of 30 days, during which time he used his machine every night with percent use days greater than 4 hours at 100%, indicating superb compliance with an average usage of 7 hours and 40 minutes, residual AHI at goal at 0.2/h, leak on the higher side with a 95th percentile at 21.8 L/min on a pressure of 12 cm with EPR of 3.      I saw him on 12/04/2017, at which time he was fully compliant with his CPAP. Of note, he had interim left shoulder replacement surgery on 12/25/2016, then had lower back surgery in September 2018, then had right shoulder replacement in January 2019.   I reviewed his CPAP compliance data from 11/03/2018 through 12/02/2018 which is a total of 30 days, during which time he used his CPAP every night with percent used days greater than 4 hours at 100%, indicating superb compliance with an average usage of 7 hours and 26 minutes, residual AHI at goal  at 0.3 per hour, leak on the high side with the 95th percentile at 29.8 L/m on a pressure of 12 cm with EPR of 3.      I saw him on 12/04/2016, at which time he reported doing well with CPAP. He was supposed to have left total shoulder replacement in February 2018.   I reviewed his CPAP compliance data from 11/03/2017 through 12/02/2017 which is a total of 30 days, during which time he used his CPAP every night with percent used days greater than 4 hours at 100%, indicating superb compliance with an average usage of 7 hours and 28 minutes, residual AHI at goal at 0.3 per hour, leak on the higher end with the 95th percentile  at 17.8 L/m on a pressure of 12 cm with EPR of 3.  I saw him on 12/05/2015, at which time we talked about his split-night sleep study results from 03/01/2015 and his compliance with CPAP. He was fully compliant with CPAP therapy. I suggested with a checkup. He saw Dr. Jannifer Franklin in the interim in March 2017. He had been treated in January 2017 for a community-acquired pneumonia. He then presented to the emergency room in January 2017 a balance issues and was diagnosed with vertigo, placed on meclizine as needed.    I reviewed his CPAP compliance data from 11/03/2016 through 12/02/2016, which is a total of 30 days, during which time he used his CPAP every night with percent used days greater than 4 hours at 100%, indicating superb compliance with an average usage of 7 hours and 33 minutes, residual AHI 0.3 per hour, leak low with the 95th percentile at 10.2 L/m on a pressure of 12 cm with EPR of 3.    I first met him on 06/01/2015 at the request of Dr. Jannifer Franklin, at which time we talked about his split-night sleep study results from 03/01/2015 as well as his compliance data. He reported doing well. He had adjusted well to treatment. He felt improved with respect to sleep disruption, daytime somnolence, and sleep quality. He had recent ulnar nerve surgery about 3 weeks prior. He was taking gabapentin for neuropathy. He was seeing a cardiologist out of [place] and was on Eliquis. He goes to bed around 11 PM and his rise time is around 7 AM. He wakes up better rested. His neuropathy seems stable to him. In the interim, he had neck surgery on 02/04/2015 under Dr. Hal Neer. He feels improved. He has a follow-up with his hand surgeon next week. He has not had any recent TIA type symptoms. He drinks alcohol occasionally. He quit smoking in the late 60s. He drinks quite a bit of coffee, at least 2 12 ounce cups a day.   I reviewed his CPAP compliance data from 11/01/2015 through 11/30/2015 which is a total of 30 days  during which time he used his machine every night with percent used days greater than 4 hours at 100%, indicating superb compliance with an average usage of 7 hours and 29 minutes, residual AHI low at 0.4 per hour, leak borderline with the 95th percentile at 24.3 L/m on a pressure of 12 cm with EPR of 3.   06/01/2015: He was referred for a sleep study secondary to a report of witnessed apneas during his hospitalization in 1/16, and snoring reported. He had a split-night sleep study on 03/01/2015 and went over his test results with him in detail today. His baseline sleep efficiency was reduced at 77% with a latency  to sleep of 5.5 minutes and wake after sleep onset of 29.5 minutes with moderate sleep fragmentation noted. He had a markedly elevated arousal index at 73.3 per hour, secondary to respiratory events. He had absence of slow-wave sleep and REM sleep prior to CPAP initiation. He had no significant PLMS, EKG or EEG changes. He had moderate snoring. He had a total AHI highly elevated at 71.3 per hour, average oxygen saturation was 93%, nadir was 83% during non-REM sleep. He was then placed on CPAP therapy. Sleep efficiency was 87.5% with a latency to sleep of 4 minutes and wake after sleep onset of 27 minutes with mild to moderate sleep fragmentation noted. He had a tremendous improvement in his arousal index to 3.6 per hour. He had 12.2% of slow-wave sleep and achieved 28.9% of REM sleep. Average oxygen saturation was 93%, nadir was 81%. He had no significant PLMS during the treatment portion of the study. CPAP was titrated from 5 cm to 13 cm with a reduction of his AHI to 2.8 per hour at a pressure of 12 cm. Based on his test results I prescribed CPAP therapy for home use.    I reviewed his CPAP compliance data from 05/01/2015 through 05/30/2015 which is a total of 30 days during which time he used his machine every night with percent used days greater than 4 hours at 93%, indicating excellent compliance  with an average usage of 6 hours and 32 minutes, residual AHI at 3.4 per hour, leak acceptable with the 95th percentile at 20.2 L/m on a pressure of 12 cm with EPR of 3.   His Past Medical History Is Significant For: Past Medical History:  Diagnosis Date  . Arthritis   . CAD (coronary artery disease)    04/15/18 65% dLAD, 20% pLAD, 30% 2nd diag, Normal EF  . Dysrhythmia    afib  . Gallstones 07/1996  . Headache    couple times a year  . History of CT scan of head 1997   small vessel disease  . History of ETT 05/2002    no EKG changes  . History of MRI of cervical spine 07/15/2002  . Hypertension   . Lumbar spondylolysis 12/17/2016  . MGUS (monoclonal gammopathy of unknown significance) 04/11/2012  . Occipital neuralgia of left side 12/09/2018  . Peripheral neuropathy   . Pneumonia 10/2014  . PONV (postoperative nausea and vomiting)    once many years ago  . Primary localized osteoarthritis of left hip 07/08/2018  . Primary localized osteoarthrosis of left shoulder 12/25/2016  . Primary localized osteoarthrosis of right shoulder 11/05/2017  . Rectal bleeding 12/2002   colonoscopy  . Seizures ([place])    last seizure 1976  . Skin cancer   . Sleep apnea    uses CPAP  . Spinal stenosis, lumbar region with neurogenic claudication   . Stroke [facility])    ?tia ?afib  . TIA (transient ischemic attack)   . 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 extremity 03/29/2015    His Past Surgical History Is Significant For: Past Surgical History:  Procedure Laterality Date  . ANTERIOR CERVICAL DECOMP/DISCECTOMY FUSION N/A 02/04/2015   Procedure: ANTERIOR CERVICAL DECOMPRESSION/DISCECTOMY FUSION CERVICAL FIVE-SIX,CERVICAL SIX-SEVEN;  Surgeon: Karie Chimera, MD;  Location: [facility];  Service: Neurosurgery;  Laterality: N/A;  . BACK SURGERY    . CATARACT EXTRACTION Bilateral   . COLONOSCOPY W/ POLYPECTOMY  04/19/2003   tubular adenoma  .  EYE SURGERY      Bilateral Cataracts  . GREAT TOE ARTHRODESIS, INTERPHALANGEAL JOINT Left 1978  . JOINT REPLACEMENT    . joint replacement on right thumb Right   . LEFT HEART CATH AND CORONARY ANGIOGRAPHY N/A 04/15/2018   Procedure: LEFT HEART CATH AND CORONARY ANGIOGRAPHY;  Surgeon: Troy Sine, MD;  Location: [facility];  Service: Cardiovascular;  Laterality: N/A;  . LUMBAR LAMINECTOMY/DECOMPRESSION MICRODISCECTOMY N/A 07/19/2017   Procedure: LAMINECTOMY AND FORAMINOTOMY  LUMBAR TWO- LUMBAR THREE, LUMBAR THREE- LUMBAR FOUR;  Surgeon: Ashok Pall, MD;  Location: [facility];  Service: Neurosurgery;  Laterality: N/A;  . nerve transfer surgery to lwft hand Left    Nov. 2017  . SHOULDER ARTHROSCOPY W/ ROTATOR CUFF REPAIR Left 15   rotator cuff   . SHOULDER INJECTION  June 2012   left, [place]  . SKIN CANCER EXCISION  07/1993   left lateral arm  . SUPERFICIAL KERATECTOMY Right 05/1998   wrist  . thumb injection  June 2012   left, [place]  . TONSILLECTOMY AND ADENOIDECTOMY    . TOTAL HIP ARTHROPLASTY Left 07/08/2018  . TOTAL HIP ARTHROPLASTY Left 07/08/2018   Procedure: LEFT TOTAL HIP ARTHROPLASTY;  Surgeon: Marchia Bond, MD;  Location: [facility];  Service: Orthopedics;  Laterality: Left;  . TOTAL SHOULDER ARTHROPLASTY Left 12/25/2016   Procedure: TOTAL SHOULDER ARTHROPLASTY;  Surgeon: Marchia Bond, MD;  Location: [facility];  Service: Orthopedics;  Laterality: Left;  . TOTAL SHOULDER ARTHROPLASTY Right 11/05/2017  . TOTAL SHOULDER ARTHROPLASTY Right 11/05/2017   Procedure: TOTAL SHOULDER ARTHROPLASTY;  Surgeon: Marchia Bond, MD;  Location: [facility];  Service: Orthopedics;  Laterality: Right;  . ulnar nerve transfer  12/1999   left  . ULNAR NERVE TRANSPOSITION Left     His Family History Is Significant For: Family History  Problem Relation Age of Onset  . Dementia Mother 33       vascular  . Transient ischemic attack Mother   . Dementia Father        after hip fracture  . Hypertension Sister        skin  cancer  . Seizures Neg Hx     His Social History Is Significant For: Social History   Socioeconomic History  . Marital status: Married    Spouse name: Jose [name]  . Number of children: 2  . Years of education: 62  . Highest education level: Not on file  Occupational History  . Occupation: retired    Fish farm manager: UNEMPLOYED  Tobacco Use  . Smoking status: Former Smoker    Packs/day: 2.00    Years: 13.00    Pack years: 26.00    Types: Cigarettes    Quit date: 03/05/1968    Years since quitting: 52.8  . Smokeless tobacco: Never Used  Vaping Use  . Vaping Use: Never used  Substance and Sexual Activity  . Alcohol use: Yes    Comment: social  . Drug use: No  . Sexual activity: Not Currently  Other Topics Concern  . Not on file  Social History Narrative   Lives with 4th wife, Jose [name], married 2010   Retired, organizational psychology   [employer]   Son, Jose [name], [name], 2 children   Daughter, Jose [name], in [place], 2 children   Patient is right handed   Patient drinks approximately 3-4 cups of caffeine daily   Social Determinants of Health   Financial Resource Strain: Not on [employer] Insecurity: Not on file  Transportation Needs: Not on file  Physical Activity: Not on file  Stress: Not on file  Social Connections: Not on file    His Allergies Are:  Allergies  Allergen Reactions  . Maprotiline Other (See Comments)    TETRACYCLIC ANTIDEPRESSANTS UNSPECIFIED REACTION (patient is unaware of allergy) TETRACYCLIC ANTIDEPRESSANTS UNSPECIFIED REACTION (patient is unaware of allergy)  . Tetracycline Hcl Itching and Rash    Rash on hands  :   His Current Medications Are:  Outpatient Encounter Medications as of 01/11/2021  Medication Sig  . amLODipine (NORVASC) 5 MG tablet TAKE 1 TABLET DAILY  . atorvastatin (LIPITOR) 40 MG tablet TAKE 1 TABLET DAILY  . ELIQUIS 5 MG TABS tablet TAKE 1 TABLET TWICE A DAY  . isosorbide mononitrate (IMDUR) 30 MG  24 hr tablet TAKE 1 TABLET TWICE A DAY (DOSE INCREASE)  . labetalol (NORMODYNE) 200 MG tablet Take 100 mg by mouth 2 (two) times daily.  Marland Kitchen losartan (COZAAR) 50 MG tablet Take 1 tablet by mouth daily.  . Multiple Vitamins-Minerals (PRESERVISION AREDS 2) CAPS Take 1 capsule by mouth 2 (two) times daily.   . nitroGLYCERIN (NITROSTAT) 0.4 MG SL tablet Place 1 tablet (0.4 mg total) under the tongue every 5 (five) minutes x 3 doses as needed for chest pain.  Marland Kitchen ROCKLATAN 0.02-0.005 % SOLN INSTILL 1 DROP INTO THE RIGHT EYE QHS   No facility-administered encounter medications on file as of 01/11/2021.  :  Review of Systems:  Out of a complete 14 point review of systems, all are reviewed and negative with the exception of these symptoms as listed below: Review of Systems  Neurological:       Here for f/u on CPAP start, reports machine is going well. Had great difficulty with DME at the beginning but has resolved.     Objective:  Neurological Exam  Physical Exam Physical Examination:   Vitals:   01/11/21 1440  BP: 134/82  Pulse: (!) 52  SpO2: 96%    General Examination: The patient is a very pleasant 85 y.o. male in no acute distress. He appears well-developed and well-nourished and well groomed.   HEENT:Normocephalic, atraumatic, pupils are equal, round and reactive to light, extraocular tracking is well preserved. Hearing is grossly intact, bilateral hearing aids in place, status post cataract surgeries.Face is symmetric with normal facial animation and normal facial sensation. Speech is clear with no dysarthria noted. There is no hypophonia. There is no lip, neck/head, jaw or voice tremor. Neckshows FROM.Oropharynx exam reveals:mildmouth dryness, adequatedental hygiene and moderateairway crowding. Mallampati is class II. Tongue protrudes centrally and palate elevates symmetrically. Tonsils are absent.   Chest:Clear to auscultation without wheezing, rhonchi or crackles  noted.  Heart:S1+S2+0, regular and normal without murmurs, rubs or gallops noted. Mild bradycardia.    Abdomen:Soft, non-tender and non-distended.  Extremities:There is no edemabilaterally.  Skin: Warm and dry without trophic changes noted.  Musculoskeletal: exam reveals no obvious new issue.   Neurologically:  Mental status: The patient is awake, alert and oriented in all 4 spheres.Hisimmediate and remote memory, attention, language skills and fund of knowledge are appropriate. There is no evidence of aphasia, agnosia, apraxia or anomia. Speech is clear with normal prosody and enunciation. Thought process is linear. Mood is normaland affect is normal.  Cranial nerves II - XII are as described above under HEENT exam.  Motor exam: Normal bulk, strength and tone is noted. There is no tremor.Romberg is not testable safely.Fine motor skills and coordination:grosslyintact. Cerebellar testing: No dysmetria or  intention tremor, no gait ataxia.  Sensory exam: intact to light touch in the upper and lower extremities.  Gait, station and balance:Hestands withmilddifficulty.Stance is wide based and posture is age-appropriate. Tandem walk isnot testable safely.  Assessmentand Plan:   In summary,Jose Simmonsis a very pleasant24 year oldmalewith an underlying medicalhistory ofA fib, GERD, TIAs, HTN, MGUS, PN, and s/p ulnar nerve surgery,History of arthritis with s/p joint replacement and back surgeries, who presents for follow-up consultation of his obstructive sleep apnea.  He has been on CPAP for years.  He has been compliant and continues to benefit with treatment.  He has a new machine which is set for CPAP of 12 cm again.  He started off with an AutoPap machine with the default settings which was not tolerable to him.  He is fully compliant with treatment and has been using a nasal interface with success.  He is commended for his treatment adherence and  advised to follow-up routinely in 1 year to see one of our nurse practitioners in sleep clinic. I answered allhis questions today and the patient was in agreement.

## 2021-02-15 ENCOUNTER — Other Ambulatory Visit (HOSPITAL_BASED_OUTPATIENT_CLINIC_OR_DEPARTMENT_OTHER): Payer: Self-pay

## 2021-02-15 DIAGNOSIS — K5901 Slow transit constipation: Secondary | ICD-10-CM | POA: Insufficient documentation

## 2021-02-15 MED ORDER — CIPROFLOXACIN HCL 500 MG PO TABS
ORAL_TABLET | ORAL | 0 refills | Status: DC
Start: 2021-02-15 — End: 2021-04-18
  Filled 2021-02-15: qty 14, 7d supply, fill #0

## 2021-02-15 MED ORDER — METRONIDAZOLE 500 MG PO TABS
ORAL_TABLET | ORAL | 0 refills | Status: DC
Start: 2021-02-15 — End: 2021-04-18
  Filled 2021-02-15: qty 21, 7d supply, fill #0

## 2021-02-21 ENCOUNTER — Other Ambulatory Visit: Payer: Self-pay | Admitting: Physician Assistant

## 2021-02-21 DIAGNOSIS — K5900 Constipation, unspecified: Secondary | ICD-10-CM

## 2021-02-21 DIAGNOSIS — K625 Hemorrhage of anus and rectum: Secondary | ICD-10-CM

## 2021-02-21 DIAGNOSIS — R1032 Left lower quadrant pain: Secondary | ICD-10-CM

## 2021-03-03 ENCOUNTER — Other Ambulatory Visit: Payer: Self-pay

## 2021-03-03 MED ORDER — NITROGLYCERIN 0.4 MG SL SUBL: SUBLINGUAL_TABLET | SUBLINGUAL | 2 refills | Status: AC | PRN

## 2021-03-16 ENCOUNTER — Ambulatory Visit
Admission: RE | Admit: 2021-03-16 | Discharge: 2021-03-16 | Disposition: A | Discharge status: Home / Self Care | Payer: Medicare Other | Source: Ambulatory Visit | Attending: Physician Assistant | Admitting: Physician Assistant

## 2021-03-16 DIAGNOSIS — K625 Hemorrhage of anus and rectum: Secondary | ICD-10-CM

## 2021-03-16 DIAGNOSIS — R1032 Left lower quadrant pain: Secondary | ICD-10-CM

## 2021-03-16 DIAGNOSIS — K5900 Constipation, unspecified: Secondary | ICD-10-CM

## 2021-03-27 ENCOUNTER — Emergency Department (HOSPITAL_BASED_OUTPATIENT_CLINIC_OR_DEPARTMENT_OTHER): Payer: Medicare Other

## 2021-03-27 ENCOUNTER — Other Ambulatory Visit: Payer: Self-pay

## 2021-03-27 ENCOUNTER — Emergency Department (HOSPITAL_BASED_OUTPATIENT_CLINIC_OR_DEPARTMENT_OTHER)
Admission: EM | Admit: 2021-03-27 | Discharge: 2021-03-27 | Disposition: A | Discharge status: Home / Self Care | Payer: Medicare Other | Attending: Emergency Medicine | Admitting: Emergency Medicine

## 2021-03-27 ENCOUNTER — Encounter (HOSPITAL_BASED_OUTPATIENT_CLINIC_OR_DEPARTMENT_OTHER): Payer: Self-pay | Admitting: *Deleted

## 2021-03-27 DIAGNOSIS — Z79899 Other long term (current) drug therapy: Secondary | ICD-10-CM | POA: Diagnosis not present

## 2021-03-27 DIAGNOSIS — Z7901 Long term (current) use of anticoagulants: Secondary | ICD-10-CM | POA: Insufficient documentation

## 2021-03-27 DIAGNOSIS — M25561 Pain in right knee: Secondary | ICD-10-CM | POA: Diagnosis not present

## 2021-03-27 DIAGNOSIS — Z87891 Personal history of nicotine dependence: Secondary | ICD-10-CM | POA: Diagnosis not present

## 2021-03-27 DIAGNOSIS — Z85828 Personal history of other malignant neoplasm of skin: Secondary | ICD-10-CM | POA: Insufficient documentation

## 2021-03-27 DIAGNOSIS — I10 Essential (primary) hypertension: Secondary | ICD-10-CM | POA: Diagnosis not present

## 2021-03-27 DIAGNOSIS — Z96611 Presence of right artificial shoulder joint: Secondary | ICD-10-CM | POA: Diagnosis not present

## 2021-03-27 DIAGNOSIS — Z96612 Presence of left artificial shoulder joint: Secondary | ICD-10-CM | POA: Diagnosis not present

## 2021-03-27 DIAGNOSIS — I2510 Atherosclerotic heart disease of native coronary artery without angina pectoris: Secondary | ICD-10-CM | POA: Diagnosis not present

## 2021-03-27 DIAGNOSIS — Z96642 Presence of left artificial hip joint: Secondary | ICD-10-CM | POA: Insufficient documentation

## 2021-03-27 NOTE — Discharge Instructions (Signed)
Please continue to wear the knee immobilizer on the right leg.  This will help keep the knee stabilized while you are walking.  Use the walker as needed.  If you develop worsening symptoms please come back to the emergency department.  Otherwise, I would recommend that he follow-up with your regular doctor or your orthopedist so that you can have an MRI of the right knee.  It was a pleasure to meet you both.

## 2021-03-27 NOTE — ED Triage Notes (Signed)
Right knee pain today after walking and feeling a crack with instant pain and loss of strength.

## 2021-03-27 NOTE — ED Provider Notes (Signed)
[facility] EMERGENCY DEPARTMENT Provider Note   CSN: [id number] Arrival date & time: 03/27/21  1600     History Chief Complaint  Patient presents with  . Knee Pain    Jose [name]. is a 85 y.o. male.  HPI Patient is an 85 year old male with a medical history as noted below.  Jose [name] presents to the emergency department due to right knee pain.  Patient states that Jose [name] was out of state last week and while driving was resting the lateral aspect of his right knee on the wall of the car.  Jose [name] states that Jose [name] was mildly irritated all week after doing this.  While ambulating earlier today through the house Jose [name] felt a "crack" and had sudden onset pain in the knee.  Jose [name] states his pain worsens when bearing weight and ambulating.  Jose [name] has only mild pain at rest.  Denies any numbness or weakness.  No falls or head trauma.    Past Medical History:  Diagnosis Date  . Arthritis   . CAD (coronary artery disease)    04/15/18 65% dLAD, 20% pLAD, 30% 2nd diag, Normal EF  . Dysrhythmia    afib  . Gallstones 07/1996  . Headache    couple times a year  . History of CT scan of head 1997   small vessel disease  . History of ETT 05/2002    no EKG changes  . History of MRI of cervical spine 07/15/2002  . Hypertension   . Lumbar spondylolysis 12/17/2016  . MGUS (monoclonal gammopathy of unknown significance) 04/11/2012  . Occipital neuralgia of left side 12/09/2018  . Peripheral neuropathy   . Pneumonia 10/2014  . PONV (postoperative nausea and vomiting)    once many years ago  . Primary localized osteoarthritis of left hip 07/08/2018  . Primary localized osteoarthrosis of left shoulder 12/25/2016  . Primary localized osteoarthrosis of right shoulder 11/05/2017  . Rectal bleeding 12/2002   colonoscopy  . Seizures ([place])    last seizure 1976  . Skin cancer   . Sleep apnea    uses CPAP  . Spinal stenosis, lumbar region with neurogenic claudication   . Stroke [facility])    ?tia ?afib  . TIA (transient  ischemic attack)   . 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 extremity 03/29/2015    Patient Active Problem List   Diagnosis Date Noted  . Occipital neuralgia of left side 12/09/2018  . Primary localized osteoarthritis of left hip 07/08/2018  . Primary localized osteoarthritis of hip 07/08/2018  . CAD (coronary artery disease) 04/16/2018  . Chest pain   . Abnormal cardiovascular stress test 04/14/2018  . Primary localized osteoarthrosis of right shoulder 11/05/2017  . Primary localized osteoarthrosis of shoulder 11/05/2017  . Lumbar stenosis with neurogenic claudication 07/19/2017  . Primary localized osteoarthrosis of left shoulder 12/25/2016  . S/P shoulder replacement 12/25/2016  . Lumbar spondylolysis 12/17/2016  . Paresthesia 01/24/2016  . 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 extremity 03/29/2015  . Cervical spinal stenosis 02/04/2015  . Bronchiectasis without acute exacerbation ([place]) 01/11/2015  . Ulnar neuropathy at elbow 12/24/2014  . TIA (transient ischemic attack) 12/15/2014  . Hyperlipidemia 11/24/2014  . Hypoxia   . PAF (paroxysmal atrial fibrillation) ([place])   . MGUS (monoclonal gammopathy of unknown significance) 04/11/2012  . SEBORRHEIC KERATOSIS 11/09/2010  . CPK, ABNORMAL 09/03/2010  . DERMATOPHYTOSIS OF FOOT 08/03/2010  . MYALGIA 12/27/2009  . GOUT,  UNSPECIFIED 10/04/2009  . HYPERTROPHY PROSTATE W/O UR OBST & OTH LUTS 12/14/2008  . ALLERGIC CONJUNCTIVITIS 12/03/2007  . TESTOSTERONE DEFICIENCY 11/14/2007  . Thoracic back pain 04/22/2007  . HYPERLIPIDEMIA 12/26/2006  . OBESITY, NOS 12/26/2006  . 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 Peripheral neuropathy 12/26/2006  . HEARING LOSS NOS OR DEAFNESS 12/26/2006  . HYPERTENSION, BENIGN SYSTEMIC 12/26/2006  . OSTEOARTHRITIS, MULTI SITES 12/26/2006  . Musculoskeletal disorder and symptoms referable to neck 12/26/2006    Past Surgical History:  Procedure Laterality  Date  . ANTERIOR CERVICAL DECOMP/DISCECTOMY FUSION N/A 02/04/2015   Procedure: ANTERIOR CERVICAL DECOMPRESSION/DISCECTOMY FUSION CERVICAL FIVE-SIX,CERVICAL SIX-SEVEN;  Surgeon: Karie Chimera, MD;  Location: [facility];  Service: Neurosurgery;  Laterality: N/A;  . BACK SURGERY    . CATARACT EXTRACTION Bilateral   . COLONOSCOPY W/ POLYPECTOMY  04/19/2003   tubular adenoma  . EYE SURGERY     Bilateral Cataracts  . GREAT TOE ARTHRODESIS, INTERPHALANGEAL JOINT Left 1978  . JOINT REPLACEMENT    . joint replacement on right thumb Right   . LEFT HEART CATH AND CORONARY ANGIOGRAPHY N/A 04/15/2018   Procedure: LEFT HEART CATH AND CORONARY ANGIOGRAPHY;  Surgeon: Troy Sine, MD;  Location: [facility];  Service: Cardiovascular;  Laterality: N/A;  . LUMBAR LAMINECTOMY/DECOMPRESSION MICRODISCECTOMY N/A 07/19/2017   Procedure: LAMINECTOMY AND FORAMINOTOMY  LUMBAR TWO- LUMBAR THREE, LUMBAR THREE- LUMBAR FOUR;  Surgeon: Ashok Pall, MD;  Location: [facility];  Service: Neurosurgery;  Laterality: N/A;  . nerve transfer surgery to lwft hand Left    Nov. 2017  . SHOULDER ARTHROSCOPY W/ ROTATOR CUFF REPAIR Left 15   rotator cuff   . SHOULDER INJECTION  June 2012   left, [place]  . SKIN CANCER EXCISION  07/1993   left lateral arm  . SUPERFICIAL KERATECTOMY Right 05/1998   wrist  . thumb injection  June 2012   left, [place]  . TONSILLECTOMY AND ADENOIDECTOMY    . TOTAL HIP ARTHROPLASTY Left 07/08/2018  . TOTAL HIP ARTHROPLASTY Left 07/08/2018   Procedure: LEFT TOTAL HIP ARTHROPLASTY;  Surgeon: Marchia Bond, MD;  Location: [facility];  Service: Orthopedics;  Laterality: Left;  . TOTAL SHOULDER ARTHROPLASTY Left 12/25/2016   Procedure: TOTAL SHOULDER ARTHROPLASTY;  Surgeon: Marchia Bond, MD;  Location: [place];  Service: Orthopedics;  Laterality: Left;  . TOTAL SHOULDER ARTHROPLASTY Right 11/05/2017  . TOTAL SHOULDER ARTHROPLASTY Right 11/05/2017   Procedure: TOTAL SHOULDER ARTHROPLASTY;  Surgeon: Marchia Bond, MD;  Location: [place];  Service: Orthopedics;  Laterality: Right;  . ulnar nerve transfer  12/1999   left  . ULNAR NERVE TRANSPOSITION Left        Family History  Problem Relation Age of Onset  . Dementia Mother 11       vascular  . Transient ischemic attack Mother   . Dementia Father        after hip fracture  . Hypertension Sister        skin cancer  . Seizures Neg Hx     Social History   Tobacco Use  . Smoking status: Former Smoker    Packs/day: 2.00    Years: 13.00    Pack years: 26.00    Types: Cigarettes    Quit date: 03/05/1968    Years since quitting: 53.0  . Smokeless tobacco: Never Used  Vaping Use  . Vaping Use: Never used  Substance Use Topics  . Alcohol use: Yes    Comment: social  . Drug use: No  Home Medications Prior to Admission medications   Medication Sig Start Date End Date Taking? Authorizing Provider  amLODipine (NORVASC) 5 MG tablet TAKE 1 TABLET DAILY 06/08/19   [provider]  amoxicillin-clavulanate (AUGMENTIN) 875-125 MG tablet TAKE 1 TABLET BY MOUTH 2 TIMES DAILY FOR 10 DAYS. 08/02/20 08/02/21  Karlene Einstein, MD  atorvastatin (LIPITOR) 40 MG tablet TAKE 1 TABLET DAILY 08/03/20   Burnell Blanks, MD  ciprofloxacin (CIPRO) 500 MG tablet Take 1 tablet (500 mg total) by mouth 2 times daily for 7 days. 02/15/21     ELIQUIS 5 MG TABS tablet TAKE 1 TABLET TWICE A DAY 12/26/20   Kathrynn Ducking, MD  isosorbide mononitrate (IMDUR) 30 MG 24 hr tablet TAKE 1 TABLET TWICE A DAY (DOSE INCREASE) 11/08/20   Burnell Blanks, MD  labetalol (NORMODYNE) 200 MG tablet Take 100 mg by mouth 2 (two) times daily.    [provider]  losartan (COZAAR) 50 MG tablet Take 1 tablet by mouth daily. 02/25/20   [provider]  metroNIDAZOLE (FLAGYL) 500 MG tablet Take 1 tablet (500 mg total) by mouth 3 times daily for 7 days. 02/15/21     Multiple Vitamins-Minerals (PRESERVISION AREDS 2) CAPS Take 1 capsule by mouth 2 (two)  times daily.     [provider]  nitroGLYCERIN (NITROSTAT) 0.4 MG SL tablet PLACE 1 TABLET (0.4 MG TOTAL) UNDER THE TONGUE EVERY 5 (FIVE) MINUTES X 3 DOSES AS NEEDED FOR CHEST PAIN. 03/03/21   Burnell Blanks, MD  oxyCODONE (OXY IR/ROXICODONE) 5 MG immediate release tablet TAKE 1 TABLET BY MOUTH EVERY 4 HOURS AS NEEDED FOR PAIN 12/01/20 05/30/21  Erle Crocker, MD  ROCKLATAN 0.02-0.005 % SOLN INSTILL 1 DROP INTO THE RIGHT EYE QHS 04/10/19   [provider]    Allergies    Maprotiline and Tetracycline hcl  Review of Systems   Review of Systems  Musculoskeletal: Positive for arthralgias. Negative for joint swelling.  Skin: Negative for color change and wound.  Neurological: Negative for syncope and headaches.   Physical Exam Updated Vital Signs BP 140/90 (BP Location: Left Arm)   Pulse (!) 56   Temp 98 F (36.7 C) (Oral)   Resp 20   Ht 6' 1.5" (1.867 m)   Wt 97.7 kg   SpO2 97%   BMI 28.03 kg/m   Physical Exam Vitals and nursing note reviewed.  Constitutional:      General: Jose [name] is not in acute distress.    Appearance: Jose [name] is well-developed.  HENT:     Head: Normocephalic and atraumatic.     Right Ear: External ear normal.     Left Ear: External ear normal.  Eyes:     General: No scleral icterus.       Right eye: No discharge.        Left eye: No discharge.     Conjunctiva/sclera: Conjunctivae normal.  Neck:     Trachea: No tracheal deviation.  Cardiovascular:     Rate and Rhythm: Normal rate.  Pulmonary:     Effort: Pulmonary effort is normal. No respiratory distress.     Breath sounds: No stridor.  Abdominal:     General: There is no distension.  Musculoskeletal:        General: Tenderness present. No swelling or deformity.     Cervical back: Neck supple.     Comments: Right leg: Mild tenderness noted along the lateral joint line of the knee.  Mild joint  effusion noted.  Full passive range of motion of the right knee without pain.  Negative  anterior/posterior drawer.  No laxity noted with varus and valgus stress.  Distal sensation is intact.  2+ pedal pulses.  Patient is able to go from a lying position to a standing position without assistance.  Skin:    General: Skin is warm and dry.     Findings: No rash.  Neurological:     Mental Status: Jose [name] is alert.     Cranial Nerves: Cranial nerve deficit: no gross deficits.    ED Results / Procedures / Treatments   Labs (all labs ordered are listed, but only abnormal results are displayed) Labs Reviewed - No data to display  EKG None  Radiology DG Knee Complete 4 Views Right  Result Date: 03/27/2021 CLINICAL DATA:  Right knee pain. EXAM: RIGHT KNEE - COMPLETE 4+ VIEW COMPARISON:  None. FINDINGS: No evidence of fracture, or dislocation. Suprapatellar joint effusion. No significant arthropathy. Soft tissues are unremarkable. IMPRESSION: 1. No acute fracture or dislocation identified about the right knee. 2. Suprapatellar joint effusion. In the settings of acute onset of symptoms, MRI of the knee may be considered to exclude occult fracture or ligamentous injury. Electronically Signed   By: Fidela Salisbury M.D.   On: 03/27/2021 17:21   Procedures Procedures   Medications Ordered in ED Medications - No data to display  ED Course  I have reviewed the triage vital signs and the nursing notes.  Pertinent labs & imaging results that were available during my care of the patient were reviewed by me and considered in my medical decision making (see chart for details).    MDM Rules/Calculators/A&P                          Pt is a 85 y.o. male who presents to the emergency department due to right knee pain.  Imaging: X-rays of the right knee show no acute fracture or dislocation identified about the right knee.  They do note a suprapatellar joint effusion.  In the settings of acute onset of symptoms they recommend MRI of the knee to exclude occult fracture or ligamentous  injury.  I, Rayna Sexton, PA-C, personally reviewed and evaluated these images and lab results as part of my medical decision-making.  Imaging findings discussed with the patient.  Jose [name] was placed in a knee immobilizer.  Jose [name] has a walker at home that Jose [name] can begin using.  Jose [name] has an appointment with his orthopedist, Dr. Mardelle Matte, in 1 week.  I recommended that Jose [name] follow-up with him for reevaluation as well as a possible MRI of the knee.  I additionally sent a secure chat to Dr. Mardelle Matte regarding the patient's status.  Both Jose [name] and his wife feel comfortable being discharged home with the knee immobilizer as well as his walker.  Feel that this is reasonable as well.  Jose [name] was discussed with and evaluated by my attending physician as well.  Discussed return precautions in length.  His questions were answered and Jose [name] was amicable at the time of discharge.  Note: Portions of this report may have been transcribed using voice recognition software. Every effort was made to ensure accuracy; however, inadvertent computerized transcription errors may be present.   Final Clinical Impression(s) / ED Diagnoses Final diagnoses:  Acute pain of right knee   Rx / DC Orders ED Discharge Orders         Ordered  For home use only DME Walker  Status:  Canceled        03/27/21 1801           Rayna Sexton, PA-C 03/27/21 1819    Wyvonnia Dusky, MD 03/28/21 [phone]

## 2021-04-14 ENCOUNTER — Other Ambulatory Visit (HOSPITAL_BASED_OUTPATIENT_CLINIC_OR_DEPARTMENT_OTHER): Payer: Self-pay

## 2021-04-17 ENCOUNTER — Other Ambulatory Visit (HOSPITAL_BASED_OUTPATIENT_CLINIC_OR_DEPARTMENT_OTHER): Payer: Self-pay

## 2021-04-17 MED ORDER — TOBRAMYCIN 0.3 % OP SOLN
OPHTHALMIC | 5 refills | Status: DC
  Filled 2021-04-17: qty 5, 25d supply, fill #0

## 2021-04-18 ENCOUNTER — Other Ambulatory Visit: Payer: Self-pay

## 2021-04-18 ENCOUNTER — Encounter: Payer: Self-pay | Admitting: Family Medicine

## 2021-04-18 ENCOUNTER — Telehealth: Payer: Self-pay | Admitting: Neurology

## 2021-04-18 ENCOUNTER — Ambulatory Visit (INDEPENDENT_AMBULATORY_CARE_PROVIDER_SITE_OTHER): Payer: Medicare Other | Admitting: Family Medicine

## 2021-04-18 DIAGNOSIS — I480 Paroxysmal atrial fibrillation: Secondary | ICD-10-CM | POA: Diagnosis not present

## 2021-04-18 DIAGNOSIS — I10 Essential (primary) hypertension: Secondary | ICD-10-CM

## 2021-04-18 DIAGNOSIS — R413 Other amnesia: Secondary | ICD-10-CM | POA: Insufficient documentation

## 2021-04-18 DIAGNOSIS — E785 Hyperlipidemia, unspecified: Secondary | ICD-10-CM | POA: Diagnosis not present

## 2021-04-18 DIAGNOSIS — I2510 Atherosclerotic heart disease of native coronary artery without angina pectoris: Secondary | ICD-10-CM

## 2021-04-18 NOTE — Telephone Encounter (Signed)
Pt's wife, Mohammed Kindle called, what prescription did physician prescribe for air pressure. Would like a call from the nurse.

## 2021-04-18 NOTE — Patient Instructions (Signed)
Good to see you today - Thank you for coming in  Things we discussed today:  Your goal blood pressure is less than 140/90.  Check your blood pressure several times a week.  If regularly higher than this please let me know - either with MyChart or leaving a phone message. Next visit please bring in your blood pressure cuff.     Please always bring your medication bottles  Come back to see me in 2-3 months

## 2021-04-19 LAB — CMP14+EGFR
ALT: 44 IU/L (ref 0–44)
AST: 27 IU/L (ref 0–40)
Albumin/Globulin Ratio: 2.1 (ref 1.2–2.2)
Albumin: 4.2 g/dL (ref 3.6–4.6)
Alkaline Phosphatase: 63 IU/L (ref 44–121)
BUN/Creatinine Ratio: 18 (ref 10–24)
BUN: 15 mg/dL (ref 8–27)
Bilirubin Total: 0.6 mg/dL (ref 0.0–1.2)
CO2: 24 mmol/L (ref 20–29)
Calcium: 9.4 mg/dL (ref 8.6–10.2)
Chloride: 104 mmol/L (ref 96–106)
Creatinine, Ser: 0.83 mg/dL (ref 0.76–1.27)
Globulin, Total: 2 g/dL (ref 1.5–4.5)
Glucose: 92 mg/dL (ref 65–99)
Potassium: 4.3 mmol/L (ref 3.5–5.2)
Sodium: 142 mmol/L (ref 134–144)
Total Protein: 6.2 g/dL (ref 6.0–8.5)
eGFR: 87 mL/min/{1.73_m2} (ref >59)

## 2021-04-19 LAB — CBC
Hematocrit: 39.4 % (ref 37.5–51.0)
Hemoglobin: 13.4 g/dL (ref 13.0–17.7)
MCH: 32.8 pg (ref 26.6–33.0)
MCHC: 34 g/dL (ref 31.5–35.7)
MCV: 97 fL (ref 79–97)
Platelets: 185 10*3/uL (ref 150–450)
RBC: 4.08 x10E6/uL — ABNORMAL LOW (ref 4.14–5.80)
RDW: 13.1 % (ref 11.6–15.4)
WBC: 7.2 10*3/uL (ref 3.4–10.8)

## 2021-04-19 LAB — TSH: TSH: 1.19 u[IU]/mL (ref 0.450–4.500)

## 2021-04-19 LAB — LIPID PANEL
Chol/HDL Ratio: 2.3 ratio (ref 0.0–5.0)
Cholesterol, Total: 126 mg/dL (ref 100–199)
HDL: 56 mg/dL (ref >39)
LDL Chol Calc (NIH): 55 mg/dL (ref 0–99)
Triglycerides: 77 mg/dL (ref 0–149)
VLDL Cholesterol Cal: 15 mg/dL (ref 5–40)

## 2021-04-19 LAB — B12 AND FOLATE PANEL
Folate: 9.9 ng/mL (ref >3.0)
Vitamin B-12: 426 pg/mL (ref 232–1245)

## 2021-04-19 NOTE — Assessment & Plan Note (Signed)
Stable on statin.  Check lab

## 2021-04-19 NOTE — Assessment & Plan Note (Signed)
Stable by history.  Check cbc since on asa and DOAC

## 2021-04-19 NOTE — Progress Notes (Signed)
    SUBJECTIVE:   CHIEF COMPLAINT / HPI:   New patient visit.  His PCP is retiring.  Was at [facility] years ago   Medications reviewed.  He uses a 2 week pillbox.  Did not bring in his medications  Hypertension Has home blood pressure monitor but does not check regularly.  Amlodipine dailyt  Afib Paroxysmal Eliquis twice a day.  No bleeding.  Sees Dr Julianne Handler Cardiology  TIA Takes ASA one daily.  Last was years ago   PERTINENT  PMH / PSH: Used to walk daily before R knee injury 2 weeks agao  OBJECTIVE:   BP 134/72   Pulse 62   Wt 212 lb 9.6 oz (96.4 kg)   SpO2 95%   BMI 27.67 kg/m   Wearing knee brace on R Mobility:able to get up and down from exam table without assistance or distress Heart - Regular rate and rhythm.  No murmurs, gallops or rubs.    Lungs:  Normal respiratory effort, chest expands symmetrically. Lungs are clear to auscultation, no crackles or wheezes. No edema    ASSESSMENT/PLAN:   HYPERTENSION, BENIGN SYSTEMIC BP Readings from Last 3 Encounters:  04/18/21 134/72  03/27/21 140/90  01/11/21 134/82   At goal.  Continue current medications. Will check labs  PAF (paroxysmal atrial fibrillation) ([place]) Stable by history.  Check cbc since on asa and DOAC  HYPERLIPIDEMIA Stable on statin.  Check lab      Lind Covert, [place]

## 2021-04-19 NOTE — Telephone Encounter (Signed)
I returned pt's wife call and advised current pressure for pt is 12 cm of pressure. She confirmed this as correct and voiced appreciation for the call.

## 2021-04-19 NOTE — Assessment & Plan Note (Signed)
BP Readings from Last 3 Encounters:  04/18/21 134/72  03/27/21 140/90  01/11/21 134/82   At goal.  Continue current medications. Will check labs

## 2021-04-20 ENCOUNTER — Telehealth: Payer: Self-pay

## 2021-04-20 NOTE — Telephone Encounter (Signed)
   [facility] Pre-operative Risk Assessment    Patient Name: Jose [name].  DOB: 22-Jun-1936  MRN: [id number]   HEARTCARE STAFF: - Please ensure there is not already an duplicate clearance open for this procedure. - Under Visit Info/Reason for Call, type in Other and utilize the format Clearance MM/DD/YY or Clearance TBD. Do not use dashes or single digits. - If request is for dental extraction, please clarify the # of teeth to be extracted. - If the patient is currently at the dentist's office, call Pre-Op APP to address. If the patient is not currently in the dentist office, please route to the Pre-Op pool  Request for surgical clearance:  What type of surgery is being performed? Right Knee Scope   When is this surgery scheduled? TBD    What type of clearance is required (medical clearance vs. Pharmacy clearance to hold med vs. Both)? Pharmacy  Are there any medications that need to be held prior to surgery and how long? Eliquis   Practice name and name of physician performing surgery? [name] Dr Jose [name]   What is the office phone number? [place] contact person Jose [name]   7.   What is the office fax number? [phone]  8.   Anesthesia type (None, local, MAC, general) ? None specified    Jose [name] 04/20/2021, 3:32 PM  _________________________________________________________________   (provider comments below)

## 2021-04-21 ENCOUNTER — Other Ambulatory Visit: Payer: Self-pay | Admitting: Pharmacist

## 2021-04-21 MED ORDER — ELIQUIS 5 MG PO TABS: 1.0000 | ORAL_TABLET | Freq: Two times a day (BID) | ORAL | 1 refills | Status: DC

## 2021-04-21 NOTE — Telephone Encounter (Signed)
Patient with diagnosis of afib on Eliquis for anticoagulation.    Procedure: Right Knee Scope  Date of procedure: TBD  CHA2DS2-VASc Score = 6  This indicates a 9.7% annual risk of stroke. The patient's score is based upon: CHF History: No HTN History: Yes Diabetes History: No Stroke History: Yes Vascular Disease History: Yes Age Score: 2 Gender Score: 0   CrCl 44mL/min Platelet count 185K  Recommend limiting time off anticoag as much as possible given pt's elevated cardiac risk. Would see if MD performing surgery is ok with 1 day Eliquis hold. If longer hold is needed, will need clearance from Dr Angelena Form.

## 2021-04-21 NOTE — Telephone Encounter (Signed)
   Primary Cardiologist: Lauree Chandler, MD  Chart reviewed as part of pre-operative protocol coverage. Given past medical history and time since last visit, based on ACC/AHA guidelines, Jose [name]. would be at acceptable risk for the planned procedure without further cardiovascular testing.   Patient with diagnosis of afib on Eliquis for anticoagulation.     Procedure: Right Knee Scope  Date of procedure: TBD   CHA2DS2-VASc Score = 6  This indicates a 9.7% annual risk of stroke. The patient's score is based upon: CHF History: No HTN History: Yes Diabetes History: No Stroke History: Yes Vascular Disease History: Yes Age Score: 2 Gender Score: 0    CrCl 57mL/min Platelet count 185K   Recommend limiting time off anticoag as much as possible given pt's elevated cardiac risk.  His Eliquis may be held for 1 day prior to his procedure.   If longer hold is needed, will need clearance from Dr Angelena Form.  I will route this recommendation to the requesting party via Epic fax function and remove from pre-op pool.  Please call with questions.  Jossie Ng. Asyia Hornung NP-C    04/21/2021, 12:29 PM [facility] [place] [address] Office [phone] Fax [phone]

## 2021-04-25 ENCOUNTER — Telehealth: Payer: Self-pay | Admitting: Family Medicine

## 2021-04-25 ENCOUNTER — Telehealth: Payer: Self-pay | Admitting: Neurology

## 2021-04-25 NOTE — Telephone Encounter (Signed)
Pt calling stating his surgeon received the clearance form however the box stating whether or not he is cleared to have his knee surgery was not checked. Pt is asking for call back.

## 2021-04-25 NOTE — Telephone Encounter (Signed)
I called pt and he reports he is trying to get clearance for knee surgery and was advised Percell Miller and Noemi Chapel had sent pp work to possibly Dr. Aletha Halim.  I have not seen pps in regards to this on Dr. Rexene Alberts side.  Will fwd to Dr. Jannifer Franklin nurse to further investigate. If pp has not been received we may need to request again from [place].

## 2021-04-25 NOTE — Telephone Encounter (Signed)
Found paperwork, Dr. Jannifer Franklin signed and released patient for surgery with OK to stop ASA/Eliquis 6 days prior to surgey.  LVM on [employer] VM at [employer]

## 2021-04-25 NOTE — Telephone Encounter (Signed)
Patient is calling to check on the status of his surgical clearance being completed and faxed back to [facility]. This form has to be completed for him to schedule his knee surgery.

## 2021-04-27 NOTE — Telephone Encounter (Signed)
Pls let patient know I completed the preop form and faxed it yesterday  Thanks  [place]

## 2021-04-27 NOTE — Telephone Encounter (Signed)
Patient calls nurse line checking the status of surgical clearance form. I advised patient this was faxed yesterday, however he stated he spoke to them this am and they still have not received it. I pulled from fax pile and resent. I called the surgery coordinator and LVM to call me back to confirm she received or not.

## 2021-04-28 ENCOUNTER — Other Ambulatory Visit: Payer: Self-pay | Admitting: Cardiovascular Disease

## 2021-05-18 ENCOUNTER — Other Ambulatory Visit (HOSPITAL_BASED_OUTPATIENT_CLINIC_OR_DEPARTMENT_OTHER): Payer: Self-pay

## 2021-05-18 MED ORDER — TRAMADOL HCL 50 MG PO TABS
ORAL_TABLET | ORAL | 0 refills | Status: DC
  Filled 2021-05-18: qty 10, 3d supply, fill #0

## 2021-06-02 ENCOUNTER — Other Ambulatory Visit (HOSPITAL_BASED_OUTPATIENT_CLINIC_OR_DEPARTMENT_OTHER): Payer: Self-pay

## 2021-06-02 MED ORDER — LORATADINE 10 MG PO TABS: ORAL_TABLET | ORAL | 0 refills | Status: DC

## 2021-06-26 ENCOUNTER — Other Ambulatory Visit (HOSPITAL_BASED_OUTPATIENT_CLINIC_OR_DEPARTMENT_OTHER): Payer: Self-pay

## 2021-06-26 MED ORDER — CEPHALEXIN 500 MG PO CAPS
ORAL_CAPSULE | ORAL | 0 refills | Status: DC
  Filled 2021-06-26: qty 21, 7d supply, fill #0

## 2021-08-22 ENCOUNTER — Emergency Department (HOSPITAL_COMMUNITY)
Admission: EM | Admit: 2021-08-22 | Discharge: 2021-08-22 | Disposition: A | Discharge status: Home / Self Care | Payer: Medicare Other | Attending: Emergency Medicine | Admitting: Emergency Medicine

## 2021-08-22 ENCOUNTER — Other Ambulatory Visit: Payer: Self-pay

## 2021-08-22 ENCOUNTER — Encounter (HOSPITAL_COMMUNITY): Payer: Self-pay

## 2021-08-22 DIAGNOSIS — Z7982 Long term (current) use of aspirin: Secondary | ICD-10-CM | POA: Insufficient documentation

## 2021-08-22 DIAGNOSIS — Z87891 Personal history of nicotine dependence: Secondary | ICD-10-CM | POA: Diagnosis not present

## 2021-08-22 DIAGNOSIS — X509XXA Other and unspecified overexertion or strenuous movements or postures, initial encounter: Secondary | ICD-10-CM | POA: Diagnosis not present

## 2021-08-22 DIAGNOSIS — Z7901 Long term (current) use of anticoagulants: Secondary | ICD-10-CM | POA: Insufficient documentation

## 2021-08-22 DIAGNOSIS — R1031 Right lower quadrant pain: Secondary | ICD-10-CM

## 2021-08-22 DIAGNOSIS — S3991XA Unspecified injury of abdomen, initial encounter: Secondary | ICD-10-CM | POA: Diagnosis not present

## 2021-08-22 DIAGNOSIS — I2510 Atherosclerotic heart disease of native coronary artery without angina pectoris: Secondary | ICD-10-CM | POA: Insufficient documentation

## 2021-08-22 DIAGNOSIS — Z96612 Presence of left artificial shoulder joint: Secondary | ICD-10-CM | POA: Insufficient documentation

## 2021-08-22 DIAGNOSIS — Z96611 Presence of right artificial shoulder joint: Secondary | ICD-10-CM | POA: Insufficient documentation

## 2021-08-22 DIAGNOSIS — Z85828 Personal history of other malignant neoplasm of skin: Secondary | ICD-10-CM | POA: Insufficient documentation

## 2021-08-22 DIAGNOSIS — I10 Essential (primary) hypertension: Secondary | ICD-10-CM | POA: Insufficient documentation

## 2021-08-22 DIAGNOSIS — Z96691 Finger-joint replacement of right hand: Secondary | ICD-10-CM | POA: Diagnosis not present

## 2021-08-22 DIAGNOSIS — Z96642 Presence of left artificial hip joint: Secondary | ICD-10-CM | POA: Diagnosis not present

## 2021-08-22 DIAGNOSIS — Z79899 Other long term (current) drug therapy: Secondary | ICD-10-CM | POA: Insufficient documentation

## 2021-08-22 MED ORDER — BACLOFEN 10 MG PO TABS: 10.0000 mg | ORAL_TABLET | Freq: Three times a day (TID) | ORAL | 0 refills | Status: DC

## 2021-08-22 MED ORDER — HYDROCODONE-ACETAMINOPHEN 5-325 MG PO TABS
1.0000 | ORAL_TABLET | Freq: Once | ORAL | Status: AC
  Administered 2021-08-22: 1 via ORAL
  Filled 2021-08-22: qty 1

## 2021-08-22 MED ORDER — HYDROCODONE-ACETAMINOPHEN 5-325 MG PO TABS: 1.0000 | ORAL_TABLET | Freq: Four times a day (QID) | ORAL | 0 refills | Status: AC | PRN

## 2021-08-22 NOTE — Discharge Instructions (Addendum)
You likely have a tendon or muscle disruption in your right groin.  I will send in a muscle relaxer and hydrocodone for 3 days.  Call Dr. Mardelle Matte as soon as possible to schedule a clinic follow-up.  Your range of motion is intact.  I can reproduce your pain with bending your knee and turning your leg inwards which likely reinforces that this is a musculoskeletal issue.  You have good pulses in both of your legs. If your pain significantly worsens, you are unable to walk, develop difficulty urinating please return to the emergency room immediately.

## 2021-08-22 NOTE — ED Provider Notes (Signed)
[facility] DEPT Provider Note   CSN: [id number] Arrival date & time: 08/22/21  1124     History Chief Complaint  Patient presents with   Groin Pain   Leg Pain    Jose [name]. is a 85 y.o. male.  85 year old male presents today for evaluation of right groin pain onset this morning while he was ambulating.  He denies any trauma or injury.  He did not hear or feel a pop.  He reports he had sudden onset of severe pain that caused him to hop on 1 leg to his bed.  He has been able to ambulate with use of a cane since.  He has been able to urinate since the incident.  He denies any scrotal pain or swelling.  He is without fever, abdominal pain, back pain, radiation of this pain.  He has history of A. fib on Eliquis, and has had left hip replacement as well as bilateral shoulder replacement and is followed by Dr. Mardelle Matte.  He has not tried anything over-the-counter for pain control.  Pain is worsened by movement.  The history is provided by the patient. No language interpreter was used.      Past Medical History:  Diagnosis Date   Arthritis    CAD (coronary artery disease)    04/15/18 65% dLAD, 20% pLAD, 30% 2nd diag, Normal EF   Dysrhythmia    afib   Gallstones 07/1996   Headache    couple times a year   History of CT scan of head 1997   small vessel disease   History of ETT 05/2002    no EKG changes   History of MRI of cervical spine 07/15/2002   Hypertension    Lumbar spondylolysis 12/17/2016   MGUS (monoclonal gammopathy of unknown significance) 04/11/2012   Occipital neuralgia of left side 12/09/2018   Peripheral neuropathy    Pneumonia 10/2014   PONV (postoperative nausea and vomiting)    once many years ago   Primary localized osteoarthritis of left hip 07/08/2018   Primary localized osteoarthrosis of left shoulder 12/25/2016   Primary localized osteoarthrosis of right shoulder 11/05/2017   Rectal bleeding 12/2002   colonoscopy   Seizures  ([place])    last seizure 1976   Skin cancer    Sleep apnea    uses CPAP   Spinal stenosis, lumbar region with neurogenic claudication    Stroke [facility])    ?tia ?afib   TIA (transient ischemic attack)    Ulnar neuropathy at elbow 12/24/2014   Left   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 extremity 03/29/2015    Patient Active Problem List   Diagnosis Date Noted   Memory loss 04/18/2021   Occipital neuralgia of left side 12/09/2018   Primary localized osteoarthritis of left hip 07/08/2018   Primary localized osteoarthritis of hip 07/08/2018   CAD (coronary artery disease) 04/16/2018   Primary localized osteoarthrosis of right shoulder 11/05/2017   Primary localized osteoarthrosis of shoulder 11/05/2017   Lumbar stenosis with neurogenic claudication 07/19/2017   Primary localized osteoarthrosis of left shoulder 12/25/2016   S/P shoulder replacement 12/25/2016   Lumbar spondylolysis 12/17/2016   Paresthesia 01/24/2016   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 extremity 03/29/2015   Cervical spinal stenosis 02/04/2015   Bronchiectasis without acute exacerbation ([place]) 01/11/2015   TIA (transient ischemic attack) 12/15/2014   Hyperlipidemia 11/24/2014   PAF (paroxysmal atrial fibrillation) (HCC)    MGUS (monoclonal gammopathy of unknown significance)  04/11/2012   GOUT, UNSPECIFIED 10/04/2009   HYPERTROPHY PROSTATE W/O UR OBST & OTH LUTS 12/14/2008   ALLERGIC CONJUNCTIVITIS 12/03/2007   TESTOSTERONE DEFICIENCY 11/14/2007   Thoracic back pain 04/22/2007   HYPERLIPIDEMIA 12/26/2006   OBESITY, NOS 12/26/2006   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   Peripheral neuropathy 12/26/2006   HEARING LOSS NOS OR DEAFNESS 12/26/2006   HYPERTENSION, BENIGN SYSTEMIC 12/26/2006   OSTEOARTHRITIS, MULTI SITES 12/26/2006   Musculoskeletal disorder and symptoms referable to neck 12/26/2006    Past Surgical History:  Procedure Laterality Date   ANTERIOR CERVICAL DECOMP/DISCECTOMY FUSION N/A 02/04/2015    Procedure: ANTERIOR CERVICAL DECOMPRESSION/DISCECTOMY FUSION CERVICAL FIVE-SIX,CERVICAL SIX-SEVEN;  Surgeon: Karie Chimera, MD;  Location: [facility];  Service: Neurosurgery;  Laterality: N/A;   BACK SURGERY     CATARACT EXTRACTION Bilateral    COLONOSCOPY W/ POLYPECTOMY  04/19/2003   tubular adenoma   EYE SURGERY     Bilateral Cataracts   GREAT TOE ARTHRODESIS, INTERPHALANGEAL JOINT Left 1978   JOINT REPLACEMENT     joint replacement on right thumb Right    LEFT HEART CATH AND CORONARY ANGIOGRAPHY N/A 04/15/2018   Procedure: LEFT HEART CATH AND CORONARY ANGIOGRAPHY;  Surgeon: Troy Sine, MD;  Location: [facility];  Service: Cardiovascular;  Laterality: N/A;   LUMBAR LAMINECTOMY/DECOMPRESSION MICRODISCECTOMY N/A 07/19/2017   Procedure: LAMINECTOMY AND FORAMINOTOMY  LUMBAR TWO- LUMBAR THREE, LUMBAR THREE- LUMBAR FOUR;  Surgeon: Ashok Pall, MD;  Location: [facility];  Service: Neurosurgery;  Laterality: N/A;   nerve transfer surgery to lwft hand Left    Nov. 2017   SHOULDER ARTHROSCOPY W/ ROTATOR CUFF REPAIR Left 15   rotator cuff    SHOULDER INJECTION  June 2012   left, [facility]   SKIN CANCER EXCISION  07/1993   left lateral arm   SUPERFICIAL KERATECTOMY Right 05/1998   wrist   thumb injection  June 2012   left, [facility]   TONSILLECTOMY AND ADENOIDECTOMY     TOTAL HIP ARTHROPLASTY Left 07/08/2018   TOTAL HIP ARTHROPLASTY Left 07/08/2018   Procedure: LEFT TOTAL HIP ARTHROPLASTY;  Surgeon: Marchia Bond, MD;  Location: [facility];  Service: Orthopedics;  Laterality: Left;   TOTAL SHOULDER ARTHROPLASTY Left 12/25/2016   Procedure: TOTAL SHOULDER ARTHROPLASTY;  Surgeon: Marchia Bond, MD;  Location: [facility];  Service: Orthopedics;  Laterality: Left;   TOTAL SHOULDER ARTHROPLASTY Right 11/05/2017   TOTAL SHOULDER ARTHROPLASTY Right 11/05/2017   Procedure: TOTAL SHOULDER ARTHROPLASTY;  Surgeon: Marchia Bond, MD;  Location: [facility];  Service: Orthopedics;  Laterality: Right;   ulnar nerve  transfer  12/1999   left   ULNAR NERVE TRANSPOSITION Left        Family History  Problem Relation Age of Onset   Dementia Mother 52       vascular   Transient ischemic attack Mother    Dementia Father        after hip fracture   Hypertension Sister        skin cancer   Seizures Neg Hx     Social History   Tobacco Use   Smoking status: Former    Packs/day: 2.00    Years: 13.00    Pack years: 26.00    Types: Cigarettes    Quit date: 03/05/1968    Years since quitting: 53.5   Smokeless tobacco: Never  Vaping Use   Vaping Use: Never used  Substance Use Topics   Alcohol use: Yes    Comment: social   Drug use:  No    Home Medications Prior to Admission medications   Medication Sig Start Date End Date Taking? Authorizing Provider  baclofen (LIORESAL) 10 MG tablet Take 1 tablet (10 mg total) by mouth 3 (three) times daily. 08/22/21  Yes Deatra Canter, Hydie Langan, PA-C  HYDROcodone-acetaminophen (NORCO/VICODIN) 5-325 MG tablet Take 1 tablet by mouth every 6 (six) hours as needed for up to 3 days. 08/22/21 08/25/21 Yes Vandora Jaskulski, PA-C  acetaminophen (TYLENOL) 650 MG CR tablet 2 tablets as needed    [provider]  amLODipine (NORVASC) 5 MG tablet TAKE 1 TABLET DAILY 06/08/19   [provider]  apixaban (ELIQUIS) 5 MG TABS tablet Take 1 tablet (5 mg total) by mouth 2 (two) times daily. 04/21/21   Burnell Blanks, MD  aspirin 81 MG chewable tablet 1 tab    [provider]  atorvastatin (LIPITOR) 80 MG tablet 1 tablet    [provider]  cephALEXin (KEFLEX) 500 MG capsule Take 1 (one) capsule by mouth three times daily 06/26/21     isosorbide dinitrate (ISORDIL) 30 MG tablet 1 tablet    [provider]  isosorbide mononitrate (IMDUR) 30 MG 24 hr tablet TAKE 1 TABLET TWICE A DAY (DOSE INCREASE) 11/08/20   Burnell Blanks, MD  labetalol (NORMODYNE) 200 MG tablet Take 100 mg by mouth 2 (two) times daily.    [provider]   latanoprost (XALATAN) 0.005 % ophthalmic solution 1 drop into affected eye in the evening    [provider]  loratadine (CLARITIN) 10 MG tablet Take 1 tablet (10 mg total) by mouth daily for 14 days. 06/02/21     losartan (COZAAR) 50 MG tablet Take 1 tablet by mouth daily. 02/25/20   [provider]  Multiple Vitamins-Minerals (PRESERVISION AREDS 2) CAPS Take 1 capsule by mouth 2 (two) times daily.     [provider]  nitroGLYCERIN (NITROSTAT) 0.4 MG SL tablet PLACE 1 TABLET (0.4 MG TOTAL) UNDER THE TONGUE EVERY 5 (FIVE) MINUTES X 3 DOSES AS NEEDED FOR CHEST PAIN. 03/03/21   Burnell Blanks, MD  ROCKLATAN 0.02-0.005 % SOLN INSTILL 1 DROP INTO THE RIGHT EYE QHS 04/10/19   [provider]  traMADol (ULTRAM) 50 MG tablet Take 1 tablet by mouth every 6 hours as needed for postop pain 05/18/21       Allergies    Maprotiline and Tetracycline hcl  Review of Systems   Review of Systems  Constitutional:  Negative for activity change, chills and fever.  Genitourinary:  Negative for decreased urine volume, difficulty urinating, penile pain, penile swelling, scrotal swelling and testicular pain.  Musculoskeletal:  Positive for gait problem and myalgias (Right groin pain). Negative for back pain and joint swelling.  Skin:  Negative for wound.  Neurological:  Positive for weakness. Negative for light-headedness.  All other systems reviewed and are negative.  Physical Exam Updated Vital Signs BP 114/84 (BP Location: Right Arm)   Pulse 65   Temp 98.4 F (36.9 C) (Oral)   Resp 16   Ht 6\' 2"  (1.88 m)   Wt 99.8 kg   SpO2 96%   BMI 28.25 kg/m   Physical Exam Vitals and nursing note reviewed.  Constitutional:      General: He is not in acute distress.    Appearance: Normal appearance. He is not ill-appearing.  HENT:     Head: Normocephalic and atraumatic.  Cardiovascular:     Rate and Rhythm: Normal rate and regular rhythm.  Pulmonary:  Effort:  Pulmonary effort is normal. No respiratory distress.     Breath sounds: Normal breath sounds. No wheezing.  Abdominal:     General: There is no distension.     Palpations: Abdomen is soft.     Tenderness: There is no abdominal tenderness. There is no guarding.     Hernia: There is no hernia in the right inguinal area.  Genitourinary:    Testes: Normal.        Right: Tenderness or swelling not present.        Left: Tenderness or swelling not present.  Musculoskeletal:        General: No swelling or deformity.     Right lower leg: No edema.     Left lower leg: No edema.     Comments: Without visual deformity or swelling.  Tenderness to palpation present only over deep inguinal crease.  Active range of motion limited in the right lower extremity secondary to pain.  Passive range of motion intact with reproduction of pain on flexion and internal rotation.  Right hip without tenderness to palpation, right thigh, right knee without tenderness to palpation.  DP pulses 2+ and symmetrical.  Lymphadenopathy:     Lower Body: No right inguinal adenopathy.  Neurological:     General: No focal deficit present.     Mental Status: He is alert.    ED Results / Procedures / Treatments   Labs (all labs ordered are listed, but only abnormal results are displayed) Labs Reviewed - No data to display  EKG None  Radiology No results found.  Procedures Procedures   Medications Ordered in ED Medications  HYDROcodone-acetaminophen (NORCO/VICODIN) 5-325 MG per tablet 1 tablet (has no administration in time range)    ED Course  I have reviewed the triage vital signs and the nursing notes.  Pertinent labs & imaging results that were available during my care of the patient were reviewed by me and considered in my medical decision making (see chart for details).    MDM Rules/Calculators/A&P                           85 year old male presents today for evaluation of right groin pain that started  while he was ambulating.  He did not hear or feel a pop.  He is neurovascularly intact.  Pain reproducible with flexion and internal rotation of right lower extremity.  Less likely DVT given onset and that patient is on chronic anticoagulation.  Patient has been able to ambulate since the incident with the use of his cane.  Patient has been able to urinate without difficulty.  Patient is appropriate for discharge.  Will discharge patient with baclofen and 3 days of hydrocodone.  Discussed importance of follow-up with Dr. Mardelle Matte patient's orthopedic surgeon.  Patient and wife both voiced understanding and are in agreement with plan.   Final Clinical Impression(s) / ED Diagnoses Final diagnoses:  Right groin pain    Rx / DC Orders ED Discharge Orders          Ordered    HYDROcodone-acetaminophen (NORCO/VICODIN) 5-325 MG tablet  Every 6 hours PRN        08/22/21 1315    baclofen (LIORESAL) 10 MG tablet  3 times daily        08/22/21 1315             Evlyn Courier, [place] 08/22/21 1329    Carmin Muskrat, MD 08/22/21 1643

## 2021-08-22 NOTE — ED Triage Notes (Signed)
Per EMS- Patient was sitting in a chair and when he attempted to get up he felt and heard a pop in his right groin. Patient states he began having pain in his right groin and right upper leg. + right pedal pulse. Patient denies any falls and is taking a blood thinner.

## 2021-09-14 DIAGNOSIS — L409 Psoriasis, unspecified: Secondary | ICD-10-CM | POA: Insufficient documentation

## 2021-09-14 DIAGNOSIS — Z7901 Long term (current) use of anticoagulants: Secondary | ICD-10-CM | POA: Insufficient documentation

## 2021-09-14 DIAGNOSIS — N3281 Overactive bladder: Secondary | ICD-10-CM | POA: Insufficient documentation

## 2021-09-30 ENCOUNTER — Encounter (HOSPITAL_BASED_OUTPATIENT_CLINIC_OR_DEPARTMENT_OTHER): Payer: Self-pay | Admitting: Emergency Medicine

## 2021-09-30 ENCOUNTER — Emergency Department (HOSPITAL_BASED_OUTPATIENT_CLINIC_OR_DEPARTMENT_OTHER)
Admission: EM | Admit: 2021-09-30 | Discharge: 2021-09-30 | Disposition: A | Discharge status: Home / Self Care | Payer: Medicare Other | Attending: Emergency Medicine | Admitting: Emergency Medicine

## 2021-09-30 ENCOUNTER — Emergency Department (HOSPITAL_BASED_OUTPATIENT_CLINIC_OR_DEPARTMENT_OTHER): Payer: Medicare Other

## 2021-09-30 DIAGNOSIS — Z7901 Long term (current) use of anticoagulants: Secondary | ICD-10-CM | POA: Diagnosis not present

## 2021-09-30 DIAGNOSIS — I10 Essential (primary) hypertension: Secondary | ICD-10-CM | POA: Diagnosis not present

## 2021-09-30 DIAGNOSIS — Z96612 Presence of left artificial shoulder joint: Secondary | ICD-10-CM | POA: Diagnosis not present

## 2021-09-30 DIAGNOSIS — Z7982 Long term (current) use of aspirin: Secondary | ICD-10-CM | POA: Insufficient documentation

## 2021-09-30 DIAGNOSIS — Z79899 Other long term (current) drug therapy: Secondary | ICD-10-CM | POA: Diagnosis not present

## 2021-09-30 DIAGNOSIS — I4891 Unspecified atrial fibrillation: Secondary | ICD-10-CM | POA: Diagnosis not present

## 2021-09-30 DIAGNOSIS — Z20822 Contact with and (suspected) exposure to covid-19: Secondary | ICD-10-CM | POA: Diagnosis not present

## 2021-09-30 DIAGNOSIS — U071 COVID-19: Secondary | ICD-10-CM

## 2021-09-30 DIAGNOSIS — Z96642 Presence of left artificial hip joint: Secondary | ICD-10-CM | POA: Insufficient documentation

## 2021-09-30 DIAGNOSIS — I2510 Atherosclerotic heart disease of native coronary artery without angina pectoris: Secondary | ICD-10-CM | POA: Insufficient documentation

## 2021-09-30 DIAGNOSIS — Z87891 Personal history of nicotine dependence: Secondary | ICD-10-CM | POA: Diagnosis not present

## 2021-09-30 DIAGNOSIS — Z85828 Personal history of other malignant neoplasm of skin: Secondary | ICD-10-CM | POA: Insufficient documentation

## 2021-09-30 DIAGNOSIS — R059 Cough, unspecified: Secondary | ICD-10-CM | POA: Diagnosis present

## 2021-09-30 DIAGNOSIS — Z96611 Presence of right artificial shoulder joint: Secondary | ICD-10-CM | POA: Diagnosis not present

## 2021-09-30 LAB — CBC WITH DIFFERENTIAL/PLATELET
Abs Immature Granulocytes: 0.02 10*3/uL (ref 0.00–0.07)
Basophils Absolute: 0 10*3/uL (ref 0.0–0.1)
Basophils Relative: 1 %
Eosinophils Absolute: 0.1 10*3/uL (ref 0.0–0.5)
Eosinophils Relative: 1 %
HCT: 39.8 % (ref 39.0–52.0)
Hemoglobin: 13.2 g/dL (ref 13.0–17.0)
Immature Granulocytes: 0 %
Lymphocytes Relative: 17 %
Lymphs Abs: 1 10*3/uL (ref 0.7–4.0)
MCH: 32.3 pg (ref 26.0–34.0)
MCHC: 33.2 g/dL (ref 30.0–36.0)
MCV: 97.3 fL (ref 80.0–100.0)
Monocytes Absolute: 0.7 10*3/uL (ref 0.1–1.0)
Monocytes Relative: 13 %
Neutro Abs: 3.7 10*3/uL (ref 1.7–7.7)
Neutrophils Relative %: 68 %
Platelets: 151 10*3/uL (ref 150–400)
RBC: 4.09 MIL/uL — ABNORMAL LOW (ref 4.22–5.81)
RDW: 13.4 % (ref 11.5–15.5)
WBC: 5.5 10*3/uL (ref 4.0–10.5)
nRBC: 0 % (ref 0.0–0.2)

## 2021-09-30 LAB — RESP PANEL BY RT-PCR (FLU A&B, COVID) ARPGX2
Influenza A by PCR: NEGATIVE
Influenza B by PCR: NEGATIVE
SARS Coronavirus 2 by RT PCR: POSITIVE — AB

## 2021-09-30 LAB — URINALYSIS, ROUTINE W REFLEX MICROSCOPIC
Bilirubin Urine: NEGATIVE
Glucose, UA: NEGATIVE mg/dL
Ketones, ur: NEGATIVE mg/dL
Leukocytes,Ua: NEGATIVE
Nitrite: NEGATIVE
Protein, ur: NEGATIVE mg/dL
Specific Gravity, Urine: 1.025 (ref 1.005–1.030)
pH: 7 (ref 5.0–8.0)

## 2021-09-30 LAB — PROTIME-INR
INR: 1.3 — ABNORMAL HIGH (ref 0.8–1.2)
Prothrombin Time: 16.5 seconds — ABNORMAL HIGH (ref 11.4–15.2)

## 2021-09-30 LAB — COMPREHENSIVE METABOLIC PANEL
ALT: 32 U/L (ref 0–44)
AST: 37 U/L (ref 15–41)
Albumin: 4.3 g/dL (ref 3.5–5.0)
Alkaline Phosphatase: 63 U/L (ref 38–126)
Anion gap: 10 (ref 5–15)
BUN: 14 mg/dL (ref 8–23)
CO2: 26 mmol/L (ref 22–32)
Calcium: 8.7 mg/dL — ABNORMAL LOW (ref 8.9–10.3)
Chloride: 103 mmol/L (ref 98–111)
Creatinine, Ser: 0.68 mg/dL (ref 0.61–1.24)
GFR, Estimated: >60 mL/min (ref >60)
Glucose, Bld: 118 mg/dL — ABNORMAL HIGH (ref 70–99)
Potassium: 3.6 mmol/L (ref 3.5–5.1)
Sodium: 139 mmol/L (ref 135–145)
Total Bilirubin: 1 mg/dL (ref 0.3–1.2)
Total Protein: 7.2 g/dL (ref 6.5–8.1)

## 2021-09-30 LAB — URINALYSIS, MICROSCOPIC (REFLEX)

## 2021-09-30 LAB — APTT: aPTT: 37 seconds — ABNORMAL HIGH (ref 24–36)

## 2021-09-30 LAB — LACTIC ACID, PLASMA: Lactic Acid, Venous: 1.1 mmol/L (ref 0.5–1.9)

## 2021-09-30 MED ORDER — SODIUM CHLORIDE 0.9 % IV SOLN
500.0000 mg | INTRAVENOUS | Status: DC
  Administered 2021-09-30: 500 mg via INTRAVENOUS
  Filled 2021-09-30: qty 500

## 2021-09-30 MED ORDER — IBUPROFEN 400 MG PO TABS
400.0000 mg | ORAL_TABLET | Freq: Once | ORAL | Status: AC
  Administered 2021-09-30: 400 mg via ORAL
  Filled 2021-09-30: qty 1

## 2021-09-30 MED ORDER — LACTATED RINGERS IV SOLN: INTRAVENOUS | Status: DC

## 2021-09-30 MED ORDER — MOLNUPIRAVIR 200 MG PO CAPS
4.0000 | ORAL_CAPSULE | Freq: Two times a day (BID) | ORAL | 0 refills | Status: AC
Start: 2021-09-30 — End: 2021-10-05

## 2021-09-30 MED ORDER — SODIUM CHLORIDE 0.9 % IV SOLN: INTRAVENOUS | Status: DC | PRN

## 2021-09-30 MED ORDER — ACETAMINOPHEN 500 MG PO TABS
1000.0000 mg | ORAL_TABLET | Freq: Once | ORAL | Status: AC
  Administered 2021-09-30: 1000 mg via ORAL
  Filled 2021-09-30: qty 2

## 2021-09-30 MED ORDER — SODIUM CHLORIDE 0.9 % IV BOLUS
1000.0000 mL | Freq: Once | INTRAVENOUS | Status: AC
  Administered 2021-09-30: 1000 mL via INTRAVENOUS

## 2021-09-30 MED ORDER — LACTATED RINGERS IV BOLUS (SEPSIS): 1000.0000 mL | Freq: Once | INTRAVENOUS | Status: DC

## 2021-09-30 MED ORDER — SODIUM CHLORIDE 0.9 % IV SOLN
2.0000 g | INTRAVENOUS | Status: DC
  Administered 2021-09-30: 2 g via INTRAVENOUS
  Filled 2021-09-30: qty 20

## 2021-09-30 NOTE — Progress Notes (Signed)
[facility] following for sepsis protocol. 

## 2021-09-30 NOTE — ED Notes (Signed)
Pt given urinal.

## 2021-09-30 NOTE — ED Notes (Signed)
Pt discharged to home. Discharge instructions have been discussed with patient and/or family members. Pt verbally acknowledges understanding d/c instructions, and endorses comprehension to checkout at registration before leaving.  °

## 2021-09-30 NOTE — Progress Notes (Signed)
Code Sepsis monitoring discontinued due to discharge.  

## 2021-09-30 NOTE — ED Notes (Signed)
ABX d/c per EDP [place].

## 2021-09-30 NOTE — ED Triage Notes (Signed)
Pt states yesterday late afternoon/evening he felt like his lungs were getting congested especially on the left side  Pt states he went to bed and he uses a cpap but this morning he is coughing up clear phlegm   Pt has hx of pneumonia

## 2021-09-30 NOTE — ED Notes (Signed)
Covid + per Arbie Cookey in lab

## 2021-09-30 NOTE — ED Provider Notes (Signed)
[facility] EMERGENC[facility] DEPARTMENT Provider Note   CSN: [id number] Arrival date & time: 09/30/21  7824     History Chief Complaint  Patient presents with   Cough    Jose [name]. is a 85 y.o. male.  The history is provided by the patient.  Cough Cough characteristics:  Productive Sputum characteristics:  Nondescript Severity:  Moderate Onset quality:  Gradual Duration:  2 days Timing:  Constant Progression:  Unchanged Chronicity:  New Context: upper respiratory infection   Relieved by:  Nothing Worsened by:  Nothing Associated symptoms: chills, fever and sinus congestion   Associated symptoms: no chest pain, no ear pain, no rash, no shortness of breath and no sore throat       Past Medical History:  Diagnosis Date   Arthritis    CAD (coronary artery disease)    04/15/18 65% dLAD, 20% pLAD, 30% 2nd diag, Normal EF   Dysrhythmia    afib   Gallstones 07/1996   Headache    couple times a year   History of CT scan of head 1997   small vessel disease   History of ETT 05/2002    no EKG changes   History of MRI of cervical spine 07/15/2002   Hypertension    Lumbar spondylolysis 12/17/2016   MGUS (monoclonal gammopathy of unknown significance) 04/11/2012   Occipital neuralgia of left side 12/09/2018   Peripheral neuropathy    Pneumonia 10/2014   PONV (postoperative nausea and vomiting)    once many years ago   Primary localized osteoarthritis of left hip 07/08/2018   Primary localized osteoarthrosis of left shoulder 12/25/2016   Primary localized osteoarthrosis of right shoulder 11/05/2017   Rectal bleeding 12/2002   colonoscopy   Seizures ([place])    last seizure 1976   Skin cancer    Sleep apnea    uses CPAP   Spinal stenosis, lumbar region with neurogenic claudication    Stroke [facility])    ?tia ?afib   TIA (transient ischemic attack)    Ulnar neuropathy at elbow 12/24/2014   Left   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 extremity 03/29/2015    Patient  Active Problem List   Diagnosis Date Noted   Memory loss 04/18/2021   Occipital neuralgia of left side 12/09/2018   Primary localized osteoarthritis of left hip 07/08/2018   Primary localized osteoarthritis of hip 07/08/2018   CAD (coronary artery disease) 04/16/2018   Primary localized osteoarthrosis of right shoulder 11/05/2017   Primary localized osteoarthrosis of shoulder 11/05/2017   Lumbar stenosis with neurogenic claudication 07/19/2017   Primary localized osteoarthrosis of left shoulder 12/25/2016   S/P shoulder replacement 12/25/2016   Lumbar spondylolysis 12/17/2016   Paresthesia 01/24/2016   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 extremity 03/29/2015   Cervical spinal stenosis 02/04/2015   Bronchiectasis without acute exacerbation ([place]) 01/11/2015   TIA (transient ischemic attack) 12/15/2014   Hyperlipidemia 11/24/2014   PAF (paroxysmal atrial fibrillation) (HCC)    MGUS (monoclonal gammopathy of unknown significance) 04/11/2012   GOUT, UNSPECIFIED 10/04/2009   HYPERTROPHY PROSTATE W/O UR OBST & OTH LUTS 12/14/2008   ALLERGIC CONJUNCTIVITIS 12/03/2007   TESTOSTERONE DEFICIENCY 11/14/2007   Thoracic back pain 04/22/2007   HYPERLIPIDEMIA 12/26/2006   OBESITY, NOS 12/26/2006   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   Peripheral neuropathy 12/26/2006   HEARING LOSS NOS OR DEAFNESS 12/26/2006   HYPERTENSION, BENIGN SYSTEMIC 12/26/2006   OSTEOARTHRITIS, MULTI SITES 12/26/2006   Musculoskeletal disorder and symptoms referable to  neck 12/26/2006    Past Surgical History:  Procedure Laterality Date   ANTERIOR CERVICAL DECOMP/DISCECTOMY FUSION N/A 02/04/2015   Procedure: ANTERIOR CERVICAL DECOMPRESSION/DISCECTOMY FUSION CERVICAL FIVE-SIX,CERVICAL SIX-SEVEN;  Surgeon: Karie Chimera, MD;  Location: [place] [facility];  Service: Neurosurgery;  Laterality: N/A;   arthroscopic knee surgery     BACK SURGERY     CATARACT EXTRACTION Bilateral    COLONOSCOPY W/ POLYPECTOMY  04/19/2003    tubular adenoma   EYE SURGERY     Bilateral Cataracts   GREAT TOE ARTHRODESIS, INTERPHALANGEAL JOINT Left 1978   JOINT REPLACEMENT     joint replacement on right thumb Right    LEFT HEART CATH AND CORONARY ANGIOGRAPHY N/A 04/15/2018   Procedure: LEFT HEART CATH AND CORONARY ANGIOGRAPHY;  Surgeon: Troy Sine, MD;  Location: [facility];  Service: Cardiovascular;  Laterality: N/A;   LUMBAR LAMINECTOMY/DECOMPRESSION MICRODISCECTOMY N/A 07/19/2017   Procedure: LAMINECTOMY AND FORAMINOTOMY  LUMBAR TWO- LUMBAR THREE, LUMBAR THREE- LUMBAR FOUR;  Surgeon: Ashok Pall, MD;  Location: [facility];  Service: Neurosurgery;  Laterality: N/A;   nerve transfer surgery to lwft hand Left    Nov. 2017   SHOULDER ARTHROSCOPY W/ ROTATOR CUFF REPAIR Left 2013   rotator cuff    SHOULDER INJECTION  03/2011   left, [facility]   SKIN CANCER EXCISION  07/1993   left lateral arm   SUPERFICIAL KERATECTOMY Right 05/1998   wrist   thumb injection  03/2011   left, [place]   TONSILLECTOMY AND ADENOIDECTOMY     TOTAL HIP ARTHROPLASTY Left 07/08/2018   TOTAL HIP ARTHROPLASTY Left 07/08/2018   Procedure: LEFT TOTAL HIP ARTHROPLASTY;  Surgeon: Marchia Bond, MD;  Location: [facility];  Service: Orthopedics;  Laterality: Left;   TOTAL SHOULDER ARTHROPLASTY Left 12/25/2016   Procedure: TOTAL SHOULDER ARTHROPLASTY;  Surgeon: Marchia Bond, MD;  Location: [facility];  Service: Orthopedics;  Laterality: Left;   TOTAL SHOULDER ARTHROPLASTY Right 11/05/2017   TOTAL SHOULDER ARTHROPLASTY Right 11/05/2017   Procedure: TOTAL SHOULDER ARTHROPLASTY;  Surgeon: Marchia Bond, MD;  Location: [facility];  Service: Orthopedics;  Laterality: Right;   ulnar nerve transfer  12/1999   left   ULNAR NERVE TRANSPOSITION Left        Family History  Problem Relation Age of Onset   Dementia Mother 71       vascular   Transient ischemic attack Mother    Dementia Father        after hip fracture   Hypertension Sister        skin cancer    Seizures Neg Hx     Social History   Tobacco Use   Smoking status: Former    Packs/day: 2.00    Years: 13.00    Pack years: 26.00    Types: Cigarettes    Quit date: 03/05/1968    Years since quitting: 53.6   Smokeless tobacco: Never  Vaping Use   Vaping Use: Never used  Substance Use Topics   Alcohol use: Yes    Comment: social   Drug use: No    Home Medications Prior to Admission medications   Medication Sig Start Date End Date Taking? Authorizing Provider  molnupiravir EUA (LAGEVRIO) 200 MG CAPS capsule Take 4 capsules (800 mg total) by mouth 2 (two) times daily for 5 days. 09/30/21 10/05/21 Yes Maxine Huynh, DO  acetaminophen (TYLENOL) 650 MG CR tablet 2 tablets as needed    [provider]  amLODipine (NORVASC) 5 MG tablet TAKE 1  TABLET DAILY 06/08/19   [provider]  apixaban (ELIQUIS) 5 MG TABS tablet Take 1 tablet (5 mg total) by mouth 2 (two) times daily. 04/21/21   Burnell Blanks, MD  aspirin 81 MG chewable tablet 1 tab    [provider]  atorvastatin (LIPITOR) 80 MG tablet 1 tablet    [provider]  baclofen (LIORESAL) 10 MG tablet Take 1 tablet (10 mg total) by mouth 3 (three) times daily. 08/22/21   Evlyn Courier, PA-C  cephALEXin (KEFLEX) 500 MG capsule Take 1 (one) capsule by mouth three times daily 06/26/21     isosorbide dinitrate (ISORDIL) 30 MG tablet 1 tablet    [provider]  isosorbide mononitrate (IMDUR) 30 MG 24 hr tablet TAKE 1 TABLET TWICE A DAY (DOSE INCREASE) 11/08/20   Burnell Blanks, MD  labetalol (NORMODYNE) 200 MG tablet Take 100 mg by mouth 2 (two) times daily.    [provider]  latanoprost (XALATAN) 0.005 % ophthalmic solution 1 drop into affected eye in the evening    [provider]  loratadine (CLARITIN) 10 MG tablet Take 1 tablet (10 mg total) by mouth daily for 14 days. 06/02/21     losartan (COZAAR) 50 MG tablet Take 1 tablet by mouth daily. 02/25/20   [provider]  Multiple Vitamins-Minerals (PRESERVISION AREDS 2) CAPS Take 1 capsule by mouth 2 (two) times daily.     [provider]  nitroGLYCERIN (NITROSTAT) 0.4 MG SL tablet PLACE 1 TABLET (0.4 MG TOTAL) UNDER THE TONGUE EVERY 5 (FIVE) MINUTES X 3 DOSES AS NEEDED FOR CHEST PAIN. 03/03/21   Burnell Blanks, MD  ROCKLATAN 0.02-0.005 % SOLN INSTILL 1 DROP INTO THE RIGHT EYE QHS 04/10/19   [provider]  traMADol (ULTRAM) 50 MG tablet Take 1 tablet by mouth every 6 hours as needed for postop pain 05/18/21       Allergies    Maprotiline and Tetracycline hcl  Review of Systems   Review of Systems  Constitutional:  Positive for chills and fever.  HENT:  Negative for ear pain and sore throat.   Eyes:  Negative for pain and visual disturbance.  Respiratory:  Positive for cough. Negative for shortness of breath.   Cardiovascular:  Negative for chest pain and palpitations.  Gastrointestinal:  Negative for abdominal pain and vomiting.  Genitourinary:  Negative for dysuria and hematuria.  Musculoskeletal:  Negative for arthralgias and back pain.  Skin:  Negative for color change and rash.  Neurological:  Negative for seizures and syncope.  All other systems reviewed and are negative.  Physical Exam Updated Vital Signs BP (!) 150/92   Pulse 96   Temp 100.3 F (37.9 C) (Oral)   Resp (!) 21   Ht 6\' 2"  (1.88 m)   Wt 100 kg   SpO2 96%   BMI 28.30 kg/m   Physical Exam Vitals and nursing note reviewed.  Constitutional:      General: He is not in acute distress.    Appearance: He is well-developed. He is ill-appearing.  HENT:     Head: Normocephalic and atraumatic.     Nose: Congestion present.     Mouth/Throat:     Mouth: Mucous membranes are moist.  Eyes:     Extraocular Movements: Extraocular movements intact.     Conjunctiva/sclera: Conjunctivae normal.     Pupils: Pupils are equal, round, and reactive to light.  Cardiovascular:     Rate and Rhythm:  Normal  rate and regular rhythm.     Pulses: Normal pulses.     Heart sounds: Normal heart sounds. No murmur heard. Pulmonary:     Effort: Pulmonary effort is normal. No respiratory distress.     Breath sounds: Normal breath sounds.  Abdominal:     General: There is no distension.     Palpations: Abdomen is soft.     Tenderness: There is no abdominal tenderness.  Musculoskeletal:        General: No swelling.     Cervical back: Neck supple.  Skin:    General: Skin is warm and dry.     Capillary Refill: Capillary refill takes less than 2 seconds.  Neurological:     General: No focal deficit present.     Mental Status: He is alert.  Psychiatric:        Mood and Affect: Mood normal.    ED Results / Procedures / Treatments   Labs (all labs ordered are listed, but only abnormal results are displayed) Labs Reviewed  RESP PANEL BY RT-PCR (FLU A&B, COVID) ARPGX2 - Abnormal; Notable for the following components:      Result Value   SARS Coronavirus 2 by RT PCR POSITIVE (*)    All other components within normal limits  COMPREHENSIVE METABOLIC PANEL - Abnormal; Notable for the following components:   Glucose, Bld 118 (*)    Calcium 8.7 (*)    All other components within normal limits  CBC WITH DIFFERENTIAL/PLATELET - Abnormal; Notable for the following components:   RBC 4.09 (*)    All other components within normal limits  PROTIME-INR - Abnormal; Notable for the following components:   Prothrombin Time 16.5 (*)    INR 1.3 (*)    All other components within normal limits  APTT - Abnormal; Notable for the following components:   aPTT 37 (*)    All other components within normal limits  URINALYSIS, ROUTINE W REFLEX MICROSCOPIC - Abnormal; Notable for the following components:   Hgb urine dipstick TRACE (*)    All other components within normal limits  URINALYSIS, MICROSCOPIC (REFLEX) - Abnormal; Notable for the following components:   Bacteria, UA MANY (*)    All other components  within normal limits  CULTURE, BLOOD (ROUTINE X 2)  CULTURE, BLOOD (ROUTINE X 2)  URINE CULTURE  LACTIC ACID, PLASMA    EKG EKG Interpretation  Date/Time:  Saturday September 30 2021 07:34:21 EST Ventricular Rate:  97 PR Interval:  235 QRS Duration: 98 QT Interval:  370 QTC Calculation: 470 R Axis:   -32 Text Interpretation: Sinus rhythm Prolonged PR interval Left axis deviation Abnormal R-wave progression, late transition Confirmed by Lennice Sites ([id number]) on 09/30/2021 7:43:40 AM  Radiology DG Chest Portable 1 View  Result Date: 09/30/2021 CLINICAL DATA:  85 year old male with cough and fever. EXAM: PORTABLE CHEST 1 VIEW COMPARISON:  04/14/2018 radiographs.  Cardiac CT 05/20/2020. FINDINGS: Portable AP upright view at 0803 hours. Stable cardiomegaly and mediastinal contours. Stable somewhat low lung volumes. Visualized tracheal air column is within normal limits. Stable lung markings. Allowing for portable technique the lungs are clear. Chronic cervical fusion and bilateral shoulder arthroplasty. Negative visible bowel gas. IMPRESSION: No acute cardiopulmonary abnormality. Electronically Signed   By: Genevie Ann M.D.   On: 09/30/2021 08:46    Procedures Procedures   Medications Ordered in ED Medications  lactated ringers infusion (has no administration in time range)  lactated ringers bolus 1,000 mL (has no administration in time range)  cefTRIAXone (ROCEPHIN) 2 g in sodium chloride 0.9 % 100 mL IVPB (0 g Intravenous Stopped 09/30/21 0834)  azithromycin (ZITHROMAX) 500 mg in sodium chloride 0.9 % 250 mL IVPB (0 mg Intravenous Stopped 09/30/21 0925)  0.9 %  sodium chloride infusion ( Intravenous Stopped 09/30/21 0905)  acetaminophen (TYLENOL) tablet 1,000 mg (1,000 mg Oral Given 09/30/21 0816)  sodium chloride 0.9 % bolus 1,000 mL (1,000 mLs Intravenous New Bag/Given 09/30/21 0833)  ibuprofen (ADVIL) tablet 400 mg (400 mg Oral Given 09/30/21 5456)    ED Course  I have reviewed the triage  vital signs and the nursing notes.  Pertinent labs & imaging results that were available during my care of the patient were reviewed by me and considered in my medical decision making (see chart for details).    MDM Rules/Calculators/A&P                           [name] Is an 85 year old male history of CAD, stroke, hypertension who presents the ED with cough, fever, congestion.  Patient febrile and tachycardic upon arrival.  Sepsis work-up initiated with broad-spectrum IV antibiotics.  Viral type symptoms with cough and sputum production.  Suspect virus versus pneumonia.  No abdominal pain or pain with urination.  Overall appears to have clear breath sounds bilaterally.  Oxygenation appears to be within normal limits.  No obvious increased work of breathing.  Infectious work-up initiated including IV fluids and antibiotics will reevaluate after blood work and imaging for disposition.  Overall lab work is not consistent with sepsis.  No leukocytosis.  Normal lactic acid.  No significant electrolyte abnormality or kidney injury.  Chest x-ray without evidence of pneumonia.  Patient is positive for COVID-19.  We will start him on an antiviral.  He is fully vaccinated.  No respiratory distress.  Normal vitals other than previously low-grade fever.  Understands return precautions and discharged in the ED in good condition.  This chart was dictated using voice recognition software.  Despite best efforts to proofread,  errors can occur which can change the documentation meaning.    Final Clinical Impression(s) / ED Diagnoses Final diagnoses:  COVID-19    Rx / DC Orders ED Discharge Orders          Ordered    molnupiravir EUA (LAGEVRIO) 200 MG CAPS capsule  2 times daily        09/30/21 0925             Lennice Sites, DO 09/30/21 [phone]

## 2021-09-30 NOTE — Discharge Instructions (Signed)
Take antiviral as prescribed.  If you develop any significant nausea, vomiting, diarrhea please discontinue this medicine.  Recommend 1000 mg of Tylenol every 6 hours as needed for fever.  Please return if you develop worsening symptoms including worsening shortness of breath, fatigue or if you are having a fever of 100.4 or higher for more than 5 days.

## 2021-09-30 NOTE — ED Notes (Signed)
Pt ambulatory to d/c with standby assist. PT refused wheelchair.

## 2021-09-30 NOTE — ED Notes (Signed)
Portable Xray at bedside.

## 2021-10-01 LAB — URINE CULTURE: Culture: NO GROWTH

## 2021-10-05 LAB — CULTURE, BLOOD (ROUTINE X 2)
Culture: NO GROWTH
Culture: NO GROWTH
Special Requests: ADEQUATE
Special Requests: ADEQUATE

## 2021-10-10 ENCOUNTER — Emergency Department (HOSPITAL_BASED_OUTPATIENT_CLINIC_OR_DEPARTMENT_OTHER): Payer: Medicare Other

## 2021-10-10 ENCOUNTER — Other Ambulatory Visit: Payer: Self-pay

## 2021-10-10 ENCOUNTER — Encounter (HOSPITAL_BASED_OUTPATIENT_CLINIC_OR_DEPARTMENT_OTHER): Payer: Self-pay | Admitting: *Deleted

## 2021-10-10 ENCOUNTER — Emergency Department (HOSPITAL_BASED_OUTPATIENT_CLINIC_OR_DEPARTMENT_OTHER)
Admission: EM | Admit: 2021-10-10 | Discharge: 2021-10-10 | Disposition: A | Discharge status: Home / Self Care | Payer: Medicare Other | Attending: Emergency Medicine | Admitting: Emergency Medicine

## 2021-10-10 DIAGNOSIS — W1809XA Striking against other object with subsequent fall, initial encounter: Secondary | ICD-10-CM | POA: Insufficient documentation

## 2021-10-10 DIAGNOSIS — I2510 Atherosclerotic heart disease of native coronary artery without angina pectoris: Secondary | ICD-10-CM | POA: Insufficient documentation

## 2021-10-10 DIAGNOSIS — Z7982 Long term (current) use of aspirin: Secondary | ICD-10-CM | POA: Insufficient documentation

## 2021-10-10 DIAGNOSIS — Z7901 Long term (current) use of anticoagulants: Secondary | ICD-10-CM | POA: Diagnosis not present

## 2021-10-10 DIAGNOSIS — Z79899 Other long term (current) drug therapy: Secondary | ICD-10-CM | POA: Insufficient documentation

## 2021-10-10 DIAGNOSIS — Z96642 Presence of left artificial hip joint: Secondary | ICD-10-CM | POA: Diagnosis not present

## 2021-10-10 DIAGNOSIS — S01112A Laceration without foreign body of left eyelid and periocular area, initial encounter: Secondary | ICD-10-CM | POA: Insufficient documentation

## 2021-10-10 DIAGNOSIS — I10 Essential (primary) hypertension: Secondary | ICD-10-CM | POA: Diagnosis not present

## 2021-10-10 DIAGNOSIS — Z96612 Presence of left artificial shoulder joint: Secondary | ICD-10-CM | POA: Insufficient documentation

## 2021-10-10 DIAGNOSIS — Z96611 Presence of right artificial shoulder joint: Secondary | ICD-10-CM | POA: Diagnosis not present

## 2021-10-10 DIAGNOSIS — M25531 Pain in right wrist: Secondary | ICD-10-CM | POA: Diagnosis not present

## 2021-10-10 DIAGNOSIS — S20219A Contusion of unspecified front wall of thorax, initial encounter: Secondary | ICD-10-CM

## 2021-10-10 DIAGNOSIS — R519 Headache, unspecified: Secondary | ICD-10-CM | POA: Diagnosis not present

## 2021-10-10 DIAGNOSIS — Z87891 Personal history of nicotine dependence: Secondary | ICD-10-CM | POA: Insufficient documentation

## 2021-10-10 DIAGNOSIS — W19XXXA Unspecified fall, initial encounter: Secondary | ICD-10-CM

## 2021-10-10 DIAGNOSIS — Y92009 Unspecified place in unspecified non-institutional (private) residence as the place of occurrence of the external cause: Secondary | ICD-10-CM | POA: Diagnosis not present

## 2021-10-10 DIAGNOSIS — S20212A Contusion of left front wall of thorax, initial encounter: Secondary | ICD-10-CM | POA: Diagnosis not present

## 2021-10-10 DIAGNOSIS — Z85828 Personal history of other malignant neoplasm of skin: Secondary | ICD-10-CM | POA: Diagnosis not present

## 2021-10-10 DIAGNOSIS — S0993XA Unspecified injury of face, initial encounter: Secondary | ICD-10-CM | POA: Diagnosis present

## 2021-10-10 DIAGNOSIS — Z8673 Personal history of transient ischemic attack (TIA), and cerebral infarction without residual deficits: Secondary | ICD-10-CM | POA: Insufficient documentation

## 2021-10-10 DIAGNOSIS — S0181XA Laceration without foreign body of other part of head, initial encounter: Secondary | ICD-10-CM

## 2021-10-10 LAB — TROPONIN I (HIGH SENSITIVITY)
Troponin I (High Sensitivity): 10 ng/L (ref <18)
Troponin I (High Sensitivity): 10 ng/L (ref <18)

## 2021-10-10 LAB — BASIC METABOLIC PANEL
Anion gap: 12 (ref 5–15)
BUN: 20 mg/dL (ref 8–23)
CO2: 26 mmol/L (ref 22–32)
Calcium: 8.8 mg/dL — ABNORMAL LOW (ref 8.9–10.3)
Chloride: 99 mmol/L (ref 98–111)
Creatinine, Ser: 0.81 mg/dL (ref 0.61–1.24)
GFR, Estimated: >60 mL/min (ref >60)
Glucose, Bld: 108 mg/dL — ABNORMAL HIGH (ref 70–99)
Potassium: 3.4 mmol/L — ABNORMAL LOW (ref 3.5–5.1)
Sodium: 137 mmol/L (ref 135–145)

## 2021-10-10 LAB — CBC
HCT: 36.8 % — ABNORMAL LOW (ref 39.0–52.0)
Hemoglobin: 12.4 g/dL — ABNORMAL LOW (ref 13.0–17.0)
MCH: 32.5 pg (ref 26.0–34.0)
MCHC: 33.7 g/dL (ref 30.0–36.0)
MCV: 96.3 fL (ref 80.0–100.0)
Platelets: 232 10*3/uL (ref 150–400)
RBC: 3.82 MIL/uL — ABNORMAL LOW (ref 4.22–5.81)
RDW: 13.8 % (ref 11.5–15.5)
WBC: 9 10*3/uL (ref 4.0–10.5)
nRBC: 0 % (ref 0.0–0.2)

## 2021-10-10 MED ORDER — ACETAMINOPHEN 325 MG PO TABS
650.0000 mg | ORAL_TABLET | Freq: Once | ORAL | Status: AC
  Administered 2021-10-10: 650 mg via ORAL
  Filled 2021-10-10: qty 2

## 2021-10-10 NOTE — ED Notes (Signed)
Patient transported to CT 

## 2021-10-10 NOTE — ED Triage Notes (Addendum)
Chest pain while bringing a Christmas tree into the house. He stumbled and fell on to his left arm. Laceration to his left eyebrow. He has had sharp pain in his left chest since the fall. He takes Eliquis.

## 2021-10-10 NOTE — Discharge Instructions (Signed)
Take over-the-counter medications as needed for pain.  Apply ice to help with swelling.

## 2021-10-10 NOTE — ED Provider Notes (Signed)
[facility] Provider Note   CSN: [id number] Arrival date & time: 10/10/21  2001     History Chief Complaint  Patient presents with   Chest Pain   Fall    Jose [name]. is a 85 y.o. male.   Chest Pain Fall Associated symptoms include chest pain.   Patient presented to the ER for evaluation of chest pain and injuries after a fall.  Patient was carrying in the Christmas tree today when he ended up stumbling and falling.  Patient states he did sustain a slight laceration above his eyebrow.  He denies any loss of consciousness or severe headache but he is taking Eliquis.  Patient started having pain in his chest after the fall.  It is a sharp pain on the left side.  It does increase somewhat with palpation.  Patient does have a history of heart disease and as the pain persisted he is worried about his heart.  He tried taking nitroglycerin without relief.  He is having some pain in his wrist but no pain in his legs.  Past Medical History:  Diagnosis Date   Arthritis    CAD (coronary artery disease)    04/15/18 65% dLAD, 20% pLAD, 30% 2nd diag, Normal EF   Dysrhythmia    afib   Gallstones 07/1996   Headache    couple times a year   History of CT scan of head 1997   small vessel disease   History of ETT 05/2002    no EKG changes   History of MRI of cervical spine 07/15/2002   Hypertension    Lumbar spondylolysis 12/17/2016   MGUS (monoclonal gammopathy of unknown significance) 04/11/2012   Occipital neuralgia of left side 12/09/2018   Peripheral neuropathy    Pneumonia 10/2014   PONV (postoperative nausea and vomiting)    once many years ago   Primary localized osteoarthritis of left hip 07/08/2018   Primary localized osteoarthrosis of left shoulder 12/25/2016   Primary localized osteoarthrosis of right shoulder 11/05/2017   Rectal bleeding 12/2002   colonoscopy   Seizures ([place])    last seizure 1976   Skin cancer    Sleep apnea    uses CPAP    Spinal stenosis, lumbar region with neurogenic claudication    Stroke [facility])    ?tia ?afib   TIA (transient ischemic attack)    Ulnar neuropathy at elbow 12/24/2014   Left   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 extremity 03/29/2015    Patient Active Problem List   Diagnosis Date Noted   Memory loss 04/18/2021   Occipital neuralgia of left side 12/09/2018   Primary localized osteoarthritis of left hip 07/08/2018   Primary localized osteoarthritis of hip 07/08/2018   CAD (coronary artery disease) 04/16/2018   Primary localized osteoarthrosis of right shoulder 11/05/2017   Primary localized osteoarthrosis of shoulder 11/05/2017   Lumbar stenosis with neurogenic claudication 07/19/2017   Primary localized osteoarthrosis of left shoulder 12/25/2016   S/P shoulder replacement 12/25/2016   Lumbar spondylolysis 12/17/2016   Paresthesia 01/24/2016   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 extremity 03/29/2015   Cervical spinal stenosis 02/04/2015   Bronchiectasis without acute exacerbation ([place]) 01/11/2015   TIA (transient ischemic attack) 12/15/2014   Hyperlipidemia 11/24/2014   PAF (paroxysmal atrial fibrillation) ([facility])    MGUS (monoclonal gammopathy of unknown significance) 04/11/2012   GOUT, UNSPECIFIED 10/04/2009   HYPERTROPHY PROSTATE W/O UR OBST & OTH LUTS 12/14/2008   ALLERGIC  CONJUNCTIVITIS 12/03/2007   TESTOSTERONE DEFICIENCY 11/14/2007   Thoracic back pain 04/22/2007   HYPERLIPIDEMIA 12/26/2006   OBESITY, NOS 12/26/2006   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   Peripheral neuropathy 12/26/2006   HEARING LOSS NOS OR DEAFNESS 12/26/2006   HYPERTENSION, BENIGN SYSTEMIC 12/26/2006   OSTEOARTHRITIS, MULTI SITES 12/26/2006   Musculoskeletal disorder and symptoms referable to neck 12/26/2006    Past Surgical History:  Procedure Laterality Date   ANTERIOR CERVICAL DECOMP/DISCECTOMY FUSION N/A 02/04/2015   Procedure: ANTERIOR CERVICAL DECOMPRESSION/DISCECTOMY FUSION CERVICAL  FIVE-SIX,CERVICAL SIX-SEVEN;  Surgeon: Karie Chimera, MD;  Location: [facility];  Service: Neurosurgery;  Laterality: N/A;   arthroscopic knee surgery     BACK SURGERY     CATARACT EXTRACTION Bilateral    COLONOSCOPY W/ POLYPECTOMY  04/19/2003   tubular adenoma   EYE SURGERY     Bilateral Cataracts   GREAT TOE ARTHRODESIS, INTERPHALANGEAL JOINT Left 1978   JOINT REPLACEMENT     joint replacement on right thumb Right    LEFT HEART CATH AND CORONARY ANGIOGRAPHY N/A 04/15/2018   Procedure: LEFT HEART CATH AND CORONARY ANGIOGRAPHY;  Surgeon: Troy Sine, MD;  Location: [facility];  Service: Cardiovascular;  Laterality: N/A;   LUMBAR LAMINECTOMY/DECOMPRESSION MICRODISCECTOMY N/A 07/19/2017   Procedure: LAMINECTOMY AND FORAMINOTOMY  LUMBAR TWO- LUMBAR THREE, LUMBAR THREE- LUMBAR FOUR;  Surgeon: Ashok Pall, MD;  Location: [facility];  Service: Neurosurgery;  Laterality: N/A;   nerve transfer surgery to lwft hand Left    Nov. 2017   SHOULDER ARTHROSCOPY W/ ROTATOR CUFF REPAIR Left 2013   rotator cuff    SHOULDER INJECTION  03/2011   left, [facility]   SKIN CANCER EXCISION  07/1993   left lateral arm   SUPERFICIAL KERATECTOMY Right 05/1998   wrist   thumb injection  03/2011   left, [place]   TONSILLECTOMY AND ADENOIDECTOMY     TOTAL HIP ARTHROPLASTY Left 07/08/2018   TOTAL HIP ARTHROPLASTY Left 07/08/2018   Procedure: LEFT TOTAL HIP ARTHROPLASTY;  Surgeon: Marchia Bond, MD;  Location: [facility];  Service: Orthopedics;  Laterality: Left;   TOTAL SHOULDER ARTHROPLASTY Left 12/25/2016   Procedure: TOTAL SHOULDER ARTHROPLASTY;  Surgeon: Marchia Bond, MD;  Location: [place];  Service: Orthopedics;  Laterality: Left;   TOTAL SHOULDER ARTHROPLASTY Right 11/05/2017   TOTAL SHOULDER ARTHROPLASTY Right 11/05/2017   Procedure: TOTAL SHOULDER ARTHROPLASTY;  Surgeon: Marchia Bond, MD;  Location: [facility];  Service: Orthopedics;  Laterality: Right;   ulnar nerve transfer  12/1999   left   ULNAR  NERVE TRANSPOSITION Left        Family History  Problem Relation Age of Onset   Dementia Mother 49       vascular   Transient ischemic attack Mother    Dementia Father        after hip fracture   Hypertension Sister        skin cancer   Seizures Neg Hx     Social History   Tobacco Use   Smoking status: Former    Packs/day: 2.00    Years: 13.00    Pack years: 26.00    Types: Cigarettes    Quit date: 03/05/1968    Years since quitting: 53.6   Smokeless tobacco: Never  Vaping Use   Vaping Use: Never used  Substance Use Topics   Alcohol use: Yes    Comment: social   Drug use: No    Home Medications Prior to Admission medications   Medication Sig Start  Date End Date Taking? Authorizing Provider  acetaminophen (TYLENOL) 650 MG CR tablet 2 tablets as needed    [provider]  amLODipine (NORVASC) 5 MG tablet TAKE 1 TABLET DAILY 06/08/19   [provider]  apixaban (ELIQUIS) 5 MG TABS tablet Take 1 tablet (5 mg total) by mouth 2 (two) times daily. 04/21/21   Burnell Blanks, MD  aspirin 81 MG chewable tablet 1 tab    [provider]  atorvastatin (LIPITOR) 80 MG tablet 1 tablet    [provider]  baclofen (LIORESAL) 10 MG tablet Take 1 tablet (10 mg total) by mouth 3 (three) times daily. 08/22/21   Evlyn Courier, PA-C  cephALEXin (KEFLEX) 500 MG capsule Take 1 (one) capsule by mouth three times daily 06/26/21     isosorbide dinitrate (ISORDIL) 30 MG tablet 1 tablet    [provider]  isosorbide mononitrate (IMDUR) 30 MG 24 hr tablet TAKE 1 TABLET TWICE A DAY (DOSE INCREASE) 11/08/20   Burnell Blanks, MD  labetalol (NORMODYNE) 200 MG tablet Take 100 mg by mouth 2 (two) times daily.    [provider]  latanoprost (XALATAN) 0.005 % ophthalmic solution 1 drop into affected eye in the evening    [provider]  loratadine (CLARITIN) 10 MG tablet Take 1 tablet (10 mg total) by mouth daily for 14 days.  06/02/21     losartan (COZAAR) 50 MG tablet Take 1 tablet by mouth daily. 02/25/20   [provider]  Multiple Vitamins-Minerals (PRESERVISION AREDS 2) CAPS Take 1 capsule by mouth 2 (two) times daily.     [provider]  nitroGLYCERIN (NITROSTAT) 0.4 MG SL tablet PLACE 1 TABLET (0.4 MG TOTAL) UNDER THE TONGUE EVERY 5 (FIVE) MINUTES X 3 DOSES AS NEEDED FOR CHEST PAIN. 03/03/21   Burnell Blanks, MD  ROCKLATAN 0.02-0.005 % SOLN INSTILL 1 DROP INTO THE RIGHT EYE QHS 04/10/19   [provider]  traMADol (ULTRAM) 50 MG tablet Take 1 tablet by mouth every 6 hours as needed for postop pain 05/18/21       Allergies    Maprotiline and Tetracycline hcl  Review of Systems   Review of Systems  Cardiovascular:  Positive for chest pain.  All other systems reviewed and are negative.  Physical Exam Updated Vital Signs BP 128/81    Pulse 66    Temp 98.1 F (36.7 C) (Oral)    Resp 17    Ht 1.88 m (6\' 2" )    Wt 95.3 kg    SpO2 96%    BMI 26.96 kg/m   Physical Exam Vitals and nursing note reviewed.  Constitutional:      General: He is not in acute distress.    Appearance: He is well-developed.  HENT:     Head: Normocephalic.     Comments: Small laceration approx 1 cm above left eye    Right Ear: External ear normal.     Left Ear: External ear normal.  Eyes:     General: No scleral icterus.       Right eye: No discharge.        Left eye: No discharge.     Conjunctiva/sclera: Conjunctivae normal.  Neck:     Trachea: No tracheal deviation.  Cardiovascular:     Rate and Rhythm: Normal rate and regular rhythm.  Pulmonary:     Effort: Pulmonary effort is normal. No respiratory distress.     Breath sounds: Normal breath sounds. No stridor.  No wheezing or rales.  Chest:     Chest wall: Tenderness present. No deformity.  Abdominal:     General: Bowel sounds are normal. There is no distension.     Palpations: Abdomen is soft.     Tenderness: There is no abdominal  tenderness. There is no guarding or rebound.  Musculoskeletal:        General: No deformity.     Right wrist: Tenderness present.     Cervical back: Normal and neck supple.     Thoracic back: Normal.     Lumbar back: Normal.  Skin:    General: Skin is warm and dry.     Findings: No rash.  Neurological:     General: No focal deficit present.     Mental Status: He is alert.     Cranial Nerves: No cranial nerve deficit (no facial droop, extraocular movements intact, no slurred speech).     Sensory: No sensory deficit.     Motor: No abnormal muscle tone or seizure activity.     Coordination: Coordination normal.  Psychiatric:        Mood and Affect: Mood normal.    ED Results / Procedures / Treatments   Labs (all labs ordered are listed, but only abnormal results are displayed) Labs Reviewed  CBC - Abnormal; Notable for the following components:      Result Value   RBC 3.82 (*)    Hemoglobin 12.4 (*)    HCT 36.8 (*)    All other components within normal limits  BASIC METABOLIC PANEL - Abnormal; Notable for the following components:   Potassium 3.4 (*)    Glucose, Bld 108 (*)    Calcium 8.8 (*)    All other components within normal limits  TROPONIN I (HIGH SENSITIVITY)  TROPONIN I (HIGH SENSITIVITY)    EKG None  Radiology DG Ribs Unilateral W/Chest Left  Result Date: 10/10/2021 CLINICAL DATA:  [place], left chest pain EXAM: LEFT RIBS AND CHEST - 3+ VIEW COMPARISON:  09/30/2021 FINDINGS: Frontal view of the chest as well as frontal and oblique views of the left thoracic cage are obtained. Cardiac silhouette is unremarkable. No acute airspace disease, effusion, or pneumothorax. There are no acute displaced fractures. Postsurgical changes from bilateral shoulder arthroplasties and lower cervical ACDF. IMPRESSION: 1. No acute intrathoracic process. 2. No acute displaced rib fracture. Electronically Signed   By: Randa Ngo M.D.   On: 10/10/2021 20:56   DG Wrist Complete  Right  Result Date: 10/10/2021 CLINICAL DATA:  Fall, right wrist pain EXAM: RIGHT WRIST - COMPLETE 3+ VIEW COMPARISON:  None. FINDINGS: Degenerative changes in the wrist. Fusion within the mid carpal bones. Fall moderate to advanced degenerative changes of the 1st carpometacarpal joint. No acute bony abnormality. Specifically, no fracture, subluxation, or dislocation. IMPRESSION: No acute bony abnormality. Electronically Signed   By: Rolm Baptise M.D.   On: 10/10/2021 21:28   CT Head Wo Contrast  Result Date: 10/10/2021 CLINICAL DATA:  Fall, left eyebrow laceration, on Eliquis EXAM: CT HEAD WITHOUT CONTRAST CT CERVICAL SPINE WITHOUT CONTRAST TECHNIQUE: Multidetector CT imaging of the head and cervical spine was performed following the standard protocol without intravenous contrast. Multiplanar CT image reconstructions of the cervical spine were also generated. COMPARISON:  03/27/1999 FINDINGS: CT HEAD FINDINGS Brain: No evidence of acute infarction, hemorrhage, hydrocephalus, extra-axial collection or mass lesion/mass effect. Mild subcortical white matter and periventricular small vessel ischemic changes. Vascular: Intracranial atherosclerosis. Skull: Normal. Negative for fracture or focal  lesion. Sinuses/Orbits: The visualized paranasal sinuses are essentially clear. The mastoid air cells are unopacified. Other: Mild soft tissue swelling along the left lateral zygoma (series 2/image 9). CT CERVICAL SPINE FINDINGS Alignment: Straightening of the cervical spine. Skull base and vertebrae: No acute fracture. No primary bone lesion or focal pathologic process. Soft tissues and spinal canal: No prevertebral fluid or swelling. No visible canal hematoma. Disc levels: Status post ACDF at C5-6 and C6-7. Spinal canal is patent. Upper chest: Visualized thyroid is unremarkable. Other: None. IMPRESSION: Mild soft tissue swelling along the left lateral zygoma. No evidence of acute intracranial abnormality. Mild small  vessel ischemic changes. No evidence of traumatic injury to the cervical spine. Status post C5-7 ACDF. Electronically Signed   By: Julian Hy M.D.   On: 10/10/2021 21:36   CT Cervical Spine Wo Contrast  Result Date: 10/10/2021 CLINICAL DATA:  Fall, left eyebrow laceration, on Eliquis EXAM: CT HEAD WITHOUT CONTRAST CT CERVICAL SPINE WITHOUT CONTRAST TECHNIQUE: Multidetector CT imaging of the head and cervical spine was performed following the standard protocol without intravenous contrast. Multiplanar CT image reconstructions of the cervical spine were also generated. COMPARISON:  03/27/1999 FINDINGS: CT HEAD FINDINGS Brain: No evidence of acute infarction, hemorrhage, hydrocephalus, extra-axial collection or mass lesion/mass effect. Mild subcortical white matter and periventricular small vessel ischemic changes. Vascular: Intracranial atherosclerosis. Skull: Normal. Negative for fracture or focal lesion. Sinuses/Orbits: The visualized paranasal sinuses are essentially clear. The mastoid air cells are unopacified. Other: Mild soft tissue swelling along the left lateral zygoma (series 2/image 9). CT CERVICAL SPINE FINDINGS Alignment: Straightening of the cervical spine. Skull base and vertebrae: No acute fracture. No primary bone lesion or focal pathologic process. Soft tissues and spinal canal: No prevertebral fluid or swelling. No visible canal hematoma. Disc levels: Status post ACDF at C5-6 and C6-7. Spinal canal is patent. Upper chest: Visualized thyroid is unremarkable. Other: None. IMPRESSION: Mild soft tissue swelling along the left lateral zygoma. No evidence of acute intracranial abnormality. Mild small vessel ischemic changes. No evidence of traumatic injury to the cervical spine. Status post C5-7 ACDF. Electronically Signed   By: Julian Hy M.D.   On: 10/10/2021 21:36    Procedures .Marland KitchenLaceration Repair  Date/Time: 10/10/2021 11:54 PM Performed by: Dorie Rank, MD Authorized by:  Dorie Rank, MD   Anesthesia:    Anesthesia method:  None Laceration details:    Location: face.   Length (cm):  1 Exploration:    Wound extent: no areolar tissue violation noted, no fascia violation noted, no foreign bodies/material noted and no underlying fracture noted     Contaminated: no   Treatment:    Area cleansed with:  Saline   Amount of cleaning:  Standard Skin repair:    Repair method:  Tissue adhesive Approximation:    Approximation:  Close Repair type:    Repair type:  Simple Post-procedure details:    Dressing:  Open (no dressing)   Procedure completion:  Tolerated well, no immediate complications   Medications Ordered in ED Medications  acetaminophen (TYLENOL) tablet 650 mg (650 mg Oral Given 10/10/21 2102)    ED Course  I have reviewed the triage vital signs and the nursing notes.  Pertinent labs & imaging results that were available during my care of the patient were reviewed by me and considered in my medical decision making (see chart for details).    MDM Rules/Calculators/A&P  Patient presented to the ED for evaluation of injuries and pain after a fall.  Patient did sustain a minor laceration but this was treated with Dermabond.  Patient started having chest pain and he was concerned that it could be related to his heart.  ED work-up is reassuring.  Serial troponins are normal.  He does have some palpable chest wall tenderness and I suspect this pain is related to his fall and not related to acute coronary syndrome.  No signs of serious injury on head CT or C-spine CT.Marland Kitchen  No signs of wrist fracture.  Consistent with soft tissue injury.  At this time.  Stable for discharge   Final Clinical Impression(s) / ED Diagnoses Final diagnoses:  Facial laceration, initial encounter  Contusion of chest wall, unspecified laterality, initial encounter  Fall, initial encounter    Rx / DC Orders ED Discharge Orders     None         Dorie Rank, MD 10/10/21 2355

## 2021-10-10 NOTE — ED Notes (Signed)
While drawing reflex troponin, patient stated the blood draw was making his chest pain worse.  EKG repeated and given to ED physician Dr. Tomi Bamberger.

## 2021-10-10 NOTE — ED Notes (Signed)
Patient discharged to home.  All discharge instructions reviewed.  Patient verbalized understanding via teachback method.  VS WDL.  Respirations even and unlabored.  Ambulatory out of ED.   °

## 2021-11-02 ENCOUNTER — Other Ambulatory Visit: Payer: Self-pay | Admitting: Cardiovascular Disease

## 2021-11-24 ENCOUNTER — Other Ambulatory Visit (HOSPITAL_BASED_OUTPATIENT_CLINIC_OR_DEPARTMENT_OTHER): Payer: Self-pay

## 2021-12-20 ENCOUNTER — Other Ambulatory Visit: Payer: Self-pay | Admitting: Cardiovascular Disease

## 2021-12-20 NOTE — Telephone Encounter (Addendum)
Eliquis 5mg  refill request received. Patient is 86 years old, weight-95.3kg, Crea-0.81 on 10/10/2021, Diagnosis-Afib, and last seen by Dr. Angelena Form on 12/26/2020 (pt will need an appt soon will send an appt to schedulers). Dose is appropriate based on dosing criteria. Eliquis refill sent. Message sent to schedulers to reach out since he is due an appt. Will send a limited refill at this time to avoid missed doses.  12/21/21-pt has an appt on 01/18/22 at 1040am.

## 2022-01-18 ENCOUNTER — Encounter: Payer: Self-pay | Admitting: Cardiovascular Disease

## 2022-01-18 ENCOUNTER — Other Ambulatory Visit: Payer: Self-pay

## 2022-01-18 ENCOUNTER — Ambulatory Visit (INDEPENDENT_AMBULATORY_CARE_PROVIDER_SITE_OTHER): Payer: Medicare Other | Admitting: Cardiovascular Disease

## 2022-01-18 VITALS — BP 114/64 | HR 60 | Ht 74.0 in | Wt 224.4 lb

## 2022-01-18 DIAGNOSIS — I10 Essential (primary) hypertension: Secondary | ICD-10-CM | POA: Diagnosis not present

## 2022-01-18 DIAGNOSIS — I25118 Atherosclerotic heart disease of native coronary artery with other forms of angina pectoris: Secondary | ICD-10-CM

## 2022-01-18 DIAGNOSIS — E7800 Pure hypercholesterolemia, unspecified: Secondary | ICD-10-CM

## 2022-01-18 DIAGNOSIS — I480 Paroxysmal atrial fibrillation: Secondary | ICD-10-CM | POA: Diagnosis not present

## 2022-01-18 NOTE — Patient Instructions (Signed)

## 2022-01-18 NOTE — Progress Notes (Signed)
? ?Chief Complaint  ?Patient presents with  ? Follow-up  ?  CAD  ? ?History of Present Illness: 86 yo male with history of HTN, HLD, paroxysmal atrial fibrillation, TIA, MGUS and CAD here today for cardiac follow up. He had been followed by Dr. Atilano Median in [facility] until June 2019. He was admitted to [facility] in June 2019 with fluttering in his chest with associated chest pain. Troponin was negative. Nuclear stress test with possible ischemia. Cardiac cath June 2019 with mild to moderate non-obstructive disease in the LAD, mild disease in the RCA. Echo 04/15/18 with LVEF=60-65%. No significant valve disease. He was discharged on Imdur. He is on Eliquis for paroxysmal atrial fibrillation. Coronary CTA July 2021 with mild non-obstructive disease in all three major vessels.  ? ?He is here today for follow up. The patient denies any chest pain, dyspnea, palpitations, lower extremity edema, orthopnea, PND, dizziness, near syncope or syncope.  ? ?Primary Care Physician: Debroah Loop, PA-C ? ?Past Medical History:  ?Diagnosis Date  ? Arthritis   ? CAD (coronary artery disease)   ? 04/15/18 65% dLAD, 20% pLAD, 30% 2nd diag, Normal EF  ? Dysrhythmia   ? afib  ? Gallstones 07/1996  ? Headache   ? couple times a year  ? History of CT scan of head 1997  ? small vessel disease  ? History of ETT 05/2002   ? no EKG changes  ? History of MRI of cervical spine 07/15/2002  ? Hypertension   ? Lumbar spondylolysis 12/17/2016  ? MGUS (monoclonal gammopathy of unknown significance) 04/11/2012  ? Occipital neuralgia of left side 12/09/2018  ? Peripheral neuropathy   ? Pneumonia 10/2014  ? PONV (postoperative nausea and vomiting)   ? once many years ago  ? Primary localized osteoarthritis of left hip 07/08/2018  ? Primary localized osteoarthrosis of left shoulder 12/25/2016  ? Primary localized osteoarthrosis of right shoulder 11/05/2017  ? Rectal bleeding 12/2002  ? colonoscopy  ? Seizures ([place])   ? last seizure 1976  ? Skin cancer   ? Sleep apnea   ?  uses CPAP  ? Spinal stenosis, lumbar region with neurogenic claudication   ? Stroke [facility])   ? ?tia ?afib  ? TIA (transient ischemic attack)   ? Ulnar neuropathy at elbow 12/24/2014  ? Left  ? 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 ? ?Past Surgical History:  ?Procedure Laterality Date  ? ANTERIOR CERVICAL DECOMP/DISCECTOMY FUSION N/A 02/04/2015  ? Procedure: ANTERIOR CERVICAL DECOMPRESSION/DISCECTOMY FUSION CERVICAL FIVE-SIX,CERVICAL SIX-SEVEN;  Surgeon: Karie Chimera, MD;  Location: [place] NEURO ORS;  Service: Neurosurgery;  Laterality: N/A;  ? arthroscopic knee surgery    ? BACK SURGERY    ? CATARACT EXTRACTION Bilateral   ? COLONOSCOPY W/ POLYPECTOMY  04/19/2003  ? tubular adenoma  ? EYE SURGERY    ? Bilateral Cataracts  ? GREAT TOE ARTHRODESIS, INTERPHALANGEAL JOINT Left 1978  ? JOINT REPLACEMENT    ? joint replacement on right thumb Right   ? LEFT HEART CATH AND CORONARY ANGIOGRAPHY N/A 04/15/2018  ? Procedure: LEFT HEART CATH AND CORONARY ANGIOGRAPHY;  Surgeon: Troy Sine, MD;  Location: [place] CV LAB;  Service: Cardiovascular;  Laterality: N/A;  ? LUMBAR LAMINECTOMY/DECOMPRESSION MICRODISCECTOMY N/A 07/19/2017  ? Procedure: LAMINECTOMY AND FORAMINOTOMY  LUMBAR TWO- LUMBAR THREE, LUMBAR THREE- LUMBAR FOUR;  Surgeon: Ashok Pall, MD;  Location: [place];  Service: Neurosurgery;  Laterality: N/A;  ? nerve transfer surgery to lwft hand  Left   ? Nov. 2017  ? SHOULDER ARTHROSCOPY W/ ROTATOR CUFF REPAIR Left 2013  ? rotator cuff   ? SHOULDER INJECTION  03/2011  ? left, [facility]  ? SKIN CANCER EXCISION  07/1993  ? left lateral arm  ? SUPERFICIAL KERATECTOMY Right 05/1998  ? wrist  ? thumb injection  03/2011  ? left, [facility]  ? TONSILLECTOMY AND ADENOIDECTOMY    ? TOTAL HIP ARTHROPLASTY Left 07/08/2018  ? TOTAL HIP ARTHROPLASTY Left 07/08/2018  ? Procedure: LEFT TOTAL HIP ARTHROPLASTY;  Surgeon: Marchia Bond, MD;  Location: [place];  Service: Orthopedics;  Laterality: Left;  ? TOTAL SHOULDER  ARTHROPLASTY Left 12/25/2016  ? Procedure: TOTAL SHOULDER ARTHROPLASTY;  Surgeon: Marchia Bond, MD;  Location: [place];  Service: Orthopedics;  Laterality: Left;  ? TOTAL SHOULDER ARTHROPLASTY Right 11/05/2017  ? TOTAL SHOULDER ARTHROPLASTY Right 11/05/2017  ? Procedure: TOTAL SHOULDER ARTHROPLASTY;  Surgeon: Marchia Bond, MD;  Location: [place];  Service: Orthopedics;  Laterality: Right;  ? ulnar nerve transfer  12/1999  ? left  ? ULNAR NERVE TRANSPOSITION Left   ? ? ?Current Outpatient Medications  ?Medication Sig Dispense Refill  ? acetaminophen (TYLENOL) 650 MG CR tablet 2 tablets as needed    ? amLODipine (NORVASC) 5 MG tablet TAKE 1 TABLET DAILY    ? apixaban (ELIQUIS) 5 MG TABS tablet TAKE 1 TABLET TWICE A DAY 180 tablet 0  ? aspirin 81 MG chewable tablet 1 tab    ? atorvastatin (LIPITOR) 80 MG tablet 1 tablet    ? isosorbide mononitrate (IMDUR) 30 MG 24 hr tablet TAKE 1 TABLET TWICE A DAY (DOSE INCREASE) 180 tablet 3  ? labetalol (NORMODYNE) 200 MG tablet Take 100 mg by mouth 2 (two) times daily.    ? loratadine (CLARITIN) 10 MG tablet Take 1 tablet (10 mg total) by mouth daily for 14 days. 14 tablet 0  ? losartan (COZAAR) 50 MG tablet Take 1 tablet by mouth daily.    ? Multiple Vitamins-Minerals (PRESERVISION AREDS 2) CAPS Take 1 capsule by mouth 2 (two) times daily.     ? nitroGLYCERIN (NITROSTAT) 0.4 MG SL tablet PLACE 1 TABLET (0.4 MG TOTAL) UNDER THE TONGUE EVERY 5 (FIVE) MINUTES X 3 DOSES AS NEEDED FOR CHEST PAIN. 75 tablet 2  ? ROCKLATAN 0.02-0.005 % SOLN INSTILL 1 DROP INTO THE RIGHT EYE QHS    ? ?No current facility-administered medications for this visit.  ? ? ?Allergies  ?Allergen Reactions  ? Maprotiline Other (See Comments)  ?  TETRACYCLIC ANTIDEPRESSANTS ?UNSPECIFIED REACTION (patient is unaware of allergy) ?TETRACYCLIC ANTIDEPRESSANTS ?UNSPECIFIED REACTION (patient is unaware of allergy)  ? Tetracycline Hcl Itching and Rash  ?  Rash on hands  ? ? ?Social History  ? ?Socioeconomic History  ?  Marital status: Married  ?  Spouse name: [name]  ? Number of children: 2  ? Years of education: 7  ? Highest education level: Not on file  ?Occupational History  ? Occupation: retired  ?  Employer: UNEMPLOYED  ?Tobacco Use  ? Smoking status: Former  ?  Packs/day: 2.00  ?  Years: 13.00  ?  Pack years: 26.00  ?  Types: Cigarettes  ?  Quit date: 03/05/1968  ?  Years since quitting: 53.9  ? Smokeless tobacco: Never  ?Vaping Use  ? Vaping Use: Never used  ?Substance and Sexual Activity  ? Alcohol use: Yes  ?  Comment: social  ? Drug use: No  ? Sexual activity: Not Currently  ?  Other Topics Concern  ? Not on file  ?Social History Narrative  ? Lives with 4th wife, [name], married 2010  ? Retired, Deal[employer]  ? [employer]  ? Son, Pettit, [name], 2 children  ? Daughter, [name], in [place], 2 children  ? Patient is right handed  ? Patient drinks approximately 3-4 cups of caffeine daily  ? ?Social Determinants of Health  ? ?Financial Resource Strain: Not on file  ?Food Insecurity: Not on file  ?Transportation Needs: Not on file  ?Physical Activity: Not on file  ?Stress: Not on file  ?Social Connections: Not on file  ?Intimate Partner Violence: Not on file  ? ? ?Family History  ?Problem Relation Age of Onset  ? Dementia Mother 43  ?     vascular  ? Transient ischemic attack Mother   ? Dementia Father   ?     after hip fracture  ? Hypertension Sister   ?     skin cancer  ? Seizures Neg Hx   ? ? ?Review of Systems:  As stated in the HPI and otherwise negative.  ? ?BP 114/64   Pulse 60   Ht '6\' 2"'$  (1.88 m)   Wt 224 lb 6.4 oz (101.8 kg)   SpO2 98%   BMI 28.81 kg/m?  ? ?Physical Examination: ? ?General: Well developed, well nourished, NAD  ?HEENT: OP clear, mucus membranes moist  ?SKIN: warm, dry. No rashes. ?Neuro: No focal deficits  ?Musculoskeletal: Muscle strength 5/5 all ext  ?Psychiatric: Mood and affect normal  ?Neck: No JVD, no carotid bruits, no thyromegaly, no  lymphadenopathy.  ?Lungs:Clear bilaterally, no wheezes, rhonci, crackles ?Cardiovascular: Regular rate and rhythm. No murmurs, gallops or rubs. ?Abdomen:Soft. Bowel sounds present. Non-tender.  ?Extremities: No lower extrem

## 2022-01-19 ENCOUNTER — Telehealth: Payer: Self-pay | Admitting: Cardiovascular Disease

## 2022-02-16 ENCOUNTER — Other Ambulatory Visit (HOSPITAL_BASED_OUTPATIENT_CLINIC_OR_DEPARTMENT_OTHER): Payer: Self-pay

## 2022-02-16 MED ORDER — METHYLPREDNISOLONE 4 MG PO TBPK
ORAL_TABLET | ORAL | 0 refills | Status: DC
  Filled 2022-02-16: qty 21, 6d supply, fill #0

## 2022-03-28 DIAGNOSIS — K219 Gastro-esophageal reflux disease without esophagitis: Secondary | ICD-10-CM | POA: Insufficient documentation

## 2022-03-28 DIAGNOSIS — R040 Epistaxis: Secondary | ICD-10-CM | POA: Insufficient documentation

## 2022-04-16 ENCOUNTER — Other Ambulatory Visit (HOSPITAL_BASED_OUTPATIENT_CLINIC_OR_DEPARTMENT_OTHER): Payer: Self-pay

## 2022-04-16 DIAGNOSIS — F0280 Dementia in other diseases classified elsewhere without behavioral disturbance: Secondary | ICD-10-CM | POA: Insufficient documentation

## 2022-04-16 MED ORDER — CHLORHEXIDINE GLUCONATE 0.12 % MT SOLN
OROMUCOSAL | 0 refills | Status: DC
  Filled 2022-04-16: qty 473, 15d supply, fill #0

## 2022-04-16 MED ORDER — DONEPEZIL HCL 5 MG PO TABS
ORAL_TABLET | ORAL | 1 refills | Status: DC
  Filled 2022-04-16: qty 30, 30d supply, fill #0
  Filled 2022-05-14: qty 30, 30d supply, fill #1

## 2022-05-14 ENCOUNTER — Other Ambulatory Visit (HOSPITAL_BASED_OUTPATIENT_CLINIC_OR_DEPARTMENT_OTHER): Payer: Self-pay

## 2022-05-23 ENCOUNTER — Telehealth: Payer: Self-pay | Admitting: *Deleted

## 2022-05-23 NOTE — Telephone Encounter (Signed)
Pt agreeable to plan of care for tele pre op 05/24/22 add on per pre op provider today. Pt tele appt 05/24/22 @ 3:20. Med rec and consent are done.   Per pre op provider today the pt has been advised to start holding ASA tomorrow as long as he has been chest pain free.   Pt denies any chest pain, sob or swelling. Pt agreeable to begin holding ASA as of tomorrow. Pt informed that we will let him know tomorrow when to begin to hold ASA. Pt thanked me for the call and the help today.     Patient Consent for Virtual Visit        Jose [name]. has provided verbal consent on 05/23/2022 for a virtual visit (video or telephone).   CONSENT FOR VIRTUAL VISIT FOR:  Jose [name].  By participating in this virtual visit I agree to the following:  I hereby voluntarily request, consent and authorize [facility] and its employed or contracted physicians, physician assistants, nurse practitioners or other licensed health care professionals (the Practitioner), to provide me with telemedicine health care services (the "Services") as deemed necessary by the treating Practitioner. I acknowledge and consent to receive the Services by the Practitioner via telemedicine. I understand that the telemedicine visit will involve communicating with the Practitioner through live audiovisual communication technology and the disclosure of certain medical information by electronic transmission. I acknowledge that I have been given the opportunity to request an in-person assessment or other available alternative prior to the telemedicine visit and am voluntarily participating in the telemedicine visit.  I understand that I have the right to withhold or withdraw my consent to the use of telemedicine in the course of my care at any time, without affecting my right to future care or treatment, and that the Practitioner or I may terminate the telemedicine visit at any time. I understand that I have the right to inspect all  information obtained and/or recorded in the course of the telemedicine visit and may receive copies of available information for a reasonable fee.  I understand that some of the potential risks of receiving the Services via telemedicine include:  Delay or interruption in medical evaluation due to technological equipment failure or disruption; Information transmitted may not be sufficient (e.g. poor resolution of images) to allow for appropriate medical decision making by the Practitioner; and/or  In rare instances, security protocols could fail, causing a breach of personal health information.  Furthermore, I acknowledge that it is my responsibility to provide information about my medical history, conditions and care that is complete and accurate to the best of my ability. I acknowledge that Practitioner's advice, recommendations, and/or decision may be based on factors not within their control, such as incomplete or inaccurate data provided by me or distortions of diagnostic images or specimens that may result from electronic transmissions. I understand that the practice of medicine is not an exact science and that Practitioner makes no warranties or guarantees regarding treatment outcomes. I acknowledge that a copy of this consent can be made available to me via my patient portal ([facility]), or I can request a printed copy by calling the office of [facility].    I understand that my insurance will be billed for this visit.   I have read or had this consent read to me. I understand the contents of this consent, which adequately explains the benefits and risks of the Services being provided via telemedicine.  I  have been provided ample opportunity to ask questions regarding this consent and the Services and have had my questions answered to my satisfaction. I give my informed consent for the services to be provided through the use of telemedicine in my medical care

## 2022-05-23 NOTE — Telephone Encounter (Signed)
Pt agreeable to plan of care for tele pre op 05/24/22 add on per pre op provider today. Pt tele appt 05/24/22 @ 3:20. Med rec and consent are done.   Per pre op provider today the pt has been advised to start holding ASA tomorrow as long as he has been chest pain free.   Pt denies any chest pain, sob or swelling. Pt agreeable to begin holding ASA as of tomorrow. Pt informed that we will let him know tomorrow when to begin to hold ASA. Pt thanked me for the call and the help today.

## 2022-05-23 NOTE — Telephone Encounter (Signed)
Clinical pharmacist to review Eliquis 

## 2022-05-23 NOTE — Telephone Encounter (Signed)
   Pre-operative Risk Assessment    Patient Name: Jose [name].  DOB: [birth year] MRN: [id number]      Request for Surgical Clearance    Procedure:   RIGHT FOOT DEEP ORTHOPEDIC HARDWARE REMOVAL, SUPERFICIAL PERONEAL NERVE NEUROPLASTY   Date of Surgery:  Clearance 05/29/22                                 Surgeon:  DR. Radene Journey Surgeon's Group or Practice Name:  [facility] Phone number:  [phone] Fax number:  [phone] ATTN: [name]   Type of Clearance Requested:   - Medical  - Pharmacy:  Hold Aspirin and Apixaban (Eliquis)     Type of Anesthesia:   CHOICE   Additional requests/questions:    [name]   05/23/2022, 12:08 PM

## 2022-05-23 NOTE — Telephone Encounter (Signed)
Please arrange a virtual telephone visit for cardiac clearance.  Please notify the patient to start holding aspirin tomorrow morning as long as he is chest pain-free.  We will need telephone visit at the latest this Friday for final cardiac clearance.

## 2022-05-23 NOTE — Telephone Encounter (Signed)
Patient with diagnosis of afib on Eliquis for anticoagulation.    Procedure:  RIGHT FOOT DEEP ORTHOPEDIC HARDWARE REMOVAL, SUPERFICIAL PERONEAL NERVE NEUROPLASTY  Date of procedure: 05/29/22   CHA2DS2-VASc Score = 7   This indicates a 11.2% annual risk of stroke. The patient's score is based upon: CHF History: 0 HTN History: 1 Diabetes History: 1 Stroke History: 2 Vascular Disease History: 1 Age Score: 2 Gender Score: 0      CrCl 91 ml/min  Patient previously approved to hold Eliquis for 3 days back in 2021. ACC/AHA recommends against bridging with DOAC.   Patient can hold Eliquis for 2 days prior to procedure.    **This guidance is not considered finalized until pre-operative APP has relayed final recommendations.**

## 2022-05-24 ENCOUNTER — Ambulatory Visit (INDEPENDENT_AMBULATORY_CARE_PROVIDER_SITE_OTHER): Payer: Medicare Other | Admitting: Nurse Practitioner

## 2022-05-24 DIAGNOSIS — Z01810 Encounter for preprocedural cardiovascular examination: Secondary | ICD-10-CM | POA: Diagnosis not present

## 2022-05-24 NOTE — Progress Notes (Signed)
Virtual Visit via Telephone Note   Because of CLARE CASTO Jr.'s co-morbid illnesses, he is at least at moderate risk for complications without adequate follow up.  This format is felt to be most appropriate for this patient at this time.  The patient did not have access to video technology/had technical difficulties with video requiring transitioning to audio format only (telephone).  All issues noted in this document were discussed and addressed.  No physical exam could be performed with this format.  Please refer to the patient's chart for his consent to telehealth for [facility].  Evaluation Performed:  Preoperative cardiovascular risk assessment _____________   Date:  05/24/2022   Patient ID:  [name], DOB 16-Feb-1936, MRN [id number] Patient Location:  Home Provider location:   Office  Primary Care Provider:  Debroah Loop, PA-C Primary Cardiologist:  Lauree Chandler, MD  Chief Complaint / Patient Profile   86 y.o. y/o male with a h/o paroxysmal atrial fibrillation, nonobstructive CAD, hypertension, hyperlipidemia, TIA, and MGUS who is pending R foot deep orthopedic hardware removal, superficial peroneal nerve neuroplasty on 05/29/2022 with Dr. Radene Journey of [place] and presents today for telephonic preoperative cardiovascular risk assessment.  Past Medical History    Past Medical History:  Diagnosis Date   Arthritis    CAD (coronary artery disease)    04/15/18 65% dLAD, 20% pLAD, 30% 2nd diag, Normal EF   Dysrhythmia    afib   Gallstones 07/1996   Headache    couple times a year   History of CT scan of head 1997   small vessel disease   History of ETT 05/2002    no EKG changes   History of MRI of cervical spine 07/15/2002   Hypertension    Lumbar spondylolysis 12/17/2016   MGUS (monoclonal gammopathy of unknown significance) 04/11/2012   Occipital neuralgia of left side 12/09/2018   Peripheral neuropathy    Pneumonia 10/2014   PONV  (postoperative nausea and vomiting)    once many years ago   Primary localized osteoarthritis of left hip 07/08/2018   Primary localized osteoarthrosis of left shoulder 12/25/2016   Primary localized osteoarthrosis of right shoulder 11/05/2017   Rectal bleeding 12/2002   colonoscopy   Seizures ([place])    last seizure 1976   Skin cancer    Sleep apnea    uses CPAP   Spinal stenosis, lumbar region with neurogenic claudication    Stroke [facility])    ?tia ?afib   TIA (transient ischemic attack)    Ulnar neuropathy at elbow 12/24/2014   Left   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 extremity 03/29/2015   Past Surgical History:  Procedure Laterality Date   ANTERIOR CERVICAL DECOMP/DISCECTOMY FUSION N/A 02/04/2015   Procedure: ANTERIOR CERVICAL DECOMPRESSION/DISCECTOMY FUSION CERVICAL FIVE-SIX,CERVICAL SIX-SEVEN;  Surgeon: Karie Chimera, MD;  Location: [place] NEURO ORS;  Service: Neurosurgery;  Laterality: N/A;   arthroscopic knee surgery     BACK SURGERY     CATARACT EXTRACTION Bilateral    COLONOSCOPY W/ POLYPECTOMY  04/19/2003   tubular adenoma   EYE SURGERY     Bilateral Cataracts   GREAT TOE ARTHRODESIS, INTERPHALANGEAL JOINT Left 1978   JOINT REPLACEMENT     joint replacement on right thumb Right    LEFT HEART CATH AND CORONARY ANGIOGRAPHY N/A 04/15/2018   Procedure: LEFT HEART CATH AND CORONARY ANGIOGRAPHY;  Surgeon: Troy Sine, MD;  Location: [place] CV LAB;  Service: Cardiovascular;  Laterality: N/A;  LUMBAR LAMINECTOMY/DECOMPRESSION MICRODISCECTOMY N/A 07/19/2017   Procedure: LAMINECTOMY AND FORAMINOTOMY  LUMBAR TWO- LUMBAR THREE, LUMBAR THREE- LUMBAR FOUR;  Surgeon: Ashok Pall, MD;  Location: [place];  Service: Neurosurgery;  Laterality: N/A;   nerve transfer surgery to lwft hand Left    Nov. 2017   SHOULDER ARTHROSCOPY W/ ROTATOR CUFF REPAIR Left 2013   rotator cuff    SHOULDER INJECTION  03/2011   left, [facility]   SKIN CANCER EXCISION  07/1993   left lateral arm    SUPERFICIAL KERATECTOMY Right 05/1998   wrist   thumb injection  03/2011   left, [place]   TONSILLECTOMY AND ADENOIDECTOMY     TOTAL HIP ARTHROPLASTY Left 07/08/2018   TOTAL HIP ARTHROPLASTY Left 07/08/2018   Procedure: LEFT TOTAL HIP ARTHROPLASTY;  Surgeon: Marchia Bond, MD;  Location: [place];  Service: Orthopedics;  Laterality: Left;   TOTAL SHOULDER ARTHROPLASTY Left 12/25/2016   Procedure: TOTAL SHOULDER ARTHROPLASTY;  Surgeon: Marchia Bond, MD;  Location: [place];  Service: Orthopedics;  Laterality: Left;   TOTAL SHOULDER ARTHROPLASTY Right 11/05/2017   TOTAL SHOULDER ARTHROPLASTY Right 11/05/2017   Procedure: TOTAL SHOULDER ARTHROPLASTY;  Surgeon: Marchia Bond, MD;  Location: [place];  Service: Orthopedics;  Laterality: Right;   ulnar nerve transfer  12/1999   left   ULNAR NERVE TRANSPOSITION Left     Allergies  Allergies  Allergen Reactions   Maprotiline Other (See Comments)    TETRACYCLIC ANTIDEPRESSANTS UNSPECIFIED REACTION (patient is unaware of allergy) TETRACYCLIC ANTIDEPRESSANTS UNSPECIFIED REACTION (patient is unaware of allergy)   Tetracycline Hcl Itching and Rash    Rash on hands    History of Present Illness    Diangelo Radel. is a 86 y.o. male who presents via audio/video conferencing for a telehealth visit today.  Pt was last seen in cardiology clinic on 01/18/2022 by Dr. Angelena Form.  At that time Reign Bartnick. was doing well. The patient is now pending procedure as outlined above. Since his last visit, he has done well from a cardiac standpoint. He denies chest pain, palpitations, dyspnea, pnd, orthopnea, n, v, dizziness, syncope, edema, weight gain, or early satiety. All other systems reviewed and are otherwise negative except as noted above.   Home Medications    Prior to Admission medications   Medication Sig Start Date End Date Taking? Authorizing Provider  acetaminophen (TYLENOL) 650 MG CR tablet 2 tablets as needed    [provider]   amLODipine (NORVASC) 5 MG tablet TAKE 1 TABLET DAILY 06/08/19   [provider]  apixaban (ELIQUIS) 5 MG TABS tablet TAKE 1 TABLET TWICE A DAY 12/20/21   Burnell Blanks, MD  aspirin 81 MG chewable tablet 1 tab    [provider]  atorvastatin (LIPITOR) 80 MG tablet 1 tablet    [provider]  chlorhexidine (PERIDEX) 0.12 % solution Swish and spit 35ms by mouth 2 times daily after brushing teeth for 30 seconds. 04/16/22     donepezil (ARICEPT) 5 MG tablet Take 1 tablet by mouth nightly 04/16/22     isosorbide mononitrate (IMDUR) 30 MG 24 hr tablet TAKE 1 TABLET TWICE A DAY (DOSE INCREASE) 11/03/21   MBurnell Blanks MD  labetalol (NORMODYNE) 200 MG tablet Take 100 mg by mouth 2 (two) times daily.    [provider]  loratadine (CLARITIN) 10 MG tablet Take 1 tablet (10 mg total) by mouth daily for 14 days. 06/02/21     losartan (COZAAR) 50 MG tablet  Take 1 tablet by mouth daily. 02/25/20   [provider]  methylPREDNISolone (MEDROL) 4 MG TBPK tablet Take by mouth as directed. Take as directed on pack over 6 days. 02/16/22     Multiple Vitamins-Minerals (PRESERVISION AREDS 2) CAPS Take 1 capsule by mouth 2 (two) times daily.     [provider]  nitroGLYCERIN (NITROSTAT) 0.4 MG SL tablet PLACE 1 TABLET (0.4 MG TOTAL) UNDER THE TONGUE EVERY 5 (FIVE) MINUTES X 3 DOSES AS NEEDED FOR CHEST PAIN. 03/03/21   Burnell Blanks, MD  ROCKLATAN 0.02-0.005 % SOLN INSTILL 1 DROP INTO THE RIGHT EYE QHS 04/10/19   [provider]    Physical Exam    Vital Signs:  [name] does not have vital signs available for review today.  Given telephonic nature of communication, physical exam is limited. AAOx3. NAD. Normal affect.  Speech and respirations are unlabored.  Accessory Clinical Findings    None  Assessment & Plan    1.  Preoperative Cardiovascular Risk Assessment:  According to the Revised Cardiac Risk Index  (RCRI), his Perioperative Risk of Major Cardiac Event is (%): 0.9. His Functional Capacity in METs is: 4.79 according to the Duke Activity Status Index (DASI). Therefore, based on ACC/AHA guidelines, patient would be at acceptable risk for the planned procedure without further cardiovascular testing.    Patient with diagnosis of afib on Eliquis for anticoagulation.     Procedure:  RIGHT FOOT DEEP ORTHOPEDIC HARDWARE REMOVAL, SUPERFICIAL PERONEAL NERVE NEUROPLASTY  Date of procedure: 05/29/22     CHA2DS2-VASc Score = 7   This indicates a 11.2% annual risk of stroke. The patient's score is based upon: CHF History: 0 HTN History: 1 Diabetes History: 1 Stroke History: 2 Vascular Disease History: 1 Age Score: 2 Gender Score: 0       CrCl 91 ml/min   Patient previously approved to hold Eliquis for 3 days back in 2021. ACC/AHA recommends against bridging with DOAC.     Patient can hold Eliquis for 2 days prior to procedure. Please resume Eliquis as soon as possible postprocedure, at the discretion of the surgeon.  Patient takes aspirin 81 mg daily.  From a cardiac standpoint, patient okay to hold aspirin for 7 days prior to procedure. However, patient also with history of TIA.  Recommend further recommendations on an aspirin prior to surgery come from PCP/neurology.  A copy of this note will be routed to requesting surgeon.  Time:   Today, I have spent 8 minutes with the patient with telehealth technology discussing medical history, symptoms, and management plan.     Lenna Sciara, NP  05/24/2022, 3:34 PM

## 2022-05-29 ENCOUNTER — Other Ambulatory Visit (HOSPITAL_BASED_OUTPATIENT_CLINIC_OR_DEPARTMENT_OTHER): Payer: Self-pay

## 2022-05-29 MED ORDER — OXYCODONE HCL 5 MG PO TABS
5.0000 mg | ORAL_TABLET | Freq: Four times a day (QID) | ORAL | 0 refills | Status: DC | PRN
  Filled 2022-05-29: qty 20, 5d supply, fill #0

## 2022-05-29 MED ORDER — DONEPEZIL HCL 10 MG PO TABS
ORAL_TABLET | ORAL | 0 refills | Status: DC
  Filled 2022-05-29: qty 30, 30d supply, fill #0

## 2022-06-20 ENCOUNTER — Other Ambulatory Visit (HOSPITAL_BASED_OUTPATIENT_CLINIC_OR_DEPARTMENT_OTHER): Payer: Self-pay

## 2022-06-20 MED ORDER — AMOXICILLIN 500 MG PO CAPS
ORAL_CAPSULE | ORAL | 0 refills | Status: DC
  Filled 2022-06-20: qty 12, 3d supply, fill #0

## 2022-06-21 ENCOUNTER — Other Ambulatory Visit (HOSPITAL_BASED_OUTPATIENT_CLINIC_OR_DEPARTMENT_OTHER): Payer: Self-pay

## 2022-06-21 MED ORDER — MEMANTINE HCL 5 MG PO TABS
ORAL_TABLET | ORAL | 1 refills | Status: DC
  Filled 2022-06-21: qty 60, 40d supply, fill #0
  Filled 2022-06-21: qty 60, 30d supply, fill #0

## 2022-06-21 MED ORDER — DONEPEZIL HCL 10 MG PO TABS
10.0000 mg | ORAL_TABLET | Freq: Every day | ORAL | 0 refills | Status: DC
  Filled 2022-06-21: qty 90, 90d supply, fill #0

## 2022-07-09 ENCOUNTER — Other Ambulatory Visit (HOSPITAL_BASED_OUTPATIENT_CLINIC_OR_DEPARTMENT_OTHER): Payer: Self-pay

## 2022-07-09 MED ORDER — CEPHALEXIN 500 MG PO CAPS
500.0000 mg | ORAL_CAPSULE | Freq: Three times a day (TID) | ORAL | 0 refills | Status: DC
  Filled 2022-07-09: qty 21, 7d supply, fill #0

## 2022-07-23 ENCOUNTER — Other Ambulatory Visit (HOSPITAL_BASED_OUTPATIENT_CLINIC_OR_DEPARTMENT_OTHER): Payer: Self-pay

## 2022-07-23 MED ORDER — MEMANTINE HCL 5 MG PO TABS
ORAL_TABLET | ORAL | 1 refills | Status: DC
  Filled 2022-07-23: qty 60, 30d supply, fill #0

## 2022-07-24 ENCOUNTER — Encounter: Payer: Self-pay | Admitting: *Deleted

## 2022-07-30 ENCOUNTER — Encounter: Payer: Self-pay | Admitting: Neurology

## 2022-07-30 ENCOUNTER — Ambulatory Visit (INDEPENDENT_AMBULATORY_CARE_PROVIDER_SITE_OTHER): Payer: Medicare Other | Admitting: Neurology

## 2022-07-30 VITALS — BP 130/79 | HR 58 | Ht 73.0 in | Wt 223.0 lb

## 2022-07-30 DIAGNOSIS — G4733 Obstructive sleep apnea (adult) (pediatric): Secondary | ICD-10-CM | POA: Diagnosis not present

## 2022-07-30 NOTE — Patient Instructions (Signed)
We can see you in 1 year, you can see one of our nurse practitioners as you are stable.  Please continue using your CPAP regularly. While your insurance requires that you use CPAP at least 4 hours each night on 70% of the nights, I recommend, that you not skip any nights and use it throughout the night if you can. Getting used to CPAP and staying with the treatment long term does take time and patience and discipline. Untreated obstructive sleep apnea when it is moderate to severe can have an adverse impact on cardiovascular health and raise her risk for heart disease, arrhythmias, hypertension, congestive heart failure, stroke and diabetes. Untreated obstructive sleep apnea causes sleep disruption, nonrestorative sleep, and sleep deprivation. This can have an impact on your day to day functioning and cause daytime sleepiness and impairment of cognitive function, memory loss, mood disturbance, and problems focussing. Using CPAP regularly can improve these symptoms.

## 2022-07-30 NOTE — Progress Notes (Signed)
Subjective:    Patient ID: Jose [name]. is a 86 y.o. male.  HPI    Interim history:   Mr. Burley is an 86 year old male with an underlying medical history of A. fib, overweight state, reflux disease, recurrent TIA, hypertension, MGUS, ulnar neuropathy, memory loss (followed by Dr. Doy Hutching) and arthritis, who presents for follow-up consultation of his OSA, on CPAP therapy. The patient is accompanied by his wife today and presents for his yearly checkup. I last saw him on 01/11/2021, at which time he was compliant with his CPAP machine.  He was tolerating treatment.  Today, 07/30/2022: I reviewed his CPAP compliance data from 06/26/2022 through 07/25/2022, which is a total of 30 days, during which time he used his machine every night with percent use days greater than 4 hours at 100%, indicating superb compliance with an average usage of 7 hours and 33 minutes, residual AHI at goal at 0.5/h, leak on the higher side with the 95th percentile at 22.4 L/min on a pressure of 12 cm with EPR of 3.  He reports doing well with his CPAP. Reports full compliance and ongoing good results.  He uses a nasal mask.  He has changed the filter monthly typically.  He does need a prescription for new supplies, his DME is [facility].  He has seen orthopedics for right knee pain.  His orthopedic surgeon told him that a knee replacement was not possible.  He usually wears a brace during waking hours and uses a 2 wheeled walker to mobilize.  He has had steroid injections into the knee.   The patient's allergies, current medications, family history, past medical history, past social history, past surgical history and problem list were reviewed and updated as appropriate.    Previously (copied from previous notes for reference):    I reviewed his CPAP compliance data from 12/11/2020 through 01/09/2021, which is a total of 30 days, during which time he used his machine every night with percent use days greater than 4 hours at  100%, indicating superb compliance, average usage of 7 hours and 24 minutes, residual AHI at goal at 0.2/h, leak acceptable with a 95th percentile at 16.5 L/min on a pressure of 12 cm but EPR of 3.      I saw him on 12/21/2019, At which time he was compliant with his CPAP and doing well.   He saw Dr. Jannifer Franklin in the interim for his neuropathy on 01/14/2020.   He saw Butler Denmark, NP, on 07/19/2020, at which time he was eligible for new machine.  A new prescription for a machine was written in November 2021.   He received an AutoPap machine with default settings of 4 to 20 cm.  He called in January 2022 reporting that the pressure was too high.  His pressure was adjusted to a set pressure of 12 cm as before.   I saw him on 12/05/2019, at which time he was compliant with his CPAP and doing well.     I reviewed his CPAP compliance data from 11/16/2019 through 12/15/2019 which is a total of 30 days, during which time he used his machine every night with percent use days greater than 4 hours at 100%, indicating superb compliance with an average usage of 7 hours and 40 minutes, residual AHI at goal at 0.2/h, leak on the higher side with a 95th percentile at 21.8 L/min on a pressure of 12 cm with EPR of 3.      I  saw him on 12/04/2017, at which time he was fully compliant with his CPAP. Of note, he had interim left shoulder replacement surgery on 12/25/2016, then had lower back surgery in September 2018, then had right shoulder replacement in January 2019.   I reviewed his CPAP compliance data from 11/03/2018 through 12/02/2018 which is a total of 30 days, during which time he used his CPAP every night with percent used days greater than 4 hours at 100%, indicating superb compliance with an average usage of 7 hours and 26 minutes, residual AHI at goal at 0.3 per hour, leak on the high side with the 95th percentile at 29.8 L/m on a pressure of 12 cm with EPR of 3.      I saw him on 12/04/2016, at which time he  reported doing well with CPAP. He was supposed to have left total shoulder replacement in February 2018.   I reviewed his CPAP compliance data from 11/03/2017 through 12/02/2017 which is a total of 30 days, during which time he used his CPAP every night with percent used days greater than 4 hours at 100%, indicating superb compliance with an average usage of 7 hours and 28 minutes, residual AHI at goal at 0.3 per hour, leak on the higher end with the 95th percentile at 17.8 L/m on a pressure of 12 cm with EPR of 3.  I saw him on 12/05/2015, at which time we talked about his split-night sleep study results from 03/01/2015 and his compliance with CPAP. He was fully compliant with CPAP therapy. I suggested with a checkup. He saw Dr. Jannifer Franklin in the interim in March 2017. He had been treated in January 2017 for a community-acquired pneumonia. He then presented to the emergency room in January 2017 a balance issues and was diagnosed with vertigo, placed on meclizine as needed.    I reviewed his CPAP compliance data from 11/03/2016 through 12/02/2016, which is a total of 30 days, during which time he used his CPAP every night with percent used days greater than 4 hours at 100%, indicating superb compliance with an average usage of 7 hours and 33 minutes, residual AHI 0.3 per hour, leak low with the 95th percentile at 10.2 L/m on a pressure of 12 cm with EPR of 3.    I first met him on 06/01/2015 at the request of Dr. Jannifer Franklin, at which time we talked about his split-night sleep study results from 03/01/2015 as well as his compliance data. He reported doing well. He had adjusted well to treatment. He felt improved with respect to sleep disruption, daytime somnolence, and sleep quality. He had recent ulnar nerve surgery about 3 weeks prior. He was taking gabapentin for neuropathy. He was seeing a cardiologist out of [place] and was on Eliquis. He goes to bed around 11 PM and his rise time is around 7 AM. He wakes up  better rested. His neuropathy seems stable to him. In the interim, he had neck surgery on 02/04/2015 under Dr. Hal Neer. He feels improved. He has a follow-up with his hand surgeon next week. He has not had any recent TIA type symptoms. He drinks alcohol occasionally. He quit smoking in the late 60s. He drinks quite a bit of coffee, at least 2 12 ounce cups a day.   I reviewed his CPAP compliance data from 11/01/2015 through 11/30/2015 which is a total of 30 days during which time he used his machine every night with percent used days greater than 4 hours  at 100%, indicating superb compliance with an average usage of 7 hours and 29 minutes, residual AHI low at 0.4 per hour, leak borderline with the 95th percentile at 24.3 L/m on a pressure of 12 cm with EPR of 3.   06/01/2015: He was referred for a sleep study secondary to a report of witnessed apneas during his hospitalization in 1/16, and snoring reported. He had a split-night sleep study on 03/01/2015 and went over his test results with him in detail today. His baseline sleep efficiency was reduced at 77% with a latency to sleep of 5.5 minutes and wake after sleep onset of 29.5 minutes with moderate sleep fragmentation noted. He had a markedly elevated arousal index at 73.3 per hour, secondary to respiratory events. He had absence of slow-wave sleep and REM sleep prior to CPAP initiation. He had no significant PLMS, EKG or EEG changes. He had moderate snoring. He had a total AHI highly elevated at 71.3 per hour, average oxygen saturation was 93%, nadir was 83% during non-REM sleep. He was then placed on CPAP therapy. Sleep efficiency was 87.5% with a latency to sleep of 4 minutes and wake after sleep onset of 27 minutes with mild to moderate sleep fragmentation noted. He had a tremendous improvement in his arousal index to 3.6 per hour. He had 12.2% of slow-wave sleep and achieved 28.9% of REM sleep. Average oxygen saturation was 93%, nadir was 81%. He had no  significant PLMS during the treatment portion of the study. CPAP was titrated from 5 cm to 13 cm with a reduction of his AHI to 2.8 per hour at a pressure of 12 cm. Based on his test results I prescribed CPAP therapy for home use.    I reviewed his CPAP compliance data from 05/01/2015 through 05/30/2015 which is a total of 30 days during which time he used his machine every night with percent used days greater than 4 hours at 93%, indicating excellent compliance with an average usage of 6 hours and 32 minutes, residual AHI at 3.4 per hour, leak acceptable with the 95th percentile at 20.2 L/m on a pressure of 12 cm with EPR of 3.   His Past Medical History Is Significant For: Past Medical History:  Diagnosis Date   Arthritis    CAD (coronary artery disease)    04/15/18 65% dLAD, 20% pLAD, 30% 2nd diag, Normal EF   Dysrhythmia    afib   Gallstones 07/1996   Headache    couple times a year   History of CT scan of head 1997   small vessel disease   History of ETT 05/2002    no EKG changes   History of MRI of cervical spine 07/15/2002   Hypertension    Lumbar spondylolysis 12/17/2016   MGUS (monoclonal gammopathy of unknown significance) 04/11/2012   Occipital neuralgia of left side 12/09/2018   Peripheral neuropathy    Pneumonia 10/2014   PONV (postoperative nausea and vomiting)    once many years ago   Primary localized osteoarthritis of left hip 07/08/2018   Primary localized osteoarthrosis of left shoulder 12/25/2016   Primary localized osteoarthrosis of right shoulder 11/05/2017   Rectal bleeding 12/2002   colonoscopy   Seizures (HCC)    last seizure 1976   Skin cancer    Sleep apnea    uses CPAP   Spinal stenosis, lumbar region with neurogenic claudication    Stroke [facility])    ?tia ?afib   TIA (transient ischemic attack)  Ulnar neuropathy at elbow 12/24/2014   Left   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 extremity 03/29/2015    His Past Surgical History Is Significant For: Past  Surgical History:  Procedure Laterality Date   ANTERIOR CERVICAL DECOMP/DISCECTOMY FUSION N/A 02/04/2015   Procedure: ANTERIOR CERVICAL DECOMPRESSION/DISCECTOMY FUSION CERVICAL FIVE-SIX,CERVICAL SIX-SEVEN;  Surgeon: Karie Chimera, MD;  Location: [place] [facility];  Service: Neurosurgery;  Laterality: N/A;   arthroscopic knee surgery     BACK SURGERY     CATARACT EXTRACTION Bilateral    COLONOSCOPY W/ POLYPECTOMY  04/19/2003   tubular adenoma   EYE SURGERY     Bilateral Cataracts   GREAT TOE ARTHRODESIS, INTERPHALANGEAL JOINT Left 1978   JOINT REPLACEMENT     joint replacement on right thumb Right    LEFT HEART CATH AND CORONARY ANGIOGRAPHY N/A 04/15/2018   Procedure: LEFT HEART CATH AND CORONARY ANGIOGRAPHY;  Surgeon: Troy Sine, MD;  Location: [place] [facility];  Service: Cardiovascular;  Laterality: N/A;   LUMBAR LAMINECTOMY/DECOMPRESSION MICRODISCECTOMY N/A 07/19/2017   Procedure: LAMINECTOMY AND FORAMINOTOMY  LUMBAR TWO- LUMBAR THREE, LUMBAR THREE- LUMBAR FOUR;  Surgeon: Ashok Pall, MD;  Location: [place];  Service: Neurosurgery;  Laterality: N/A;   nerve transfer surgery to lwft hand Left    Nov. 2017   SHOULDER ARTHROSCOPY W/ ROTATOR CUFF REPAIR Left 2013   rotator cuff    SHOULDER INJECTION  03/2011   left, [facility]   SKIN CANCER EXCISION  07/1993   left lateral arm   SUPERFICIAL KERATECTOMY Right 05/1998   wrist   thumb injection  03/2011   left, [facility]   TONSILLECTOMY AND ADENOIDECTOMY     TOTAL HIP ARTHROPLASTY Left 07/08/2018   TOTAL HIP ARTHROPLASTY Left 07/08/2018   Procedure: LEFT TOTAL HIP ARTHROPLASTY;  Surgeon: Marchia Bond, MD;  Location: [place];  Service: Orthopedics;  Laterality: Left;   TOTAL SHOULDER ARTHROPLASTY Left 12/25/2016   Procedure: TOTAL SHOULDER ARTHROPLASTY;  Surgeon: Marchia Bond, MD;  Location: [place];  Service: Orthopedics;  Laterality: Left;   TOTAL SHOULDER ARTHROPLASTY Right 11/05/2017   TOTAL SHOULDER ARTHROPLASTY Right 11/05/2017    Procedure: TOTAL SHOULDER ARTHROPLASTY;  Surgeon: Marchia Bond, MD;  Location: [place];  Service: Orthopedics;  Laterality: Right;   ulnar nerve transfer  12/1999   left   ULNAR NERVE TRANSPOSITION Left     His Family History Is Significant For: Family History  Problem Relation Age of Onset   Dementia Mother 56       vascular   Transient ischemic attack Mother    Dementia Father        after hip fracture   Hypertension Sister        skin cancer   Seizures Neg Hx    Sleep apnea Neg Hx     His Social History Is Significant For: Social History   Socioeconomic History   Marital status: Married    Spouse name: [name]   Number of children: 2   Years of education: 31   Highest education level: Not on file  Occupational History   Occupation: retired    Fish farm manager: UNEMPLOYED  Tobacco Use   Smoking status: Former    Packs/day: 2.00    Years: 13.00    Total pack years: 26.00    Types: Cigarettes    Quit date: 03/05/1968    Years since quitting: 54.4   Smokeless tobacco: Never  Vaping Use   Vaping Use: Never used  Substance and Sexual  Activity   Alcohol use: Yes    Alcohol/week: 2.0 standard drinks of alcohol    Types: 2 Glasses of wine per week    Comment: social   Drug use: No   Sexual activity: Not Currently  Other Topics Concern   Not on file  Social History Narrative   Lives with 4th wife, [name], married 2010   Retired, Physicist, medical   Son, [place], [name], 2 children   Daughter, [place], in [place], 2 children   Patient is right handed   Patient drinks approximately 3-4 cups of caffeine daily   Social Determinants of Radio broadcast assistant Strain: Not on file  Food Insecurity: Not on file  Transportation Needs: Not on file  Physical Activity: Not on file  Stress: Not on file  Social Connections: Not on file    His Allergies Are:  Allergies  Allergen Reactions   Maprotiline Other (See  Comments)    TETRACYCLIC ANTIDEPRESSANTS UNSPECIFIED REACTION (patient is unaware of allergy) TETRACYCLIC ANTIDEPRESSANTS UNSPECIFIED REACTION (patient is unaware of allergy)   Tetracycline Hcl Itching and Rash    Rash on hands  :   His Current Medications Are:  Outpatient Encounter Medications as of 07/30/2022  Medication Sig   acetaminophen (TYLENOL) 650 MG CR tablet 2 tablets as needed   amLODipine (NORVASC) 5 MG tablet TAKE 1 TABLET DAILY   amoxicillin (AMOXIL) 500 MG capsule Take 4 capsules by mouth 1 hour prior to dental appointment (Patient taking differently: as needed.)   apixaban (ELIQUIS) 5 MG TABS tablet TAKE 1 TABLET TWICE A DAY   aspirin 81 MG chewable tablet 1 tab   atorvastatin (LIPITOR) 80 MG tablet 1 tablet   donepezil (ARICEPT) 10 MG tablet Take 1 tablet (10 mg total) by mouth at bedtime.   isosorbide mononitrate (IMDUR) 30 MG 24 hr tablet TAKE 1 TABLET TWICE A DAY (DOSE INCREASE)   labetalol (NORMODYNE) 200 MG tablet Take 100 mg by mouth 2 (two) times daily.   losartan (COZAAR) 50 MG tablet Take 1 tablet by mouth daily.   memantine (NAMENDA) 5 MG tablet Take 1 tablet by mouth daily for 3 weeks, then take 1 tablet by mouth twice a day (Patient taking differently: 10 mg.)   memantine (NAMENDA) 5 MG tablet Take 1 tablet by mouth daily X 3 weeks, then take 1 tablet twice a day thereafter   nitroGLYCERIN (NITROSTAT) 0.4 MG SL tablet PLACE 1 TABLET (0.4 MG TOTAL) UNDER THE TONGUE EVERY 5 (FIVE) MINUTES X 3 DOSES AS NEEDED FOR CHEST PAIN.   ROCKLATAN 0.02-0.005 % SOLN INSTILL 1 DROP INTO THE RIGHT EYE QHS   cephALEXin (KEFLEX) 500 MG capsule Take 1 capsule (500 mg total) by mouth 3 (three) times daily for 7 days   chlorhexidine (PERIDEX) 0.12 % solution Swish and spit 27mls by mouth 2 times daily after brushing teeth for 30 seconds.   donepezil (ARICEPT) 5 MG tablet Take 1 tablet by mouth nightly   loratadine (CLARITIN) 10 MG tablet Take 1 tablet (10 mg total) by mouth daily  for 14 days.   methylPREDNISolone (MEDROL) 4 MG TBPK tablet Take by mouth as directed. Take as directed on pack over 6 days.   Multiple Vitamins-Minerals (PRESERVISION AREDS 2) CAPS Take 1 capsule by mouth 2 (two) times daily.    oxyCODONE (OXY IR/ROXICODONE) 5 MG immediate release tablet Take 1 tablet (5 mg total) by mouth every 4 (four) to 6 (six) hours as needed  for pain   No facility-administered encounter medications on file as of 07/30/2022.  :  Review of Systems:  Out of a complete 14 point review of systems, all are reviewed and negative with the exception of these symptoms as listed below:  Review of Systems  Neurological:        Pt here for CPAP f/u   Pt states no questions or concerns for today's visit Pt states he needs a order for new CPAP supplies     ESS:0    Objective:  Neurological Exam  Physical Exam Physical Examination:   Vitals:   07/30/22 0957  BP: 130/79  Pulse: (!) 58    General Examination: The patient is a very pleasant 86 y.o. male in no acute distress. He appears well-developed and well-nourished and well groomed.   HEENT: Normocephalic, atraumatic, pupils are equal, round and reactive to light, corrective eyeglasses in place.  Extraocular tracking is well preserved. Hearing is grossly intact, bilateral hearing aids in place, status post cataract surgeries. Face is symmetric with normal facial animation and normal facial sensation. Speech is clear with no dysarthria noted. There is no hypophonia. There is no lip, neck/head, jaw or voice tremor. Neck shows FROM. Oropharynx exam reveals: mild mouth dryness, adequate dental hygiene and moderate airway crowding. Tongue protrudes centrally and palate elevates symmetrically. Tonsils are absent.    Chest: Clear to auscultation without wheezing, rhonchi or crackles noted.   Heart: S1+S2+0, regular and normal without murmurs, rubs or gallops noted. Mild bradycardia.     Abdomen: Soft, non-tender and  non-distended.   Extremities: There is puffiness around both ankles.       Skin: Warm and dry without trophic changes noted.   Musculoskeletal: exam reveals right knee brace in place.    Neurologically:  Mental status: The patient is awake, alert and oriented in all 4 spheres. His immediate and remote memory, attention, language skills and fund of knowledge are appropriate. There is no evidence of aphasia, agnosia, apraxia or anomia. Speech is clear with normal prosody and enunciation. Thought process is linear. Mood is normal and affect is normal.  Cranial nerves II - XII are as described above under HEENT exam.  Motor exam: Normal bulk, strength and tone is noted. There is no obvious tremor. Romberg is not tested due to safety concerns.  Fine motor skills and coordination: grossly intact. Cerebellar testing: No dysmetria or intention tremor, no gait ataxia.   Sensory exam: intact to light touch in the upper and lower extremities.  Gait, station and balance: He stands with mild difficulty. Stance is wide based and posture is age-appropriate.  He walks with a 2 wheeled walker.      Assessment and Plan:    In summary, Lemont Sitzmann. is a very pleasant 86 year old male with an underlying medical history of A. fib, overweight state, reflux disease, recurrent TIA, hypertension, MGUS, ulnar neuropathy, memory loss (followed by Dr. Doy Hutching), arthritis, s/p ulnar nerve surgery, s/p joint replacement and back surgeries, who presents for follow-up consultation of his obstructive sleep apnea.  He has been on CPAP for years.  He has been compliant and continues to benefit with treatment. He has had replacement of his machine in the interim.  He is fully compliant with treatment and has been using a nasal interface with success.  He is commended for his treatment adherence and advised to follow-up routinely in 1 year to see one of our nurse practitioners in sleep clinic.  I placed  an order for replacement  supplies, DME is [facility].  I answered all the questions today and the patient and his wife were in agreement with our plan. I spent 30 minutes in total face-to-face time and in reviewing records during pre-charting, more than 50% of which was spent in counseling and coordination of care, reviewing test results, reviewing medications and treatment regimen and/or in discussing or reviewing the diagnosis of OSA, the prognosis and treatment options. Pertinent laboratory and imaging test results that were available during this visit with the patient were reviewed by me and considered in my medical decision making (see chart for details).

## 2022-08-29 ENCOUNTER — Other Ambulatory Visit (HOSPITAL_BASED_OUTPATIENT_CLINIC_OR_DEPARTMENT_OTHER): Payer: Self-pay

## 2022-08-29 MED ORDER — FLUTICASONE PROPIONATE 50 MCG/ACT NA SUSP
2.0000 | Freq: Every day | NASAL | 1 refills | Status: AC
  Filled 2022-08-29: qty 16, 30d supply, fill #0

## 2022-08-29 MED ORDER — AMOXICILLIN-POT CLAVULANATE 875-125 MG PO TABS
1.0000 | ORAL_TABLET | Freq: Two times a day (BID) | ORAL | 0 refills | Status: DC
  Filled 2022-08-29: qty 20, 10d supply, fill #0

## 2022-08-29 MED ORDER — AREXVY 120 MCG/0.5ML IM SUSR
INTRAMUSCULAR | 0 refills | Status: DC
  Filled 2022-08-29 (×2): qty 1, 1d supply, fill #0

## 2022-10-15 ENCOUNTER — Other Ambulatory Visit (HOSPITAL_BASED_OUTPATIENT_CLINIC_OR_DEPARTMENT_OTHER): Payer: Self-pay

## 2022-10-25 ENCOUNTER — Telehealth: Payer: Self-pay | Admitting: Neurology

## 2022-10-25 NOTE — Telephone Encounter (Signed)
[facility] called wanting to know if a prescription has been authorized for the pt to receive his cpap supplies. Please advise.

## 2022-10-25 NOTE — Telephone Encounter (Signed)
Yes this was ordered on 07/30/22. I have sent an urgent message to [facility] to follow-up on this.

## 2022-10-30 NOTE — Telephone Encounter (Signed)
[facility] confirmed receipt of order.  

## 2022-10-31 ENCOUNTER — Other Ambulatory Visit (HOSPITAL_BASED_OUTPATIENT_CLINIC_OR_DEPARTMENT_OTHER): Payer: Self-pay

## 2022-11-30 ENCOUNTER — Other Ambulatory Visit (HOSPITAL_BASED_OUTPATIENT_CLINIC_OR_DEPARTMENT_OTHER): Payer: Self-pay

## 2022-12-03 ENCOUNTER — Encounter: Payer: Self-pay | Admitting: Neurology

## 2022-12-03 ENCOUNTER — Ambulatory Visit (INDEPENDENT_AMBULATORY_CARE_PROVIDER_SITE_OTHER): Payer: Medicare Other | Admitting: Neurology

## 2022-12-03 VITALS — BP 128/77 | HR 62 | Ht 72.0 in | Wt 230.0 lb

## 2022-12-03 DIAGNOSIS — G4733 Obstructive sleep apnea (adult) (pediatric): Secondary | ICD-10-CM | POA: Diagnosis not present

## 2022-12-03 DIAGNOSIS — G6289 Other specified polyneuropathies: Secondary | ICD-10-CM

## 2022-12-03 DIAGNOSIS — R413 Other amnesia: Secondary | ICD-10-CM | POA: Diagnosis not present

## 2022-12-03 DIAGNOSIS — R2681 Unsteadiness on feet: Secondary | ICD-10-CM

## 2022-12-03 NOTE — Progress Notes (Signed)
Subjective:    Patient ID: Jose [name]. is a 87 y.o. male.  HPI    Interim history:   Mr. Bologna is an 87 year old male with an underlying medical history of A. fib, overweight state, reflux disease, recurrent TIA, hypertension, MGUS, ulnar neuropathy, memory loss (followed by Dr. Doy Hutching) and arthritis, who presents for follow-up consultation of his neuropathy. The patient is accompanied by his wife today. I last saw him on 07/30/2022, at which time he was compliant with his CPAP.  He was followed by orthopedics for his knee pain, he reported that his orthopedic specialist told him that a knee replacement was not possible for him  Today, 12/03/2022: He reports ongoing issues with numbness, he has occasional pain in his legs, numbness affects both distal lower extremities from the knees on down.  He has ongoing issues with knee pain both sides and wears knee braces.  He follows with Dr. Mardelle Matte.  He had workup for neuropathy years ago with Dr. Jannifer Franklin who since then retired.  His wife recalls that he was tried on gabapentin but he no longer takes it.  He has not fallen recently, he has a cane, a walker, and wheelchair available.  He recently saw Dr. Doy Hutching in follow-up for his memory loss in January 2024.  He feels stable with regards to his memory loss and tolerates his medications, he is established on 2 medications. He has a follow-up pending in July.  He is doing well with his CPAP and reports full compliance and ongoing good results.  He does not hydrate well with water, wife estimates that he drinks less than a bottle per day.  He drinks a small amount of orange juice in the morning, about 3 cups of coffee per day.   The patient's allergies, current medications, family history, past medical history, past social history, past surgical history and problem list were reviewed and updated as appropriate.    Previously (copied from previous notes for reference):    I saw him on 01/11/2021, at which  time he was compliant with his CPAP machine.  He was tolerating treatment.   I reviewed his CPAP compliance data from 06/26/2022 through 07/25/2022, which is a total of 30 days, during which time he used his machine every night with percent use days greater than 4 hours at 100%, indicating superb compliance with an average usage of 7 hours and 33 minutes, residual AHI at goal at 0.5/h, leak on the higher side with the 95th percentile at 22.4 L/min on a pressure of 12 cm with EPR of 3.     I reviewed his CPAP compliance data from 12/11/2020 through 01/09/2021, which is a total of 30 days, during which time he used his machine every night with percent use days greater than 4 hours at 100%, indicating superb compliance, average usage of 7 hours and 24 minutes, residual AHI at goal at 0.2/h, leak acceptable with a 95th percentile at 16.5 L/min on a pressure of 12 cm but EPR of 3.        I saw him on 12/21/2019, At which time he was compliant with his CPAP and doing well.   He saw Dr. Jannifer Franklin in the interim for his neuropathy on 01/14/2020.   He saw Butler Denmark, NP, on 07/19/2020, at which time he was eligible for new machine.  A new prescription for a machine was written in November 2021.   He received an AutoPap machine with default settings of 4 to  20 cm.  He called in January 2022 reporting that the pressure was too high.  His pressure was adjusted to a set pressure of 12 cm as before.   I saw him on 12/05/2019, at which time he was compliant with his CPAP and doing well.     I reviewed his CPAP compliance data from 11/16/2019 through 12/15/2019 which is a total of 30 days, during which time he used his machine every night with percent use days greater than 4 hours at 100%, indicating superb compliance with an average usage of 7 hours and 40 minutes, residual AHI at goal at 0.2/h, leak on the higher side with a 95th percentile at 21.8 L/min on a pressure of 12 cm with EPR of 3.      I saw him on 12/04/2017,  at which time he was fully compliant with his CPAP. Of note, he had interim left shoulder replacement surgery on 12/25/2016, then had lower back surgery in September 2018, then had right shoulder replacement in January 2019.   I reviewed his CPAP compliance data from 11/03/2018 through 12/02/2018 which is a total of 30 days, during which time he used his CPAP every night with percent used days greater than 4 hours at 100%, indicating superb compliance with an average usage of 7 hours and 26 minutes, residual AHI at goal at 0.3 per hour, leak on the high side with the 95th percentile at 29.8 L/m on a pressure of 12 cm with EPR of 3.      I saw him on 12/04/2016, at which time he reported doing well with CPAP. He was supposed to have left total shoulder replacement in February 2018.   I reviewed his CPAP compliance data from 11/03/2017 through 12/02/2017 which is a total of 30 days, during which time he used his CPAP every night with percent used days greater than 4 hours at 100%, indicating superb compliance with an average usage of 7 hours and 28 minutes, residual AHI at goal at 0.3 per hour, leak on the higher end with the 95th percentile at 17.8 L/m on a pressure of 12 cm with EPR of 3.  I saw him on 12/05/2015, at which time we talked about his split-night sleep study results from 03/01/2015 and his compliance with CPAP. He was fully compliant with CPAP therapy. I suggested with a checkup. He saw Dr. Jannifer Franklin in the interim in March 2017. He had been treated in January 2017 for a community-acquired pneumonia. He then presented to the emergency room in January 2017 a balance issues and was diagnosed with vertigo, placed on meclizine as needed.    I reviewed his CPAP compliance data from 11/03/2016 through 12/02/2016, which is a total of 30 days, during which time he used his CPAP every night with percent used days greater than 4 hours at 100%, indicating superb compliance with an average usage of 7 hours  and 33 minutes, residual AHI 0.3 per hour, leak low with the 95th percentile at 10.2 L/m on a pressure of 12 cm with EPR of 3.    I first met him on 06/01/2015 at the request of Dr. Jannifer Franklin, at which time we talked about his split-night sleep study results from 03/01/2015 as well as his compliance data. He reported doing well. He had adjusted well to treatment. He felt improved with respect to sleep disruption, daytime somnolence, and sleep quality. He had recent ulnar nerve surgery about 3 weeks prior. He was taking gabapentin for neuropathy.  He was seeing a cardiologist out of [place] and was on Eliquis. He goes to bed around 11 PM and his rise time is around 7 AM. He wakes up better rested. His neuropathy seems stable to him. In the interim, he had neck surgery on 02/04/2015 under Dr. Hal Neer. He feels improved. He has a follow-up with his hand surgeon next week. He has not had any recent TIA type symptoms. He drinks alcohol occasionally. He quit smoking in the late 60s. He drinks quite a bit of coffee, at least 2 12 ounce cups a day.   I reviewed his CPAP compliance data from 11/01/2015 through 11/30/2015 which is a total of 30 days during which time he used his machine every night with percent used days greater than 4 hours at 100%, indicating superb compliance with an average usage of 7 hours and 29 minutes, residual AHI low at 0.4 per hour, leak borderline with the 95th percentile at 24.3 L/m on a pressure of 12 cm with EPR of 3.   06/01/2015: He was referred for a sleep study secondary to a report of witnessed apneas during his hospitalization in 1/16, and snoring reported. He had a split-night sleep study on 03/01/2015 and went over his test results with him in detail today. His baseline sleep efficiency was reduced at 77% with a latency to sleep of 5.5 minutes and wake after sleep onset of 29.5 minutes with moderate sleep fragmentation noted. He had a markedly elevated arousal index at 73.3 per  hour, secondary to respiratory events. He had absence of slow-wave sleep and REM sleep prior to CPAP initiation. He had no significant PLMS, EKG or EEG changes. He had moderate snoring. He had a total AHI highly elevated at 71.3 per hour, average oxygen saturation was 93%, nadir was 83% during non-REM sleep. He was then placed on CPAP therapy. Sleep efficiency was 87.5% with a latency to sleep of 4 minutes and wake after sleep onset of 27 minutes with mild to moderate sleep fragmentation noted. He had a tremendous improvement in his arousal index to 3.6 per hour. He had 12.2% of slow-wave sleep and achieved 28.9% of REM sleep. Average oxygen saturation was 93%, nadir was 81%. He had no significant PLMS during the treatment portion of the study. CPAP was titrated from 5 cm to 13 cm with a reduction of his AHI to 2.8 per hour at a pressure of 12 cm. Based on his test results I prescribed CPAP therapy for home use.    I reviewed his CPAP compliance data from 05/01/2015 through 05/30/2015 which is a total of 30 days during which time he used his machine every night with percent used days greater than 4 hours at 93%, indicating excellent compliance with an average usage of 6 hours and 32 minutes, residual AHI at 3.4 per hour, leak acceptable with the 95th percentile at 20.2 L/m on a pressure of 12 cm with EPR of 3.    His Past Medical History Is Significant For: Past Medical History:  Diagnosis Date   Arthritis    CAD (coronary artery disease)    04/15/18 65% dLAD, 20% pLAD, 30% 2nd diag, Normal EF   Dysrhythmia    afib   Gallstones 07/1996   Headache    couple times a year   History of CT scan of head 1997   small vessel disease   History of ETT 05/2002    no EKG changes   History of MRI of cervical spine  07/15/2002   Hypertension    Lumbar spondylolysis 12/17/2016   MGUS (monoclonal gammopathy of unknown significance) 04/11/2012   Occipital neuralgia of left side 12/09/2018   Peripheral neuropathy     Pneumonia 10/2014   PONV (postoperative nausea and vomiting)    once many years ago   Primary localized osteoarthritis of left hip 07/08/2018   Primary localized osteoarthrosis of left shoulder 12/25/2016   Primary localized osteoarthrosis of right shoulder 11/05/2017   Rectal bleeding 12/2002   colonoscopy   Seizures ([place])    last seizure 1976   Skin cancer    Sleep apnea    uses CPAP   Spinal stenosis, lumbar region with neurogenic claudication    Stroke [facility])    ?tia ?afib   TIA (transient ischemic attack)    Ulnar neuropathy at elbow 12/24/2014   Left   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 extremity 03/29/2015    His Past Surgical History Is Significant For: Past Surgical History:  Procedure Laterality Date   ANTERIOR CERVICAL DECOMP/DISCECTOMY FUSION N/A 02/04/2015   Procedure: ANTERIOR CERVICAL DECOMPRESSION/DISCECTOMY FUSION CERVICAL FIVE-SIX,CERVICAL SIX-SEVEN;  Surgeon: Karie Chimera, MD;  Location: [place] [facility];  Service: Neurosurgery;  Laterality: N/A;   arthroscopic knee surgery     BACK SURGERY     CATARACT EXTRACTION Bilateral    COLONOSCOPY W/ POLYPECTOMY  04/19/2003   tubular adenoma   EYE SURGERY     Bilateral Cataracts   GREAT TOE ARTHRODESIS, INTERPHALANGEAL JOINT Left 1978   JOINT REPLACEMENT     joint replacement on right thumb Right    LEFT HEART CATH AND CORONARY ANGIOGRAPHY N/A 04/15/2018   Procedure: LEFT HEART CATH AND CORONARY ANGIOGRAPHY;  Surgeon: Troy Sine, MD;  Location: [place] CV LAB;  Service: Cardiovascular;  Laterality: N/A;   LUMBAR LAMINECTOMY/DECOMPRESSION MICRODISCECTOMY N/A 07/19/2017   Procedure: LAMINECTOMY AND FORAMINOTOMY  LUMBAR TWO- LUMBAR THREE, LUMBAR THREE- LUMBAR FOUR;  Surgeon: Ashok Pall, MD;  Location: [place];  Service: Neurosurgery;  Laterality: N/A;   nerve transfer surgery to lwft hand Left    Nov. 2017   SHOULDER ARTHROSCOPY W/ ROTATOR CUFF REPAIR Left 2013   rotator cuff    SHOULDER INJECTION  03/2011    left, [facility]   SKIN CANCER EXCISION  07/1993   left lateral arm   SUPERFICIAL KERATECTOMY Right 05/1998   wrist   thumb injection  03/2011   left, [place]   TOE SURGERY Right    TONSILLECTOMY AND ADENOIDECTOMY     TOTAL HIP ARTHROPLASTY Left 07/08/2018   TOTAL HIP ARTHROPLASTY Left 07/08/2018   Procedure: LEFT TOTAL HIP ARTHROPLASTY;  Surgeon: Marchia Bond, MD;  Location: [place];  Service: Orthopedics;  Laterality: Left;   TOTAL SHOULDER ARTHROPLASTY Left 12/25/2016   Procedure: TOTAL SHOULDER ARTHROPLASTY;  Surgeon: Marchia Bond, MD;  Location: [place];  Service: Orthopedics;  Laterality: Left;   TOTAL SHOULDER ARTHROPLASTY Right 11/05/2017   TOTAL SHOULDER ARTHROPLASTY Right 11/05/2017   Procedure: TOTAL SHOULDER ARTHROPLASTY;  Surgeon: Marchia Bond, MD;  Location: [place];  Service: Orthopedics;  Laterality: Right;   ulnar nerve transfer  12/1999   left   ULNAR NERVE TRANSPOSITION Left     His Family History Is Significant For: Family History  Problem Relation Age of Onset   Dementia Mother 5       vascular   Transient ischemic attack Mother    Dementia Father        after hip fracture   Hypertension Sister  skin cancer   Seizures Neg Hx    Sleep apnea Neg Hx    Neuropathy Neg Hx     His Social History Is Significant For: Social History   Socioeconomic History   Marital status: Married    Spouse name: [name]   Number of children: 2   Years of education: 19   Highest education level: Not on file  Occupational History   Occupation: retired    Fish farm manager: UNEMPLOYED  Tobacco Use   Smoking status: Former    Packs/day: 2.00    Years: 13.00    Total pack years: 26.00    Types: Cigarettes    Quit date: 03/05/1968    Years since quitting: 54.7   Smokeless tobacco: Never  Vaping Use   Vaping Use: Never used  Substance and Sexual Activity   Alcohol use: Yes    Alcohol/week: 2.0 standard drinks of alcohol    Types: 2 Glasses of wine per week     Comment: social   Drug use: No   Sexual activity: Not Currently  Other Topics Concern   Not on file  Social History Narrative   Lives with 4th wife, [name], married 2010   Retired, Physicist, medical   Son, [place], [name], 2 children   Daughter, [place], in [place], 2 children   Patient is right handed   Patient drinks approximately 3-4 cups of caffeine daily   Social Determinants of Radio broadcast assistant Strain: Not on file  Food Insecurity: Not on file  Transportation Needs: Not on file  Physical Activity: Not on file  Stress: Not on file  Social Connections: Not on file    His Allergies Are:  Allergies  Allergen Reactions   Maprotiline Other (See Comments)    TETRACYCLIC ANTIDEPRESSANTS UNSPECIFIED REACTION (patient is unaware of allergy) TETRACYCLIC ANTIDEPRESSANTS UNSPECIFIED REACTION (patient is unaware of allergy)   Tetracycline Hcl Itching and Rash    Rash on hands  :   His Current Medications Are:  Outpatient Encounter Medications as of 12/03/2022  Medication Sig   acetaminophen (TYLENOL) 650 MG CR tablet 2 tablets as needed   amLODipine (NORVASC) 5 MG tablet TAKE 1 TABLET DAILY   apixaban (ELIQUIS) 5 MG TABS tablet TAKE 1 TABLET TWICE A DAY   aspirin 81 MG chewable tablet 1 tab   atorvastatin (LIPITOR) 80 MG tablet 1 tablet   donepezil (ARICEPT) 10 MG tablet Take 1 tablet (10 mg total) by mouth at bedtime.   fluticasone (FLONASE) 50 MCG/ACT nasal spray Place 2 sprays into both nostrils daily.   isosorbide mononitrate (IMDUR) 30 MG 24 hr tablet TAKE 1 TABLET TWICE A DAY (DOSE INCREASE)   labetalol (NORMODYNE) 200 MG tablet Take 100 mg by mouth 2 (two) times daily.   loratadine (CLARITIN) 10 MG tablet Take 1 tablet (10 mg total) by mouth daily for 14 days.   losartan (COZAAR) 50 MG tablet Take 1 tablet by mouth daily.   memantine (NAMENDA) 5 MG tablet Take 1 tablet by mouth daily X 3 weeks, then take 1 tablet twice a  day thereafter (Patient taking differently: 5 mg 2 (two) times daily. Take 1 tablet by mouth daily X 3 weeks, then take 1 tablet twice a day thereafter)   Multiple Vitamins-Minerals (PRESERVISION AREDS 2) CAPS Take 1 capsule by mouth 2 (two) times daily.    nitroGLYCERIN (NITROSTAT) 0.4 MG SL tablet PLACE 1 TABLET (0.4 MG TOTAL) UNDER THE TONGUE EVERY  5 (FIVE) MINUTES X 3 DOSES AS NEEDED FOR CHEST PAIN.   ROCKLATAN 0.02-0.005 % SOLN INSTILL 1 DROP INTO THE RIGHT EYE QHS   amoxicillin (AMOXIL) 500 MG capsule Take 4 capsules by mouth 1 hour prior to dental appointment (Patient not taking: Reported on 12/03/2022)   amoxicillin-clavulanate (AUGMENTIN) 875-125 MG tablet Take 1 tablet by mouth 2 (two) times daily for 10 days (Patient not taking: Reported on 12/03/2022)   cephALEXin (KEFLEX) 500 MG capsule Take 1 capsule (500 mg total) by mouth 3 (three) times daily for 7 days   chlorhexidine (PERIDEX) 0.12 % solution Swish and spit 22ms by mouth 2 times daily after brushing teeth for 30 seconds. (Patient not taking: Reported on 12/03/2022)   donepezil (ARICEPT) 5 MG tablet Take 1 tablet by mouth nightly   memantine (NAMENDA) 5 MG tablet Take 1 tablet by mouth daily for 3 weeks, then take 1 tablet by mouth twice a day (Patient taking differently: 10 mg.)   methylPREDNISolone (MEDROL) 4 MG TBPK tablet Take by mouth as directed. Take as directed on pack over 6 days.   oxyCODONE (OXY IR/ROXICODONE) 5 MG immediate release tablet Take 1 tablet (5 mg total) by mouth every 4 (four) to 6 (six) hours as needed for pain   RSV vaccine recomb adjuvanted (AREXVY) 120 MCG/0.5ML injection Inject into the muscle.   No facility-administered encounter medications on file as of 12/03/2022.  :  Review of Systems:  Out of a complete 14 point review of systems, all are reviewed and negative with the exception of these symptoms as listed below:       Review of Systems  Neurological:        Pt here Neuropathy pain Pt states  Neuropathy pain in both legs . Pt states pain is from knees to toes . Pt states burning,tingling,and numbness     Objective:  Neurological Exam  Physical Exam Physical Examination:   Vitals:   12/03/22 1456  BP: 128/77  Pulse: 62    General Examination: The patient is a very pleasant 87y.o. male in no acute distress. He appears well-developed and well-nourished and well groomed.   HEENT: Normocephalic, atraumatic, pupils are equal, round and reactive to light, corrective eyeglasses in place.  Extraocular tracking is well preserved. Hearing is grossly intact, with bilateral hearing aids in place. He is status post cataract surgeries. Face is symmetric with normal facial animation and normal facial sensation. Speech is clear with no dysarthria noted. There is no hypophonia. There is no lip, neck/head, jaw or voice tremor. Neck shows FROM. Oropharynx exam reveals: mild mouth dryness, adequate dental hygiene and moderate airway crowding. Tongue protrudes centrally and palate elevates symmetrically. Tonsils are absent.    Chest: Clear to auscultation without wheezing, rhonchi or crackles noted.   Heart: S1+S2+0, regular and normal without murmurs, rubs or gallops noted. Mild bradycardia.     Abdomen: Soft, non-tender and non-distended.   Extremities: There is puffiness around both ankles.       Skin: Warm and dry without trophic changes noted.   Musculoskeletal: exam reveals b/l knee braces in place.    Neurologically:  Mental status: The patient is awake, alert and able to provide some of his history, history supplemented by his wife.   Speech is clear with normal prosody and enunciation. Thought process is linear. Mood is normal and affect is normal.  Cranial nerves II - XII are as described above under HEENT exam.  Motor exam: Normal bulk, strength and  tone is noted. There is no obvious tremor. Romberg is not tested due to safety concerns.  Fine motor skills and coordination:  grossly intact. Cerebellar testing: No dysmetria or intention tremor, no gait ataxia.   Sensory exam: intact to light touch in the upper and lower extremities.  Reflexes are 1+ in the upper extremities and absent in the lower extremities.  Of note, I did not remove his knee braces.  Gait, station and balance: He stands with mild difficulty. Stance is wide based and posture is age-appropriate.  He did not bring his walker today and walks unsteadily, slightly wider base, shorter steps, has a tendency to look down while walking.  He turns in 3 steps.       Assessment and Plan:    In summary, Tymeer Vaquera is an 87 year old male with an underlying medical history of A. fib, overweight state, reflux disease, recurrent TIA, hypertension, MGUS, ulnar neuropathy, memory loss (followed by Dr. Doy Hutching), arthritis, s/p ulnar nerve surgery, s/p joint replacement and back surgeries, and peripheral neuropathy, who presents for follow-up consultation of his neuropathy of many years duration.  He is currently not on any medication to help with neuropathy pain as his symptoms are primarily numbness.  He is advised that there is no medication unfortunately that would treat numbness.  He had tried gabapentin in the past but no longer takes it.  As far as his memory, he is well-established with Dr. Doy Hutching and takes both Namenda and Aricept in generic form, tolerates both medications and has a follow-up pending in July.  He is encouraged to keep that follow-up appointment, his last visit was just in January 2024.  He is advised to use his walker at all times, he is unstable while walking, he is wearing knee braces and is followed by orthopedics.  We talked about the importance of maintaining a healthy lifestyle and fall prevention, he does not drink much in the way of water, wife estimates that he drinks less than 1 bottle per day.  He is advised to drink more water as it also helps with balance and cognitive symptoms.  He  is advised to continue to use his CPAP consistently and follow-up as scheduled in sleep clinic to see Debbora Presto, NP in October 2024.  He is advised to talk to his primary care about seeing hematology again, he was found to have MGUS some years ago and has not seen an oncologist/hematologist in some years as I understand.  He may need a referral and is encouraged to talk to PCP about it. I answered all the questions today and the patient and his wife were in agreement with our plan. I spent 40 minutes in total face-to-face time and in reviewing records during pre-charting, more than 50% of which was spent in counseling and coordination of care, reviewing test results, reviewing medications and treatment regimen and/or in discussing or reviewing the diagnosis of neuropathy, memory loss, gait disorder, the prognosis and treatment options. Pertinent laboratory and imaging test results that were available during this visit with the patient were reviewed by me and considered in my medical decision making (see chart for details).

## 2022-12-03 NOTE — Patient Instructions (Addendum)
It was nice to see you again today.  For your numbness, unfortunately, there is no medication that would alleviate numbness and since you do not have significant pain at this time, I would not recommend any new medication for your neuropathy.  Please talk to your primary care about seeing an oncologist/hematologist again as you had previously been found to have abnormal protein in your blood, called monoclonal gammopathy.  You may need a referral to be seen by an oncologist/hematologist again.  For your memory, you are well-established with Dr. Macario Carls since last year.  You are stable on memory medication, I would not recommend any changes and encourage you to follow-up with her as scheduled.  You can also talk to primary care about maintaining your memory medication prescription through her if need be.  Please keep your follow-up appointment in our sleep clinic with Amy as scheduled in October.  I would recommend you use your walker consistently as you are unstable while walking, mostly because of knee pain and unstable knees.  Please keep your follow-up appointment with your orthopedic specialist as scheduled.

## 2022-12-10 ENCOUNTER — Other Ambulatory Visit (HOSPITAL_BASED_OUTPATIENT_CLINIC_OR_DEPARTMENT_OTHER): Payer: Self-pay

## 2022-12-10 MED ORDER — METHYLPREDNISOLONE 4 MG PO TBPK
ORAL_TABLET | ORAL | 0 refills | Status: DC
  Filled 2022-12-10: qty 21, 6d supply, fill #0

## 2022-12-14 ENCOUNTER — Other Ambulatory Visit (HOSPITAL_BASED_OUTPATIENT_CLINIC_OR_DEPARTMENT_OTHER): Payer: Self-pay

## 2022-12-14 ENCOUNTER — Other Ambulatory Visit (HOSPITAL_COMMUNITY): Payer: Self-pay

## 2022-12-14 MED ORDER — BROMFENAC SODIUM 0.07 % OP SOLN
OPHTHALMIC | 0 refills | Status: DC
  Filled 2022-12-14: qty 3, 20d supply, fill #0
  Filled 2022-12-14: qty 3, 30d supply, fill #0
  Filled 2022-12-14: qty 3, 20d supply, fill #0
  Filled 2022-12-14 (×2): qty 3, 30d supply, fill #0

## 2022-12-17 ENCOUNTER — Other Ambulatory Visit (HOSPITAL_BASED_OUTPATIENT_CLINIC_OR_DEPARTMENT_OTHER): Payer: Self-pay

## 2022-12-18 ENCOUNTER — Other Ambulatory Visit (HOSPITAL_BASED_OUTPATIENT_CLINIC_OR_DEPARTMENT_OTHER): Payer: Self-pay

## 2022-12-18 ENCOUNTER — Other Ambulatory Visit: Payer: Self-pay

## 2023-01-01 ENCOUNTER — Observation Stay (HOSPITAL_COMMUNITY): Payer: Medicare Other

## 2023-01-01 ENCOUNTER — Encounter (HOSPITAL_COMMUNITY): Payer: Self-pay

## 2023-01-01 ENCOUNTER — Emergency Department (HOSPITAL_COMMUNITY): Payer: Medicare Other

## 2023-01-01 ENCOUNTER — Observation Stay (HOSPITAL_COMMUNITY)
Admission: EM | Admit: 2023-01-01 | Discharge: 2023-01-02 | Disposition: A | Discharge status: Home / Self Care | Payer: Medicare Other | Attending: Internal Medicine | Admitting: Internal Medicine

## 2023-01-01 ENCOUNTER — Other Ambulatory Visit: Payer: Self-pay

## 2023-01-01 DIAGNOSIS — R29818 Other symptoms and signs involving the nervous system: Secondary | ICD-10-CM | POA: Diagnosis present

## 2023-01-01 DIAGNOSIS — I951 Orthostatic hypotension: Principal | ICD-10-CM | POA: Insufficient documentation

## 2023-01-01 DIAGNOSIS — F0390 Unspecified dementia without behavioral disturbance: Secondary | ICD-10-CM | POA: Diagnosis not present

## 2023-01-01 DIAGNOSIS — I2510 Atherosclerotic heart disease of native coronary artery without angina pectoris: Secondary | ICD-10-CM | POA: Insufficient documentation

## 2023-01-01 DIAGNOSIS — Z85828 Personal history of other malignant neoplasm of skin: Secondary | ICD-10-CM | POA: Insufficient documentation

## 2023-01-01 DIAGNOSIS — Z79899 Other long term (current) drug therapy: Secondary | ICD-10-CM | POA: Insufficient documentation

## 2023-01-01 DIAGNOSIS — G459 Transient cerebral ischemic attack, unspecified: Secondary | ICD-10-CM | POA: Diagnosis not present

## 2023-01-01 DIAGNOSIS — Z87891 Personal history of nicotine dependence: Secondary | ICD-10-CM | POA: Diagnosis not present

## 2023-01-01 DIAGNOSIS — J479 Bronchiectasis, uncomplicated: Secondary | ICD-10-CM

## 2023-01-01 DIAGNOSIS — I480 Paroxysmal atrial fibrillation: Secondary | ICD-10-CM | POA: Diagnosis not present

## 2023-01-01 DIAGNOSIS — E785 Hyperlipidemia, unspecified: Secondary | ICD-10-CM | POA: Diagnosis not present

## 2023-01-01 DIAGNOSIS — Z96612 Presence of left artificial shoulder joint: Secondary | ICD-10-CM | POA: Insufficient documentation

## 2023-01-01 DIAGNOSIS — Z7901 Long term (current) use of anticoagulants: Secondary | ICD-10-CM | POA: Diagnosis not present

## 2023-01-01 DIAGNOSIS — Z7982 Long term (current) use of aspirin: Secondary | ICD-10-CM | POA: Diagnosis not present

## 2023-01-01 DIAGNOSIS — I10 Essential (primary) hypertension: Secondary | ICD-10-CM | POA: Diagnosis present

## 2023-01-01 DIAGNOSIS — Z8673 Personal history of transient ischemic attack (TIA), and cerebral infarction without residual deficits: Secondary | ICD-10-CM | POA: Diagnosis not present

## 2023-01-01 DIAGNOSIS — Z96642 Presence of left artificial hip joint: Secondary | ICD-10-CM | POA: Diagnosis not present

## 2023-01-01 DIAGNOSIS — N400 Enlarged prostate without lower urinary tract symptoms: Secondary | ICD-10-CM | POA: Diagnosis present

## 2023-01-01 DIAGNOSIS — M109 Gout, unspecified: Secondary | ICD-10-CM | POA: Diagnosis present

## 2023-01-01 DIAGNOSIS — I25119 Atherosclerotic heart disease of native coronary artery with unspecified angina pectoris: Secondary | ICD-10-CM | POA: Diagnosis present

## 2023-01-01 DIAGNOSIS — Z96611 Presence of right artificial shoulder joint: Secondary | ICD-10-CM | POA: Diagnosis not present

## 2023-01-01 LAB — ETHANOL: Alcohol, Ethyl (B): <10 mg/dL (ref <10)

## 2023-01-01 LAB — CBC
HCT: 38 % — ABNORMAL LOW (ref 39.0–52.0)
Hemoglobin: 12.4 g/dL — ABNORMAL LOW (ref 13.0–17.0)
MCH: 33.7 pg (ref 26.0–34.0)
MCHC: 32.6 g/dL (ref 30.0–36.0)
MCV: 103.3 fL — ABNORMAL HIGH (ref 80.0–100.0)
Platelets: 134 10*3/uL — ABNORMAL LOW (ref 150–400)
RBC: 3.68 MIL/uL — ABNORMAL LOW (ref 4.22–5.81)
RDW: 13.3 % (ref 11.5–15.5)
WBC: 5.3 10*3/uL (ref 4.0–10.5)
nRBC: 0 % (ref 0.0–0.2)

## 2023-01-01 LAB — COMPREHENSIVE METABOLIC PANEL
ALT: 33 U/L (ref 0–44)
AST: 31 U/L (ref 15–41)
Albumin: 3.7 g/dL (ref 3.5–5.0)
Alkaline Phosphatase: 46 U/L (ref 38–126)
Anion gap: 12 (ref 5–15)
BUN: 19 mg/dL (ref 8–23)
CO2: 21 mmol/L — ABNORMAL LOW (ref 22–32)
Calcium: 8.7 mg/dL — ABNORMAL LOW (ref 8.9–10.3)
Chloride: 107 mmol/L (ref 98–111)
Creatinine, Ser: 0.82 mg/dL (ref 0.61–1.24)
GFR, Estimated: >60 mL/min (ref >60)
Glucose, Bld: 129 mg/dL — ABNORMAL HIGH (ref 70–99)
Potassium: 3.5 mmol/L (ref 3.5–5.1)
Sodium: 140 mmol/L (ref 135–145)
Total Bilirubin: 0.7 mg/dL (ref 0.3–1.2)
Total Protein: 5.8 g/dL — ABNORMAL LOW (ref 6.5–8.1)

## 2023-01-01 LAB — DIFFERENTIAL
Abs Immature Granulocytes: 0.02 10*3/uL (ref 0.00–0.07)
Basophils Absolute: 0 10*3/uL (ref 0.0–0.1)
Basophils Relative: 1 %
Eosinophils Absolute: 0.1 10*3/uL (ref 0.0–0.5)
Eosinophils Relative: 2 %
Immature Granulocytes: 0 %
Lymphocytes Relative: 27 %
Lymphs Abs: 1.4 10*3/uL (ref 0.7–4.0)
Monocytes Absolute: 0.4 10*3/uL (ref 0.1–1.0)
Monocytes Relative: 7 %
Neutro Abs: 3.3 10*3/uL (ref 1.7–7.7)
Neutrophils Relative %: 63 %

## 2023-01-01 LAB — PROTIME-INR
INR: 1.4 — ABNORMAL HIGH (ref 0.8–1.2)
Prothrombin Time: 16.9 seconds — ABNORMAL HIGH (ref 11.4–15.2)

## 2023-01-01 LAB — I-STAT CHEM 8, ED
BUN: 22 mg/dL (ref 8–23)
Calcium, Ion: 1.1 mmol/L — ABNORMAL LOW (ref 1.15–1.40)
Chloride: 104 mmol/L (ref 98–111)
Creatinine, Ser: 0.7 mg/dL (ref 0.61–1.24)
Glucose, Bld: 125 mg/dL — ABNORMAL HIGH (ref 70–99)
HCT: 36 % — ABNORMAL LOW (ref 39.0–52.0)
Hemoglobin: 12.2 g/dL — ABNORMAL LOW (ref 13.0–17.0)
Potassium: 3.6 mmol/L (ref 3.5–5.1)
Sodium: 142 mmol/L (ref 135–145)
TCO2: 28 mmol/L (ref 22–32)

## 2023-01-01 LAB — APTT: aPTT: 29 seconds (ref 24–36)

## 2023-01-01 LAB — CBG MONITORING, ED: Glucose-Capillary: 126 mg/dL — ABNORMAL HIGH (ref 70–99)

## 2023-01-01 MED ORDER — IOHEXOL 350 MG/ML SOLN
75.0000 mL | Freq: Once | INTRAVENOUS | Status: AC | PRN
  Administered 2023-01-01: 75 mL via INTRAVENOUS

## 2023-01-01 MED ORDER — MEMANTINE HCL 10 MG PO TABS
5.0000 mg | ORAL_TABLET | Freq: Two times a day (BID) | ORAL | Status: DC
  Administered 2023-01-01 – 2023-01-02 (×2): 5 mg via ORAL
  Filled 2023-01-01 (×2): qty 1

## 2023-01-01 MED ORDER — DONEPEZIL HCL 10 MG PO TABS
10.0000 mg | ORAL_TABLET | Freq: Every day | ORAL | Status: DC
  Administered 2023-01-01: 10 mg via ORAL
  Filled 2023-01-01: qty 1

## 2023-01-01 MED ORDER — STROKE: EARLY STAGES OF RECOVERY BOOK
Freq: Once | Status: AC
  Filled 2023-01-01: qty 1

## 2023-01-01 MED ORDER — APIXABAN 5 MG PO TABS
5.0000 mg | ORAL_TABLET | Freq: Two times a day (BID) | ORAL | Status: DC
  Administered 2023-01-01 – 2023-01-02 (×2): 5 mg via ORAL
  Filled 2023-01-01 (×2): qty 1

## 2023-01-01 MED ORDER — KETOROLAC TROMETHAMINE 0.5 % OP SOLN
1.0000 [drp] | Freq: Three times a day (TID) | OPHTHALMIC | Status: DC
  Administered 2023-01-02: 1 [drp] via OPHTHALMIC
  Filled 2023-01-01: qty 5

## 2023-01-01 MED ORDER — ASPIRIN 81 MG PO CHEW
81.0000 mg | CHEWABLE_TABLET | Freq: Every day | ORAL | Status: DC
  Administered 2023-01-02: 81 mg via ORAL
  Filled 2023-01-01: qty 1

## 2023-01-01 MED ORDER — ACETAMINOPHEN 325 MG PO TABS: 650.0000 mg | ORAL_TABLET | ORAL | Status: DC | PRN

## 2023-01-01 MED ORDER — NETARSUDIL-LATANOPROST 0.02-0.005 % OP SOLN: 1.0000 [drp] | Freq: Every day | OPHTHALMIC | Status: DC

## 2023-01-01 MED ORDER — POLYETHYL GLYCOL-PROPYL GLYCOL 0.4-0.3 % OP GEL: Freq: Four times a day (QID) | OPHTHALMIC | Status: DC | PRN

## 2023-01-01 MED ORDER — SENNOSIDES-DOCUSATE SODIUM 8.6-50 MG PO TABS: 1.0000 | ORAL_TABLET | Freq: Every evening | ORAL | Status: DC | PRN

## 2023-01-01 MED ORDER — PROSIGHT PO TABS
1.0000 | ORAL_TABLET | Freq: Two times a day (BID) | ORAL | Status: DC
  Administered 2023-01-02: 1 via ORAL
  Filled 2023-01-01 (×3): qty 1

## 2023-01-01 MED ORDER — ACETAMINOPHEN 160 MG/5ML PO SOLN: 650.0000 mg | ORAL | Status: DC | PRN

## 2023-01-01 MED ORDER — ACETAMINOPHEN 650 MG RE SUPP: 650.0000 mg | RECTAL | Status: DC | PRN

## 2023-01-01 MED ORDER — SODIUM CHLORIDE 0.9% FLUSH: 3.0000 mL | Freq: Once | INTRAVENOUS | Status: DC

## 2023-01-01 NOTE — ED Provider Notes (Signed)
[place] Provider Note   CSN: [id number] Arrival date & time: 01/01/23  1230  An emergency department physician performed an initial assessment on this suspected stroke patient at 69.  History  Chief Complaint  Patient presents with   Code Stroke    Jose Venne. is a 87 y.o. male.  87 year old male with prior medical history as detailed below presents for evaluation.  Patient was called as a code stroke by EMS.  At approximately 11:45 AM patient noted unsteadiness to his gait.  Jose [name] reported left-sided weakness.  On arrival patient seen by code stroke neuro team.  Patient symptoms have improved significantly on arrival to ED.  Patient deemed by neuro team to most likely have a TIA.  No indication for acute neuro intervention.    The history is provided by the patient and medical records.       Home Medications Prior to Admission medications   Medication Sig Start Date End Date Taking? Authorizing Provider  acetaminophen (TYLENOL) 650 MG CR tablet 2 tablets as needed    [provider]  amLODipine (NORVASC) 5 MG tablet TAKE 1 TABLET DAILY 06/08/19   [provider]  amoxicillin (AMOXIL) 500 MG capsule Take 4 capsules by mouth 1 hour prior to dental appointment Patient not taking: Reported on 12/03/2022 06/20/22     amoxicillin-clavulanate (AUGMENTIN) 875-125 MG tablet Take 1 tablet by mouth 2 (two) times daily for 10 days Patient not taking: Reported on 12/03/2022 08/29/22     apixaban (ELIQUIS) 5 MG TABS tablet TAKE 1 TABLET TWICE A DAY 12/20/21   Burnell Blanks, MD  aspirin 81 MG chewable tablet 1 tab    [provider]  atorvastatin (LIPITOR) 80 MG tablet 1 tablet    [provider]  Bromfenac Sodium (PROLENSA) 0.07 % SOLN Administer 1 drop into left eye three times daily. 12/14/22     cephALEXin (KEFLEX) 500 MG capsule Take 1 capsule (500 mg total) by mouth 3 (three) times daily  for 7 days 07/09/22     chlorhexidine (PERIDEX) 0.12 % solution Swish and spit 81ms by mouth 2 times daily after brushing teeth for 30 seconds. Patient not taking: Reported on 12/03/2022 04/16/22     donepezil (ARICEPT) 10 MG tablet Take 1 tablet (10 mg total) by mouth at bedtime. 06/21/22     donepezil (ARICEPT) 5 MG tablet Take 1 tablet by mouth nightly 04/16/22     fluticasone (FLONASE) 50 MCG/ACT nasal spray Place 2 sprays into both nostrils daily. 08/29/22     isosorbide mononitrate (IMDUR) 30 MG 24 hr tablet TAKE 1 TABLET TWICE A DAY (DOSE INCREASE) 11/03/21   MBurnell Blanks MD  labetalol (NORMODYNE) 200 MG tablet Take 100 mg by mouth 2 (two) times daily.    [provider]  loratadine (CLARITIN) 10 MG tablet Take 1 tablet (10 mg total) by mouth daily for 14 days. 06/02/21     losartan (COZAAR) 50 MG tablet Take 1 tablet by mouth daily. 02/25/20   [provider]  memantine (NAMENDA) 5 MG tablet Take 1 tablet by mouth daily for 3 weeks, then take 1 tablet by mouth twice a day Patient taking differently: 10 mg. 06/21/22     memantine (NAMENDA) 5 MG tablet Take 1 tablet by mouth daily X 3 weeks, then take 1 tablet twice a day thereafter Patient taking differently: 5 mg 2 (two) times daily. Take 1 tablet by mouth daily X  3 weeks, then take 1 tablet twice a day thereafter 07/23/22     methylPREDNISolone (MEDROL) 4 MG TBPK tablet Take by mouth as directed. Take as directed on pack over 6 days. 02/16/22     methylPREDNISolone (MEDROL) 4 MG TBPK tablet Take as directed on pack for 6 days. 12/10/22     Multiple Vitamins-Minerals (PRESERVISION AREDS 2) CAPS Take 1 capsule by mouth 2 (two) times daily.     [provider]  nitroGLYCERIN (NITROSTAT) 0.4 MG SL tablet PLACE 1 TABLET (0.4 MG TOTAL) UNDER THE TONGUE EVERY 5 (FIVE) MINUTES X 3 DOSES AS NEEDED FOR CHEST PAIN. 03/03/21   Burnell Blanks, MD  oxyCODONE (OXY IR/ROXICODONE) 5 MG immediate release tablet Take 1 tablet  (5 mg total) by mouth every 4 (four) to 6 (six) hours as needed for pain 05/29/22     ROCKLATAN 0.02-0.005 % SOLN INSTILL 1 DROP INTO THE RIGHT EYE QHS 04/10/19   [provider]  RSV vaccine recomb adjuvanted (AREXVY) 120 MCG/0.5ML injection Inject into the muscle. 08/29/22   Carlyle Basques, MD      Allergies    Maprotiline and Tetracycline hcl    Review of Systems   Review of Systems  All other systems reviewed and are negative.   Physical Exam Updated Vital Signs BP 133/86 (BP Location: Left Arm)   Pulse (!) 58   Temp 97.6 F (36.4 C) (Oral)   Resp 18   Wt 105.4 kg   SpO2 96%   BMI 31.51 kg/m  Physical Exam Vitals and nursing note reviewed.  Constitutional:      General: Jose [name] is not in acute distress.    Appearance: Normal appearance. Jose [name] is well-developed.  HENT:     Head: Normocephalic and atraumatic.  Eyes:     Conjunctiva/sclera: Conjunctivae normal.     Pupils: Pupils are equal, round, and reactive to light.  Cardiovascular:     Rate and Rhythm: Normal rate and regular rhythm.     Heart sounds: Normal heart sounds.  Pulmonary:     Effort: Pulmonary effort is normal. No respiratory distress.     Breath sounds: Normal breath sounds.  Abdominal:     General: There is no distension.     Palpations: Abdomen is soft.     Tenderness: There is no abdominal tenderness.  Musculoskeletal:        General: No deformity. Normal range of motion.     Cervical back: Normal range of motion and neck supple.  Skin:    General: Skin is warm and dry.  Neurological:     General: No focal deficit present.     Mental Status: Jose [name] is alert and oriented to person, place, and time. Mental status is at baseline.     ED Results / Procedures / Treatments   Labs (all labs ordered are listed, but only abnormal results are displayed) Labs Reviewed  PROTIME-INR - Abnormal; Notable for the following components:      Result Value   Prothrombin Time 16.9 (*)    INR 1.4 (*)    All  other components within normal limits  CBC - Abnormal; Notable for the following components:   RBC 3.68 (*)    Hemoglobin 12.4 (*)    HCT 38.0 (*)    MCV 103.3 (*)    Platelets 134 (*)    All other components within normal limits  COMPREHENSIVE METABOLIC PANEL - Abnormal; Notable for the following components:   CO2 21 (*)  Glucose, Bld 129 (*)    Calcium 8.7 (*)    Total Protein 5.8 (*)    All other components within normal limits  I-STAT CHEM 8, ED - Abnormal; Notable for the following components:   Glucose, Bld 125 (*)    Calcium, Ion 1.10 (*)    Hemoglobin 12.2 (*)    HCT 36.0 (*)    All other components within normal limits  CBG MONITORING, ED - Abnormal; Notable for the following components:   Glucose-Capillary 126 (*)    All other components within normal limits  APTT  DIFFERENTIAL  ETHANOL    EKG EKG Interpretation  Date/Time:  Tuesday January 01 2023 13:06:02 EST Ventricular Rate:  54 PR Interval:  215 QRS Duration: 105 QT Interval:  488 QTC Calculation: 463 R Axis:   -39 Text Interpretation: Sinus rhythm Borderline prolonged PR interval Abnormal R-wave progression, early transition Left ventricular hypertrophy Confirmed by Dene Gentry [id number]) on 01/01/2023 1:08:42 PM  Radiology CT HEAD CODE STROKE WO CONTRAST  Result Date: 01/01/2023 CLINICAL DATA:  Code stroke.  Neuro deficit, acute, stroke suspected EXAM: CT HEAD WITHOUT CONTRAST TECHNIQUE: Contiguous axial images were obtained from the base of the skull through the vertex without intravenous contrast. RADIATION DOSE REDUCTION: This exam was performed according to the departmental dose-optimization program which includes automated exposure control, adjustment of the mA and/or kV according to patient size and/or use of iterative reconstruction technique. COMPARISON:  CT head 10/10/2021. FINDINGS: Brain: No evidence of acute infarction, hemorrhage, hydrocephalus, extra-axial collection or mass lesion/mass effect.  Vascular: No hyperdense vessel identified. Skull: No acute fracture. Sinuses/Orbits: Clear sinuses.  No acute orbital findings. Other: No mastoid effusions. ASPECTS [facility] Stroke Program Early CT Score) total score (0-10 with 10 being normal): 10. IMPRESSION: 1. No evidence of acute intracranial abnormality. 2. ASPECTS is 10. Electronically Signed   By: Margaretha Sheffield M.D.   On: 01/01/2023 12:53    Procedures Procedures    Medications Ordered in ED Medications  sodium chloride flush (NS) 0.9 % injection 3 mL (3 mLs Intravenous Not Given 01/01/23 1305)    ED Course/ Medical Decision Making/ A&P                             Medical Decision Making Amount and/or Complexity of Data Reviewed Labs: ordered. Radiology: ordered.  Risk Decision regarding hospitalization.    Medical Screen Complete  This patient presented to the ED with complaint of acute onset left-sided weakness.  This complaint involves an extensive number of treatment options. The initial differential diagnosis includes, but is not limited to, TIA, CVA, metabolic abnormality, etc.  This presentation is: Acute, Self-Limited, Previously Undiagnosed, Uncertain Prognosis, Complicated, Systemic Symptoms, and Threat to Life/Bodily Function  Patient presents with complaint of left-sided weakness and left-sided facial droop.  Last known well was at 11:45 AM.  On arrival to ED symptoms had improved significantly.  Patient seen by neuro team.  Patient felt not to be appropriate for acute neuro intervention.  Symptoms most likely secondary to TIA per neuro.  Patient to be admitted for TIA workup. Additional history obtained:  Additional history obtained from EMS External records from outside sources obtained and reviewed including prior ED visits and prior Inpatient records.    Lab Tests:  I ordered and personally interpreted labs.  The pertinent results include: CBC, CMP, i-STAT Chem-8, EtOH, INR,  CBG   Imaging Studies ordered:  I ordered imaging studies  including CT head I independently visualized and interpreted obtained imaging which showed NAD I agree with the radiologist interpretation.   Cardiac Monitoring:  The patient was maintained on a cardiac monitor.  I personally viewed and interpreted the cardiac monitor which showed an underlying rhythm of: NSR  Problem List / ED Course:  Transient weakness, suspected TIA   Reevaluation:  After the interventions noted above, I reevaluated the patient and found that they have: resolved   Disposition:  After consideration of the diagnostic results and the patients response to treatment, I feel that the patent would benefit from admission.  CRITICAL CARE Performed by: Valarie Merino   Total critical care time: 30 minutes  Critical care time was exclusive of separately billable procedures and treating other patients.  Critical care was necessary to treat or prevent imminent or life-threatening deterioration.  Critical care was time spent personally by me on the following activities: development of treatment plan with patient and/or surrogate as well as nursing, discussions with consultants, evaluation of patient's response to treatment, examination of patient, obtaining history from patient or surrogate, ordering and performing treatments and interventions, ordering and review of laboratory studies, ordering and review of radiographic studies, pulse oximetry and re-evaluation of patient's condition.           Final Clinical Impression(s) / ED Diagnoses Final diagnoses:  TIA (transient ischemic attack)    Rx / DC Orders ED Discharge Orders     None         Valarie Merino, MD 01/01/23 1436

## 2023-01-01 NOTE — Consult Note (Addendum)
NEURO HOSPITALIST CONSULT NOTE   Requestig physician: Dr. Francia Greaves  Reason for Consult: Acute onset of left facial droop, gait unsteadiness and left sided weakness  History obtained from:  EMS and Patient  HPI:                                                                                                                                          Jose [name]. is an 87 y.o. male with a PMHx including stroke risk factors of CAD (on ASA), atrial fibrillation (on Eliquis), HTN and one prior TIA, presenting to the ED via EMS as a Code Stroke. He was normal on awakening today and he continued to feel normal until at 1145 he started to walk to the bathroom and felt unsteady on his feet. He went to sit down and then noted left sided weakness and numbness. EMS was called by his wife and on their arrival they noted left sided weakness and waxing/waning left facial droop. Vitals per EMS: BP 156/86, CBG 90.   He does have a remote history of seizure, last in 1976 (currently not on any seizure medications). Other past neurological history includes, headaches, ulnar neuropathy, left occipital neuralgia and peripheral neuropathy.  EMS was informed by the patient's wife that he has been having memory problems. Both of his parents had dementia. Home medications include donepezil and Namenda.   Past Medical History:  Diagnosis Date   Arthritis    CAD (coronary artery disease)    04/15/18 65% dLAD, 20% pLAD, 30% 2nd diag, Normal EF   Dysrhythmia    afib   Gallstones 07/1996   Headache    couple times a year   History of CT scan of head 1997   small vessel disease   History of ETT 05/2002    no EKG changes   History of MRI of cervical spine 07/15/2002   Hypertension    Lumbar spondylolysis 12/17/2016   MGUS (monoclonal gammopathy of unknown significance) 04/11/2012   Occipital neuralgia of left side 12/09/2018   Peripheral neuropathy    Pneumonia 10/2014   PONV (postoperative nausea  and vomiting)    once many years ago   Primary localized osteoarthritis of left hip 07/08/2018   Primary localized osteoarthrosis of left shoulder 12/25/2016   Primary localized osteoarthrosis of right shoulder 11/05/2017   Rectal bleeding 12/2002   colonoscopy   Seizures ([place])    last seizure 1976   Skin cancer    Sleep apnea    uses CPAP   Spinal stenosis, lumbar region with neurogenic claudication    Stroke [facility])    ?tia ?afib   TIA (transient ischemic attack)    Ulnar neuropathy at elbow 12/24/2014   Left   Ulnar neuropathy at elbow of left  upper extremity 03/29/2015    Past Surgical History:  Procedure Laterality Date   ANTERIOR CERVICAL DECOMP/DISCECTOMY FUSION N/A 02/04/2015   Procedure: ANTERIOR CERVICAL DECOMPRESSION/DISCECTOMY FUSION CERVICAL FIVE-SIX,CERVICAL SIX-SEVEN;  Surgeon: Karie Chimera, MD;  Location: [place] [facility];  Service: Neurosurgery;  Laterality: N/A;   arthroscopic knee surgery     BACK SURGERY     CATARACT EXTRACTION Bilateral    COLONOSCOPY W/ POLYPECTOMY  04/19/2003   tubular adenoma   EYE SURGERY     Bilateral Cataracts   GREAT TOE ARTHRODESIS, INTERPHALANGEAL JOINT Left 1978   JOINT REPLACEMENT     joint replacement on right thumb Right    LEFT HEART CATH AND CORONARY ANGIOGRAPHY N/A 04/15/2018   Procedure: LEFT HEART CATH AND CORONARY ANGIOGRAPHY;  Surgeon: Troy Sine, MD;  Location: [place] CV LAB;  Service: Cardiovascular;  Laterality: N/A;   LUMBAR LAMINECTOMY/DECOMPRESSION MICRODISCECTOMY N/A 07/19/2017   Procedure: LAMINECTOMY AND FORAMINOTOMY  LUMBAR TWO- LUMBAR THREE, LUMBAR THREE- LUMBAR FOUR;  Surgeon: Ashok Pall, MD;  Location: [place];  Service: Neurosurgery;  Laterality: N/A;   nerve transfer surgery to lwft hand Left    Nov. 2017   SHOULDER ARTHROSCOPY W/ ROTATOR CUFF REPAIR Left 2013   rotator cuff    SHOULDER INJECTION  03/2011   left, [facility]   SKIN CANCER EXCISION  07/1993   left lateral arm   SUPERFICIAL KERATECTOMY  Right 05/1998   wrist   thumb injection  03/2011   left, [place]   TOE SURGERY Right    TONSILLECTOMY AND ADENOIDECTOMY     TOTAL HIP ARTHROPLASTY Left 07/08/2018   TOTAL HIP ARTHROPLASTY Left 07/08/2018   Procedure: LEFT TOTAL HIP ARTHROPLASTY;  Surgeon: Marchia Bond, MD;  Location: [place];  Service: Orthopedics;  Laterality: Left;   TOTAL SHOULDER ARTHROPLASTY Left 12/25/2016   Procedure: TOTAL SHOULDER ARTHROPLASTY;  Surgeon: Marchia Bond, MD;  Location: [place];  Service: Orthopedics;  Laterality: Left;   TOTAL SHOULDER ARTHROPLASTY Right 11/05/2017   TOTAL SHOULDER ARTHROPLASTY Right 11/05/2017   Procedure: TOTAL SHOULDER ARTHROPLASTY;  Surgeon: Marchia Bond, MD;  Location: [place];  Service: Orthopedics;  Laterality: Right;   ulnar nerve transfer  12/1999   left   ULNAR NERVE TRANSPOSITION Left     Family History  Problem Relation Age of Onset   Dementia Mother 60       vascular   Transient ischemic attack Mother    Dementia Father        after hip fracture   Hypertension Sister        skin cancer   Seizures Neg Hx    Sleep apnea Neg Hx    Neuropathy Neg Hx              Social History:  reports that he quit smoking about 54 years ago. His smoking use included cigarettes. He has a 26.00 pack-year smoking history. He has never used smokeless tobacco. He reports current alcohol use of about 2.0 standard drinks of alcohol per week. He reports that he does not use drugs.  Allergies  Allergen Reactions   Maprotiline Other (See Comments)    TETRACYCLIC ANTIDEPRESSANTS UNSPECIFIED REACTION (patient is unaware of allergy) TETRACYCLIC ANTIDEPRESSANTS UNSPECIFIED REACTION (patient is unaware of allergy)   Tetracycline Hcl Itching and Rash    Rash on hands    HOME MEDICATIONS:  No current facility-administered medications on file prior to encounter.   Current  Outpatient Medications on File Prior to Encounter  Medication Sig Dispense Refill   acetaminophen (TYLENOL) 650 MG CR tablet 2 tablets as needed     amLODipine (NORVASC) 5 MG tablet TAKE 1 TABLET DAILY     amoxicillin (AMOXIL) 500 MG capsule Take 4 capsules by mouth 1 hour prior to dental appointment (Patient not taking: Reported on 12/03/2022) 12 capsule 0   amoxicillin-clavulanate (AUGMENTIN) 875-125 MG tablet Take 1 tablet by mouth 2 (two) times daily for 10 days (Patient not taking: Reported on 12/03/2022) 20 tablet 0   apixaban (ELIQUIS) 5 MG TABS tablet TAKE 1 TABLET TWICE A DAY 180 tablet 0   aspirin 81 MG chewable tablet 1 tab     atorvastatin (LIPITOR) 80 MG tablet 1 tablet     Bromfenac Sodium (PROLENSA) 0.07 % SOLN Administer 1 drop into left eye three times daily. 3 mL 0   cephALEXin (KEFLEX) 500 MG capsule Take 1 capsule (500 mg total) by mouth 3 (three) times daily for 7 days 21 capsule 0   chlorhexidine (PERIDEX) 0.12 % solution Swish and spit 59ms by mouth 2 times daily after brushing teeth for 30 seconds. (Patient not taking: Reported on 12/03/2022) 473 mL 0   donepezil (ARICEPT) 10 MG tablet Take 1 tablet (10 mg total) by mouth at bedtime. 90 tablet 0   donepezil (ARICEPT) 5 MG tablet Take 1 tablet by mouth nightly 30 tablet 1   fluticasone (FLONASE) 50 MCG/ACT nasal spray Place 2 sprays into both nostrils daily. 16 g 1   isosorbide mononitrate (IMDUR) 30 MG 24 hr tablet TAKE 1 TABLET TWICE A DAY (DOSE INCREASE) 180 tablet 3   labetalol (NORMODYNE) 200 MG tablet Take 100 mg by mouth 2 (two) times daily.     loratadine (CLARITIN) 10 MG tablet Take 1 tablet (10 mg total) by mouth daily for 14 days. 14 tablet 0   losartan (COZAAR) 50 MG tablet Take 1 tablet by mouth daily.     memantine (NAMENDA) 5 MG tablet Take 1 tablet by mouth daily for 3 weeks, then take 1 tablet by mouth twice a day (Patient taking differently: 10 mg.) 60 tablet 1   memantine (NAMENDA) 5 MG tablet Take 1 tablet by  mouth daily X 3 weeks, then take 1 tablet twice a day thereafter (Patient taking differently: 5 mg 2 (two) times daily. Take 1 tablet by mouth daily X 3 weeks, then take 1 tablet twice a day thereafter) 60 tablet 1   methylPREDNISolone (MEDROL) 4 MG TBPK tablet Take by mouth as directed. Take as directed on pack over 6 days. 21 tablet 0   methylPREDNISolone (MEDROL) 4 MG TBPK tablet Take as directed on pack for 6 days. 21 each 0   Multiple Vitamins-Minerals (PRESERVISION AREDS 2) CAPS Take 1 capsule by mouth 2 (two) times daily.      nitroGLYCERIN (NITROSTAT) 0.4 MG SL tablet PLACE 1 TABLET (0.4 MG TOTAL) UNDER THE TONGUE EVERY 5 (FIVE) MINUTES X 3 DOSES AS NEEDED FOR CHEST PAIN. 75 tablet 2   oxyCODONE (OXY IR/ROXICODONE) 5 MG immediate release tablet Take 1 tablet (5 mg total) by mouth every 4 (four) to 6 (six) hours as needed for pain 20 tablet 0   ROCKLATAN 0.02-0.005 % SOLN INSTILL 1 DROP INTO THE RIGHT EYE QHS     RSV vaccine recomb adjuvanted (AREXVY) 120 MCG/0.5ML injection Inject into the muscle. 1 each 0  ROS:                                                                                                                                       Denies any vision changes, headache, CP or SOB. Has chronic neck pain. Other ROS as per HPI.   BP 133/86 (BP Location: Left Arm)   Pulse (!) 58   Temp 97.6 F (36.4 C) (Oral)   Resp 18   Wt 105.4 kg   SpO2 96%   BMI 31.51 kg/m    General Examination:                                                                                                       Physical Exam  HEENT-  [place]/AT    Lungs- Respirations unlabored Extremities- No edema  Neurological Examination Mental Status: Awake and alert. Mildly anxious affect. Speech is fluent and nondysarthric with intact naming, repetition and comprehension. Oriented to the hospital but had trouble remembering the city. Oriented to the state. Did not know the correct year, month or day.  Cranial  Nerves: II: Visual fields intact bilaterally. PERRL.   III,IV, VI: EOMI. No ptosis. No nystagmus.  V: Temp sensation equal bilaterally VII: Smile symmetric VIII: HOH. Wears hearing aids.  IX,X: No hypophonia or hoarseness XI: Symmetric shoulder shrug XII: Midline tongue extension Motor: Right : Upper extremity   5/5    Left:     Upper extremity   5/5  Lower extremity   5/5     Lower extremity   5/5 No pronator drift.  Sensory: Subjective numbness to FT involving digits 2-5 of left hand. Otherwise with normal FT sensation x 4, without asymmetry. No extinction to DSS.  Deep Tendon Reflexes: 1+ and symmetric upper extremity reflexes. Hypoactive patellar and achilles reflexes. Toes mute.  Cerebellar: No ataxia with FNF or H-S bilaterally Gait: Deferred   Lab Results: Basic Metabolic Panel: Recent Labs  Lab 01/01/23 1236  NA 142  K 3.6  CL 104  GLUCOSE 125*  BUN 22  CREATININE 0.70    CBC: Recent Labs  Lab 01/01/23 1236  HGB 12.2*  HCT 36.0*    Cardiac Enzymes: No results for input(s): "CKTOTAL", "CKMB", "CKMBINDEX", "TROPONINI" in the last 168 hours.  Lipid Panel: No results for input(s): "CHOL", "TRIG", "HDL", "CHOLHDL", "VLDL", "LDLCALC" in the last 168 hours.  Imaging: No results found.   Assessment: 87 year old male presenting with left facial droop and left sided  weakness with numbness. LKN 1145.  - Exam reveals numbness of digits 2-5 of his left hand. No aphasia, motor weakness, facial droop, visual field cut or ataxia noted. NIHSS 1.  - CT head: No evidence of acute intracranial abnormality. ASPECTS is 10. - Overall presentation is most consistent with TIA. Minimal residual numbness of the digits of left hand without involvement of the arm more proximally would be atypical for a vascular etiology, but the weakness and facial droop do militate in favor of TIA.  - Risks of TNK are felt to outweigh benefits given low NIHSS: Symptoms too mild to treat.  - Most  likely etiologies would be cardioembolic versus chronic small vessel disease.  - Stroke risk factors: CAD, atrial fibrillation, HTN and prior TIA  Recommendations: 1. HgbA1c, fasting lipid panel 2. MRI of the brain without contrast 3. PT consult, OT consult, Speech consult 4. Echocardiogram 5. Given advanced age, benefits of statin may be outweighed by risks 6. Continue his Eliquis and ASA  7. CTA of head and neck 8. Telemetry monitoring 9. Frequent neuro checks 10. NPO until passes stroke swallow screen 11. Continue his home donepezil and Namenda   Electronically signed: Dr. Kerney Elbe 01/01/2023, 12:39 PM

## 2023-01-01 NOTE — ED Notes (Signed)
ED TO INPATIENT HANDOFF REPORT  ED Nurse Name and Phone #: Richardson Landry [phone]  S Name/Age/Gender Jose [name]. 87 y.o. male Room/Bed: 002C/002C  Code Status   Code Status: Full Code  Home/SNF/Other Home Patient oriented to: self, place, and situation Is this baseline? Yes   Triage Complete: Triage complete  Chief Complaint TIA (transient ischemic attack) [G45.9]  Triage Note Patient reports when he got out of bed to get changed to shower he felt unsteady and had decreased coordination. Patient then lay down back in bed and noted left sided weakness. EMS reported left sided facial droop during transport. No facial droop on arrival to ED. Takes Eliquis. Hx of TIA. Patient denies chest pain or shortness of breath.   Allergies Allergies  Allergen Reactions   Maprotiline Other (See Comments)    TETRACYCLIC ANTIDEPRESSANTS UNSPECIFIED REACTION (patient is unaware of allergy) TETRACYCLIC ANTIDEPRESSANTS UNSPECIFIED REACTION (patient is unaware of allergy)   Tetracycline Hcl Itching and Rash    Rash on hands    Level of Care/Admitting Diagnosis ED Disposition     ED Disposition  Admit   Condition  --   Comment  Hospital Area: [place] [[id number]]  Level of Care: Telemetry Medical [104]  May place patient in observation at [facility] or [place] if equivalent level of care is available:: No  Covid Evaluation: Asymptomatic - no recent exposure (last 10 days) testing not required  Diagnosis: TIA (transient ischemic attack) [id number]  Admitting Physician: Marcelyn Bruins [id number]  Attending Physician: Marcelyn Bruins [id number]          B Medical/Surgery History Past Medical History:  Diagnosis Date   Arthritis    CAD (coronary artery disease)    04/15/18 65% dLAD, 20% pLAD, 30% 2nd diag, Normal EF   Dysrhythmia    afib   Gallstones 07/1996   Headache    couple times a year   History of CT scan of head 1997   small vessel disease    History of ETT 05/2002    no EKG changes   History of MRI of cervical spine 07/15/2002   Hypertension    Lumbar spondylolysis 12/17/2016   MGUS (monoclonal gammopathy of unknown significance) 04/11/2012   Occipital neuralgia of left side 12/09/2018   Peripheral neuropathy    Pneumonia 10/2014   PONV (postoperative nausea and vomiting)    once many years ago   Primary localized osteoarthritis of left hip 07/08/2018   Primary localized osteoarthrosis of left shoulder 12/25/2016   Primary localized osteoarthrosis of right shoulder 11/05/2017   Rectal bleeding 12/2002   colonoscopy   Seizures ([place])    last seizure 1976   Skin cancer    Sleep apnea    uses CPAP   Spinal stenosis, lumbar region with neurogenic claudication    Stroke [facility])    ?tia ?afib   TIA (transient ischemic attack)    Ulnar neuropathy at elbow 12/24/2014   Left   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 extremity 03/29/2015   Past Surgical History:  Procedure Laterality Date   ANTERIOR CERVICAL DECOMP/DISCECTOMY FUSION N/A 02/04/2015   Procedure: ANTERIOR CERVICAL DECOMPRESSION/DISCECTOMY FUSION CERVICAL FIVE-SIX,CERVICAL SIX-SEVEN;  Surgeon: Karie Chimera, MD;  Location: [facility];  Service: Neurosurgery;  Laterality: N/A;   arthroscopic knee surgery     BACK SURGERY     CATARACT EXTRACTION Bilateral    COLONOSCOPY W/ POLYPECTOMY  04/19/2003   tubular adenoma   EYE SURGERY  Bilateral Cataracts   GREAT TOE ARTHRODESIS, INTERPHALANGEAL JOINT Left 1978   JOINT REPLACEMENT     joint replacement on right thumb Right    LEFT HEART CATH AND CORONARY ANGIOGRAPHY N/A 04/15/2018   Procedure: LEFT HEART CATH AND CORONARY ANGIOGRAPHY;  Surgeon: Troy Sine, MD;  Location: [facility];  Service: Cardiovascular;  Laterality: N/A;   LUMBAR LAMINECTOMY/DECOMPRESSION MICRODISCECTOMY N/A 07/19/2017   Procedure: LAMINECTOMY AND FORAMINOTOMY  LUMBAR TWO- LUMBAR THREE, LUMBAR THREE- LUMBAR FOUR;  Surgeon: Ashok Pall,  MD;  Location: [facility];  Service: Neurosurgery;  Laterality: N/A;   nerve transfer surgery to lwft hand Left    Nov. 2017   SHOULDER ARTHROSCOPY W/ ROTATOR CUFF REPAIR Left 2013   rotator cuff    SHOULDER INJECTION  03/2011   left, [facility]   SKIN CANCER EXCISION  07/1993   left lateral arm   SUPERFICIAL KERATECTOMY Right 05/1998   wrist   thumb injection  03/2011   left, [place]   TOE SURGERY Right    TONSILLECTOMY AND ADENOIDECTOMY     TOTAL HIP ARTHROPLASTY Left 07/08/2018   TOTAL HIP ARTHROPLASTY Left 07/08/2018   Procedure: LEFT TOTAL HIP ARTHROPLASTY;  Surgeon: Marchia Bond, MD;  Location: [facility];  Service: Orthopedics;  Laterality: Left;   TOTAL SHOULDER ARTHROPLASTY Left 12/25/2016   Procedure: TOTAL SHOULDER ARTHROPLASTY;  Surgeon: Marchia Bond, MD;  Location: [facility];  Service: Orthopedics;  Laterality: Left;   TOTAL SHOULDER ARTHROPLASTY Right 11/05/2017   TOTAL SHOULDER ARTHROPLASTY Right 11/05/2017   Procedure: TOTAL SHOULDER ARTHROPLASTY;  Surgeon: Marchia Bond, MD;  Location: [facility];  Service: Orthopedics;  Laterality: Right;   ulnar nerve transfer  12/1999   left   ULNAR NERVE TRANSPOSITION Left      A IV Location/Drains/Wounds Patient Lines/Drains/Airways Status     Active Line/Drains/Airways     Name Placement date Placement time Site Days   Peripheral IV 01/01/23 18 G Right Antecubital 01/01/23  1235  Antecubital  less than 1            Intake/Output Last 24 hours  Intake/Output Summary (Last 24 hours) at 01/01/2023 1722 Last data filed at 01/01/2023 1341 Gross per 24 hour  Intake --  Output 400 ml  Net -400 ml    Labs/Imaging Results for orders placed or performed during the hospital encounter of 01/01/23 (from the past 48 hour(s))  CBG monitoring, ED     Status: Abnormal   Collection Time: 01/01/23 12:32 PM  Result Value Ref Range   Glucose-Capillary 126 (H) 70 - 99 mg/dL    Comment: Glucose reference range applies only to samples taken after  fasting for at least 8 hours.  Protime-INR     Status: Abnormal   Collection Time: 01/01/23 12:34 PM  Result Value Ref Range   Prothrombin Time 16.9 (H) 11.4 - 15.2 seconds   INR 1.4 (H) 0.8 - 1.2    Comment: (NOTE) INR goal varies based on device and disease states. Performed at [facility], [place] [address] [place], [place] [address]   APTT     Status: None   Collection Time: 01/01/23 12:34 PM  Result Value Ref Range   aPTT 29 24 - 36 seconds    Comment: Performed at [facility] [address], [place], [place] [address]  CBC     Status: Abnormal   Collection Time: 01/01/23 12:34 PM  Result Value Ref Range   WBC 5.3 4.0 - 10.5  K/uL   RBC 3.68 (L) 4.22 - 5.81 MIL/uL   Hemoglobin 12.4 (L) 13.0 - 17.0 g/dL   HCT 38.0 (L) 39.0 - 52.0 %   MCV 103.3 (H) 80.0 - 100.0 fL   MCH 33.7 26.0 - 34.0 pg   MCHC 32.6 30.0 - 36.0 g/dL   RDW 13.3 11.5 - 15.5 %   Platelets 134 (L) 150 - 400 K/uL   nRBC 0.0 0.0 - 0.2 %    Comment: Performed at [facility] [address] [place], [place] [address]  Differential     Status: None   Collection Time: 01/01/23 12:34 PM  Result Value Ref Range   Neutrophils Relative % 63 %   Neutro Abs 3.3 1.7 - 7.7 K/uL   Lymphocytes Relative 27 %   Lymphs Abs 1.4 0.7 - 4.0 K/uL   Monocytes Relative 7 %   Monocytes Absolute 0.4 0.1 - 1.0 K/uL   Eosinophils Relative 2 %   Eosinophils Absolute 0.1 0.0 - 0.5 K/uL   Basophils Relative 1 %   Basophils Absolute 0.0 0.0 - 0.1 K/uL   Immature Granulocytes 0 %   Abs Immature Granulocytes 0.02 0.00 - 0.07 K/uL    Comment: Performed at [facility] [address], [place], [place] [address]  Comprehensive metabolic panel     Status: Abnormal   Collection Time: 01/01/23 12:34 PM  Result Value Ref Range   Sodium 140 135 - 145 mmol/L   Potassium 3.5 3.5 - 5.1 mmol/L   Chloride 107 98 - 111 mmol/L   CO2 21 (L) 22 - 32 mmol/L   Glucose, Bld 129 (H) 70 - 99 mg/dL    Comment: Glucose  reference range applies only to samples taken after fasting for at least 8 hours.   BUN 19 8 - 23 mg/dL   Creatinine, Ser 0.82 0.61 - 1.24 mg/dL   Calcium 8.7 (L) 8.9 - 10.3 mg/dL   Total Protein 5.8 (L) 6.5 - 8.1 g/dL   Albumin 3.7 3.5 - 5.0 g/dL   AST 31 15 - 41 U/L   ALT 33 0 - 44 U/L   Alkaline Phosphatase 46 38 - 126 U/L   Total Bilirubin 0.7 0.3 - 1.2 mg/dL   GFR, Estimated >60 >60 mL/min    Comment: (NOTE) Calculated using the CKD-EPI Creatinine Equation (2021)    Anion gap 12 5 - 15    Comment: Performed at [facility], [place] [address] [place], [place] [address]  Ethanol     Status: None   Collection Time: 01/01/23 12:34 PM  Result Value Ref Range   Alcohol, Ethyl (B) <10 <10 mg/dL    Comment: (NOTE) Lowest detectable limit for serum alcohol is 10 mg/dL.  For medical purposes only. Performed at [facility], [place] [address] [place], [place] [address]   I-stat chem 8, ED     Status: Abnormal   Collection Time: 01/01/23 12:36 PM  Result Value Ref Range   Sodium 142 135 - 145 mmol/L   Potassium 3.6 3.5 - 5.1 mmol/L   Chloride 104 98 - 111 mmol/L   BUN 22 8 - 23 mg/dL   Creatinine, Ser 0.70 0.61 - 1.24 mg/dL   Glucose, Bld 125 (H) 70 - 99 mg/dL    Comment: Glucose reference range applies only to samples taken after fasting for at least 8 hours.   Calcium, Ion 1.10 (L) 1.15 - 1.40 mmol/L   TCO2 28  22 - 32 mmol/L   Hemoglobin 12.2 (L) 13.0 - 17.0 g/dL   HCT 36.0 (L) 39.0 - 52.0 %   MR BRAIN WO CONTRAST  Result Date: 01/01/2023 CLINICAL DATA:  Neuro deficit, acute, stroke suspected. Left-sided weakness, facial droop, and unsteady gait. EXAM: MRI HEAD WITHOUT CONTRAST TECHNIQUE: Multiplanar, multiecho pulse sequences of the brain and surrounding structures were obtained without intravenous contrast. COMPARISON:  Head CT 01/01/2023 and MRI 02/22/2016 FINDINGS: Brain: There is no evidence of an acute infarct, mass, midline shift, or extra-axial fluid collection.  Superficial siderosis over the left frontal lobe is new from 2017 and consistent with interval but nonacute subarachnoid hemorrhage. Scattered T2 hyperintensities in the cerebral white matter bilaterally are similar to the prior MRI and are nonspecific but compatible with mild chronic small vessel ischemic disease. Cerebral atrophy is mild for age. Vascular: Major intracranial vascular flow voids are preserved. Skull and upper cervical spine: Unremarkable bone marrow signal. Sinuses/Orbits: Bilateral cataract extraction. Clear paranasal sinuses. Trace left mastoid fluid. Other: None. IMPRESSION: 1. No acute intracranial abnormality. 2. Mild chronic small vessel ischemic disease. 3. 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 Electronically Signed   By: Logan Bores M.D.   On: 01/01/2023 16:29   CT HEAD CODE STROKE WO CONTRAST  Result Date: 01/01/2023 CLINICAL DATA:  Code stroke.  Neuro deficit, acute, stroke suspected EXAM: CT HEAD WITHOUT CONTRAST TECHNIQUE: Contiguous axial images were obtained from the base of the skull through the vertex without intravenous contrast. RADIATION DOSE REDUCTION: This exam was performed according to the departmental dose-optimization program which includes automated exposure control, adjustment of the mA and/or kV according to patient size and/or use of iterative reconstruction technique. COMPARISON:  CT head 10/10/2021. FINDINGS: Brain: No evidence of acute infarction, hemorrhage, hydrocephalus, extra-axial collection or mass lesion/mass effect. Vascular: No hyperdense vessel identified. Skull: No acute fracture. Sinuses/Orbits: Clear sinuses.  No acute orbital findings. Other: No mastoid effusions. ASPECTS [facility] Stroke Program Early CT Score) total score (0-10 with 10 being normal): 10. IMPRESSION: 1. No evidence of acute intracranial abnormality. 2. ASPECTS is 10. Electronically Signed   By: Margaretha Sheffield M.D.   On: 01/01/2023 12:53     Pending Labs Unresulted Labs (From admission, onward)     Start     Ordered   01/02/23 0500  Lipid panel  (Labs)  Tomorrow morning,   R       Comments: Fasting    01/01/23 1525   01/02/23 0500  Hemoglobin A1c  (Labs)  Tomorrow morning,   R       Comments: To assess prior glycemic control    01/01/23 1525   01/02/23 0500  CBC  Tomorrow morning,   R        01/01/23 1525   01/02/23 0500  Comprehensive metabolic panel  Tomorrow morning,   R        01/01/23 1525            Vitals/Pain Today's Vitals   01/01/23 1300 01/01/23 1301 01/01/23 1530 01/01/23 1646  BP: 133/86  (!) 145/111   Pulse: (!) 58  (!) 53   Resp: 18  15   Temp: 97.6 F (36.4 C)   97.8 F (36.6 C)  TempSrc: Oral     SpO2: 96%  93%   Weight:      PainSc:  0-No pain      Isolation Precautions No active isolations  Medications Medications  sodium chloride flush (NS) 0.9 % injection  3 mL (3 mLs Intravenous Not Given 01/01/23 1305)  aspirin chewable tablet 81 mg (has no administration in time range)  memantine (NAMENDA) tablet 5 mg (has no administration in time range)  donepezil (ARICEPT) tablet 10 mg (has no administration in time range)  apixaban (ELIQUIS) tablet 5 mg (has no administration in time range)   stroke: early stages of recovery book (has no administration in time range)  acetaminophen (TYLENOL) tablet 650 mg (has no administration in time range)    Or  acetaminophen (TYLENOL) 160 MG/5ML solution 650 mg (has no administration in time range)    Or  acetaminophen (TYLENOL) suppository 650 mg (has no administration in time range)  senna-docusate (Senokot-S) tablet 1 tablet (has no administration in time range)    Mobility walks     Focused Assessments Neuro Assessment Handoff:  Swallow screen pass? Yes  Cardiac Rhythm: Sinus bradycardia NIH Stroke Scale  Dizziness Present: No Headache Present: No Interval: Initial Level of Consciousness (1a.)   : Alert, keenly responsive LOC  Questions (1b. )   : Answers both questions correctly LOC Commands (1c. )   : Performs both tasks correctly Best Gaze (2. )  : Normal Visual (3. )  : No visual loss Facial Palsy (4. )    : Normal symmetrical movements Motor Arm, Left (5a. )   : No drift Motor Arm, Right (5b. ) : No drift Motor Leg, Left (6a. )  : No drift Motor Leg, Right (6b. ) : No drift Limb Ataxia (7. ): Present in two limbs Sensory (8. )  : Normal, no sensory loss Best Language (9. )  : No aphasia Dysarthria (10. ): Normal Extinction/Inattention (11.)   : No Abnormality Complete NIHSS TOTAL: 2 Last date known well: 01/01/23 Last time known well: 1145 Neuro Assessment: Exceptions to WDL (patient reports numbness in feet) Neuro Checks:   Initial (01/01/23 1300)  Has TPA been given? No If patient is a Neuro Trauma and patient is going to OR before floor call report to [facility] nurse: [phone] or [phone]   R Recommendations: See Admitting Provider Note  Report given to:   Additional Notes:

## 2023-01-01 NOTE — ED Triage Notes (Signed)
Patient reports when he got out of bed to get changed to shower he felt unsteady and had decreased coordination. Patient then lay down back in bed and noted left sided weakness. EMS reported left sided facial droop during transport. No facial droop on arrival to ED. Takes Eliquis. Hx of TIA. Patient denies chest pain or shortness of breath.

## 2023-01-01 NOTE — H&P (Signed)
History and Physical   Jose [name]. [id number] DOB: [birth year] DOA: 01/01/2023  PCP: Debroah Loop, PA-C   Patient coming from: Home  Chief Complaint: Code stroke  HPI: Jose [name]. is a 87 y.o. male with medical history significant of TIA, atrial fibrillation, MGUS, memory loss, bronchiectasis, gout, occipital neuralgia, paresthesia, lumbar stenosis, hypertension, hyperlipidemia, BPH, CAD presenting as a code stroke.  Patient reports around 11:45 AM today began to develop unsteady gait and left-sided weakness.  Did also report some transient left facial droop.  He was transported to the ED by EMS with a code stroke called and route.  Symptoms significantly proved by the time he arrived and neurology who saw patient on arrival more suspicious for TIA.  He denies fevers, chills, chest pain, shortness of breath, abdominal pain, constipation, diarrhea, nausea, vomiting.  ED Course: Vital signs in the ED significant for heart rate in the 50s, blood pressure in the [id number] to [id number] systolic.  Lab workup included CMP with bicarb 21, glucose 129, calcium 8.7, protein 8.5.  CBC with hemoglobin stable at 12.4, platelets 134.  PT and INR mildly elevated at 6.91.4 respectively.  Ethanol level negative.  CT head showed no acute normality.  MR brain pending.  Neurology consulted as above and recommended TIA workup.  Review of Systems: As per HPI otherwise all other systems reviewed and are negative.  Past Medical History:  Diagnosis Date   Arthritis    CAD (coronary artery disease)    04/15/18 65% dLAD, 20% pLAD, 30% 2nd diag, Normal EF   Dysrhythmia    afib   Gallstones 07/1996   Headache    couple times a year   History of CT scan of head 1997   small vessel disease   History of ETT 05/2002    no EKG changes   History of MRI of cervical spine 07/15/2002   Hypertension    Lumbar spondylolysis 12/17/2016   MGUS (monoclonal gammopathy of unknown significance) 04/11/2012   Occipital  neuralgia of left side 12/09/2018   Peripheral neuropathy    Pneumonia 10/2014   PONV (postoperative nausea and vomiting)    once many years ago   Primary localized osteoarthritis of left hip 07/08/2018   Primary localized osteoarthrosis of left shoulder 12/25/2016   Primary localized osteoarthrosis of right shoulder 11/05/2017   Rectal bleeding 12/2002   colonoscopy   Seizures ([place])    last seizure 1976   Skin cancer    Sleep apnea    uses CPAP   Spinal stenosis, lumbar region with neurogenic claudication    Stroke [facility])    ?tia ?afib   TIA (transient ischemic attack)    Ulnar neuropathy at elbow 12/24/2014   Left   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 extremity 03/29/2015    Past Surgical History:  Procedure Laterality Date   ANTERIOR CERVICAL DECOMP/DISCECTOMY FUSION N/A 02/04/2015   Procedure: ANTERIOR CERVICAL DECOMPRESSION/DISCECTOMY FUSION CERVICAL FIVE-SIX,CERVICAL SIX-SEVEN;  Surgeon: Karie Chimera, MD;  Location: [place] [facility];  Service: Neurosurgery;  Laterality: N/A;   arthroscopic knee surgery     BACK SURGERY     CATARACT EXTRACTION Bilateral    COLONOSCOPY W/ POLYPECTOMY  04/19/2003   tubular adenoma   EYE SURGERY     Bilateral Cataracts   GREAT TOE ARTHRODESIS, INTERPHALANGEAL JOINT Left 1978   JOINT REPLACEMENT     joint replacement on right thumb Right    LEFT HEART CATH AND CORONARY ANGIOGRAPHY N/A 04/15/2018  Procedure: LEFT HEART CATH AND CORONARY ANGIOGRAPHY;  Surgeon: Troy Sine, MD;  Location: [place] [facility];  Service: Cardiovascular;  Laterality: N/A;   LUMBAR LAMINECTOMY/DECOMPRESSION MICRODISCECTOMY N/A 07/19/2017   Procedure: LAMINECTOMY AND FORAMINOTOMY  LUMBAR TWO- LUMBAR THREE, LUMBAR THREE- LUMBAR FOUR;  Surgeon: Ashok Pall, MD;  Location: [place];  Service: Neurosurgery;  Laterality: N/A;   nerve transfer surgery to lwft hand Left    Nov. 2017   SHOULDER ARTHROSCOPY W/ ROTATOR CUFF REPAIR Left 2013   rotator cuff    SHOULDER INJECTION   03/2011   left, [facility]   SKIN CANCER EXCISION  07/1993   left lateral arm   SUPERFICIAL KERATECTOMY Right 05/1998   wrist   thumb injection  03/2011   left, [place]   TOE SURGERY Right    TONSILLECTOMY AND ADENOIDECTOMY     TOTAL HIP ARTHROPLASTY Left 07/08/2018   TOTAL HIP ARTHROPLASTY Left 07/08/2018   Procedure: LEFT TOTAL HIP ARTHROPLASTY;  Surgeon: Marchia Bond, MD;  Location: [place];  Service: Orthopedics;  Laterality: Left;   TOTAL SHOULDER ARTHROPLASTY Left 12/25/2016   Procedure: TOTAL SHOULDER ARTHROPLASTY;  Surgeon: Marchia Bond, MD;  Location: [place];  Service: Orthopedics;  Laterality: Left;   TOTAL SHOULDER ARTHROPLASTY Right 11/05/2017   TOTAL SHOULDER ARTHROPLASTY Right 11/05/2017   Procedure: TOTAL SHOULDER ARTHROPLASTY;  Surgeon: Marchia Bond, MD;  Location: [place];  Service: Orthopedics;  Laterality: Right;   ulnar nerve transfer  12/1999   left   ULNAR NERVE TRANSPOSITION Left     Social History  reports that he quit smoking about 54 years ago. His smoking use included cigarettes. He has a 26.00 pack-year smoking history. He has never used smokeless tobacco. He reports current alcohol use of about 2.0 standard drinks of alcohol per week. He reports that he does not use drugs.  Allergies  Allergen Reactions   Maprotiline Other (See Comments)    TETRACYCLIC ANTIDEPRESSANTS UNSPECIFIED REACTION (patient is unaware of allergy) TETRACYCLIC ANTIDEPRESSANTS UNSPECIFIED REACTION (patient is unaware of allergy)   Tetracycline Hcl Itching and Rash    Rash on hands    Family History  Problem Relation Age of Onset   Dementia Mother 36       vascular   Transient ischemic attack Mother    Dementia Father        after hip fracture   Hypertension Sister        skin cancer   Seizures Neg Hx    Sleep apnea Neg Hx    Neuropathy Neg Hx   Reviewed on admission  Prior to Admission medications   Medication Sig Start Date End Date Taking? Authorizing Provider   acetaminophen (TYLENOL) 650 MG CR tablet 2 tablets as needed    [provider]  amLODipine (NORVASC) 5 MG tablet TAKE 1 TABLET DAILY 06/08/19   [provider]  amoxicillin (AMOXIL) 500 MG capsule Take 4 capsules by mouth 1 hour prior to dental appointment Patient not taking: Reported on 12/03/2022 06/20/22     amoxicillin-clavulanate (AUGMENTIN) 875-125 MG tablet Take 1 tablet by mouth 2 (two) times daily for 10 days Patient not taking: Reported on 12/03/2022 08/29/22     apixaban (ELIQUIS) 5 MG TABS tablet TAKE 1 TABLET TWICE A DAY 12/20/21   Burnell Blanks, MD  aspirin 81 MG chewable tablet 1 tab    [provider]  atorvastatin (LIPITOR) 80 MG tablet 1 tablet    [provider]  Bromfenac Sodium (PROLENSA) 0.07 %  SOLN Administer 1 drop into left eye three times daily. 12/14/22     cephALEXin (KEFLEX) 500 MG capsule Take 1 capsule (500 mg total) by mouth 3 (three) times daily for 7 days 07/09/22     chlorhexidine (PERIDEX) 0.12 % solution Swish and spit 66ms by mouth 2 times daily after brushing teeth for 30 seconds. Patient not taking: Reported on 12/03/2022 04/16/22     donepezil (ARICEPT) 10 MG tablet Take 1 tablet (10 mg total) by mouth at bedtime. 06/21/22     donepezil (ARICEPT) 5 MG tablet Take 1 tablet by mouth nightly 04/16/22     fluticasone (FLONASE) 50 MCG/ACT nasal spray Place 2 sprays into both nostrils daily. 08/29/22     isosorbide mononitrate (IMDUR) 30 MG 24 hr tablet TAKE 1 TABLET TWICE A DAY (DOSE INCREASE) 11/03/21   MBurnell Blanks MD  labetalol (NORMODYNE) 200 MG tablet Take 100 mg by mouth 2 (two) times daily.    [provider]  loratadine (CLARITIN) 10 MG tablet Take 1 tablet (10 mg total) by mouth daily for 14 days. 06/02/21     losartan (COZAAR) 50 MG tablet Take 1 tablet by mouth daily. 02/25/20   [provider]  memantine (NAMENDA) 5 MG tablet Take 1 tablet by mouth daily for 3 weeks, then take 1 tablet by  mouth twice a day Patient taking differently: 10 mg. 06/21/22     memantine (NAMENDA) 5 MG tablet Take 1 tablet by mouth daily X 3 weeks, then take 1 tablet twice a day thereafter Patient taking differently: 5 mg 2 (two) times daily. Take 1 tablet by mouth daily X 3 weeks, then take 1 tablet twice a day thereafter 07/23/22     methylPREDNISolone (MEDROL) 4 MG TBPK tablet Take by mouth as directed. Take as directed on pack over 6 days. 02/16/22     methylPREDNISolone (MEDROL) 4 MG TBPK tablet Take as directed on pack for 6 days. 12/10/22     Multiple Vitamins-Minerals (PRESERVISION AREDS 2) CAPS Take 1 capsule by mouth 2 (two) times daily.     [provider]  nitroGLYCERIN (NITROSTAT) 0.4 MG SL tablet PLACE 1 TABLET (0.4 MG TOTAL) UNDER THE TONGUE EVERY 5 (FIVE) MINUTES X 3 DOSES AS NEEDED FOR CHEST PAIN. 03/03/21   MBurnell Blanks MD  oxyCODONE (OXY IR/ROXICODONE) 5 MG immediate release tablet Take 1 tablet (5 mg total) by mouth every 4 (four) to 6 (six) hours as needed for pain 05/29/22     ROCKLATAN 0.02-0.005 % SOLN INSTILL 1 DROP INTO THE RIGHT EYE QHS 04/10/19   [provider]  RSV vaccine recomb adjuvanted (AREXVY) 120 MCG/0.5ML injection Inject into the muscle. 08/29/22   SCarlyle Basques MD    Physical Exam: Vitals:   01/01/23 1200 01/01/23 1300  BP:  133/86  Pulse:  (!) 58  Resp:  18  Temp:  97.6 F (36.4 C)  TempSrc:  Oral  SpO2:  96%  Weight: 105.4 kg     Physical Exam Constitutional:      General: He is not in acute distress.    Appearance: Normal appearance.  HENT:     Head: Normocephalic and atraumatic.     Mouth/Throat:     Mouth: Mucous membranes are moist.     Pharynx: Oropharynx is clear.  Eyes:     Extraocular Movements: Extraocular movements intact.     Pupils: Pupils are equal, round, and reactive to light.  Cardiovascular:     Rate and  Rhythm: Normal rate and regular rhythm.     Pulses: Normal pulses.     Heart sounds: Normal heart  sounds.  Pulmonary:     Effort: Pulmonary effort is normal. No respiratory distress.     Breath sounds: Normal breath sounds.  Abdominal:     General: Bowel sounds are normal. There is no distension.     Palpations: Abdomen is soft.     Tenderness: There is no abdominal tenderness.  Musculoskeletal:        General: No swelling or deformity.  Skin:    General: Skin is warm and dry.  Neurological:     Comments: Mental Status: Patient is awake, alert, oriented No signs of aphasia or neglect Cranial Nerves: II: Pupils equal, round, and reactive to light.   III,IV, VI: EOMI without ptosis or diploplia.  V: Facial sensation is symmetric to light touch. VII: Facial movement is symmetric.  VIII: hearing is intact to voice X: Uvula elevates symmetrically XI: Shoulder shrug is symmetric. XII: tongue is midline without atrophy or fasciculations.  Motor: Good effort thorughout, at Least 5/5 bilateral UE, 5/5 bilateral lower extremitiy  Sensory: Sensation is grossly intact bilateral UEs & LEs Cerebellar: Finger-Nose intact bilalat    Labs on Admission: I have personally reviewed following labs and imaging studies  CBC: Recent Labs  Lab 01/01/23 1234 01/01/23 1236  WBC 5.3  --   NEUTROABS 3.3  --   HGB 12.4* 12.2*  HCT 38.0* 36.0*  MCV 103.3*  --   PLT 134*  --     Basic Metabolic Panel: Recent Labs  Lab 01/01/23 1234 01/01/23 1236  NA 140 142  K 3.5 3.6  CL 107 104  CO2 21*  --   GLUCOSE 129* 125*  BUN 19 22  CREATININE 0.82 0.70  CALCIUM 8.7*  --     GFR: Estimated Creatinine Clearance: 84.7 mL/min (by C-G formula based on SCr of 0.7 mg/dL).  Liver Function Tests: Recent Labs  Lab 01/01/23 1234  AST 31  ALT 33  ALKPHOS 46  BILITOT 0.7  PROT 5.8*  ALBUMIN 3.7    Urine analysis:    Component Value Date/Time   COLORURINE YELLOW 09/30/2021 0746   APPEARANCEUR CLEAR 09/30/2021 0746   LABSPEC 1.025 09/30/2021 0746   PHURINE 7.0 09/30/2021 0746    GLUCOSEU NEGATIVE 09/30/2021 0746   HGBUR TRACE (A) 09/30/2021 0746   HGBUR negative 08/06/2008 1013   BILIRUBINUR NEGATIVE 09/30/2021 0746   KETONESUR NEGATIVE 09/30/2021 0746   PROTEINUR NEGATIVE 09/30/2021 0746   UROBILINOGEN 0.2 08/06/2008 1013   NITRITE NEGATIVE 09/30/2021 0746   LEUKOCYTESUR NEGATIVE 09/30/2021 0746    Radiological Exams on Admission: CT HEAD CODE STROKE WO CONTRAST  Result Date: 01/01/2023 CLINICAL DATA:  Code stroke.  Neuro deficit, acute, stroke suspected EXAM: CT HEAD WITHOUT CONTRAST TECHNIQUE: Contiguous axial images were obtained from the base of the skull through the vertex without intravenous contrast. RADIATION DOSE REDUCTION: This exam was performed according to the departmental dose-optimization program which includes automated exposure control, adjustment of the mA and/or kV according to patient size and/or use of iterative reconstruction technique. COMPARISON:  CT head 10/10/2021. FINDINGS: Brain: No evidence of acute infarction, hemorrhage, hydrocephalus, extra-axial collection or mass lesion/mass effect. Vascular: No hyperdense vessel identified. Skull: No acute fracture. Sinuses/Orbits: Clear sinuses.  No acute orbital findings. Other: No mastoid effusions. ASPECTS [facility] Stroke Program Early CT Score) total score (0-10 with 10 being normal): 10. IMPRESSION: 1. No evidence of acute  intracranial abnormality. 2. ASPECTS is 10. Electronically Signed   By: Margaretha Sheffield M.D.   On: 01/01/2023 12:53    EKG: Independently reviewed.  Sinus rhythm at 54 bpm.  Nonspecific T wave flattening.  Assessment/Plan Active Problems:   Gout, unspecified   HYPERTENSION, BENIGN SYSTEMIC   BPH (benign prostatic hyperplasia)   PAF (paroxysmal atrial fibrillation) (HCC)   Hyperlipidemia   TIA (transient ischemic attack)   Bronchiectasis without acute exacerbation (HCC)   CAD (coronary artery disease)   TIA Rule out CVA > Patient presenting as a code stroke with  onset of unsteady gait left-sided weakness at around 1145 this morning.  Symptoms improved by the time he arrived to the ED with neurologist on arrival suspecting TIA.  TIA workup recommended. > CT head in the ED without acute abnormality. - Appreciate neurology recommendations - Allow for permissive HTN (systolic < [id number] and diastolic < 123456) until CVA ruled out - Continue home aspirin, Eliquis - Continue home statin - Echocardiogram  - CTA head & neck  - A1C  - Lipid panel  - Tele monitoring  - SLP eval - PT/OT  Atrial fibrillation > Currently in sinus rhythm - Continue home Eliquis  Hypertension - Holding home antihypertensives in the setting of permissive hypertension for now as above  Hyperlipidemia - Continue home atorvastatin  CAD - Continue home aspirin, atorvastatin - Holding home losartan, Imdur in the setting of permissive hypertension as above  Memory loss - Continue home donepezil and Namenda  DVT prophylaxis: Eliquis Code Status:   Full Family Communication:  None on admission. He was updating daughter by phone His rhythm.  EDP spoke with wife.  Disposition Plan:   Patient is from:  Home  Anticipated DC to:  Home  Anticipated DC date:  1 to 2 days  Anticipated DC barriers: None  Consults called:  Neurology Admission status:  Observation, telemetry  Severity of Illness: The appropriate patient status for this patient is OBSERVATION. Observation status is judged to be reasonable and necessary in order to provide the required intensity of service to ensure the patient's safety. The patient's presenting symptoms, physical exam findings, and initial radiographic and laboratory data in the context of their medical condition is felt to place them at decreased risk for further clinical deterioration. Furthermore, it is anticipated that the patient will be medically stable for discharge from the hospital within 2 midnights of admission.    Marcelyn Bruins  MD [facility]  How to contact the [facility] Attending or Consulting provider [place] or covering provider during after hours [place], for this patient?   Check the care team in [facility] and look for a) attending/consulting TRH provider listed and b) the [facility] team listed Log into [email or web address] and use [place]'s universal password to access. If you do not have the password, please contact the hospital operator. Locate the [facility] provider you are looking for under [facility] and page to a number that you can be directly reached. If you still have difficulty reaching the provider, please page the [facility] (Director on Call) for the Hospitalists listed on amion for assistance.  01/01/2023, 3:25 PM

## 2023-01-01 NOTE — Code Documentation (Signed)
Mr. Jose [name] is an 87 yr old male arriving to [facility] via EMS on 01/01/2023 with a PMH of AF, CAD and HTN. He is on Eliquis. Pt is from home where he was LKW at 1145. Shortly after that time, he said his left side felt "dead" hand he had to sit down.     Stroke team at bridge for pt's arrival. Labs, CBG obtained. Airway cleared by EDP. Pt to CT with team. NIHSS 1: pt not able to state the current month. The following imaging was obtained: CT head. Per Dr Cheral Marker, CT is negative for acute hemorrhage.     Pt back to room 2 where his workup will continue. He will need q 2 hr VS and NIHSS for 12 hrs, then q 4. Stroke swallow screen before any po's. Pt is ineligible for thrombolytic as on Eliquis. He is not a candidate for thrombectomy due to no LVO symptoms. Bedside handoff with ED RN Richardson Landry.

## 2023-01-02 ENCOUNTER — Observation Stay (HOSPITAL_BASED_OUTPATIENT_CLINIC_OR_DEPARTMENT_OTHER): Payer: Medicare Other

## 2023-01-02 ENCOUNTER — Observation Stay (HOSPITAL_COMMUNITY): Payer: Medicare Other

## 2023-01-02 DIAGNOSIS — G459 Transient cerebral ischemic attack, unspecified: Secondary | ICD-10-CM | POA: Diagnosis not present

## 2023-01-02 DIAGNOSIS — R2990 Unspecified symptoms and signs involving the nervous system: Secondary | ICD-10-CM | POA: Diagnosis not present

## 2023-01-02 DIAGNOSIS — I951 Orthostatic hypotension: Secondary | ICD-10-CM

## 2023-01-02 DIAGNOSIS — I480 Paroxysmal atrial fibrillation: Secondary | ICD-10-CM

## 2023-01-02 LAB — ECHOCARDIOGRAM COMPLETE
AR max vel: 2.71 cm2
AV Area VTI: 2.58 cm2
AV Area mean vel: 2.61 cm2
AV Mean grad: 4 mmHg
AV Peak grad: 7.6 mmHg
Ao pk vel: 1.38 m/s
Area-P 1/2: 2.56 cm2
Height: 74 in
S' Lateral: 3.7 cm
Weight: 3604.96 oz

## 2023-01-02 LAB — COMPREHENSIVE METABOLIC PANEL
ALT: 34 U/L (ref 0–44)
AST: 35 U/L (ref 15–41)
Albumin: 3.9 g/dL (ref 3.5–5.0)
Alkaline Phosphatase: 50 U/L (ref 38–126)
Anion gap: 10 (ref 5–15)
BUN: 14 mg/dL (ref 8–23)
CO2: 25 mmol/L (ref 22–32)
Calcium: 8.7 mg/dL — ABNORMAL LOW (ref 8.9–10.3)
Chloride: 105 mmol/L (ref 98–111)
Creatinine, Ser: 0.75 mg/dL (ref 0.61–1.24)
GFR, Estimated: >60 mL/min (ref >60)
Glucose, Bld: 106 mg/dL — ABNORMAL HIGH (ref 70–99)
Potassium: 3.4 mmol/L — ABNORMAL LOW (ref 3.5–5.1)
Sodium: 140 mmol/L (ref 135–145)
Total Bilirubin: 0.9 mg/dL (ref 0.3–1.2)
Total Protein: 6.2 g/dL — ABNORMAL LOW (ref 6.5–8.1)

## 2023-01-02 LAB — CBC
HCT: 38.7 % — ABNORMAL LOW (ref 39.0–52.0)
Hemoglobin: 13.1 g/dL (ref 13.0–17.0)
MCH: 33.1 pg (ref 26.0–34.0)
MCHC: 33.9 g/dL (ref 30.0–36.0)
MCV: 97.7 fL (ref 80.0–100.0)
Platelets: 148 10*3/uL — ABNORMAL LOW (ref 150–400)
RBC: 3.96 MIL/uL — ABNORMAL LOW (ref 4.22–5.81)
RDW: 13.3 % (ref 11.5–15.5)
WBC: 6.5 10*3/uL (ref 4.0–10.5)
nRBC: 0 % (ref 0.0–0.2)

## 2023-01-02 LAB — LIPID PANEL
Cholesterol: 127 mg/dL (ref 0–200)
HDL: 43 mg/dL (ref >40)
LDL Cholesterol: 57 mg/dL (ref 0–99)
Total CHOL/HDL Ratio: 3 RATIO
Triglycerides: 134 mg/dL (ref <150)
VLDL: 27 mg/dL (ref 0–40)

## 2023-01-02 MED ORDER — ATORVASTATIN CALCIUM 40 MG PO TABS: 40.0000 mg | ORAL_TABLET | Freq: Every day | ORAL | Status: DC

## 2023-01-02 NOTE — Progress Notes (Signed)
EMMI: [email or web address]

## 2023-01-02 NOTE — TOC Transition Note (Signed)
Transition of Care [facility]) - CM/SW Discharge Note   Patient Details  Name: Jose [name]. MRN: [id number] Date of Birth: December 01, 1935  [facility] of [facility]) CM/SW Contact:  Pollie Friar, RN Phone Number: 01/02/2023, 3:05 PM   Clinical Narrative:    Pt is discharging home with self care. No f/u per PT/OT and no DME needs.  Pt has transportation home.   Final next level of care: Home/Self Care Barriers to Discharge: No Barriers Identified   Patient Goals and CMS Choice      Discharge Placement                         Discharge Plan and Services Additional resources added to the After Visit Summary for                                       Social Determinants of Health (SDOH) Interventions SDOH Screenings   Food Insecurity: No Food Insecurity (01/01/2023)  Housing: Low Risk  (01/01/2023)  Transportation Needs: No Transportation Needs (01/01/2023)  Utilities: Not At Risk (01/01/2023)  Depression (PHQ2-9): Low Risk  (04/18/2021)  Tobacco Use: Medium Risk (01/01/2023)     Readmission Risk Interventions     No data to display

## 2023-01-02 NOTE — Progress Notes (Signed)
STROKE TEAM PROGRESS NOTE   SUBJECTIVE (INTERVAL HISTORY) His wife is at the bedside.  Overall his condition is completely resolved.  Per patient and wife, patient has baseline dizziness spells if getting up quick and he normally needs to sit for a while after getting up from bed before getting up to walk. Yesterday after he went to bathroom, wife found him sitting in the toilet holding head complaining of feeling woozy. When he tried to get up, he felt left leg and foot did not work as good as right leg. Wife did not notice any facial droop. Currently symptoms all resolved and pt compliant with eliquis and ASA at home.    OBJECTIVE Temp:  [97.3 F (36.3 C)-99 F (37.2 C)] 97.3 F (36.3 C) (03/06 1524) Pulse Rate:  [52-68] 62 (03/06 1524) Cardiac Rhythm: Heart block;Bundle branch block (03/06 0804) Resp:  [14-19] 19 (03/06 1524) BP: (125-154)/(50-100) 154/100 (03/06 1524) SpO2:  [93 %-97 %] 97 % (03/06 1524) Weight:  [102.2 kg] 102.2 kg (03/05 1957)  Recent Labs  Lab 01/01/23 1232  GLUCAP 126*   Recent Labs  Lab 01/01/23 1234 01/01/23 1236 01/02/23 0738  NA 140 142 140  K 3.5 3.6 3.4*  CL 107 104 105  CO2 21*  --  25  GLUCOSE 129* 125* 106*  BUN '19 22 14  '$ CREATININE 0.82 0.70 0.75  CALCIUM 8.7*  --  8.7*   Recent Labs  Lab 01/01/23 1234 01/02/23 0738  AST 31 35  ALT 33 34  ALKPHOS 46 50  BILITOT 0.7 0.9  PROT 5.8* 6.2*  ALBUMIN 3.7 3.9   Recent Labs  Lab 01/01/23 1234 01/01/23 1236 01/02/23 0738  WBC 5.3  --  6.5  NEUTROABS 3.3  --   --   HGB 12.4* 12.2* 13.1  HCT 38.0* 36.0* 38.7*  MCV 103.3*  --  97.7  PLT 134*  --  148*   No results for input(s): "CKTOTAL", "CKMB", "CKMBINDEX", "TROPONINI" in the last 168 hours. Recent Labs    01/01/23 1234  LABPROT 16.9*  INR 1.4*   No results for input(s): "COLORURINE", "LABSPEC", "PHURINE", "GLUCOSEU", "HGBUR", "BILIRUBINUR", "KETONESUR", "PROTEINUR", "UROBILINOGEN", "NITRITE", "LEUKOCYTESUR" in the last 72  hours.  Invalid input(s): "APPERANCEUR"     Component Value Date/Time   CHOL 127 01/02/2023 0738   CHOL 126 04/18/2021 1058   TRIG 134 01/02/2023 0738   HDL 43 01/02/2023 0738   HDL 56 04/18/2021 1058   CHOLHDL 3.0 01/02/2023 0738   VLDL 27 01/02/2023 0738   LDLCALC 57 01/02/2023 0738   LDLCALC 55 04/18/2021 1058   Lab Results  Component Value Date   HGBA1C 5.7 (H) 04/15/2018   No results found for: "LABOPIA", "COCAINSCRNUR", "LABBENZ", "AMPHETMU", "THCU", "LABBARB"  Recent Labs  Lab 01/01/23 1234  [place] <10    I have personally reviewed the radiological images below and agree with the radiology interpretations.  ECHOCARDIOGRAM COMPLETE  Result Date: 01/02/2023    ECHOCARDIOGRAM REPORT   Patient Name:   Jose [name]. Date of Exam: 01/02/2023 Medical Rec #:  [id number]           Height:       74.0 in Accession #:    [id number]          Weight:       225.3 lb Date of Birth:  1936-01-05            BSA:          2.286 m Patient  Age:    87 years            BP:           144/85 mmHg Patient Gender: M                   HR:           55 bpm. Exam Location:  Inpatient Procedure: 2D Echo, Cardiac Doppler and Color Doppler Indications:    TIA G45.9  History:        Patient has prior history of Echocardiogram examinations, most                 recent 04/15/2018. CAD, TIA, Arrythmias:Atrial Fibrillation; Risk                 Factors:Hypertension and Dyslipidemia.  Sonographer:    Ronny Flurry Referring Phys: [id number] [place]  1. Left ventricular ejection fraction, by estimation, is 55%. The left ventricle has low normal function. The left ventricle has no regional wall motion abnormalities. Left ventricular diastolic parameters are consistent with Grade I diastolic dysfunction (impaired relaxation).  2. Right ventricular systolic function is normal. The right ventricular size is normal.  3. Left atrial size was mildly dilated.  4. The mitral valve is normal in structure. No  evidence of mitral valve regurgitation. No evidence of mitral stenosis.  5. The aortic valve is normal in structure. Aortic valve regurgitation is not visualized. No aortic stenosis is present.  6. The inferior vena cava is dilated in size with >50% respiratory variability, suggesting right atrial pressure of 8 mmHg. FINDINGS  Left Ventricle: Left ventricular ejection fraction, by estimation, is 50 to 55%. The left ventricle has low normal function. The left ventricle has no regional wall motion abnormalities. The left ventricular internal cavity size was normal in size. There is no left ventricular hypertrophy. Left ventricular diastolic parameters are consistent with Grade I diastolic dysfunction (impaired relaxation). Right Ventricle: The right ventricular size is normal. No increase in right ventricular wall thickness. Right ventricular systolic function is normal. Left [facility]: Left atrial size was mildly dilated. Right [facility]: Right atrial size was normal in size. Pericardium: There is no evidence of pericardial effusion. Mitral Valve: The mitral valve is normal in structure. No evidence of mitral valve regurgitation. No evidence of mitral valve stenosis. Tricuspid Valve: The tricuspid valve is normal in structure. Tricuspid valve regurgitation is not demonstrated. No evidence of tricuspid stenosis. Aortic Valve: The aortic valve is normal in structure. Aortic valve regurgitation is not visualized. No aortic stenosis is present. Aortic valve mean gradient measures 4.0 mmHg. Aortic valve peak gradient measures 7.6 mmHg. Aortic valve area, by VTI measures 2.58 cm. Pulmonic Valve: The pulmonic valve was normal in structure. Pulmonic valve regurgitation is not visualized. No evidence of pulmonic stenosis. Aorta: The aortic root is normal in size and structure. Venous: The inferior vena cava is dilated in size with greater than 50% respiratory variability, suggesting right atrial pressure of 8 mmHg. IAS/Shunts: No  atrial level shunt detected by color flow Doppler.  LEFT VENTRICLE PLAX 2D LVIDd:         5.10 cm   Diastology LVIDs:         3.70 cm   LV e' medial:    5.43 cm/s LV PW:         1.10 cm   LV E/e' medial:  11.0 LV IVS:        1.10 cm  LV e' lateral:   7.22 cm/s LVOT diam:     2.20 cm   LV E/e' lateral: 8.3 LV SV:         82 LV SV Index:   36 LVOT Area:     3.80 cm  RIGHT VENTRICLE             IVC RV S prime:     11.20 cm/s  IVC diam: 2.00 cm TAPSE (M-mode): 2.2 cm LEFT ATRIUM           Index        RIGHT ATRIUM           Index LA diam:      3.90 cm 1.71 cm/m   RA Area:     16.50 cm LA Vol (A2C): 68.5 ml 29.96 ml/m  RA Volume:   41.50 ml  18.15 ml/m LA Vol (A4C): 90.4 ml 39.54 ml/m  AORTIC VALVE AV Area (Vmax):    2.71 cm AV Area (Vmean):   2.61 cm AV Area (VTI):     2.58 cm AV Vmax:           138.00 cm/s AV Vmean:          93.500 cm/s AV VTI:            0.317 m AV Peak Grad:      7.6 mmHg AV Mean Grad:      4.0 mmHg LVOT Vmax:         98.20 cm/s LVOT Vmean:        64.267 cm/s LVOT VTI:          0.215 m LVOT/AV VTI ratio: 0.68  AORTA Ao Root diam: 3.90 cm Ao Asc diam:  3.40 cm MITRAL VALVE MV Area (PHT): 2.56 cm    SHUNTS MV Decel Time: 296 msec    Systemic VTI:  0.22 m MV E velocity: 59.90 cm/s  Systemic Diam: 2.20 cm MV A velocity: 91.70 cm/s MV E/A ratio:  0.65 Jose [name] Electronically signed by Hebert Soho Signature Date/Time: 01/02/2023/1:34:07 PM    Final    EEG adult  Result Date: 01/02/2023 Lora Havens, MD     01/02/2023 11:41 AM Patient Name: Jose [name]. MRN: [id number] Epilepsy Attending: Lora Havens Referring Physician/Provider: Rosalin Hawking, MD Date: 01/02/2023 Duration: 21.49 mins Patient history: 87 year old male presenting with left facial droop and left sided weakness with numbness. EEG to evaluate for seizure Level of alertness: Awake, asleep AEDs during EEG study: None Technical aspects: This EEG study was done with scalp electrodes positioned according to the  10-20 International system of electrode placement. Electrical activity was reviewed with band pass filter of 1-'70Hz'$ , sensitivity of 7 uV/mm, display speed of 71m/sec with a '60Hz'$  notched filter applied as appropriate. EEG data were recorded continuously and digitally stored.  Video monitoring was available and reviewed as appropriate. Description: No clear posterior dominant rhythm was seen. Sleep was characterized by vertex waves, sleep spindles (12 to 14 Hz), maximal frontocentral region. There is 15 to 18 Hz beta activity distributed symmetrically and diffusely. Hyperventilation and photic stimulation were not performed.   IMPRESSION: This study is within normal limits. No seizures or epileptiform discharges were seen throughout the recording. A normal interictal EEG does not exclude the diagnosis of epilepsy. PLora Havens  CT ANGIO HEAD NECK W WO CM  Result Date: 01/01/2023 CLINICAL DATA:  TIA EXAM: CT ANGIOGRAPHY HEAD AND NECK TECHNIQUE: Multidetector CT imaging  of the head and neck was performed using the standard protocol during bolus administration of intravenous contrast. Multiplanar CT image reconstructions and MIPs were obtained to evaluate the vascular anatomy. Carotid stenosis measurements (when applicable) are obtained utilizing NASCET criteria, using the distal internal carotid diameter as the denominator. RADIATION DOSE REDUCTION: This exam was performed according to the departmental dose-optimization program which includes automated exposure control, adjustment of the mA and/or kV according to patient size and/or use of iterative reconstruction technique. CONTRAST:  6m OMNIPAQUE IOHEXOL 350 MG/ML SOLN COMPARISON:  Same-day noncontrast head CT and brain MRI, MRA head/neck 01/29/2012. FINDINGS: CTA NECK FINDINGS Aortic arch: There is mild calcified plaque in the imaged aortic arch. The origins of the major branch vessels are patent. The subclavian arteries are patent to the level imaged.  Right carotid system: The right common, internal, and external carotid arteries are patent, with mild plaque at the bifurcation but no hemodynamically significant stenosis or occlusion there is no evidence of dissection or aneurysm. Left carotid system: The left common, internal, and external carotid arteries are patent, with mild plaque of the bifurcation but no hemodynamically significant stenosis or occlusion there is no evidence of dissection or aneurysm. Vertebral arteries: The vertebral arteries are patent, without hemodynamically significant stenosis or occlusion, though note that portions of the proximal V2 segments are suboptimally evaluated due to streak artifact from cervical spine fusion hardware. There is no evidence of dissection or aneurysm. Skeleton: There is no acute osseous abnormality or suspicious osseous lesion. Postsurgical changes reflecting C5 through C7 ACDF are noted. There is no visible canal hematoma Other neck: There is a 2.1 cm x 1.5 cm cystic lesion in the subcutaneous fat in the right neck in the posterior triangle (6-69). This is unchanged going back to 2021 suggesting a benign sebaceous cyst. The soft tissues of the neck are otherwise unremarkable. Upper chest: The imaged lung apices are clear. Review of the MIP images confirms the above findings CTA HEAD FINDINGS Anterior circulation: The intracranial ICAs are patent with mild calcified plaque but no significant stenosis or occlusion. The bilateral MCAs are patent, without proximal stenosis or occlusion. The bilateral ACAs are patent, without proximal stenosis or occlusion. The anterior communicating artery is normal. There is no aneurysm or AVM. Posterior circulation: The bilateral V4 segments are patent. The basilar artery is patent. The major cerebellar arteries are patent. The bilateral PCAs are patent, without proximal stenosis or occlusion. There is fetal origin of the right PCA. A left posterior communicating artery is not  definitely seen. There is no aneurysm or AVM. Venous sinuses: Patent. Anatomic variants: As above. Review of the MIP images confirms the above findings IMPRESSION: Patent vasculature of the head and neck with minimal calcified plaque of the carotid bifurcations and siphons but no hemodynamically significant stenosis or occlusion. Electronically Signed   By: PValetta MoleM.D.   On: 01/01/2023 18:40   MR BRAIN WO CONTRAST  Result Date: 01/01/2023 CLINICAL DATA:  Neuro deficit, acute, stroke suspected. Left-sided weakness, facial droop, and unsteady gait. EXAM: MRI HEAD WITHOUT CONTRAST TECHNIQUE: Multiplanar, multiecho pulse sequences of the brain and surrounding structures were obtained without intravenous contrast. COMPARISON:  Head CT 01/01/2023 and MRI 02/22/2016 FINDINGS: Brain: There is no evidence of an acute infarct, mass, midline shift, or extra-axial fluid collection. Superficial siderosis over the left frontal lobe is new from 2017 and consistent with interval but nonacute subarachnoid hemorrhage. Scattered T2 hyperintensities in the cerebral white matter bilaterally are similar to the  prior MRI and are nonspecific but compatible with mild chronic small vessel ischemic disease. Cerebral atrophy is mild for age. Vascular: Major intracranial vascular flow voids are preserved. Skull and upper cervical spine: Unremarkable bone marrow signal. Sinuses/Orbits: Bilateral cataract extraction. Clear paranasal sinuses. Trace left mastoid fluid. Other: None. IMPRESSION: 1. No acute intracranial abnormality. 2. Mild chronic small vessel ischemic disease. 3. 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 Electronically Signed   By: Logan Bores M.D.   On: 01/01/2023 16:29   CT HEAD CODE STROKE WO CONTRAST  Result Date: 01/01/2023 CLINICAL DATA:  Code stroke.  Neuro deficit, acute, stroke suspected EXAM: CT HEAD WITHOUT CONTRAST TECHNIQUE: Contiguous axial images were obtained from the base  of the skull through the vertex without intravenous contrast. RADIATION DOSE REDUCTION: This exam was performed according to the departmental dose-optimization program which includes automated exposure control, adjustment of the mA and/or kV according to patient size and/or use of iterative reconstruction technique. COMPARISON:  CT head 10/10/2021. FINDINGS: Brain: No evidence of acute infarction, hemorrhage, hydrocephalus, extra-axial collection or mass lesion/mass effect. Vascular: No hyperdense vessel identified. Skull: No acute fracture. Sinuses/Orbits: Clear sinuses.  No acute orbital findings. Other: No mastoid effusions. ASPECTS [facility] Stroke Program Early CT Score) total score (0-10 with 10 being normal): 10. IMPRESSION: 1. No evidence of acute intracranial abnormality. 2. ASPECTS is 10. Electronically Signed   By: Margaretha Sheffield M.D.   On: 01/01/2023 12:53     PHYSICAL EXAM  Temp:  [97.3 F (36.3 C)-99 F (37.2 C)] 97.3 F (36.3 C) (03/06 1524) Pulse Rate:  [52-68] 62 (03/06 1524) Resp:  [14-19] 19 (03/06 1524) BP: (125-154)/(50-100) 154/100 (03/06 1524) SpO2:  [93 %-97 %] 97 % (03/06 1524) Weight:  [102.2 kg] 102.2 kg (03/05 1957)  General - Well nourished, well developed, in no apparent distress.  Ophthalmologic - fundi not visualized due to noncooperation.  Cardiovascular - Regular rhythm and rate, not in A-fib.  Mental Status -  Level of arousal and orientation to month, place, and person were intact, but not to time. Language including expression, naming, repetition, comprehension was assessed and found intact.  Cranial Nerves II - XII - II - Visual field intact OU. III, IV, VI - Extraocular movements intact. V - Facial sensation intact bilaterally. VII - Facial movement intact bilaterally. VIII - Hearing & vestibular intact bilaterally. X - Palate elevates symmetrically. XI - Chin turning & shoulder shrug intact bilaterally. XII - Tongue protrusion  intact.  Motor Strength - The patient's strength was normal in all extremities and pronator drift was absent.  Bulk was normal and fasciculations were absent.   Motor Tone - Muscle tone was assessed at the neck and appendages and was normal.  Reflexes - The patient's reflexes were symmetrical in all extremities and he had no pathological reflexes.  Sensory - Light touch, temperature/pinprick were assessed and were symmetrical.    Coordination - The patient had normal movements in the hands and feet with no ataxia or dysmetria.  Tremor was absent.  Gait and Station - deferred.   ASSESSMENT/PLAN Jose [name]. is a 88 y.o. male with history of A-fib on Eliquis, CAD, hypertension, remote seizure, and dementia admitted for dizziness, imbalance, left leg and foot feeling weak. No tPA given due to symptoms resolved.    Concerning for orthostatic hypotension, DDx including TIA.  CT no acute abnormality CT head and neck unremarkable MRI no acute infarct, remote small cortical SAH 2D Echo EF  55% EEG no seizure LDL 57 HgbA1c pending Eliquis for VTE prophylaxis aspirin 81 mg daily and Eliquis (apixaban) daily prior to admission, now on home aspirin 81 mg daily and Eliquis (apixaban) daily.  Patient counseled to be compliant with his antithrombotic medications Ongoing aggressive stroke risk factor management Therapy recommendations: None Disposition: Home today  Orthostatic hypotension Baseline dizziness spells if getting up quick. He normally needs to sit for a while after getting up from bed before getting up to walk. Symptoms of this admission also concerning for orthostatic hypotension Orthostatic vitals: Lying 154/100, sitting 139/101, standing 120/77, standing 3 minutes 110/88. Recommend TED hose knee-high Fall precaution Management per primary team  PAF On Eliquis at home Continue Eliquis on discharge  Hypertension Stable Long term BP goal  normotensive  Hyperlipidemia Home meds: Lipitor 80 LDL 57, goal < 70 Now on Lipitor 80 Continue statin at discharge  Other Stroke Risk Factors Advanced age Coronary artery disease on aspirin History of TIAs  Other Active Problems Remote history of seizure, not on AED now, EEG negative Dementia on Aricept and [facility] day # 0  Neurology will sign off. Please call with questions. Pt will continue follow-up with neurology at [facility] and [facility] as scheduled. Thanks for the consult.   Rosalin Hawking, MD PhD Stroke Neurology 01/02/2023 4:01 PM    To contact Stroke Continuity provider, please refer to [email or web address] After hours, contact General Neurology

## 2023-01-02 NOTE — Evaluation (Signed)
Occupational Therapy Evaluation Patient Details Name: Jose [name]. MRN: [id number] DOB: 10-13-1936 Today's Date: 01/02/2023   History of Present Illness Pt is an 87 y.o. male who presented 01/01/23 with L-sided weakness and numbness. MRI of head negative for acute intracranial abnormality. Likely TIA. PMH: CAD (on ASA), atrial fibrillation (on Eliquis), HTN, TIA, peripheral neuropathy, seizures   Clinical Impression   PTA patient reports independent with ADLs, functional mobility with no AD vs RW or using power chair as needed due to OA. He was admitted for above and presents with problem list below.  He completes bed mobility with independence, transfers and in room mobility with supervision and ADLs with supervision for safety.  Mild unsteadiness and reaching out for UE support, moving slowly and guarded but no LOB. His cognition appears baseline, with pt reporting he has Alzheimer's.  He has good support from spouse at home, who assists with med mgmt and driving.  Based on performance today, believe he will benefit from further OT acutely to optimize independence and safety but anticipate no further needs after dc home.      Recommendations for follow up therapy are one component of a multi-disciplinary discharge planning process, led by the attending physician.  Recommendations may be updated based on patient status, additional functional criteria and insurance authorization.   Follow Up Recommendations  No OT follow up     Assistance Recommended at Discharge Frequent or constant Supervision/Assistance  Patient can return home with the following A little help with walking and/or transfers;A little help with bathing/dressing/bathroom;Direct supervision/assist for medications management;Direct supervision/assist for financial management;Assist for transportation;Help with stairs or ramp for entrance;Assistance with cooking/housework    Functional Status Assessment  Patient has had a  recent decline in their functional status and demonstrates the ability to make significant improvements in function in a reasonable and predictable amount of time.  Equipment Recommendations  None recommended by OT    Recommendations for Other Services       Precautions / Restrictions Precautions Precautions: Fall Restrictions Weight Bearing Restrictions: No      Mobility Bed Mobility Overal bed mobility: Modified Independent                  Transfers Overall transfer level: Needs assistance Equipment used: None Transfers: Sit to/from Stand Sit to Stand: Supervision                  Balance Overall balance assessment: Mild deficits observed, not formally tested (pt reaching out for support as needed, no LOB but slow and guarded)                                         ADL either performed or assessed with clinical judgement   ADL Overall ADL's : Needs assistance/impaired     Grooming: Supervision/safety;Oral care;Standing           Upper Body Dressing : Supervision/safety;Set up;Sitting   Lower Body Dressing: Supervision/safety;Sit to/from stand   Toilet Transfer: Supervision/safety;Ambulation           Functional mobility during ADLs: Supervision/safety;Cueing for safety       Vision   Vision Assessment?: No apparent visual deficits     Perception     Praxis      Pertinent Vitals/Pain Pain Assessment Pain Assessment: Faces Faces Pain Scale: Hurts a little bit Pain Location: low back Pain Descriptors /  Indicators: Discomfort Pain Intervention(s): Monitored during session, Repositioned     Hand Dominance Right   Extremity/Trunk Assessment Upper Extremity Assessment Upper Extremity Assessment: Generalized weakness (noted L thenar eminance atrophy)   Lower Extremity Assessment Lower Extremity Assessment: Defer to PT evaluation   Cervical / Trunk Assessment Cervical / Trunk Assessment: Normal    Communication Communication Communication: HOH   Cognition Arousal/Alertness: Awake/alert Behavior During Therapy: WFL for tasks assessed/performed Overall Cognitive Status: History of cognitive impairments - at baseline                                 General Comments: pt reports hx of alzhiemers. spouse reports baseline.  Pt with decreased recall but follows commands and engages appropratiely.     General Comments  spouse present and supportive    Exercises     Shoulder Instructions      Home Living Family/patient expects to be discharged to:: Private residence Living Arrangements: Spouse/significant other Available Help at Discharge: Family;Available 24 hours/day Type of Home: House Home Access: Stairs to enter [employer] of Steps: 1 Entrance Stairs-Rails: None Home Layout: One level     Bathroom Shower/Tub: Occupational psychologist: Standard Bathroom Accessibility: Yes   Home Equipment: Conservation officer, nature (2 wheels);Wheelchair - power;Other (comment);Shower seat (adjustable bed)          Prior Functioning/Environment Prior Level of Function : Needs assist             Mobility Comments: Uses power w/c in the home due to OA, but is able to walk to mailbox without AD, likes to wear R knee brace. When goes out to eat he uses a RW. ADLs Comments: independnet for ADLs, Wife manages pill box for pt and does the driving        OT Problem List: Decreased activity tolerance;Impaired balance (sitting and/or standing);Decreased safety awareness;Decreased knowledge of use of DME or AE;Decreased knowledge of precautions      OT Treatment/Interventions: Self-care/ADL training;Therapeutic exercise;DME and/or AE instruction;Therapeutic activities;Balance training;Patient/family education    OT Goals(Current goals can be found in the care plan section) Acute Rehab OT Goals Patient Stated Goal: home OT Goal Formulation: With patient Time  For Goal Achievement: 01/16/23 Potential to Achieve Goals: Good  OT Frequency: Min 2X/week    Co-evaluation              AM-PAC OT "6 Clicks" Daily Activity     Outcome Measure Help from another person eating meals?: None Help from another person taking care of personal grooming?: A Little Help from another person toileting, which includes using toliet, bedpan, or urinal?: A Little Help from another person bathing (including washing, rinsing, drying)?: A Little Help from another person to put on and taking off regular upper body clothing?: A Little Help from another person to put on and taking off regular lower body clothing?: A Little 6 Click Score: 19   End of Session Nurse Communication: Mobility status  Activity Tolerance: Patient tolerated treatment well Patient left: in chair;with call bell/phone within reach;with chair alarm set;with family/visitor present  OT Visit Diagnosis: Other abnormalities of gait and mobility (R26.89);Muscle weakness (generalized) (M62.81);History of falling (Z91.81)                Time: [id number] OT Time Calculation (min): 19 min Charges:  OT General Charges $OT Visit: 1 Visit OT Evaluation $OT Eval Low Complexity: 1 Low  Lakesia Dahle B,  OT Acute Rehabilitation Services Office [place] 01/02/2023, 1:37 PM

## 2023-01-02 NOTE — Progress Notes (Signed)
SLP Cancellation Note  Patient Details Name: Jose [name]. MRN: [id number] DOB: [birth year]   Cancelled treatment:       Reason Eval/Treat Not Completed: SLP screened, no needs identified, will sign off Pt did not present with any acute speech, language, or cognitive-linguistic deficits on admission, MRI was negative for acute changes, no overt communication deficits were noted by physical therapy and cognition found reportedly at baseline with documented diagnosis of late onset Alzheimer disease from 2023. A formal evaluation does not appear to be clinically indicated at this time. SLP will sign off.    Arabell Neria I. Hardin Negus, [place], [place] Office number [phone]  Horton Marshall 01/02/2023, 10:25 AM

## 2023-01-02 NOTE — Progress Notes (Signed)
Echocardiogram 2D Echocardiogram has been performed.  Jose [name] 01/02/2023, 1:21 PM

## 2023-01-02 NOTE — Progress Notes (Signed)
EEG complete - results pending 

## 2023-01-02 NOTE — Evaluation (Signed)
Physical Therapy Evaluation & Discharge Patient Details Name: Jose [name]. MRN: [id number] DOB: 01/23/1936 Today's Date: 01/02/2023  History of Present Illness  Pt is an 87 y.o. male who presented 01/01/23 with L-sided weakness and numbness. MRI of head negative for acute intracranial abnormality. Likely TIA. PMH: CAD (on ASA), atrial fibrillation (on Eliquis), HTN, TIA, peripheral neuropathy, seizures   Clinical Impression  Pt presents with condition above. PTA, he was mod I using a power w/c in the home due to OA pain, but was capable of ambulating outside without an AD but would often use a RW for longer distances. Pt lives with his wife in a 1-level house with 1 STE. Pt's wife manages his meds and provides transportation as pt reports he has "Alzheimers". Currently, pt does display deficits in memory and questionable L ankle dorsiflexion weakness and mild L leg incoordination, but he reports all of this is baseline due to his "Alzheimers" or chronic L hip issues etc. He was able to mobilize without assistance and ambulate without an AD without LOB. As pt is likely close to his baseline and reports he is at his baseline, no further PT needs identified. All education completed and questions answered. PT will sign off.      Recommendations for follow up therapy are one component of a multi-disciplinary discharge planning process, led by the attending physician.  Recommendations may be updated based on patient status, additional functional criteria and insurance authorization.  Follow Up Recommendations No PT follow up      Assistance Recommended at Discharge PRN  Patient can return home with the following  Direct supervision/assist for medications management;Direct supervision/assist for financial management;Assist for transportation;Help with stairs or ramp for entrance;Assistance with cooking/housework    Equipment Recommendations None recommended by PT  Recommendations for Other  Services       Functional Status Assessment Patient has not had a recent decline in their functional status     Precautions / Restrictions Precautions Precautions: Fall Restrictions Weight Bearing Restrictions: No      Mobility  Bed Mobility Overal bed mobility: Modified Independent             General bed mobility comments: Pt able to transition supine > sit L EOB with HOB elevated without assistance.    Transfers Overall transfer level: Needs assistance Equipment used: Rolling walker (2 wheels) Transfers: Sit to/from Stand Sit to Stand: Supervision           General transfer comment: No LOB, supervision for safety    Ambulation/Gait Ambulation/Gait assistance: Min guard Gait Distance (Feet): 180 Feet Assistive device: Rolling walker (2 wheels), None Gait Pattern/deviations: Step-through pattern, Decreased stride length Gait velocity: reduced Gait velocity interpretation: 1.31 - 2.62 ft/sec, indicative of limited community ambulator   General Gait Details: Pt with fairly symmetrical short steps bil, ambulating initially with RW but able to progress to no AD without LOB. Min guard assist for safety. Pt needs cues to look up intermittently.  Stairs            Wheelchair Mobility    Modified Rankin (Stroke Patients Only) Modified Rankin (Stroke Patients Only) Pre-Morbid Rankin Score: Slight disability Modified Rankin: Slight disability     Balance Overall balance assessment: Mild deficits observed, not formally tested  Pertinent Vitals/Pain Pain Assessment Pain Assessment: Faces Faces Pain Scale: No hurt Pain Intervention(s): Monitored during session    Home Living Family/patient expects to be discharged to:: Private residence Living Arrangements: Spouse/significant other Available Help at Discharge: Family;Available 24 hours/day Type of Home: House Home Access: Stairs to  enter Entrance Stairs-Rails: None Entrance Stairs-Number of Steps: 1   Home Layout: One level Home Equipment: Conservation officer, nature (2 wheels);Wheelchair - power;Other (comment);Shower seat (adjustable bed)      Prior Function Prior Level of Function : Needs assist             Mobility Comments: Uses power w/c in the home due to OA, but is able to walk to mailbox without AD, likes to wear R knee brace. When goes out to eat he uses a RW. ADLs Comments: Wife manages pill box for pt and does the driving     Hand Dominance   Dominant Hand: Right    Extremity/Trunk Assessment   Upper Extremity Assessment Upper Extremity Assessment: Defer to OT evaluation    Lower Extremity Assessment Lower Extremity Assessment: LLE deficits/detail LLE Deficits / Details: questionable L ankle dorsiflexion weakness with x2 initial MMT trials, scoring 4/5 then with 3rd attempt scored 4+/5 (4+/5 on R), otherwise MMT scores of 5 grossly bil; mild incoordination with rubbing R shin with L heel, but pt reports it is due to L hip issues, able to alternate between tapping PT's 2 feet with L foot with intact coordination; hx of peripheral neuropathy bil, denies any new changes to sensation bil LLE Sensation: history of peripheral neuropathy LLE Coordination: decreased gross motor    Cervical / Trunk Assessment Cervical / Trunk Assessment: Normal  Communication   Communication: HOH  Cognition Arousal/Alertness: Awake/alert Behavior During Therapy: WFL for tasks assessed/performed Overall Cognitive Status: History of cognitive impairments - at baseline                                 General Comments: Slow to recall month and year, stating it is "2022", but otherwise oriented. Reports some mild memory deficits at baseline and reported he has "Alzheimers". Unable to recall how he got the long scar on his L wrist. Likely baseline deficits, but no family present to confirm.        General  Comments      Exercises     Assessment/Plan    PT Assessment Patient does not need any further PT services  PT Problem List Decreased strength;Decreased balance;Decreased activity tolerance;Decreased mobility;Decreased cognition;Decreased coordination;Impaired sensation       PT Treatment Interventions DME instruction;Gait training;Stair training;Functional mobility training;Therapeutic activities;Therapeutic exercise;Balance training;Neuromuscular re-education;Cognitive remediation;Patient/family education    PT Goals (Current goals can be found in the Care Plan section)  Acute Rehab PT Goals Patient Stated Goal: to go home PT Goal Formulation: All assessment and education complete, DC therapy Time For Goal Achievement: 01/03/23 Potential to Achieve Goals: Good    Frequency Min 3X/week     Co-evaluation               AM-PAC PT "6 Clicks" Mobility  Outcome Measure Help needed turning from your back to your side while in a flat bed without using bedrails?: None Help needed moving from lying on your back to sitting on the side of a flat bed without using bedrails?: None Help needed moving to and from a bed to a chair (including a wheelchair)?: A Little Help  needed standing up from a chair using your arms (e.g., wheelchair or bedside chair)?: A Little Help needed to walk in hospital room?: A Little Help needed climbing 3-5 steps with a railing? : A Little 6 Click Score: 20    End of Session Equipment Utilized During Treatment: Gait belt Activity Tolerance: Patient tolerated treatment well Patient left: in chair;with call bell/phone within reach;with chair alarm set   PT Visit Diagnosis: Unsteadiness on feet (R26.81);Other symptoms and signs involving the nervous system (R29.898)    Time: [id number] PT Time Calculation (min) (ACUTE ONLY): 25 min   Charges:   PT Evaluation $PT Eval Low Complexity: 1 Low PT Treatments $Gait Training: 8-22 mins        Moishe Spice, PT, DPT Acute Rehabilitation Services  Office: [place] 01/02/2023, 9:30 AM

## 2023-01-02 NOTE — Progress Notes (Signed)
OT Cancellation Note  Patient Details Name: Jose [name]. MRN: [id number] DOB: 06-06-1936   Cancelled Treatment:    Reason Eval/Treat Not Completed: Patient at procedure or test/ unavailable- EEG in room. Will follow up and see as able.   Jolaine Artist, OT Acute Rehabilitation Services Office [place] 01/02/2023, 10:43 AM

## 2023-01-02 NOTE — Procedures (Signed)
Patient Name: Jose [name].  MRN: [id number]  Epilepsy Attending: Lora Havens  Referring Physician/Provider: Rosalin Hawking, MD  Date: 01/02/2023 Duration: 21.49 mins  Patient history: 87 year old male presenting with left facial droop and left sided weakness with numbness. EEG to evaluate for seizure  Level of alertness: Awake, asleep  AEDs during EEG study: None  Technical aspects: This EEG study was done with scalp electrodes positioned according to the 10-20 International system of electrode placement. Electrical activity was reviewed with band pass filter of 1-'70Hz'$ , sensitivity of 7 uV/mm, display speed of 50m/sec with a '60Hz'$  notched filter applied as appropriate. EEG data were recorded continuously and digitally stored.  Video monitoring was available and reviewed as appropriate.  Description: No clear posterior dominant rhythm was seen. Sleep was characterized by vertex waves, sleep spindles (12 to 14 Hz), maximal frontocentral region. There is 15 to 18 Hz beta activity distributed symmetrically and diffusely. Hyperventilation and photic stimulation were not performed.     IMPRESSION: This study is within normal limits. No seizures or epileptiform discharges were seen throughout the recording.  A normal interictal EEG does not exclude the diagnosis of epilepsy.   [name]

## 2023-01-03 LAB — HEMOGLOBIN A1C
Hgb A1c MFr Bld: 5.9 % — ABNORMAL HIGH (ref 4.8–5.6)
Mean Plasma Glucose: 123 mg/dL

## 2023-01-03 NOTE — Progress Notes (Signed)
Chief Complaint  Patient presents with   Follow-up    CAD, PAF   History of Present Illness: 87 yo male with history of HTN, HLD, paroxysmal atrial fibrillation, TIA, MGUS and CAD here today for cardiac follow up. He had been followed by Dr. Atilano Median in [facility] until June 2019. He was admitted to [facility] in June 2019 with fluttering in his chest with associated chest pain. Troponin was negative. Nuclear stress test with possible ischemia. Cardiac cath June 2019 with mild to moderate non-obstructive disease in the LAD, mild disease in the RCA. Echo 04/15/18 with LVEF=60-65%. No significant valve disease. He was discharged on Imdur. He is on Eliquis for paroxysmal atrial fibrillation. Coronary CTA July 2021 with mild non-obstructive disease in all three major vessels. He was admitted to [facility] on 01/01/23 with left sided weakness and unsteady gait. Head MRI and CT with acute stroke. TIA could not be excluded but his symptoms resolved. Echo 01/02/23 with LVEF=55%. No valve disease. His labetalol and Losartan were stopped. Neck CTA with minimal bilateral carotid artery plaque.   He is here today for follow up. The patient denies any chest pain, dyspnea, palpitations, lower extremity edema, orthopnea, PND, dizziness, near syncope or syncope. He has felt better over the past 24 hours. He has been holding his labetalol and Losartan.   Primary Care Physician: Maximino Greenland  Past Medical History:  Diagnosis Date   Arthritis    CAD (coronary artery disease)    04/15/18 65% dLAD, 20% pLAD, 30% 2nd diag, Normal EF   Dysrhythmia    afib   Gallstones 07/1996   Headache    couple times a year   History of CT scan of head 1997   small vessel disease   History of ETT 05/2002    no EKG changes   History of MRI of cervical spine 07/15/2002   Hypertension    Lumbar spondylolysis 12/17/2016   MGUS (monoclonal gammopathy of unknown significance) 04/11/2012   Occipital neuralgia of left side 12/09/2018    Peripheral neuropathy    Pneumonia 10/2014   PONV (postoperative nausea and vomiting)    once many years ago   Primary localized osteoarthritis of left hip 07/08/2018   Primary localized osteoarthrosis of left shoulder 12/25/2016   Primary localized osteoarthrosis of right shoulder 11/05/2017   Rectal bleeding 12/2002   colonoscopy   Seizures ([place])    last seizure 1976   Skin cancer    Sleep apnea    uses CPAP   Spinal stenosis, lumbar region with neurogenic claudication    Stroke [facility])    ?tia ?afib   TIA (transient ischemic attack)    Ulnar neuropathy at elbow 12/24/2014   Left   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 extremity 03/29/2015    Past Surgical History:  Procedure Laterality Date   ANTERIOR CERVICAL DECOMP/DISCECTOMY FUSION N/A 02/04/2015   Procedure: ANTERIOR CERVICAL DECOMPRESSION/DISCECTOMY FUSION CERVICAL FIVE-SIX,CERVICAL SIX-SEVEN;  Surgeon: Karie Chimera, MD;  Location: [place] [facility];  Service: Neurosurgery;  Laterality: N/A;   arthroscopic knee surgery     BACK SURGERY     CATARACT EXTRACTION Bilateral    COLONOSCOPY W/ POLYPECTOMY  04/19/2003   tubular adenoma   EYE SURGERY     Bilateral Cataracts   GREAT TOE ARTHRODESIS, INTERPHALANGEAL JOINT Left 1978   JOINT REPLACEMENT     joint replacement on right thumb Right    LEFT HEART CATH AND CORONARY ANGIOGRAPHY N/A 04/15/2018   Procedure:  LEFT HEART CATH AND CORONARY ANGIOGRAPHY;  Surgeon: Troy Sine, MD;  Location: [place] [facility];  Service: Cardiovascular;  Laterality: N/A;   LUMBAR LAMINECTOMY/DECOMPRESSION MICRODISCECTOMY N/A 07/19/2017   Procedure: LAMINECTOMY AND FORAMINOTOMY  LUMBAR TWO- LUMBAR THREE, LUMBAR THREE- LUMBAR FOUR;  Surgeon: Ashok Pall, MD;  Location: [place];  Service: Neurosurgery;  Laterality: N/A;   nerve transfer surgery to lwft hand Left    Nov. 2017   SHOULDER ARTHROSCOPY W/ ROTATOR CUFF REPAIR Left 2013   rotator cuff    SHOULDER INJECTION  03/2011   left, [facility]   SKIN  CANCER EXCISION  07/1993   left lateral arm   SUPERFICIAL KERATECTOMY Right 05/1998   wrist   thumb injection  03/2011   left, [place]   TOE SURGERY Right    TONSILLECTOMY AND ADENOIDECTOMY     TOTAL HIP ARTHROPLASTY Left 07/08/2018   TOTAL HIP ARTHROPLASTY Left 07/08/2018   Procedure: LEFT TOTAL HIP ARTHROPLASTY;  Surgeon: Marchia Bond, MD;  Location: [place];  Service: Orthopedics;  Laterality: Left;   TOTAL SHOULDER ARTHROPLASTY Left 12/25/2016   Procedure: TOTAL SHOULDER ARTHROPLASTY;  Surgeon: Marchia Bond, MD;  Location: [place];  Service: Orthopedics;  Laterality: Left;   TOTAL SHOULDER ARTHROPLASTY Right 11/05/2017   TOTAL SHOULDER ARTHROPLASTY Right 11/05/2017   Procedure: TOTAL SHOULDER ARTHROPLASTY;  Surgeon: Marchia Bond, MD;  Location: [place];  Service: Orthopedics;  Laterality: Right;   ulnar nerve transfer  12/1999   left   ULNAR NERVE TRANSPOSITION Left     Current Outpatient Medications  Medication Sig Dispense Refill   acetaminophen (TYLENOL) 500 MG tablet Take 500 mg by mouth 3 (three) times daily.     Aflibercept (EYLEA IZ) 1 Dose by Intravitreal route See admin instructions. 1 injection into each eye every 8-10 weeks.     amLODipine (NORVASC) 5 MG tablet Take 5 mg by mouth daily.     apixaban (ELIQUIS) 5 MG TABS tablet TAKE 1 TABLET TWICE A DAY 180 tablet 0   ASCORBIC ACID PO Take 1 tablet by mouth daily. Vitamin C, unknown strength.     aspirin EC 81 MG tablet Take 81 mg by mouth daily.     atorvastatin (LIPITOR) 80 MG tablet Take 40 mg by mouth daily with supper.     azelastine (ASTELIN) 0.1 % nasal spray Place 1 spray into both nostrils daily. Use in each nostril as directed     Bromfenac Sodium (PROLENSA) 0.07 % SOLN Administer 1 drop into left eye three times daily. 3 mL 0   Clobetasol Propionate 0.05 % shampoo Apply 1 application  topically See admin instructions. Apply topically to scalp 2-3 times a week as needed for severe dry skin.     COCONUT OIL PO  Take 1 capsule by mouth daily with lunch.     donepezil (ARICEPT) 10 MG tablet Take 1 tablet (10 mg total) by mouth at bedtime. 90 tablet 0   fluticasone (FLONASE) 50 MCG/ACT nasal spray Place 2 sprays into both nostrils daily. 16 g 1   isosorbide mononitrate (IMDUR) 30 MG 24 hr tablet TAKE 1 TABLET TWICE A DAY (DOSE INCREASE) 180 tablet 3   memantine (NAMENDA) 5 MG tablet Take 1 tablet by mouth daily X 3 weeks, then take 1 tablet twice a day thereafter 60 tablet 1   Multiple Vitamins-Minerals (PRESERVISION AREDS 2) CAPS Take 1 capsule by mouth 2 (two) times daily.      nitroGLYCERIN (NITROSTAT) 0.4 MG SL tablet PLACE 1  TABLET (0.4 MG TOTAL) UNDER THE TONGUE EVERY 5 (FIVE) MINUTES X 3 DOSES AS NEEDED FOR CHEST PAIN. 75 tablet 2   Polyethyl Glycol-Propyl Glycol (SYSTANE OP) Place 1 drop into both eyes 4 (four) times daily as needed (dry eyes, irritated eyes.).     ROCKLATAN 0.02-0.005 % SOLN Place 1 drop into the right eye at bedtime.     tolterodine (DETROL LA) 2 MG 24 hr capsule Take 2 mg by mouth daily.     No current facility-administered medications for this visit.    Allergies  Allergen Reactions   Maprotiline Other (See Comments)    Tetracyclic antidepressants Pt unaware of this allergy   Sumycin [Tetracycline] Itching and Rash    Social History   Socioeconomic History   Marital status: Married    Spouse name: [name]   Number of children: 2   Years of education: 44   Highest education level: Not on file  Occupational History   Occupation: retired    Fish farm manager: UNEMPLOYED  Tobacco Use   Smoking status: Former    Packs/day: 2.00    Years: 13.00    Total pack years: 26.00    Types: Cigarettes    Quit date: 03/05/1968    Years since quitting: 54.8   Smokeless tobacco: Never  Vaping Use   Vaping Use: Never used  Substance and Sexual Activity   Alcohol use: Yes    Alcohol/week: 2.0 standard drinks of alcohol    Types: 2 Glasses of wine per week    Comment:  social   Drug use: No   Sexual activity: Not Currently  Other Topics Concern   Not on file  Social History Narrative   Lives with 4th wife, [name], married 2010   Retired, Physicist, medical   Son, [place], [name], 2 children   Daughter, [place], in [place], 2 children   Patient is right handed   Patient drinks approximately 3-4 cups of caffeine daily   Social Determinants of Health   Financial Resource Strain: Not on file  Food Insecurity: No Food Insecurity (01/01/2023)   Hunger Vital Sign    Worried About Running Out of Food in the Last Year: Never true    [place] in the Last Year: Never true  Transportation Needs: No Transportation Needs (01/01/2023)   PRAPARE - Hydrologist (Medical): No    Lack of Transportation (Non-Medical): No  Physical Activity: Not on file  Stress: Not on file  Social Connections: Not on file  Intimate Partner Violence: Not At Risk (01/01/2023)   Humiliation, Afraid, Rape, and Kick questionnaire    Fear of Current or Ex-Partner: No    Emotionally Abused: No    Physically Abused: No    Sexually Abused: No    Family History  Problem Relation Age of Onset   Dementia Mother 68       vascular   Transient ischemic attack Mother    Dementia Father        after hip fracture   Hypertension Sister        skin cancer   Seizures Neg Hx    Sleep apnea Neg Hx    Neuropathy Neg Hx     Review of Systems:  As stated in the HPI and otherwise negative.   BP (!) 140/86   Pulse 74   Ht '6\' 2"'$  (1.88 m)   Wt 105.1 kg   SpO2 95%  BMI 29.76 kg/m   Physical Examination: General: Well developed, well nourished, NAD  HEENT: OP clear, mucus membranes moist  SKIN: warm, dry. No rashes. Neuro: No focal deficits  Musculoskeletal: Muscle strength 5/5 all ext  Psychiatric: Mood and affect normal  Neck: No JVD, no carotid bruits, no thyromegaly, no lymphadenopathy.  Lungs:Clear bilaterally,  no wheezes, rhonci, crackles Cardiovascular: Regular rate and rhythm. No murmurs, gallops or rubs. Abdomen:Soft. Bowel sounds present. Non-tender.  Extremities: No lower extremity edema. Pulses are 2 + in the bilateral DP/PT.  EKG:  EKG is ordered today. The ekg ordered today demonstrates sinus, 1st degree AV block.   Echo  01/02/23: 1. Left ventricular ejection fraction, by estimation, is 55%. The left  ventricle has low normal function. The left ventricle has no regional wall  motion abnormalities. Left ventricular diastolic parameters are consistent  with Grade I diastolic  dysfunction (impaired relaxation).   2. Right ventricular systolic function is normal. The right ventricular  size is normal.   3. Left atrial size was mildly dilated.   4. The mitral valve is normal in structure. No evidence of mitral valve  regurgitation. No evidence of mitral stenosis.   5. The aortic valve is normal in structure. Aortic valve regurgitation is  not visualized. No aortic stenosis is present.   6. The inferior vena cava is dilated in size with >50% respiratory  variability, suggesting right atrial pressure of 8 mmHg.   Recent Labs: 01/02/2023: ALT 34; BUN 14; Creatinine, Ser 0.75; Hemoglobin 13.1; Platelets 148; Potassium 3.4; Sodium 140   Lipid Panel    Component Value Date/Time   CHOL 127 01/02/2023 0738   CHOL 126 04/18/2021 1058   TRIG 134 01/02/2023 0738   HDL 43 01/02/2023 0738   HDL 56 04/18/2021 1058   CHOLHDL 3.0 01/02/2023 0738   VLDL 27 01/02/2023 0738   LDLCALC 57 01/02/2023 0738   LDLCALC 55 04/18/2021 1058   LDLDIRECT 120 (H) 08/03/2010 1719     Wt Readings from Last 3 Encounters:  01/04/23 105.1 kg  01/01/23 102.2 kg  12/03/22 104.3 kg    Assessment and Plan:   1. CAD with stable angina: He has mild CAD by cath in 2019 and coronary CTA in 2021. No chest pain suggestive of angina. Continue ASA, statin, Imdur. Beta blocker was stopped this week when he was admitted  with weakness and was hypotensive.   2. Hyperlipidemia: LDL 57 in March 2024. Continue statin.   3. HTN: BP is controlled today off of Losartan and Labetalol. Will continue Norvasc for now. He will follow his BP at home and let us know what it is running over the next week. I would favor restarting Labetalol 50 mg BID if his BP is elevated.    4. Atrial fibrillation, paroxysmal: Sinus today. Continue Eliquis. He is holding his beta blocker due to recent hypotension.   Labs/ tests ordered today include:   Orders Placed This Encounter  Procedures   EKG 12-Lead   Disposition:   F/U with me  in 6  months   Signed, Lauree Chandler, MD 01/04/2023 9:52 AM    [facility] [address], [place], [place]  [address] Phone: [phone]; Fax: [phone]

## 2023-01-04 ENCOUNTER — Ambulatory Visit: Payer: Medicare Other | Attending: Cardiovascular Disease | Admitting: Cardiovascular Disease

## 2023-01-04 ENCOUNTER — Encounter: Payer: Self-pay | Admitting: Cardiovascular Disease

## 2023-01-04 VITALS — BP 140/86 | HR 74 | Ht 74.0 in | Wt 231.8 lb

## 2023-01-04 DIAGNOSIS — I25118 Atherosclerotic heart disease of native coronary artery with other forms of angina pectoris: Secondary | ICD-10-CM | POA: Diagnosis present

## 2023-01-04 DIAGNOSIS — E7800 Pure hypercholesterolemia, unspecified: Secondary | ICD-10-CM | POA: Diagnosis present

## 2023-01-04 DIAGNOSIS — I480 Paroxysmal atrial fibrillation: Secondary | ICD-10-CM | POA: Diagnosis present

## 2023-01-04 DIAGNOSIS — I10 Essential (primary) hypertension: Secondary | ICD-10-CM | POA: Diagnosis present

## 2023-01-04 NOTE — Discharge Summary (Signed)
Physician Discharge Summary   Patient: Jose [name]. MRN: [id number] DOB: 03-Nov-1935  Admit date:     01/01/2023  Discharge date: 01/02/2023  Discharge Physician: Berle Mull  PCP: Debroah Loop, PA-C  Recommendations at discharge: Follow-up with PCP in 1 week.   Follow-up Information     Debroah Loop, PA-C. Schedule an appointment as soon as possible for a visit in 1 week(s).   Contact information: [place] [id number]         Kerin Perna., MD. Go on 05/24/2023.   Specialty: Neurology Contact information: [id number] [place] [address]                Discharge Diagnoses: Orthostatic hypotension.   Gout, unspecified   HYPERTENSION, BENIGN SYSTEMIC   BPH (benign prostatic hyperplasia)   PAF (paroxysmal atrial fibrillation) (HCC)   Hyperlipidemia   TIA (transient ischemic attack)   Bronchiectasis without acute exacerbation (HCC)   CAD (coronary artery disease)  Assessment and Plan  Concern for TIA. Ruled out. Presents with complaints of dizziness lightheadedness and left-sided weakness. Symptoms resolved at time of arrival. CT of the head negative for any acute abnormality. CT angiogram negative for any Large occlusion. MRI brain negative for any acute stroke. Echocardiogram shows preserved EF. EEG shows no evidence of seizures. Patient is able to ambulate with physical therapy. On aspirin and Eliquis which we will continue. PT OT recommended outpatient therapy.  Orthostatic hypotension. Patient does have some dizziness. Blood pressure dropped significantly. Suspect this is probably responsible for patient presentation. Patient although asymptomatic with a significant drop in the blood pressure. Recommend TED hose. Holding antihypertensive regimen for now. Allow higher blood pressure during supine and sitting position as long as asymptomatic.  Paroxysmal A-fib. On  Eliquis. Continue on discharge.  HTN. Holding labetalol and losartan.  HLD. LDL 57. Continue Lipitor.  CAD. On aspirin. Monitor.  History of dementia. On Aricept and Namenda. Will monitor.  Consultants:  Neurology  Procedures performed:  Echocardiogram  DISCHARGE MEDICATION: Allergies as of 01/02/2023       Reactions   Maprotiline Other (See Comments)   Tetracyclic antidepressants Pt unaware of this allergy   Sumycin [tetracycline] Itching, Rash        Medication List     STOP taking these medications    labetalol 200 MG tablet Commonly known as: NORMODYNE   losartan 50 MG tablet Commonly known as: COZAAR       TAKE these medications    acetaminophen 500 MG tablet Commonly known as: TYLENOL Take 500 mg by mouth 3 (three) times daily.   amLODipine 5 MG tablet Commonly known as: NORVASC Take 5 mg by mouth daily.   ASCORBIC ACID PO Take 1 tablet by mouth daily. Vitamin C, unknown strength.   aspirin EC 81 MG tablet Take 81 mg by mouth daily.   atorvastatin 80 MG tablet Commonly known as: LIPITOR Take 40 mg by mouth daily with supper.   azelastine 0.1 % nasal spray Commonly known as: ASTELIN Place 1 spray into both nostrils daily. Use in each nostril as directed   Bromfenac Sodium 0.07 % Soln Commonly known as: Prolensa Administer 1 drop into left eye three times daily.   Clobetasol Propionate 0.05 % shampoo Apply 1 application  topically See admin instructions. Apply topically to scalp 2-3 times a week as needed for severe dry skin.   COCONUT OIL PO Take 1 capsule by mouth  daily with lunch.   donepezil 10 MG tablet Commonly known as: ARICEPT Take 1 tablet (10 mg total) by mouth at bedtime.   Eliquis 5 MG Tabs tablet Generic drug: apixaban TAKE 1 TABLET TWICE A DAY   EYLEA IZ 1 Dose by Intravitreal route See admin instructions. 1 injection into each eye every 8-10 weeks.   fluticasone 50 MCG/ACT nasal spray Commonly known as:  FLONASE Place 2 sprays into both nostrils daily.   isosorbide mononitrate 30 MG 24 hr tablet Commonly known as: IMDUR TAKE 1 TABLET TWICE A DAY (DOSE INCREASE)   memantine 5 MG tablet Commonly known as: NAMENDA Take 1 tablet by mouth daily X 3 weeks, then take 1 tablet twice a day thereafter   nitroGLYCERIN 0.4 MG SL tablet Commonly known as: NITROSTAT PLACE 1 TABLET (0.4 MG TOTAL) UNDER THE TONGUE EVERY 5 (FIVE) MINUTES X 3 DOSES AS NEEDED FOR CHEST PAIN.   PreserVision AREDS 2 Caps Take 1 capsule by mouth 2 (two) times daily.   Rocklatan 0.02-0.005 % Soln Generic drug: Netarsudil-Latanoprost Place 1 drop into the right eye at bedtime.   SYSTANE OP Place 1 drop into both eyes 4 (four) times daily as needed (dry eyes, irritated eyes.).   tolterodine 2 MG 24 hr capsule Commonly known as: DETROL LA Take 2 mg by mouth daily.       Disposition: Home Diet recommendation: Regular diet  Discharge Exam: Vitals:   01/02/23 0314 01/02/23 0751 01/02/23 1139 01/02/23 1524  BP: 130/76 (!) 144/85 (!) 142/82 (!) 154/100  Pulse: 61 64 (!) 52 62  Resp: '16 18 18 19  '$ Temp: 98.2 F (36.8 C) 97.9 F (36.6 C) 98 F (36.7 C) (!) 97.3 F (36.3 C)  TempSrc: Axillary Oral Oral Oral  SpO2: 95% 97% 96% 97%  Weight:      Height:       General: Appear in no distress; no visible Abnormal Neck Mass Or lumps, Conjunctiva normal Cardiovascular: S1 and S2 Present, no Murmur, Respiratory: good respiratory effort, Bilateral Air entry present and CTA, no Crackles, no wheezes Abdomen: Bowel Sound present, Non tender  Extremities: no Pedal edema Neurology: alert and oriented to time, place, and person  Filed Weights   01/01/23 1200 01/01/23 1957  Weight: 105.4 kg 102.2 kg   Condition at discharge: stable  The results of significant diagnostics from this hospitalization (including imaging, microbiology, ancillary and laboratory) are listed below for reference.   Imaging  Studies: ECHOCARDIOGRAM COMPLETE  Result Date: 01/02/2023    ECHOCARDIOGRAM REPORT   Patient Name:   Jose [name]. Date of Exam: 01/02/2023 Medical Rec #:  [id number]           Height:       74.0 in Accession #:    [id number]          Weight:       225.3 lb Date of Birth:  Apr 14, 1936            BSA:          2.286 m Patient Age:    87 years            BP:           144/85 mmHg Patient Gender: M                   HR:           55 bpm. Exam Location:  Inpatient Procedure: 2D Echo, Cardiac Doppler and  Color Doppler Indications:    TIA G45.9  History:        Patient has prior history of Echocardiogram examinations, most                 recent 04/15/2018. CAD, TIA, Arrythmias:Atrial Fibrillation; Risk                 Factors:Hypertension and Dyslipidemia.  Sonographer:    Ronny Flurry Referring Phys: [id number] [place]  1. Left ventricular ejection fraction, by estimation, is 55%. The left ventricle has low normal function. The left ventricle has no regional wall motion abnormalities. Left ventricular diastolic parameters are consistent with Grade I diastolic dysfunction (impaired relaxation).  2. Right ventricular systolic function is normal. The right ventricular size is normal.  3. Left atrial size was mildly dilated.  4. The mitral valve is normal in structure. No evidence of mitral valve regurgitation. No evidence of mitral stenosis.  5. The aortic valve is normal in structure. Aortic valve regurgitation is not visualized. No aortic stenosis is present.  6. The inferior vena cava is dilated in size with >50% respiratory variability, suggesting right atrial pressure of 8 mmHg. FINDINGS  Left Ventricle: Left ventricular ejection fraction, by estimation, is 50 to 55%. The left ventricle has low normal function. The left ventricle has no regional wall motion abnormalities. The left ventricular internal cavity size was normal in size. There is no left ventricular hypertrophy. Left ventricular  diastolic parameters are consistent with Grade I diastolic dysfunction (impaired relaxation). Right Ventricle: The right ventricular size is normal. No increase in right ventricular wall thickness. Right ventricular systolic function is normal. Left Atrium: Left atrial size was mildly dilated. Right Atrium: Right atrial size was normal in size. Pericardium: There is no evidence of pericardial effusion. Mitral Valve: The mitral valve is normal in structure. No evidence of mitral valve regurgitation. No evidence of mitral valve stenosis. Tricuspid Valve: The tricuspid valve is normal in structure. Tricuspid valve regurgitation is not demonstrated. No evidence of tricuspid stenosis. Aortic Valve: The aortic valve is normal in structure. Aortic valve regurgitation is not visualized. No aortic stenosis is present. Aortic valve mean gradient measures 4.0 mmHg. Aortic valve peak gradient measures 7.6 mmHg. Aortic valve area, by VTI measures 2.58 cm. Pulmonic Valve: The pulmonic valve was normal in structure. Pulmonic valve regurgitation is not visualized. No evidence of pulmonic stenosis. Aorta: The aortic root is normal in size and structure. Venous: The inferior vena cava is dilated in size with greater than 50% respiratory variability, suggesting right atrial pressure of 8 mmHg. IAS/Shunts: No atrial level shunt detected by color flow Doppler.  LEFT VENTRICLE PLAX 2D LVIDd:         5.10 cm   Diastology LVIDs:         3.70 cm   LV e' medial:    5.43 cm/s LV PW:         1.10 cm   LV E/e' medial:  11.0 LV IVS:        1.10 cm   LV e' lateral:   7.22 cm/s LVOT diam:     2.20 cm   LV E/e' lateral: 8.3 LV SV:         82 LV SV Index:   36 LVOT Area:     3.80 cm  RIGHT VENTRICLE             IVC RV S prime:     11.20 cm/s  IVC diam:  2.00 cm TAPSE (M-mode): 2.2 cm LEFT ATRIUM           Index        RIGHT ATRIUM           Index LA diam:      3.90 cm 1.71 cm/m   RA Area:     16.50 cm LA Vol (A2C): 68.5 ml 29.96 ml/m  RA Volume:    41.50 ml  18.15 ml/m LA Vol (A4C): 90.4 ml 39.54 ml/m  AORTIC VALVE AV Area (Vmax):    2.71 cm AV Area (Vmean):   2.61 cm AV Area (VTI):     2.58 cm AV Vmax:           138.00 cm/s AV Vmean:          93.500 cm/s AV VTI:            0.317 m AV Peak Grad:      7.6 mmHg AV Mean Grad:      4.0 mmHg LVOT Vmax:         98.20 cm/s LVOT Vmean:        64.267 cm/s LVOT VTI:          0.215 m LVOT/AV VTI ratio: 0.68  AORTA Ao Root diam: 3.90 cm Ao Asc diam:  3.40 cm MITRAL VALVE MV Area (PHT): 2.56 cm    SHUNTS MV Decel Time: 296 msec    Systemic VTI:  0.22 m MV E velocity: 59.90 cm/s  Systemic Diam: 2.20 cm MV A velocity: 91.70 cm/s MV E/A ratio:  0.65 Aditya Sabharwal Electronically signed by Hebert Soho Signature Date/Time: 01/02/2023/1:34:07 PM    Final    EEG adult  Result Date: 01/02/2023 Lora Havens, MD     01/02/2023 11:41 AM Patient Name: Jose [name]. MRN: [id number] Epilepsy Attending: Lora Havens Referring Physician/Provider: Rosalin Hawking, MD Date: 01/02/2023 Duration: 21.49 mins Patient history: 87 year old male presenting with left facial droop and left sided weakness with numbness. EEG to evaluate for seizure Level of alertness: Awake, asleep AEDs during EEG study: None Technical aspects: This EEG study was done with scalp electrodes positioned according to the 10-20 International system of electrode placement. Electrical activity was reviewed with band pass filter of 1-'70Hz'$ , sensitivity of 7 uV/mm, display speed of 5m/sec with a '60Hz'$  notched filter applied as appropriate. EEG data were recorded continuously and digitally stored.  Video monitoring was available and reviewed as appropriate. Description: No clear posterior dominant rhythm was seen. Sleep was characterized by vertex waves, sleep spindles (12 to 14 Hz), maximal frontocentral region. There is 15 to 18 Hz beta activity distributed symmetrically and diffusely. Hyperventilation and photic stimulation were not performed.    IMPRESSION: This study is within normal limits. No seizures or epileptiform discharges were seen throughout the recording. A normal interictal EEG does not exclude the diagnosis of epilepsy. PLora Havens  CT ANGIO HEAD NECK W WO CM  Result Date: 01/01/2023 CLINICAL DATA:  TIA EXAM: CT ANGIOGRAPHY HEAD AND NECK TECHNIQUE: Multidetector CT imaging of the head and neck was performed using the standard protocol during bolus administration of intravenous contrast. Multiplanar CT image reconstructions and MIPs were obtained to evaluate the vascular anatomy. Carotid stenosis measurements (when applicable) are obtained utilizing NASCET criteria, using the distal internal carotid diameter as the denominator. RADIATION DOSE REDUCTION: This exam was performed according to the departmental dose-optimization program which includes automated exposure control, adjustment of the mA and/or kV  according to patient size and/or use of iterative reconstruction technique. CONTRAST:  65m OMNIPAQUE IOHEXOL 350 MG/ML SOLN COMPARISON:  Same-day noncontrast head CT and brain MRI, MRA head/neck 01/29/2012. FINDINGS: CTA NECK FINDINGS Aortic arch: There is mild calcified plaque in the imaged aortic arch. The origins of the major branch vessels are patent. The subclavian arteries are patent to the level imaged. Right carotid system: The right common, internal, and external carotid arteries are patent, with mild plaque at the bifurcation but no hemodynamically significant stenosis or occlusion there is no evidence of dissection or aneurysm. Left carotid system: The left common, internal, and external carotid arteries are patent, with mild plaque of the bifurcation but no hemodynamically significant stenosis or occlusion there is no evidence of dissection or aneurysm. Vertebral arteries: The vertebral arteries are patent, without hemodynamically significant stenosis or occlusion, though note that portions of the proximal V2 segments are  suboptimally evaluated due to streak artifact from cervical spine fusion hardware. There is no evidence of dissection or aneurysm. Skeleton: There is no acute osseous abnormality or suspicious osseous lesion. Postsurgical changes reflecting C5 through C7 ACDF are noted. There is no visible canal hematoma Other neck: There is a 2.1 cm x 1.5 cm cystic lesion in the subcutaneous fat in the right neck in the posterior triangle (6-69). This is unchanged going back to 2021 suggesting a benign sebaceous cyst. The soft tissues of the neck are otherwise unremarkable. Upper chest: The imaged lung apices are clear. Review of the MIP images confirms the above findings CTA HEAD FINDINGS Anterior circulation: The intracranial ICAs are patent with mild calcified plaque but no significant stenosis or occlusion. The bilateral MCAs are patent, without proximal stenosis or occlusion. The bilateral ACAs are patent, without proximal stenosis or occlusion. The anterior communicating artery is normal. There is no aneurysm or AVM. Posterior circulation: The bilateral V4 segments are patent. The basilar artery is patent. The major cerebellar arteries are patent. The bilateral PCAs are patent, without proximal stenosis or occlusion. There is fetal origin of the right PCA. A left posterior communicating artery is not definitely seen. There is no aneurysm or AVM. Venous sinuses: Patent. Anatomic variants: As above. Review of the MIP images confirms the above findings IMPRESSION: Patent vasculature of the head and neck with minimal calcified plaque of the carotid bifurcations and siphons but no hemodynamically significant stenosis or occlusion. Electronically Signed   By: PValetta MoleM.D.   On: 01/01/2023 18:40   MR BRAIN WO CONTRAST  Result Date: 01/01/2023 CLINICAL DATA:  Neuro deficit, acute, stroke suspected. Left-sided weakness, facial droop, and unsteady gait. EXAM: MRI HEAD WITHOUT CONTRAST TECHNIQUE: Multiplanar, multiecho pulse  sequences of the brain and surrounding structures were obtained without intravenous contrast. COMPARISON:  Head CT 01/01/2023 and MRI 02/22/2016 FINDINGS: Brain: There is no evidence of an acute infarct, mass, midline shift, or extra-axial fluid collection. Superficial siderosis over the left frontal lobe is new from 2017 and consistent with interval but nonacute subarachnoid hemorrhage. Scattered T2 hyperintensities in the cerebral white matter bilaterally are similar to the prior MRI and are nonspecific but compatible with mild chronic small vessel ischemic disease. Cerebral atrophy is mild for age. Vascular: Major intracranial vascular flow voids are preserved. Skull and upper cervical spine: Unremarkable bone marrow signal. Sinuses/Orbits: Bilateral cataract extraction. Clear paranasal sinuses. Trace left mastoid fluid. Other: None. IMPRESSION: 1. No acute intracranial abnormality. 2. Mild chronic small vessel ischemic disease. 3. 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 Electronically  Signed   By: Logan Bores M.D.   On: 01/01/2023 16:29   CT HEAD CODE STROKE WO CONTRAST  Result Date: 01/01/2023 CLINICAL DATA:  Code stroke.  Neuro deficit, acute, stroke suspected EXAM: CT HEAD WITHOUT CONTRAST TECHNIQUE: Contiguous axial images were obtained from the base of the skull through the vertex without intravenous contrast. RADIATION DOSE REDUCTION: This exam was performed according to the departmental dose-optimization program which includes automated exposure control, adjustment of the mA and/or kV according to patient size and/or use of iterative reconstruction technique. COMPARISON:  CT head 10/10/2021. FINDINGS: Brain: No evidence of acute infarction, hemorrhage, hydrocephalus, extra-axial collection or mass lesion/mass effect. Vascular: No hyperdense vessel identified. Skull: No acute fracture. Sinuses/Orbits: Clear sinuses.  No acute orbital findings. Other: No mastoid  effusions. ASPECTS [facility] Stroke Program Early CT Score) total score (0-10 with 10 being normal): 10. IMPRESSION: 1. No evidence of acute intracranial abnormality. 2. ASPECTS is 10. Electronically Signed   By: Margaretha Sheffield M.D.   On: 01/01/2023 12:53    Microbiology: Results for orders placed or performed during the hospital encounter of 09/30/21  Urine Culture     Status: None   Collection Time: 09/30/21  7:46 AM   Specimen: In/Out Cath Urine  Result Value Ref Range Status   Specimen Description   Final    IN/OUT CATH URINE Performed at [facility], [address] [place], [place] [address]    Special Requests   Final    NONE Performed at [facility], [address] [place], [place] [address]    Culture   Final    NO GROWTH Performed at [facility], [place] [address] [place], [place] [address]    Report Status 10/01/2021 FINAL  Final  Blood Culture (routine x 2)     Status: None   Collection Time: 09/30/21  7:48 AM   Specimen: BLOOD LEFT HAND  Result Value Ref Range Status   Specimen Description   Final    BLOOD LEFT HAND Performed at [facility], [address] [place], [place] [address]    Special Requests   Final    BOTTLES DRAWN AEROBIC AND ANAEROBIC Blood Culture adequate volume Performed at [facility], [address] [place], [place] [address]    Culture   Final    NO GROWTH 5 DAYS Performed at [facility], [place] [address], [place],  [address]    Report Status 10/05/2021 FINAL  Final  Resp Panel by RT-PCR (Flu A&B, Covid) Nasopharyngeal Swab     Status: Abnormal   Collection Time: 09/30/21  8:04 AM   Specimen: Nasopharyngeal Swab; Nasopharyngeal(NP) swabs in vial transport medium  Result Value Ref Range Status   SARS Coronavirus 2 by RT PCR POSITIVE (A) NEGATIVE Final    Comment: RESULT CALLED TO, READ BACK BY AND VERIFIED WITH: PEGRAM J RN [id number] PHILLIPS C (NOTE) SARS-CoV-2 target  nucleic acids are DETECTED.  The SARS-CoV-2 RNA is generally detectable in upper respiratory specimens during the acute phase of infection. Positive results are indicative of the presence of the identified virus, but do not rule out bacterial infection or co-infection with other pathogens not detected by the test. Clinical correlation with patient history and other diagnostic information is necessary to determine patient infection status. The expected result is Negative.  Fact Sheet for Patients: EntrepreneurPulse.com.au  Fact Sheet for Healthcare Providers: IncredibleEmployment.be  This test  is not yet approved or cleared by the [place] and  has been authorized for detection and/or diagnosis of SARS-CoV-2 by FDA under an Emergency Use Authorization (EUA).  This EUA will remain in effect (meaning this test can b e used) for the duration of  the COVID-19 declaration under Section 564(b)(1) of the Act, 21 U.S.C. section 360bbb-3(b)(1), unless the authorization is terminated or revoked sooner.     Influenza A by PCR NEGATIVE NEGATIVE Final   Influenza B by PCR NEGATIVE NEGATIVE Final    Comment: (NOTE) The Xpert Xpress SARS-CoV-2/FLU/RSV plus assay is intended as an aid in the diagnosis of influenza from Nasopharyngeal swab specimens and should not be used as a sole basis for treatment. Nasal washings and aspirates are unacceptable for Xpert Xpress SARS-CoV-2/FLU/RSV testing.  Fact Sheet for Patients: EntrepreneurPulse.com.au  Fact Sheet for Healthcare Providers: IncredibleEmployment.be  This test is not yet approved or cleared by the [place] FDA and has been authorized for detection and/or diagnosis of SARS-CoV-2 by FDA under an Emergency Use Authorization (EUA). This EUA will remain in effect (meaning this test can be used) for the duration of the COVID-19 declaration under Section  564(b)(1) of the Act, 21 U.S.C. section 360bbb-3(b)(1), unless the authorization is terminated or revoked.  Performed at [facility], [address] [place], [place] [address]   Blood Culture (routine x 2)     Status: None   Collection Time: 09/30/21  8:05 AM   Specimen: BLOOD  Result Value Ref Range Status   Specimen Description   Final    BLOOD RIGHT ANTECUBITAL Performed at [facility], [address] [place], [place] [address]    Special Requests   Final    BOTTLES DRAWN AEROBIC AND ANAEROBIC Blood Culture adequate volume Performed at [facility], [address] [place], [place] [address]    Culture   Final    NO GROWTH 5 DAYS Performed at [facility], [place] [address] [place], [place] [address]    Report Status 10/05/2021 FINAL  Final   Labs: CBC: Recent Labs  Lab 01/01/23 1234 01/01/23 1236 01/02/23 0738  WBC 5.3  --  6.5  NEUTROABS 3.3  --   --   HGB 12.4* 12.2* 13.1  HCT 38.0* 36.0* 38.7*  MCV 103.3*  --  97.7  PLT 134*  --  [id number]*   Basic Metabolic Panel: Recent Labs  Lab 01/01/23 1234 01/01/23 1236 01/02/23 0738  NA 140 142 140  K 3.5 3.6 3.4*  CL 107 104 105  CO2 21*  --  25  GLUCOSE 129* 125* 106*  BUN '19 22 14  '$ CREATININE 0.82 0.70 0.75  CALCIUM 8.7*  --  8.7*   Liver Function Tests: Recent Labs  Lab 01/01/23 1234 01/02/23 0738  AST 31 35  ALT 33 34  ALKPHOS 46 50  BILITOT 0.7 0.9  PROT 5.8* 6.2*  ALBUMIN 3.7 3.9   CBG: Recent Labs  Lab 01/01/23 1232  GLUCAP 126*    Discharge time spent: greater than 30 minutes.  Signed: Berle Mull, MD [facility] 01/02/2023

## 2023-01-04 NOTE — Patient Instructions (Signed)
Monitor blood pressures at home.  Let us know if you are consistently getting readings of higher than 140/90  Medication Instructions:  No changes today. *If you need a refill on your cardiac medications before your next appointment, please call your pharmacy*   Lab Work: none If you have labs (blood work) drawn today and your tests are completely normal, you will receive your results only by: [place] (if you have MyChart) OR A paper copy in the mail If you have any lab test that is abnormal or we need to change your treatment, we will call you to review the results.   Testing/Procedures: none   Follow-Up: At [facility], you and your health needs are our priority.  As part of our continuing mission to provide you with exceptional heart care, we have created designated Provider Care Teams.  These Care Teams include your primary Cardiologist (physician) and Advanced Practice Providers (APPs -  Physician Assistants and Nurse Practitioners) who all work together to provide you with the care you need, when you need it.   Your next appointment:   6 month(s)  Provider:   Lauree Chandler, MD

## 2023-01-11 ENCOUNTER — Other Ambulatory Visit (HOSPITAL_BASED_OUTPATIENT_CLINIC_OR_DEPARTMENT_OTHER): Payer: Self-pay

## 2023-01-11 ENCOUNTER — Other Ambulatory Visit: Payer: Self-pay

## 2023-01-11 ENCOUNTER — Telehealth: Payer: Self-pay | Admitting: Cardiovascular Disease

## 2023-01-11 MED ORDER — LABETALOL HCL 100 MG PO TABS
50.0000 mg | ORAL_TABLET | Freq: Two times a day (BID) | ORAL | 3 refills | Status: AC
  Filled 2023-01-11 (×2): qty 45, 45d supply, fill #0
  Filled 2023-02-11: qty 30, 30d supply, fill #1

## 2023-01-11 NOTE — Telephone Encounter (Signed)
Per Dr Milus Glazier 01/04/23 office visit note  3. HTN: BP is controlled today off of Losartan and Labetalol. Will continue Norvasc for now. He will follow his BP at home and let us know what it is running over the next week. I would favor restarting Labetalol 50 mg BID if his BP is elevated.     Spoke with wife (OK per DPR) Pt's BP has been elevated since recent medication changes.  Today BP is 171/109 and pt is feeling dizzy and the left side of his head feels "funny".  Wife gave him Labetalol 200 mg tab 1/2 tablet this AM.  Advised per Dr Camillia Herter above documentation, pt is to take 50 mg twice a day.  New RX send into pharmacy of choice for 100 mg tablets and pt will take 1/2 tablet BID.  Wife is aware to continue to monitor BP and notify the office if no improvement.  If BP continues to be elevated and dizziness and "funny" feeling in his head continues, they are to report to closest ED for further evaluation and treatment. Will forward to Dr Angelena Form for his knowledge and any further orders.

## 2023-01-11 NOTE — Telephone Encounter (Signed)
STAT if patient feels like he/she is going to faint   Are you dizzy now? Yes   Do you feel faint or have you passed out? No  Do you have any other symptoms? Left side of head feels "funny".   Have you checked your HR and BP (record if available)? 171/109 BP

## 2023-01-14 NOTE — Telephone Encounter (Signed)
Patient's wife returned call. She said there is no improvement w addition of labetalol 50 mg BID and provided the following readings.   When we spoke she did not realize that it had only been   155/90, 123/77      3/12 151/179m 122/80  3/13 149/93 161/90       3/14 174/109, 144/90    3/15  Gave labetalol 100 mg and called MD - instructed to begin labetalol 50 mg BID.  He has taken 2.5 days: No dizzy spells or light headed, "feeling fine"  143/92, 143/95 3/16 147/99 145/96 3/17 157/98, 136/85 today  I asked her to monitor a few more days and I will call her on Thursday for more readings.  Both patient and wife are grateful for assistance.

## 2023-01-14 NOTE — Telephone Encounter (Signed)
Receive a note that was dropped off at front desk with patients name, dob, etc which states blood pressure still high.  No readings on the note.  I called the patient's wife as the note indicated (DPR).  Left message for her to call back.

## 2023-01-17 NOTE — Telephone Encounter (Signed)
Patient's wife returned by phone call. She said his BPs are doing great  3/19 133/90, 143/89 3/20 129/87, 137/89 3/21 127/83  Will continue meds as they are now.  No further concerns.  Will continue to monitor BPs.

## 2023-01-17 NOTE — Telephone Encounter (Signed)
The patient answered and asked that I speak w his wife who is not at home right now.  He will ask her to call back.

## 2023-01-28 ENCOUNTER — Ambulatory Visit: Payer: Medicare Other | Attending: Orthopaedic Surgery | Admitting: Physical Therapy

## 2023-01-28 DIAGNOSIS — M25561 Pain in right knee: Secondary | ICD-10-CM | POA: Diagnosis present

## 2023-01-28 DIAGNOSIS — M6281 Muscle weakness (generalized): Secondary | ICD-10-CM | POA: Diagnosis present

## 2023-01-28 DIAGNOSIS — R262 Difficulty in walking, not elsewhere classified: Secondary | ICD-10-CM | POA: Insufficient documentation

## 2023-01-28 DIAGNOSIS — M25552 Pain in left hip: Secondary | ICD-10-CM | POA: Diagnosis present

## 2023-01-28 DIAGNOSIS — G8929 Other chronic pain: Secondary | ICD-10-CM | POA: Insufficient documentation

## 2023-01-28 DIAGNOSIS — R2681 Unsteadiness on feet: Secondary | ICD-10-CM | POA: Insufficient documentation

## 2023-01-28 NOTE — Therapy (Signed)
OUTPATIENT PHYSICAL THERAPY LOWER EXTREMITY EVALUATION   Patient Name: Jose [name]. MRN: [id number] DOB:18-Apr-1936, 87 y.o., male Today's Date: 01/28/2023  END OF SESSION:  PT End of Session - 01/28/23 1529     Visit Number 1    Number of Visits 16    Date for PT Re-Evaluation 03/25/23    Authorization Type Medicare + [employer]    Progress Note Due on Visit 10    PT Start Time 1520    PT Stop Time [id number]    PT Time Calculation (min) 55 min    Activity Tolerance Patient tolerated treatment well    Behavior During Therapy WFL for tasks assessed/performed             Past Medical History:  Diagnosis Date   Arthritis    CAD (coronary artery disease)    04/15/18 65% dLAD, 20% pLAD, 30% 2nd diag, Normal EF   Dysrhythmia    afib   Gallstones 07/1996   Headache    couple times a year   History of CT scan of head 1997   small vessel disease   History of ETT 05/2002    no EKG changes   History of MRI of cervical spine 07/15/2002   Hypertension    Lumbar spondylolysis 12/17/2016   MGUS (monoclonal gammopathy of unknown significance) 04/11/2012   Occipital neuralgia of left side 12/09/2018   Peripheral neuropathy    Pneumonia 10/2014   PONV (postoperative nausea and vomiting)    once many years ago   Primary localized osteoarthritis of left hip 07/08/2018   Primary localized osteoarthrosis of left shoulder 12/25/2016   Primary localized osteoarthrosis of right shoulder 11/05/2017   Rectal bleeding 12/2002   colonoscopy   Seizures ([place])    last seizure 1976   Skin cancer    Sleep apnea    uses CPAP   Spinal stenosis, lumbar region with neurogenic claudication    Stroke [facility])    ?tia ?afib   TIA (transient ischemic attack)    Ulnar neuropathy at elbow 12/24/2014   Left   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 extremity 03/29/2015   Past Surgical History:  Procedure Laterality Date   ANTERIOR CERVICAL DECOMP/DISCECTOMY FUSION N/A 02/04/2015   Procedure: ANTERIOR CERVICAL  DECOMPRESSION/DISCECTOMY FUSION CERVICAL FIVE-SIX,CERVICAL SIX-SEVEN;  Surgeon: Karie Chimera, MD;  Location: [place] [facility];  Service: Neurosurgery;  Laterality: N/A;   arthroscopic knee surgery     BACK SURGERY     CATARACT EXTRACTION Bilateral    COLONOSCOPY W/ POLYPECTOMY  04/19/2003   tubular adenoma   EYE SURGERY     Bilateral Cataracts   GREAT TOE ARTHRODESIS, INTERPHALANGEAL JOINT Left 1978   JOINT REPLACEMENT     joint replacement on right thumb Right    LEFT HEART CATH AND CORONARY ANGIOGRAPHY N/A 04/15/2018   Procedure: LEFT HEART CATH AND CORONARY ANGIOGRAPHY;  Surgeon: Troy Sine, MD;  Location: [place] [facility];  Service: Cardiovascular;  Laterality: N/A;   LUMBAR LAMINECTOMY/DECOMPRESSION MICRODISCECTOMY N/A 07/19/2017   Procedure: LAMINECTOMY AND FORAMINOTOMY  LUMBAR TWO- LUMBAR THREE, LUMBAR THREE- LUMBAR FOUR;  Surgeon: Ashok Pall, MD;  Location: [place];  Service: Neurosurgery;  Laterality: N/A;   nerve transfer surgery to lwft hand Left    Nov. 2017   SHOULDER ARTHROSCOPY W/ ROTATOR CUFF REPAIR Left 2013   rotator cuff    SHOULDER INJECTION  03/2011   left, [place]   SKIN CANCER EXCISION  07/1993   left lateral  arm   SUPERFICIAL KERATECTOMY Right 05/1998   wrist   thumb injection  03/2011   left, [place]   TOE SURGERY Right    TONSILLECTOMY AND ADENOIDECTOMY     TOTAL HIP ARTHROPLASTY Left 07/08/2018   TOTAL HIP ARTHROPLASTY Left 07/08/2018   Procedure: LEFT TOTAL HIP ARTHROPLASTY;  Surgeon: Marchia Bond, MD;  Location: [place];  Service: Orthopedics;  Laterality: Left;   TOTAL SHOULDER ARTHROPLASTY Left 12/25/2016   Procedure: TOTAL SHOULDER ARTHROPLASTY;  Surgeon: Marchia Bond, MD;  Location: [place];  Service: Orthopedics;  Laterality: Left;   TOTAL SHOULDER ARTHROPLASTY Right 11/05/2017   TOTAL SHOULDER ARTHROPLASTY Right 11/05/2017   Procedure: TOTAL SHOULDER ARTHROPLASTY;  Surgeon: Marchia Bond, MD;  Location: [place];  Service: Orthopedics;   Laterality: Right;   ulnar nerve transfer  12/1999   left   ULNAR NERVE TRANSPOSITION Left    Patient Active Problem List   Diagnosis Date Noted   Memory loss 04/18/2021   Occipital neuralgia of left side 12/09/2018   Primary localized osteoarthritis of left hip 07/08/2018   Primary localized osteoarthritis of hip 07/08/2018   CAD (coronary artery disease) 04/16/2018   Primary localized osteoarthrosis of right shoulder 11/05/2017   Primary localized osteoarthrosis of shoulder 11/05/2017   Lumbar stenosis with neurogenic claudication 07/19/2017   Primary localized osteoarthrosis of left shoulder 12/25/2016   S/P shoulder replacement 12/25/2016   Lumbar spondylolysis 12/17/2016   Paresthesia 01/24/2016   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 extremity 03/29/2015   Cervical spinal stenosis 02/04/2015   Bronchiectasis without acute exacerbation 01/11/2015   TIA (transient ischemic attack) 12/15/2014   Hyperlipidemia 11/24/2014   PAF (paroxysmal atrial fibrillation)    MGUS (monoclonal gammopathy of unknown significance) 04/11/2012   Gout, unspecified 10/04/2009   BPH (benign prostatic hyperplasia) 12/14/2008   ALLERGIC CONJUNCTIVITIS 12/03/2007   TESTOSTERONE DEFICIENCY 11/14/2007   Thoracic back pain 04/22/2007   OBESITY, NOS 12/26/2006   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   THERAPY DIAG:  Unsteadiness on feet  Muscle weakness (generalized)  Pain in left hip  Chronic pain of right knee  Difficulty in walking, not elsewhere classified  Rationale for Evaluation and Treatment:  Rehabilitation  ONSET DATE: ~ 1 year  SUBJECTIVE:   SUBJECTIVE STATEMENT: "Not sure why the foot doctor Sent me to PT, I can walk just fine"  He reports he had fusion of R toe so walks flat footed, he uses a walker out in community but not at home, R knee doesn't work so good so wears a brace now.  Denies any heel pain, just has pain in great toe where he had the fusion.  Thinks he will probably have a knee replacement at some point on Right.  He misunderstood the orthopedist that his knee was unfixable, so bought a motorized wheelchair even, but with the walker and brace it hurts to walk on but he can walk.   Denies heel pain.  Very confused why his foot doctor sent him here, since the only pain he has is where his foot was fused.    PERTINENT HISTORY: PMHX significant of a-fib, TIA, Alzheimer's. Recent hospitalization,  presented due to left sided weakness.  History of ACDF, back surgery, L great toe arthrodesis, bil TSA, R THA, OA multiple sites.   Per MD notes "Since Discharge he is doing well overall. Wife accompanies him today and aides in hx. He had stopped walking and doing this b.c he thought the ortho.told him if something happened to his rt knee he would down. He became more deconditioned due to this. He is now wearing a knee brace and trying to walk more. Using walking for long distanced. Denies worsening weakness or any current neuro.changes. Wife checking bp , did have fu with cardiology and they want her to reach out with him numbers over the next week or so to see if any medications needs to be restarted. Vitals look good today in office."  PAIN:  Are you having pain? Yes: NPRS scale: 6-7/10 Pain location: L hip Pain description: aches Aggravating factors: walking flat footed Relieving factors:   Are you having pain? Yes: NPRS scale: 5/10 Pain location: R knee Pain description: ache Aggravating factors: walking Relieving factors: knee brace, walker   Are you having pain?  Yes: NPRS scale: 2/10 Pain location: R great toe Pain description: constant low level ache Aggravating factors:  Relieving factors: walks flat footed  PRECAUTIONS: None  WEIGHT BEARING RESTRICTIONS: No  FALLS:  Has patient fallen in last 6 months? No  LIVING ENVIRONMENT: Lives with: lives with their spouse Lives in: House/apartment Stairs: Yes: External: 1 steps; none Has following equipment at home: Environmental consultant - 2 wheeled and Wheelchair (power)  OCCUPATION: retired    PLOF: Independent with household mobility with device  PATIENT GOALS: less pain    OBJECTIVE:   DIAGNOSTIC FINDINGS: 03/28/23 MR Brain IMPRESSION:  Mildly motion degraded exam.   No evidence of acute intracranial abnormality.   Mild chronic small vessel image changes within the cerebral white  matter, slightly progressed from the prior MRI of 02/22/2016.   Small chronic lacunar infarct within the right basal ganglia, new  from the prior MRI.   Mild-to-moderate for age generalized cerebral atrophy.   PATIENT SURVEYS:  LEFS 36/80 = 45% ability   COGNITION: Overall cognitive status: Within functional limits for tasks assessed and History of cognitive impairments - at baseline     SENSATION: Not tested  EDEMA:  Bil +1 ankle edema  MUSCLE LENGTH: Hamstrings: mild tightness bil   POSTURE: forward head and decreased lumbar lordosis  PALPATION: Tenderness over L hip trochanter, L glut medius and piriforms  LOWER EXTREMITY ROM:  tightness L hip flexion, to neutral.  Good hip mobility for ER and IR bil, symmetric.     LOWER EXTREMITY MMT:  MMT Right eval Left eval  Hip flexion 5 5  Hip extension 4 4  Hip abduction 5 5  Hip adduction 5 5  Knee flexion 5 5  Knee extension 5 5  Ankle dorsiflexion 4 4  Ankle plantarflexion 4 4   (Blank rows = not tested)  LOWER EXTREMITY SPECIAL TESTS:  NT  FUNCTIONAL TESTS:  5 times sit to stand: 13.7 seconds - without UE assist.  Table height 20 inches  (to place hips and knees at 90/90) 2 minute walk test: 270' without AD MCTSIB: Condition 1: Avg of 3 trials: 30 sec, Condition 2: Avg of 3 trials: 30 sec, Condition 3: Avg of 3 trials: 30 sec, Condition 4: Avg of 3 trials: 6 sec, and Total Score: 96/120  GAIT: Distance walked: 270' Assistive device utilized: None Level of assistance:  Complete Independence Comments: gait with forward flexed posture, hip and knee flexion throughout gait cycle, landing flat footed, decreased stride length.  Brace on R knee.  Prefers walking with 2WRW for community ambulation.    Gait speed with RW - 0.4 m/s, without AD 0.68 m/s   TODAY'S TREATMENT:                                                                                                                              DATE:   01/28/23 Self Care: Education on safety and orthostatic hypotension, as well as findings and POC.  Discussed orthotics as well.     PATIENT EDUCATION:  Education details: see self care Person educated: Patient Education method: Explanation and Handouts Education comprehension: verbalized understanding  HOME EXERCISE PROGRAM: Access Code: [email or web address] URL: [email or web address] Date: 01/28/2023 Prepared by: Glenetta Hew  Patient Education - Check for Safety - Postural Hypotension  ASSESSMENT:  CLINICAL IMPRESSION: [name] "Rowe Robert" is an 87 y.o. male who was seen today for physical therapy evaluation and treatment for bil LE weakness and pain.  He reports his gait changed since having a fusion in his L foot a year ago, and he has severe OA in his L knee, as a result he is having more R hip pain.  He denies falls, but has been using a walker since it helps decrease pressure on his L knee.  He also recently had a TIA (woke up being unable to walk due to balance) and noted on recent imaging a small chronic lacunar infarct within the right basal ganglia. [name] presents with physical  impairments of impaired activity tolerance, impaired standing balance, impaired ambulation, and decreased safety awareness impacting safe and independent functional mobility. Examination revealed patient is at risk for falls and functional decline as evidenced by the following objective test measures: Gait speed 0.68 m/sec, (24m/sec is needed for community access), mCTSIB: position 1:  30sec, position 2: 30sec, position 3: 30sec, position 4: 6sec (30sec in each position demonstrates equal weighting of balance systems).  [name] will benefit from skilled physical therapy services to help reach the maximal level of functional independence and mobility and decrease pain.  We also discussed orthotics to help with foot pain and improve gait mechanics today.  He did become dizzy after laying down for examination then standing up quickly, so was given handout on orthostatic hypotension as well.   OBJECTIVE IMPAIRMENTS: Abnormal gait, decreased activity tolerance, decreased balance, decreased endurance, decreased mobility, difficulty walking, decreased ROM, decreased strength, increased muscle spasms, impaired flexibility, postural dysfunction, and pain.   ACTIVITY LIMITATIONS: carrying, bending, standing, squatting, stairs, and locomotion level  PARTICIPATION LIMITATIONS: cleaning, community activity, and yard work  PERSONAL FACTORS: Age and 3+ comorbidities: A-fib, TIA, Alzheimer's, history of ACDF, back surgery, L great toe arthrodesis, bil TSA, OA multiple sites.   are also affecting patient's functional outcome.   REHAB POTENTIAL: Good  CLINICAL DECISION MAKING: Evolving/moderate complexity  EVALUATION COMPLEXITY: Moderate   GOALS: Goals reviewed with patient? Yes  SHORT TERM GOALS: Target date: 02/11/2023   Patient will be independent with initial HEP. Baseline:  Goal status: INITIAL  2.  Patient will be educated on strategies to decrease risk of falls.  Baseline: educated and  handout handout provided Goal status: MET   LONG TERM GOALS: Target date: 03/25/2023   Patient will be independent with advanced/ongoing HEP to improve outcomes and carryover.  Baseline:  Goal status: INITIAL  2.  Patient will be able to ambulate 600' without increased R hip pain and with good safety to access community.  Baseline: 270' Goal status: INITIAL  3.   Patient will demonstrate improved functional LE strength as demonstrated by 4+/5 hip and ankle strength. Baseline: see objective Goal status: INITIAL  4.  Patient will demonstrate at least 19/24 on DGI to improve gait stability and reduce risk for falls. Baseline: NT Goal status: INITIAL  5.  Patient will report 9 points improvement on LEFS to demonstrate improved functional ability. Baseline: 36/80 Goal status: INITIAL  6.  Patient will demonstrate gait speed of >0.8 m/s to be a safe community ambulator with decreased risk for recurrent falls.  Baseline: 0.4 with RW, 0.68 m/s without AD Goal status: INITIAL  7.  Patient will report 75% improvement in R hip pain to improve QOL.  Baseline:  Goal status: INITIAL  PLAN:  PT FREQUENCY: 1-2x/week  PT DURATION: 8 weeks  PLANNED INTERVENTIONS: Therapeutic exercises, Therapeutic activity, Neuromuscular re-education, Balance training, Gait training, Patient/Family education, Self Care, Joint mobilization, Stair training, Orthotic/Fit training, Dry Needling, Electrical stimulation, Spinal mobilization, Cryotherapy, Moist heat, Ultrasound, Ionotophoresis 4mg /ml Dexamethasone, Manual therapy, and Re-evaluation  PLAN FOR NEXT SESSION: DGI, HEP for hip strengthening, manual therapy/modalities for R hip/glutes/piriformis   Rennie Natter, PT, DPT  01/28/2023, 5:51 PM

## 2023-02-05 ENCOUNTER — Encounter: Payer: Self-pay | Admitting: Physical Therapy

## 2023-02-05 ENCOUNTER — Ambulatory Visit: Payer: Medicare Other | Admitting: Physical Therapy

## 2023-02-05 DIAGNOSIS — R2681 Unsteadiness on feet: Secondary | ICD-10-CM | POA: Diagnosis not present

## 2023-02-05 DIAGNOSIS — M6281 Muscle weakness (generalized): Secondary | ICD-10-CM

## 2023-02-05 DIAGNOSIS — G8929 Other chronic pain: Secondary | ICD-10-CM

## 2023-02-05 DIAGNOSIS — M25552 Pain in left hip: Secondary | ICD-10-CM

## 2023-02-05 DIAGNOSIS — R262 Difficulty in walking, not elsewhere classified: Secondary | ICD-10-CM

## 2023-02-05 NOTE — Therapy (Signed)
OUTPATIENT PHYSICAL THERAPY TREATMENT   Patient Name: Jose [name]. MRN: [id number] DOB:20-Dec-1935, 87 y.o., male Today's Date: 02/05/2023  END OF SESSION:  PT End of Session - 02/05/23 1700     Visit Number 2    Number of Visits 16    Date for PT Re-Evaluation 03/25/23    Authorization Type Medicare + [employer]    Progress Note Due on Visit 10    PT Start Time 1700    PT Stop Time 1740    PT Time Calculation (min) 40 min    Activity Tolerance Patient tolerated treatment well    Behavior During Therapy WFL for tasks assessed/performed             Past Medical History:  Diagnosis Date   Arthritis    CAD (coronary artery disease)    04/15/18 65% dLAD, 20% pLAD, 30% 2nd diag, Normal EF   Dysrhythmia    afib   Gallstones 07/1996   Headache    couple times a year   History of CT scan of head 1997   small vessel disease   History of ETT 05/2002    no EKG changes   History of MRI of cervical spine 07/15/2002   Hypertension    Lumbar spondylolysis 12/17/2016   MGUS (monoclonal gammopathy of unknown significance) 04/11/2012   Occipital neuralgia of left side 12/09/2018   Peripheral neuropathy    Pneumonia 10/2014   PONV (postoperative nausea and vomiting)    once many years ago   Primary localized osteoarthritis of left hip 07/08/2018   Primary localized osteoarthrosis of left shoulder 12/25/2016   Primary localized osteoarthrosis of right shoulder 11/05/2017   Rectal bleeding 12/2002   colonoscopy   Seizures    last seizure 1976   Skin cancer    Sleep apnea    uses CPAP   Spinal stenosis, lumbar region with neurogenic claudication    Stroke    ?tia ?afib   TIA (transient ischemic attack)    Ulnar neuropathy at elbow 12/24/2014   Left   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 extremity 03/29/2015   Past Surgical History:  Procedure Laterality Date   ANTERIOR CERVICAL DECOMP/DISCECTOMY FUSION N/A 02/04/2015   Procedure: ANTERIOR CERVICAL DECOMPRESSION/DISCECTOMY  FUSION CERVICAL FIVE-SIX,CERVICAL SIX-SEVEN;  Surgeon: Aliene Beams, MD;  Location: [facility];  Service: Neurosurgery;  Laterality: N/A;   arthroscopic knee surgery     BACK SURGERY     CATARACT EXTRACTION Bilateral    COLONOSCOPY W/ POLYPECTOMY  04/19/2003   tubular adenoma   EYE SURGERY     Bilateral Cataracts   GREAT TOE ARTHRODESIS, INTERPHALANGEAL JOINT Left 1978   JOINT REPLACEMENT     joint replacement on right thumb Right    LEFT HEART CATH AND CORONARY ANGIOGRAPHY N/A 04/15/2018   Procedure: LEFT HEART CATH AND CORONARY ANGIOGRAPHY;  Surgeon: Lennette Bihari, MD;  Location: [facility];  Service: Cardiovascular;  Laterality: N/A;   LUMBAR LAMINECTOMY/DECOMPRESSION MICRODISCECTOMY N/A 07/19/2017   Procedure: LAMINECTOMY AND FORAMINOTOMY  LUMBAR TWO- LUMBAR THREE, LUMBAR THREE- LUMBAR FOUR;  Surgeon: Coletta Memos, MD;  Location: [facility];  Service: Neurosurgery;  Laterality: N/A;   nerve transfer surgery to lwft hand Left    Nov. 2017   SHOULDER ARTHROSCOPY W/ ROTATOR CUFF REPAIR Left 2013   rotator cuff    SHOULDER INJECTION  03/2011   left, [facility]   SKIN CANCER EXCISION  07/1993   left lateral arm   SUPERFICIAL  KERATECTOMY Right 05/1998   wrist   thumb injection  03/2011   left, [facility]   TOE SURGERY Right    TONSILLECTOMY AND ADENOIDECTOMY     TOTAL HIP ARTHROPLASTY Left 07/08/2018   TOTAL HIP ARTHROPLASTY Left 07/08/2018   Procedure: LEFT TOTAL HIP ARTHROPLASTY;  Surgeon: Teryl Lucy, MD;  Location: [facility];  Service: Orthopedics;  Laterality: Left;   TOTAL SHOULDER ARTHROPLASTY Left 12/25/2016   Procedure: TOTAL SHOULDER ARTHROPLASTY;  Surgeon: Teryl Lucy, MD;  Location: [facility];  Service: Orthopedics;  Laterality: Left;   TOTAL SHOULDER ARTHROPLASTY Right 11/05/2017   TOTAL SHOULDER ARTHROPLASTY Right 11/05/2017   Procedure: TOTAL SHOULDER ARTHROPLASTY;  Surgeon: Teryl Lucy, MD;  Location: [facility];  Service: Orthopedics;  Laterality: Right;   ulnar nerve  transfer  12/1999   left   ULNAR NERVE TRANSPOSITION Left    Patient Active Problem List   Diagnosis Date Noted   Memory loss 04/18/2021   Occipital neuralgia of left side 12/09/2018   Primary localized osteoarthritis of left hip 07/08/2018   Primary localized osteoarthritis of hip 07/08/2018   CAD (coronary artery disease) 04/16/2018   Primary localized osteoarthrosis of right shoulder 11/05/2017   Primary localized osteoarthrosis of shoulder 11/05/2017   Lumbar stenosis with neurogenic claudication 07/19/2017   Primary localized osteoarthrosis of left shoulder 12/25/2016   S/P shoulder replacement 12/25/2016   Lumbar spondylolysis 12/17/2016   Paresthesia 01/24/2016   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 extremity 03/29/2015   Cervical spinal stenosis 02/04/2015   Bronchiectasis without acute exacerbation 01/11/2015   TIA (transient ischemic attack) 12/15/2014   Hyperlipidemia 11/24/2014   PAF (paroxysmal atrial fibrillation)    MGUS (monoclonal gammopathy of unknown significance) 04/11/2012   Gout, unspecified 10/04/2009   BPH (benign prostatic hyperplasia) 12/14/2008   ALLERGIC CONJUNCTIVITIS 12/03/2007   TESTOSTERONE DEFICIENCY 11/14/2007   Thoracic back pain 04/22/2007   OBESITY, NOS 12/26/2006   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   THERAPY DIAG:  Unsteadiness on feet  Muscle weakness (generalized)  Pain in left hip  Chronic pain of right knee  Difficulty in walking, not elsewhere classified  Rationale for Evaluation and Treatment: Rehabilitation  ONSET DATE: ~ 1  year  SUBJECTIVE:   SUBJECTIVE STATEMENT: "I'm here aren't I?"  Pain is not any worse than usual.  Denies new falls.   PERTINENT HISTORY: PMHX significant of a-fib, TIA, Alzheimer's. Recent hospitalization, presented due to left sided weakness.  History of ACDF, back surgery, L great toe arthrodesis, bil TSA, R THA, OA multiple sites.   Neuropathy bil feet.   Per MD notes "Since Discharge he is doing well overall. Wife accompanies him today and aides in hx. He had stopped walking and doing this b.c he thought the ortho.told him if something happened to his rt knee he would down. He became more deconditioned due to this. He is now wearing a knee brace and trying to walk more. Using walking for long distanced. Denies worsening weakness or any current neuro.changes. Wife checking bp , did have fu with cardiology and they want her to reach out with him numbers over the next week or so  to see if any medications needs to be restarted. Vitals look good today in office."  PAIN:  Are you having pain? Yes: NPRS scale: 6-7/10 Pain location: L hip Pain description: aches Aggravating factors: walking flat footed Relieving factors:   Are you having pain? Yes: NPRS scale: 5/10 Pain location: R knee Pain description: ache Aggravating factors: walking Relieving factors: knee brace, walker   Are you having pain? Yes: NPRS scale: 2/10 Pain location: R great toe Pain description: constant low level ache Aggravating factors:  Relieving factors: walks flat footed  PRECAUTIONS: None  WEIGHT BEARING RESTRICTIONS: No  FALLS:  Has patient fallen in last 6 months? No  LIVING ENVIRONMENT: Lives with: lives with their spouse Lives in: House/apartment Stairs: Yes: External: 1 steps; none Has following equipment at home: Environmental consultantWalker - 2 wheeled and Wheelchair (power)  OCCUPATION: retired    PLOF: Independent with household mobility with device  PATIENT GOALS: less pain    OBJECTIVE:   DIAGNOSTIC  FINDINGS: 03/28/23 MR Brain IMPRESSION:  Mildly motion degraded exam.   No evidence of acute intracranial abnormality.   Mild chronic small vessel image changes within the cerebral white  matter, slightly progressed from the prior MRI of 02/22/2016.   Small chronic lacunar infarct within the right basal ganglia, new  from the prior MRI.   Mild-to-moderate for age generalized cerebral atrophy.   PATIENT SURVEYS:  LEFS 36/80 = 45% ability   COGNITION: Overall cognitive status: Within functional limits for tasks assessed and History of cognitive impairments - at baseline     SENSATION: Not tested  EDEMA:  Bil +1 ankle edema  MUSCLE LENGTH: Hamstrings: mild tightness bil   POSTURE: forward head and decreased lumbar lordosis  PALPATION: Tenderness over L hip trochanter, L glut medius and piriforms  LOWER EXTREMITY ROM:  tightness L hip flexion, to neutral.  Good hip mobility for ER and IR bil, symmetric.     LOWER EXTREMITY MMT:  MMT Right eval Left eval  Hip flexion 5 5  Hip extension 4 4  Hip abduction 5 5  Hip adduction 5 5  Knee flexion 5 5  Knee extension 5 5  Ankle dorsiflexion 4 4  Ankle plantarflexion 4 4   (Blank rows = not tested)  LOWER EXTREMITY SPECIAL TESTS:  NT  FUNCTIONAL TESTS:  5 times sit to stand: 13.7 seconds - without UE assist.  Table height 20 inches (to place hips and knees at 90/90) 2 minute walk test: 270' without AD MCTSIB: Condition 1: Avg of 3 trials: 30 sec, Condition 2: Avg of 3 trials: 30 sec, Condition 3: Avg of 3 trials: 30 sec, Condition 4: Avg of 3 trials: 6 sec, and Total Score: 96/120  DGI 13/24 GAIT: Distance walked: 270' Assistive device utilized: None Level of assistance: Complete Independence Comments: gait with forward flexed posture, hip and knee flexion throughout gait cycle, landing flat footed, decreased stride length.  Brace on R knee.  Prefers walking with 2WRW for community ambulation.    Gait speed with RW -  0.4 m/s, without AD 0.68 m/s   TODAY'S TREATMENT:  DATE:   02/05/23 Therapeutic Exercise: to improve strength and mobility.  Demo, verbal and tactile cues throughout for technique. Nustep L5 x 6 min  - Toe Raises with Counter Support  - 10 reps - Standing Hip Extension with Counter Support  - 10 reps - Standing Hip Abduction with Counter Support  - 10 reps - Seated Hamstring Stretch  - 3 reps - 30 sec  hold - Seated Long Arc Quad  - 10 reps - Seated Quad Set 10 reps - 5 sec  hold - Seated Step overs - 2 sets - 10 reps Therapeutic Activity:  to assess balance DGI   01/28/23 Self Care: Education on safety and orthostatic hypotension, as well as findings and POC.  Discussed orthotics as well.     PATIENT EDUCATION:  Education details: see self care Person educated: Patient Education method: Explanation and Handouts Education comprehension: verbalized understanding  HOME EXERCISE PROGRAM: Access Code: [email or web address] URL: [email or web address] Date: 02/05/2023 Prepared by: Harrie Foreman  Exercises - Toe Raises with Counter Support  - 1 x daily - 7 x weekly - 2 sets - 10 reps - Standing Hip Extension with Counter Support  - 1 x daily - 7 x weekly - 2 sets - 10 reps - Standing Hip Abduction with Counter Support  - 1 x daily - 7 x weekly - 2 sets - 10 reps - Seated Hamstring Stretch  - 1 x daily - 7 x weekly - 1 sets - 3 reps - 30 sec  hold - Seated Long Arc Quad  - 1 x daily - 7 x weekly - 2 sets - 10 reps - Seated Quad Set  - 1 x daily - 7 x weekly - 1 sets - 10 reps - 5 sec  hold - Seated March  - 1 x daily - 7 x weekly - 2 sets - 10 reps  Patient Education - Postural Hypotension - Check for Safety  ASSESSMENT:  CLINICAL IMPRESSION: Theora Master. Reports that he is using his electric wheelchair at home instead of walking to try to  preserve his knee and hip.  Discussed with him and his wife that using the w/c at home would further weaken him and does not seem beneficial.  Tested DGI (without AD) and scored 13/24 placing him at increased risk for falls.  Worked on initial HEP for knee and hip strengthening, fatigued at end of session.  Theora Master. continues to demonstrate potential for improvement and would benefit from continued skilled therapy to address impairments.     OBJECTIVE IMPAIRMENTS: Abnormal gait, decreased activity tolerance, decreased balance, decreased endurance, decreased mobility, difficulty walking, decreased ROM, decreased strength, increased muscle spasms, impaired flexibility, postural dysfunction, and pain.   ACTIVITY LIMITATIONS: carrying, bending, standing, squatting, stairs, and locomotion level  PARTICIPATION LIMITATIONS: cleaning, community activity, and yard work  PERSONAL FACTORS: Age and 3+ comorbidities: A-fib, TIA, Alzheimer's, history of ACDF, back surgery, L great toe arthrodesis, bil TSA, OA multiple sites.   are also affecting patient's functional outcome.   REHAB POTENTIAL: Good  CLINICAL DECISION MAKING: Evolving/moderate complexity  EVALUATION COMPLEXITY: Moderate   GOALS: Goals reviewed with patient? Yes  SHORT TERM GOALS: Target date: 02/11/2023   Patient will be independent with initial HEP. Baseline:  Goal status: IN PROGRESS 02/05/23 given initial HEP  2.  Patient will be educated on strategies to decrease risk of falls.  Baseline: educated and handout handout provided Goal status: MET   LONG TERM GOALS: Target  date: 03/25/2023   Patient will be independent with advanced/ongoing HEP to improve outcomes and carryover.  Baseline:  Goal status: IN PROGRESS  2.  Patient will be able to ambulate 600' without increased R hip pain and with good safety to access community.  Baseline: 270' Goal status: IN PROGRESS  3.   Patient will demonstrate improved functional  LE strength as demonstrated by 4+/5 hip and ankle strength. Baseline: see objective Goal status: IN PROGRESS  4.  Patient will demonstrate at least 19/24 on DGI to improve gait stability and reduce risk for falls. Baseline: 02/05/23 13/24 Goal status: IN PROGRESS  5.  Patient will report 9 points improvement on LEFS to demonstrate improved functional ability. Baseline: 36/80 Goal status: IN PROGRESS  6.  Patient will demonstrate gait speed of >0.8 m/s to be a safe community ambulator with decreased risk for recurrent falls.  Baseline: 0.4 with RW, 0.68 m/s without AD Goal status: IN PROGRESS  7.  Patient will report 75% improvement in R hip pain to improve QOL.  Baseline:  Goal status: IN PROGRESS PLAN:   PT FREQUENCY: 1-2x/week  PT DURATION: 8 weeks  PLANNED INTERVENTIONS: Therapeutic exercises, Therapeutic activity, Neuromuscular re-education, Balance training, Gait training, Patient/Family education, Self Care, Joint mobilization, Stair training, Orthotic/Fit training, Dry Needling, Electrical stimulation, Spinal mobilization, Cryotherapy, Moist heat, Ultrasound, Ionotophoresis 4mg /ml Dexamethasone, Manual therapy, and Re-evaluation  PLAN FOR NEXT SESSION: DGI, HEP for hip strengthening, manual therapy/modalities for R hip/glutes/piriformis   Jena Gauss, PT, DPT  02/05/2023, 6:04 PM

## 2023-02-07 ENCOUNTER — Ambulatory Visit: Payer: Medicare Other | Admitting: Physical Therapy

## 2023-02-07 ENCOUNTER — Encounter: Payer: Self-pay | Admitting: Physical Therapy

## 2023-02-07 DIAGNOSIS — R2681 Unsteadiness on feet: Secondary | ICD-10-CM | POA: Diagnosis not present

## 2023-02-07 DIAGNOSIS — G8929 Other chronic pain: Secondary | ICD-10-CM

## 2023-02-07 DIAGNOSIS — M25552 Pain in left hip: Secondary | ICD-10-CM

## 2023-02-07 DIAGNOSIS — M6281 Muscle weakness (generalized): Secondary | ICD-10-CM

## 2023-02-07 DIAGNOSIS — R262 Difficulty in walking, not elsewhere classified: Secondary | ICD-10-CM

## 2023-02-07 NOTE — Therapy (Signed)
OUTPATIENT PHYSICAL THERAPY TREATMENT   Patient Name: Jose MasterWilliam S Gockley Jr. MRN: [id number] DOB:03/05/1936, 87 y.o., male Today's Date: 02/07/2023  END OF SESSION:  PT End of Session - 02/07/23 1633     Visit Number 4    Number of Visits 16    Date for PT Re-Evaluation 03/25/23    Authorization Type Medicare + [employer]    Progress Note Due on Visit 10    PT Start Time 1616    PT Stop Time 1657    PT Time Calculation (min) 41 min    Activity Tolerance Patient tolerated treatment well    Behavior During Therapy WFL for tasks assessed/performed             Past Medical History:  Diagnosis Date   Arthritis    CAD (coronary artery disease)    04/15/18 65% dLAD, 20% pLAD, 30% 2nd diag, Normal EF   Dysrhythmia    afib   Gallstones 07/1996   Headache    couple times a year   History of CT scan of head 1997   small vessel disease   History of ETT 05/2002    no EKG changes   History of MRI of cervical spine 07/15/2002   Hypertension    Lumbar spondylolysis 12/17/2016   MGUS (monoclonal gammopathy of unknown significance) 04/11/2012   Occipital neuralgia of left side 12/09/2018   Peripheral neuropathy    Pneumonia 10/2014   PONV (postoperative nausea and vomiting)    once many years ago   Primary localized osteoarthritis of left hip 07/08/2018   Primary localized osteoarthrosis of left shoulder 12/25/2016   Primary localized osteoarthrosis of right shoulder 11/05/2017   Rectal bleeding 12/2002   colonoscopy   Seizures    last seizure 1976   Skin cancer    Sleep apnea    uses CPAP   Spinal stenosis, lumbar region with neurogenic claudication    Stroke    ?tia ?afib   TIA (transient ischemic attack)    Ulnar neuropathy at elbow 12/24/2014   Left   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 extremity 03/29/2015   Past Surgical History:  Procedure Laterality Date   ANTERIOR CERVICAL DECOMP/DISCECTOMY FUSION N/A 02/04/2015   Procedure: ANTERIOR CERVICAL DECOMPRESSION/DISCECTOMY  FUSION CERVICAL FIVE-SIX,CERVICAL SIX-SEVEN;  Surgeon: Aliene Beamsandy Kritzer, MD;  Location: [facility];  Service: Neurosurgery;  Laterality: N/A;   arthroscopic knee surgery     BACK SURGERY     CATARACT EXTRACTION Bilateral    COLONOSCOPY W/ POLYPECTOMY  04/19/2003   tubular adenoma   EYE SURGERY     Bilateral Cataracts   GREAT TOE ARTHRODESIS, INTERPHALANGEAL JOINT Left 1978   JOINT REPLACEMENT     joint replacement on right thumb Right    LEFT HEART CATH AND CORONARY ANGIOGRAPHY N/A 04/15/2018   Procedure: LEFT HEART CATH AND CORONARY ANGIOGRAPHY;  Surgeon: Lennette BihariKelly, Thomas A, MD;  Location: [facility];  Service: Cardiovascular;  Laterality: N/A;   LUMBAR LAMINECTOMY/DECOMPRESSION MICRODISCECTOMY N/A 07/19/2017   Procedure: LAMINECTOMY AND FORAMINOTOMY  LUMBAR TWO- LUMBAR THREE, LUMBAR THREE- LUMBAR FOUR;  Surgeon: Coletta Memosabbell, Kyle, MD;  Location: [facility];  Service: Neurosurgery;  Laterality: N/A;   nerve transfer surgery to lwft hand Left    Nov. 2017   SHOULDER ARTHROSCOPY W/ ROTATOR CUFF REPAIR Left 2013   rotator cuff    [facility]   SKIN CANCER EXCISION  07/1993   left lateral arm   SUPERFICIAL  KERATECTOMY Right 05/1998   wrist   thumb injection  03/2011   left, [facility]   TOE SURGERY Right    TONSILLECTOMY AND ADENOIDECTOMY     TOTAL HIP ARTHROPLASTY Left 07/08/2018   TOTAL HIP ARTHROPLASTY Left 07/08/2018   Procedure: LEFT TOTAL HIP ARTHROPLASTY;  Surgeon: Teryl Lucy, MD;  Location: [facility];  Service: Orthopedics;  Laterality: Left;   TOTAL SHOULDER ARTHROPLASTY Left 12/25/2016   Procedure: TOTAL SHOULDER ARTHROPLASTY;  Surgeon: Teryl Lucy, MD;  Location: [facility];  Service: Orthopedics;  Laterality: Left;   TOTAL SHOULDER ARTHROPLASTY Right 11/05/2017   TOTAL SHOULDER ARTHROPLASTY Right 11/05/2017   Procedure: TOTAL SHOULDER ARTHROPLASTY;  Surgeon: Teryl Lucy, MD;  Location: [facility];  Service: Orthopedics;  Laterality: Right;   ulnar nerve  transfer  12/1999   left   ULNAR NERVE TRANSPOSITION Left    Patient Active Problem List   Diagnosis Date Noted   Memory loss 04/18/2021   Occipital neuralgia of left side 12/09/2018   Primary localized osteoarthritis of left hip 07/08/2018   Primary localized osteoarthritis of hip 07/08/2018   CAD (coronary artery disease) 04/16/2018   Primary localized osteoarthrosis of right shoulder 11/05/2017   Primary localized osteoarthrosis of shoulder 11/05/2017   Lumbar stenosis with neurogenic claudication 07/19/2017   Primary localized osteoarthrosis of left shoulder 12/25/2016   S/P shoulder replacement 12/25/2016   Lumbar spondylolysis 12/17/2016   Paresthesia 01/24/2016   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 extremity 03/29/2015   Cervical spinal stenosis 02/04/2015   Bronchiectasis without acute exacerbation 01/11/2015   TIA (transient ischemic attack) 12/15/2014   Hyperlipidemia 11/24/2014   PAF (paroxysmal atrial fibrillation)    MGUS (monoclonal gammopathy of unknown significance) 04/11/2012   Gout, unspecified 10/04/2009   BPH (benign prostatic hyperplasia) 12/14/2008   ALLERGIC CONJUNCTIVITIS 12/03/2007   TESTOSTERONE DEFICIENCY 11/14/2007   Thoracic back pain 04/22/2007   OBESITY, NOS 12/26/2006   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   THERAPY DIAG:  Unsteadiness on feet  Muscle weakness (generalized)  Pain in left hip  Chronic pain of right knee  Difficulty in walking, not elsewhere classified  Rationale for Evaluation and Treatment: Rehabilitation  ONSET DATE: ~ 1  year  SUBJECTIVE:   SUBJECTIVE STATEMENT: Has been walking around at home and doing exercises.  Knee hurts some but not a lot.    PERTINENT HISTORY: PMHX significant of a-fib, TIA, Alzheimer's. Recent hospitalization, presented due to left sided weakness.  History of ACDF, back surgery, L great toe arthrodesis, bil TSA, R THA, OA multiple sites.   Neuropathy bil feet.   Per MD notes "Since Discharge he is doing well overall. Wife accompanies him today and aides in hx. He had stopped walking and doing this b.c he thought the ortho.told him if something happened to his rt knee he would down. He became more deconditioned due to this. He is now wearing a knee brace and trying to walk more. Using walking for long distanced. Denies worsening weakness or any current neuro.changes. Wife checking bp , did have fu with cardiology and they want her to reach out with him numbers over the next week  or so to see if any medications needs to be restarted. Vitals look good today in office."  PAIN:  Are you having pain? Yes: NPRS scale: 2/10 Pain location: L hip Pain description: aches Aggravating factors: walking flat footed Relieving factors:   Are you having pain? Yes: NPRS scale: 3-4/10 Pain location: R knee Pain description: ache Aggravating factors: walking Relieving factors: knee brace, walker   Are you having pain? Yes: NPRS scale: 2/10 Pain location: R great toe Pain description: constant low level ache Aggravating factors:  Relieving factors: walks flat footed  PRECAUTIONS: None  WEIGHT BEARING RESTRICTIONS: No  FALLS:  Has patient fallen in last 6 months? No  LIVING ENVIRONMENT: Lives with: lives with their spouse Lives in: House/apartment Stairs: Yes: External: 1 steps; none Has following equipment at home: Environmental consultant - 2 wheeled and Wheelchair (power)  OCCUPATION: retired    PLOF: Independent with household mobility with device  PATIENT GOALS: less pain    OBJECTIVE:    DIAGNOSTIC FINDINGS: 03/28/23 MR Brain IMPRESSION:  Mildly motion degraded exam.   No evidence of acute intracranial abnormality.   Mild chronic small vessel image changes within the cerebral white  matter, slightly progressed from the prior MRI of 02/22/2016.   Small chronic lacunar infarct within the right basal ganglia, new  from the prior MRI.   Mild-to-moderate for age generalized cerebral atrophy.   PATIENT SURVEYS:  LEFS 36/80 = 45% ability   COGNITION: Overall cognitive status: Within functional limits for tasks assessed and History of cognitive impairments - at baseline     SENSATION: Not tested  EDEMA:  Bil +1 ankle edema  MUSCLE LENGTH: Hamstrings: mild tightness bil   POSTURE: forward head and decreased lumbar lordosis  PALPATION: Tenderness over L hip trochanter, L glut medius and piriforms  LOWER EXTREMITY ROM:  tightness L hip flexion, to neutral.  Good hip mobility for ER and IR bil, symmetric.     LOWER EXTREMITY MMT:  MMT Right eval Left eval  Hip flexion 5 5  Hip extension 4 4  Hip abduction 5 5  Hip adduction 5 5  Knee flexion 5 5  Knee extension 5 5  Ankle dorsiflexion 4 4  Ankle plantarflexion 4 4   (Blank rows = not tested)  LOWER EXTREMITY SPECIAL TESTS:  NT  FUNCTIONAL TESTS:  5 times sit to stand: 13.7 seconds - without UE assist.  Table height 20 inches (to place hips and knees at 90/90) 2 minute walk test: 270' without AD MCTSIB: Condition 1: Avg of 3 trials: 30 sec, Condition 2: Avg of 3 trials: 30 sec, Condition 3: Avg of 3 trials: 30 sec, Condition 4: Avg of 3 trials: 6 sec, and Total Score: 96/120  DGI 13/24 GAIT: Distance walked: 270' Assistive device utilized: None Level of assistance: Complete Independence Comments: gait with forward flexed posture, hip and knee flexion throughout gait cycle, landing flat footed, decreased stride length.  Brace on R knee.  Prefers walking with 2WRW for community ambulation.    Gait  speed with RW - 0.4 m/s, without AD 0.68 m/s   TODAY'S TREATMENT:  DATE:   02/07/23 Therapeutic Exercise: to improve strength and mobility.  Demo, verbal and tactile cues throughout for technique. Nustep L5 x 6 min  LE only  At counter with bil UE support for safety: Standing toe raises 2 x 10 Standing hip extension 2  x 10 bil Standing hip abduction 2 x 10 bil  Standing hamstring curls 2 x 10 bil  Standing marches x 10 bil  Seated ball squeeze x 10 Seated step overs 2 x 10 bil Seated LAQ #2 2 x 10 bil  Seated heel raises 2# 2 x 10  Seated marches 2# x 20     02/05/23 Therapeutic Exercise: to improve strength and mobility.  Demo, verbal and tactile cues throughout for technique. Nustep L5 x 6 min  - Toe Raises with Counter Support  - 10 reps - Standing Hip Extension with Counter Support  - 10 reps - Standing Hip Abduction with Counter Support  - 10 reps - Seated Hamstring Stretch  - 3 reps - 30 sec  hold - Seated Long Arc Quad  - 10 reps - Seated Quad Set 10 reps - 5 sec  hold - Seated Step overs - 2 sets - 10 reps Therapeutic Activity:  to assess balance DGI   01/28/23 Self Care: Education on safety and orthostatic hypotension, as well as findings and POC.  Discussed orthotics as well.     PATIENT EDUCATION:  Education details: see self care Person educated: Patient Education method: Explanation and Handouts Education comprehension: verbalized understanding  HOME EXERCISE PROGRAM: Access Code: [email or web address] URL: [email or web address] Date: 02/05/2023 Prepared by: Harrie Foreman  Exercises - Toe Raises with Counter Support  - 1 x daily - 7 x weekly - 2 sets - 10 reps - Standing Hip Extension with Counter Support  - 1 x daily - 7 x weekly - 2 sets - 10 reps - Standing Hip Abduction with Counter Support  - 1 x daily - 7 x weekly - 2  sets - 10 reps - Seated Hamstring Stretch  - 1 x daily - 7 x weekly - 1 sets - 3 reps - 30 sec  hold - Seated Long Arc Quad  - 1 x daily - 7 x weekly - 2 sets - 10 reps - Seated Quad Set  - 1 x daily - 7 x weekly - 1 sets - 10 reps - 5 sec  hold - Seated March  - 1 x daily - 7 x weekly - 2 sets - 10 reps  Patient Education - Postural Hypotension - Check for Safety  ASSESSMENT:  CLINICAL IMPRESSION: Jose [name]. That he is more active at home how, just continues to wear his knee brace.  No report of increased pain.  He tolerated exercises well today, but needed frequent cues for posture and control, tending to try to use momentum with exercises.  Jose [name]. continues to demonstrate potential for improvement and would benefit from continued skilled therapy to address impairments.     OBJECTIVE IMPAIRMENTS: Abnormal gait, decreased activity tolerance, decreased balance, decreased endurance, decreased mobility, difficulty walking, decreased ROM, decreased strength, increased muscle spasms, impaired flexibility, postural dysfunction, and pain.   ACTIVITY LIMITATIONS: carrying, bending, standing, squatting, stairs, and locomotion level  PARTICIPATION LIMITATIONS: cleaning, community activity, and yard work  PERSONAL FACTORS: Age and 3+ comorbidities: A-fib, TIA, Alzheimer's, history of ACDF, back surgery, L great toe arthrodesis, bil TSA, OA multiple sites.   are also affecting patient's functional outcome.  REHAB POTENTIAL: Good  CLINICAL DECISION MAKING: Evolving/moderate complexity  EVALUATION COMPLEXITY: Moderate   GOALS: Goals reviewed with patient? Yes  SHORT TERM GOALS: Target date: 02/11/2023   Patient will be independent with initial HEP. Baseline:  Goal status: IN PROGRESS 02/05/23 given initial HEP  2.  Patient will be educated on strategies to decrease risk of falls.  Baseline: educated and handout handout provided Goal status: MET   LONG TERM GOALS:  Target date: 03/25/2023   Patient will be independent with advanced/ongoing HEP to improve outcomes and carryover.  Baseline:  Goal status: IN PROGRESS  2.  Patient will be able to ambulate 600' without increased R hip pain and with good safety to access community.  Baseline: 270' Goal status: IN PROGRESS  3.   Patient will demonstrate improved functional LE strength as demonstrated by 4+/5 hip and ankle strength. Baseline: see objective Goal status: IN PROGRESS  4.  Patient will demonstrate at least 19/24 on DGI to improve gait stability and reduce risk for falls. Baseline: 02/05/23 13/24 Goal status: IN PROGRESS  5.  Patient will report 9 points improvement on LEFS to demonstrate improved functional ability. Baseline: 36/80 Goal status: IN PROGRESS  6.  Patient will demonstrate gait speed of >0.8 m/s to be a safe community ambulator with decreased risk for recurrent falls.  Baseline: 0.4 with RW, 0.68 m/s without AD Goal status: IN PROGRESS  7.  Patient will report 75% improvement in R hip pain to improve QOL.  Baseline:  Goal status: IN PROGRESS PLAN:   PT FREQUENCY: 1-2x/week  PT DURATION: 8 weeks  PLANNED INTERVENTIONS: Therapeutic exercises, Therapeutic activity, Neuromuscular re-education, Balance training, Gait training, Patient/Family education, Self Care, Joint mobilization, Stair training, Orthotic/Fit training, Dry Needling, Electrical stimulation, Spinal mobilization, Cryotherapy, Moist heat, Ultrasound, Ionotophoresis 4mg /ml Dexamethasone, Manual therapy, and Re-evaluation  PLAN FOR NEXT SESSION: DGI, HEP for hip strengthening, manual therapy/modalities for R hip/glutes/piriformis   Jena Gauss, PT, DPT  02/07/2023, 4:59 PM

## 2023-02-11 ENCOUNTER — Other Ambulatory Visit (HOSPITAL_BASED_OUTPATIENT_CLINIC_OR_DEPARTMENT_OTHER): Payer: Self-pay

## 2023-02-13 ENCOUNTER — Ambulatory Visit: Payer: Medicare Other

## 2023-02-13 DIAGNOSIS — G8929 Other chronic pain: Secondary | ICD-10-CM

## 2023-02-13 DIAGNOSIS — R2681 Unsteadiness on feet: Secondary | ICD-10-CM | POA: Diagnosis not present

## 2023-02-13 DIAGNOSIS — R262 Difficulty in walking, not elsewhere classified: Secondary | ICD-10-CM

## 2023-02-13 DIAGNOSIS — M6281 Muscle weakness (generalized): Secondary | ICD-10-CM

## 2023-02-13 DIAGNOSIS — M25552 Pain in left hip: Secondary | ICD-10-CM

## 2023-02-13 NOTE — Therapy (Signed)
OUTPATIENT PHYSICAL THERAPY TREATMENT   Patient Name: Jose [name]. MRN: [id number] DOB:15-Sep-1936, 87 y.o., male Today's Date: 02/13/2023  END OF SESSION:  PT End of Session - 02/13/23 1538     Visit Number 4   corrected from last visit   Number of Visits 16    Date for PT Re-Evaluation 03/25/23    Authorization Type Medicare + [employer]    Progress Note Due on Visit 10    PT Start Time 1530    PT Stop Time 1616    PT Time Calculation (min) 46 min    Activity Tolerance Patient tolerated treatment well    Behavior During Therapy WFL for tasks assessed/performed              Past Medical History:  Diagnosis Date   Arthritis    CAD (coronary artery disease)    04/15/18 65% dLAD, 20% pLAD, 30% 2nd diag, Normal EF   Dysrhythmia    afib   Gallstones 07/1996   Headache    couple times a year   History of CT scan of head 1997   small vessel disease   History of ETT 05/2002    no EKG changes   History of MRI of cervical spine 07/15/2002   Hypertension    Lumbar spondylolysis 12/17/2016   MGUS (monoclonal gammopathy of unknown significance) 04/11/2012   Occipital neuralgia of left side 12/09/2018   Peripheral neuropathy    Pneumonia 10/2014   PONV (postoperative nausea and vomiting)    once many years ago   Primary localized osteoarthritis of left hip 07/08/2018   Primary localized osteoarthrosis of left shoulder 12/25/2016   Primary localized osteoarthrosis of right shoulder 11/05/2017   Rectal bleeding 12/2002   colonoscopy   Seizures    last seizure 1976   Skin cancer    Sleep apnea    uses CPAP   Spinal stenosis, lumbar region with neurogenic claudication    Stroke    ?tia ?afib   TIA (transient ischemic attack)    Ulnar neuropathy at elbow 12/24/2014   Left   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 extremity 03/29/2015   Past Surgical History:  Procedure Laterality Date   ANTERIOR CERVICAL DECOMP/DISCECTOMY FUSION N/A 02/04/2015   Procedure: ANTERIOR CERVICAL  DECOMPRESSION/DISCECTOMY FUSION CERVICAL FIVE-SIX,CERVICAL SIX-SEVEN;  Surgeon: Aliene Beams, MD;  Location: [facility];  Service: Neurosurgery;  Laterality: N/A;   arthroscopic knee surgery     BACK SURGERY     CATARACT EXTRACTION Bilateral    COLONOSCOPY W/ POLYPECTOMY  04/19/2003   tubular adenoma   EYE SURGERY     Bilateral Cataracts   GREAT TOE ARTHRODESIS, INTERPHALANGEAL JOINT Left 1978   JOINT REPLACEMENT     joint replacement on right thumb Right    LEFT HEART CATH AND CORONARY ANGIOGRAPHY N/A 04/15/2018   Procedure: LEFT HEART CATH AND CORONARY ANGIOGRAPHY;  Surgeon: Lennette Bihari, MD;  Location: [facility];  Service: Cardiovascular;  Laterality: N/A;   LUMBAR LAMINECTOMY/DECOMPRESSION MICRODISCECTOMY N/A 07/19/2017   Procedure: LAMINECTOMY AND FORAMINOTOMY  LUMBAR TWO- LUMBAR THREE, LUMBAR THREE- LUMBAR FOUR;  Surgeon: Coletta Memos, MD;  Location: [facility];  Service: Neurosurgery;  Laterality: N/A;   nerve transfer surgery to lwft hand Left    Nov. 2017   SHOULDER ARTHROSCOPY W/ ROTATOR CUFF REPAIR Left 2013   rotator cuff    SHOULDER INJECTION  03/2011   left, [facility]   SKIN CANCER EXCISION  07/1993  left lateral arm   SUPERFICIAL KERATECTOMY Right 05/1998   wrist   thumb injection  03/2011   left, [facility]   TOE SURGERY Right    TONSILLECTOMY AND ADENOIDECTOMY     TOTAL HIP ARTHROPLASTY Left 07/08/2018   TOTAL HIP ARTHROPLASTY Left 07/08/2018   Procedure: LEFT TOTAL HIP ARTHROPLASTY;  Surgeon: Teryl Lucy, MD;  Location: [facility];  Service: Orthopedics;  Laterality: Left;   TOTAL SHOULDER ARTHROPLASTY Left 12/25/2016   Procedure: TOTAL SHOULDER ARTHROPLASTY;  Surgeon: Teryl Lucy, MD;  Location: [facility];  Service: Orthopedics;  Laterality: Left;   TOTAL SHOULDER ARTHROPLASTY Right 11/05/2017   TOTAL SHOULDER ARTHROPLASTY Right 11/05/2017   Procedure: TOTAL SHOULDER ARTHROPLASTY;  Surgeon: Teryl Lucy, MD;  Location: [facility];  Service: Orthopedics;   Laterality: Right;   ulnar nerve transfer  12/1999   left   ULNAR NERVE TRANSPOSITION Left    Patient Active Problem List   Diagnosis Date Noted   Memory loss 04/18/2021   Occipital neuralgia of left side 12/09/2018   Primary localized osteoarthritis of left hip 07/08/2018   Primary localized osteoarthritis of hip 07/08/2018   CAD (coronary artery disease) 04/16/2018   Primary localized osteoarthrosis of right shoulder 11/05/2017   Primary localized osteoarthrosis of shoulder 11/05/2017   Lumbar stenosis with neurogenic claudication 07/19/2017   Primary localized osteoarthrosis of left shoulder 12/25/2016   S/P shoulder replacement 12/25/2016   Lumbar spondylolysis 12/17/2016   Paresthesia 01/24/2016   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 extremity 03/29/2015   Cervical spinal stenosis 02/04/2015   Bronchiectasis without acute exacerbation 01/11/2015   TIA (transient ischemic attack) 12/15/2014   Hyperlipidemia 11/24/2014   PAF (paroxysmal atrial fibrillation)    MGUS (monoclonal gammopathy of unknown significance) 04/11/2012   Gout, unspecified 10/04/2009   BPH (benign prostatic hyperplasia) 12/14/2008   ALLERGIC CONJUNCTIVITIS 12/03/2007   TESTOSTERONE DEFICIENCY 11/14/2007   Thoracic back pain 04/22/2007   OBESITY, NOS 12/26/2006   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   THERAPY DIAG:  Unsteadiness on feet  Muscle weakness (generalized)  Pain in left hip  Chronic pain of right knee  Difficulty in walking, not elsewhere classified  Rationale for Evaluation and Treatment:  Rehabilitation  ONSET DATE: ~ 1 year  SUBJECTIVE:   SUBJECTIVE STATEMENT: "The Knee is just going to be the knee."  PERTINENT HISTORY: PMHX significant of a-fib, TIA, Alzheimer's. Recent hospitalization, presented due to left sided weakness.  History of ACDF, back surgery, L great toe arthrodesis, bil TSA, R THA, OA multiple sites.   Neuropathy bil feet.   Per MD notes "Since Discharge he is doing well overall. Wife accompanies him today and aides in hx. He had stopped walking and doing this b.c he thought the ortho.told him if something happened to his rt knee he would down. He became more deconditioned due to this. He is now wearing a knee brace and trying to walk more. Using walking for long distanced. Denies worsening weakness or any current neuro.changes. Wife checking bp , did have fu with cardiology and they want her to reach out with him numbers over the next week or so to see  if any medications needs to be restarted. Vitals look good today in office."  PAIN:  Are you having pain? Yes: NPRS scale: 2/10 Pain location: L hip Pain description: aches Aggravating factors: walking flat footed Relieving factors:   Are you having pain? Yes: NPRS scale: 3-4/10 Pain location: R knee Pain description: ache Aggravating factors: walking Relieving factors: knee brace, walker   Are you having pain? Yes: NPRS scale: 2/10 Pain location: R great toe Pain description: constant low level ache Aggravating factors:  Relieving factors: walks flat footed  PRECAUTIONS: None  WEIGHT BEARING RESTRICTIONS: No  FALLS:  Has patient fallen in last 6 months? No  LIVING ENVIRONMENT: Lives with: lives with their spouse Lives in: House/apartment Stairs: Yes: External: 1 steps; none Has following equipment at home: Environmental consultant - 2 wheeled and Wheelchair (power)  OCCUPATION: retired    PLOF: Independent with household mobility with device  PATIENT GOALS: less pain    OBJECTIVE:   DIAGNOSTIC  FINDINGS: 03/28/23 MR Brain IMPRESSION:  Mildly motion degraded exam.   No evidence of acute intracranial abnormality.   Mild chronic small vessel image changes within the cerebral white  matter, slightly progressed from the prior MRI of 02/22/2016.   Small chronic lacunar infarct within the right basal ganglia, new  from the prior MRI.   Mild-to-moderate for age generalized cerebral atrophy.   PATIENT SURVEYS:  LEFS 36/80 = 45% ability   COGNITION: Overall cognitive status: Within functional limits for tasks assessed and History of cognitive impairments - at baseline     SENSATION: Not tested  EDEMA:  Bil +1 ankle edema  MUSCLE LENGTH: Hamstrings: mild tightness bil   POSTURE: forward head and decreased lumbar lordosis  PALPATION: Tenderness over L hip trochanter, L glut medius and piriforms  LOWER EXTREMITY ROM:  tightness L hip flexion, to neutral.  Good hip mobility for ER and IR bil, symmetric.     LOWER EXTREMITY MMT:  MMT Right eval Left eval  Hip flexion 5 5  Hip extension 4 4  Hip abduction 5 5  Hip adduction 5 5  Knee flexion 5 5  Knee extension 5 5  Ankle dorsiflexion 4 4  Ankle plantarflexion 4 4   (Blank rows = not tested)  LOWER EXTREMITY SPECIAL TESTS:  NT  FUNCTIONAL TESTS:  5 times sit to stand: 13.7 seconds - without UE assist.  Table height 20 inches (to place hips and knees at 90/90) 2 minute walk test: 270' without AD MCTSIB: Condition 1: Avg of 3 trials: 30 sec, Condition 2: Avg of 3 trials: 30 sec, Condition 3: Avg of 3 trials: 30 sec, Condition 4: Avg of 3 trials: 6 sec, and Total Score: 96/120  DGI 13/24 GAIT: Distance walked: 270' Assistive device utilized: None Level of assistance: Complete Independence Comments: gait with forward flexed posture, hip and knee flexion throughout gait cycle, landing flat footed, decreased stride length.  Brace on R knee.  Prefers walking with 2WRW for community ambulation.    Gait speed with RW -  0.4 m/s, without AD 0.68 m/s   TODAY'S TREATMENT:  DATE:  02/12/22 Therapeutic Exercise: to improve strength and mobility.  Demo, verbal and tactile cues throughout for technique. Nustep L5 x 6 min  LE only  Standing toe raises  Standing hip abduction x 10 2# weight Standing hip extension x 10 2# weight Standing hamstring curl x 10 2# weight Seated LAQ 2# 2x15 bil Seated hip up/over bean bag 2# 2x10 Seated heel raises 2# 2 x 10   Manual Therapy: to decrease muscle spasm, pain and improve mobility.  JOINT mobs for R knee grade I-II for pain in sitting  02/07/23 Therapeutic Exercise: to improve strength and mobility.  Demo, verbal and tactile cues throughout for technique. Nustep L5 x 6 min  LE only  At counter with bil UE support for safety: Standing toe raises 2 x 10 Standing hip extension 2  x 10 bil Standing hip abduction 2 x 10 bil  Standing hamstring curls 2 x 10 bil  Standing marches x 10 bil  Seated ball squeeze x 10 Seated step overs 2 x 10 bil Seated LAQ #2 2 x 10 bil  Seated heel raises 2# 2 x 10  Seated marches 2# x 20     02/05/23 Therapeutic Exercise: to improve strength and mobility.  Demo, verbal and tactile cues throughout for technique. Nustep L5 x 6 min  - Toe Raises with Counter Support  - 10 reps - Standing Hip Extension with Counter Support  - 10 reps - Standing Hip Abduction with Counter Support  - 10 reps - Seated Hamstring Stretch  - 3 reps - 30 sec  hold - Seated Long Arc Quad  - 10 reps - Seated Quad Set 10 reps - 5 sec  hold - Seated Step overs - 2 sets - 10 reps Therapeutic Activity:  to assess balance DGI   01/28/23 Self Care: Education on safety and orthostatic hypotension, as well as findings and POC.  Discussed orthotics as well.     PATIENT EDUCATION:  Education details: see self care Person educated:  Patient Education method: Explanation and Handouts Education comprehension: verbalized understanding  HOME EXERCISE PROGRAM: Access Code: [email or web address] URL: [email or web address] Date: 02/05/2023 Prepared by: Harrie Foreman  Exercises - Toe Raises with Counter Support  - 1 x daily - 7 x weekly - 2 sets - 10 reps - Standing Hip Extension with Counter Support  - 1 x daily - 7 x weekly - 2 sets - 10 reps - Standing Hip Abduction with Counter Support  - 1 x daily - 7 x weekly - 2 sets - 10 reps - Seated Hamstring Stretch  - 1 x daily - 7 x weekly - 1 sets - 3 reps - 30 sec  hold - Seated Long Arc Quad  - 1 x daily - 7 x weekly - 2 sets - 10 reps - Seated Quad Set  - 1 x daily - 7 x weekly - 1 sets - 10 reps - 5 sec  hold - Seated March  - 1 x daily - 7 x weekly - 2 sets - 10 reps  Patient Education - Postural Hypotension - Check for Safety  ASSESSMENT:  CLINICAL IMPRESSION: Jose [name]. Showed a good tolerance for the progression of exercises today. Throughout session he required cues for posture and slow pace with exercises. He showed a good response to the joint mobs post session. He does need reinforcement on HEP compliance as he is only partially complaint.   OBJECTIVE IMPAIRMENTS: Abnormal gait, decreased activity tolerance, decreased balance,  decreased endurance, decreased mobility, difficulty walking, decreased ROM, decreased strength, increased muscle spasms, impaired flexibility, postural dysfunction, and pain.   ACTIVITY LIMITATIONS: carrying, bending, standing, squatting, stairs, and locomotion level  PARTICIPATION LIMITATIONS: cleaning, community activity, and yard work  PERSONAL FACTORS: Age and 3+ comorbidities: A-fib, TIA, Alzheimer's, history of ACDF, back surgery, L great toe arthrodesis, bil TSA, OA multiple sites.   are also affecting patient's functional outcome.   REHAB POTENTIAL: Good  CLINICAL DECISION MAKING: Evolving/moderate  complexity  EVALUATION COMPLEXITY: Moderate   GOALS: Goals reviewed with patient? Yes  SHORT TERM GOALS: Target date: 02/11/2023   Patient will be independent with initial HEP. Baseline:  Goal status: IN PROGRESS 02/05/23 given initial HEP  2.  Patient will be educated on strategies to decrease risk of falls.  Baseline: educated and handout handout provided Goal status: MET   LONG TERM GOALS: Target date: 03/25/2023   Patient will be independent with advanced/ongoing HEP to improve outcomes and carryover.  Baseline:  Goal status: IN PROGRESS  2.  Patient will be able to ambulate 600' without increased R hip pain and with good safety to access community.  Baseline: 270' Goal status: IN PROGRESS  3.   Patient will demonstrate improved functional LE strength as demonstrated by 4+/5 hip and ankle strength. Baseline: see objective Goal status: IN PROGRESS  4.  Patient will demonstrate at least 19/24 on DGI to improve gait stability and reduce risk for falls. Baseline: 02/05/23 13/24 Goal status: IN PROGRESS  5.  Patient will report 9 points improvement on LEFS to demonstrate improved functional ability. Baseline: 36/80 Goal status: IN PROGRESS  6.  Patient will demonstrate gait speed of >0.8 m/s to be a safe community ambulator with decreased risk for recurrent falls.  Baseline: 0.4 with RW, 0.68 m/s without AD Goal status: IN PROGRESS  7.  Patient will report 75% improvement in R hip pain to improve QOL.  Baseline:  Goal status: IN PROGRESS PLAN:   PT FREQUENCY: 1-2x/week  PT DURATION: 8 weeks  PLANNED INTERVENTIONS: Therapeutic exercises, Therapeutic activity, Neuromuscular re-education, Balance training, Gait training, Patient/Family education, Self Care, Joint mobilization, Stair training, Orthotic/Fit training, Dry Needling, Electrical stimulation, Spinal mobilization, Cryotherapy, Moist heat, Ultrasound, Ionotophoresis /ml Dexamethasone, Manual therapy, and  Re-evaluation  PLAN FOR NEXT SESSION: HEP for hip strengthening, manual therapy/modalities for R hip/glutes/piriformis   Darleene Cleaver, PTA  02/13/2023, 4:55 PM

## 2023-02-18 ENCOUNTER — Ambulatory Visit: Payer: Medicare Other | Admitting: Physical Therapy

## 2023-02-18 ENCOUNTER — Encounter: Payer: Self-pay | Admitting: Physical Therapy

## 2023-02-18 DIAGNOSIS — G8929 Other chronic pain: Secondary | ICD-10-CM

## 2023-02-18 DIAGNOSIS — R2681 Unsteadiness on feet: Secondary | ICD-10-CM | POA: Diagnosis not present

## 2023-02-18 DIAGNOSIS — R262 Difficulty in walking, not elsewhere classified: Secondary | ICD-10-CM

## 2023-02-18 DIAGNOSIS — M25552 Pain in left hip: Secondary | ICD-10-CM

## 2023-02-18 DIAGNOSIS — M6281 Muscle weakness (generalized): Secondary | ICD-10-CM

## 2023-02-18 NOTE — Therapy (Signed)
OUTPATIENT PHYSICAL THERAPY TREATMENT   Patient Name: Jose [name]. MRN: [id number] DOB:[birth year], 87 y.o., male Today's Date: 02/18/2023  END OF SESSION:  PT End of Session - 02/18/23 1323     Visit Number 5    Number of Visits 16    Date for PT Re-Evaluation 03/25/23    Authorization Type Medicare + [employer]    Progress Note Due on Visit 10    PT Start Time 1318    PT Stop Time 1356    PT Time Calculation (min) 38 min    Activity Tolerance Patient tolerated treatment well    Behavior During Therapy WFL for tasks assessed/performed               Past Medical History:  Diagnosis Date   Arthritis    CAD (coronary artery disease)    04/15/18 65% dLAD, 20% pLAD, 30% 2nd diag, Normal EF   Dysrhythmia    afib   Gallstones 07/1996   Headache    couple times a year   History of CT scan of head 1997   small vessel disease   History of ETT 05/2002    no EKG changes   History of MRI of cervical spine 07/15/2002   Hypertension    Lumbar spondylolysis 12/17/2016   MGUS (monoclonal gammopathy of unknown significance) 04/11/2012   Occipital neuralgia of left side 12/09/2018   Peripheral neuropathy    Pneumonia 10/2014   PONV (postoperative nausea and vomiting)    once many years ago   Primary localized osteoarthritis of left hip 07/08/2018   Primary localized osteoarthrosis of left shoulder 12/25/2016   Primary localized osteoarthrosis of right shoulder 11/05/2017   Rectal bleeding 12/2002   colonoscopy   Seizures    last seizure 1976   Skin cancer    Sleep apnea    uses CPAP   Spinal stenosis, lumbar region with neurogenic claudication    Stroke    ?tia ?afib   TIA (transient ischemic attack)    Ulnar neuropathy at elbow 12/24/2014   Left   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 extremity 03/29/2015   Past Surgical History:  Procedure Laterality Date   ANTERIOR CERVICAL DECOMP/DISCECTOMY FUSION N/A 02/04/2015   Procedure: ANTERIOR CERVICAL DECOMPRESSION/DISCECTOMY  FUSION CERVICAL FIVE-SIX,CERVICAL SIX-SEVEN;  Surgeon: Aliene Beams, MD;  Location: [facility];  Service: Neurosurgery;  Laterality: N/A;   arthroscopic knee surgery     BACK SURGERY     CATARACT EXTRACTION Bilateral    COLONOSCOPY W/ POLYPECTOMY  04/19/2003   tubular adenoma   EYE SURGERY     Bilateral Cataracts   GREAT TOE ARTHRODESIS, INTERPHALANGEAL JOINT Left 1978   JOINT REPLACEMENT     joint replacement on right thumb Right    LEFT HEART CATH AND CORONARY ANGIOGRAPHY N/A 04/15/2018   Procedure: LEFT HEART CATH AND CORONARY ANGIOGRAPHY;  Surgeon: Lennette Bihari, MD;  Location: [facility];  Service: Cardiovascular;  Laterality: N/A;   LUMBAR LAMINECTOMY/DECOMPRESSION MICRODISCECTOMY N/A 07/19/2017   Procedure: LAMINECTOMY AND FORAMINOTOMY  LUMBAR TWO- LUMBAR THREE, LUMBAR THREE- LUMBAR FOUR;  Surgeon: Coletta Memos, MD;  Location: [facility];  Service: Neurosurgery;  Laterality: N/A;   nerve transfer surgery to lwft hand Left    Nov. 2017   SHOULDER ARTHROSCOPY W/ ROTATOR CUFF REPAIR Left 2013   rotator cuff    SHOULDER INJECTION  03/2011   left, [facility]   SKIN CANCER EXCISION  07/1993   left lateral arm  SUPERFICIAL KERATECTOMY Right 05/1998   wrist   thumb injection  03/2011   left, [facility]   TOE SURGERY Right    TONSILLECTOMY AND ADENOIDECTOMY     TOTAL HIP ARTHROPLASTY Left 07/08/2018   TOTAL HIP ARTHROPLASTY Left 07/08/2018   Procedure: LEFT TOTAL HIP ARTHROPLASTY;  Surgeon: Teryl Lucy, MD;  Location: [facility];  Service: Orthopedics;  Laterality: Left;   TOTAL SHOULDER ARTHROPLASTY Left 12/25/2016   Procedure: TOTAL SHOULDER ARTHROPLASTY;  Surgeon: Teryl Lucy, MD;  Location: [facility];  Service: Orthopedics;  Laterality: Left;   TOTAL SHOULDER ARTHROPLASTY Right 11/05/2017   TOTAL SHOULDER ARTHROPLASTY Right 11/05/2017   Procedure: TOTAL SHOULDER ARTHROPLASTY;  Surgeon: Teryl Lucy, MD;  Location: [facility];  Service: Orthopedics;  Laterality: Right;   ulnar nerve  transfer  12/1999   left   ULNAR NERVE TRANSPOSITION Left    Patient Active Problem List   Diagnosis Date Noted   Memory loss 04/18/2021   Occipital neuralgia of left side 12/09/2018   Primary localized osteoarthritis of left hip 07/08/2018   Primary localized osteoarthritis of hip 07/08/2018   CAD (coronary artery disease) 04/16/2018   Primary localized osteoarthrosis of right shoulder 11/05/2017   Primary localized osteoarthrosis of shoulder 11/05/2017   Lumbar stenosis with neurogenic claudication 07/19/2017   Primary localized osteoarthrosis of left shoulder 12/25/2016   S/P shoulder replacement 12/25/2016   Lumbar spondylolysis 12/17/2016   Paresthesia 01/24/2016   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 extremity 03/29/2015   Cervical spinal stenosis 02/04/2015   Bronchiectasis without acute exacerbation 01/11/2015   TIA (transient ischemic attack) 12/15/2014   Hyperlipidemia 11/24/2014   PAF (paroxysmal atrial fibrillation)    MGUS (monoclonal gammopathy of unknown significance) 04/11/2012   Gout, unspecified 10/04/2009   BPH (benign prostatic hyperplasia) 12/14/2008   ALLERGIC CONJUNCTIVITIS 12/03/2007   TESTOSTERONE DEFICIENCY 11/14/2007   Thoracic back pain 04/22/2007   OBESITY, NOS 12/26/2006   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   THERAPY DIAG:  Unsteadiness on feet  Muscle weakness (generalized)  Pain in left hip  Chronic pain of right knee  Difficulty in walking, not elsewhere classified  Rationale for Evaluation and Treatment: Rehabilitation  ONSET DATE: ~ 1  year  SUBJECTIVE:   SUBJECTIVE STATEMENT:  I'm doing OK, I'm not wearing my knee brace today so "I'm just me"   PERTINENT HISTORY: PMHX significant of a-fib, TIA, Alzheimer's. Recent hospitalization, presented due to left sided weakness.  History of ACDF, back surgery, L great toe arthrodesis, bil TSA, R THA, OA multiple sites.   Neuropathy bil feet.   Per MD notes "Since Discharge he is doing well overall. Wife accompanies him today and aides in hx. He had stopped walking and doing this b.c he thought the ortho.told him if something happened to his rt knee he would down. He became more deconditioned due to this. He is now wearing a knee brace and trying to walk more. Using walking for long distanced. Denies worsening weakness or any current neuro.changes. Wife checking bp , did have fu with cardiology and they want her to reach out with him numbers over the next week or so  to see if any medications needs to be restarted. Vitals look good today in office."  PAIN:  Are you having pain? Yes: NPRS scale: 2/10 Pain location: R knee  Pain description: twinges  Aggravating factors: nothing  Relieving factors: nothing     PRECAUTIONS: None  WEIGHT BEARING RESTRICTIONS: No  FALLS:  Has patient fallen in last 6 months? No  LIVING ENVIRONMENT: Lives with: lives with their spouse Lives in: House/apartment Stairs: Yes: External: 1 steps; none Has following equipment at home: Environmental consultant - 2 wheeled and Wheelchair (power)  OCCUPATION: retired    PLOF: Independent with household mobility with device  PATIENT GOALS: less pain    OBJECTIVE:   DIAGNOSTIC FINDINGS: 03/28/23 MR Brain IMPRESSION:  Mildly motion degraded exam.   No evidence of acute intracranial abnormality.   Mild chronic small vessel image changes within the cerebral white  matter, slightly progressed from the prior MRI of 02/22/2016.   Small chronic lacunar infarct within the right basal ganglia, new  from the prior MRI.    Mild-to-moderate for age generalized cerebral atrophy.   PATIENT SURVEYS:  LEFS 36/80 = 45% ability   COGNITION: Overall cognitive status: Within functional limits for tasks assessed and History of cognitive impairments - at baseline     SENSATION: Not tested  EDEMA:  Bil +1 ankle edema  MUSCLE LENGTH: Hamstrings: mild tightness bil   POSTURE: forward head and decreased lumbar lordosis  PALPATION: Tenderness over L hip trochanter, L glut medius and piriforms  LOWER EXTREMITY ROM:  tightness L hip flexion, to neutral.  Good hip mobility for ER and IR bil, symmetric.     LOWER EXTREMITY MMT:  MMT Right eval Left eval  Hip flexion 5 5  Hip extension 4 4  Hip abduction 5 5  Hip adduction 5 5  Knee flexion 5 5  Knee extension 5 5  Ankle dorsiflexion 4 4  Ankle plantarflexion 4 4   (Blank rows = not tested)  LOWER EXTREMITY SPECIAL TESTS:  NT  FUNCTIONAL TESTS:  5 times sit to stand: 13.7 seconds - without UE assist.  Table height 20 inches (to place hips and knees at 90/90) 2 minute walk test: 270' without AD MCTSIB: Condition 1: Avg of 3 trials: 30 sec, Condition 2: Avg of 3 trials: 30 sec, Condition 3: Avg of 3 trials: 30 sec, Condition 4: Avg of 3 trials: 6 sec, and Total Score: 96/120  DGI 13/24 GAIT: Distance walked: 270' Assistive device utilized: None Level of assistance: Complete Independence Comments: gait with forward flexed posture, hip and knee flexion throughout gait cycle, landing flat footed, decreased stride length.  Brace on R knee.  Prefers walking with 2WRW for community ambulation.    Gait speed with RW - 0.4 m/s, without AD 0.68 m/s   TODAY'S TREATMENT:  DATE:   02/18/23  TherEx  Nustep L5 x6 minutes BLEs only   Walking bridges x15  MAX CUES Sidelying hip ABD 2# B x10 MOD CUES/FACILITATION Prone hip extensions  2# x10 B Prone hamstring curls 2# x12 B Sit to stands with blue weighted ball 2x10 focus on eccentric stand to sit LAQs 2# x15 B  Sidesteps along mat table with green TB above knees x4 laps minA for balance Seated marches green TB above knees x15 B STS with LEs through green TB x10  for hip ABD activation, cues for slow eccentric lower          02/12/22 Therapeutic Exercise: to improve strength and mobility.  Demo, verbal and tactile cues throughout for technique. Nustep L5 x 6 min  LE only  Standing toe raises  Standing hip abduction x 10 2# weight Standing hip extension x 10 2# weight Standing hamstring curl x 10 2# weight Seated LAQ 2# 2x15 bil Seated hip up/over bean bag 2# 2x10 Seated heel raises 2# 2 x 10   Manual Therapy: to decrease muscle spasm, pain and improve mobility.  JOINT mobs for R knee grade I-II for pain in sitting  02/07/23 Therapeutic Exercise: to improve strength and mobility.  Demo, verbal and tactile cues throughout for technique. Nustep L5 x 6 min  LE only  At counter with bil UE support for safety: Standing toe raises 2 x 10 Standing hip extension 2  x 10 bil Standing hip abduction 2 x 10 bil  Standing hamstring curls 2 x 10 bil  Standing marches x 10 bil  Seated ball squeeze x 10 Seated step overs 2 x 10 bil Seated LAQ #2 2 x 10 bil  Seated heel raises 2# 2 x 10  Seated marches 2# x 20     02/05/23 Therapeutic Exercise: to improve strength and mobility.  Demo, verbal and tactile cues throughout for technique. Nustep L5 x 6 min  - Toe Raises with Counter Support  - 10 reps - Standing Hip Extension with Counter Support  - 10 reps - Standing Hip Abduction with Counter Support  - 10 reps - Seated Hamstring Stretch  - 3 reps - 30 sec  hold - Seated Long Arc Quad  - 10 reps - Seated Quad Set 10 reps - 5 sec  hold - Seated Step overs - 2 sets - 10 reps Therapeutic Activity:  to assess balance DGI   01/28/23 Self Care: Education on safety and  orthostatic hypotension, as well as findings and POC.  Discussed orthotics as well.     PATIENT EDUCATION:  Education details: see self care Person educated: Patient Education method: Explanation and Handouts Education comprehension: verbalized understanding  HOME EXERCISE PROGRAM: Access Code: [email or web address] URL: [email or web address] Date: 02/05/2023 Prepared by: Harrie Foreman  Exercises - Toe Raises with Counter Support  - 1 x daily - 7 x weekly - 2 sets - 10 reps - Standing Hip Extension with Counter Support  - 1 x daily - 7 x weekly - 2 sets - 10 reps - Standing Hip Abduction with Counter Support  - 1 x daily - 7 x weekly - 2 sets - 10 reps - Seated Hamstring Stretch  - 1 x daily - 7 x weekly - 1 sets - 3 reps - 30 sec  hold - Seated Long Arc Quad  - 1 x daily - 7 x weekly - 2 sets - 10 reps - Seated Quad Set  - 1 x daily - 7  x weekly - 1 sets - 10 reps - 5 sec  hold - Seated March  - 1 x daily - 7 x weekly - 2 sets - 10 reps  Patient Education - Postural Hypotension - Check for Safety  ASSESSMENT:  CLINICAL IMPRESSION:  Mr. Jacober arrives today doing well, we continued general strengthening program today with progressions as tolerated. Did well, will continue efforts. Today was this therapist's first time working with pt, noted his HEP is very similar to what he has been doing in clinic- will plan to progress HEP next visit.   OBJECTIVE IMPAIRMENTS: Abnormal gait, decreased activity tolerance, decreased balance, decreased endurance, decreased mobility, difficulty walking, decreased ROM, decreased strength, increased muscle spasms, impaired flexibility, postural dysfunction, and pain.   ACTIVITY LIMITATIONS: carrying, bending, standing, squatting, stairs, and locomotion level  PARTICIPATION LIMITATIONS: cleaning, community activity, and yard work  PERSONAL FACTORS: Age and 3+ comorbidities: A-fib, TIA, Alzheimer's, history of ACDF, back surgery, L great toe  arthrodesis, bil TSA, OA multiple sites.   are also affecting patient's functional outcome.   REHAB POTENTIAL: Good  CLINICAL DECISION MAKING: Evolving/moderate complexity  EVALUATION COMPLEXITY: Moderate   GOALS: Goals reviewed with patient? Yes  SHORT TERM GOALS: Target date: 02/11/2023   Patient will be independent with initial HEP. Baseline:  Goal status: IN PROGRESS 02/05/23 given initial HEP  2.  Patient will be educated on strategies to decrease risk of falls.  Baseline: educated and handout handout provided Goal status: MET   LONG TERM GOALS: Target date: 03/25/2023   Patient will be independent with advanced/ongoing HEP to improve outcomes and carryover.  Baseline:  Goal status: IN PROGRESS  2.  Patient will be able to ambulate 600' without increased R hip pain and with good safety to access community.  Baseline: 270' Goal status: IN PROGRESS  3.   Patient will demonstrate improved functional LE strength as demonstrated by 4+/5 hip and ankle strength. Baseline: see objective Goal status: IN PROGRESS  4.  Patient will demonstrate at least 19/24 on DGI to improve gait stability and reduce risk for falls. Baseline: 02/05/23 13/24 Goal status: IN PROGRESS  5.  Patient will report 9 points improvement on LEFS to demonstrate improved functional ability. Baseline: 36/80 Goal status: IN PROGRESS  6.  Patient will demonstrate gait speed of >0.8 m/s to be a safe community ambulator with decreased risk for recurrent falls.  Baseline: 0.4 with RW, 0.68 m/s without AD Goal status: IN PROGRESS  7.  Patient will report 75% improvement in R hip pain to improve QOL.  Baseline:  Goal status: IN PROGRESS PLAN:   PT FREQUENCY: 1-2x/week  PT DURATION: 8 weeks  PLANNED INTERVENTIONS: Therapeutic exercises, Therapeutic activity, Neuromuscular re-education, Balance training, Gait training, Patient/Family education, Self Care, Joint mobilization, Stair training, Orthotic/Fit  training, Dry Needling, Electrical stimulation, Spinal mobilization, Cryotherapy, Moist heat, Ultrasound, Ionotophoresis /ml Dexamethasone, Manual therapy, and Re-evaluation  PLAN FOR NEXT SESSION: HEP for hip strengthening, manual therapy for R hip/glutes/piriformis, HEP updates   Nedra Hai PT DPT PN2

## 2023-02-21 ENCOUNTER — Encounter: Payer: Self-pay | Admitting: Physical Therapy

## 2023-02-21 ENCOUNTER — Ambulatory Visit: Payer: Medicare Other | Admitting: Physical Therapy

## 2023-02-21 DIAGNOSIS — M6281 Muscle weakness (generalized): Secondary | ICD-10-CM

## 2023-02-21 DIAGNOSIS — R2681 Unsteadiness on feet: Secondary | ICD-10-CM | POA: Diagnosis not present

## 2023-02-21 DIAGNOSIS — M25552 Pain in left hip: Secondary | ICD-10-CM

## 2023-02-21 DIAGNOSIS — G8929 Other chronic pain: Secondary | ICD-10-CM

## 2023-02-21 DIAGNOSIS — R262 Difficulty in walking, not elsewhere classified: Secondary | ICD-10-CM

## 2023-02-21 NOTE — Therapy (Signed)
OUTPATIENT PHYSICAL THERAPY TREATMENT   Patient Name: Jose [name]. MRN: [id number] DOB:07/30/1936, 87 y.o., male Today's Date: 02/21/2023  END OF SESSION:  PT End of Session - 02/21/23 1322     Visit Number 6    Number of Visits 16    Date for PT Re-Evaluation 03/25/23    Authorization Type Medicare + [employer]    Progress Note Due on Visit 10    PT Start Time 1317    PT Stop Time 1355    PT Time Calculation (min) 38 min    Activity Tolerance Patient tolerated treatment well    Behavior During Therapy WFL for tasks assessed/performed                Past Medical History:  Diagnosis Date   Arthritis    CAD (coronary artery disease)    04/15/18 65% dLAD, 20% pLAD, 30% 2nd diag, Normal EF   Dysrhythmia    afib   Gallstones 07/1996   Headache    couple times a year   History of CT scan of head 1997   small vessel disease   History of ETT 05/2002    no EKG changes   History of MRI of cervical spine 07/15/2002   Hypertension    Lumbar spondylolysis 12/17/2016   MGUS (monoclonal gammopathy of unknown significance) 04/11/2012   Occipital neuralgia of left side 12/09/2018   Peripheral neuropathy    Pneumonia 10/2014   PONV (postoperative nausea and vomiting)    once many years ago   Primary localized osteoarthritis of left hip 07/08/2018   Primary localized osteoarthrosis of left shoulder 12/25/2016   Primary localized osteoarthrosis of right shoulder 11/05/2017   Rectal bleeding 12/2002   colonoscopy   Seizures    last seizure 1976   Skin cancer    Sleep apnea    uses CPAP   Spinal stenosis, lumbar region with neurogenic claudication    Stroke    ?tia ?afib   TIA (transient ischemic attack)    Ulnar neuropathy at elbow 12/24/2014   Left   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 extremity 03/29/2015   Past Surgical History:  Procedure Laterality Date   ANTERIOR CERVICAL DECOMP/DISCECTOMY FUSION N/A 02/04/2015   Procedure: ANTERIOR CERVICAL  DECOMPRESSION/DISCECTOMY FUSION CERVICAL FIVE-SIX,CERVICAL SIX-SEVEN;  Surgeon: Aliene Beams, MD;  Location: [facility];  Service: Neurosurgery;  Laterality: N/A;   arthroscopic knee surgery     BACK SURGERY     CATARACT EXTRACTION Bilateral    COLONOSCOPY W/ POLYPECTOMY  04/19/2003   tubular adenoma   EYE SURGERY     Bilateral Cataracts   GREAT TOE ARTHRODESIS, INTERPHALANGEAL JOINT Left 1978   JOINT REPLACEMENT     joint replacement on right thumb Right    LEFT HEART CATH AND CORONARY ANGIOGRAPHY N/A 04/15/2018   Procedure: LEFT HEART CATH AND CORONARY ANGIOGRAPHY;  Surgeon: Lennette Bihari, MD;  Location: [facility];  Service: Cardiovascular;  Laterality: N/A;   LUMBAR LAMINECTOMY/DECOMPRESSION MICRODISCECTOMY N/A 07/19/2017   Procedure: LAMINECTOMY AND FORAMINOTOMY  LUMBAR TWO- LUMBAR THREE, LUMBAR THREE- LUMBAR FOUR;  Surgeon: Coletta Memos, MD;  Location: [facility];  Service: Neurosurgery;  Laterality: N/A;   nerve transfer surgery to lwft hand Left    Nov. 2017   SHOULDER ARTHROSCOPY W/ ROTATOR CUFF REPAIR Left 2013   rotator cuff    SHOULDER INJECTION  03/2011   left, [facility]   SKIN CANCER EXCISION  07/1993   left lateral arm  SUPERFICIAL KERATECTOMY Right 05/1998   wrist   thumb injection  03/2011   left, [facility]   TOE SURGERY Right    TONSILLECTOMY AND ADENOIDECTOMY     TOTAL HIP ARTHROPLASTY Left 07/08/2018   TOTAL HIP ARTHROPLASTY Left 07/08/2018   Procedure: LEFT TOTAL HIP ARTHROPLASTY;  Surgeon: Teryl Lucy, MD;  Location: [facility];  Service: Orthopedics;  Laterality: Left;   TOTAL SHOULDER ARTHROPLASTY Left 12/25/2016   Procedure: TOTAL SHOULDER ARTHROPLASTY;  Surgeon: Teryl Lucy, MD;  Location: [facility];  Service: Orthopedics;  Laterality: Left;   TOTAL SHOULDER ARTHROPLASTY Right 11/05/2017   TOTAL SHOULDER ARTHROPLASTY Right 11/05/2017   Procedure: TOTAL SHOULDER ARTHROPLASTY;  Surgeon: Teryl Lucy, MD;  Location: [facility];  Service: Orthopedics;   Laterality: Right;   ulnar nerve transfer  12/1999   left   ULNAR NERVE TRANSPOSITION Left    Patient Active Problem List   Diagnosis Date Noted   Memory loss 04/18/2021   Occipital neuralgia of left side 12/09/2018   Primary localized osteoarthritis of left hip 07/08/2018   Primary localized osteoarthritis of hip 07/08/2018   CAD (coronary artery disease) 04/16/2018   Primary localized osteoarthrosis of right shoulder 11/05/2017   Primary localized osteoarthrosis of shoulder 11/05/2017   Lumbar stenosis with neurogenic claudication 07/19/2017   Primary localized osteoarthrosis of left shoulder 12/25/2016   S/P shoulder replacement 12/25/2016   Lumbar spondylolysis 12/17/2016   Paresthesia 01/24/2016   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 extremity 03/29/2015   Cervical spinal stenosis 02/04/2015   Bronchiectasis without acute exacerbation 01/11/2015   TIA (transient ischemic attack) 12/15/2014   Hyperlipidemia 11/24/2014   PAF (paroxysmal atrial fibrillation)    MGUS (monoclonal gammopathy of unknown significance) 04/11/2012   Gout, unspecified 10/04/2009   BPH (benign prostatic hyperplasia) 12/14/2008   ALLERGIC CONJUNCTIVITIS 12/03/2007   TESTOSTERONE DEFICIENCY 11/14/2007   Thoracic back pain 04/22/2007   OBESITY, NOS 12/26/2006   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   THERAPY DIAG:  Unsteadiness on feet  Muscle weakness (generalized)  Pain in left hip  Chronic pain of right knee  Difficulty in walking, not elsewhere classified  Rationale for Evaluation and Treatment:  Rehabilitation  ONSET DATE: ~ 1 year  SUBJECTIVE:   SUBJECTIVE STATEMENT:  I want you to work both legs today equally, you worked the right leg more. I was sore for a few days after last time.   PERTINENT HISTORY: PMHX significant of a-fib, TIA, Alzheimer's. Recent hospitalization, presented due to left sided weakness.  History of ACDF, back surgery, L great toe arthrodesis, bil TSA, R THA, OA multiple sites.   Neuropathy bil feet.   Per MD notes "Since Discharge he is doing well overall. Wife accompanies him today and aides in hx. He had stopped walking and doing this b.c he thought the ortho.told him if something happened to his rt knee he would down. He became more deconditioned due to this. He is now wearing a knee brace and trying to walk more. Using walking for long distanced. Denies worsening weakness or any current neuro.changes. Wife checking bp , did have fu with cardiology and they want her to  reach out with him numbers over the next week or so to see if any medications needs to be restarted. Vitals look good today in office."  PAIN:  Are you having pain? Yes: NPRS scale: 2/10 Pain location: R knee  Pain description: twinges  Aggravating factors: nothing  Relieving factors: nothing     PRECAUTIONS: None  WEIGHT BEARING RESTRICTIONS: No  FALLS:  Has patient fallen in last 6 months? No  LIVING ENVIRONMENT: Lives with: lives with their spouse Lives in: House/apartment Stairs: Yes: External: 1 steps; none Has following equipment at home: Environmental consultant - 2 wheeled and Wheelchair (power)  OCCUPATION: retired    PLOF: Independent with household mobility with device  PATIENT GOALS: less pain    OBJECTIVE:   DIAGNOSTIC FINDINGS: 03/28/23 MR Brain IMPRESSION:  Mildly motion degraded exam.   No evidence of acute intracranial abnormality.   Mild chronic small vessel image changes within the cerebral white  matter, slightly progressed from the prior MRI of 02/22/2016.    Small chronic lacunar infarct within the right basal ganglia, new  from the prior MRI.   Mild-to-moderate for age generalized cerebral atrophy.   PATIENT SURVEYS:  LEFS 36/80 = 45% ability   COGNITION: Overall cognitive status: Within functional limits for tasks assessed and History of cognitive impairments - at baseline     SENSATION: Not tested  EDEMA:  Bil +1 ankle edema  MUSCLE LENGTH: Hamstrings: mild tightness bil   POSTURE: forward head and decreased lumbar lordosis  PALPATION: Tenderness over L hip trochanter, L glut medius and piriforms  LOWER EXTREMITY ROM:  tightness L hip flexion, to neutral.  Good hip mobility for ER and IR bil, symmetric.     LOWER EXTREMITY MMT:  MMT Right eval Left eval  Hip flexion 5 5  Hip extension 4 4  Hip abduction 5 5  Hip adduction 5 5  Knee flexion 5 5  Knee extension 5 5  Ankle dorsiflexion 4 4  Ankle plantarflexion 4 4   (Blank rows = not tested)  LOWER EXTREMITY SPECIAL TESTS:  NT  FUNCTIONAL TESTS:  5 times sit to stand: 13.7 seconds - without UE assist.  Table height 20 inches (to place hips and knees at 90/90) 2 minute walk test: 270' without AD MCTSIB: Condition 1: Avg of 3 trials: 30 sec, Condition 2: Avg of 3 trials: 30 sec, Condition 3: Avg of 3 trials: 30 sec, Condition 4: Avg of 3 trials: 6 sec, and Total Score: 96/120  DGI 13/24 GAIT: Distance walked: 270' Assistive device utilized: None Level of assistance: Complete Independence Comments: gait with forward flexed posture, hip and knee flexion throughout gait cycle, landing flat footed, decreased stride length.  Brace on R knee.  Prefers walking with 2WRW for community ambulation.    Gait speed with RW - 0.4 m/s, without AD 0.68 m/s   TODAY'S TREATMENT:  DATE:   02/21/23  TherEx  Nustep L5x6 minutes BLEs only Standing  alternating marches 3# each LE x10 B (20 total) Backwards walking 3# BLEs 82ft x6, cues for improved step lengths and upright posture safe speed of movement Hip flexion + horizontal ABD x10 B 3#  Hamstring curls 3# BLEs x12B Sidesteps with 3# BLEs 70ft x6  Walking intervals 3# each LE 16ft   02/18/23  TherEx  Nustep L5 x6 minutes BLEs only   Walking bridges x15  MAX CUES Sidelying hip ABD 2# B x10 MOD CUES/FACILITATION Prone hip extensions 2# x10 B Prone hamstring curls 2# x12 B Sit to stands with blue weighted ball 2x10 focus on eccentric stand to sit LAQs 2# x15 B  Sidesteps along mat table with green TB above knees x4 laps minA for balance Seated marches green TB above knees x15 B STS with LEs through green TB x10  for hip ABD activation, cues for slow eccentric lower          02/12/22 Therapeutic Exercise: to improve strength and mobility.  Demo, verbal and tactile cues throughout for technique. Nustep L5 x 6 min  LE only  Standing toe raises  Standing hip abduction x 10 2# weight Standing hip extension x 10 2# weight Standing hamstring curl x 10 2# weight Seated LAQ 2# 2x15 bil Seated hip up/over bean bag 2# 2x10 Seated heel raises 2# 2 x 10   Manual Therapy: to decrease muscle spasm, pain and improve mobility.  JOINT mobs for R knee grade I-II for pain in sitting  02/07/23 Therapeutic Exercise: to improve strength and mobility.  Demo, verbal and tactile cues throughout for technique. Nustep L5 x 6 min  LE only  At counter with bil UE support for safety: Standing toe raises 2 x 10 Standing hip extension 2  x 10 bil Standing hip abduction 2 x 10 bil  Standing hamstring curls 2 x 10 bil  Standing marches x 10 bil  Seated ball squeeze x 10 Seated step overs 2 x 10 bil Seated LAQ #2 2 x 10 bil  Seated heel raises 2# 2 x 10  Seated marches 2# x 20     02/05/23 Therapeutic Exercise: to improve strength and mobility.  Demo, verbal and tactile cues throughout  for technique. Nustep L5 x 6 min  - Toe Raises with Counter Support  - 10 reps - Standing Hip Extension with Counter Support  - 10 reps - Standing Hip Abduction with Counter Support  - 10 reps - Seated Hamstring Stretch  - 3 reps - 30 sec  hold - Seated Long Arc Quad  - 10 reps - Seated Quad Set 10 reps - 5 sec  hold - Seated Step overs - 2 sets - 10 reps Therapeutic Activity:  to assess balance DGI   01/28/23 Self Care: Education on safety and orthostatic hypotension, as well as findings and POC.  Discussed orthotics as well.     PATIENT EDUCATION:  Education details: see self care Person educated: Patient Education method: Explanation and Handouts Education comprehension: verbalized understanding  HOME EXERCISE PROGRAM: Access Code: [email or web address] URL: [email or web address] Date: 02/05/2023 Prepared by: Harrie Foreman  Exercises - Toe Raises with Counter Support  - 1 x daily - 7 x weekly - 2 sets - 10 reps - Standing Hip Extension with Counter Support  - 1 x daily - 7 x weekly - 2 sets - 10 reps - Standing Hip Abduction with Counter Support  - 1 x  daily - 7 x weekly - 2 sets - 10 reps - Seated Hamstring Stretch  - 1 x daily - 7 x weekly - 1 sets - 3 reps - 30 sec  hold - Seated Long Arc Quad  - 1 x daily - 7 x weekly - 2 sets - 10 reps - Seated Quad Set  - 1 x daily - 7 x weekly - 1 sets - 10 reps - 5 sec  hold - Seated March  - 1 x daily - 7 x weekly - 2 sets - 10 reps  Patient Education - Postural Hypotension - Check for Safety  ASSESSMENT:  CLINICAL IMPRESSION:  Mr. Stankus arrives today asking me to exercise both legs today- per chart review, both LEs were worked equally last session, education provided on DOMS post-exercise and importance of physical challenge in PT. Continued working on functional strengthening as tolerated today. He was a bit resistant to progressions of exercise, perseverated on being sore from more difficult exercises. Will continue  efforts, deferred HEP updates today but did try to give more varied workout in today's session.   OBJECTIVE IMPAIRMENTS: Abnormal gait, decreased activity tolerance, decreased balance, decreased endurance, decreased mobility, difficulty walking, decreased ROM, decreased strength, increased muscle spasms, impaired flexibility, postural dysfunction, and pain.   ACTIVITY LIMITATIONS: carrying, bending, standing, squatting, stairs, and locomotion level  PARTICIPATION LIMITATIONS: cleaning, community activity, and yard work  PERSONAL FACTORS: Age and 3+ comorbidities: A-fib, TIA, Alzheimer's, history of ACDF, back surgery, L great toe arthrodesis, bil TSA, OA multiple sites.   are also affecting patient's functional outcome.   REHAB POTENTIAL: Good  CLINICAL DECISION MAKING: Evolving/moderate complexity  EVALUATION COMPLEXITY: Moderate   GOALS: Goals reviewed with patient? Yes  SHORT TERM GOALS: Target date: 02/11/2023   Patient will be independent with initial HEP. Baseline:  Goal status: IN PROGRESS 02/05/23 given initial HEP  2.  Patient will be educated on strategies to decrease risk of falls.  Baseline: educated and handout handout provided Goal status: MET   LONG TERM GOALS: Target date: 03/25/2023   Patient will be independent with advanced/ongoing HEP to improve outcomes and carryover.  Baseline:  Goal status: IN PROGRESS  2.  Patient will be able to ambulate 600' without increased R hip pain and with good safety to access community.  Baseline: 270' Goal status: IN PROGRESS  3.   Patient will demonstrate improved functional LE strength as demonstrated by 4+/5 hip and ankle strength. Baseline: see objective Goal status: IN PROGRESS  4.  Patient will demonstrate at least 19/24 on DGI to improve gait stability and reduce risk for falls. Baseline: 02/05/23 13/24 Goal status: IN PROGRESS  5.  Patient will report 9 points improvement on LEFS to demonstrate improved functional  ability. Baseline: 36/80 Goal status: IN PROGRESS  6.  Patient will demonstrate gait speed of >0.8 m/s to be a safe community ambulator with decreased risk for recurrent falls.  Baseline: 0.4 with RW, 0.68 m/s without AD Goal status: IN PROGRESS  7.  Patient will report 75% improvement in R hip pain to improve QOL.  Baseline:  Goal status: IN PROGRESS PLAN:   PT FREQUENCY: 1-2x/week  PT DURATION: 8 weeks  PLANNED INTERVENTIONS: Therapeutic exercises, Therapeutic activity, Neuromuscular re-education, Balance training, Gait training, Patient/Family education, Self Care, Joint mobilization, Stair training, Orthotic/Fit training, Dry Needling, Electrical stimulation, Spinal mobilization, Cryotherapy, Moist heat, Ultrasound, Ionotophoresis /ml Dexamethasone, Manual therapy, and Re-evaluation  PLAN FOR NEXT SESSION: HEP for hip strengthening, manual therapy  for R hip/glutes/piriformis  Nedra Hai PT DPT PN2

## 2023-02-25 ENCOUNTER — Ambulatory Visit: Payer: Medicare Other

## 2023-02-25 DIAGNOSIS — M6281 Muscle weakness (generalized): Secondary | ICD-10-CM

## 2023-02-25 DIAGNOSIS — R2681 Unsteadiness on feet: Secondary | ICD-10-CM

## 2023-02-25 DIAGNOSIS — M25552 Pain in left hip: Secondary | ICD-10-CM

## 2023-02-25 DIAGNOSIS — R262 Difficulty in walking, not elsewhere classified: Secondary | ICD-10-CM

## 2023-02-25 DIAGNOSIS — G8929 Other chronic pain: Secondary | ICD-10-CM

## 2023-02-25 NOTE — Therapy (Signed)
OUTPATIENT PHYSICAL THERAPY TREATMENT   Patient Name: Jose [name]. MRN: [id number] DOB:[birth year], 87 y.o., male Today's Date: 02/25/2023  END OF SESSION:  PT End of Session - 02/25/23 1450     Visit Number 7    Number of Visits 16    Date for PT Re-Evaluation 03/25/23    Authorization Type Medicare + [employer]    Progress Note Due on Visit 10    PT Start Time 1447    PT Stop Time 1528    PT Time Calculation (min) 41 min    Activity Tolerance Patient tolerated treatment well    Behavior During Therapy WFL for tasks assessed/performed                Past Medical History:  Diagnosis Date   Arthritis    CAD (coronary artery disease)    04/15/18 65% dLAD, 20% pLAD, 30% 2nd diag, Normal EF   Dysrhythmia    afib   Gallstones 07/1996   Headache    couple times a year   History of CT scan of head 1997   small vessel disease   History of ETT 05/2002    no EKG changes   History of MRI of cervical spine 07/15/2002   Hypertension    Lumbar spondylolysis 12/17/2016   MGUS (monoclonal gammopathy of unknown significance) 04/11/2012   Occipital neuralgia of left side 12/09/2018   Peripheral neuropathy    Pneumonia 10/2014   PONV (postoperative nausea and vomiting)    once many years ago   Primary localized osteoarthritis of left hip 07/08/2018   Primary localized osteoarthrosis of left shoulder 12/25/2016   Primary localized osteoarthrosis of right shoulder 11/05/2017   Rectal bleeding 12/2002   colonoscopy   Seizures (HCC)    last seizure 1976   Skin cancer    Sleep apnea    uses CPAP   Spinal stenosis, lumbar region with neurogenic claudication    Stroke [facility])    ?tia ?afib   TIA (transient ischemic attack)    Ulnar neuropathy at elbow 12/24/2014   Left   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 extremity 03/29/2015   Past Surgical History:  Procedure Laterality Date   ANTERIOR CERVICAL DECOMP/DISCECTOMY FUSION N/A 02/04/2015   Procedure: ANTERIOR CERVICAL  DECOMPRESSION/DISCECTOMY FUSION CERVICAL FIVE-SIX,CERVICAL SIX-SEVEN;  Surgeon: Aliene Beams, MD;  Location: [facility] NEURO OR[facility];  Service: Neurosurgery;  Laterality: N/A;   arthroscopic knee surgery     BACK SURGERY     CATARACT EXTRACTION Bilateral    COLONOSCOPY W/ POLYPECTOMY  04/19/2003   tubular adenoma   EYE SURGERY     Bilateral Cataracts   GREAT TOE ARTHRODESIS, INTERPHALANGEAL JOINT Left 1978   JOINT REPLACEMENT     joint replacement on right thumb Right    LEFT HEART CATH AND CORONARY ANGIOGRAPHY N/A 04/15/2018   Procedure: LEFT HEART CATH AND CORONARY ANGIOGRAPHY;  Surgeon: Lennette Bihari, MD;  Location: [facility] INVASIVE CV [facility];  Service: Cardiovascular;  Laterality: N/A;   LUMBAR LAMINECTOMY/DECOMPRESSION MICRODISCECTOMY N/A 07/19/2017   Procedure: LAMINECTOMY AND FORAMINOTOMY  LUMBAR TWO- LUMBAR THREE, LUMBAR THREE- LUMBAR FOUR;  Surgeon: Coletta Memos, MD;  Location: [facility] OR;  Service: Neurosurgery;  Laterality: N/A;   nerve transfer surgery to lwft hand Left    Nov. 2017   SHOULDER ARTHROSCOPY W/ ROTATOR CUFF REPAIR Left 2013   rotator cuff    SHOULDER INJECTION  03/2011   left, [place]   SKIN CANCER EXCISION  07/1993   left  lateral arm   SUPERFICIAL KERATECTOMY Right 05/1998   wrist   thumb injection  03/2011   left, [place]   TOE SURGERY Right    TONSILLECTOMY AND ADENOIDECTOMY     TOTAL HIP ARTHROPLASTY Left 07/08/2018   TOTAL HIP ARTHROPLASTY Left 07/08/2018   Procedure: LEFT TOTAL HIP ARTHROPLASTY;  Surgeon: Teryl Lucy, MD;  Location: [facility] OR;  Service: Orthopedics;  Laterality: Left;   TOTAL SHOULDER ARTHROPLASTY Left 12/25/2016   Procedure: TOTAL SHOULDER ARTHROPLASTY;  Surgeon: Teryl Lucy, MD;  Location: [facility] OR;  Service: Orthopedics;  Laterality: Left;   TOTAL SHOULDER ARTHROPLASTY Right 11/05/2017   TOTAL SHOULDER ARTHROPLASTY Right 11/05/2017   Procedure: TOTAL SHOULDER ARTHROPLASTY;  Surgeon: Teryl Lucy, MD;  Location: [facility] OR;  Service: Orthopedics;   Laterality: Right;   ulnar nerve transfer  12/1999   left   ULNAR NERVE TRANSPOSITION Left    Patient Active Problem List   Diagnosis Date Noted   Memory loss 04/18/2021   Occipital neuralgia of left side 12/09/2018   Primary localized osteoarthritis of left hip 07/08/2018   Primary localized osteoarthritis of hip 07/08/2018   CAD (coronary artery disease) 04/16/2018   Primary localized osteoarthrosis of right shoulder 11/05/2017   Primary localized osteoarthrosis of shoulder 11/05/2017   Lumbar stenosis with neurogenic claudication 07/19/2017   Primary localized osteoarthrosis of left shoulder 12/25/2016   S/P shoulder replacement 12/25/2016   Lumbar spondylolysis 12/17/2016   Paresthesia 01/24/2016   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 extremity 03/29/2015   Cervical spinal stenosis 02/04/2015   Bronchiectasis without acute exacerbation (HCC) 01/11/2015   TIA (transient ischemic attack) 12/15/2014   Hyperlipidemia 11/24/2014   PAF (paroxysmal atrial fibrillation) (HCC)    MGUS (monoclonal gammopathy of unknown significance) 04/11/2012   Gout, unspecified 10/04/2009   BPH (benign prostatic hyperplasia) 12/14/2008   ALLERGIC CONJUNCTIVITIS 12/03/2007   TESTOSTERONE DEFICIENCY 11/14/2007   Thoracic back pain 04/22/2007   OBESITY, NOS 12/26/2006   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   THERAPY DIAG:  Unsteadiness on feet  Muscle weakness (generalized)  Pain in left hip  Chronic pain of right knee  Difficulty in walking, not elsewhere classified  Rationale for Evaluation and  Treatment: Rehabilitation  ONSET DATE: ~ 1 year  SUBJECTIVE:   SUBJECTIVE STATEMENT: Pt denies any pain.   PERTINENT HISTORY: PMHX significant of a-fib, TIA, Alzheimer's. Recent hospitalization, presented due to left sided weakness.  History of ACDF, back surgery, L great toe arthrodesis, bil TSA, R THA, OA multiple sites.   Neuropathy bil feet.   Per MD notes "Since Discharge he is doing well overall. Wife accompanies him today and aides in hx. He had stopped walking and doing this b.c he thought the ortho.told him if something happened to his rt knee he would down. He became more deconditioned due to this. He is now wearing a knee brace and trying to walk more. Using walking for long distanced. Denies worsening weakness or any current neuro.changes. Wife checking bp , did have fu with cardiology and they want her to reach out with him numbers over the next week or so to see if any medications  needs to be restarted. Vitals look good today in office."  PAIN:  Are you having pain? Yes: NPRS scale: 0/10 Pain location: R knee  Pain description: twinges  Aggravating factors: nothing  Relieving factors: nothing     PRECAUTIONS: None  WEIGHT BEARING RESTRICTIONS: No  FALLS:  Has patient fallen in last 6 months? No  LIVING ENVIRONMENT: Lives with: lives with their spouse Lives in: House/apartment Stairs: Yes: External: 1 steps; none Has following equipment at home: Environmental consultant - 2 wheeled and Wheelchair (power)  OCCUPATION: retired    PLOF: Independent with household mobility with device  PATIENT GOALS: less pain    OBJECTIVE:   DIAGNOSTIC FINDINGS: 03/28/23 MR Brain IMPRESSION:  Mildly motion degraded exam.   No evidence of acute intracranial abnormality.   Mild chronic small vessel image changes within the cerebral white  matter, slightly progressed from the prior MRI of 02/22/2016.   Small chronic lacunar infarct within the right basal ganglia, new  from the prior MRI.    Mild-to-moderate for age generalized cerebral atrophy.   PATIENT SURVEYS:  LEFS 36/80 = 45% ability   COGNITION: Overall cognitive status: Within functional limits for tasks assessed and History of cognitive impairments - at baseline     SENSATION: Not tested  EDEMA:  Bil +1 ankle edema  MUSCLE LENGTH: Hamstrings: mild tightness bil   POSTURE: forward head and decreased lumbar lordosis  PALPATION: Tenderness over L hip trochanter, L glut medius and piriforms  LOWER EXTREMITY ROM:  tightness L hip flexion, to neutral.  Good hip mobility for ER and IR bil, symmetric.     LOWER EXTREMITY MMT:  MMT Right eval Left eval  Hip flexion 5 5  Hip extension 4 4  Hip abduction 5 5  Hip adduction 5 5  Knee flexion 5 5  Knee extension 5 5  Ankle dorsiflexion 4 4  Ankle plantarflexion 4 4   (Blank rows = not tested)  LOWER EXTREMITY SPECIAL TESTS:  NT  FUNCTIONAL TESTS:  5 times sit to stand: 13.7 seconds - without UE assist.  Table height 20 inches (to place hips and knees at 90/90) 2 minute walk test: 270' without AD MCTSIB: Condition 1: Avg of 3 trials: 30 sec, Condition 2: Avg of 3 trials: 30 sec, Condition 3: Avg of 3 trials: 30 sec, Condition 4: Avg of 3 trials: 6 sec, and Total Score: 96/120  DGI 13/24 GAIT: Distance walked: 270' Assistive device utilized: None Level of assistance: Complete Independence Comments: gait with forward flexed posture, hip and knee flexion throughout gait cycle, landing flat footed, decreased stride length.  Brace on R knee.  Prefers walking with 2WRW for community ambulation.    Gait speed with RW - 0.4 m/s, without AD 0.68 m/s   TODAY'S TREATMENT:  DATE:  02/25/23 Therapeutic Exercise: to improve strength and mobility.  Demo, verbal and tactile cues throughout for technique.  Nustep L5x71min Counter squats  2x10 Single leg RDL x 10 with support bil Stepping over obstacles along counter 2x fwd and lateral; progressed 2nd trial with 2# weights Step ups x 15 BLE ) 6' Knee flexion 30# 2x10 B Knee extension 15# 2x10 B  02/21/23  TherEx  Nustep L5x6 minutes BLEs only Standing alternating marches 3# each LE x10 B (20 total) Backwards walking 3# BLEs 72ft x6, cues for improved step lengths and upright posture safe speed of movement Hip flexion + horizontal ABD x10 B 3#  Hamstring curls 3# BLEs x12B Sidesteps with 3# BLEs 53ft x6  Walking intervals 3# each LE 147ft   02/18/23  TherEx  Nustep L5 x6 minutes BLEs only   Walking bridges x15  MAX CUES Sidelying hip ABD 2# B x10 MOD CUES/FACILITATION Prone hip extensions 2# x10 B Prone hamstring curls 2# x12 B Sit to stands with blue weighted ball 2x10 focus on eccentric stand to sit LAQs 2# x15 B  Sidesteps along mat table with green TB above knees x4 laps minA for balance Seated marches green TB above knees x15 B STS with LEs through green TB x10  for hip ABD activation, cues for slow eccentric lower          02/12/22 Therapeutic Exercise: to improve strength and mobility.  Demo, verbal and tactile cues throughout for technique. Nustep L5 x 6 min  LE only  Standing toe raises  Standing hip abduction x 10 2# weight Standing hip extension x 10 2# weight Standing hamstring curl x 10 2# weight Seated LAQ 2# 2x15 bil Seated hip up/over bean bag 2# 2x10 Seated heel raises 2# 2 x 10   Manual Therapy: to decrease muscle spasm, pain and improve mobility.  JOINT mobs for R knee grade I-II for pain in sitting  02/07/23 Therapeutic Exercise: to improve strength and mobility.  Demo, verbal and tactile cues throughout for technique. Nustep L5 x 6 min  LE only  At counter with bil UE support for safety: Standing toe raises 2 x 10 Standing hip extension 2  x 10 bil Standing hip abduction 2 x 10 bil  Standing hamstring curls 2 x 10 bil   Standing marches x 10 bil  Seated ball squeeze x 10 Seated step overs 2 x 10 bil Seated LAQ #2 2 x 10 bil  Seated heel raises 2# 2 x 10  Seated marches 2# x 20     02/05/23 Therapeutic Exercise: to improve strength and mobility.  Demo, verbal and tactile cues throughout for technique. Nustep L5 x 6 min  - Toe Raises with Counter Support  - 10 reps - Standing Hip Extension with Counter Support  - 10 reps - Standing Hip Abduction with Counter Support  - 10 reps - Seated Hamstring Stretch  - 3 reps - 30 sec  hold - Seated Long Arc Quad  - 10 reps - Seated Quad Set 10 reps - 5 sec  hold - Seated Step overs - 2 sets - 10 reps Therapeutic Activity:  to assess balance DGI   01/28/23 Self Care: Education on safety and orthostatic hypotension, as well as findings and POC.  Discussed orthotics as well.     PATIENT EDUCATION:  Education details: see self care Person educated: Patient Education method: Explanation and Handouts Education comprehension: verbalized understanding  HOME EXERCISE PROGRAM: Access Code: [email or web address] URL:  [email or web address] Date: 02/05/2023 Prepared by: Harrie Foreman  Exercises - Toe Raises with Counter Support  - 1 x daily - 7 x weekly - 2 sets - 10 reps - Standing Hip Extension with Counter Support  - 1 x daily - 7 x weekly - 2 sets - 10 reps - Standing Hip Abduction with Counter Support  - 1 x daily - 7 x weekly - 2 sets - 10 reps - Seated Hamstring Stretch  - 1 x daily - 7 x weekly - 1 sets - 3 reps - 30 sec  hold - Seated Long Arc Quad  - 1 x daily - 7 x weekly - 2 sets - 10 reps - Seated Quad Set  - 1 x daily - 7 x weekly - 1 sets - 10 reps - 5 sec  hold - Seated March  - 1 x daily - 7 x weekly - 2 sets - 10 reps  Patient Education - Postural Hypotension - Check for Safety  ASSESSMENT:  CLINICAL IMPRESSION: Pt showed a good tolerance for the progression of exercises today. Continued focusing on functional strength and adding in  weight machines. He showed some unsteadiness with the single leg RDL, requiring some CGA and also cues to emphasize the glutes. Able to progress the stepping over obstacles to 2# weights.  OBJECTIVE IMPAIRMENTS: Abnormal gait, decreased activity tolerance, decreased balance, decreased endurance, decreased mobility, difficulty walking, decreased ROM, decreased strength, increased muscle spasms, impaired flexibility, postural dysfunction, and pain.   ACTIVITY LIMITATIONS: carrying, bending, standing, squatting, stairs, and locomotion level  PARTICIPATION LIMITATIONS: cleaning, community activity, and yard work  PERSONAL FACTORS: Age and 3+ comorbidities: A-fib, TIA, Alzheimer's, history of ACDF, back surgery, L great toe arthrodesis, bil TSA, OA multiple sites.   are also affecting patient's functional outcome.   REHAB POTENTIAL: Good  CLINICAL DECISION MAKING: Evolving/moderate complexity  EVALUATION COMPLEXITY: Moderate   GOALS: Goals reviewed with patient? Yes  SHORT TERM GOALS: Target date: 02/11/2023   Patient will be independent with initial HEP. Baseline:  Goal status: IN PROGRESS 02/05/23 given initial HEP  2.  Patient will be educated on strategies to decrease risk of falls.  Baseline: educated and handout handout provided Goal status: MET   LONG TERM GOALS: Target date: 03/25/2023   Patient will be independent with advanced/ongoing HEP to improve outcomes and carryover.  Baseline:  Goal status: IN PROGRESS  2.  Patient will be able to ambulate 600' without increased R hip pain and with good safety to access community.  Baseline: 270' Goal status: IN PROGRESS  3.   Patient will demonstrate improved functional LE strength as demonstrated by 4+/5 hip and ankle strength. Baseline: see objective Goal status: IN PROGRESS  4.  Patient will demonstrate at least 19/24 on DGI to improve gait stability and reduce risk for falls. Baseline: 02/05/23 13/24 Goal status: IN  PROGRESS  5.  Patient will report 9 points improvement on LEFS to demonstrate improved functional ability. Baseline: 36/80 Goal status: IN PROGRESS  6.  Patient will demonstrate gait speed of >0.8 m/s to be a safe community ambulator with decreased risk for recurrent falls.  Baseline: 0.4 with RW, 0.68 m/s without AD Goal status: IN PROGRESS  7.  Patient will report 75% improvement in R hip pain to improve QOL.  Baseline:  Goal status: IN PROGRESS PLAN:   PT FREQUENCY: 1-2x/week  PT DURATION: 8 weeks  PLANNED INTERVENTIONS: Therapeutic exercises, Therapeutic activity, Neuromuscular re-education, Balance training, Gait training, Patient/Family education, Self  Care, Joint mobilization, Stair training, Orthotic/Fit training, Dry Needling, Electrical stimulation, Spinal mobilization, Cryotherapy, Moist heat, Ultrasound, Ionotophoresis 4mg /ml Dexamethasone, Manual therapy, and Re-evaluation  PLAN FOR NEXT SESSION: HEP for hip strengthening, manual therapy for R hip/glutes/piriformis  Verta Ellen, PTA

## 2023-02-28 ENCOUNTER — Ambulatory Visit: Payer: Medicare Other | Attending: Orthopaedic Surgery | Admitting: Physical Therapy

## 2023-02-28 ENCOUNTER — Encounter: Payer: Self-pay | Admitting: Physical Therapy

## 2023-02-28 DIAGNOSIS — G8929 Other chronic pain: Secondary | ICD-10-CM | POA: Diagnosis present

## 2023-02-28 DIAGNOSIS — R2681 Unsteadiness on feet: Secondary | ICD-10-CM

## 2023-02-28 DIAGNOSIS — M25552 Pain in left hip: Secondary | ICD-10-CM

## 2023-02-28 DIAGNOSIS — R262 Difficulty in walking, not elsewhere classified: Secondary | ICD-10-CM

## 2023-02-28 DIAGNOSIS — M25561 Pain in right knee: Secondary | ICD-10-CM | POA: Diagnosis present

## 2023-02-28 DIAGNOSIS — M6281 Muscle weakness (generalized): Secondary | ICD-10-CM | POA: Insufficient documentation

## 2023-02-28 NOTE — Therapy (Signed)
OUTPATIENT PHYSICAL THERAPY TREATMENT   Patient Name: Jose [name]. MRN: [id number] DOB:28-Aug-1936, 87 y.o., male Today's Date: 02/28/2023  END OF SESSION:  PT End of Session - 02/28/23 1405     Visit Number 8    Number of Visits 16    Date for PT Re-Evaluation 03/25/23    Authorization Type Medicare + [employer]    Progress Note Due on Visit 10    PT Start Time 1402    PT Stop Time 1442    PT Time Calculation (min) 40 min    Activity Tolerance Patient tolerated treatment well    Behavior During Therapy WFL for tasks assessed/performed                Past Medical History:  Diagnosis Date   Arthritis    CAD (coronary artery disease)    04/15/18 65% dLAD, 20% pLAD, 30% 2nd diag, Normal EF   Dysrhythmia    afib   Gallstones 07/1996   Headache    couple times a year   History of CT scan of head 1997   small vessel disease   History of ETT 05/2002    no EKG changes   History of MRI of cervical spine 07/15/2002   Hypertension    Lumbar spondylolysis 12/17/2016   MGUS (monoclonal gammopathy of unknown significance) 04/11/2012   Occipital neuralgia of left side 12/09/2018   Peripheral neuropathy    Pneumonia 10/2014   PONV (postoperative nausea and vomiting)    once many years ago   Primary localized osteoarthritis of left hip 07/08/2018   Primary localized osteoarthrosis of left shoulder 12/25/2016   Primary localized osteoarthrosis of right shoulder 11/05/2017   Rectal bleeding 12/2002   colonoscopy   Seizures (HCC)    last seizure 1976   Skin cancer    Sleep apnea    uses CPAP   Spinal stenosis, lumbar region with neurogenic claudication    Stroke [facility])    ?tia ?afib   TIA (transient ischemic attack)    Ulnar neuropathy at elbow 12/24/2014   Left   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 extremity 03/29/2015   Past Surgical History:  Procedure Laterality Date   ANTERIOR CERVICAL DECOMP/DISCECTOMY FUSION N/A 02/04/2015   Procedure: ANTERIOR CERVICAL  DECOMPRESSION/DISCECTOMY FUSION CERVICAL FIVE-SIX,CERVICAL SIX-SEVEN;  Surgeon: Aliene Beams, MD;  Location: [facility];  Service: Neurosurgery;  Laterality: N/A;   arthroscopic knee surgery     BACK SURGERY     CATARACT EXTRACTION Bilateral    COLONOSCOPY W/ POLYPECTOMY  04/19/2003   tubular adenoma   EYE SURGERY     Bilateral Cataracts   GREAT TOE ARTHRODESIS, INTERPHALANGEAL JOINT Left 1978   JOINT REPLACEMENT     joint replacement on right thumb Right    LEFT HEART CATH AND CORONARY ANGIOGRAPHY N/A 04/15/2018   Procedure: LEFT HEART CATH AND CORONARY ANGIOGRAPHY;  Surgeon: Lennette Bihari, MD;  Location: [facility];  Service: Cardiovascular;  Laterality: N/A;   LUMBAR LAMINECTOMY/DECOMPRESSION MICRODISCECTOMY N/A 07/19/2017   Procedure: LAMINECTOMY AND FORAMINOTOMY  LUMBAR TWO- LUMBAR THREE, LUMBAR THREE- LUMBAR FOUR;  Surgeon: Coletta Memos, MD;  Location: [facility];  Service: Neurosurgery;  Laterality: N/A;   nerve transfer surgery to lwft hand Left    Nov. 2017   SHOULDER ARTHROSCOPY W/ ROTATOR CUFF REPAIR Left 2013   rotator cuff    SHOULDER INJECTION  03/2011   left, [place]   SKIN CANCER EXCISION  07/1993   left  lateral arm   SUPERFICIAL KERATECTOMY Right 05/1998   wrist   thumb injection  03/2011   left, [place]   TOE SURGERY Right    TONSILLECTOMY AND ADENOIDECTOMY     TOTAL HIP ARTHROPLASTY Left 07/08/2018   TOTAL HIP ARTHROPLASTY Left 07/08/2018   Procedure: LEFT TOTAL HIP ARTHROPLASTY;  Surgeon: Teryl Lucy, MD;  Location: [facility];  Service: Orthopedics;  Laterality: Left;   TOTAL SHOULDER ARTHROPLASTY Left 12/25/2016   Procedure: TOTAL SHOULDER ARTHROPLASTY;  Surgeon: Teryl Lucy, MD;  Location: [facility];  Service: Orthopedics;  Laterality: Left;   TOTAL SHOULDER ARTHROPLASTY Right 11/05/2017   TOTAL SHOULDER ARTHROPLASTY Right 11/05/2017   Procedure: TOTAL SHOULDER ARTHROPLASTY;  Surgeon: Teryl Lucy, MD;  Location: [facility];  Service: Orthopedics;   Laterality: Right;   ulnar nerve transfer  12/1999   left   ULNAR NERVE TRANSPOSITION Left    Patient Active Problem List   Diagnosis Date Noted   Memory loss 04/18/2021   Occipital neuralgia of left side 12/09/2018   Primary localized osteoarthritis of left hip 07/08/2018   Primary localized osteoarthritis of hip 07/08/2018   CAD (coronary artery disease) 04/16/2018   Primary localized osteoarthrosis of right shoulder 11/05/2017   Primary localized osteoarthrosis of shoulder 11/05/2017   Lumbar stenosis with neurogenic claudication 07/19/2017   Primary localized osteoarthrosis of left shoulder 12/25/2016   S/P shoulder replacement 12/25/2016   Lumbar spondylolysis 12/17/2016   Paresthesia 01/24/2016   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 extremity 03/29/2015   Cervical spinal stenosis 02/04/2015   Bronchiectasis without acute exacerbation (HCC) 01/11/2015   TIA (transient ischemic attack) 12/15/2014   Hyperlipidemia 11/24/2014   PAF (paroxysmal atrial fibrillation) (HCC)    MGUS (monoclonal gammopathy of unknown significance) 04/11/2012   Gout, unspecified 10/04/2009   BPH (benign prostatic hyperplasia) 12/14/2008   ALLERGIC CONJUNCTIVITIS 12/03/2007   TESTOSTERONE DEFICIENCY 11/14/2007   Thoracic back pain 04/22/2007   OBESITY, NOS 12/26/2006   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   THERAPY DIAG:  Unsteadiness on feet  Muscle weakness (generalized)  Pain in left hip  Chronic pain of right knee  Difficulty in walking, not elsewhere classified  Rationale for Evaluation and  Treatment: Rehabilitation  ONSET DATE: ~ 1 year  SUBJECTIVE:   SUBJECTIVE STATEMENT: Knee is a little sore, but overall doing well.  Wheelchair is now "rusting at home" not using anymore.    PERTINENT HISTORY: PMHX significant of a-fib, TIA, Alzheimer's. Recent hospitalization, presented due to left sided weakness.  History of ACDF, back surgery, L great toe arthrodesis, bil TSA, R THA, OA multiple sites.   Neuropathy bil feet.   Per MD notes "Since Discharge he is doing well overall. Wife accompanies him today and aides in hx. He had stopped walking and doing this b.c he thought the ortho.told him if something happened to his rt knee he would down. He became more deconditioned due to this. He is now wearing a knee brace and trying to walk more. Using walking for long distanced. Denies worsening weakness or any current neuro.changes. Wife checking bp , did have fu with cardiology and they want her to  reach out with him numbers over the next week or so to see if any medications needs to be restarted. Vitals look good today in office."  PAIN:  Are you having pain? Yes: NPRS scale: 1/10 Pain location: R knee  Pain description: twinges  Aggravating factors: nothing  Relieving factors: nothing     PRECAUTIONS: None  WEIGHT BEARING RESTRICTIONS: No  FALLS:  Has patient fallen in last 6 months? No  LIVING ENVIRONMENT: Lives with: lives with their spouse Lives in: House/apartment Stairs: Yes: External: 1 steps; none Has following equipment at home: Environmental consultant - 2 wheeled and Wheelchair (power)  OCCUPATION: retired    PLOF: Independent with household mobility with device  PATIENT GOALS: less pain    OBJECTIVE:   DIAGNOSTIC FINDINGS: 03/28/23 MR Brain IMPRESSION:  Mildly motion degraded exam.   No evidence of acute intracranial abnormality.   Mild chronic small vessel image changes within the cerebral white  matter, slightly progressed from the prior MRI of 02/22/2016.   Small  chronic lacunar infarct within the right basal ganglia, new  from the prior MRI.   Mild-to-moderate for age generalized cerebral atrophy.   PATIENT SURVEYS:  LEFS 36/80 = 45% ability   COGNITION: Overall cognitive status: Within functional limits for tasks assessed and History of cognitive impairments - at baseline     SENSATION: Not tested  EDEMA:  Bil +1 ankle edema  MUSCLE LENGTH: Hamstrings: mild tightness bil   POSTURE: forward head and decreased lumbar lordosis  PALPATION: Tenderness over L hip trochanter, L glut medius and piriforms  LOWER EXTREMITY ROM:  tightness L hip flexion, to neutral.  Good hip mobility for ER and IR bil, symmetric.     LOWER EXTREMITY MMT:  MMT Right eval Left eval  Hip flexion 5 5  Hip extension 4 4  Hip abduction 5 5  Hip adduction 5 5  Knee flexion 5 5  Knee extension 5 5  Ankle dorsiflexion 4 4  Ankle plantarflexion 4 4   (Blank rows = not tested)  LOWER EXTREMITY SPECIAL TESTS:  NT  FUNCTIONAL TESTS:  5 times sit to stand: 13.7 seconds - without UE assist.  Table height 20 inches (to place hips and knees at 90/90) 2 minute walk test: 270' without AD MCTSIB: Condition 1: Avg of 3 trials: 30 sec, Condition 2: Avg of 3 trials: 30 sec, Condition 3: Avg of 3 trials: 30 sec, Condition 4: Avg of 3 trials: 6 sec, and Total Score: 96/120  DGI 13/24 GAIT: Distance walked: 270' Assistive device utilized: None Level of assistance: Complete Independence Comments: gait with forward flexed posture, hip and knee flexion throughout gait cycle, landing flat footed, decreased stride length.  Brace on R knee.  Prefers walking with 2WRW for community ambulation.    Gait speed with RW - 0.4 m/s, without AD 0.68 m/s   TODAY'S TREATMENT:  DATE:   02/28/2023 Therapeutic Exercise: to improve strength and mobility.  Demo,  verbal and tactile cues throughout for technique. Nustep L5 x 6 min  Knee flexion 35# 2x10 B cues for eccentric control Knee extension 35# 2x10 B cues for eccentric control Step ups 6" (aerobic step + 1 riser) x 10 bil  Side step ups 6" x 10 bil   02/25/23 Therapeutic Exercise: to improve strength and mobility.  Demo, verbal and tactile cues throughout for technique.  Nustep L5x47min Counter squats 2x10 Single leg RDL x 10 with support bil Stepping over obstacles along counter 2x fwd and lateral; progressed 2nd trial with 2# weights Step ups x 15 BLE ) 6' Knee flexion 30# 2x10 B Knee extension 15# 2x10 B  02/21/23  TherEx  Nustep L5x6 minutes BLEs only Standing alternating marches 3# each LE x10 B (20 total) Backwards walking 3# BLEs 69ft x6, cues for improved step lengths and upright posture safe speed of movement Hip flexion + horizontal ABD x10 B 3#  Hamstring curls 3# BLEs x12B Sidesteps with 3# BLEs 7ft x6  Walking intervals 3# each LE 148ft   02/18/23  TherEx  Nustep L5 x6 minutes BLEs only   Walking bridges x15  MAX CUES Sidelying hip ABD 2# B x10 MOD CUES/FACILITATION Prone hip extensions 2# x10 B Prone hamstring curls 2# x12 B Sit to stands with blue weighted ball 2x10 focus on eccentric stand to sit LAQs 2# x15 B  Sidesteps along mat table with green TB above knees x4 laps minA for balance Seated marches green TB above knees x15 B STS with LEs through green TB x10  for hip ABD activation, cues for slow eccentric lower   PATIENT EDUCATION:  Education details: HEP update Person educated: Patient Education method: Explanation and Handouts Education comprehension: verbalized understanding  HOME EXERCISE PROGRAM: Access Code: [email or web address] URL: [email or web address] Date: 02/05/2023 Prepared by: Harrie Foreman  Exercises - Toe Raises with Counter Support  - 1 x daily - 7 x weekly - 2 sets - 10 reps - Standing Hip Extension with Counter  Support  - 1 x daily - 7 x weekly - 2 sets - 10 reps - Standing Hip Abduction with Counter Support  - 1 x daily - 7 x weekly - 2 sets - 10 reps - Seated Hamstring Stretch  - 1 x daily - 7 x weekly - 1 sets - 3 reps - 30 sec  hold - Seated Long Arc Quad  - 1 x daily - 7 x weekly - 2 sets - 10 reps - Seated Quad Set  - 1 x daily - 7 x weekly - 1 sets - 10 reps - 5 sec  hold - Seated March  - 1 x daily - 7 x weekly - 2 sets - 10 reps  Patient Education - Postural Hypotension - Check for Safety  ASSESSMENT:  CLINICAL IMPRESSION: Again tolerated progression of exercises well.  Reported some R medial knee discomfort with step ups, but when repositioned leg to correct alignment pain improved significantly.  Asked about knee brace he is wearing at home, he is fine to bring to therapy and try exercises with it.  Theora Master. continues to demonstrate potential for improvement and would benefit from continued skilled therapy to address impairments.     OBJECTIVE IMPAIRMENTS: Abnormal gait, decreased activity tolerance, decreased balance, decreased endurance, decreased mobility, difficulty walking, decreased ROM, decreased strength, increased muscle spasms, impaired flexibility, postural dysfunction, and pain.  ACTIVITY LIMITATIONS: carrying, bending, standing, squatting, stairs, and locomotion level  PARTICIPATION LIMITATIONS: cleaning, community activity, and yard work  PERSONAL FACTORS: Age and 3+ comorbidities: A-fib, TIA, Alzheimer's, history of ACDF, back surgery, L great toe arthrodesis, bil TSA, OA multiple sites.   are also affecting patient's functional outcome.   REHAB POTENTIAL: Good  CLINICAL DECISION MAKING: Evolving/moderate complexity  EVALUATION COMPLEXITY: Moderate   GOALS: Goals reviewed with patient? Yes  SHORT TERM GOALS: Target date: 02/11/2023   Patient will be independent with initial HEP. Baseline:  Goal status: MET 02/05/23 given initial HEP 02/28/23- reports  daily compliance  2.  Patient will be educated on strategies to decrease risk of falls.  Baseline: educated and handout handout provided Goal status: MET   LONG TERM GOALS: Target date: 03/25/2023   Patient will be independent with advanced/ongoing HEP to improve outcomes and carryover.  Baseline:  Goal status: IN PROGRESS  2.  Patient will be able to ambulate 600' without increased R hip pain and with good safety to access community.  Baseline: 270' Goal status: IN PROGRESS  3.   Patient will demonstrate improved functional LE strength as demonstrated by 4+/5 hip and ankle strength. Baseline: see objective Goal status: IN PROGRESS  4.  Patient will demonstrate at least 19/24 on DGI to improve gait stability and reduce risk for falls. Baseline: 02/05/23 13/24 Goal status: IN PROGRESS  5.  Patient will report 9 points improvement on LEFS to demonstrate improved functional ability. Baseline: 36/80 Goal status: IN PROGRESS  6.  Patient will demonstrate gait speed of >0.8 m/s to be a safe community ambulator with decreased risk for recurrent falls.  Baseline: 0.4 with RW, 0.68 m/s without AD Goal status: IN PROGRESS  7.  Patient will report 75% improvement in R hip pain to improve QOL.  Baseline:  Goal status: IN PROGRESS PLAN:   PT FREQUENCY: 1-2x/week  PT DURATION: 8 weeks  PLANNED INTERVENTIONS: Therapeutic exercises, Therapeutic activity, Neuromuscular re-education, Balance training, Gait training, Patient/Family education, Self Care, Joint mobilization, Stair training, Orthotic/Fit training, Dry Needling, Electrical stimulation, Spinal mobilization, Cryotherapy, Moist heat, Ultrasound, Ionotophoresis 4mg /ml Dexamethasone, Manual therapy, and Re-evaluation  PLAN FOR NEXT SESSION: HEP for hip strengthening, manual therapy for R hip/glutes/piriformis  Jena Gauss, PT, DPT 2:44 PM 02/28/2023

## 2023-03-04 ENCOUNTER — Encounter: Payer: Medicare Other | Admitting: Physical Therapy

## 2023-03-07 ENCOUNTER — Ambulatory Visit: Payer: Medicare Other

## 2023-03-11 ENCOUNTER — Ambulatory Visit: Payer: Medicare Other

## 2023-03-11 DIAGNOSIS — M25552 Pain in left hip: Secondary | ICD-10-CM

## 2023-03-11 DIAGNOSIS — M6281 Muscle weakness (generalized): Secondary | ICD-10-CM

## 2023-03-11 DIAGNOSIS — R2681 Unsteadiness on feet: Secondary | ICD-10-CM | POA: Diagnosis not present

## 2023-03-11 DIAGNOSIS — R262 Difficulty in walking, not elsewhere classified: Secondary | ICD-10-CM

## 2023-03-11 DIAGNOSIS — G8929 Other chronic pain: Secondary | ICD-10-CM

## 2023-03-11 NOTE — Therapy (Signed)
OUTPATIENT PHYSICAL THERAPY TREATMENT   Patient Name: Jose [name]. MRN: [id number] DOB:1936/08/01, 87 y.o., male Today's Date: 03/11/2023  END OF SESSION:  PT End of Session - 03/11/23 1440     Visit Number 9    Number of Visits 16    Date for PT Re-Evaluation 03/25/23    Authorization Type Medicare + [employer]    Progress Note Due on Visit 10    PT Start Time 1401    PT Stop Time 1443    PT Time Calculation (min) 42 min    Activity Tolerance Patient tolerated treatment well    Behavior During Therapy WFL for tasks assessed/performed                 Past Medical History:  Diagnosis Date   Arthritis    CAD (coronary artery disease)    04/15/18 65% dLAD, 20% pLAD, 30% 2nd diag, Normal EF   Dysrhythmia    afib   Gallstones 07/1996   Headache    couple times a year   History of CT scan of head 1997   small vessel disease   History of ETT 05/2002    no EKG changes   History of MRI of cervical spine 07/15/2002   Hypertension    Lumbar spondylolysis 12/17/2016   MGUS (monoclonal gammopathy of unknown significance) 04/11/2012   Occipital neuralgia of left side 12/09/2018   Peripheral neuropathy    Pneumonia 10/2014   PONV (postoperative nausea and vomiting)    once many years ago   Primary localized osteoarthritis of left hip 07/08/2018   Primary localized osteoarthrosis of left shoulder 12/25/2016   Primary localized osteoarthrosis of right shoulder 11/05/2017   Rectal bleeding 12/2002   colonoscopy   Seizures (HCC)    last seizure 1976   Skin cancer    Sleep apnea    uses CPAP   Spinal stenosis, lumbar region with neurogenic claudication    Stroke [facility])    ?tia ?afib   TIA (transient ischemic attack)    Ulnar neuropathy at elbow 12/24/2014   Left   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 extremity 03/29/2015   Past Surgical History:  Procedure Laterality Date   ANTERIOR CERVICAL DECOMP/DISCECTOMY FUSION N/A 02/04/2015   Procedure: ANTERIOR CERVICAL  DECOMPRESSION/DISCECTOMY FUSION CERVICAL FIVE-SIX,CERVICAL SIX-SEVEN;  Surgeon: Aliene Beams, MD;  Location: [facility] NEURO ORS;  Service: Neurosurgery;  Laterality: N/A;   arthroscopic knee surgery     BACK SURGERY     CATARACT EXTRACTION Bilateral    COLONOSCOPY W/ POLYPECTOMY  04/19/2003   tubular adenoma   EYE SURGERY     Bilateral Cataracts   GREAT TOE ARTHRODESIS, INTERPHALANGEAL JOINT Left 1978   JOINT REPLACEMENT     joint replacement on right thumb Right    LEFT HEART CATH AND CORONARY ANGIOGRAPHY N/A 04/15/2018   Procedure: LEFT HEART CATH AND CORONARY ANGIOGRAPHY;  Surgeon: Lennette Bihari, MD;  Location: [facility] INVASIVE CV LAB;  Service: Cardiovascular;  Laterality: N/A;   LUMBAR LAMINECTOMY/DECOMPRESSION MICRODISCECTOMY N/A 07/19/2017   Procedure: LAMINECTOMY AND FORAMINOTOMY  LUMBAR TWO- LUMBAR THREE, LUMBAR THREE- LUMBAR FOUR;  Surgeon: Coletta Memos, MD;  Location: [facility] OR;  Service: Neurosurgery;  Laterality: N/A;   nerve transfer surgery to lwft hand Left    Nov. 2017   SHOULDER ARTHROSCOPY W/ ROTATOR CUFF REPAIR Left 2013   rotator cuff    SHOULDER INJECTION  03/2011   left, [facility]   SKIN CANCER EXCISION  07/1993  left lateral arm   SUPERFICIAL KERATECTOMY Right 05/1998   wrist   thumb injection  03/2011   left, [place]   TOE SURGERY Right    TONSILLECTOMY AND ADENOIDECTOMY     TOTAL HIP ARTHROPLASTY Left 07/08/2018   TOTAL HIP ARTHROPLASTY Left 07/08/2018   Procedure: LEFT TOTAL HIP ARTHROPLASTY;  Surgeon: Teryl Lucy, MD;  Location: [facility] OR;  Service: Orthopedics;  Laterality: Left;   TOTAL SHOULDER ARTHROPLASTY Left 12/25/2016   Procedure: TOTAL SHOULDER ARTHROPLASTY;  Surgeon: Teryl Lucy, MD;  Location: [facility] OR;  Service: Orthopedics;  Laterality: Left;   TOTAL SHOULDER ARTHROPLASTY Right 11/05/2017   TOTAL SHOULDER ARTHROPLASTY Right 11/05/2017   Procedure: TOTAL SHOULDER ARTHROPLASTY;  Surgeon: Teryl Lucy, MD;  Location: [facility] OR;  Service: Orthopedics;   Laterality: Right;   ulnar nerve transfer  12/1999   left   ULNAR NERVE TRANSPOSITION Left    Patient Active Problem List   Diagnosis Date Noted   Memory loss 04/18/2021   Occipital neuralgia of left side 12/09/2018   Primary localized osteoarthritis of left hip 07/08/2018   Primary localized osteoarthritis of hip 07/08/2018   CAD (coronary artery disease) 04/16/2018   Primary localized osteoarthrosis of right shoulder 11/05/2017   Primary localized osteoarthrosis of shoulder 11/05/2017   Lumbar stenosis with neurogenic claudication 07/19/2017   Primary localized osteoarthrosis of left shoulder 12/25/2016   S/P shoulder replacement 12/25/2016   Lumbar spondylolysis 12/17/2016   Paresthesia 01/24/2016   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 extremity 03/29/2015   Cervical spinal stenosis 02/04/2015   Bronchiectasis without acute exacerbation (HCC) 01/11/2015   TIA (transient ischemic attack) 12/15/2014   Hyperlipidemia 11/24/2014   PAF (paroxysmal atrial fibrillation) (HCC)    MGUS (monoclonal gammopathy of unknown significance) 04/11/2012   Gout, unspecified 10/04/2009   BPH (benign prostatic hyperplasia) 12/14/2008   ALLERGIC CONJUNCTIVITIS 12/03/2007   TESTOSTERONE DEFICIENCY 11/14/2007   Thoracic back pain 04/22/2007   OBESITY, NOS 12/26/2006   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   THERAPY DIAG:  Unsteadiness on feet  Muscle weakness (generalized)  Pain in left hip  Chronic pain of right knee  Difficulty in walking, not elsewhere classified  Rationale for Evaluation and  Treatment: Rehabilitation  ONSET DATE: ~ 1 year  SUBJECTIVE:   SUBJECTIVE STATEMENT: Pt reports a piece of equipment falling on his L foot leaving from the beach. R toes are sore and he also has his knee brace on.   PERTINENT HISTORY: PMHX significant of a-fib, TIA, Alzheimer's. Recent hospitalization, presented due to left sided weakness.  History of ACDF, back surgery, L great toe arthrodesis, bil TSA, R THA, OA multiple sites.   Neuropathy bil feet.   Per MD notes "Since Discharge he is doing well overall. Wife accompanies him today and aides in hx. He had stopped walking and doing this b.c he thought the ortho.told him if something happened to his rt knee he would down. He became more deconditioned due to this. He is now wearing a knee brace and trying to walk more. Using walking for long distanced. Denies worsening weakness or any current neuro.changes. Wife checking bp , did have  fu with cardiology and they want her to reach out with him numbers over the next week or so to see if any medications needs to be restarted. Vitals look good today in office."  PAIN:  Are you having pain? Yes: NPRS scale: 1/10 Pain location: R knee  Pain description: twinges  Aggravating factors: nothing  Relieving factors: nothing     PRECAUTIONS: None  WEIGHT BEARING RESTRICTIONS: No  FALLS:  Has patient fallen in last 6 months? No  LIVING ENVIRONMENT: Lives with: lives with their spouse Lives in: House/apartment Stairs: Yes: External: 1 steps; none Has following equipment at home: Environmental consultant - 2 wheeled and Wheelchair (power)  OCCUPATION: retired    PLOF: Independent with household mobility with device  PATIENT GOALS: less pain    OBJECTIVE:   DIAGNOSTIC FINDINGS: 03/28/23 MR Brain IMPRESSION:  Mildly motion degraded exam.   No evidence of acute intracranial abnormality.   Mild chronic small vessel image changes within the cerebral white  matter, slightly progressed from the prior MRI  of 02/22/2016.   Small chronic lacunar infarct within the right basal ganglia, new  from the prior MRI.   Mild-to-moderate for age generalized cerebral atrophy.   PATIENT SURVEYS:  LEFS 36/80 = 45% ability   COGNITION: Overall cognitive status: Within functional limits for tasks assessed and History of cognitive impairments - at baseline     SENSATION: Not tested  EDEMA:  Bil +1 ankle edema  MUSCLE LENGTH: Hamstrings: mild tightness bil   POSTURE: forward head and decreased lumbar lordosis  PALPATION: Tenderness over L hip trochanter, L glut medius and piriforms  LOWER EXTREMITY ROM:  tightness L hip flexion, to neutral.  Good hip mobility for ER and IR bil, symmetric.     LOWER EXTREMITY MMT:  MMT Right eval Left eval  Hip flexion 5 5  Hip extension 4 4  Hip abduction 5 5  Hip adduction 5 5  Knee flexion 5 5  Knee extension 5 5  Ankle dorsiflexion 4 4  Ankle plantarflexion 4 4   (Blank rows = not tested)  LOWER EXTREMITY SPECIAL TESTS:  NT  FUNCTIONAL TESTS:  5 times sit to stand: 13.7 seconds - without UE assist.  Table height 20 inches (to place hips and knees at 90/90) 2 minute walk test: 270' without AD MCTSIB: Condition 1: Avg of 3 trials: 30 sec, Condition 2: Avg of 3 trials: 30 sec, Condition 3: Avg of 3 trials: 30 sec, Condition 4: Avg of 3 trials: 6 sec, and Total Score: 96/120  DGI 13/24 GAIT: Distance walked: 270' Assistive device utilized: None Level of assistance: Complete Independence Comments: gait with forward flexed posture, hip and knee flexion throughout gait cycle, landing flat footed, decreased stride length.  Brace on R knee.  Prefers walking with 2WRW for community ambulation.    Gait speed with RW - 0.4 m/s, without AD 0.68 m/s   TODAY'S TREATMENT:  DATE:  03/11/2023 Therapeutic Exercise: to improve strength  and mobility.  Demo, verbal and tactile cues throughout for technique. Nustep L5 x 6 min  Lower Extremity Functional Score: 55 / 80 = 68.8 % Seated march 3# 2x10 B Seated LAQ 3# x 10 B Seated ER 3# x 10 B Supine clams green TB x 10 B Supine SLR R/L x 10   02/28/2023 Therapeutic Exercise: to improve strength and mobility.  Demo, verbal and tactile cues throughout for technique. Nustep L5 x 6 min  Knee flexion 35# 2x10 B cues for eccentric control Knee extension 35# 2x10 B cues for eccentric control Step ups 6" (aerobic step + 1 riser) x 10 bil  Side step ups 6" x 10 bil   02/25/23 Therapeutic Exercise: to improve strength and mobility.  Demo, verbal and tactile cues throughout for technique.  Nustep L5x65min Counter squats 2x10 Single leg RDL x 10 with support bil Stepping over obstacles along counter 2x fwd and lateral; progressed 2nd trial with 2# weights Step ups x 15 BLE ) 6' Knee flexion 30# 2x10 B Knee extension 15# 2x10 B  02/21/23  TherEx  Nustep L5x6 minutes BLEs only Standing alternating marches 3# each LE x10 B (20 total) Backwards walking 3# BLEs 62ft x6, cues for improved step lengths and upright posture safe speed of movement Hip flexion + horizontal ABD x10 B 3#  Hamstring curls 3# BLEs x12B Sidesteps with 3# BLEs 62ft x6  Walking intervals 3# each LE 139ft   02/18/23  TherEx  Nustep L5 x6 minutes BLEs only   Walking bridges x15  MAX CUES Sidelying hip ABD 2# B x10 MOD CUES/FACILITATION Prone hip extensions 2# x10 B Prone hamstring curls 2# x12 B Sit to stands with blue weighted ball 2x10 focus on eccentric stand to sit LAQs 2# x15 B  Sidesteps along mat table with green TB above knees x4 laps minA for balance Seated marches green TB above knees x15 B STS with LEs through green TB x10  for hip ABD activation, cues for slow eccentric lower   PATIENT EDUCATION:  Education details: HEP update Person educated: Patient Education method: Explanation and  Handouts Education comprehension: verbalized understanding  HOME EXERCISE PROGRAM: Access Code: [email or web address] URL: [email or web address] Date: 02/05/2023 Prepared by: Harrie Foreman  Exercises - Toe Raises with Counter Support  - 1 x daily - 7 x weekly - 2 sets - 10 reps - Standing Hip Extension with Counter Support  - 1 x daily - 7 x weekly - 2 sets - 10 reps - Standing Hip Abduction with Counter Support  - 1 x daily - 7 x weekly - 2 sets - 10 reps - Seated Hamstring Stretch  - 1 x daily - 7 x weekly - 1 sets - 3 reps - 30 sec  hold - Seated Long Arc Quad  - 1 x daily - 7 x weekly - 2 sets - 10 reps - Seated Quad Set  - 1 x daily - 7 x weekly - 1 sets - 10 reps - 5 sec  hold - Seated March  - 1 x daily - 7 x weekly - 2 sets - 10 reps  Patient Education - Postural Hypotension - Check for Safety  ASSESSMENT:  CLINICAL IMPRESSION: LEFS score has improved by 19 points - meeting LTG #5. Worked on [place] exercises to avoid too much WB on R foot d/t recent incident. His gait reflects shortened strikes and flexed trunk. Will continue to progress strengthening to  improve gait and pain in R knee. Theora Master. continues to demonstrate potential for improvement and would benefit from continued skilled therapy to address impairments.     OBJECTIVE IMPAIRMENTS: Abnormal gait, decreased activity tolerance, decreased balance, decreased endurance, decreased mobility, difficulty walking, decreased ROM, decreased strength, increased muscle spasms, impaired flexibility, postural dysfunction, and pain.   ACTIVITY LIMITATIONS: carrying, bending, standing, squatting, stairs, and locomotion level  PARTICIPATION LIMITATIONS: cleaning, community activity, and yard work  PERSONAL FACTORS: Age and 3+ comorbidities: A-fib, TIA, Alzheimer's, history of ACDF, back surgery, L great toe arthrodesis, bil TSA, OA multiple sites.   are also affecting patient's functional outcome.   REHAB POTENTIAL:  Good  CLINICAL DECISION MAKING: Evolving/moderate complexity  EVALUATION COMPLEXITY: Moderate   GOALS: Goals reviewed with patient? Yes  SHORT TERM GOALS: Target date: 02/11/2023   Patient will be independent with initial HEP. Baseline:  Goal status: MET 02/05/23 given initial HEP 02/28/23- reports daily compliance  2.  Patient will be educated on strategies to decrease risk of falls.  Baseline: educated and handout handout provided Goal status: MET   LONG TERM GOALS: Target date: 03/25/2023   Patient will be independent with advanced/ongoing HEP to improve outcomes and carryover.  Baseline:  Goal status: IN PROGRESS  2.  Patient will be able to ambulate 600' without increased R hip pain and with good safety to access community.  Baseline: 270' Goal status: IN PROGRESS  3.   Patient will demonstrate improved functional LE strength as demonstrated by 4+/5 hip and ankle strength. Baseline: see objective Goal status: IN PROGRESS  4.  Patient will demonstrate at least 19/24 on DGI to improve gait stability and reduce risk for falls. Baseline: 02/05/23 13/24 Goal status: IN PROGRESS  5.  Patient will report 9 points improvement on LEFS to demonstrate improved functional ability. Baseline: 36/80 Goal status: MET- 55/80% LEFS  6.  Patient will demonstrate gait speed of >0.8 m/s to be a safe community ambulator with decreased risk for recurrent falls.  Baseline: 0.4 with RW, 0.68 m/s without AD Goal status: IN PROGRESS  7.  Patient will report 75% improvement in R hip pain to improve QOL.  Baseline:  Goal status: IN PROGRESS PLAN:   PT FREQUENCY: 1-2x/week  PT DURATION: 8 weeks  PLANNED INTERVENTIONS: Therapeutic exercises, Therapeutic activity, Neuromuscular re-education, Balance training, Gait training, Patient/Family education, Self Care, Joint mobilization, Stair training, Orthotic/Fit training, Dry Needling, Electrical stimulation, Spinal mobilization, Cryotherapy, Moist  heat, Ultrasound, Ionotophoresis 4mg /ml Dexamethasone, Manual therapy, and Re-evaluation  PLAN FOR NEXT SESSION: HEP for hip strengthening, manual therapy for R hip/glutes/piriformis  Darleene Cleaver, PTA 2:47 PM 03/11/2023

## 2023-03-13 ENCOUNTER — Ambulatory Visit: Payer: Medicare Other | Admitting: Physical Therapy

## 2023-03-13 ENCOUNTER — Encounter: Payer: Self-pay | Admitting: Physical Therapy

## 2023-03-13 DIAGNOSIS — M6281 Muscle weakness (generalized): Secondary | ICD-10-CM

## 2023-03-13 DIAGNOSIS — M25552 Pain in left hip: Secondary | ICD-10-CM

## 2023-03-13 DIAGNOSIS — R2681 Unsteadiness on feet: Secondary | ICD-10-CM

## 2023-03-13 DIAGNOSIS — G8929 Other chronic pain: Secondary | ICD-10-CM

## 2023-03-13 DIAGNOSIS — R262 Difficulty in walking, not elsewhere classified: Secondary | ICD-10-CM

## 2023-03-13 NOTE — Therapy (Addendum)
OUTPATIENT PHYSICAL THERAPY TREATMENT Progress Note Reporting Period 01/28/2023 to 03/13/2023  See note below for Objective Data and Assessment of Progress/Goals.      Patient Name: Jose [name]. MRN: [id number] DOB:[birth year], 87 y.o., male Today's Date: 03/13/2023  END OF SESSION:  PT End of Session - 03/13/23 1445     Visit Number 10    Number of Visits 16    Date for PT Re-Evaluation 03/25/23    Authorization Type Medicare + [employer]    Progress Note Due on Visit 10    PT Start Time 1445    Activity Tolerance Patient tolerated treatment well    Behavior During Therapy [facility] for tasks assessed/performed                 Past Medical History:  Diagnosis Date   Arthritis    CAD (coronary artery disease)    04/15/18 65% dLAD, 20% pLAD, 30% 2nd diag, Normal EF   Dysrhythmia    afib   Gallstones 07/1996   Headache    couple times a year   History of CT scan of head 1997   small vessel disease   History of ETT 05/2002    no EKG changes   History of MRI of cervical spine 07/15/2002   Hypertension    Lumbar spondylolysis 12/17/2016   MGUS (monoclonal gammopathy of unknown significance) 04/11/2012   Occipital neuralgia of left side 12/09/2018   Peripheral neuropathy    Pneumonia 10/2014   PONV (postoperative nausea and vomiting)    once many years ago   Primary localized osteoarthritis of left hip 07/08/2018   Primary localized osteoarthrosis of left shoulder 12/25/2016   Primary localized osteoarthrosis of right shoulder 11/05/2017   Rectal bleeding 12/2002   colonoscopy   Seizures (HCC)    last seizure 1976   Skin cancer    Sleep apnea    uses CPAP   Spinal stenosis, lumbar region with neurogenic claudication    Stroke [facility])    ?tia ?afib   TIA (transient ischemic attack)    Ulnar neuropathy at elbow 12/24/2014   Left   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 extremity 03/29/2015   Past Surgical History:  Procedure Laterality Date   ANTERIOR CERVICAL  DECOMP/DISCECTOMY FUSION N/A 02/04/2015   Procedure: ANTERIOR CERVICAL DECOMPRESSION/DISCECTOMY FUSION CERVICAL FIVE-SIX,CERVICAL SIX-SEVEN;  Surgeon: Aliene Beams, MD;  Location: [facility] NEURO ORS;  Service: Neurosurgery;  Laterality: N/A;   arthroscopic knee surgery     BACK SURGERY     CATARACT EXTRACTION Bilateral    COLONOSCOPY W/ POLYPECTOMY  04/19/2003   tubular adenoma   EYE SURGERY     Bilateral Cataracts   GREAT TOE ARTHRODESIS, INTERPHALANGEAL JOINT Left 1978   JOINT REPLACEMENT     joint replacement on right thumb Right    LEFT HEART CATH AND CORONARY ANGIOGRAPHY N/A 04/15/2018   Procedure: LEFT HEART CATH AND CORONARY ANGIOGRAPHY;  Surgeon: Lennette Bihari, MD;  Location: [facility] INVASIVE CV LAB;  Service: Cardiovascular;  Laterality: N/A;   LUMBAR LAMINECTOMY/DECOMPRESSION MICRODISCECTOMY N/A 07/19/2017   Procedure: LAMINECTOMY AND FORAMINOTOMY  LUMBAR TWO- LUMBAR THREE, LUMBAR THREE- LUMBAR FOUR;  Surgeon: Coletta Memos, MD;  Location: [facility] OR;  Service: Neurosurgery;  Laterality: N/A;   nerve transfer surgery to lwft hand Left    Nov. 2017   SHOULDER ARTHROSCOPY W/ ROTATOR CUFF REPAIR Left 2013   rotator cuff    SHOULDER INJECTION  03/2011   left, [facility]   SKIN CANCER  EXCISION  07/1993   left lateral arm   SUPERFICIAL KERATECTOMY Right 05/1998   wrist   thumb injection  03/2011   left, [facility]   TOE SURGERY Right    TONSILLECTOMY AND ADENOIDECTOMY     TOTAL HIP ARTHROPLASTY Left 07/08/2018   TOTAL HIP ARTHROPLASTY Left 07/08/2018   Procedure: LEFT TOTAL HIP ARTHROPLASTY;  Surgeon: Teryl Lucy, MD;  Location: [facility] OR;  Service: Orthopedics;  Laterality: Left;   TOTAL SHOULDER ARTHROPLASTY Left 12/25/2016   Procedure: TOTAL SHOULDER ARTHROPLASTY;  Surgeon: Teryl Lucy, MD;  Location: [facility] OR;  Service: Orthopedics;  Laterality: Left;   TOTAL SHOULDER ARTHROPLASTY Right 11/05/2017   TOTAL SHOULDER ARTHROPLASTY Right 11/05/2017   Procedure: TOTAL SHOULDER ARTHROPLASTY;  Surgeon:  Teryl Lucy, MD;  Location: [facility] OR;  Service: Orthopedics;  Laterality: Right;   ulnar nerve transfer  12/1999   left   ULNAR NERVE TRANSPOSITION Left    Patient Active Problem List   Diagnosis Date Noted   Memory loss 04/18/2021   Occipital neuralgia of left side 12/09/2018   Primary localized osteoarthritis of left hip 07/08/2018   Primary localized osteoarthritis of hip 07/08/2018   CAD (coronary artery disease) 04/16/2018   Primary localized osteoarthrosis of right shoulder 11/05/2017   Primary localized osteoarthrosis of shoulder 11/05/2017   Lumbar stenosis with neurogenic claudication 07/19/2017   Primary localized osteoarthrosis of left shoulder 12/25/2016   S/P shoulder replacement 12/25/2016   Lumbar spondylolysis 12/17/2016   Paresthesia 01/24/2016   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 extremity 03/29/2015   Cervical spinal stenosis 02/04/2015   Bronchiectasis without acute exacerbation (HCC) 01/11/2015   TIA (transient ischemic attack) 12/15/2014   Hyperlipidemia 11/24/2014   PAF (paroxysmal atrial fibrillation) (HCC)    MGUS (monoclonal gammopathy of unknown significance) 04/11/2012   Gout, unspecified 10/04/2009   BPH (benign prostatic hyperplasia) 12/14/2008   ALLERGIC CONJUNCTIVITIS 12/03/2007   TESTOSTERONE DEFICIENCY 11/14/2007   Thoracic back pain 04/22/2007   OBESITY, NOS 12/26/2006   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   THERAPY DIAG:  Unsteadiness on feet  Muscle weakness (generalized)  Pain in left hip  Chronic pain of right knee  Difficulty in  walking, not elsewhere classified  Rationale for Evaluation and Treatment: Rehabilitation  ONSET DATE: ~ 1 year  SUBJECTIVE:   SUBJECTIVE STATEMENT: Pt reports R knee a little sore last couple days, not sure why.  Also reporting a little dizzy today.   Says L hip is good, but then says he thinks he needs a knee replacement.   PERTINENT HISTORY: PMHX significant of a-fib, TIA, Alzheimer's. Recent hospitalization, presented due to left sided weakness.  History of ACDF, back surgery, L great toe arthrodesis, bil TSA, R THA, OA multiple sites.   Neuropathy bil feet.   Per MD notes "Since Discharge he is doing well overall. Wife accompanies him today and aides in hx. He had stopped walking and doing this b.c he thought the ortho.told him if something happened to his rt knee he would down. He became more deconditioned due to this. He is now wearing a knee brace and trying to walk more. Using walking for  long distanced. Denies worsening weakness or any current neuro.changes. Wife checking bp , did have fu with cardiology and they want her to reach out with him numbers over the next week or so to see if any medications needs to be restarted. Vitals look good today in office."  PAIN:  Are you having pain? Yes: NPRS scale: 3-4/10 Pain location: R knee   Are you having pain? Yes: NPRS scale: 5/10 Pain location: L hip   PRECAUTIONS: None  WEIGHT BEARING RESTRICTIONS: No  FALLS:  Has patient fallen in last 6 months? No  LIVING ENVIRONMENT: Lives with: lives with their spouse Lives in: House/apartment Stairs: Yes: External: 1 steps; none Has following equipment at home: Environmental consultant - 2 wheeled and Wheelchair (power)  OCCUPATION: retired    PLOF: Independent with household mobility with device  PATIENT GOALS: less pain    OBJECTIVE:   DIAGNOSTIC FINDINGS: 03/28/23 MR Brain IMPRESSION:  Mildly motion degraded exam.   No evidence of acute intracranial abnormality.   Mild chronic small  vessel image changes within the cerebral white  matter, slightly progressed from the prior MRI of 02/22/2016.   Small chronic lacunar infarct within the right basal ganglia, new  from the prior MRI.   Mild-to-moderate for age generalized cerebral atrophy.   PATIENT SURVEYS:  LEFS 36/80 = 45% ability   COGNITION: Overall cognitive status: Within functional limits for tasks assessed and History of cognitive impairments - at baseline     SENSATION: Not tested  EDEMA:  Bil +1 ankle edema  MUSCLE LENGTH: Hamstrings: mild tightness bil   POSTURE: forward head and decreased lumbar lordosis  PALPATION: Tenderness over L hip trochanter, L glut medius and piriforms  LOWER EXTREMITY ROM:  tightness L hip flexion, to neutral.  Good hip mobility for ER and IR bil, symmetric.     LOWER EXTREMITY MMT:  MMT Right eval Left eval  Hip flexion 5 5  Hip extension 4 4  Hip abduction 5 5  Hip adduction 5 5  Knee flexion 5 5  Knee extension 5 5  Ankle dorsiflexion 4 4  Ankle plantarflexion 4 4   (Blank rows = not tested)  LOWER EXTREMITY SPECIAL TESTS:  NT  FUNCTIONAL TESTS:  5 times sit to stand: 13.7 seconds - without UE assist.  Table height 20 inches (to place hips and knees at 90/90) 2 minute walk test: 270' without AD MCTSIB: Condition 1: Avg of 3 trials: 30 sec, Condition 2: Avg of 3 trials: 30 sec, Condition 3: Avg of 3 trials: 30 sec, Condition 4: Avg of 3 trials: 6 sec, and Total Score: 96/120  DGI 13/24 GAIT: Distance walked: 270' Assistive device utilized: None Level of assistance: Complete Independence Comments: gait with forward flexed posture, hip and knee flexion throughout gait cycle, landing flat footed, decreased stride length.  Brace on R knee.  Prefers walking with 2WRW for community ambulation.    Gait speed with RW - 0.4 m/s, without AD 0.68 m/s   TODAY'S TREATMENT:  DATE:   03/13/23 Therapeutic Activity:  to assess progress towards goals.  Vitals: HR 70, O2 97 BP 140/90  Gait 600' MMT Therapeutic Exercise: to improve strength and mobility.  Demo, verbal and tactile cues throughout for technique. Knee extension 25# x 15  Knee flexion 25# x 15 Side stepping at counter 4 x 8' bil  Retro walking 1 hand on counter /SBA 4 x 8' bil  Tandem walking 1 hand out counter/ SBA 6 x 8' Review of HEP and importance.    03/11/2023 Therapeutic Exercise: to improve strength and mobility.  Demo, verbal and tactile cues throughout for technique. Nustep L5 x 6 min  Lower Extremity Functional Score: 55 / 80 = 68.8 % Seated march 3# 2x10 B Seated LAQ 3# x 10 B Seated ER 3# x 10 B Supine clams green TB x 10 B Supine SLR R/L x 10   02/28/2023 Therapeutic Exercise: to improve strength and mobility.  Demo, verbal and tactile cues throughout for technique. Nustep L5 x 6 min  Knee flexion 35# 2x10 B cues for eccentric control Knee extension 35# 2x10 B cues for eccentric control Step ups 6" (aerobic step + 1 riser) x 10 bil  Side step ups 6" x 10 bil     PATIENT EDUCATION:  Education details: HEP review Person educated: Patient Education method: Explanation and Handouts Education comprehension: verbalized understanding  HOME EXERCISE PROGRAM: Access Code: [email or web address] URL: [email or web address] Date: 02/05/2023 Prepared by: Harrie Foreman  Exercises - Toe Raises with Counter Support  - 1 x daily - 7 x weekly - 2 sets - 10 reps - Standing Hip Extension with Counter Support  - 1 x daily - 7 x weekly - 2 sets - 10 reps - Standing Hip Abduction with Counter Support  - 1 x daily - 7 x weekly - 2 sets - 10 reps - Seated Hamstring Stretch  - 1 x daily - 7 x weekly - 1 sets - 3 reps - 30 sec  hold - Seated Long Arc Quad  - 1 x daily - 7 x weekly - 2 sets - 10 reps - Seated Quad Set  - 1 x daily - 7 x weekly - 1 sets - 10  reps - 5 sec  hold - Seated March  - 1 x daily - 7 x weekly - 2 sets - 10 reps  Patient Education - Postural Hypotension - Check for Safety  ASSESSMENT:  CLINICAL IMPRESSION: Today assessed goals and progress.  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  He feels that because of early joint damage that joint replacement is inevitable.  We discussed that just like it takes him longer to recover from the physical exertion from his [facility] trip last week, he would also have longer recovery from surgery at his age, and that strengthening the muscles may help him avoid surgery, and important to continue exercising and strengthening.  He does demonstrate improved strength, meeting LTG #3 today.  He is making progress towards remaining goals, able to walk faster without AD, and walked 600' today without complaint of increased pain.  He is inconsistent with his HEP, so discussed aquatic aerobic program at [facility] since it has an arthritis certified pool with him and wife as activity to transition to maintain gains with less pressure on R knee and foot.  Theora Master. continues to demonstrate potential for improvement and would benefit from continued skilled therapy to address impairments.     OBJECTIVE IMPAIRMENTS: Abnormal gait, decreased activity tolerance, decreased balance, decreased endurance, decreased mobility, difficulty walking, decreased ROM, decreased strength, increased muscle spasms, impaired flexibility, postural dysfunction, and pain.   ACTIVITY LIMITATIONS: carrying, bending, standing, squatting, stairs, and locomotion level  PARTICIPATION LIMITATIONS: cleaning, community activity, and yard work  PERSONAL FACTORS: Age and 3+ comorbidities: A-fib, TIA, Alzheimer's, history of ACDF, back surgery, L great toe arthrodesis, bil TSA, OA  multiple sites.   are also affecting patient's functional outcome.   REHAB POTENTIAL: Good  CLINICAL DECISION MAKING: Evolving/moderate complexity  EVALUATION COMPLEXITY: Moderate   GOALS: Goals reviewed with patient? Yes  SHORT TERM GOALS: Target date: 02/11/2023   Patient will be independent with initial HEP. Baseline:  Goal status: MET 02/05/23 given initial HEP 02/28/23- reports daily compliance  2.  Patient will be educated on strategies to decrease risk of falls.  Baseline: educated and handout handout provided Goal status: MET   LONG TERM GOALS: Target date: 03/25/2023   Patient will be independent with advanced/ongoing HEP to improve outcomes and carryover.  Baseline:  Goal status: IN PROGRESS 03/13/23- doing "some" exercises, but hasn't done any this week   2.  Patient will be able to ambulate 600' without increased R hip pain and with good safety to access community.  Baseline: 270' Goal status: IN PROGRESS 03/13/23- 600', initially said that "hips were good", but when directly asked reported 5/10 L hip pain.   3.   Patient will demonstrate improved functional LE strength as demonstrated by 4+/5 hip and ankle strength. Baseline: see objective Goal status: MET 03/13/23-  4+/5 hip extension, ankle DF/PF strength, tested in sitting due to foot injury.   4.  Patient will demonstrate at least 19/24 on DGI to improve gait stability and reduce risk for falls. Baseline: 02/05/23 13/24 Goal status: IN PROGRESS  5.  Patient will report 9 points improvement on LEFS to demonstrate improved functional ability. Baseline: 36/80 Goal status: MET- 55/80% LEFS  6.  Patient will demonstrate gait speed of >0.8 m/s to be a safe community ambulator with decreased risk for recurrent falls.  Baseline: 0.4 with RW, 0.68 m/s without AD Goal status: IN PROGRESS 03/13/23- 0.79m/s typical, 1.0 m/s when asked to walk faster.   7.  Patient will report 75% improvement in L hip pain to improve QOL.   Baseline:  Goal status: IN PROGRESS 03/13/23- 5/10 L hip pain.  PLAN:   PT FREQUENCY: 1-2x/week  PT DURATION: 8 weeks  PLANNED INTERVENTIONS: Therapeutic exercises, Therapeutic activity, Neuromuscular re-education, Balance training, Gait training, Patient/Family education, Self Care, Joint mobilization, Stair training, Orthotic/Fit training, Dry Needling, Electrical stimulation, Spinal mobilization, Cryotherapy, Moist heat, Ultrasound, Ionotophoresis 4mg /ml Dexamethasone, Manual therapy, and Re-evaluation  PLAN FOR NEXT SESSION: HEP for hip strengthening, manual therapy for R hip/glutes/piriformis  Jena Gauss, PT, DPT  2:45 PM 03/13/2023

## 2023-03-18 ENCOUNTER — Encounter: Payer: Self-pay | Admitting: Physical Therapy

## 2023-03-18 ENCOUNTER — Ambulatory Visit: Payer: Medicare Other | Admitting: Physical Therapy

## 2023-03-18 DIAGNOSIS — M25552 Pain in left hip: Secondary | ICD-10-CM

## 2023-03-18 DIAGNOSIS — M6281 Muscle weakness (generalized): Secondary | ICD-10-CM

## 2023-03-18 DIAGNOSIS — R262 Difficulty in walking, not elsewhere classified: Secondary | ICD-10-CM

## 2023-03-18 DIAGNOSIS — R2681 Unsteadiness on feet: Secondary | ICD-10-CM

## 2023-03-18 DIAGNOSIS — G8929 Other chronic pain: Secondary | ICD-10-CM

## 2023-03-18 NOTE — Therapy (Signed)
OUTPATIENT PHYSICAL THERAPY TREATMENT  Patient Name: Jose [name]. MRN: [id number] DOB:August 21, 1936, 87 y.o., male Today's Date: 03/18/2023  END OF SESSION:  PT End of Session - 03/18/23 1401     Visit Number 11    Number of Visits 16    Date for PT Re-Evaluation 03/25/23    Authorization Type Medicare + [employer]    Progress Note Due on Visit --    PT Start Time 1401    PT Stop Time 1441    PT Time Calculation (min) 40 min    Activity Tolerance Patient tolerated treatment well    Behavior During Therapy WFL for tasks assessed/performed                 Past Medical History:  Diagnosis Date   Arthritis    CAD (coronary artery disease)    04/15/18 65% dLAD, 20% pLAD, 30% 2nd diag, Normal EF   Dysrhythmia    afib   Gallstones 07/1996   Headache    couple times a year   History of CT scan of head 1997   small vessel disease   History of ETT 05/2002    no EKG changes   History of MRI of cervical spine 07/15/2002   Hypertension    Lumbar spondylolysis 12/17/2016   MGUS (monoclonal gammopathy of unknown significance) 04/11/2012   Occipital neuralgia of left side 12/09/2018   Peripheral neuropathy    Pneumonia 10/2014   PONV (postoperative nausea and vomiting)    once many years ago   Primary localized osteoarthritis of left hip 07/08/2018   Primary localized osteoarthrosis of left shoulder 12/25/2016   Primary localized osteoarthrosis of right shoulder 11/05/2017   Rectal bleeding 12/2002   colonoscopy   Seizures (HCC)    last seizure 1976   Skin cancer    Sleep apnea    uses CPAP   Spinal stenosis, lumbar region with neurogenic claudication    Stroke [facility])    ?tia ?afib   TIA (transient ischemic attack)    Ulnar neuropathy at elbow 12/24/2014   Left   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 extremity 03/29/2015   Past Surgical History:  Procedure Laterality Date   ANTERIOR CERVICAL DECOMP/DISCECTOMY FUSION N/A 02/04/2015   Procedure: ANTERIOR CERVICAL  DECOMPRESSION/DISCECTOMY FUSION CERVICAL FIVE-SIX,CERVICAL SIX-SEVEN;  Surgeon: Aliene Beams, MD;  Location: [facility];  Service: Neurosurgery;  Laterality: N/A;   arthroscopic knee surgery     BACK SURGERY     CATARACT EXTRACTION Bilateral    COLONOSCOPY W/ POLYPECTOMY  04/19/2003   tubular adenoma   EYE SURGERY     Bilateral Cataracts   GREAT TOE ARTHRODESIS, INTERPHALANGEAL JOINT Left 1978   JOINT REPLACEMENT     joint replacement on right thumb Right    LEFT HEART CATH AND CORONARY ANGIOGRAPHY N/A 04/15/2018   Procedure: LEFT HEART CATH AND CORONARY ANGIOGRAPHY;  Surgeon: Lennette Bihari, MD;  Location: [facility] IN[facility];  Service: Cardiovascular;  Laterality: N/A;   LUMBAR LAMINECTOMY/DECOMPRESSION MICRODISCECTOMY N/A 07/19/2017   Procedure: LAMINECTOMY AND FORAMINOTOMY  LUMBAR TWO- LUMBAR THREE, LUMBAR THREE- LUMBAR FOUR;  Surgeon: Coletta Memos, MD;  Location: [facility] OR;  Service: Neurosurgery;  Laterality: N/A;   nerve transfer surgery to lwft hand Left    Nov. 2017   SHOULDER ARTHROSCOPY W/ ROTATOR CUFF REPAIR Left 2013   rotator cuff    SHOULDER INJECTION  03/2011   left, [place]   SKIN CANCER EXCISION  07/1993   left  lateral arm   SUPERFICIAL KERATECTOMY Right 05/1998   wrist   thumb injection  03/2011   left, [place]   TOE SURGERY Right    TONSILLECTOMY AND ADENOIDECTOMY     TOTAL HIP ARTHROPLASTY Left 07/08/2018   TOTAL HIP ARTHROPLASTY Left 07/08/2018   Procedure: LEFT TOTAL HIP ARTHROPLASTY;  Surgeon: Teryl Lucy, MD;  Location: [facility] OR;  Service: Orthopedics;  Laterality: Left;   TOTAL SHOULDER ARTHROPLASTY Left 12/25/2016   Procedure: TOTAL SHOULDER ARTHROPLASTY;  Surgeon: Teryl Lucy, MD;  Location: [facility] OR;  Service: Orthopedics;  Laterality: Left;   TOTAL SHOULDER ARTHROPLASTY Right 11/05/2017   TOTAL SHOULDER ARTHROPLASTY Right 11/05/2017   Procedure: TOTAL SHOULDER ARTHROPLASTY;  Surgeon: Teryl Lucy, MD;  Location: [facility] OR;  Service: Orthopedics;   Laterality: Right;   ulnar nerve transfer  12/1999   left   ULNAR NERVE TRANSPOSITION Left    Patient Active Problem List   Diagnosis Date Noted   Memory loss 04/18/2021   Occipital neuralgia of left side 12/09/2018   Primary localized osteoarthritis of left hip 07/08/2018   Primary localized osteoarthritis of hip 07/08/2018   CAD (coronary artery disease) 04/16/2018   Primary localized osteoarthrosis of right shoulder 11/05/2017   Primary localized osteoarthrosis of shoulder 11/05/2017   Lumbar stenosis with neurogenic claudication 07/19/2017   Primary localized osteoarthrosis of left shoulder 12/25/2016   S/P shoulder replacement 12/25/2016   Lumbar spondylolysis 12/17/2016   Paresthesia 01/24/2016   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 extremity 03/29/2015   Cervical spinal stenosis 02/04/2015   Bronchiectasis without acute exacerbation (HCC) 01/11/2015   TIA (transient ischemic attack) 12/15/2014   Hyperlipidemia 11/24/2014   PAF (paroxysmal atrial fibrillation) (HCC)    MGUS (monoclonal gammopathy of unknown significance) 04/11/2012   Gout, unspecified 10/04/2009   BPH (benign prostatic hyperplasia) 12/14/2008   ALLERGIC CONJUNCTIVITIS 12/03/2007   TESTOSTERONE DEFICIENCY 11/14/2007   Thoracic back pain 04/22/2007   OBESITY, NOS 12/26/2006   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   THERAPY DIAG:  Unsteadiness on feet  Muscle weakness (generalized)  Pain in left hip  Chronic pain of right knee  Difficulty in walking, not elsewhere classified  Rationale for Evaluation and  Treatment: Rehabilitation  ONSET DATE: ~ 1 year  SUBJECTIVE:   SUBJECTIVE STATEMENT: Knees doing ok today, hip still hurts when he walks, other than that doing alright. Wearing his knee brace today.   Trying to get through summer vacations before getting knee replaced.   Saw foot doctor today, took x-rays, toes just bruised from dropping cooler on them at [employer].   PERTINENT HISTORY: PMHX significant of a-fib, TIA, Alzheimer's. Recent hospitalization, presented due to left sided weakness.  History of ACDF, back surgery, L great toe arthrodesis, bil TSA, R THA, OA multiple sites.   Neuropathy bil feet.   Per MD notes "Since Discharge he is doing well overall. Wife accompanies him today and aides in hx. He had stopped walking and doing this b.c he thought the ortho.told him if something happened to his rt knee he would down. He became more deconditioned due to this. He is now wearing a knee brace  and trying to walk more. Using walking for long distanced. Denies worsening weakness or any current neuro.changes. Wife checking bp , did have fu with cardiology and they want her to reach out with him numbers over the next week or so to see if any medications needs to be restarted. Vitals look good today in office."  PAIN:  Are you having pain? Yes: NPRS scale: 2-3/10 Pain location: R knee   Are you having pain? Yes: NPRS scale: 2-3/10 Pain location: L hip   PRECAUTIONS: None  WEIGHT BEARING RESTRICTIONS: No  FALLS:  Has patient fallen in last 6 months? No  LIVING ENVIRONMENT: Lives with: lives with their spouse Lives in: House/apartment Stairs: Yes: External: 1 steps; none Has following equipment at home: Environmental consultant - 2 wheeled and Wheelchair (power)  OCCUPATION: retired    PLOF: Independent with household mobility with device  PATIENT GOALS: less pain    OBJECTIVE:   DIAGNOSTIC FINDINGS: 03/28/23 MR Brain IMPRESSION:  Mildly motion degraded exam.   No evidence of acute  intracranial abnormality.   Mild chronic small vessel image changes within the cerebral white  matter, slightly progressed from the prior MRI of 02/22/2016.   Small chronic lacunar infarct within the right basal ganglia, new  from the prior MRI.   Mild-to-moderate for age generalized cerebral atrophy.   PATIENT SURVEYS:  LEFS 36/80 = 45% ability   COGNITION: Overall cognitive status: Within functional limits for tasks assessed and History of cognitive impairments - at baseline     SENSATION: Not tested  EDEMA:  Bil +1 ankle edema  MUSCLE LENGTH: Hamstrings: mild tightness bil   POSTURE: forward head and decreased lumbar lordosis  PALPATION: Tenderness over L hip trochanter, L glut medius and piriforms  LOWER EXTREMITY ROM:  tightness L hip flexion, to neutral.  Good hip mobility for ER and IR bil, symmetric.     LOWER EXTREMITY MMT:  MMT Right eval Left eval  Hip flexion 5 5  Hip extension 4 4  Hip abduction 5 5  Hip adduction 5 5  Knee flexion 5 5  Knee extension 5 5  Ankle dorsiflexion 4 4  Ankle plantarflexion 4 4   (Blank rows = not tested)  LOWER EXTREMITY SPECIAL TESTS:  NT  FUNCTIONAL TESTS:  5 times sit to stand: 13.7 seconds - without UE assist.  Table height 20 inches (to place hips and knees at 90/90) 2 minute walk test: 270' without AD MCTSIB: Condition 1: Avg of 3 trials: 30 sec, Condition 2: Avg of 3 trials: 30 sec, Condition 3: Avg of 3 trials: 30 sec, Condition 4: Avg of 3 trials: 6 sec, and Total Score: 96/120  DGI 13/24 GAIT: Distance walked: 270' Assistive device utilized: None Level of assistance: Complete Independence Comments: gait with forward flexed posture, hip and knee flexion throughout gait cycle, landing flat footed, decreased stride length.  Brace on R knee.  Prefers walking with 2WRW for community ambulation.    Gait speed with RW - 0.4 m/s, without AD 0.68 m/s   TODAY'S TREATMENT:  DATE:   03/18/23 Therapeutic Exercise: to improve strength and mobility.  Demo, verbal and tactile cues throughout for technique. Nustep L5 x 6 min  Knee extension 30# 2 x 10, 35# 1 x 10 Knee flexion 35# 3 x 10  Supine bridges 3 x 10 Supine SLR 2 x 10 bil  Prone knee extension 2 x 10 bil  S/L clams 2 x 10 bil    03/13/23 Therapeutic Activity:  to assess progress towards goals.  Vitals: HR 70, O2 97 BP 140/90  Gait 600' MMT Therapeutic Exercise: to improve strength and mobility.  Demo, verbal and tactile cues throughout for technique. Knee extension 25# x 15  Knee flexion 25# x 15 Side stepping at counter 4 x 8' bil  Retro walking 1 hand on counter /SBA 4 x 8' bil  Tandem walking 1 hand out counter/ SBA 6 x 8' Review of HEP and importance.    03/11/2023 Therapeutic Exercise: to improve strength and mobility.  Demo, verbal and tactile cues throughout for technique. Nustep L5 x 6 min  Lower Extremity Functional Score: 55 / 80 = 68.8 % Seated march 3# 2x10 B Seated LAQ 3# x 10 B Seated ER 3# x 10 B Supine clams green TB x 10 B Supine SLR R/L x 10   02/28/2023 Therapeutic Exercise: to improve strength and mobility.  Demo, verbal and tactile cues throughout for technique. Nustep L5 x 6 min  Knee flexion 35# 2x10 B cues for eccentric control Knee extension 35# 2x10 B cues for eccentric control Step ups 6" (aerobic step + 1 riser) x 10 bil  Side step ups 6" x 10 bil     PATIENT EDUCATION:  Education details: HEP update Person educated: Patient Education method: Explanation and Handouts Education comprehension: verbalized understanding and returned demonstration  HOME EXERCISE PROGRAM: Access Code: [email or web address] URL: [email or web address] Date: 03/18/2023 Prepared by: Harrie Foreman  Exercises - Toe Raises with Counter Support  - 1 x daily - 7 x weekly - 2 sets - 10 reps -  Standing Hip Extension with Counter Support  - 1 x daily - 7 x weekly - 2 sets - 10 reps - Standing Hip Abduction with Counter Support  - 1 x daily - 7 x weekly - 2 sets - 10 reps - Seated Hamstring Stretch  - 1 x daily - 7 x weekly - 1 sets - 3 reps - 30 sec  hold - Seated Long Arc Quad  - 1 x daily - 7 x weekly - 2 sets - 10 reps - Seated Quad Set  - 1 x daily - 7 x weekly - 1 sets - 10 reps - 5 sec  hold - Seated March  - 1 x daily - 7 x weekly - 2 sets - 10 reps - Mini Squat with Counter Support  - 1 x daily - 7 x weekly - 2-3 sets - 10 reps - 53 sec  hold - Supine Bridge  - 1 x daily - 7 x weekly - 2-3 sets - 10 reps - Supine Active Straight Leg Raise  - 1 x daily - 7 x weekly - 2-3 sets - 10 reps - Clamshell  - 1 x daily - 7 x weekly - 2-3 sets - 10 reps - Prone Hip Extension  - 1 x daily - 7 x weekly - 2-3 sets - 10 reps  Patient Education - Postural Hypotension - Check for Safety  ASSESSMENT:  CLINICAL IMPRESSION: Today progressed exercises, focusing more on  hip strengthening, also increasing resistance with weight machines, tolerated well.  HEP updated. Theora Master. continues to demonstrate potential for improvement and would benefit from continued skilled therapy to address impairments.     OBJECTIVE IMPAIRMENTS: Abnormal gait, decreased activity tolerance, decreased balance, decreased endurance, decreased mobility, difficulty walking, decreased ROM, decreased strength, increased muscle spasms, impaired flexibility, postural dysfunction, and pain.   ACTIVITY LIMITATIONS: carrying, bending, standing, squatting, stairs, and locomotion level  PARTICIPATION LIMITATIONS: cleaning, community activity, and yard work  PERSONAL FACTORS: Age and 3+ comorbidities: A-fib, TIA, Alzheimer's, history of ACDF, back surgery, L great toe arthrodesis, bil TSA, OA multiple sites.   are also affecting patient's functional outcome.   REHAB POTENTIAL: Good  CLINICAL DECISION MAKING:  Evolving/moderate complexity  EVALUATION COMPLEXITY: Moderate   GOALS: Goals reviewed with patient? Yes  SHORT TERM GOALS: Target date: 02/11/2023   Patient will be independent with initial HEP. Baseline:  Goal status: MET 02/05/23 given initial HEP 02/28/23- reports daily compliance  2.  Patient will be educated on strategies to decrease risk of falls.  Baseline: educated and handout handout provided Goal status: MET   LONG TERM GOALS: Target date: 03/25/2023   Patient will be independent with advanced/ongoing HEP to improve outcomes and carryover.  Baseline:  Goal status: IN PROGRESS 03/13/23- doing "some" exercises, but hasn't done any this week   2.  Patient will be able to ambulate 600' without increased R hip pain and with good safety to access community.  Baseline: 270' Goal status: IN PROGRESS 03/13/23- 600', initially said that "hips were good", but when directly asked reported 5/10 L hip pain.   3.   Patient will demonstrate improved functional LE strength as demonstrated by 4+/5 hip and ankle strength. Baseline: see objective Goal status: MET 03/13/23-  4+/5 hip extension, ankle DF/PF strength, tested in sitting due to foot injury.   4.  Patient will demonstrate at least 19/24 on DGI to improve gait stability and reduce risk for falls. Baseline: 02/05/23 13/24 Goal status: IN PROGRESS  5.  Patient will report 9 points improvement on LEFS to demonstrate improved functional ability. Baseline: 36/80 Goal status: MET- 55/80% LEFS  6.  Patient will demonstrate gait speed of >0.8 m/s to be a safe community ambulator with decreased risk for recurrent falls.  Baseline: 0.4 with RW, 0.68 m/s without AD Goal status: IN PROGRESS 03/13/23- 0.79m/s typical, 1.0 m/s when asked to walk faster.   7.  Patient will report 75% improvement in L hip pain to improve QOL.  Baseline:  Goal status: IN PROGRESS 03/13/23- 5/10 L hip pain.  PLAN:   PT FREQUENCY: 1-2x/week  PT DURATION: 8  weeks  PLANNED INTERVENTIONS: Therapeutic exercises, Therapeutic activity, Neuromuscular re-education, Balance training, Gait training, Patient/Family education, Self Care, Joint mobilization, Stair training, Orthotic/Fit training, Dry Needling, Electrical stimulation, Spinal mobilization, Cryotherapy, Moist heat, Ultrasound, Ionotophoresis 4mg /ml Dexamethasone, Manual therapy, and Re-evaluation  PLAN FOR NEXT SESSION: review new HEP, DGI, last visit  Jena Gauss, PT, DPT  2:43 PM 03/18/2023

## 2023-03-21 ENCOUNTER — Ambulatory Visit: Payer: Medicare Other

## 2023-03-21 DIAGNOSIS — R2681 Unsteadiness on feet: Secondary | ICD-10-CM

## 2023-03-21 DIAGNOSIS — M6281 Muscle weakness (generalized): Secondary | ICD-10-CM

## 2023-03-21 DIAGNOSIS — G8929 Other chronic pain: Secondary | ICD-10-CM

## 2023-03-21 DIAGNOSIS — R262 Difficulty in walking, not elsewhere classified: Secondary | ICD-10-CM

## 2023-03-21 DIAGNOSIS — M25552 Pain in left hip: Secondary | ICD-10-CM

## 2023-03-21 NOTE — Therapy (Addendum)
OUTPATIENT PHYSICAL THERAPY TREATMENT/DISCHARGE VISIT  Patient Name: Jose [name]. MRN: [id number] DOB:1936/06/19, 87 y.o., male Today's Date: 03/21/2023  END OF SESSION:  PT End of Session - 03/21/23 1457     Visit Number 12    Number of Visits 16    Date for PT Re-Evaluation 03/25/23    Authorization Type Medicare + [employer]    PT Start Time 1403    PT Stop Time 1446    PT Time Calculation (min) 43 min    Activity Tolerance Patient tolerated treatment well    Behavior During Therapy WFL for tasks assessed/performed                  Past Medical History:  Diagnosis Date   Arthritis    CAD (coronary artery disease)    04/15/18 65% dLAD, 20% pLAD, 30% 2nd diag, Normal EF   Dysrhythmia    afib   Gallstones 07/1996   Headache    couple times a year   History of CT scan of head 1997   small vessel disease   History of ETT 05/2002    no EKG changes   History of MRI of cervical spine 07/15/2002   Hypertension    Lumbar spondylolysis 12/17/2016   MGUS (monoclonal gammopathy of unknown significance) 04/11/2012   Occipital neuralgia of left side 12/09/2018   Peripheral neuropathy    Pneumonia 10/2014   PONV (postoperative nausea and vomiting)    once many years ago   Primary localized osteoarthritis of left hip 07/08/2018   Primary localized osteoarthrosis of left shoulder 12/25/2016   Primary localized osteoarthrosis of right shoulder 11/05/2017   Rectal bleeding 12/2002   colonoscopy   Seizures (HCC)    last seizure 1976   Skin cancer    Sleep apnea    uses CPAP   Spinal stenosis, lumbar region with neurogenic claudication    Stroke [facility])    ?tia ?afib   TIA (transient ischemic attack)    Ulnar neuropathy at elbow 12/24/2014   Left   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 extremity 03/29/2015   Past Surgical History:  Procedure Laterality Date   ANTERIOR CERVICAL DECOMP/DISCECTOMY FUSION N/A 02/04/2015   Procedure: ANTERIOR CERVICAL DECOMPRESSION/DISCECTOMY  FUSION CERVICAL FIVE-SIX,CERVICAL SIX-SEVEN;  Surgeon: Aliene Beams, MD;  Location: [facility];  Service: Neurosurgery;  Laterality: N/A;   arthroscopic knee surgery     BACK SURGERY     CATARACT EXTRACTION Bilateral    COLONOSCOPY W/ POLYPECTOMY  04/19/2003   tubular adenoma   EYE SURGERY     Bilateral Cataracts   GREAT TOE ARTHRODESIS, INTERPHALANGEAL JOINT Left 1978   JOINT REPLACEMENT     joint replacement on right thumb Right    LEFT HEART CATH AND CORONARY ANGIOGRAPHY N/A 04/15/2018   Procedure: LEFT HEART CATH AND CORONARY ANGIOGRAPHY;  Surgeon: Lennette Bihari, MD;  Location: [facility];  Service: Cardiovascular;  Laterality: N/A;   LUMBAR LAMINECTOMY/DECOMPRESSION MICRODISCECTOMY N/A 07/19/2017   Procedure: LAMINECTOMY AND FORAMINOTOMY  LUMBAR TWO- LUMBAR THREE, LUMBAR THREE- LUMBAR FOUR;  Surgeon: Coletta Memos, MD;  Location: [facility] OR;  Service: Neurosurgery;  Laterality: N/A;   nerve transfer surgery to lwft hand Left    Nov. 2017   SHOULDER ARTHROSCOPY W/ ROTATOR CUFF REPAIR Left 2013   rotator cuff    SHOULDER INJECTION  03/2011   left, [facility]   SKIN CANCER EXCISION  07/1993   left lateral arm   SUPERFICIAL KERATECTOMY Right  05/1998   wrist   thumb injection  03/2011   left, [facility]   TOE SURGERY Right    TONSILLECTOMY AND ADENOIDECTOMY     TOTAL HIP ARTHROPLASTY Left 07/08/2018   TOTAL HIP ARTHROPLASTY Left 07/08/2018   Procedure: LEFT TOTAL HIP ARTHROPLASTY;  Surgeon: Teryl Lucy, MD;  Location: [facility] OR;  Service: Orthopedics;  Laterality: Left;   TOTAL SHOULDER ARTHROPLASTY Left 12/25/2016   Procedure: TOTAL SHOULDER ARTHROPLASTY;  Surgeon: Teryl Lucy, MD;  Location: [facility] OR;  Service: Orthopedics;  Laterality: Left;   TOTAL SHOULDER ARTHROPLASTY Right 11/05/2017   TOTAL SHOULDER ARTHROPLASTY Right 11/05/2017   Procedure: TOTAL SHOULDER ARTHROPLASTY;  Surgeon: Teryl Lucy, MD;  Location: [facility] OR;  Service: Orthopedics;  Laterality: Right;   ulnar nerve  transfer  12/1999   left   ULNAR NERVE TRANSPOSITION Left    Patient Active Problem List   Diagnosis Date Noted   Memory loss 04/18/2021   Occipital neuralgia of left side 12/09/2018   Primary localized osteoarthritis of left hip 07/08/2018   Primary localized osteoarthritis of hip 07/08/2018   CAD (coronary artery disease) 04/16/2018   Primary localized osteoarthrosis of right shoulder 11/05/2017   Primary localized osteoarthrosis of shoulder 11/05/2017   Lumbar stenosis with neurogenic claudication 07/19/2017   Primary localized osteoarthrosis of left shoulder 12/25/2016   S/P shoulder replacement 12/25/2016   Lumbar spondylolysis 12/17/2016   Paresthesia 01/24/2016   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 extremity 03/29/2015   Cervical spinal stenosis 02/04/2015   Bronchiectasis without acute exacerbation (HCC) 01/11/2015   TIA (transient ischemic attack) 12/15/2014   Hyperlipidemia 11/24/2014   PAF (paroxysmal atrial fibrillation) (HCC)    MGUS (monoclonal gammopathy of unknown significance) 04/11/2012   Gout, unspecified 10/04/2009   BPH (benign prostatic hyperplasia) 12/14/2008   ALLERGIC CONJUNCTIVITIS 12/03/2007   TESTOSTERONE DEFICIENCY 11/14/2007   Thoracic back pain 04/22/2007   OBESITY, NOS 12/26/2006   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   THERAPY DIAG:  Unsteadiness on feet  Muscle weakness (generalized)  Pain in left hip  Chronic pain of right knee  Difficulty in walking, not elsewhere classified  Rationale for Evaluation and Treatment: Rehabilitation  ONSET  DATE: ~ 1 year  SUBJECTIVE:   SUBJECTIVE STATEMENT: No real hip or knee pain today, although he is noticing pain on R side of chest, not sharp in nature.   PERTINENT HISTORY: PMHX significant of a-fib, TIA, Alzheimer's. Recent hospitalization, presented due to left sided weakness.  History of ACDF, back surgery, L great toe arthrodesis, bil TSA, R THA, OA multiple sites.   Neuropathy bil feet.   Per MD notes "Since Discharge he is doing well overall. Wife accompanies him today and aides in hx. He had stopped walking and doing this b.c he thought the ortho.told him if something happened to his rt knee he would down. He became more deconditioned due to this. He is now wearing a knee brace and trying to walk more. Using walking for long distanced. Denies worsening weakness or any current neuro.changes. Wife checking bp , did have fu with cardiology and they want her to reach out with him numbers over  the next week or so to see if any medications needs to be restarted. Vitals look good today in office."  PAIN:  Are you having pain? Yes: NPRS scale: 2-3/10 Pain location: R knee   Are you having pain? Yes: NPRS scale: 2-3/10 Pain location: L hip   PRECAUTIONS: None  WEIGHT BEARING RESTRICTIONS: No  FALLS:  Has patient fallen in last 6 months? No  LIVING ENVIRONMENT: Lives with: lives with their spouse Lives in: House/apartment Stairs: Yes: External: 1 steps; none Has following equipment at home: Environmental consultant - 2 wheeled and Wheelchair (power)  OCCUPATION: retired    PLOF: Independent with household mobility with device  PATIENT GOALS: less pain    OBJECTIVE:   DIAGNOSTIC FINDINGS: 03/28/23 MR Brain IMPRESSION:  Mildly motion degraded exam.   No evidence of acute intracranial abnormality.   Mild chronic small vessel image changes within the cerebral white  matter, slightly progressed from the prior MRI of 02/22/2016.   Small chronic lacunar infarct within the right basal ganglia,  new  from the prior MRI.   Mild-to-moderate for age generalized cerebral atrophy.   PATIENT SURVEYS:  LEFS 36/80 = 45% ability   COGNITION: Overall cognitive status: Within functional limits for tasks assessed and History of cognitive impairments - at baseline     SENSATION: Not tested  EDEMA:  Bil +1 ankle edema  MUSCLE LENGTH: Hamstrings: mild tightness bil   POSTURE: forward head and decreased lumbar lordosis  PALPATION: Tenderness over L hip trochanter, L glut medius and piriforms  LOWER EXTREMITY ROM:  tightness L hip flexion, to neutral.  Good hip mobility for ER and IR bil, symmetric.     LOWER EXTREMITY MMT:  MMT Right eval Left eval  Hip flexion 5 5  Hip extension 4 4  Hip abduction 5 5  Hip adduction 5 5  Knee flexion 5 5  Knee extension 5 5  Ankle dorsiflexion 4 4  Ankle plantarflexion 4 4   (Blank rows = not tested)  LOWER EXTREMITY SPECIAL TESTS:  NT  FUNCTIONAL TESTS:  5 times sit to stand: 13.7 seconds - without UE assist.  Table height 20 inches (to place hips and knees at 90/90) 2 minute walk test: 270' without AD MCTSIB: Condition 1: Avg of 3 trials: 30 sec, Condition 2: Avg of 3 trials: 30 sec, Condition 3: Avg of 3 trials: 30 sec, Condition 4: Avg of 3 trials: 6 sec, and Total Score: 96/120  DGI 13/24 GAIT: Distance walked: 270' Assistive device utilized: None Level of assistance: Complete Independence Comments: gait with forward flexed posture, hip and knee flexion throughout gait cycle, landing flat footed, decreased stride length.  Brace on R knee.  Prefers walking with 2WRW for community ambulation.    Gait speed with RW - 0.4 m/s, without AD 0.68 m/s   TODAY'S TREATMENT:  DATE:  03/21/23 Therapeutic Exercise: to improve strength and mobility.  Demo, verbal and tactile cues throughout for technique. Bike  L1x70min 15/24 on DGI Seated hamstring stretch x 30 sec bil Seated LAQ bil w/ blue TB x 10  Standing hip abduction x 10 red TB 2HHA Standing hip extension x 10 red TB 2HHA Standing toe raises x 10 2HHA Bridge 2x10 SLR 2x10 bil  03/18/23 Therapeutic Exercise: to improve strength and mobility.  Demo, verbal and tactile cues throughout for technique. Nustep L5 x 6 min  Knee extension 30# 2 x 10, 35# 1 x 10 Knee flexion 35# 3 x 10  Supine bridges 3 x 10 Supine SLR 2 x 10 bil  Prone knee extension 2 x 10 bil  S/L clams 2 x 10 bil    03/13/23 Therapeutic Activity:  to assess progress towards goals.  Vitals: HR 70, O2 97 BP 140/90  Gait 600' MMT Therapeutic Exercise: to improve strength and mobility.  Demo, verbal and tactile cues throughout for technique. Knee extension 25# x 15  Knee flexion 25# x 15 Side stepping at counter 4 x 8' bil  Retro walking 1 hand on counter /SBA 4 x 8' bil  Tandem walking 1 hand out counter/ SBA 6 x 8' Review of HEP and importance.    03/11/2023 Therapeutic Exercise: to improve strength and mobility.  Demo, verbal and tactile cues throughout for technique. Nustep L5 x 6 min  Lower Extremity Functional Score: 55 / 80 = 68.8 % Seated march 3# 2x10 B Seated LAQ 3# x 10 B Seated ER 3# x 10 B Supine clams green TB x 10 B Supine SLR R/L x 10   02/28/2023 Therapeutic Exercise: to improve strength and mobility.  Demo, verbal and tactile cues throughout for technique. Nustep L5 x 6 min  Knee flexion 35# 2x10 B cues for eccentric control Knee extension 35# 2x10 B cues for eccentric control Step ups 6" (aerobic step + 1 riser) x 10 bil  Side step ups 6" x 10 bil     PATIENT EDUCATION:  Education details: HEP update Person educated: Patient Education method: Explanation and Handouts Education comprehension: verbalized understanding and returned demonstration  HOME EXERCISE PROGRAM: Access Code: [email or web address] URL: [email or web address] Date:  03/21/2023 Prepared by: Verta Ellen  Exercises - Toe Raises with Counter Support  - 1 x daily - 7 x weekly - 2 sets - 10 reps - Standing Hip Extension with Resistance at Ankles and Counter Support  - 1 x daily - 7 x weekly - 3 sets - 10 reps - Standing Hip Abduction with Resistance at Ankles and Counter Support  - 1 x daily - 7 x weekly - 3 sets - 10 reps - Seated Hamstring Stretch  - 1 x daily - 7 x weekly - 1 sets - 3 reps - 30 sec  hold - Seated Knee Extension with Resistance  - 1 x daily - 7 x weekly - 3 sets - 10 reps - Seated Quad Set  - 1 x daily - 7 x weekly - 1 sets - 10 reps - 5 sec  hold - Seated March  - 1 x daily - 7 x weekly - 2 sets - 10 reps - Mini Squat with Counter Support  - 1 x daily - 7 x weekly - 2-3 sets - 10 reps - 53 sec  hold - Supine Bridge  - 1 x daily - 7 x weekly - 2-3 sets - 10 reps - Supine Active  Straight Leg Raise  - 1 x daily - 7 x weekly - 2-3 sets - 10 reps - Clamshell  - 1 x daily - 7 x weekly - 2-3 sets - 10 reps - Prone Hip Extension  - 1 x daily - 7 x weekly - 2-3 sets - 10 reps  Patient Education - Postural Hypotension - Check for Safety  ASSESSMENT:  CLINICAL IMPRESSION: Pt arrived with no new complaints with R knee or hip, he reported some R side chest pain starting today, not sharp in nature. DGI was assessed today, score improved by 2 points (15/24), progress toward LTG #4 but not met. Reviewed HEP to ensure understanding and adding resistance to LAQ, as well as standing hip strengthening. Pt is pleased with improvement from PT and wanting to focus more on summer travel, he wishes to D/C from PT.   OBJECTIVE IMPAIRMENTS: Abnormal gait, decreased activity tolerance, decreased balance, decreased endurance, decreased mobility, difficulty walking, decreased ROM, decreased strength, increased muscle spasms, impaired flexibility, postural dysfunction, and pain.   ACTIVITY LIMITATIONS: carrying, bending, standing, squatting, stairs, and locomotion  level  PARTICIPATION LIMITATIONS: cleaning, community activity, and yard work  PERSONAL FACTORS: Age and 3+ comorbidities: A-fib, TIA, Alzheimer's, history of ACDF, back surgery, L great toe arthrodesis, bil TSA, OA multiple sites.   are also affecting patient's functional outcome.   REHAB POTENTIAL: Good  CLINICAL DECISION MAKING: Evolving/moderate complexity  EVALUATION COMPLEXITY: Moderate   GOALS: Goals reviewed with patient? Yes  SHORT TERM GOALS: Target date: 02/11/2023   Patient will be independent with initial HEP. Baseline:  Goal status: MET 02/05/23 given initial HEP 02/28/23- reports daily compliance  2.  Patient will be educated on strategies to decrease risk of falls.  Baseline: educated and handout handout provided Goal status: MET   LONG TERM GOALS: Target date: 03/25/2023   Patient will be independent with advanced/ongoing HEP to improve outcomes and carryover.  Baseline:  Goal status: NOT MET 03/13/23- doing "some" exercises, but hasn't done any this week   2.  Patient will be able to ambulate 600' without increased R hip pain and with good safety to access community.  Baseline: 270' Goal status: NOT MET 03/13/23- 600', initially said that "hips were good", but when directly asked reported 5/10 L hip pain.   3.   Patient will demonstrate improved functional LE strength as demonstrated by 4+/5 hip and ankle strength. Baseline: see objective Goal status: MET 03/13/23-  4+/5 hip extension, ankle DF/PF strength, tested in sitting due to foot injury.   4.  Patient will demonstrate at least 19/24 on DGI to improve gait stability and reduce risk for falls. Baseline: 02/05/23 13/24 Goal status: NOT MET- 03/21/23 15/24  5.  Patient will report 9 points improvement on LEFS to demonstrate improved functional ability. Baseline: 36/80 Goal status: MET- 55/80% LEFS  6.  Patient will demonstrate gait speed of >0.8 m/s to be a safe community ambulator with decreased risk for  recurrent falls.  Baseline: 0.4 with RW, 0.68 m/s without AD Goal status: NOT MET 03/13/23- 0.15m/s typical, 1.0 m/s when asked to walk faster.   7.  Patient will report 75% improvement in L hip pain to improve QOL.  Baseline:  Goal status: NOT MET 03/13/23- 5/10 L hip pain.  PLAN:   PT FREQUENCY: 1-2x/week  PT DURATION: 8 weeks  PLANNED INTERVENTIONS: Therapeutic exercises, Therapeutic activity, Neuromuscular re-education, Balance training, Gait training, Patient/Family education, Self Care, Joint mobilization, Stair training, Orthotic/Fit training, Dry Needling, Electrical  stimulation, Spinal mobilization, Cryotherapy, Moist heat, Ultrasound, Ionotophoresis 4mg /ml Dexamethasone, Manual therapy, and Re-evaluation  PLAN FOR NEXT SESSION: discharge  Darleene Cleaver, PTA 2:58 PM 03/21/2023  PHYSICAL THERAPY DISCHARGE SUMMARY  Visits from Start of Care: 12  Current functional level related to goals / functional outcomes: Improved strength, balance and mobility.  Met 3/7 LTG, making progress towards remaining   Remaining deficits: L hip pain, DGI 15/24 places at higher risk for falls.    Education / Equipment: HEP  Plan: Patient agrees to discharge. Patient is being discharged due to meeting the stated rehab goals and patient request.      Jena Gauss, PT, DPT 03/22/2023 11:49 AM

## 2023-03-27 ENCOUNTER — Other Ambulatory Visit (HOSPITAL_BASED_OUTPATIENT_CLINIC_OR_DEPARTMENT_OTHER): Payer: Self-pay

## 2023-03-27 MED ORDER — BROMFENAC SODIUM 0.07 % OP SOLN
1.0000 [drp] | Freq: Three times a day (TID) | OPHTHALMIC | 1 refills | Status: AC
  Filled 2023-03-27: qty 3, 30d supply, fill #0

## 2023-03-28 ENCOUNTER — Emergency Department (HOSPITAL_COMMUNITY)
Admission: EM | Admit: 2023-03-28 | Discharge: 2023-03-28 | Disposition: A | Discharge status: Home / Self Care | Payer: Medicare Other | Attending: Emergency Medicine | Admitting: Emergency Medicine

## 2023-03-28 ENCOUNTER — Emergency Department (HOSPITAL_COMMUNITY): Payer: Medicare Other

## 2023-03-28 ENCOUNTER — Encounter (HOSPITAL_COMMUNITY): Payer: Self-pay

## 2023-03-28 DIAGNOSIS — I2510 Atherosclerotic heart disease of native coronary artery without angina pectoris: Secondary | ICD-10-CM | POA: Diagnosis not present

## 2023-03-28 DIAGNOSIS — R208 Other disturbances of skin sensation: Secondary | ICD-10-CM | POA: Insufficient documentation

## 2023-03-28 DIAGNOSIS — Z79899 Other long term (current) drug therapy: Secondary | ICD-10-CM | POA: Diagnosis not present

## 2023-03-28 DIAGNOSIS — Z85828 Personal history of other malignant neoplasm of skin: Secondary | ICD-10-CM | POA: Diagnosis not present

## 2023-03-28 DIAGNOSIS — Z7982 Long term (current) use of aspirin: Secondary | ICD-10-CM | POA: Diagnosis not present

## 2023-03-28 DIAGNOSIS — F0390 Unspecified dementia without behavioral disturbance: Secondary | ICD-10-CM | POA: Insufficient documentation

## 2023-03-28 DIAGNOSIS — I10 Essential (primary) hypertension: Secondary | ICD-10-CM | POA: Insufficient documentation

## 2023-03-28 DIAGNOSIS — Z87891 Personal history of nicotine dependence: Secondary | ICD-10-CM | POA: Insufficient documentation

## 2023-03-28 DIAGNOSIS — Z7901 Long term (current) use of anticoagulants: Secondary | ICD-10-CM | POA: Insufficient documentation

## 2023-03-28 DIAGNOSIS — Z8673 Personal history of transient ischemic attack (TIA), and cerebral infarction without residual deficits: Secondary | ICD-10-CM | POA: Insufficient documentation

## 2023-03-28 LAB — COMPREHENSIVE METABOLIC PANEL
ALT: 37 U/L (ref 0–44)
AST: 41 U/L (ref 15–41)
Albumin: 4.3 g/dL (ref 3.5–5.0)
Alkaline Phosphatase: 55 U/L (ref 38–126)
Anion gap: 13 (ref 5–15)
BUN: 14 mg/dL (ref 8–23)
CO2: 25 mmol/L (ref 22–32)
Calcium: 9.1 mg/dL (ref 8.9–10.3)
Chloride: 101 mmol/L (ref 98–111)
Creatinine, Ser: 0.8 mg/dL (ref 0.61–1.24)
GFR, Estimated: >60 mL/min (ref >60)
Glucose, Bld: 114 mg/dL — ABNORMAL HIGH (ref 70–99)
Potassium: 3.8 mmol/L (ref 3.5–5.1)
Sodium: 139 mmol/L (ref 135–145)
Total Bilirubin: 1 mg/dL (ref 0.3–1.2)
Total Protein: 7 g/dL (ref 6.5–8.1)

## 2023-03-28 LAB — CBC WITH DIFFERENTIAL/PLATELET
Abs Immature Granulocytes: 0.03 10*3/uL (ref 0.00–0.07)
Basophils Absolute: 0.1 10*3/uL (ref 0.0–0.1)
Basophils Relative: 1 %
Eosinophils Absolute: 0.1 10*3/uL (ref 0.0–0.5)
Eosinophils Relative: 2 %
HCT: 44.4 % (ref 39.0–52.0)
Hemoglobin: 14.4 g/dL (ref 13.0–17.0)
Immature Granulocytes: 1 %
Lymphocytes Relative: 25 %
Lymphs Abs: 1.6 10*3/uL (ref 0.7–4.0)
MCH: 31.9 pg (ref 26.0–34.0)
MCHC: 32.4 g/dL (ref 30.0–36.0)
MCV: 98.4 fL (ref 80.0–100.0)
Monocytes Absolute: 0.4 10*3/uL (ref 0.1–1.0)
Monocytes Relative: 7 %
Neutro Abs: 4 10*3/uL (ref 1.7–7.7)
Neutrophils Relative %: 64 %
Platelets: 175 10*3/uL (ref 150–400)
RBC: 4.51 MIL/uL (ref 4.22–5.81)
RDW: 12.9 % (ref 11.5–15.5)
WBC: 6.2 10*3/uL (ref 4.0–10.5)
nRBC: 0 % (ref 0.0–0.2)

## 2023-03-28 MED ORDER — ACETAMINOPHEN 500 MG PO TABS
1000.0000 mg | ORAL_TABLET | Freq: Once | ORAL | Status: AC
  Administered 2023-03-28: 1000 mg via ORAL
  Filled 2023-03-28: qty 2

## 2023-03-28 NOTE — ED Triage Notes (Signed)
Pt BIB [facility] from home for dizziness, seen for same two months ago. Woke up this morning feeling dizzy. Denies headache but c/o of back of his neck feels warm. Hx afib and dementia, A/O x3 at baseline, has not taken any of his home meds today.   BP  164/80 100% room air HR 56 CBG 128

## 2023-03-28 NOTE — Discharge Instructions (Addendum)
We evaluated you for your warm sensation in your head.  Your evaluation did not show any dangerous process.  Your head CT was reassuring and your laboratory testing was also reassuring.  We did not see any signs of a skin infection.  Please follow-up closely with your primary doctor.  Please return to the emergency department if you have any new or worsening symptoms, such as numbness or tingling, weakness, fevers or chills, severe headaches, vision changes, trouble swallowing, trouble speaking, or any other concerning symptoms.

## 2023-03-28 NOTE — ED Provider Notes (Signed)
[place] EMERGENCY DEPARTMENT AT [facility] Provider Note  CSN: [id number] Arrival date & time: 03/28/23 4098  Chief Complaint(s) Dizziness  HPI Jose [name]. is a 87 y.o. male history of coronary artery disease,, prior stroke, paroxysmal A-fib on Eliquis, hypertension, mild dementia presenting to the emergency department with "warm sensation".  Patient reports that he got out of bed this morning and felt that the left posterior aspect of his scalp felt "warm".  He denies headaches, nausea or vomiting, chest pain, shortness of breath, abdominal pain, numbness or tingling, vision changes, loss of consciousness, dizziness or lightheadedness, or any other acute symptoms.  No fevers or chills.  No neck stiffness.  His triage note reports that he is here for dizziness although patient denies any sensation of dizziness to me.   Past Medical History Past Medical History:  Diagnosis Date   Arthritis    CAD (coronary artery disease)    04/15/18 65% dLAD, 20% pLAD, 30% 2nd diag, Normal EF   Dysrhythmia    afib   Gallstones 07/1996   Headache    couple times a year   History of CT scan of head 1997   small vessel disease   History of ETT 05/2002    no EKG changes   History of MRI of cervical spine 07/15/2002   Hypertension    Lumbar spondylolysis 12/17/2016   MGUS (monoclonal gammopathy of unknown significance) 04/11/2012   Occipital neuralgia of left side 12/09/2018   Peripheral neuropathy    Pneumonia 10/2014   PONV (postoperative nausea and vomiting)    once many years ago   Primary localized osteoarthritis of left hip 07/08/2018   Primary localized osteoarthrosis of left shoulder 12/25/2016   Primary localized osteoarthrosis of right shoulder 11/05/2017   Rectal bleeding 12/2002   colonoscopy   Seizures (HCC)    last seizure 1976   Skin cancer    Sleep apnea    uses CPAP   Spinal stenosis, lumbar region with neurogenic claudication    Stroke [facility])    ?tia ?afib   TIA  (transient ischemic attack)    Ulnar neuropathy at elbow 12/24/2014   Left   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 extremity 03/29/2015   Patient Active Problem List   Diagnosis Date Noted   Memory loss 04/18/2021   Occipital neuralgia of left side 12/09/2018   Primary localized osteoarthritis of left hip 07/08/2018   Primary localized osteoarthritis of hip 07/08/2018   CAD (coronary artery disease) 04/16/2018   Primary localized osteoarthrosis of right shoulder 11/05/2017   Primary localized osteoarthrosis of shoulder 11/05/2017   Lumbar stenosis with neurogenic claudication 07/19/2017   Primary localized osteoarthrosis of left shoulder 12/25/2016   S/P shoulder replacement 12/25/2016   Lumbar spondylolysis 12/17/2016   Paresthesia 01/24/2016   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 extremity 03/29/2015   Cervical spinal stenosis 02/04/2015   Bronchiectasis without acute exacerbation (HCC) 01/11/2015   TIA (transient ischemic attack) 12/15/2014   Hyperlipidemia 11/24/2014   PAF (paroxysmal atrial fibrillation) (HCC)    MGUS (monoclonal gammopathy of unknown significance) 04/11/2012   Gout, unspecified 10/04/2009   BPH (benign prostatic hyperplasia) 12/14/2008   ALLERGIC CONJUNCTIVITIS 12/03/2007   TESTOSTERONE DEFICIENCY 11/14/2007   Thoracic back pain 04/22/2007   OBESITY, NOS 12/26/2006   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 Authorizing Provider  acetaminophen (TYLENOL) 500 MG tablet Take 500 mg by mouth 3 (three) times daily.    [provider]  Aflibercept (EYLEA IZ) 1 Dose by Intravitreal route See admin instructions. 1 injection into each eye  every 8-10 weeks.    [provider]  amLODipine (NORVASC) 5 MG tablet Take 5 mg by mouth daily. 06/08/19   [provider]  apixaban (ELIQUIS) 5 MG TABS tablet TAKE 1 TABLET TWICE A DAY 12/20/21   Kathleene Hazel, MD  ASCORBIC ACID PO Take 1 tablet by mouth daily. Vitamin C, unknown strength.    [provider]  aspirin EC 81 MG tablet Take 81 mg by mouth daily.    [provider]  atorvastatin (LIPITOR) 80 MG tablet Take 40 mg by mouth daily with supper.    [provider]  azelastine (ASTELIN) 0.1 % nasal spray Place 1 spray into both nostrils daily. Use in each nostril as directed    [provider]  Bromfenac Sodium (PROLENSA) 0.07 % SOLN Administer 1 drop into left eye three times daily. 12/14/22     Bromfenac Sodium (PROLENSA) 0.07 % SOLN Place 1 drop into the left eye 3 (three) times daily for 3 days then stop 03/27/23     Clobetasol Propionate 0.05 % shampoo Apply 1 application  topically See admin instructions. Apply topically to scalp 2-3 times a week as needed for severe dry skin.    [provider]  COCONUT OIL PO Take 1 capsule by mouth daily with lunch.    [provider]  donepezil (ARICEPT) 10 MG tablet Take 1 tablet (10 mg total) by mouth at bedtime. 06/21/22     fluticasone (FLONASE) 50 MCG/ACT nasal spray Place 2 sprays into both nostrils daily. 08/29/22     isosorbide mononitrate (IMDUR) 30 MG 24 hr tablet TAKE 1 TABLET TWICE A DAY (DOSE INCREASE) 11/03/21   Kathleene Hazel, MD  labetalol (NORMODYNE) 100 MG tablet Take 0.5 tablets (50 mg total) by mouth 2 (two) times daily. 01/11/23   Kathleene Hazel, MD  memantine (NAMENDA) 5 MG tablet Take 1 tablet by mouth daily X 3 weeks, then take 1 tablet twice a day thereafter 07/23/22     Multiple Vitamins-Minerals (PRESERVISION AREDS 2) CAPS Take 1 capsule by mouth 2 (two) times daily.     [provider]  nitroGLYCERIN (NITROSTAT) 0.4 MG SL  tablet PLACE 1 TABLET (0.4 MG TOTAL) UNDER THE TONGUE EVERY 5 (FIVE) MINUTES X 3 DOSES AS NEEDED FOR CHEST PAIN. 03/03/21   Kathleene Hazel, MD  Polyethyl Glycol-Propyl Glycol (SYSTANE OP) Place 1 drop into both eyes 4 (four) times daily as needed (dry eyes, irritated eyes.).    [provider]  ROCKLATAN 0.02-0.005 % SOLN Place 1 drop into the right eye at bedtime. 04/10/19   [provider]  tolterodine (DETROL LA) 2 MG 24 hr capsule Take 2 mg by mouth daily.    [provider]  Past Surgical History Past Surgical History:  Procedure Laterality Date   ANTERIOR CERVICAL DECOMP/DISCECTOMY FUSION N/A 02/04/2015   Procedure: ANTERIOR CERVICAL DECOMPRESSION/DISCECTOMY FUSION CERVICAL FIVE-SIX,CERVICAL SIX-SEVEN;  Surgeon: Aliene Beams, MD;  Location: [facility] NEURO ORS;  Service: Neurosurgery;  Laterality: N/A;   arthroscopic knee surgery     BACK SURGERY     CATARACT EXTRACTION Bilateral    COLONOSCOPY W/ POLYPECTOMY  04/19/2003   tubular adenoma   EYE SURGERY     Bilateral Cataracts   GREAT TOE ARTHRODESIS, INTERPHALANGEAL JOINT Left 1978   JOINT REPLACEMENT     joint replacement on right thumb Right    LEFT HEART CATH AND CORONARY ANGIOGRAPHY N/A 04/15/2018   Procedure: LEFT HEART CATH AND CORONARY ANGIOGRAPHY;  Surgeon: Lennette Bihari, MD;  Location: [facility] INVASIVE CV LAB;  Service: Cardiovascular;  Laterality: N/A;   LUMBAR LAMINECTOMY/DECOMPRESSION MICRODISCECTOMY N/A 07/19/2017   Procedure: LAMINECTOMY AND FORAMINOTOMY  LUMBAR TWO- LUMBAR THREE, LUMBAR THREE- LUMBAR FOUR;  Surgeon: Coletta Memos, MD;  Location: [facility] OR;  Service: Neurosurgery;  Laterality: N/A;   nerve transfer surgery to lwft hand Left    Nov. 2017   SHOULDER ARTHROSCOPY W/ ROTATOR CUFF REPAIR Left 2013   rotator cuff    SHOULDER INJECTION  03/2011   left, [facility]    SKIN CANCER EXCISION  07/1993   left lateral arm   SUPERFICIAL KERATECTOMY Right 05/1998   wrist   thumb injection  03/2011   left, [place]   TOE SURGERY Right    TONSILLECTOMY AND ADENOIDECTOMY     TOTAL HIP ARTHROPLASTY Left 07/08/2018   TOTAL HIP ARTHROPLASTY Left 07/08/2018   Procedure: LEFT TOTAL HIP ARTHROPLASTY;  Surgeon: Teryl Lucy, MD;  Location: [facility] OR;  Service: Orthopedics;  Laterality: Left;   TOTAL SHOULDER ARTHROPLASTY Left 12/25/2016   Procedure: TOTAL SHOULDER ARTHROPLASTY;  Surgeon: Teryl Lucy, MD;  Location: [facility] OR;  Service: Orthopedics;  Laterality: Left;   TOTAL SHOULDER ARTHROPLASTY Right 11/05/2017   TOTAL SHOULDER ARTHROPLASTY Right 11/05/2017   Procedure: TOTAL SHOULDER ARTHROPLASTY;  Surgeon: Teryl Lucy, MD;  Location: [facility] OR;  Service: Orthopedics;  Laterality: Right;   ulnar nerve transfer  12/1999   left   ULNAR NERVE TRANSPOSITION Left    Family History Family History  Problem Relation Age of Onset   Dementia Mother 45       vascular   Transient ischemic attack Mother    Dementia Father        after hip fracture   Hypertension Sister        skin cancer   Seizures Neg Hx    Sleep apnea Neg Hx    Neuropathy Neg Hx     Social History Social History   Tobacco Use   Smoking status: Former    Packs/day: 2.00    Years: 13.00    Additional pack years: 0.00    Total pack years: 26.00    Types: Cigarettes    Quit date: 03/05/1968    Years since quitting: 55.0   Smokeless tobacco: Never  Vaping Use   Vaping Use: Never used  Substance Use Topics   Alcohol use: Yes    Alcohol/week: 2.0 standard drinks of alcohol    Types: 2 Glasses of wine per week    Comment: social   Drug use: No   Allergies Maprotiline and Sumycin [tetracycline]  Review of Systems Review of Systems  All other systems reviewed and are negative.   Physical Exam Vital Signs  I have  reviewed the triage vital signs BP (!) 162/84   Pulse (!) 54   Temp 97.7 F  (36.5 C) (Oral)   Resp 13   SpO2 96%  Physical Exam Vitals and nursing note reviewed.  Constitutional:      [name]: He is not in acute distress.    Appearance: Normal appearance.  HENT:     Head: Normocephalic and atraumatic.     Comments: Back of scalp appears normal, no warmth erythema    Mouth/Throat:     Mouth: Mucous membranes are moist.  Eyes:     Conjunctiva/sclera: Conjunctivae normal.  Cardiovascular:     Rate and Rhythm: Normal rate and regular rhythm.  Pulmonary:     Effort: Pulmonary effort is normal. No respiratory distress.     Breath sounds: Normal breath sounds.  Abdominal:     [name]: Abdomen is flat.     Palpations: Abdomen is soft.     Tenderness: There is no abdominal tenderness.  Musculoskeletal:     Cervical back: Normal range of motion and neck supple.     Right lower leg: No edema.     Left lower leg: No edema.  Skin:    [name]: Skin is warm and dry.     Capillary Refill: Capillary refill takes less than 2 seconds.  Neurological:     Mental Status: He is alert and oriented to person, place, and time. Mental status is at baseline.     Comments: Cranial nerves II through XII intact including sensation to back of scalp, strength 5 out of 5 in the bilateral upper and lower extremities, no sensory deficit to light touch, no dysmetria on finger-nose-finger testing  Psychiatric:        Mood and Affect: Mood normal.        Behavior: Behavior normal.     ED Results and Treatments Labs (all labs ordered are listed, but only abnormal results are displayed) Labs Reviewed  COMPREHENSIVE METABOLIC PANEL - Abnormal; Notable for the following components:      Result Value   Glucose, Bld 114 (*)    All other components within normal limits  CBC WITH DIFFERENTIAL/PLATELET                                                                                                                          Radiology CT Head Wo Contrast  Result Date: 03/28/2023 CLINICAL  DATA:  Headache, new onset. EXAM: CT HEAD WITHOUT CONTRAST TECHNIQUE: Contiguous axial images were obtained from the base of the skull through the vertex without intravenous contrast. RADIATION DOSE REDUCTION: This exam was performed according to the departmental dose-optimization program which includes automated exposure control, adjustment of the mA and/or kV according to patient size and/or use of iterative reconstruction technique. COMPARISON:  CT head without contrast and MR head without contrast 01/01/2023 FINDINGS: Brain: No acute infarct, hemorrhage, or mass lesion is present. Mild generalized atrophy and white matter disease is similar the prior studies.  The ventricles are proportionate to the degree of atrophy. No significant extraaxial fluid collection is present. Deep brain nuclei are within normal limits. The brainstem and cerebellum are within normal limits. Midline structures are within normal limits. Vascular: Atherosclerotic calcifications are present within the cavernous internal carotid arteries bilaterally. Skull: Calvarium is intact. No focal lytic or blastic lesions are present. No significant extracranial soft tissue lesion is present. Sinuses/Orbits: The paranasal sinuses and mastoid air cells are clear. Bilateral lens replacements are noted. Globes and orbits are otherwise unremarkable. IMPRESSION: 1. No acute intracranial abnormality or significant interval change. 2. Stable atrophy and white matter disease. This likely reflects the sequela of chronic microvascular ischemia. Electronically Signed   By: Marin Roberts M.D.   On: 03/28/2023 12:32    Pertinent labs & imaging results that were available during my care of the patient were reviewed by me and considered in my medical decision making (see MDM for details).  Medications Ordered in ED Medications  acetaminophen (TYLENOL) tablet 1,000 mg (1,000 mg Oral Given 03/28/23 1007)                                                                                                                                      Procedures Procedures  (including critical care time)  Medical Decision Making / ED Course   MDM:  87 year old male presenting to the emergency department with "sensation of warmth".  Patient is well-appearing, vital signs reassuring.  His neurologic exam is normal.  I am not sure what is causing the patient's sensation of warmth.  He reports that he called the paramedics because he was worried it could be a stroke.  He has no clinical findings on his neurologic exam to suggest acute stroke and normal sensation in this area.  There is no evidence of warmth or erythema on physical exam to suggest cellulitis or infection.  He has no neck stiffness or meningismus to suggest meningitis.  He denies headache but will obtain CT scan to evaluate for acute intra cranial process.  Triage reports that he is here for dizziness but he denies this for me and has no dysmetria or cerebellar symptoms.  He did have a workup for dizziness recently with no evidence of acute stroke or aneurysm, vascular stenosis.  Will give Tylenol and reassess.  Clinical Course as of 03/28/23 1312  Thu Mar 28, 2023  1311 Workup overall unremarkable including normal head CT.  He reports this sensation has improved.  Unclear what caused his sensation.  Extremely low concern for stroke, intracranial process, skin infection.  Advise close follow-up with primary physician. [WS]  1312 Will discharge patient to home. All questions answered. Patient comfortable with plan of discharge. Return precautions discussed with patient and specified on the after visit summary.  [WS]    Clinical Course User Index [WS] Lonell Grandchild, MD     Additional history obtained: -Additional history obtained  from ems -External records from outside source obtained and reviewed including: Chart review including previous notes, labs, imaging, consultation notes  including previous ER visit for dizziness   Lab Tests: -I ordered, reviewed, and interpreted labs.   The pertinent results include:   Labs Reviewed  COMPREHENSIVE METABOLIC PANEL - Abnormal; Notable for the following components:      Result Value   Glucose, Bld 114 (*)    All other components within normal limits  CBC WITH DIFFERENTIAL/PLATELET    Notable for very mild hyperglycemia   Imaging Studies ordered: I ordered imaging studies including CT head On my interpretation imaging demonstrates no acute process I independently visualized and interpreted imaging. I agree with the radiologist interpretation   Medicines ordered and prescription drug management: Meds ordered this encounter  Medications   acetaminophen (TYLENOL) tablet 1,000 mg    -I have reviewed the patients home medicines and have made adjustments as needed   Cardiac Monitoring: The patient was maintained on a cardiac monitor.  I personally viewed and interpreted the cardiac monitored which showed an underlying rhythm of: sinus bradycardia  Social Determinants of Health:  Diagnosis or treatment significantly limited by social determinants of health: obesity   Reevaluation: After the interventions noted above, I reevaluated the patient and found that their symptoms have improved  Co morbidities that complicate the patient evaluation  Past Medical History:  Diagnosis Date   Arthritis    CAD (coronary artery disease)    04/15/18 65% dLAD, 20% pLAD, 30% 2nd diag, Normal EF   Dysrhythmia    afib   Gallstones 07/1996   Headache    couple times a year   History of CT scan of head 1997   small vessel disease   History of ETT 05/2002    no EKG changes   History of MRI of cervical spine 07/15/2002   Hypertension    Lumbar spondylolysis 12/17/2016   MGUS (monoclonal gammopathy of unknown significance) 04/11/2012   Occipital neuralgia of left side 12/09/2018   Peripheral neuropathy    Pneumonia 10/2014    PONV (postoperative nausea and vomiting)    once many years ago   Primary localized osteoarthritis of left hip 07/08/2018   Primary localized osteoarthrosis of left shoulder 12/25/2016   Primary localized osteoarthrosis of right shoulder 11/05/2017   Rectal bleeding 12/2002   colonoscopy   Seizures (HCC)    last seizure 1976   Skin cancer    Sleep apnea    uses CPAP   Spinal stenosis, lumbar region with neurogenic claudication    Stroke [facility])    ?tia ?afib   TIA (transient ischemic attack)    Ulnar neuropathy at elbow 12/24/2014   Left   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 extremity 03/29/2015      Dispostion: Disposition decision including need for hospitalization was considered, and patient discharged from emergency department.    Final Clinical Impression(s) / ED Diagnoses Final diagnoses:  Feeling of warmness     This chart was dictated using voice recognition software.  Despite best efforts to proofread,  errors can occur which can change the documentation meaning.    Lonell Grandchild, MD 03/28/23 [phone]

## 2023-04-24 ENCOUNTER — Telehealth: Payer: Self-pay | Admitting: *Deleted

## 2023-04-24 NOTE — Telephone Encounter (Signed)
   Pre-operative Risk Assessment    Patient Name: Jose [name].  DOB: 02-24-1936 MRN: [id number]      Request for Surgical Clearance    Procedure:   RIGHT TOTAL KNEE REPLACEMENT   Date of Surgery:  Clearance TBD                                 Surgeon:  DR. Teryl Lucy Surgeon's Group or Practice Name:  [facility] Phone number:  [phone] ATTN: Alfonso Ramus Fax number:  [phone]   Type of Clearance Requested:   - Medical  - Pharmacy:  Hold Aspirin and Apixaban (Eliquis)     Type of Anesthesia:  Not Indicated (GENERAL?)   Additional requests/questions:    Elpidio Anis   04/24/2023, 2:16 PM

## 2023-04-24 NOTE — Telephone Encounter (Signed)
Left message to call back to schedule an in office appt for pre op clearance. Pt can keep his appt with Dr. Clifton James 07/08/23 as his planned follow up appt. He can see APP for cardiac clearance.

## 2023-04-24 NOTE — Telephone Encounter (Signed)
Pharmacy please advise on holding Eliquis prior to right total knee replacement scheduled for TBD. Thank you.

## 2023-04-24 NOTE — Telephone Encounter (Signed)
Patient with diagnosis of afib on Eliquis for anticoagulation.    Procedure: right TKA Date of procedure: TBD  CHA2DS2-VASc Score = 6  This indicates a 9.7% annual risk of stroke. The patient's score is based upon: CHF History: 0 HTN History: 1 Diabetes History: 0 Stroke History: 2 Vascular Disease History: 1 Age Score: 2 Gender Score: 0  CrCl 38mL/min using adjusted body weight Platelet count 175K  Total knee replacement will require 3 day Eliquis hold, spinal anesthesia is being used. With prior CVA, will forward to MD to confirm this is acceptable.  **This guidance is not considered finalized until pre-operative APP has relayed final recommendations.**

## 2023-04-24 NOTE — Telephone Encounter (Signed)
   Name: Jose [name].  DOB: 23-Mar-1936  MRN: [id number]  Primary Cardiologist: Verne Carrow, MD  Chart reviewed as part of pre-operative protocol coverage. Because of Jose ALPHIN Jr.'s past medical history and time since last visit, he will require a follow-up in-office visit in order to better assess preoperative cardiovascular risk.  Patient was seen in the ED 03/28/2023 with complaint of warm feeling that he felt in the posterior aspect of his scalp.    Pre-op covering staff: - Please schedule appointment and call patient to inform them. If patient already had an upcoming appointment within acceptable timeframe, please add "pre-op clearance" to the appointment notes so provider is aware. - Please contact requesting surgeon's office via preferred method (i.e, phone, fax) to inform them of need for appointment prior to surgery.  Total knee replacement will require 3 day Eliquis hold, spinal anesthesia is being used. With prior CVA   Jose [name], Jose Rains, NP  04/24/2023, 4:56 PM

## 2023-04-24 NOTE — Telephone Encounter (Signed)
CORRECTION ON CLEARANCE INFO ENTERED:   ANESTHESIA IS GOING TO BE SPINAL

## 2023-04-25 ENCOUNTER — Other Ambulatory Visit (HOSPITAL_BASED_OUTPATIENT_CLINIC_OR_DEPARTMENT_OTHER): Payer: Self-pay

## 2023-04-25 MED ORDER — ERYTHROMYCIN 5 MG/GM OP OINT
1.0000 | TOPICAL_OINTMENT | Freq: Three times a day (TID) | OPHTHALMIC | 1 refills | Status: AC
  Filled 2023-04-25: qty 3.5, 5d supply, fill #0

## 2023-04-25 NOTE — Telephone Encounter (Signed)
Pt spouse called to sch with Alden Server, NP 05/07/23.

## 2023-04-25 NOTE — Telephone Encounter (Signed)
I will update all parties involved pt has appt 05/07/23 with Robin Searing, NP.

## 2023-05-06 NOTE — Progress Notes (Unsigned)
Office Visit    Patient Name: Jose [name]. Date of Encounter: 05/06/2023  Primary Care Provider:  Clearnce Hasten, PA-C Primary Cardiologist:  Verne Carrow, MD Primary Electrophysiologist: None   Past Medical History    Past Medical History:  Diagnosis Date   Arthritis    CAD (coronary artery disease)    04/15/18 65% dLAD, 20% pLAD, 30% 2nd diag, Normal EF   Dysrhythmia    afib   Gallstones 07/1996   Headache    couple times a year   History of CT scan of head 1997   small vessel disease   History of ETT 05/2002    no EKG changes   History of MRI of cervical spine 07/15/2002   Hypertension    Lumbar spondylolysis 12/17/2016   MGUS (monoclonal gammopathy of unknown significance) 04/11/2012   Occipital neuralgia of left side 12/09/2018   Peripheral neuropathy    Pneumonia 10/2014   PONV (postoperative nausea and vomiting)    once many years ago   Primary localized osteoarthritis of left hip 07/08/2018   Primary localized osteoarthrosis of left shoulder 12/25/2016   Primary localized osteoarthrosis of right shoulder 11/05/2017   Rectal bleeding 12/2002   colonoscopy   Seizures (HCC)    last seizure 1976   Skin cancer    Sleep apnea    uses CPAP   Spinal stenosis, lumbar region with neurogenic claudication    Stroke [facility])    ?tia ?afib   TIA (transient ischemic attack)    Ulnar neuropathy at elbow 12/24/2014   Left   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 extremity 03/29/2015   Past Surgical History:  Procedure Laterality Date   ANTERIOR CERVICAL DECOMP/DISCECTOMY FUSION N/A 02/04/2015   Procedure: ANTERIOR CERVICAL DECOMPRESSION/DISCECTOMY FUSION CERVICAL FIVE-SIX,CERVICAL SIX-SEVEN;  Surgeon: Aliene Beams, MD;  Location: [facility] NEURO ORS;  Service: Neurosurgery;  Laterality: N/A;   arthroscopic knee surgery     BACK SURGERY     CATARACT EXTRACTION Bilateral    COLONOSCOPY W/ POLYPECTOMY  04/19/2003   tubular adenoma   EYE SURGERY     Bilateral Cataracts    GREAT TOE ARTHRODESIS, INTERPHALANGEAL JOINT Left 1978   JOINT REPLACEMENT     joint replacement on right thumb Right    LEFT HEART CATH AND CORONARY ANGIOGRAPHY N/A 04/15/2018   Procedure: LEFT HEART CATH AND CORONARY ANGIOGRAPHY;  Surgeon: Lennette Bihari, MD;  Location: [facility] INVASIVE CV LAB;  Service: Cardiovascular;  Laterality: N/A;   LUMBAR LAMINECTOMY/DECOMPRESSION MICRODISCECTOMY N/A 07/19/2017   Procedure: LAMINECTOMY AND FORAMINOTOMY  LUMBAR TWO- LUMBAR THREE, LUMBAR THREE- LUMBAR FOUR;  Surgeon: Coletta Memos, MD;  Location: [facility] OR;  Service: Neurosurgery;  Laterality: N/A;   nerve transfer surgery to lwft hand Left    Nov. 2017   SHOULDER ARTHROSCOPY W/ ROTATOR CUFF REPAIR Left 2013   rotator cuff    SHOULDER INJECTION  03/2011   left, [facility]   SKIN CANCER EXCISION  07/1993   left lateral arm   SUPERFICIAL KERATECTOMY Right 05/1998   wrist   thumb injection  03/2011   left, [place]   TOE SURGERY Right    TONSILLECTOMY AND ADENOIDECTOMY     TOTAL HIP ARTHROPLASTY Left 07/08/2018   TOTAL HIP ARTHROPLASTY Left 07/08/2018   Procedure: LEFT TOTAL HIP ARTHROPLASTY;  Surgeon: Teryl Lucy, MD;  Location: [facility] OR;  Service: Orthopedics;  Laterality: Left;   TOTAL SHOULDER ARTHROPLASTY Left 12/25/2016   Procedure: TOTAL SHOULDER ARTHROPLASTY;  Surgeon: Ivin Booty  Dion Saucier, MD;  Location: [facility] OR;  Service: Orthopedics;  Laterality: Left;   TOTAL SHOULDER ARTHROPLASTY Right 11/05/2017   TOTAL SHOULDER ARTHROPLASTY Right 11/05/2017   Procedure: TOTAL SHOULDER ARTHROPLASTY;  Surgeon: Teryl Lucy, MD;  Location: [facility] OR;  Service: Orthopedics;  Laterality: Right;   ulnar nerve transfer  12/1999   left   ULNAR NERVE TRANSPOSITION Left     Allergies  Allergies  Allergen Reactions   Maprotiline Other (See Comments)    Tetracyclic antidepressants Pt unaware of this allergy   Sumycin [Tetracycline] Itching and Rash     History of Present Illness    Jose [name].  is a 87 year  old male with with a PMH CAD s/p LHC 03/2018 with 20% proximal LAD and 65% distal LAD , PAF (on Eliquis), multiple TIAs, MGUS, HTN, HLD who presents today for surgical clearance.  [name] was seen initially in June 2019 when he was admitted for complaint of chest pain.  He was previously followed by Dr. Beverely Pace at [facility] cardiology and is currently followed by Dr. Clifton James.  He underwent LHC that showed a 65% mid LAD lesion, 20% ostial to proximal LAD lesion and medical therapy was recommended.  2D echo was completed showing EF of 60 to 65% on 03/2018.  He was admitted to [facility] on 01/01/2023 with left-sided weakness and unsteady gait.  He was also endorsing hypotension.  He was he had a CT of the head to rule out acute stroke which showed no abnormalities but TIA was not excluded.  MRI of the head and neck was also completed with minimal bilateral carotid artery disease.  2D echo was completed showing EF of 55% with no valve disease.  He was last seen by Dr. Clifton James on 01/04/2023 for hospital follow-up and denied any chest pain and felt better since being off of labetalol and losartan.  Since last being seen in the office patient reports***.  Patient denies chest pain, palpitations, dyspnea, PND, orthopnea, nausea, vomiting, dizziness, syncope, edema, weight gain, or early satiety.    ***Notes: -Patient can hold Eliquis 3 days prior to scheduled total knee replacement. Home Medications    Current Outpatient Medications  Medication Sig Dispense Refill   acetaminophen (TYLENOL) 500 MG tablet Take 500 mg by mouth 3 (three) times daily.     Aflibercept (EYLEA IZ) 1 Dose by Intravitreal route See admin instructions. 1 injection into each eye every 8-10 weeks.     amLODipine (NORVASC) 5 MG tablet Take 5 mg by mouth daily.     apixaban (ELIQUIS) 5 MG TABS tablet TAKE 1 TABLET TWICE A DAY 180 tablet 0   ASCORBIC ACID PO Take 1 tablet by mouth daily. Vitamin C, unknown strength.     aspirin EC 81 MG tablet Take 81  mg by mouth daily.     atorvastatin (LIPITOR) 80 MG tablet Take 40 mg by mouth daily with supper.     azelastine (ASTELIN) 0.1 % nasal spray Place 1 spray into both nostrils daily. Use in each nostril as directed     Bromfenac Sodium (PROLENSA) 0.07 % SOLN Administer 1 drop into left eye three times daily. 3 mL 0   Bromfenac Sodium (PROLENSA) 0.07 % SOLN Place 1 drop into the left eye 3 (three) times daily for 3 days then stop 3 mL 1   Clobetasol Propionate 0.05 % shampoo Apply 1 application  topically See admin instructions. Apply topically to scalp 2-3 times a week as needed for severe  dry skin.     COCONUT OIL PO Take 1 capsule by mouth daily with lunch.     donepezil (ARICEPT) 10 MG tablet Take 1 tablet (10 mg total) by mouth at bedtime. 90 tablet 0   erythromycin ophthalmic ointment Place a small amount into the left eye 3 (three) times daily. 3.5 g 1   fluticasone (FLONASE) 50 MCG/ACT nasal spray Place 2 sprays into both nostrils daily. 16 g 1   isosorbide mononitrate (IMDUR) 30 MG 24 hr tablet TAKE 1 TABLET TWICE A DAY (DOSE INCREASE) 180 tablet 3   labetalol (NORMODYNE) 100 MG tablet Take 0.5 tablets (50 mg total) by mouth 2 (two) times daily. 45 tablet 3   memantine (NAMENDA) 5 MG tablet Take 1 tablet by mouth daily X 3 weeks, then take 1 tablet twice a day thereafter 60 tablet 1   Multiple Vitamins-Minerals (PRESERVISION AREDS 2) CAPS Take 1 capsule by mouth 2 (two) times daily.      nitroGLYCERIN (NITROSTAT) 0.4 MG SL tablet PLACE 1 TABLET (0.4 MG TOTAL) UNDER THE TONGUE EVERY 5 (FIVE) MINUTES X 3 DOSES AS NEEDED FOR CHEST PAIN. 75 tablet 2   Polyethyl Glycol-Propyl Glycol (SYSTANE OP) Place 1 drop into both eyes 4 (four) times daily as needed (dry eyes, irritated eyes.).     ROCKLATAN 0.02-0.005 % SOLN Place 1 drop into the right eye at bedtime.     tolterodine (DETROL LA) 2 MG 24 hr capsule Take 2 mg by mouth daily.     No current facility-administered medications for this visit.      Review of Systems  Please see the history of present illness.    (+)*** (+)***  All other systems reviewed and are otherwise negative except as noted above.  Physical Exam    Wt Readings from Last 3 Encounters:  01/04/23 231 lb 12.8 oz (105.1 kg)  01/01/23 225 lb 5 oz (102.2 kg)  12/03/22 230 lb (104.3 kg)   MV:HQION were no vitals filed for this visit.,There is no height or weight on file to calculate BMI.  Constitutional:      Appearance: Healthy appearance. Not in distress.  Neck:     Vascular: JVD normal.  Pulmonary:     Effort: Pulmonary effort is normal.     Breath sounds: No wheezing. No rales. Diminished in the bases Cardiovascular:     Normal rate. Regular rhythm. Normal S1. Normal S2.      Murmurs: There is no murmur.  Edema:    Peripheral edema absent.  Abdominal:     Palpations: Abdomen is soft non tender. There is no hepatomegaly.  Skin:    General: Skin is warm and dry.  Neurological:     General: No focal deficit present.     Mental Status: Alert and oriented to person, place and time.     Cranial Nerves: Cranial nerves are intact.  EKG/LABS/ Recent Cardiac Studies    ECG personally reviewed by me today - ***   Risk Assessment/Calculations:   {Does this patient have ATRIAL FIBRILLATION?:[id number]}        Lab Results  Component Value Date   WBC 6.2 03/28/2023   HGB 14.4 03/28/2023   HCT 44.4 03/28/2023   MCV 98.4 03/28/2023   PLT 175 03/28/2023   Lab Results  Component Value Date   CREATININE 0.80 03/28/2023   BUN 14 03/28/2023   NA 139 03/28/2023   K 3.8 03/28/2023   CL 101 03/28/2023   CO2 25 03/28/2023  Lab Results  Component Value Date   ALT 37 03/28/2023   AST 41 03/28/2023   ALKPHOS 55 03/28/2023   BILITOT 1.0 03/28/2023   Lab Results  Component Value Date   CHOL 127 01/02/2023   HDL 43 01/02/2023   LDLCALC 57 01/02/2023   LDLDIRECT 120 (H) 08/03/2010   TRIG 134 01/02/2023   CHOLHDL 3.0 01/02/2023    Lab Results   Component Value Date   HGBA1C 5.9 (H) 01/02/2023     Assessment & Plan    1.  Preoperative clearance: -Patient's RCRI score is 6.6%  2.  Paroxysmal AF:  3.  Coronary artery disease:  4.  Essential hypertension:      Disposition: Follow-up with Verne Carrow, MD or APP in *** months {Are you ordering a CV Procedure (e.g. stress test, cath, DCCV, TEE, etc)?   Press F2        :[id number]}   Medication Adjustments/Labs and Tests Ordered: Current medicines are reviewed at length with the patient today.  Concerns regarding medicines are outlined above.   Signed, Napoleon Form, Leodis Rains, NP 05/06/2023, 12:45 PM [facility]

## 2023-05-07 ENCOUNTER — Ambulatory Visit: Payer: Medicare Other | Admitting: Nurse Practitioner

## 2023-05-07 ENCOUNTER — Encounter: Payer: Self-pay | Admitting: Nurse Practitioner

## 2023-05-07 VITALS — BP 122/70 | HR 71 | Ht 74.0 in | Wt 231.0 lb

## 2023-05-07 DIAGNOSIS — Z01810 Encounter for preprocedural cardiovascular examination: Secondary | ICD-10-CM

## 2023-05-07 DIAGNOSIS — I2510 Atherosclerotic heart disease of native coronary artery without angina pectoris: Secondary | ICD-10-CM

## 2023-05-07 DIAGNOSIS — I10 Essential (primary) hypertension: Secondary | ICD-10-CM

## 2023-05-07 DIAGNOSIS — I480 Paroxysmal atrial fibrillation: Secondary | ICD-10-CM | POA: Diagnosis not present

## 2023-05-07 NOTE — Patient Instructions (Signed)
Medication Instructions:  Your physician recommends that you continue on your current medications as directed. Please refer to the Current Medication list given to you today. *If you need a refill on your cardiac medications before your next appointment, please call your pharmacy*   Lab Work: NONE ORDERED   Testing/Procedures: NONE ORDERED   Follow-Up: At [facility], you and your health needs are our priority.  As part of our continuing mission to provide you with exceptional heart care, we have created designated Provider Care Teams.  These Care Teams include your primary Cardiologist (physician) and Advanced Practice Providers (APPs -  Physician Assistants and Nurse Practitioners) who all work together to provide you with the care you need, when you need it.  We recommend signing up for the patient portal called "MyChart".  Sign up information is provided on this After Visit Summary.  MyChart is used to connect with patients for Virtual Visits (Telemedicine).  Patients are able to view lab/test results, encounter notes, upcoming appointments, etc.  Non-urgent messages can be sent to your provider as well.   To learn more about what you can do with MyChart, go to [email or web address].    Your next appointment:   FOLLOW UP AS SCHEDULED   Provider:   Verne Carrow, MD     Other Instructions YOU ARE CLEARED FOR YOUR UPCOMING PROCEDURES

## 2023-05-08 ENCOUNTER — Other Ambulatory Visit (HOSPITAL_BASED_OUTPATIENT_CLINIC_OR_DEPARTMENT_OTHER): Payer: Self-pay

## 2023-05-08 MED ORDER — PREDNISONE 10 MG PO TABS
ORAL_TABLET | ORAL | 0 refills | Status: DC
  Filled 2023-05-08: qty 21, 6d supply, fill #0

## 2023-05-08 MED ORDER — MECLIZINE HCL 25 MG PO TABS
25.0000 mg | ORAL_TABLET | Freq: Four times a day (QID) | ORAL | 1 refills | Status: DC | PRN
  Filled 2023-05-08: qty 30, 8d supply, fill #0

## 2023-05-30 DIAGNOSIS — J342 Deviated nasal septum: Secondary | ICD-10-CM | POA: Insufficient documentation

## 2023-05-30 DIAGNOSIS — R09A2 Foreign body sensation, throat: Secondary | ICD-10-CM | POA: Insufficient documentation

## 2023-06-27 ENCOUNTER — Other Ambulatory Visit (HOSPITAL_BASED_OUTPATIENT_CLINIC_OR_DEPARTMENT_OTHER): Payer: Self-pay

## 2023-06-27 MED ORDER — ERYTHROMYCIN 5 MG/GM OP OINT
1.0000 | TOPICAL_OINTMENT | Freq: Three times a day (TID) | OPHTHALMIC | 0 refills | Status: AC
  Filled 2023-06-27: qty 3.5, 3d supply, fill #0

## 2023-07-07 NOTE — Progress Notes (Unsigned)
No chief complaint on file.  History of Present Illness: 87 yo [name] with history of HTN, HLD, paroxysmal atrial fibrillation, TIA, MGUS and CAD here today for cardiac follow up. He had been followed by Dr. Beverely Pace in [facility] until June 2019. He was admitted to [facility] in June 2019 with fluttering in his chest with associated chest pain. Troponin was negative. Nuclear stress test with possible ischemia. Cardiac cath June 2019 with mild to moderate non-obstructive disease in the LAD, mild disease in the RCA. Echo 04/15/18 with LVEF=60-65%. No significant valve disease. He was discharged on Imdur. He is on Eliquis for paroxysmal atrial fibrillation. Coronary CTA July 2021 with mild non-obstructive disease in all three major vessels. He was admitted to [facility] on 01/01/23 with left sided weakness and unsteady gait. Head MRI and CT with acute stroke. TIA could not be excluded but his symptoms resolved. Echo 01/02/23 with LVEF=55%. No valve disease. His labetalol and Losartan were stopped. Neck CTA with minimal bilateral carotid artery plaque. Losartan was stopped due to hypotension.   He is here today for follow up. The patient denies any chest pain, dyspnea, palpitations, lower extremity edema, orthopnea, PND, dizziness, near syncope or syncope.   Primary Care Physician: Noralee Chars  Past Medical History:  Diagnosis Date   Arthritis    CAD (coronary artery disease)    04/15/18 65% dLAD, 20% pLAD, 30% 2nd diag, Normal EF   Dysrhythmia    afib   Gallstones 07/1996   Headache    couple times a year   History of CT scan of head 1997   small vessel disease   History of ETT 05/2002    no EKG changes   History of MRI of cervical spine 07/15/2002   Hypertension    Lumbar spondylolysis 12/17/2016   MGUS (monoclonal gammopathy of unknown significance) 04/11/2012   Occipital neuralgia of left side 12/09/2018   Peripheral neuropathy    Pneumonia 10/2014   PONV (postoperative nausea and vomiting)    once  many years ago   Primary localized osteoarthritis of left hip 07/08/2018   Primary localized osteoarthrosis of left shoulder 12/25/2016   Primary localized osteoarthrosis of right shoulder 11/05/2017   Rectal bleeding 12/2002   colonoscopy   Seizures (HCC)    last seizure 1976   Skin cancer    Sleep apnea    uses CPAP   Spinal stenosis, lumbar region with neurogenic claudication    Stroke [facility])    ?tia ?afib   TIA (transient ischemic attack)    Ulnar neuropathy at elbow 12/24/2014   Left   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 extremity 03/29/2015    Past Surgical History:  Procedure Laterality Date   ANTERIOR CERVICAL DECOMP/DISCECTOMY FUSION N/A 02/04/2015   Procedure: ANTERIOR CERVICAL DECOMPRESSION/DISCECTOMY FUSION CERVICAL FIVE-SIX,CERVICAL SIX-SEVEN;  Surgeon: Aliene Beams, MD;  Location: [facility] NEURO ORS;  Service: Neurosurgery;  Laterality: N/A;   arthroscopic knee surgery     BACK SURGERY     CATARACT EXTRACTION Bilateral    COLONOSCOPY W/ POLYPECTOMY  04/19/2003   tubular adenoma   EYE SURGERY     Bilateral Cataracts   GREAT TOE ARTHRODESIS, INTERPHALANGEAL JOINT Left 1978   JOINT REPLACEMENT     joint replacement on right thumb Right    LEFT HEART CATH AND CORONARY ANGIOGRAPHY N/A 04/15/2018   Procedure: LEFT HEART CATH AND CORONARY ANGIOGRAPHY;  Surgeon: Lennette Bihari, MD;  Location: [facility] INVASIVE CV LAB;  Service: Cardiovascular;  Laterality: N/A;   LUMBAR LAMINECTOMY/DECOMPRESSION MICRODISCECTOMY N/A 07/19/2017   Procedure: LAMINECTOMY AND FORAMINOTOMY  LUMBAR TWO- LUMBAR THREE, LUMBAR THREE- LUMBAR FOUR;  Surgeon: Coletta Memos, MD;  Location: [facility] OR;  Service: Neurosurgery;  Laterality: N/A;   nerve transfer surgery to lwft hand Left    Nov. 2017   SHOULDER ARTHROSCOPY W/ ROTATOR CUFF REPAIR Left 2013   rotator cuff    SHOULDER INJECTION  03/2011   left, [facility]   SKIN CANCER EXCISION  07/1993   left lateral arm   SUPERFICIAL KERATECTOMY Right 05/1998   wrist    thumb injection  03/2011   left, [facility]   TOE SURGERY Right    TONSILLECTOMY AND ADENOIDECTOMY     TOTAL HIP ARTHROPLASTY Left 07/08/2018   TOTAL HIP ARTHROPLASTY Left 07/08/2018   Procedure: LEFT TOTAL HIP ARTHROPLASTY;  Surgeon: Teryl Lucy, MD;  Location: [facility] OR;  Service: Orthopedics;  Laterality: Left;   TOTAL SHOULDER ARTHROPLASTY Left 12/25/2016   Procedure: TOTAL SHOULDER ARTHROPLASTY;  Surgeon: Teryl Lucy, MD;  Location: [facility] OR;  Service: Orthopedics;  Laterality: Left;   TOTAL SHOULDER ARTHROPLASTY Right 11/05/2017   TOTAL SHOULDER ARTHROPLASTY Right 11/05/2017   Procedure: TOTAL SHOULDER ARTHROPLASTY;  Surgeon: Teryl Lucy, MD;  Location: [facility] OR;  Service: Orthopedics;  Laterality: Right;   ulnar nerve transfer  12/1999   left   ULNAR NERVE TRANSPOSITION Left     Current Outpatient Medications  Medication Sig Dispense Refill   acetaminophen (TYLENOL) 500 MG tablet Take 500 mg by mouth 3 (three) times daily.     Aflibercept (EYLEA IZ) 1 Dose by Intravitreal route See admin instructions. 1 injection into each eye every 8-10 weeks.     amLODipine (NORVASC) 5 MG tablet Take 5 mg by mouth daily.     apixaban (ELIQUIS) 5 MG TABS tablet TAKE 1 TABLET TWICE A DAY 180 tablet 0   ASCORBIC ACID PO Take 1 tablet by mouth daily. Vitamin C, unknown strength.     aspirin EC 81 MG tablet Take 81 mg by mouth daily.     atorvastatin (LIPITOR) 80 MG tablet Take 40 mg by mouth daily with supper.     azelastine (ASTELIN) 0.1 % nasal spray Place 1 spray into both nostrils daily. Use in each nostril as directed     Bromfenac Sodium (PROLENSA) 0.07 % SOLN Administer 1 drop into left eye three times daily. 3 mL 0   Bromfenac Sodium (PROLENSA) 0.07 % SOLN Place 1 drop into the left eye 3 (three) times daily for 3 days then stop 3 mL 1   Clobetasol Propionate 0.05 % shampoo Apply 1 application  topically See admin instructions. Apply topically to scalp 2-3 times a week as needed for severe dry  skin.     COCONUT OIL PO Take 1 capsule by mouth daily with lunch.     donepezil (ARICEPT) 10 MG tablet Take 1 tablet (10 mg total) by mouth at bedtime. 90 tablet 0   erythromycin ophthalmic ointment Place a small amount into the left eye 3 (three) times daily. 3.5 g 1   fluticasone (FLONASE) 50 MCG/ACT nasal spray Place 2 sprays into both nostrils daily. 16 g 1   isosorbide mononitrate (IMDUR) 30 MG 24 hr tablet Take 30 mg by mouth daily.     labetalol (NORMODYNE) 100 MG tablet Take 0.5 tablets (50 mg total) by mouth 2 (two) times daily. 45 tablet 3   meclizine (ANTIVERT) 25 MG tablet Take 1 tablet (25 mg total)  by mouth every 6 (six) hours as needed for dizziness. 30 tablet 1   memantine (NAMENDA) 5 MG tablet Take 1 tablet by mouth daily X 3 weeks, then take 1 tablet twice a day thereafter 60 tablet 1   Multiple Vitamins-Minerals (PRESERVISION AREDS 2) CAPS Take 1 capsule by mouth 2 (two) times daily.      nitroGLYCERIN (NITROSTAT) 0.4 MG SL tablet PLACE 1 TABLET (0.4 MG TOTAL) UNDER THE TONGUE EVERY 5 (FIVE) MINUTES X 3 DOSES AS NEEDED FOR CHEST PAIN. 75 tablet 2   Polyethyl Glycol-Propyl Glycol (SYSTANE OP) Place 1 drop into both eyes 4 (four) times daily as needed (dry eyes, irritated eyes.).     ROCKLATAN 0.02-0.005 % SOLN Place 1 drop into the right eye at bedtime.     tolterodine (DETROL LA) 2 MG 24 hr capsule Take 2 mg by mouth daily.     No current facility-administered medications for this visit.    Allergies  Allergen Reactions   Maprotiline Other (See Comments)    Tetracyclic antidepressants Pt unaware of this allergy   Sumycin [Tetracycline] Itching and Rash    Social History   Socioeconomic History   Marital status: Married    Spouse name: Lewis Shock Rochers   Number of children: 2   Years of education: 19   Highest education level: Not on file  Occupational History   Occupation: retired    Associate Professor: UNEMPLOYED  Tobacco Use   Smoking status: Former    Current  packs/day: 0.00    Average packs/day: 2.0 packs/day for 13.0 years (26.0 ttl pk-yrs)    Types: Cigarettes    Start date: 03/06/1955    Quit date: 03/05/1968    Years since quitting: 55.3   Smokeless tobacco: Never  Vaping Use   Vaping status: Never Used  Substance and Sexual Activity   Alcohol use: Yes    Alcohol/week: 2.0 standard drinks of alcohol    Types: 2 Glasses of wine per week    Comment: social   Drug use: No   Sexual activity: Not Currently  Other Topics Concern   Not on file  Social History Narrative   Lives with 4th wife, Lewis Shock Rochers, married 2010   Retired, Actuary   Son, [place], [place], 2 children   Daughter, [place], in [place], 2 children   Patient is right handed   Patient drinks approximately 3-4 cups of caffeine daily   Social Determinants of Health   Financial Resource Strain: Not on file  Food Insecurity: No Food Insecurity (01/01/2023)   Hunger Vital Sign    Worried About Running Out of Food in the Last Year: Never true    Ran Out of Food in the Last Year: Never true  Transportation Needs: No Transportation Needs (01/01/2023)   PRAPARE - Administrator, Civil Service (Medical): No    Lack of Transportation (Non-Medical): No  Physical Activity: Not on file  Stress: Not on file  Social Connections: Not on file  Intimate Partner Violence: Not At Risk (01/01/2023)   Humiliation, Afraid, Rape, and Kick questionnaire    Fear of Current or Ex-Partner: No    Emotionally Abused: No    Physically Abused: No    Sexually Abused: No    Family History  Problem Relation Age of Onset   Dementia Mother 50       vascular   Transient ischemic attack Mother    Dementia Father  after hip fracture   Hypertension Sister        skin cancer   Seizures Neg Hx    Sleep apnea Neg Hx    Neuropathy Neg Hx     Review of Systems:  As stated in the HPI and otherwise negative.   There were no vitals taken for this  visit.  Physical Examination: General: Well developed, well nourished, NAD  HEENT: OP clear, mucus membranes moist  SKIN: warm, dry. No rashes. Neuro: No focal deficits  Musculoskeletal: Muscle strength 5/5 all ext  Psychiatric: Mood and affect normal  Neck: No JVD, no carotid bruits, no thyromegaly, no lymphadenopathy.  Lungs:Clear bilaterally, no wheezes, rhonci, crackles Cardiovascular: Regular rate and rhythm. No murmurs, gallops or rubs. Abdomen:Soft. Bowel sounds present. Non-tender.  Extremities: No lower extremity edema. Pulses are 2 + in the bilateral DP/PT.  EKG:  EKG is *** ordered today. The ekg ordered today demonstrates   Echo  01/02/23: 1. Left ventricular ejection fraction, by estimation, is 55%. The left  ventricle has low normal function. The left ventricle has no regional wall  motion abnormalities. Left ventricular diastolic parameters are consistent  with Grade I diastolic  dysfunction (impaired relaxation).   2. Right ventricular systolic function is normal. The right ventricular  size is normal.   3. Left atrial size was mildly dilated.   4. The mitral valve is normal in structure. No evidence of mitral valve  regurgitation. No evidence of mitral stenosis.   5. The aortic valve is normal in structure. Aortic valve regurgitation is  not visualized. No aortic stenosis is present.   6. The inferior vena cava is dilated in size with >50% respiratory  variability, suggesting right atrial pressure of 8 mmHg.   Recent Labs: 03/28/2023: ALT 37; BUN 14; Creatinine, Ser 0.80; Hemoglobin 14.4; Platelets 175; Potassium 3.8; Sodium 139   Lipid Panel    Component Value Date/Time   CHOL 127 01/02/2023 0738   CHOL 126 04/18/2021 1058   TRIG 134 01/02/2023 0738   HDL 43 01/02/2023 0738   HDL 56 04/18/2021 1058   CHOLHDL 3.0 01/02/2023 0738   VLDL 27 01/02/2023 0738   LDLCALC 57 01/02/2023 0738   LDLCALC 55 04/18/2021 1058   LDLDIRECT 120 (H) 08/03/2010 1719      Wt Readings from Last 3 Encounters:  05/07/23 104.8 kg  01/04/23 105.1 kg  01/01/23 102.2 kg    Assessment and Plan:   1. CAD with stable angina: He has mild CAD by cath in 2019 and coronary CTA in 2021. No chest pain suggestive of angina. Will continue ASA, statin, Imdur and beta blocker. ensive.   2. Hyperlipidemia: LDL 57 in March 2024. Continue statin.   3. HTN: BP is controlled on current therapy. No changes   4. Atrial fibrillation, paroxysmal: He is in sinus today. Continue *** beta blocker and Eliquis  Labs/ tests ordered today include:  No orders of the defined types were placed in this encounter.  Disposition:   F/U with me  in 12  months   Signed, Verne Carrow, MD 07/07/2023 1:40 PM    [facility] [address] [place], [place], [place]  [address] Phone: [phone]; Fax: [phone]

## 2023-07-08 ENCOUNTER — Ambulatory Visit: Payer: Medicare Other | Attending: Cardiovascular Disease | Admitting: Cardiovascular Disease

## 2023-07-08 ENCOUNTER — Encounter: Payer: Self-pay | Admitting: Cardiovascular Disease

## 2023-07-08 VITALS — BP 115/70 | HR 67 | Ht 74.0 in | Wt 229.6 lb

## 2023-07-08 DIAGNOSIS — I10 Essential (primary) hypertension: Secondary | ICD-10-CM | POA: Insufficient documentation

## 2023-07-08 DIAGNOSIS — Z01810 Encounter for preprocedural cardiovascular examination: Secondary | ICD-10-CM | POA: Insufficient documentation

## 2023-07-08 DIAGNOSIS — I480 Paroxysmal atrial fibrillation: Secondary | ICD-10-CM | POA: Insufficient documentation

## 2023-07-08 DIAGNOSIS — I2510 Atherosclerotic heart disease of native coronary artery without angina pectoris: Secondary | ICD-10-CM | POA: Diagnosis not present

## 2023-07-08 DIAGNOSIS — E7800 Pure hypercholesterolemia, unspecified: Secondary | ICD-10-CM | POA: Insufficient documentation

## 2023-07-08 NOTE — Patient Instructions (Signed)
Medication Instructions:  No changes *If you need a refill on your cardiac medications before your next appointment, please call your pharmacy*   Lab Work: none   Testing/Procedures: none   Follow-Up: At [facility], you and your health needs are our priority.  As part of our continuing mission to provide you with exceptional heart care, we have created designated Provider Care Teams.  These Care Teams include your primary Cardiologist (physician) and Advanced Practice Providers (APPs -  Physician Assistants and Nurse Practitioners) who all work together to provide you with the care you need, when you need it.   Your next appointment:   12 month(s)  Provider:   Christopher McAlhany, MD   

## 2023-07-19 ENCOUNTER — Other Ambulatory Visit: Payer: Self-pay

## 2023-07-19 ENCOUNTER — Emergency Department (HOSPITAL_COMMUNITY)
Admission: EM | Admit: 2023-07-19 | Discharge: 2023-07-19 | Disposition: A | Discharge status: Home / Self Care | Payer: Medicare Other | Attending: Emergency Medicine | Admitting: Emergency Medicine

## 2023-07-19 ENCOUNTER — Emergency Department (HOSPITAL_COMMUNITY): Payer: Medicare Other

## 2023-07-19 ENCOUNTER — Encounter (HOSPITAL_COMMUNITY): Payer: Self-pay

## 2023-07-19 DIAGNOSIS — R0602 Shortness of breath: Secondary | ICD-10-CM | POA: Insufficient documentation

## 2023-07-19 DIAGNOSIS — Z8673 Personal history of transient ischemic attack (TIA), and cerebral infarction without residual deficits: Secondary | ICD-10-CM | POA: Insufficient documentation

## 2023-07-19 DIAGNOSIS — Z85828 Personal history of other malignant neoplasm of skin: Secondary | ICD-10-CM | POA: Insufficient documentation

## 2023-07-19 DIAGNOSIS — Z1152 Encounter for screening for COVID-19: Secondary | ICD-10-CM | POA: Diagnosis not present

## 2023-07-19 DIAGNOSIS — Z7982 Long term (current) use of aspirin: Secondary | ICD-10-CM | POA: Insufficient documentation

## 2023-07-19 DIAGNOSIS — I2510 Atherosclerotic heart disease of native coronary artery without angina pectoris: Secondary | ICD-10-CM | POA: Insufficient documentation

## 2023-07-19 DIAGNOSIS — K5900 Constipation, unspecified: Secondary | ICD-10-CM | POA: Insufficient documentation

## 2023-07-19 DIAGNOSIS — I10 Essential (primary) hypertension: Secondary | ICD-10-CM | POA: Diagnosis not present

## 2023-07-19 DIAGNOSIS — Z79899 Other long term (current) drug therapy: Secondary | ICD-10-CM | POA: Insufficient documentation

## 2023-07-19 LAB — COMPREHENSIVE METABOLIC PANEL
ALT: 32 U/L (ref 0–44)
AST: 34 U/L (ref 15–41)
Albumin: 4.2 g/dL (ref 3.5–5.0)
Alkaline Phosphatase: 58 U/L (ref 38–126)
Anion gap: 10 (ref 5–15)
BUN: 19 mg/dL (ref 8–23)
CO2: 28 mmol/L (ref 22–32)
Calcium: 9.3 mg/dL (ref 8.9–10.3)
Chloride: 103 mmol/L (ref 98–111)
Creatinine, Ser: 0.81 mg/dL (ref 0.61–1.24)
GFR, Estimated: >60 mL/min (ref >60)
Glucose, Bld: 105 mg/dL — ABNORMAL HIGH (ref 70–99)
Potassium: 3.6 mmol/L (ref 3.5–5.1)
Sodium: 141 mmol/L (ref 135–145)
Total Bilirubin: 1.2 mg/dL (ref 0.3–1.2)
Total Protein: 6.6 g/dL (ref 6.5–8.1)

## 2023-07-19 LAB — LIPASE, BLOOD: Lipase: 28 U/L (ref 11–51)

## 2023-07-19 LAB — CBC WITH DIFFERENTIAL/PLATELET
Abs Immature Granulocytes: 0.03 10*3/uL (ref 0.00–0.07)
Basophils Absolute: 0.1 10*3/uL (ref 0.0–0.1)
Basophils Relative: 1 %
Eosinophils Absolute: 0.1 10*3/uL (ref 0.0–0.5)
Eosinophils Relative: 2 %
HCT: 40.8 % (ref 39.0–52.0)
Hemoglobin: 13.3 g/dL (ref 13.0–17.0)
Immature Granulocytes: 0 %
Lymphocytes Relative: 22 %
Lymphs Abs: 1.6 10*3/uL (ref 0.7–4.0)
MCH: 32.5 pg (ref 26.0–34.0)
MCHC: 32.6 g/dL (ref 30.0–36.0)
MCV: 99.8 fL (ref 80.0–100.0)
Monocytes Absolute: 0.7 10*3/uL (ref 0.1–1.0)
Monocytes Relative: 9 %
Neutro Abs: 4.7 10*3/uL (ref 1.7–7.7)
Neutrophils Relative %: 66 %
Platelets: 201 10*3/uL (ref 150–400)
RBC: 4.09 MIL/uL — ABNORMAL LOW (ref 4.22–5.81)
RDW: 13.5 % (ref 11.5–15.5)
WBC: 7.1 10*3/uL (ref 4.0–10.5)
nRBC: 0 % (ref 0.0–0.2)

## 2023-07-19 LAB — TROPONIN I (HIGH SENSITIVITY): Troponin I (High Sensitivity): 12 ng/L (ref <18)

## 2023-07-19 LAB — URINALYSIS, ROUTINE W REFLEX MICROSCOPIC
Bilirubin Urine: NEGATIVE
Glucose, UA: NEGATIVE mg/dL
Hgb urine dipstick: NEGATIVE
Ketones, ur: NEGATIVE mg/dL
Leukocytes,Ua: NEGATIVE
Nitrite: NEGATIVE
Protein, ur: NEGATIVE mg/dL
Specific Gravity, Urine: 1.014 (ref 1.005–1.030)
pH: 7 (ref 5.0–8.0)

## 2023-07-19 LAB — BRAIN NATRIURETIC PEPTIDE: B Natriuretic Peptide: 37.7 pg/mL (ref 0.0–100.0)

## 2023-07-19 LAB — SARS CORONAVIRUS 2 BY RT PCR: SARS Coronavirus 2 by RT PCR: NEGATIVE

## 2023-07-19 LAB — D-DIMER, QUANTITATIVE: D-Dimer, Quant: <0.27 ug/mL-FEU (ref 0.00–0.50)

## 2023-07-19 MED ORDER — FLEET ENEMA RE ENEM
1.0000 | ENEMA | Freq: Once | RECTAL | Status: AC
  Administered 2023-07-19: 1 via RECTAL
  Filled 2023-07-19: qty 1

## 2023-07-19 MED ORDER — BISACODYL 10 MG RE SUPP: 10.0000 mg | RECTAL | 0 refills | Status: DC | PRN

## 2023-07-19 MED ORDER — DOCUSATE SODIUM 100 MG PO CAPS: 100.0000 mg | ORAL_CAPSULE | Freq: Two times a day (BID) | ORAL | 0 refills | Status: DC

## 2023-07-19 NOTE — ED Provider Notes (Signed)
[place] EMERGENCY DEPARTMENT AT [facility] Provider Note   CSN: [id number] Arrival date & time: 07/19/23  1051     History  Chief complaint: Shortness of breath  Jose [name]. is a 87 y.o. male.  HPI   Patient has a history of hypertension monoclonal gammopathy, skin cancer, seizures, sleep apnea, coronary artery disease, stroke who presents to the ED with complaints of an episode of shortness of breath.  Patient states he has been having issues with constipation.  He has not had a normal bowel movement in about a week.  He is not had any vomiting.  No trouble with his appetite.  No dysuria.  He has been having some abdominal discomfort.  Patient states today however he was sitting on his chair and he suddenly became very short of breath.  Patient felt like he could not get any air in.  This lasted for several minutes.  Patient denies any chest pain.  Patient thinks the breathing is related to his system backing up.  Patient thought that the constipation could be causing issues with his breathing.  Right now he complains of some nasal congestion but otherwise is breathing normally  Home Medications Prior to Admission medications   Medication Sig Start Date End Date Taking? Authorizing Provider  bisacodyl (DULCOLAX) 10 MG suppository Place 1 suppository (10 mg total) rectally as needed for moderate constipation. 07/19/23  Yes Linwood Dibbles, MD  docusate sodium (COLACE) 100 MG capsule Take 1 capsule (100 mg total) by mouth every 12 (twelve) hours. 07/19/23  Yes Linwood Dibbles, MD  acetaminophen (TYLENOL) 500 MG tablet Take 500 mg by mouth 3 (three) times daily.    [provider]  Aflibercept (EYLEA IZ) 1 Dose by Intravitreal route See admin instructions. 1 injection into each eye every 8-10 weeks.    [provider]  amLODipine (NORVASC) 5 MG tablet Take 5 mg by mouth daily. 06/08/19   [provider]  apixaban (ELIQUIS) 5 MG TABS tablet TAKE 1 TABLET  TWICE A DAY 12/20/21   Kathleene Hazel, MD  ASCORBIC ACID PO Take 1 tablet by mouth daily. Vitamin C, unknown strength.    [provider]  aspirin EC 81 MG tablet Take 81 mg by mouth daily.    [provider]  atorvastatin (LIPITOR) 80 MG tablet Take 40 mg by mouth daily with supper.    [provider]  azelastine (ASTELIN) 0.1 % nasal spray Place 1 spray into both nostrils daily. Use in each nostril as directed    [provider]  Bromfenac Sodium (PROLENSA) 0.07 % SOLN Administer 1 drop into left eye three times daily. 12/14/22     Bromfenac Sodium (PROLENSA) 0.07 % SOLN Place 1 drop into the left eye 3 (three) times daily for 3 days then stop 03/27/23     Clobetasol Propionate 0.05 % shampoo Apply 1 application  topically See admin instructions. Apply topically to scalp 2-3 times a week as needed for severe dry skin.    [provider]  COCONUT OIL PO Take 1 capsule by mouth daily with lunch.    [provider]  donepezil (ARICEPT) 10 MG tablet Take 1 tablet (10 mg total) by mouth at bedtime. 06/21/22     erythromycin ophthalmic ointment Place a small amount into the left eye 3 (three) times daily. 04/25/23     fluticasone (FLONASE) 50 MCG/ACT nasal spray Place 2 sprays into both nostrils daily. 08/29/22  isosorbide mononitrate (IMDUR) 30 MG 24 hr tablet Take 30 mg by mouth daily.    [provider]  labetalol (NORMODYNE) 100 MG tablet Take 0.5 tablets (50 mg total) by mouth 2 (two) times daily. 01/11/23   Kathleene Hazel, MD  meclizine (ANTIVERT) 25 MG tablet Take 1 tablet (25 mg total) by mouth every 6 (six) hours as needed for dizziness. 05/08/23     memantine (NAMENDA) 5 MG tablet Take 1 tablet by mouth daily X 3 weeks, then take 1 tablet twice a day thereafter 07/23/22     Multiple Vitamins-Minerals (PRESERVISION AREDS 2) CAPS Take 1 capsule by mouth 2 (two) times daily.     [provider]  nitroGLYCERIN  (NITROSTAT) 0.4 MG SL tablet PLACE 1 TABLET (0.4 MG TOTAL) UNDER THE TONGUE EVERY 5 (FIVE) MINUTES X 3 DOSES AS NEEDED FOR CHEST PAIN. 03/03/21   Kathleene Hazel, MD  Polyethyl Glycol-Propyl Glycol (SYSTANE OP) Place 1 drop into both eyes 4 (four) times daily as needed (dry eyes, irritated eyes.).    [provider]  ROCKLATAN 0.02-0.005 % SOLN Place 1 drop into the right eye at bedtime. 04/10/19   [provider]  tolterodine (DETROL LA) 2 MG 24 hr capsule Take 2 mg by mouth daily.    [provider]      Allergies    Maprotiline and Sumycin [tetracycline]    Review of Systems   Review of Systems  Physical Exam Updated Vital Signs BP 127/80   Pulse (!) 58   Temp 98 F (36.7 C) (Oral)   Resp 13   Ht 1.88 m (6\' 2" )   Wt 104.1 kg   SpO2 95%   BMI 29.48 kg/m  Physical Exam Vitals and nursing note reviewed.  Constitutional:      Appearance: He is well-developed. He is not diaphoretic.  HENT:     Head: Normocephalic and atraumatic.     Right Ear: External ear normal.     Left Ear: External ear normal.  Eyes:     General: No scleral icterus.       Right eye: No discharge.        Left eye: No discharge.     Conjunctiva/sclera: Conjunctivae normal.  Neck:     Trachea: No tracheal deviation.  Cardiovascular:     Rate and Rhythm: Normal rate and regular rhythm.  Pulmonary:     Effort: Pulmonary effort is normal. No respiratory distress.     Breath sounds: Normal breath sounds. No stridor. No wheezing or rales.  Abdominal:     General: Bowel sounds are normal. There is no distension.     Palpations: Abdomen is soft.     Tenderness: There is no abdominal tenderness. There is no guarding or rebound.  Musculoskeletal:        General: No tenderness or deformity.     Cervical back: Neck supple.     Comments: No fecal impaction appreciated, patient does have some formed stool in the rectal vault  Skin:    General: Skin is warm and dry.      Findings: No rash.  Neurological:     General: No focal deficit present.     Mental Status: He is alert.     Cranial Nerves: No cranial nerve deficit, dysarthria or facial asymmetry.     Sensory: No sensory deficit.     Motor: No abnormal muscle tone or seizure activity.     Coordination: Coordination normal.  Psychiatric:  Mood and Affect: Mood normal.     ED Results / Procedures / Treatments   Labs (all labs ordered are listed, but only abnormal results are displayed) Labs Reviewed  COMPREHENSIVE METABOLIC PANEL - Abnormal; Notable for the following components:      Result Value   Glucose, Bld 105 (*)    All other components within normal limits  CBC WITH DIFFERENTIAL/PLATELET - Abnormal; Notable for the following components:   RBC 4.09 (*)    All other components within normal limits  URINALYSIS, ROUTINE W REFLEX MICROSCOPIC - Abnormal; Notable for the following components:   APPearance CLOUDY (*)    All other components within normal limits  SARS CORONAVIRUS 2 BY RT PCR  LIPASE, BLOOD  BRAIN NATRIURETIC PEPTIDE  D-DIMER, QUANTITATIVE  TROPONIN I (HIGH SENSITIVITY)  TROPONIN I (HIGH SENSITIVITY)    EKG None  Radiology DG Abd Acute W/Chest  Result Date: 07/19/2023 CLINICAL DATA:  Dyspnea and abdominal pain. EXAM: DG ABDOMEN ACUTE WITH 1 VIEW CHEST COMPARISON:  Chest and right rib radiographs 10/10/2021. FINDINGS: No evidence of dilated bowel loops or free intraperitoneal air. No radiopaque calculi or other significant radiographic abnormality is seen. Low lung volumes accentuate the pulmonary vasculature and cardiomediastinal silhouette. No consolidation or pulmonary edema. No pleural effusion or pneumothorax. Prior left total hip arthroplasty. Moderate degenerative changes of the lower lumbar spine. IMPRESSION: 1. Nonobstructive bowel gas pattern. 2. No acute cardiopulmonary disease. Electronically Signed   By: Orvan Falconer M.D.   On: 07/19/2023 14:54     Procedures Procedures    Medications Ordered in ED Medications  sodium phosphate (FLEET) enema 1 enema (has no administration in time range)    ED Course/ Medical Decision Making/ A&P Clinical Course as of 07/19/23 1538  Fri Jul 19, 2023  1416 Urinalysis normal.  Metabolic panel normal.  CBC normal.  D-dimer normal.  Troponin lipase normal [JK]  1522 Chest x-ray without signs of obstruction.  No acute cardiac findings [JK]  1522 Reviewed findings with patient.  Patient states he is no longer having any breathing difficulties.  He is very concerned about his constipation.  Requests an enema while he is here [JK]    Clinical Course User Index [JK] Linwood Dibbles, MD                                 Medical Decision Making Problems Addressed: Constipation, unspecified constipation type: undiagnosed new problem with uncertain prognosis  Amount and/or Complexity of Data Reviewed Labs: ordered. Radiology: ordered.  Risk OTC drugs.   Patient presented to ED with complaints of an episode of shortness of breath.  Patient states this occurred in the setting of having trouble with constipation recently.  Patient states his breathing difficulty resolved.  ED workup is reassuring.  There is no pneumonia.  D-dimer is negative arguing against PE.  BNP is normal.  Troponin is normal and EKG is normal.  No signs of pneumonia CHF or cardiac abnormality.  Normal x-ray does not show any signs of obstruction.  Patient's abdomen is nontender.  Rectal exam did show formed stool but no signs of severe fecal impaction or thrombosed hemorrhoids.  Patient is requesting an enema.  Will give him an enema here in the ED.  Plan on discharge home with medications for his constipation.        Final Clinical Impression(s) / ED Diagnoses Final diagnoses:  Constipation, unspecified constipation type  Rx / DC Orders ED Discharge Orders          Ordered    docusate sodium (COLACE) 100 MG capsule   Every 12 hours        07/19/23 1537    bisacodyl (DULCOLAX) 10 MG suppository  As needed        07/19/23 1537              Linwood Dibbles, MD 07/19/23 1538

## 2023-07-19 NOTE — Discharge Instructions (Addendum)
Try to drink plenty of fluids and increase fiber in your diet.  You can continue your MiraLAX medication.  Start taking the Dulcolax and the Colace as well.  Follow-up with your primary care doctor to be rechecked.

## 2023-07-19 NOTE — ED Triage Notes (Signed)
The pt was [facility]. The pts wife called and stated pt has not had a bm since 9/13. The pt has been c/o abd pain 2/10 and shob, but has no respiratory hx.Lungs clear. VS O2 Sats 98% RA, stomach feels ridget and firm, B/P 160/100, does have PMHx Afib, takes eliquis and does have hemorrhoids, P 80, CBG 154. 20g LFA. Denies any n/v.

## 2023-07-29 NOTE — Progress Notes (Unsigned)
PATIENT: Jose [name]. DOB: 1936/02/06  REASON FOR VISIT: follow up HISTORY FROM: patient  No chief complaint on file.    HISTORY OF PRESENT ILLNESS:  07/29/23 ALL:  Mithran Strike. is a 87 y.o. male here today for follow up for OSA on CPAP.    He is followed by Dr Alton Revere, neurology with [facility], for AD. He continues memantine and donepezil. Last follow up 04/2023. Follow up scheduled 10/2023.   HISTORY: (copied from previous note)   REVIEW OF SYSTEMS: Out of a complete 14 system review of symptoms, the patient complains only of the following symptoms, and all other reviewed systems are negative.  ESS:  ALLERGIES: Allergies  Allergen Reactions   Maprotiline Other (See Comments)    Tetracyclic antidepressants Pt unaware of this allergy   Sumycin [Tetracycline] Itching and Rash    HOME MEDICATIONS: Outpatient Medications Prior to Visit  Medication Sig Dispense Refill   acetaminophen (TYLENOL) 500 MG tablet Take 500 mg by mouth 3 (three) times daily.     Aflibercept (EYLEA IZ) 1 Dose by Intravitreal route See admin instructions. 1 injection into each eye every 8-10 weeks.     amLODipine (NORVASC) 5 MG tablet Take 5 mg by mouth daily.     apixaban (ELIQUIS) 5 MG TABS tablet TAKE 1 TABLET TWICE A DAY 180 tablet 0   ASCORBIC ACID PO Take 1 tablet by mouth daily. Vitamin C, unknown strength.     aspirin EC 81 MG tablet Take 81 mg by mouth daily.     atorvastatin (LIPITOR) 80 MG tablet Take 40 mg by mouth daily with supper.     azelastine (ASTELIN) 0.1 % nasal spray Place 1 spray into both nostrils daily. Use in each nostril as directed     bisacodyl (DULCOLAX) 10 MG suppository Place 1 suppository (10 mg total) rectally as needed for moderate constipation. 12 suppository 0   Bromfenac Sodium (PROLENSA) 0.07 % SOLN Administer 1 drop into left eye three times daily. 3 mL 0   Bromfenac Sodium (PROLENSA) 0.07 % SOLN Place 1 drop into the left eye 3 (three) times daily for  3 days then stop 3 mL 1   Clobetasol Propionate 0.05 % shampoo Apply 1 application  topically See admin instructions. Apply topically to scalp 2-3 times a week as needed for severe dry skin.     COCONUT OIL PO Take 1 capsule by mouth daily with lunch.     docusate sodium (COLACE) 100 MG capsule Take 1 capsule (100 mg total) by mouth every 12 (twelve) hours. 60 capsule 0   donepezil (ARICEPT) 10 MG tablet Take 1 tablet (10 mg total) by mouth at bedtime. 90 tablet 0   erythromycin ophthalmic ointment Place a small amount into the left eye 3 (three) times daily. 3.5 g 1   fluticasone (FLONASE) 50 MCG/ACT nasal spray Place 2 sprays into both nostrils daily. 16 g 1   isosorbide mononitrate (IMDUR) 30 MG 24 hr tablet Take 30 mg by mouth daily.     labetalol (NORMODYNE) 100 MG tablet Take 0.5 tablets (50 mg total) by mouth 2 (two) times daily. 45 tablet 3   meclizine (ANTIVERT) 25 MG tablet Take 1 tablet (25 mg total) by mouth every 6 (six) hours as needed for dizziness. 30 tablet 1   memantine (NAMENDA) 5 MG tablet Take 1 tablet by mouth daily X 3 weeks, then take 1 tablet twice a day thereafter 60 tablet 1   Multiple  Vitamins-Minerals (PRESERVISION AREDS 2) CAPS Take 1 capsule by mouth 2 (two) times daily.      nitroGLYCERIN (NITROSTAT) 0.4 MG SL tablet PLACE 1 TABLET (0.4 MG TOTAL) UNDER THE TONGUE EVERY 5 (FIVE) MINUTES X 3 DOSES AS NEEDED FOR CHEST PAIN. 75 tablet 2   Polyethyl Glycol-Propyl Glycol (SYSTANE OP) Place 1 drop into both eyes 4 (four) times daily as needed (dry eyes, irritated eyes.).     ROCKLATAN 0.02-0.005 % SOLN Place 1 drop into the right eye at bedtime.     tolterodine (DETROL LA) 2 MG 24 hr capsule Take 2 mg by mouth daily.     No facility-administered medications prior to visit.    PAST MEDICAL HISTORY: Past Medical History:  Diagnosis Date   Arthritis    CAD (coronary artery disease)    04/15/18 65% dLAD, 20% pLAD, 30% 2nd diag, Normal EF   Dysrhythmia    afib    Gallstones 07/1996   Headache    couple times a year   History of CT scan of head 1997   small vessel disease   History of ETT 05/2002    no EKG changes   History of MRI of cervical spine 07/15/2002   Hypertension    Lumbar spondylolysis 12/17/2016   MGUS (monoclonal gammopathy of unknown significance) 04/11/2012   Occipital neuralgia of left side 12/09/2018   Peripheral neuropathy    Pneumonia 10/2014   PONV (postoperative nausea and vomiting)    once many years ago   Primary localized osteoarthritis of left hip 07/08/2018   Primary localized osteoarthrosis of left shoulder 12/25/2016   Primary localized osteoarthrosis of right shoulder 11/05/2017   Rectal bleeding 12/2002   colonoscopy   Seizures (HCC)    last seizure 1976   Skin cancer    Sleep apnea    uses CPAP   Spinal stenosis, lumbar region with neurogenic claudication    Stroke [facility])    ?tia ?afib   TIA (transient ischemic attack)    Ulnar neuropathy at elbow 12/24/2014   Left   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 extremity 03/29/2015    PAST SURGICAL HISTORY: Past Surgical History:  Procedure Laterality Date   ANTERIOR CERVICAL DECOMP/DISCECTOMY FUSION N/A 02/04/2015   Procedure: ANTERIOR CERVICAL DECOMPRESSION/DISCECTOMY FUSION CERVICAL FIVE-SIX,CERVICAL SIX-SEVEN;  Surgeon: Aliene Beams, MD;  Location: [facility];  Service: Neurosurgery;  Laterality: N/A;   arthroscopic knee surgery     BACK SURGERY     CATARACT EXTRACTION Bilateral    COLONOSCOPY W/ POLYPECTOMY  04/19/2003   tubular adenoma   EYE SURGERY     Bilateral Cataracts   GREAT TOE ARTHRODESIS, INTERPHALANGEAL JOINT Left 1978   JOINT REPLACEMENT     joint replacement on right thumb Right    LEFT HEART CATH AND CORONARY ANGIOGRAPHY N/A 04/15/2018   Procedure: LEFT HEART CATH AND CORONARY ANGIOGRAPHY;  Surgeon: Lennette Bihari, MD;  Location: [facility];  Service: Cardiovascular;  Laterality: N/A;   LUMBAR LAMINECTOMY/DECOMPRESSION  MICRODISCECTOMY N/A 07/19/2017   Procedure: LAMINECTOMY AND FORAMINOTOMY  LUMBAR TWO- LUMBAR THREE, LUMBAR THREE- LUMBAR FOUR;  Surgeon: Coletta Memos, MD;  Location: [facility] OR;  Service: Neurosurgery;  Laterality: N/A;   nerve transfer surgery to lwft hand Left    Nov. 2017   SHOULDER ARTHROSCOPY W/ ROTATOR CUFF REPAIR Left 2013   rotator cuff    SHOULDER INJECTION  03/2011   left, [place]   SKIN CANCER EXCISION  07/1993   left lateral  arm   SUPERFICIAL KERATECTOMY Right 05/1998   wrist   thumb injection  03/2011   left, [place]   TOE SURGERY Right    TONSILLECTOMY AND ADENOIDECTOMY     TOTAL HIP ARTHROPLASTY Left 07/08/2018   TOTAL HIP ARTHROPLASTY Left 07/08/2018   Procedure: LEFT TOTAL HIP ARTHROPLASTY;  Surgeon: Teryl Lucy, MD;  Location: [facility] OR;  Service: Orthopedics;  Laterality: Left;   TOTAL SHOULDER ARTHROPLASTY Left 12/25/2016   Procedure: TOTAL SHOULDER ARTHROPLASTY;  Surgeon: Teryl Lucy, MD;  Location: [facility] OR;  Service: Orthopedics;  Laterality: Left;   TOTAL SHOULDER ARTHROPLASTY Right 11/05/2017   TOTAL SHOULDER ARTHROPLASTY Right 11/05/2017   Procedure: TOTAL SHOULDER ARTHROPLASTY;  Surgeon: Teryl Lucy, MD;  Location: [facility] OR;  Service: Orthopedics;  Laterality: Right;   ulnar nerve transfer  12/1999   left   ULNAR NERVE TRANSPOSITION Left     FAMILY HISTORY: Family History  Problem Relation Age of Onset   Dementia Mother 59       vascular   Transient ischemic attack Mother    Dementia Father        after hip fracture   Hypertension Sister        skin cancer   Seizures Neg Hx    Sleep apnea Neg Hx    Neuropathy Neg Hx     SOCIAL HISTORY: Social History   Socioeconomic History   Marital status: Married    Spouse name: Lewis Shock Rochers   Number of children: 2   Years of education: 19   Highest education level: Not on file  Occupational History   Occupation: retired    Associate Professor: UNEMPLOYED  Tobacco Use   Smoking status: Former    Current  packs/day: 0.00    Average packs/day: 2.0 packs/day for 13.0 years (26.0 ttl pk-yrs)    Types: Cigarettes    Start date: 03/06/1955    Quit date: 03/05/1968    Years since quitting: 55.4   Smokeless tobacco: Never  Vaping Use   Vaping status: Never Used  Substance and Sexual Activity   Alcohol use: Yes    Alcohol/week: 2.0 standard drinks of alcohol    Types: 2 Glasses of wine per week    Comment: social   Drug use: No   Sexual activity: Not Currently  Other Topics Concern   Not on file  Social History Narrative   Lives with 4th wife, Lewis Shock Rochers, married 2010   Retired, Actuary   Son, [place], [place], 2 children   Daughter, [place], in [place], 2 children   Patient is right handed   Patient drinks approximately 3-4 cups of caffeine daily   Social Determinants of Health   Financial Resource Strain: Not on file  Food Insecurity: No Food Insecurity (01/01/2023)   Hunger Vital Sign    Worried About Running Out of Food in the Last Year: Never true    Ran Out of Food in the Last Year: Never true  Transportation Needs: No Transportation Needs (01/01/2023)   PRAPARE - Administrator, Civil Service (Medical): No    Lack of Transportation (Non-Medical): No  Physical Activity: Not on file  Stress: Not on file  Social Connections: Not on file  Intimate Partner Violence: Not At Risk (01/01/2023)   Humiliation, Afraid, Rape, and Kick questionnaire    Fear of Current or Ex-Partner: No    Emotionally Abused: No    Physically Abused: No    Sexually Abused: No  PHYSICAL EXAM  There were no vitals filed for this visit. There is no height or weight on file to calculate BMI.  Generalized: Well developed, in no acute distress  Cardiology: normal rate and rhythm, no murmur noted Respiratory: clear to auscultation bilaterally  Neurological examination  Mentation: Alert oriented to time, place, history taking. Follows all commands  speech and language fluent Cranial nerve II-XII: Pupils were equal round reactive to light. Extraocular movements were full, visual field were full  Motor: The motor testing reveals 5 over 5 strength of all 4 extremities. Good symmetric motor tone is noted throughout.  Gait and station: Gait is normal.    DIAGNOSTIC DATA (LABS, IMAGING, TESTING) - I reviewed patient records, labs, notes, testing and imaging myself where available.      No data to display           Lab Results  Component Value Date   WBC 7.1 07/19/2023   HGB 13.3 07/19/2023   HCT 40.8 07/19/2023   MCV 99.8 07/19/2023   PLT 201 07/19/2023      Component Value Date/Time   NA 141 07/19/2023 1235   NA 142 04/18/2021 1058   K 3.6 07/19/2023 1235   CL 103 07/19/2023 1235   CO2 28 07/19/2023 1235   GLUCOSE 105 (H) 07/19/2023 1235   BUN 19 07/19/2023 1235   BUN 15 04/18/2021 1058   CREATININE 0.81 07/19/2023 1235   CALCIUM 9.3 07/19/2023 1235   PROT 6.6 07/19/2023 1235   PROT 6.2 04/18/2021 1058   ALBUMIN 4.2 07/19/2023 1235   ALBUMIN 4.2 04/18/2021 1058   AST 34 07/19/2023 1235   ALT 32 07/19/2023 1235   ALKPHOS 58 07/19/2023 1235   BILITOT 1.2 07/19/2023 1235   BILITOT 0.6 04/18/2021 1058   GFRNONAA >60 07/19/2023 1235   GFRAA >60 08/02/2019 1004   Lab Results  Component Value Date   CHOL 127 01/02/2023   HDL 43 01/02/2023   LDLCALC 57 01/02/2023   LDLDIRECT 120 (H) 08/03/2010   TRIG 134 01/02/2023   CHOLHDL 3.0 01/02/2023   Lab Results  Component Value Date   HGBA1C 5.9 (H) 01/02/2023   Lab Results  Component Value Date   VITAMINB12 426 04/18/2021   Lab Results  Component Value Date   TSH 1.190 04/18/2021     ASSESSMENT AND PLAN 87 y.o. year old male  has a past medical history of Arthritis, CAD (coronary artery disease), Dysrhythmia, Gallstones (07/1996), Headache, History of CT scan of head (1997), History of ETT (05/2002 ), History of MRI of cervical spine (07/15/2002), Hypertension,  Lumbar spondylolysis (12/17/2016), MGUS (monoclonal gammopathy of unknown significance) (04/11/2012), Occipital neuralgia of left side (12/09/2018), Peripheral neuropathy, Pneumonia (10/2014), PONV (postoperative nausea and vomiting), Primary localized osteoarthritis of left hip (07/08/2018), Primary localized osteoarthrosis of left shoulder (12/25/2016), Primary localized osteoarthrosis of right shoulder (11/05/2017), Rectal bleeding (12/2002), Seizures (HCC), Skin cancer, Sleep apnea, Spinal stenosis, lumbar region with neurogenic claudication, Stroke (HCC), TIA (transient ischemic attack), Ulnar neuropathy at elbow (12/24/2014), and 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 extremity (03/29/2015). here with   No diagnosis found.    Jose [name]. is doing well on CPAP therapy. Compliance report reveals ***. *** was encouraged to continue using CPAP nightly and for greater than 4 hours each night. We will update supply orders as indicated. Risks of untreated sleep apnea review and education materials provided. Healthy lifestyle habits encouraged. *** will follow up in ***, sooner if needed. *** verbalizes understanding  and agreement with this plan.    No orders of the defined types were placed in this encounter.    No orders of the defined types were placed in this encounter.     Shawnie Dapper, FNP-C 07/29/2023, 3:07 PM [facility] [address], [address] [place], [place] [address] [phone]

## 2023-07-29 NOTE — Patient Instructions (Incomplete)
Please continue using your CPAP regularly. While your insurance requires that you use CPAP at least 4 hours each night on 70% of the nights, I recommend, that you not skip any nights and use it throughout the night if you can. Getting used to CPAP and staying with the treatment long term does take time and patience and discipline. Untreated obstructive sleep apnea when it is moderate to severe can have an adverse impact on cardiovascular health and raise her risk for heart disease, arrhythmias, hypertension, congestive heart failure, stroke and diabetes. Untreated obstructive sleep apnea causes sleep disruption, nonrestorative sleep, and sleep deprivation. This can have an impact on your day to day functioning and cause daytime sleepiness and impairment of cognitive function, memory loss, mood disturbance, and problems focussing. Using CPAP regularly can improve these symptoms.  We will update supply orders, today. Discuss management of donepezil and memantine with PCP. If you need me you can always reach out in the future. Also ask PCP about MGUS diagnosis in 2013. Could be contributing to neuropathy. Does he need hematology?   Follow up in 1 year

## 2023-07-31 ENCOUNTER — Encounter: Payer: Self-pay | Admitting: Family Medicine

## 2023-07-31 ENCOUNTER — Ambulatory Visit (INDEPENDENT_AMBULATORY_CARE_PROVIDER_SITE_OTHER): Payer: Medicare Other | Admitting: Family Medicine

## 2023-07-31 ENCOUNTER — Telehealth: Payer: Self-pay

## 2023-07-31 ENCOUNTER — Other Ambulatory Visit (HOSPITAL_BASED_OUTPATIENT_CLINIC_OR_DEPARTMENT_OTHER): Payer: Self-pay

## 2023-07-31 VITALS — BP 114/75 | HR 69 | Ht 73.0 in | Wt 230.0 lb

## 2023-07-31 DIAGNOSIS — G6289 Other specified polyneuropathies: Secondary | ICD-10-CM

## 2023-07-31 DIAGNOSIS — R2681 Unsteadiness on feet: Secondary | ICD-10-CM

## 2023-07-31 DIAGNOSIS — R413 Other amnesia: Secondary | ICD-10-CM | POA: Diagnosis not present

## 2023-07-31 DIAGNOSIS — G4733 Obstructive sleep apnea (adult) (pediatric): Secondary | ICD-10-CM

## 2023-07-31 MED ORDER — LINZESS 145 MCG PO CAPS
145.0000 ug | ORAL_CAPSULE | Freq: Every day | ORAL | 2 refills | Status: DC
  Filled 2023-07-31 – 2023-08-01 (×2): qty 30, 30d supply, fill #0

## 2023-07-31 NOTE — Telephone Encounter (Signed)
Called pt's wife to ask what DME pt uses for his CPAP Supplies and she stated that pt uses [facility]. Changing in pt's chart.

## 2023-08-01 ENCOUNTER — Other Ambulatory Visit (HOSPITAL_BASED_OUTPATIENT_CLINIC_OR_DEPARTMENT_OTHER): Payer: Self-pay

## 2023-10-12 ENCOUNTER — Encounter (HOSPITAL_COMMUNITY): Payer: Self-pay

## 2023-10-12 NOTE — ED Provider Notes (Incomplete)
[place] EMERGENCY DEPARTMENT AT [facility] Provider Note   CSN: [id number] Arrival date & time: 10/12/23  0225     History {Add pertinent medical, surgical, social history, OB history to HPI:1} Chief Complaint  Patient presents with   Chest Pain    Jose [name]. is a 87 y.o. male.  87 yo M with a chief complaints of chest pain.  This started just prior to arrival.  He says it is very uncomfortable about his chest that made him sweaty and short of breath.  He was given something with EMS and is not sure what it was but it made him feel quite a bit better.  He did not exercise today but exercises regularly and denies any pain previously with exercise.  He denies trauma to the chest denies cough congestion or fever.   Chest Pain      Home Medications Prior to Admission medications   Medication Sig Start Date End Date Taking? Authorizing Provider  acetaminophen (TYLENOL) 500 MG tablet Take 500 mg by mouth 3 (three) times daily.    [provider]  Aflibercept (EYLEA IZ) 1 Dose by Intravitreal route See admin instructions. 1 injection into each eye every 8-10 weeks.    [provider]  amLODipine (NORVASC) 5 MG tablet Take 5 mg by mouth daily. 06/08/19   [provider]  apixaban (ELIQUIS) 5 MG TABS tablet TAKE 1 TABLET TWICE A DAY 12/20/21   Kathleene Hazel, MD  ASCORBIC ACID PO Take 1 tablet by mouth daily. Vitamin C, unknown strength.    [provider]  aspirin EC 81 MG tablet Take 81 mg by mouth daily.    [provider]  atorvastatin (LIPITOR) 80 MG tablet Take 40 mg by mouth daily with supper.    [provider]  azelastine (ASTELIN) 0.1 % nasal spray Place 1 spray into both nostrils daily. Use in each nostril as directed    [provider]  bisacodyl (DULCOLAX) 10 MG suppository Place 1 suppository (10 mg total) rectally as needed for moderate constipation. 07/19/23   Linwood Dibbles, MD   Bromfenac Sodium (PROLENSA) 0.07 % SOLN Administer 1 drop into left eye three times daily. 12/14/22     Bromfenac Sodium (PROLENSA) 0.07 % SOLN Place 1 drop into the left eye 3 (three) times daily for 3 days then stop 03/27/23     Clobetasol Propionate 0.05 % shampoo Apply 1 application  topically See admin instructions. Apply topically to scalp 2-3 times a week as needed for severe dry skin.    [provider]  COCONUT OIL PO Take 1 capsule by mouth daily with lunch.    [provider]  docusate sodium (COLACE) 100 MG capsule Take 1 capsule (100 mg total) by mouth every 12 (twelve) hours. 07/19/23   Linwood Dibbles, MD  donepezil (ARICEPT) 10 MG tablet Take 1 tablet (10 mg total) by mouth at bedtime. 06/21/22     erythromycin ophthalmic ointment Place a small amount into the left eye 3 (three) times daily. 04/25/23     fluticasone (FLONASE) 50 MCG/ACT nasal spray Place 2 sprays into both nostrils daily. 08/29/22     isosorbide mononitrate (IMDUR) 30 MG 24 hr tablet Take 30 mg by mouth daily.    [provider]  labetalol (NORMODYNE) 100 MG tablet Take 0.5 tablets (50 mg total) by mouth 2 (two) times daily. 01/11/23   Kathleene Hazel, MD  linaclotide Karlene Einstein) 145 MCG CAPS capsule  Take 1 capsule (145 mcg total) by mouth daily before breakfast. 07/31/23     meclizine (ANTIVERT) 25 MG tablet Take 1 tablet (25 mg total) by mouth every 6 (six) hours as needed for dizziness. 05/08/23     memantine (NAMENDA) 5 MG tablet Take 1 tablet by mouth daily X 3 weeks, then take 1 tablet twice a day thereafter 07/23/22     Multiple Vitamins-Minerals (PRESERVISION AREDS 2) CAPS Take 1 capsule by mouth 2 (two) times daily.     [provider]  nitroGLYCERIN (NITROSTAT) 0.4 MG SL tablet PLACE 1 TABLET (0.4 MG TOTAL) UNDER THE TONGUE EVERY 5 (FIVE) MINUTES X 3 DOSES AS NEEDED FOR CHEST PAIN. 03/03/21   Kathleene Hazel, MD  Polyethyl Glycol-Propyl Glycol (SYSTANE OP) Place 1 drop into  both eyes 4 (four) times daily as needed (dry eyes, irritated eyes.).    [provider]  ROCKLATAN 0.02-0.005 % SOLN Place 1 drop into the right eye at bedtime. 04/10/19   [provider]  tolterodine (DETROL LA) 2 MG 24 hr capsule Take 2 mg by mouth daily.    [provider]      Allergies    Maprotiline and Sumycin [tetracycline]    Review of Systems   Review of Systems  Cardiovascular:  Positive for chest pain.    Physical Exam Updated Vital Signs BP (!) 146/66   Pulse (!) 37   Resp 15   Ht 6\' 1"  (1.854 m)   Wt 104.3 kg   SpO2 96%   BMI 30.34 kg/m  Physical Exam Vitals and nursing note reviewed.  Constitutional:      Appearance: He is well-developed.  HENT:     Head: Normocephalic and atraumatic.  Eyes:     Pupils: Pupils are equal, round, and reactive to light.  Neck:     Vascular: No JVD.  Cardiovascular:     Rate and Rhythm: Normal rate and regular rhythm.     Heart sounds: No murmur heard.    No friction rub. No gallop.  Pulmonary:     Effort: No respiratory distress.     Breath sounds: No wheezing.  Chest:     Chest wall: No tenderness.  Abdominal:     General: There is no distension.     Tenderness: There is no abdominal tenderness. There is no guarding or rebound.  Musculoskeletal:        General: Normal range of motion.     Cervical back: Normal range of motion and neck supple.  Skin:    Coloration: Skin is not pale.     Findings: No rash.  Neurological:     Mental Status: He is alert and oriented to person, place, and time.  Psychiatric:        Behavior: Behavior normal.     ED Results / Procedures / Treatments   Labs (all labs ordered are listed, but only abnormal results are displayed) Labs Reviewed  CBC WITH DIFFERENTIAL/PLATELET  COMPREHENSIVE METABOLIC PANEL  LIPASE, BLOOD  TROPONIN I (HIGH SENSITIVITY)    EKG EKG Interpretation Date/Time:  Saturday October 12 2023 02:31:11 EST Ventricular Rate:   52 PR Interval:  220 QRS Duration:  104 QT Interval:  477 QTC Calculation: 444 R Axis:   -13  Text Interpretation: Sinus bradycardia Prolonged PR interval Minimal ST elevation, inferior leads No significant change since last tracing Confirmed by Melene Plan [phone]) on 10/12/2023 2:34:23 AM  Radiology No results found.  Procedures Procedures  {Document  cardiac monitor, telemetry assessment procedure when appropriate:1}  Medications Ordered in ED Medications  alum & mag hydroxide-simeth (MAALOX/MYLANTA) 200-200-20 MG/5ML suspension 30 mL (has no administration in time range)    ED Course/ Medical Decision Making/ A&P   {   Click here for ABCD2, HEART and other calculatorsREFRESH Note before signing :1}                              Medical Decision Making Amount and/or Complexity of Data Reviewed Labs: ordered. Radiology: ordered.  Risk OTC drugs.   87 yo M with a chief complaint of chest pain.  This woke him up from sleep.  Felt a pressure across his chest pain and diaphoretic and short of breath.  Had improvement it sounds like with nitroglycerin.  Will obtain a laboratory evaluation.  This was wrong patient for encounter.  Supposed to change to MRN [id number]  {Document critical care time when appropriate:1} {Document review of labs and clinical decision tools ie heart score, Chads2Vasc2 etc:1}  {Document your independent review of radiology images, and any outside records:1} {Document your discussion with family members, caretakers, and with consultants:1} {Document social determinants of health affecting pt's care:1} {Document your decision making why or why not admission, treatments were needed:1} Final Clinical Impression(s) / ED Diagnoses Final diagnoses:  None    Rx / DC Orders ED Discharge Orders     None

## 2023-11-07 ENCOUNTER — Encounter: Payer: Self-pay | Admitting: Neurology

## 2023-11-07 ENCOUNTER — Ambulatory Visit (INDEPENDENT_AMBULATORY_CARE_PROVIDER_SITE_OTHER): Payer: Medicare Other | Admitting: Neurology

## 2023-11-07 VITALS — BP 112/68 | HR 67 | Ht 73.0 in | Wt 228.0 lb

## 2023-11-07 DIAGNOSIS — G6289 Other specified polyneuropathies: Secondary | ICD-10-CM

## 2023-11-07 DIAGNOSIS — R413 Other amnesia: Secondary | ICD-10-CM | POA: Diagnosis not present

## 2023-11-07 DIAGNOSIS — R2689 Other abnormalities of gait and mobility: Secondary | ICD-10-CM

## 2023-11-07 DIAGNOSIS — G301 Alzheimer's disease with late onset: Secondary | ICD-10-CM

## 2023-11-07 DIAGNOSIS — F0280 Dementia in other diseases classified elsewhere without behavioral disturbance: Secondary | ICD-10-CM

## 2023-11-07 DIAGNOSIS — R2681 Unsteadiness on feet: Secondary | ICD-10-CM

## 2023-11-07 MED ORDER — MEMANTINE HCL 10 MG PO TABS: 10.0000 mg | ORAL_TABLET | Freq: Two times a day (BID) | ORAL | 3 refills | Status: DC

## 2023-11-07 MED ORDER — DONEPEZIL HCL 10 MG PO TABS: 10.0000 mg | ORAL_TABLET | Freq: Every day | ORAL | 3 refills | Status: DC

## 2023-11-07 NOTE — Progress Notes (Signed)
 Subjective:    Patient ID: Jose [name]. is a 88 y.o. male.  HPI    True Mar, MD, PhD [facility] [address] [address] [place], [place] [address]  Mr. [name] is an 88 year old male with an underlying medical history of A. fib, overweight state, reflux disease, recurrent TIA, hypertension, MGUS, ulnar neuropathy, memory loss (previously followed by Dr. Scot) and arthritis, who presents for a new problem visit of memory loss.  The patient is accompanied by wife today and referred by his primary care provider.  He was last seen in this clinic by Greig Forbes, NP for his obstructive sleep apnea, for which he has been on PAP therapy with full compliance.  Today, 11/07/2023: He reports ongoing discomfort in the left hip, he had a total hip replacement on the left side.  His right hip does not bother him.  He had injections into the right knee and wears a brace, he has significant right knee pain at times.  He reports having numbness in his feet.  He feels stable, he has not fallen recently, has a walker but does not use it and feels unsteady on his feet when he first stands up, balance is not as good as it used to be.  His wife supplements his history.  Patient does have a tendency today to repeat himself and repeat the same questions.  Per wife, he is currently on memantine  5 mg twice daily and donepezil  10 mg daily.  She is requesting refills.  He has been able to tolerate the medications and they would like to follow-up at least once a year here.  Previously, he has been followed for dementia by Dr. Scot past few years and they requested a transfer of care.  He had a brain MRI without contrast through [facility] on 03/24/2022 and I reviewed the results:  IMPRESSION:  Mildly motion degraded exam.   No evidence of acute intracranial abnormality.   Mild chronic small vessel image changes within the cerebral white  matter, slightly progressed from the  prior MRI of 02/22/2016.   Small chronic lacunar infarct within the right basal ganglia, new  from the prior MRI.   Mild-to-moderate for age generalized cerebral atrophy.   He had a head CT without contrast through [facility] health emergency room with indication of headache, I have reviewed the results:   IMPRESSION: 1. No acute intracranial abnormality or significant interval change. 2. Stable atrophy and white matter disease. This likely reflects the sequela of chronic microvascular ischemia.   In addition, I personally and reviewed images through the system.  I reviewed Dr. Theodosia clinic note from 11/23/2022, at which time he was on Namenda  and donepezil .  He was felt to be stable, had some confusion, forgetfulness, and was able to do his ADLs, he was not driving.  He had an EMG and nerve conduction velocity test through our office, under Dr. 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 EMG evaluation of the left lower extremity shows distal chronic and acute denervation consistent with the diagnosis of peripheral neuropathy, but there also appears to be changes consistent with an L5 and possible S1 acute and chronic radiculopathy as well.       Previously (copied from previous notes for reference):    07/31/2023 (Amy Lomax, NP): <<[place] S Claudene Mickey.  is a 88 y.o. male here today for follow up for OSA on CPAP.  He continues to do well on therapy. He is using his machine nightly for about 7-8 hours, on average. He denies concerns with machine or supplies. He admits that he has not changed masks regularly but his wife has recently started to help him with this. He is followed by Dr Scot, neurology with [facility], for AD. He continues memantine  and donepezil . Last follow up 04/2023. Follow up scheduled 10/2023. He continues to have numbness  of bilateral feet progressing up to knees. Failed gabapentin . No falls. Hx of MGUS but unclear if he is followed for this. >>   12/03/2022: He reports ongoing issues with numbness, he has occasional pain in his legs, numbness affects both distal lower extremities from the knees on down.  He has ongoing issues with knee pain both sides and wears knee braces.  He follows with Dr. Josefina.  He had workup for neuropathy years ago with Dr. Jenel who since then retired.  His wife recalls that he was tried on gabapentin  but he no longer takes it.  He has not fallen recently, he has a cane, a walker, and wheelchair available.  He recently saw Dr. Scot in follow-up for his memory loss in January 2024.  He feels stable with regards to his memory loss and tolerates his medications, he is established on 2 medications. He has a follow-up pending in July.  He is doing well with his CPAP and reports full compliance and ongoing good results.  He does not hydrate well with water , wife estimates that he drinks less than a bottle per day.  He drinks a small amount of orange juice in the morning, about 3 cups of coffee per day.  I saw him on 07/30/2022, at which time he was compliant with his CPAP.  He was followed by orthopedics for his knee pain, he reported that his orthopedic specialist told him that a knee replacement was not possible for him   I saw him on 01/11/2021, at which time he was compliant with his CPAP machine.  He was tolerating treatment.   I reviewed his CPAP compliance data from 06/26/2022 through 07/25/2022, which is a total of 30 days, during which time he used his machine every night with percent use days greater than 4 hours at 100%, indicating superb compliance with an average usage of 7 hours and 33 minutes, residual AHI at goal at 0.5/h, leak on the higher side with the 95th percentile at 22.4 L/min on a pressure of 12 cm with EPR of 3.     I reviewed his CPAP compliance data from 12/11/2020 through  01/09/2021, which is a total of 30 days, during which time he used his machine every night with percent use days greater than 4 hours at 100%, indicating superb compliance, average usage of 7 hours and 24 minutes, residual AHI at goal at 0.2/h, leak acceptable with a 95th percentile at 16.5 L/min on a pressure of 12 cm but EPR of 3.        I saw him on 12/21/2019, At which time he was compliant with his CPAP and doing well.   He saw Dr. Jenel in the interim for his neuropathy on 01/14/2020.   He saw Lauraine Born, NP, on 07/19/2020, at which time he was eligible for new machine.  A new prescription for a machine was written in November 2021.   He received an AutoPap machine with default  settings of 4 to 20 cm.  He called in January 2022 reporting that the pressure was too high.  His pressure was adjusted to a set pressure of 12 cm as before.   I saw him on 12/05/2019, at which time he was compliant with his CPAP and doing well.     I reviewed his CPAP compliance data from 11/16/2019 through 12/15/2019 which is a total of 30 days, during which time he used his machine every night with percent use days greater than 4 hours at 100%, indicating superb compliance with an average usage of 7 hours and 40 minutes, residual AHI at goal at 0.2/h, leak on the higher side with a 95th percentile at 21.8 L/min on a pressure of 12 cm with EPR of 3.      I saw him on 12/04/2017, at which time he was fully compliant with his CPAP. Of note, he had interim left shoulder replacement surgery on 12/25/2016, then had lower back surgery in September 2018, then had right shoulder replacement in January 2019.   I reviewed his CPAP compliance data from 11/03/2018 through 12/02/2018 which is a total of 30 days, during which time he used his CPAP every night with percent used days greater than 4 hours at 100%, indicating superb compliance with an average usage of 7 hours and 26 minutes, residual AHI at goal at 0.3 per hour, leak on the  high side with the 95th percentile at 29.8 L/m on a pressure of 12 cm with EPR of 3.      I saw him on 12/04/2016, at which time he reported doing well with CPAP. He was supposed to have left total shoulder replacement in February 2018.   I reviewed his CPAP compliance data from 11/03/2017 through 12/02/2017 which is a total of 30 days, during which time he used his CPAP every night with percent used days greater than 4 hours at 100%, indicating superb compliance with an average usage of 7 hours and 28 minutes, residual AHI at goal at 0.3 per hour, leak on the higher end with the 95th percentile at 17.8 L/m on a pressure of 12 cm with EPR of 3.  I saw him on 12/05/2015, at which time we talked about his split-night sleep study results from 03/01/2015 and his compliance with CPAP. He was fully compliant with CPAP therapy. I suggested with a checkup. He saw Dr. Jenel in the interim in March 2017. He had been treated in January 2017 for a community-acquired pneumonia. He then presented to the emergency room in January 2017 a balance issues and was diagnosed with vertigo, placed on meclizine  as needed.    I reviewed his CPAP compliance data from 11/03/2016 through 12/02/2016, which is a total of 30 days, during which time he used his CPAP every night with percent used days greater than 4 hours at 100%, indicating superb compliance with an average usage of 7 hours and 33 minutes, residual AHI 0.3 per hour, leak low with the 95th percentile at 10.2 L/m on a pressure of 12 cm with EPR of 3.    I first met him on 06/01/2015 at the request of Dr. Jenel, at which time we talked about his split-night sleep study results from 03/01/2015 as well as his compliance data. He reported doing well. He had adjusted well to treatment. He felt improved with respect to sleep disruption, daytime somnolence, and sleep quality. He had recent ulnar nerve surgery about 3 weeks prior. He was taking  gabapentin  for neuropathy. He was  seeing a cardiologist out of [place] and was on Eliquis . He goes to bed around 11 PM and his rise time is around 7 AM. He wakes up better rested. His neuropathy seems stable to him. In the interim, he had neck surgery on 02/04/2015 under Dr. Carles. He feels improved. He has a follow-up with his hand surgeon next week. He has not had any recent TIA type symptoms. He drinks alcohol  occasionally. He quit smoking in the late 60s. He drinks quite a bit of coffee, at least 2 12 ounce cups a day.   I reviewed his CPAP compliance data from 11/01/2015 through 11/30/2015 which is a total of 30 days during which time he used his machine every night with percent used days greater than 4 hours at 100%, indicating superb compliance with an average usage of 7 hours and 29 minutes, residual AHI low at 0.4 per hour, leak borderline with the 95th percentile at 24.3 L/m on a pressure of 12 cm with EPR of 3.   06/01/2015: He was referred for a sleep study secondary to a report of witnessed apneas during his hospitalization in 1/16, and snoring reported. He had a split-night sleep study on 03/01/2015 and went over his test results with him in detail today. His baseline sleep efficiency was reduced at 77% with a latency to sleep of 5.5 minutes and wake after sleep onset of 29.5 minutes with moderate sleep fragmentation noted. He had a markedly elevated arousal index at 73.3 per hour, secondary to respiratory events. He had absence of slow-wave sleep and REM sleep prior to CPAP initiation. He had no significant PLMS, EKG or EEG changes. He had moderate snoring. He had a total AHI highly elevated at 71.3 per hour, average oxygen saturation was 93%, nadir was 83% during non-REM sleep. He was then placed on CPAP therapy. Sleep efficiency was 87.5% with a latency to sleep of 4 minutes and wake after sleep onset of 27 minutes with mild to moderate sleep fragmentation noted. He had a tremendous improvement in his arousal index to 3.6  per hour. He had 12.2% of slow-wave sleep and achieved 28.9% of REM sleep. Average oxygen saturation was 93%, nadir was 81%. He had no significant PLMS during the treatment portion of the study. CPAP was titrated from 5 cm to 13 cm with a reduction of his AHI to 2.8 per hour at a pressure of 12 cm. Based on his test results I prescribed CPAP therapy for home use.    I reviewed his CPAP compliance data from 05/01/2015 through 05/30/2015 which is a total of 30 days during which time he used his machine every night with percent used days greater than 4 hours at 93%, indicating excellent compliance with an average usage of 6 hours and 32 minutes, residual AHI at 3.4 per hour, leak acceptable with the 95th percentile at 20.2 L/m on a pressure of 12 cm with EPR of 3.        His Past Medical History Is Significant For: Past Medical History:  Diagnosis Date   Arthritis    CAD (coronary artery disease)    04/15/18 65% dLAD, 20% pLAD, 30% 2nd diag, Normal EF   Dysrhythmia    afib   Gallstones 07/1996   Headache    couple times a year   History of CT scan of head 1997   small vessel disease   History of ETT 05/2002    no EKG changes  History of MRI of cervical spine 07/15/2002   Hypertension    Lumbar spondylolysis 12/17/2016   MGUS (monoclonal gammopathy of unknown significance) 04/11/2012   Occipital neuralgia of left side 12/09/2018   Peripheral neuropathy    Pneumonia 10/2014   PONV (postoperative nausea and vomiting)    once many years ago   Primary localized osteoarthritis of left hip 07/08/2018   Primary localized osteoarthrosis of left shoulder 12/25/2016   Primary localized osteoarthrosis of right shoulder 11/05/2017   Rectal bleeding 12/2002   colonoscopy   Seizures (HCC)    last seizure 1976   Skin cancer    Sleep apnea    uses CPAP   Spinal stenosis, lumbar region with neurogenic claudication    Stroke [facility])    ?tia ?afib   TIA (transient ischemic attack)    Ulnar neuropathy at  elbow 12/24/2014   Left   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 extremity 03/29/2015    Her Past Surgical History Is Significant For: Past Surgical History:  Procedure Laterality Date   ANTERIOR CERVICAL DECOMP/DISCECTOMY FUSION N/A 02/04/2015   Procedure: ANTERIOR CERVICAL DECOMPRESSION/DISCECTOMY FUSION CERVICAL FIVE-SIX,CERVICAL SIX-SEVEN;  Surgeon: Darina Boehringer, MD;  Location: [facility];  Service: Neurosurgery;  Laterality: N/A;   arthroscopic knee surgery     BACK SURGERY     CATARACT EXTRACTION Bilateral    COLONOSCOPY W/ POLYPECTOMY  04/19/2003   tubular adenoma   EYE SURGERY     Bilateral Cataracts   GREAT TOE ARTHRODESIS, INTERPHALANGEAL JOINT Left 1978   JOINT REPLACEMENT     joint replacement on right thumb Right    LEFT HEART CATH AND CORONARY ANGIOGRAPHY N/A 04/15/2018   Procedure: LEFT HEART CATH AND CORONARY ANGIOGRAPHY;  Surgeon: Burnard Debby LABOR, MD;  Location: [facility] INVAS[facility] CV [facility];  Service: Cardiovascular;  Laterality: N/A;   LUMBAR LAMINECTOMY/DECOMPRESSION MICRODISCECTOMY N/A 07/19/2017   Procedure: LAMINECTOMY AND FORAMINOTOMY  LUMBAR TWO- LUMBAR THREE, LUMBAR THREE- LUMBAR FOUR;  Surgeon: Gillie Duncans, MD;  Location: [facility] OR;  Service: Neurosurgery;  Laterality: N/A;   nerve transfer surgery to lwft hand Left    Nov. 2017   SHOULDER ARTHROSCOPY W/ ROTATOR CUFF REPAIR Left 2013   rotator cuff    SHOULDER INJECTION  03/2011   left, [facility]   SKIN CANCER EXCISION  07/1993   left lateral arm   SUPERFICIAL KERATECTOMY Right 05/1998   wrist   thumb injection  03/2011   left, [facility]   TOE SURGERY Right    TONSILLECTOMY AND ADENOIDECTOMY     TOTAL HIP ARTHROPLASTY Left 07/08/2018   TOTAL HIP ARTHROPLASTY Left 07/08/2018   Procedure: LEFT TOTAL HIP ARTHROPLASTY;  Surgeon: Josefina Chew, MD;  Location: [facility] OR;  Service: Orthopedics;  Laterality: Left;   TOTAL SHOULDER ARTHROPLASTY Left 12/25/2016   Procedure: TOTAL SHOULDER ARTHROPLASTY;  Surgeon: Chew Josefina,  MD;  Location: [facility] OR;  Service: Orthopedics;  Laterality: Left;   TOTAL SHOULDER ARTHROPLASTY Right 11/05/2017   TOTAL SHOULDER ARTHROPLASTY Right 11/05/2017   Procedure: TOTAL SHOULDER ARTHROPLASTY;  Surgeon: Josefina Chew, MD;  Location: [facility] OR;  Service: Orthopedics;  Laterality: Right;   ulnar nerve transfer  12/1999   left   ULNAR NERVE TRANSPOSITION Left     His Family History Is Significant For: Family History  Problem Relation Age of Onset   Dementia Mother 5       vascular   Transient ischemic attack Mother    Dementia Father        after  hip fracture   Hypertension Sister        skin cancer   Seizures Neg Hx    Sleep apnea Neg Hx    Neuropathy Neg Hx     His Social History Is Significant For: Social History   Socioeconomic History   Marital status: Married    Spouse name: Almarie Lovely Rochers   Number of children: 2   Years of education: 19   Highest education level: Not on file  Occupational History   Occupation: retired    Associate Professor: UNEMPLOYED  Tobacco Use   Smoking status: Former    Current packs/day: 0.00    Average packs/day: 2.0 packs/day for 13.0 years (26.0 ttl pk-yrs)    Types: Cigarettes    Start date: 03/06/1955    Quit date: 03/05/1968    Years since quitting: 55.7   Smokeless tobacco: Never  Vaping Use   Vaping status: Never Used  Substance and Sexual Activity   Alcohol  use: Yes    Alcohol /week: 0.0 - 2.0 standard drinks of alcohol     Comment: social   Drug use: No   Sexual activity: Not Currently  Other Topics Concern   Not on file  Social History Narrative   Lives with 4th wife, Almarie Lovely Rochers, married 2010   Retired, Actuary   Son, [place], [place], 2 children   Daughter, [place], in [place], 2 children   Patient is right handed   Patient drinks approximately 2 cups of caffeine daily   Social Drivers of Corporate Investment Banker Strain: Not on file  Food Insecurity: No Food Insecurity  (01/01/2023)   Hunger Vital Sign    Worried About Running Out of Food in the Last Year: Never true    Ran Out of Food in the Last Year: Never true  Transportation Needs: No Transportation Needs (01/01/2023)   PRAPARE - Administrator, Civil Service (Medical): No    Lack of Transportation (Non-Medical): No  Physical Activity: Not on file  Stress: Not on file  Social Connections: Not on file    His Allergies Are:  Allergies  Allergen Reactions   Maprotiline Other (See Comments)    Tetracyclic antidepressants Pt unaware of this allergy   Sumycin [Tetracycline] Itching and Rash  :   His Current Medications Are:  Outpatient Encounter Medications as of 11/07/2023  Medication Sig   acetaminophen  (TYLENOL ) 500 MG tablet Take 500 mg by mouth 3 (three) times daily.   Aflibercept (EYLEA IZ) 1 Dose by Intravitreal route See admin instructions. 1 injection into each eye every 8-10 weeks.   amLODipine  (NORVASC ) 5 MG tablet Take 5 mg by mouth daily.   apixaban  (ELIQUIS ) 5 MG TABS tablet TAKE 1 TABLET TWICE A DAY   ASCORBIC ACID PO Take 1 tablet by mouth daily. Vitamin C, unknown strength.   aspirin  EC 81 MG tablet Take 81 mg by mouth daily.   atorvastatin  (LIPITOR ) 80 MG tablet Take 40 mg by mouth daily with supper.   azelastine (ASTELIN) 0.1 % nasal spray Place 1 spray into both nostrils daily. Use in each nostril as directed   Bromfenac  Sodium (PROLENSA ) 0.07 % SOLN Administer 1 drop into left eye three times daily.   Bromfenac  Sodium (PROLENSA ) 0.07 % SOLN Place 1 drop into the left eye 3 (three) times daily for 3 days then stop   Clobetasol Propionate 0.05 % shampoo Apply 1 application  topically See admin instructions. Apply topically to scalp  2-3 times a week as needed for severe dry skin.   COCONUT OIL PO Take 1 capsule by mouth daily with lunch.   erythromycin  ophthalmic ointment Place a small amount into the left eye 3 (three) times daily.   fluticasone  (FLONASE ) 50 MCG/ACT nasal  spray Place 2 sprays into both nostrils daily.   isosorbide  mononitrate (IMDUR ) 30 MG 24 hr tablet Take 30 mg by mouth daily.   labetalol  (NORMODYNE ) 100 MG tablet Take 0.5 tablets (50 mg total) by mouth 2 (two) times daily.   meclizine  (ANTIVERT ) 25 MG tablet Take 1 tablet (25 mg total) by mouth every 6 (six) hours as needed for dizziness.   Multiple Vitamins-Minerals (PRESERVISION AREDS 2) CAPS Take 1 capsule by mouth 2 (two) times daily.    nitroGLYCERIN  (NITROSTAT ) 0.4 MG SL tablet PLACE 1 TABLET (0.4 MG TOTAL) UNDER THE TONGUE EVERY 5 (FIVE) MINUTES X 3 DOSES AS NEEDED FOR CHEST PAIN.   Polyethyl Glycol-Propyl Glycol (SYSTANE OP) Place 1 drop into both eyes 4 (four) times daily as needed (dry eyes, irritated eyes.).   ROCKLATAN  0.02-0.005 % SOLN Place 1 drop into the right eye at bedtime.   tolterodine (DETROL LA) 2 MG 24 hr capsule Take 2 mg by mouth daily.   [DISCONTINUED] donepezil  (ARICEPT ) 10 MG tablet Take 1 tablet (10 mg total) by mouth at bedtime.   [DISCONTINUED] memantine  (NAMENDA ) 5 MG tablet Take 1 tablet by mouth daily X 3 weeks, then take 1 tablet twice a day thereafter   bisacodyl  (DULCOLAX) 10 MG suppository Place 1 suppository (10 mg total) rectally as needed for moderate constipation. (Patient not taking: Reported on 11/07/2023)   docusate sodium  (COLACE) 100 MG capsule Take 1 capsule (100 mg total) by mouth every 12 (twelve) hours. (Patient not taking: Reported on 11/07/2023)   donepezil  (ARICEPT ) 10 MG tablet Take 1 tablet (10 mg total) by mouth at bedtime.   linaclotide  (LINZESS ) 145 MCG CAPS capsule Take 1 capsule (145 mcg total) by mouth daily before breakfast. (Patient not taking: Reported on 11/07/2023)   memantine  (NAMENDA ) 10 MG tablet Take 1 tablet (10 mg total) by mouth 2 (two) times daily.   No facility-administered encounter medications on file as of 11/07/2023.  :   Review of Systems:  Out of a complete 14 point review of systems, all are reviewed and negative with  the exception of these symptoms as listed below:  Review of Systems  Neurological:        Patient is here with his wife for transfer of care for Dementia. Wife also states this referral is for neuropathy as well. She states pt was officially diagnosed with Dementia a year ago but it started before that.     Objective:  Neurological Exam  Physical Exam Physical Examination:   Vitals:   11/07/23 0953  BP: 112/68  Pulse: 67    General Examination: The patient is a very pleasant 88 y.o. male in no acute distress. He appears well-developed and well-nourished and well groomed.   HEENT: Normocephalic, atraumatic, pupils are equal, round and reactive to light, corrective eyeglasses in place.  Extraocular tracking is well preserved. Hearing is mildly impaired, with bilateral hearing aids in place. He is status post cataract surgeries. Face is symmetric with normal facial animation and normal facial sensation. Speech is clear with no dysarthria noted. There is no hypophonia. There is no lip, neck/head, jaw or voice tremor. Neck shows FROM. Oropharynx exam reveals: mild mouth dryness, adequate dental hygiene and moderate  airway crowding. Tongue protrudes centrally and palate elevates symmetrically. Tonsils are absent.    Chest: Clear to auscultation without wheezing, rhonchi or crackles noted.   Heart: S1+S2+0, regular and normal without murmurs, rubs or gallops noted. Mild bradycardia.     Abdomen: Soft, non-tender and non-distended.   Extremities: There is puffiness around both ankles.       Skin: Warm and dry without trophic changes noted.   Musculoskeletal: exam reveals R knee brace in place.  Limited range of motion both shoulders, limited range of motion right knee, limited range of motion left hip.   Neurologically:  Mental status: The patient is awake, alert and able to provide some of his history, history is heavily supplemented by his wife.  He has a tendency to repeat himself and  asked the same questions during this visit.  Speech is clear with normal prosody and enunciation. Thought process is linear. Mood is normal and affect is normal.  Cranial nerves II - XII are as described above under HEENT exam.  Motor exam: Normal bulk, strength about 4 out of 5 and normal tone is noted. There is no obvious tremor. Romberg is not tested due to safety concerns.  Fine motor skills and coordination: grossly intact for age and. Cerebellar testing: No dysmetria or intention tremor, no gait ataxia.   Sensory exam: intact to light touch in the upper and lower extremities.  Reflexes are 1+ in the upper extremities and absent in the lower extremities.  Of note, I did not remove his knee braces.  Gait, station and balance: He stands with mild difficulty. Stance is wide based and posture is age-appropriate.  He did not bring his walker today and walks unsteadily, slightly wider base, shorter steps, has a tendency to look down while walking.  He turns in 3 steps.       Assessment and Plan:    In summary, Jose [name]. is an 88 year old male with an underlying medical history of A. fib, overweight state, reflux disease, recurrent TIA, hypertension, MGUS, ulnar neuropathy, memory loss (previously followed by Dr. Scot) and arthritis, who presents for evaluation of his memory loss of several years duration.  He is currently on donepezil  10 mg daily and memantine  5 mg twice daily.  He has been able to tolerate medications which were previously prescribed by Dr. Scot.  I suggested we increase his memantine  to 10 mg twice daily at this point.  He has significant arthritis with status post multiple surgeries including ulnar nerve surgery, s/p joint replacement and back surgeries, and he has a longstanding history of peripheral neuropathy, for which he was evaluated by Dr. Jenel in the past.   He is currently not on any medication to help with neuropathy pain as his symptoms are primarily  numbness. He is advised that there is no medication unfortunately that would treat numbness.  He had tried gabapentin  in the past but no longer takes it.  For gait safety, he is advised to use his walker at all times, he is unstable while walking, he is wearing knee braces and is followed by orthopedics.  We talked about the importance of maintaining a healthy lifestyle and fall prevention.  He is advised to stay well-hydrated with water . He is advised to continue to use his CPAP consistently and follow-up as scheduled in sleep clinic to see Greig Forbes, NP in October 2025. I answered all their questions today and the patient and his wife were in agreement with our  plan.  I spent 60 minutes in total face-to-face time and in reviewing records during pre-charting, more than 50% of which was spent in counseling and coordination of care, reviewing test results, reviewing medications and treatment regimen and/or in discussing or reviewing the diagnosis of dementia, sleep apnea, neuropathy, the prognosis and treatment options. Pertinent laboratory and imaging test results that were available during this visit with the patient were reviewed by me and considered in my medical decision making (see chart for details).

## 2023-11-29 ENCOUNTER — Other Ambulatory Visit (HOSPITAL_BASED_OUTPATIENT_CLINIC_OR_DEPARTMENT_OTHER): Payer: Self-pay

## 2023-11-29 MED ORDER — ERYTHROMYCIN 5 MG/GM OP OINT
1.0000 | TOPICAL_OINTMENT | Freq: Every day | OPHTHALMIC | 0 refills | Status: DC
Start: 2023-11-29 — End: 2024-07-30
  Filled 2023-11-29: qty 3.5, 3d supply, fill #0

## 2023-12-19 ENCOUNTER — Telehealth: Payer: Self-pay | Admitting: Cardiovascular Disease

## 2023-12-19 NOTE — Telephone Encounter (Signed)
Wife called to see if the patient needs to hvve a cleared for his upcoming surgery on his right thumb. Please advise

## 2023-12-19 NOTE — Telephone Encounter (Signed)
Called and spoke w patient's wife.  During discussion she said that family practice physicians have cleared him for and provided instructions on holding Eliquis.   Adv her to check with the hand surgeon then, to find out if they even need cardiac clearance.  She will contact them and have them send clearance request if needed.

## 2023-12-23 ENCOUNTER — Telehealth: Payer: Self-pay | Admitting: *Deleted

## 2023-12-23 NOTE — Telephone Encounter (Signed)
   Pre-operative Risk Assessment    Patient Name: Jose [name].  DOB: [birth year] MRN: [id number]   Date of last office visit: 07/08/23 DR. McALHANY Date of next office visit: NONE   Request for Surgical Clearance    Procedure:   RIGHT THUMB [facility] ARTHROCENTESIS   Date of Surgery:  Clearance 01/08/24                                Surgeon:  DR. Kelli Hope Surgeon's Group or Practice Name:  [facility] Phone number:  [phone] Fax number:  [phone]   Type of Clearance Requested:   - Pharmacy:  Hold Aspirin and Apixaban (Eliquis) ( PER CLEARANCE FORM STATES "MEDICATION CLEARANCE FOR SURGERY")   Type of Anesthesia:  Not Indicated   Additional requests/questions:    Elpidio Anis   12/23/2023, 6:02 PM

## 2023-12-24 NOTE — Telephone Encounter (Signed)
 Pharmacy please advise on holding Eliquis prior to Right Thumb Arthrocentesis scheduled for 01/08/2024. Thank you.

## 2023-12-25 ENCOUNTER — Telehealth: Payer: Self-pay | Admitting: Cardiovascular Disease

## 2023-12-25 DIAGNOSIS — R202 Paresthesia of skin: Secondary | ICD-10-CM | POA: Insufficient documentation

## 2023-12-25 NOTE — Telephone Encounter (Signed)
 Caller (Davina) called to follow-up on the status of patient's clearance and holding patient's medication prior to surgery.  Caller stated clearance can be faxed to fax# [phone].

## 2023-12-25 NOTE — Telephone Encounter (Signed)
 I will forward this to preop APP as to review if the pt has been cleared.

## 2023-12-25 NOTE — Telephone Encounter (Signed)
 I will send notes as FYI to surgeon office, see notes from preop APP.

## 2023-12-25 NOTE — Telephone Encounter (Signed)
 Patient with diagnosis of A Fib on Eliquis for anticoagulation.    Procedure: Right Thumb Arthrocentesis  Date of procedure: 01/08/24   CHA2DS2-VASc Score = 6  This indicates a 9.7% annual risk of stroke. The patient's score is based upon: CHF History: 0 HTN History: 1 Diabetes History: 0 Stroke History: 2 Vascular Disease History: 1 Age Score: 2 Gender Score: 0   CrCl 96 ml/min Platelet count 201K  Per office protocol, patient can hold Eliquis for 2 days prior to procedure.  **This guidance is not considered finalized until pre-operative APP has relayed final recommendations.**

## 2023-12-26 NOTE — Telephone Encounter (Signed)
   Patient Name: Jose [name].  DOB: 1936/07/04 MRN: [id number]  Primary Cardiologist: Verne Carrow, MD  Clinical pharmacists have reviewed the patient's past medical history, labs, and current medications as part of preoperative protocol coverage. The following recommendations have been made:   Patient with diagnosis of A Fib on Eliquis for anticoagulation.     Procedure: Right Thumb Arthrocentesis  Date of procedure: 01/08/24     CHA2DS2-VASc Score = 6  This indicates a 9.7% annual risk of stroke. The patient's score is based upon: CHF History: 0 HTN History: 1 Diabetes History: 0 Stroke History: 2 Vascular Disease History: 1 Age Score: 2 Gender Score: 0     CrCl 96 ml/min Platelet count 201K   Per office protocol, patient can hold Eliquis for 2 days prior to procedure. Please resume Eliquis as soon as possible postprocedure, at the discretion of the surgeon.   I will route this recommendation to the requesting party via Epic fax function and remove from pre-op pool.  Please call with questions.  Joylene Grapes, NP 12/26/2023, 9:03 AM

## 2024-01-08 ENCOUNTER — Other Ambulatory Visit (HOSPITAL_BASED_OUTPATIENT_CLINIC_OR_DEPARTMENT_OTHER): Payer: Self-pay

## 2024-01-08 MED ORDER — TRAMADOL HCL 50 MG PO TABS
50.0000 mg | ORAL_TABLET | Freq: Four times a day (QID) | ORAL | 0 refills | Status: DC | PRN
  Filled 2024-01-08: qty 15, 4d supply, fill #0

## 2024-02-12 ENCOUNTER — Other Ambulatory Visit (HOSPITAL_BASED_OUTPATIENT_CLINIC_OR_DEPARTMENT_OTHER): Payer: Self-pay

## 2024-02-12 MED ORDER — METHYLPREDNISOLONE 4 MG PO TBPK
ORAL_TABLET | ORAL | 0 refills | Status: DC
Start: 2024-02-12 — End: 2024-07-30
  Filled 2024-02-12: qty 21, 6d supply, fill #0

## 2024-02-24 ENCOUNTER — Other Ambulatory Visit (HOSPITAL_BASED_OUTPATIENT_CLINIC_OR_DEPARTMENT_OTHER): Payer: Self-pay

## 2024-03-03 ENCOUNTER — Other Ambulatory Visit (HOSPITAL_BASED_OUTPATIENT_CLINIC_OR_DEPARTMENT_OTHER): Payer: Self-pay

## 2024-03-03 MED ORDER — TRIAMCINOLONE ACETONIDE 0.5 % EX CREA
1.0000 | TOPICAL_CREAM | Freq: Two times a day (BID) | CUTANEOUS | 0 refills | Status: AC
  Filled 2024-03-03: qty 450, 30d supply, fill #0
  Filled 2024-03-03: qty 15, 8d supply, fill #0

## 2024-03-04 ENCOUNTER — Other Ambulatory Visit (HOSPITAL_BASED_OUTPATIENT_CLINIC_OR_DEPARTMENT_OTHER): Payer: Self-pay

## 2024-03-05 ENCOUNTER — Other Ambulatory Visit (HOSPITAL_BASED_OUTPATIENT_CLINIC_OR_DEPARTMENT_OTHER): Payer: Self-pay

## 2024-03-05 ENCOUNTER — Other Ambulatory Visit: Payer: Self-pay

## 2024-03-05 MED ORDER — ITCH RELIEF 2 % EX CREA
TOPICAL_CREAM | CUTANEOUS | 0 refills | Status: AC
  Filled 2024-03-05: qty 30, 14d supply, fill #0

## 2024-03-05 MED ORDER — SARNA 0.5-0.5 % EX LOTN
TOPICAL_LOTION | CUTANEOUS | 0 refills | Status: DC
  Filled 2024-03-05: qty 222, 14d supply, fill #0

## 2024-03-05 MED ORDER — METHYLPREDNISOLONE 4 MG PO TBPK
ORAL_TABLET | ORAL | 0 refills | Status: DC
  Filled 2024-03-05: qty 21, 6d supply, fill #0

## 2024-03-06 ENCOUNTER — Other Ambulatory Visit (HOSPITAL_BASED_OUTPATIENT_CLINIC_OR_DEPARTMENT_OTHER): Payer: Self-pay

## 2024-03-09 ENCOUNTER — Other Ambulatory Visit (HOSPITAL_BASED_OUTPATIENT_CLINIC_OR_DEPARTMENT_OTHER): Payer: Self-pay

## 2024-03-10 ENCOUNTER — Other Ambulatory Visit (HOSPITAL_BASED_OUTPATIENT_CLINIC_OR_DEPARTMENT_OTHER): Payer: Self-pay

## 2024-03-19 ENCOUNTER — Other Ambulatory Visit (HOSPITAL_BASED_OUTPATIENT_CLINIC_OR_DEPARTMENT_OTHER): Payer: Self-pay

## 2024-03-25 ENCOUNTER — Other Ambulatory Visit (HOSPITAL_BASED_OUTPATIENT_CLINIC_OR_DEPARTMENT_OTHER): Payer: Self-pay

## 2024-03-26 ENCOUNTER — Other Ambulatory Visit (HOSPITAL_BASED_OUTPATIENT_CLINIC_OR_DEPARTMENT_OTHER): Payer: Self-pay

## 2024-03-26 MED ORDER — AMOXICILLIN 500 MG PO CAPS
2000.0000 mg | ORAL_CAPSULE | Freq: Every day | ORAL | 0 refills | Status: AC
  Filled 2024-03-26: qty 12, 3d supply, fill #0

## 2024-05-21 ENCOUNTER — Other Ambulatory Visit (HOSPITAL_BASED_OUTPATIENT_CLINIC_OR_DEPARTMENT_OTHER): Payer: Self-pay

## 2024-05-21 MED ORDER — ATORVASTATIN CALCIUM 40 MG PO TABS
40.0000 mg | ORAL_TABLET | Freq: Every day | ORAL | 3 refills | Status: AC
  Filled 2024-05-21 (×2): qty 90, 90d supply, fill #0
  Filled 2024-10-31: qty 90, 90d supply, fill #1

## 2024-06-17 ENCOUNTER — Other Ambulatory Visit (HOSPITAL_BASED_OUTPATIENT_CLINIC_OR_DEPARTMENT_OTHER): Payer: Self-pay

## 2024-06-17 ENCOUNTER — Telehealth: Payer: Self-pay | Admitting: Cardiovascular Disease

## 2024-06-17 MED ORDER — APIXABAN 5 MG PO TABS
5.0000 mg | ORAL_TABLET | Freq: Two times a day (BID) | ORAL | 0 refills | Status: DC
  Filled 2024-06-17: qty 10, 5d supply, fill #0

## 2024-06-17 NOTE — Telephone Encounter (Signed)
 Prescription refill request for Eliquis  received. Indication: PAF Last office visit: 07/08/23  Jose [name] Cash MD Scr: 0.94 on 05/21/24  Epic Age: 88 Weight: 104.1kg  Based on above findings Eliquis  5mg  twice daily is the appropriate dose.  Refill approved for 5 day emergency supply as requested.

## 2024-06-17 NOTE — Telephone Encounter (Signed)
 *  STAT* If patient is at the pharmacy, call can be transferred to refill team.  1. Which medications need to be refilled? (please list name of each medication and dose if known)   apixaban  (ELIQUIS ) 5 MG TABS tablet   2. Which pharmacy/location (including street and city if local pharmacy) is medication to be sent to?  [facility] [facility]   3. Do they need a 30 day or 90 day supply? 5 day emergency supply since patient is completely out and they are waiting for the mail order to show up

## 2024-06-19 ENCOUNTER — Telehealth: Payer: Self-pay | Admitting: Cardiovascular Disease

## 2024-06-19 ENCOUNTER — Other Ambulatory Visit: Payer: Self-pay

## 2024-06-19 MED ORDER — APIXABAN 5 MG PO TABS: 5.0000 mg | ORAL_TABLET | Freq: Two times a day (BID) | ORAL | 5 refills | Status: AC

## 2024-06-19 NOTE — Telephone Encounter (Signed)
*  STAT* If patient is at the pharmacy, call can be transferred to refill team.   1. Which medications need to be refilled? (please list name of each medication and dose if known)   apixaban  (ELIQUIS ) 5 MG TABS tablet    2. Which pharmacy/location (including street and city if local pharmacy) is medication to be sent to?  [id number] - [place], [place] - [address]      3. Do they need a 30 day or 90 day supply? 30 day    Pt is out of medication on Monday and leaves for vacation that day

## 2024-06-19 NOTE — Telephone Encounter (Signed)
 Prescription refill request for Eliquis  received. Indication:afib Last office visit:9/24 Scr:0.94  7/25 Age: 88 Weight:103.4  kg  Prescription refilled

## 2024-07-28 NOTE — Progress Notes (Unsigned)
 PATIENT: Jose [name] Jose [name]. DOB: 01-Jul-1936  REASON FOR VISIT: follow up HISTORY FROM: patient  No chief complaint on file.    HISTORY OF PRESENT ILLNESS:  07/28/24 ALL:  Jose [name]. is a 88 y.o. male here today for follow up for OSA on CPAP.  He continues to do well on therapy. He is using his machine nightly for about 7-8 hours, on average. He denies concerns with machine or supplies.    07/31/2023 ALL:  Jose [name]. is a 88 y.o. male here today for follow up for OSA on CPAP.  He continues to do well on therapy. He is using his machine nightly for about 7-8 hours, on average. He denies concerns with machine or supplies. He admits that he has not changed masks regularly but his wife has recently started to help him with this. He is followed by Dr Scot, neurology with [facility], for AD. He continues memantine  and donepezil . Last follow up 04/2023. Follow up scheduled 10/2023. He continues to have numbness of bilateral feet progressing up to knees. Failed gabapentin . No falls. Hx of MGUS but unclear if he is followed for this.     HISTORY: (copied from Dr Obie previous note)  Jose [name] is an 88 year old male with an underlying medical history of A. fib, overweight state, reflux disease, recurrent TIA, hypertension, MGUS, ulnar neuropathy, memory loss (followed by Dr. Scot) and arthritis, who presents for follow-up consultation of his neuropathy. The patient is accompanied by his wife today. I last saw him on 07/30/2022, at which time he was compliant with his CPAP.  He was followed by orthopedics for his knee pain, he reported that his orthopedic specialist told him that a knee replacement was not possible for him   Today, 12/03/2022: He reports ongoing issues with numbness, he has occasional pain in his legs, numbness affects both distal lower extremities from the knees on down.  He has ongoing issues with knee pain both sides and wears knee braces.  He follows with Dr.  Josefina.  He had workup for neuropathy years ago with Dr. Jenel who since then retired.  His wife recalls that he was tried on gabapentin  but he no longer takes it.  He has not fallen recently, he has a cane, a walker, and wheelchair available.  He recently saw Dr. Scot in follow-up for his memory loss in January 2024.  He feels stable with regards to his memory loss and tolerates his medications, he is established on 2 medications. He has a follow-up pending in July.  He is doing well with his CPAP and reports full compliance and ongoing good results.  He does not hydrate well with water , wife estimates that he drinks less than a bottle per day.  He drinks a small amount of orange juice in the morning, about 3 cups of coffee per day.  REVIEW OF SYSTEMS: Out of a complete 14 system review of symptoms, the patient complains only of the following symptoms, memory loss, neuropathy, and all other reviewed systems are negative.  ESS: 0/24  ALLERGIES: Allergies  Allergen Reactions   Maprotiline Other (See Comments)    Tetracyclic antidepressants Pt unaware of this allergy   Sumycin [Tetracycline] Itching and Rash    HOME MEDICATIONS: Outpatient Medications Prior to Visit  Medication Sig Dispense Refill   acetaminophen  (TYLENOL ) 500 MG tablet Take 500 mg by mouth 3 (three) times daily.     Aflibercept (EYLEA IZ) 1 Dose by Intravitreal  route See admin instructions. 1 injection into each eye every 8-10 weeks.     amLODipine  (NORVASC ) 5 MG tablet Take 5 mg by mouth daily.     amoxicillin  (AMOXIL ) 500 MG capsule Take 4 capsules (2,000 mg total) by mouth 1 hour prior to dental visit. 12 capsule 0   apixaban  (ELIQUIS ) 5 MG TABS tablet Take 1 tablet (5 mg total) by mouth 2 (two) times daily. 60 tablet 5   ASCORBIC ACID PO Take 1 tablet by mouth daily. Vitamin C, unknown strength.     aspirin  EC 81 MG tablet Take 81 mg by mouth daily.     atorvastatin  (LIPITOR ) 80 MG tablet Take 40 mg by mouth daily with  supper.     atorvastatin  (LIPITOR ) 40 MG tablet Take 1 tablet (40 mg total) by mouth daily. 90 tablet 3   azelastine (ASTELIN) 0.1 % nasal spray Place 1 spray into both nostrils daily. Use in each nostril as directed     bisacodyl  (DULCOLAX) 10 MG suppository Place 1 suppository (10 mg total) rectally as needed for moderate constipation. (Patient not taking: Reported on 11/07/2023) 12 suppository 0   Bromfenac  Sodium (PROLENSA ) 0.07 % SOLN Administer 1 drop into left eye three times daily. 3 mL 0   Bromfenac  Sodium (PROLENSA ) 0.07 % SOLN Place 1 drop into the left eye 3 (three) times daily for 3 days then stop 3 mL 1   camphor-menthol  (SARNA) lotion Apply topically if needed for itching, irritation or dry skin for up to 14 days. 222 mL 0   Clobetasol Propionate 0.05 % shampoo Apply 1 application  topically See admin instructions. Apply topically to scalp 2-3 times a week as needed for severe dry skin.     COCONUT OIL PO Take 1 capsule by mouth daily with lunch.     diphenhydrAMINE  [facility] RELIEF) 2 % cream Apply topically 2 (two) times a day if needed for itching for up to 14 days. 30 g 0   docusate sodium  (COLACE) 100 MG capsule Take 1 capsule (100 mg total) by mouth every 12 (twelve) hours. (Patient not taking: Reported on 11/07/2023) 60 capsule 0   donepezil  (ARICEPT ) 10 MG tablet Take 1 tablet (10 mg total) by mouth at bedtime. 90 tablet 3   erythromycin  ophthalmic ointment Place a small amount into the left eye 3 (three) times daily. 3.5 g 1   erythromycin  ophthalmic ointment Place 1 Application into both eyes at bedtime. 3.5 g 0   fluticasone  (FLONASE ) 50 MCG/ACT nasal spray Place 2 sprays into both nostrils daily. 16 g 1   isosorbide  mononitrate (IMDUR ) 30 MG 24 hr tablet Take 30 mg by mouth daily.     labetalol  (NORMODYNE ) 100 MG tablet Take 0.5 tablets (50 mg total) by mouth 2 (two) times daily. 45 tablet 3   linaclotide  (LINZESS ) 145 MCG CAPS capsule Take 1 capsule (145 mcg total) by mouth daily  before breakfast. (Patient not taking: Reported on 11/07/2023) 30 capsule 2   memantine  (NAMENDA ) 10 MG tablet Take 1 tablet (10 mg total) by mouth 2 (two) times daily. 180 tablet 3   methylPREDNISolone  (MEDROL  DOSEPAK) 4 MG TBPK tablet Follow schedule on package instructions 21 tablet 0   methylPREDNISolone  (MEDROL ) 4 MG TBPK tablet Take 6 tablets by mouth on day 1, Then 5 tablets by mouth on day 2, Then 4 tablets by mouth on day 3, Then 3 tablets by mouth on day 4, Then 2 tablets by mouth on day 5, Then 1  tablet by mouth on day 6. Then stop. 21 tablet 0   Multiple Vitamins-Minerals (PRESERVISION AREDS 2) CAPS Take 1 capsule by mouth 2 (two) times daily.      nitroGLYCERIN  (NITROSTAT ) 0.4 MG SL tablet PLACE 1 TABLET (0.4 MG TOTAL) UNDER THE TONGUE EVERY 5 (FIVE) MINUTES X 3 DOSES AS NEEDED FOR CHEST PAIN. 75 tablet 2   Polyethyl Glycol-Propyl Glycol (SYSTANE OP) Place 1 drop into both eyes 4 (four) times daily as needed (dry eyes, irritated eyes.).     ROCKLATAN  0.02-0.005 % SOLN Place 1 drop into the right eye at bedtime.     tolterodine (DETROL LA) 2 MG 24 hr capsule Take 2 mg by mouth daily.     traMADol  (ULTRAM ) 50 MG tablet Take 1 tablet (50 mg total) by mouth every 6 (six) hours as needed for moderate pain (4-6). 15 tablet 0   No facility-administered medications prior to visit.    PAST MEDICAL HISTORY: Past Medical History:  Diagnosis Date   Arthritis    CAD (coronary artery disease)    04/15/18 65% dLAD, 20% pLAD, 30% 2nd diag, Normal EF   Dysrhythmia    afib   Gallstones 07/1996   Headache    couple times a year   History of CT scan of head 1997   small vessel disease   History of ETT 05/2002    no EKG changes   History of MRI of cervical spine 07/15/2002   Hypertension    Lumbar spondylolysis 12/17/2016   MGUS (monoclonal gammopathy of unknown significance) 04/11/2012   Occipital neuralgia of left side 12/09/2018   Peripheral neuropathy    Pneumonia 10/2014   PONV (postoperative  nausea and vomiting)    once many years ago   Primary localized osteoarthritis of left hip 07/08/2018   Primary localized osteoarthrosis of left shoulder 12/25/2016   Primary localized osteoarthrosis of right shoulder 11/05/2017   Rectal bleeding 12/2002   colonoscopy   Seizures (HCC)    last seizure 1976   Skin cancer    Sleep apnea    uses CPAP   Spinal stenosis, lumbar region with neurogenic claudication    Stroke [facility])    ?tia ?afib   TIA (transient ischemic attack)    Ulnar neuropathy at elbow 12/24/2014   Left   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 extremity 03/29/2015    PAST SURGICAL HISTORY: Past Surgical History:  Procedure Laterality Date   ANTERIOR CERVICAL DECOMP/DISCECTOMY FUSION N/A 02/04/2015   Procedure: ANTERIOR CERVICAL DECOMPRESSION/DISCECTOMY FUSION CERVICAL FIVE-SIX,CERVICAL SIX-SEVEN;  Surgeon: Darina Boehringer, MD;  Location: [facility] NEUR[facility] OR[facility];  Service: Neurosurgery;  Laterality: N/A;   arthroscopic knee surgery     BACK SURGERY     CATARACT EXTRACTION Bilateral    COLONOSCOPY W/ POLYPECTOMY  04/19/2003   tubular adenoma   EYE SURGERY     Bilateral Cataracts   GREAT TOE ARTHRODESIS, INTERPHALANGEAL JOINT Left 1978   JOINT REPLACEMENT     joint replacement on right thumb Right    LEFT HEART CATH AND CORONARY ANGIOGRAPHY N/A 04/15/2018   Procedure: LEFT HEART CATH AND CORONARY ANGIOGRAPHY;  Surgeon: Burnard Debby LABOR, MD;  Location: [facility] INVASIVE CV [facility];  Service: Cardiovascular;  Laterality: N/A;   LUMBAR LAMINECTOMY/DECOMPRESSION MICRODISCECTOMY N/A 07/19/2017   Procedure: LAMINECTOMY AND FORAMINOTOMY  LUMBAR TWO- LUMBAR THREE, LUMBAR THREE- LUMBAR FOUR;  Surgeon: Gillie Duncans, MD;  Location: [facility] OR;  Service: Neurosurgery;  Laterality: N/A;   nerve transfer surgery to lwft hand Left  Nov. 2017   SHOULDER ARTHROSCOPY W/ ROTATOR CUFF REPAIR Left 2013   rotator cuff    SHOULDER INJECTION  03/2011   left, [facility]   SKIN CANCER EXCISION  07/1993   left lateral arm    SUPERFICIAL KERATECTOMY Right 05/1998   wrist   thumb injection  03/2011   left, [facility]   TOE SURGERY Right    TONSILLECTOMY AND ADENOIDECTOMY     TOTAL HIP ARTHROPLASTY Left 07/08/2018   TOTAL HIP ARTHROPLASTY Left 07/08/2018   Procedure: LEFT TOTAL HIP ARTHROPLASTY;  Surgeon: Josefina Chew, MD;  Location: [facility] OR;  Service: Orthopedics;  Laterality: Left;   TOTAL SHOULDER ARTHROPLASTY Left 12/25/2016   Procedure: TOTAL SHOULDER ARTHROPLASTY;  Surgeon: Chew Josefina, MD;  Location: [facility] OR;  Service: Orthopedics;  Laterality: Left;   TOTAL SHOULDER ARTHROPLASTY Right 11/05/2017   TOTAL SHOULDER ARTHROPLASTY Right 11/05/2017   Procedure: TOTAL SHOULDER ARTHROPLASTY;  Surgeon: Josefina Chew, MD;  Location: [facility] OR;  Service: Orthopedics;  Laterality: Right;   ulnar nerve transfer  12/1999   left   ULNAR NERVE TRANSPOSITION Left     FAMILY HISTORY: Family History  Problem Relation Age of Onset   Dementia Mother 69       vascular   Transient ischemic attack Mother    Dementia Father        after hip fracture   Hypertension Sister        skin cancer   Seizures Neg Hx    Sleep apnea Neg Hx    Neuropathy Neg Hx     SOCIAL HISTORY: Social History   Socioeconomic History   Marital status: Married    Spouse name: Almarie Lovely Rochers   Number of children: 2   Years of education: 19   Highest education level: Not on file  Occupational History   Occupation: retired    Associate Professor: UNEMPLOYED  Tobacco Use   Smoking status: Former    Current packs/day: 0.00    Average packs/day: 2.0 packs/day for 13.0 years (26.0 ttl pk-yrs)    Types: Cigarettes    Start date: 03/06/1955    Quit date: 03/05/1968    Years since quitting: 56.4   Smokeless tobacco: Never  Vaping Use   Vaping status: Never Used  Substance and Sexual Activity   Alcohol  use: Yes    Alcohol /week: 0.0 - 2.0 standard drinks of alcohol     Comment: social   Drug use: No   Sexual activity: Not Currently  Other Topics  Concern   Not on file  Social History Narrative   Lives with 4th wife, Almarie Lovely Rochers, married 2010   Retired, Actuary   Son, [place], [place], 2 children   Daughter, [place], in [place], 2 children   Patient is right handed   Patient drinks approximately 2 cups of caffeine daily   Social Drivers of Corporate investment banker Strain: Not on file  Food Insecurity: Low Risk  (05/21/2024)   Received from [facility]   Hunger Vital Sign    Within the past 12 months, you worried that your food would run out before you got money to buy more: Never true    Within the past 12 months, the food you bought just didn't last and you didn't have money to get more. : Never true  Transportation Needs: No Transportation Needs (05/21/2024)   Received from [facility]    In the past 12 months, has lack of reliable  transportation kept you from medical appointments, meetings, work or from getting things needed for daily living? : No  Physical Activity: Not on file  Stress: Not on file  Social Connections: Not on file  Intimate Partner Violence: Not At Risk (01/01/2023)   Humiliation, Afraid, Rape, and Kick questionnaire    Fear of Current or Ex-Partner: No    Emotionally Abused: No    Physically Abused: No    Sexually Abused: No     PHYSICAL EXAM  There were no vitals filed for this visit.  There is no height or weight on file to calculate BMI.  Generalized: Well developed, in no acute distress  Cardiology: normal rate and rhythm, no murmur noted Respiratory: clear to auscultation bilaterally  Neurological examination  Mentation: Alert oriented to time, place, and some history taking. Follows all commands speech and language fluent Cranial nerve II-XII: Pupils were equal round reactive to light. Extraocular movements were full, visual field were full  Motor: The motor testing reveals 5 over 5 strength of all 4 extremities. Good  symmetric motor tone is noted throughout.  Gait and station: Able to push to standing position with two attempts. Gait is short. Stable with holding his wife's hand. Not using assistive device, today.    DIAGNOSTIC DATA (LABS, IMAGING, TESTING) - I reviewed patient records, labs, notes, testing and imaging myself where available.      No data to display           Lab Results  Component Value Date   WBC 7.1 07/19/2023   HGB 13.3 07/19/2023   HCT 40.8 07/19/2023   MCV 99.8 07/19/2023   PLT 201 07/19/2023      Component Value Date/Time   NA 141 07/19/2023 1235   NA 142 04/18/2021 1058   K 3.6 07/19/2023 1235   CL 103 07/19/2023 1235   CO2 28 07/19/2023 1235   GLUCOSE 105 (H) 07/19/2023 1235   BUN 19 07/19/2023 1235   BUN 15 04/18/2021 1058   CREATININE 0.81 07/19/2023 1235   CALCIUM  9.3 07/19/2023 1235   PROT 6.6 07/19/2023 1235   PROT 6.2 04/18/2021 1058   ALBUMIN 4.2 07/19/2023 1235   ALBUMIN 4.2 04/18/2021 1058   AST 34 07/19/2023 1235   ALT 32 07/19/2023 1235   ALKPHOS 58 07/19/2023 1235   BILITOT 1.2 07/19/2023 1235   BILITOT 0.6 04/18/2021 1058   GFRNONAA >60 07/19/2023 1235   GFRAA >60 08/02/2019 1004   Lab Results  Component Value Date   CHOL 127 01/02/2023   HDL 43 01/02/2023   LDLCALC 57 01/02/2023   LDLDIRECT 120 (H) 08/03/2010   TRIG 134 01/02/2023   CHOLHDL 3.0 01/02/2023   Lab Results  Component Value Date   HGBA1C 5.9 (H) 01/02/2023   Lab Results  Component Value Date   VITAMINB12 426 04/18/2021   Lab Results  Component Value Date   TSH 1.190 04/18/2021     ASSESSMENT AND PLAN 88 y.o. year old male  has a past medical history of Arthritis, CAD (coronary artery disease), Dysrhythmia, Gallstones (07/1996), Headache, History of CT scan of head (1997), History of ETT (05/2002 ), History of MRI of cervical spine (07/15/2002), Hypertension, Lumbar spondylolysis (12/17/2016), MGUS (monoclonal gammopathy of unknown significance) (04/11/2012),  Occipital neuralgia of left side (12/09/2018), Peripheral neuropathy, Pneumonia (10/2014), PONV (postoperative nausea and vomiting), Primary localized osteoarthritis of left hip (07/08/2018), Primary localized osteoarthrosis of left shoulder (12/25/2016), Primary localized osteoarthrosis of right shoulder (11/05/2017), Rectal bleeding (12/2002), Seizures (HCC),  Skin cancer, Sleep apnea, Spinal stenosis, lumbar region with neurogenic claudication, Stroke [facility]), TIA (transient ischemic attack), Ulnar neuropathy at elbow (12/24/2014), and 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 extremity (03/29/2015). here with   No diagnosis found.    Jose [name] Jose [name]. is doing well on CPAP therapy. Compliance report reveals excellent compliance. He was encouraged to continue using CPAP nightly and for greater than 4 hours each night. We will update supply orders as indicated. Risks of untreated sleep apnea review and education materials provided. Neuropathy is stable. He would benefit from an assistive device. I have encouraged his wife to discuss hx of MGUS with PCP. May consider referral to hematology if needed. Healthy lifestyle habits encouraged. He will follow up in 1 year, sooner if needed. He verbalizes understanding and agreement with this plan.    No orders of the defined types were placed in this encounter.    No orders of the defined types were placed in this encounter.    I spent 30 minutes of face-to-face and non-face-to-face time with patient.  This included previsit chart review, lab review, study review, order entry, electronic health record documentation, patient education.   Greig Forbes, FNP-C 07/28/2024, 4:01 PM [facility] [address] [place], [place] [address] [phone]

## 2024-07-28 NOTE — Patient Instructions (Incomplete)

## 2024-07-29 NOTE — Progress Notes (Unsigned)
 SABRA

## 2024-07-30 ENCOUNTER — Encounter: Payer: Self-pay | Admitting: Family Medicine

## 2024-07-30 ENCOUNTER — Ambulatory Visit: Payer: Medicare Other | Admitting: Family Medicine

## 2024-07-30 VITALS — BP 126/78 | HR 72 | Ht 72.0 in | Wt 219.5 lb

## 2024-07-30 DIAGNOSIS — R413 Other amnesia: Secondary | ICD-10-CM

## 2024-07-30 DIAGNOSIS — R2681 Unsteadiness on feet: Secondary | ICD-10-CM

## 2024-07-30 DIAGNOSIS — G301 Alzheimer's disease with late onset: Secondary | ICD-10-CM

## 2024-07-30 DIAGNOSIS — G6289 Other specified polyneuropathies: Secondary | ICD-10-CM

## 2024-07-30 DIAGNOSIS — F0280 Dementia in other diseases classified elsewhere without behavioral disturbance: Secondary | ICD-10-CM

## 2024-07-30 DIAGNOSIS — G4733 Obstructive sleep apnea (adult) (pediatric): Secondary | ICD-10-CM

## 2024-07-30 MED ORDER — MEMANTINE HCL 10 MG PO TABS: 10.0000 mg | ORAL_TABLET | Freq: Two times a day (BID) | ORAL | 3 refills | Status: AC

## 2024-07-30 MED ORDER — DONEPEZIL HCL 10 MG PO TABS: 10.0000 mg | ORAL_TABLET | Freq: Every day | ORAL | 3 refills | Status: AC

## 2024-09-14 ENCOUNTER — Emergency Department (HOSPITAL_BASED_OUTPATIENT_CLINIC_OR_DEPARTMENT_OTHER)
Admission: EM | Admit: 2024-09-14 | Discharge: 2024-09-14 | Disposition: A | Discharge status: Home / Self Care | Attending: Emergency Medicine | Admitting: Emergency Medicine

## 2024-09-14 ENCOUNTER — Emergency Department (HOSPITAL_BASED_OUTPATIENT_CLINIC_OR_DEPARTMENT_OTHER)

## 2024-09-14 ENCOUNTER — Encounter (HOSPITAL_BASED_OUTPATIENT_CLINIC_OR_DEPARTMENT_OTHER): Payer: Self-pay

## 2024-09-14 ENCOUNTER — Other Ambulatory Visit: Payer: Self-pay

## 2024-09-14 DIAGNOSIS — W19XXXA Unspecified fall, initial encounter: Secondary | ICD-10-CM

## 2024-09-14 DIAGNOSIS — S0990XA Unspecified injury of head, initial encounter: Secondary | ICD-10-CM | POA: Diagnosis present

## 2024-09-14 DIAGNOSIS — G309 Alzheimer's disease, unspecified: Secondary | ICD-10-CM | POA: Diagnosis not present

## 2024-09-14 DIAGNOSIS — Z7901 Long term (current) use of anticoagulants: Secondary | ICD-10-CM | POA: Insufficient documentation

## 2024-09-14 DIAGNOSIS — S0083XA Contusion of other part of head, initial encounter: Secondary | ICD-10-CM | POA: Insufficient documentation

## 2024-09-14 DIAGNOSIS — I4891 Unspecified atrial fibrillation: Secondary | ICD-10-CM | POA: Insufficient documentation

## 2024-09-14 DIAGNOSIS — W010XXA Fall on same level from slipping, tripping and stumbling without subsequent striking against object, initial encounter: Secondary | ICD-10-CM | POA: Insufficient documentation

## 2024-09-14 NOTE — ED Provider Notes (Signed)
  EMERGENCY DEPARTMENT AT [facility] Provider Note   CSN: [id number] Arrival date & time: 09/14/24  1352     Patient presents with: Jose [name]. is a 88 y.o. male history of A-fib on chronic anticoagulation, Alzheimers, bronchiectasis here for evaluation of fall.  Tripped and fell about 1 week ago going out his garage steps, landed on the left side of his face, caught himself with his bilateral arms.  He initially had some headache, dizziness which resolved by the next day.  He denies any LOC.  He had a hematoma to his left forehead.  Since then he has had continued bruising going down the left side of his face which concerned him and his family.  He has some pain when he presses on his frontal bones on the left.  He denies any sudden onset thunderclap headache, syncope, neck pain, eye pain, vision changes, chest pain, shortness breath abdominal pain, upper or lower extremity pain, back pain, numbness or weakness.  Wife at bedside helps provide history.  He is at his normal mentation.  Able to give appropriate history per wife in room.  Family recommended patient to come here due to the bruising of the face and the fall on anticoagulation   HPI     Prior to Admission medications   Medication Sig Start Date End Date Taking? Authorizing Provider  acetaminophen  (TYLENOL ) 500 MG tablet Take 500 mg by mouth 3 (three) times daily.    [provider]  Aflibercept (EYLEA IZ) 1 Dose by Intravitreal route See admin instructions. 1 injection into each eye every 8-10 weeks.    [provider]  amLODipine  (NORVASC ) 5 MG tablet Take 5 mg by mouth daily. 06/08/19   [provider]  amoxicillin  (AMOXIL ) 500 MG capsule Take 4 capsules (2,000 mg total) by mouth 1 hour prior to dental visit. 03/25/24     apixaban  (ELIQUIS ) 5 MG TABS tablet Take 1 tablet (5 mg total) by mouth 2 (two) times daily. 06/19/24   Verlin Lonni BIRCH, MD  ASCORBIC ACID  PO Take 1 tablet by mouth daily. Vitamin C, unknown strength.    [provider]  aspirin  EC 81 MG tablet Take 81 mg by mouth daily.    [provider]  atorvastatin  (LIPITOR ) 40 MG tablet Take 1 tablet (40 mg total) by mouth daily. 05/21/24     azelastine (ASTELIN) 0.1 % nasal spray Place 1 spray into both nostrils daily. Use in each nostril as directed    [provider]  Bromfenac  Sodium (PROLENSA ) 0.07 % SOLN Place 1 drop into the left eye 3 (three) times daily for 3 days then stop 03/27/23     Clobetasol Propionate 0.05 % shampoo Apply 1 application  topically See admin instructions. Apply topically to scalp 2-3 times a week as needed for severe dry skin.    [provider]  COCONUT OIL PO Take 1 capsule by mouth daily with lunch.    [provider]  diphenhydrAMINE  [facility] RELIEF) 2 % cream Apply topically 2 (two) times a day if needed for itching for up to 14 days. 03/05/24     donepezil  (ARICEPT ) 10 MG tablet Take 1 tablet (10 mg total) by mouth at bedtime. 07/30/24   Lomax, Amy, NP  erythromycin  ophthalmic ointment Place a small amount into the left eye 3 (three) times daily. 04/25/23     fluticasone  (FLONASE ) 50 MCG/ACT nasal spray Place 2 sprays into both nostrils  daily. 08/29/22     isosorbide  mononitrate (IMDUR ) 30 MG 24 hr tablet Take 30 mg by mouth daily.    [provider]  labetalol  (NORMODYNE ) 100 MG tablet Take 0.5 tablets (50 mg total) by mouth 2 (two) times daily. 01/11/23   Verlin Lonni BIRCH, MD  memantine  (NAMENDA ) 10 MG tablet Take 1 tablet (10 mg total) by mouth 2 (two) times daily. 07/30/24   Lomax, Amy, NP  Multiple Vitamins-Minerals (PRESERVISION AREDS 2) CAPS Take 1 capsule by mouth 2 (two) times daily.     [provider]  nitroGLYCERIN  (NITROSTAT ) 0.4 MG SL tablet PLACE 1 TABLET (0.4 MG TOTAL) UNDER THE TONGUE EVERY 5 (FIVE) MINUTES X 3 DOSES AS NEEDED FOR CHEST PAIN. 03/03/21   Verlin Lonni BIRCH, MD   Polyethyl Glycol-Propyl Glycol (SYSTANE OP) Place 1 drop into both eyes 4 (four) times daily as needed (dry eyes, irritated eyes.).    [provider]  ROCKLATAN  0.02-0.005 % SOLN Place 1 drop into the right eye at bedtime. 04/10/19   [provider]  tolterodine (DETROL LA) 2 MG 24 hr capsule Take 2 mg by mouth daily.    [provider]    Allergies: Maprotiline and Sumycin [tetracycline]    Review of Systems  Constitutional: Negative.   HENT: Negative.    Respiratory: Negative.    Cardiovascular: Negative.   Gastrointestinal: Negative.   Genitourinary: Negative.   Musculoskeletal: Negative.   Skin:  Positive for color change.  Neurological:  Positive for headaches.  All other systems reviewed and are negative.   Updated Vital Signs BP 132/82   Pulse 62   Temp 97.6 F (36.4 C) (Oral)   Resp 16   SpO2 95%   Physical Exam Vitals and nursing note reviewed.  Constitutional:      General: He is not in acute distress.    Appearance: He is well-developed. He is not ill-appearing, toxic-appearing or diaphoretic.  HENT:     Head: Normocephalic.     Jaw: There is normal jaw occlusion.      Comments: Purple-yellow bruising to left forehead, lateral and inferior periorbital region into left zygomatic.  No crepitus or step-off.  No large hematoma, erythema or warmth.  No lacerations, abrasions.  No drooling, dysphagia or trismus    Ears:     Comments: No hemotympanums, bilateral hearing aids    Nose:     Comments: No epistaxis, nasal bridge nontender    Mouth/Throat:     Lips: Pink.     Mouth: Mucous membranes are moist.     Pharynx: Oropharynx is clear. Uvula midline.     Comments: Tongue midline.  No loose dentition Eyes:     General: No visual field deficit.    Extraocular Movements: Extraocular movements intact.     Conjunctiva/sclera: Conjunctivae normal.     Pupils: Pupils are equal, round, and reactive to light.     Visual Fields: Right eye  visual fields normal and left eye visual fields normal.     Comments: PERRLA, no conjunctival injection, subconjunctival hemorrhage.  No traumatic hyphema.  Neck:     Trachea: Trachea and phonation normal.     Comments: No midline tenderness, full range of motion Cardiovascular:     Rate and Rhythm: Normal rate and regular rhythm.     Pulses: Normal pulses.          Radial pulses are 2+ on the right side and 2+ on the left side.  Dorsalis pedis pulses are 2+ on the right side and 2+ on the left side.     Heart sounds: Normal heart sounds.  Pulmonary:     Effort: Pulmonary effort is normal. No respiratory distress.     Breath sounds: Normal breath sounds and air entry.     Comments: Clear bilaterally, speaks in full sentences without difficulty Chest:     Comments: Nontender chest wall, no crepitus or step-off Abdominal:     General: Bowel sounds are normal. There is no distension.     Palpations: Abdomen is soft.     Tenderness: There is no abdominal tenderness.     Comments: Soft, nontender.  No overlying skin changes.  No pulsatile abdominal mass  Musculoskeletal:        General: Normal range of motion.     Cervical back: Full passive range of motion without pain, normal range of motion and neck supple.     Comments: No midline C/T/L tenderness.  Nontender bilateral upper and lower extremities.  Full range of motion without difficulty.  Lymphadenopathy:     Cervical: No cervical adenopathy.  Skin:    General: Skin is warm and dry.     Capillary Refill: Capillary refill takes less than 2 seconds.     Comments: Yellow/purple bruising left forehead, lateral and inferior periorbital region into left zygomatic  Neurological:     General: No focal deficit present.     Mental Status: He is alert and oriented to person, place, and time.     Cranial Nerves: Cranial nerves 2-12 are intact.     Sensory: Sensation is intact.     Motor: Motor function is intact.     Coordination:  Coordination is intact.     Gait: Gait is intact.     (all labs ordered are listed, but only abnormal results are displayed) Labs Reviewed - No data to display  EKG: None  Radiology: CT Maxillofacial Wo Contrast Result Date: 09/14/2024 EXAM: CT OF THE FACE WITHOUT CONTRAST 09/14/2024 02:44:49 PM TECHNIQUE: CT of the face was performed without the administration of intravenous contrast. Multiplanar reformatted images are provided for review. Automated exposure control, iterative reconstruction, and/or weight based adjustment of the mA/kV was utilized to reduce the radiation dose to as low as reasonably achievable. COMPARISON: CTA head and neck 01/01/2023. CLINICAL HISTORY: Facial trauma, blunt. Fall 1 week ago with left orbital and forehead bruising, headache, and dizziness. FINDINGS: FACIAL BONES: No acute facial fracture. No mandibular dislocation. No suspicious bone lesion. ORBITS: Bilateral cataract extraction. Left periorbital soft tissue swelling. No retrobulbar hematoma. SINUSES AND MASTOIDS: No acute abnormality. SOFT TISSUES: Otherwise nonacute. IMPRESSION: 1. No acute facial fracture. 2. Left periorbital soft tissue swelling. Electronically signed by: Dasie Hamburg MD 09/14/2024 03:06 PM EST RP Workstation: [id number]   CT Cervical Spine Wo Contrast Result Date: 09/14/2024 EXAM: CT CERVICAL SPINE WITHOUT CONTRAST 09/14/2024 02:44:49 PM TECHNIQUE: CT of the cervical spine was performed without the administration of intravenous contrast. Multiplanar reformatted images are provided for review. Automated exposure control, iterative reconstruction, and/or weight based adjustment of the mA/kV was utilized to reduce the radiation dose to as low as reasonably achievable. COMPARISON: Cervical spine CT 10/10/2021. CLINICAL HISTORY: Neck trauma (Age >= 65y). Fall 1 week ago with left orbital and forehead bruising, headache, and dizziness. FINDINGS: CERVICAL SPINE: BONES AND ALIGNMENT: Cervical spine  straightening. No significant listhesis. No acute fracture or suspicious lesion. Asymmetrically advanced left sided C1-C2 arthropathy. C5-C7 ACDF. DEGENERATIVE CHANGES: Left facet  ankylosis at C2-C3. Bilateral facet ankylosis at C4-C5. Uncovertebral spurring and mild to moderate right and severe left facet arthrosis at C3-C4 result in severe bilateral neural foraminal stenosis. SOFT TISSUES: No prevertebral soft tissue swelling. IMPRESSION: 1. No acute cervical spine fracture or traumatic malalignment. 2. Postoperative and degenerative changes as above. Electronically signed by: Dasie Hamburg MD 09/14/2024 03:03 PM EST RP Workstation: [id number]   CT Head Wo Contrast Result Date: 09/14/2024 EXAM: CT HEAD WITHOUT CONTRAST 09/14/2024 02:44:49 PM TECHNIQUE: CT of the head was performed without the administration of intravenous contrast. Automated exposure control, iterative reconstruction, and/or weight based adjustment of the mA/kV was utilized to reduce the radiation dose to as low as reasonably achievable. COMPARISON: Head CT 03/28/2023 and MRI 01/01/2023. CLINICAL HISTORY: Head trauma, moderate-severe. Fall 1 week ago with left orbital and forehead bruising, headache, and dizziness. FINDINGS: BRAIN AND VENTRICLES: There is no evidence of an acute infarct, intracranial hemorrhage, mass, midline shift, hydrocephalus, or extra-axial fluid collection. Cerebral atrophy is mild for age. Hypodensities in the cerebral white matter are nonspecific but compatible with mild chronic small vessel ischemic disease. A small chronic right cerebellar infarct is noted. Calcified atherosclerosis at the skull base. ORBITS: Bilateral cataract extraction. SINUSES: No acute abnormality. SOFT TISSUES AND SKULL: No acute soft tissue abnormality. No skull fracture. IMPRESSION: 1. No acute intracranial abnormality. 2. Mild chronic small vessel ischemic disease. Electronically signed by: Dasie Hamburg MD 09/14/2024 03:00 PM EST RP  Workstation: [id number]     Procedures   Medications Ordered in the ED - No data to display  88 year old here for evaluation of mechanical fall.  Patient states he fell about a week ago in his garage.  States he tripped and fell.  He denies any syncope or near syncope.  Was able to ambulate afterwards.  Since then he has had progressive bruising going down the left side of his face. Does have some old bruising from his left forehead to lateral periorbital inferior periorbital region into his left zygomatic.  No crepitus or step-off.  He has no midline C/T/L tenderness.  Nontender about upper and lower extremities.  Has a nonfocal neuroexam.  Feeling was concerned due to the progressive bruising going down the face.  Here he appears otherwise well.  Will plan on CT imaging  Imaging personally viewed and interpreted:  CT head, max face, cervical without acute traumatic injury  Discussed results with patient, family in room.  I suspect his bruising likely from forehead hematoma which is subsequently going into his face.  Reassuring imaging.  Family and patient and patient states this was a purely mechanical fall.  He is able to eat and drink at home he is ambulatory has a reassuring neurologic workup denies any chest pain, shortness of breath, near/syncope, vision changes.  Will have him follow back up with his PCP.  Considered syncope/arrhythmia, traumatic injury such as head bleed, retinal detachment, acute intracranial, intrathoracic, intra-abdominal, neurosurgical traumatic injury however based off patient's history, exam low suspicion for above.  The patient has been appropriately medically screened and/or stabilized in the ED. I have low suspicion for any other emergent medical condition which would require further screening, evaluation or treatment in the ED or require inpatient management.  Patient is hemodynamically stable and in no acute distress.  Patient able to ambulate in department  prior to ED.  Evaluation does not show acute pathology that would require ongoing or additional emergent interventions while in the emergency department or further inpatient treatment.  I  have discussed the diagnosis with the patient and answered all questions.  Pain is been managed while in the emergency department and patient has no further complaints prior to discharge.  Patient is comfortable with plan discussed in room and is stable for discharge at this time.  I have discussed strict return precautions for returning to the emergency department.  Patient was encouraged to follow-up with PCP/specialist refer to at discharge.                                   Medical Decision Making Amount and/or Complexity of Data Reviewed Independent Historian: spouse External Data Reviewed: labs, radiology and notes. Radiology: ordered and independent interpretation performed. Decision-making details documented in ED Course.  Risk OTC drugs. Decision regarding hospitalization. Diagnosis or treatment significantly limited by social determinants of health.        Final diagnoses:  Fall, initial encounter  Chronic anticoagulation    ED Discharge Orders     None          Corbett Moulder A, PA-C 09/14/24 1522    Jose Lamar BROCKS, MD 09/16/24 [phone]

## 2024-09-14 NOTE — ED Notes (Signed)
 D/c paperwork reviewed with pt and pts spouse at bedside, including follow up care.  All questions and/or concerns addressed at time of d/c.  No further needs expressed. . Pt verbalized understanding, Ambulatory with significant other to ED exit, NAD.

## 2024-09-14 NOTE — ED Triage Notes (Signed)
 Reports fall 1 week ago. States tripped over something in garage. Bruising noted to L eye and forehead. Headache, dizziness.  No LOC  On eliquis   A&Ox4, ambulatory

## 2024-09-14 NOTE — Discharge Instructions (Signed)
 It was a pleasure taking care of you here today.  Your CT scan did not show any evidence of broken bones, head bleeds.  Make sure to follow-up outpatient with primary care provider or specialist for reevaluation  May take Tylenol  as needed for pain do not take more than 4000 mg in a 24-hour period  You may continue to have bruising down your face which is to be expected  Return for new or worsening symptoms

## 2024-09-21 ENCOUNTER — Other Ambulatory Visit (HOSPITAL_BASED_OUTPATIENT_CLINIC_OR_DEPARTMENT_OTHER): Payer: Self-pay

## 2024-09-21 MED ORDER — PREDNISONE 10 MG PO TABS
ORAL_TABLET | ORAL | 0 refills | Status: AC
  Filled 2024-09-21 (×2): qty 21, 6d supply, fill #0

## 2024-09-22 ENCOUNTER — Other Ambulatory Visit: Payer: Self-pay

## 2024-09-30 ENCOUNTER — Other Ambulatory Visit (HOSPITAL_BASED_OUTPATIENT_CLINIC_OR_DEPARTMENT_OTHER): Payer: Self-pay

## 2024-09-30 MED ORDER — METHYLPREDNISOLONE 4 MG PO TBPK
ORAL_TABLET | ORAL | 0 refills | Status: DC
  Filled 2024-09-30: qty 21, 6d supply, fill #0

## 2024-10-07 ENCOUNTER — Other Ambulatory Visit: Payer: Self-pay

## 2024-10-07 ENCOUNTER — Emergency Department (HOSPITAL_COMMUNITY)

## 2024-10-07 ENCOUNTER — Other Ambulatory Visit (HOSPITAL_BASED_OUTPATIENT_CLINIC_OR_DEPARTMENT_OTHER): Payer: Self-pay

## 2024-10-07 ENCOUNTER — Emergency Department (HOSPITAL_COMMUNITY)
Admission: EM | Admit: 2024-10-07 | Discharge: 2024-10-07 | Disposition: A | Discharge status: Home / Self Care | Attending: Emergency Medicine | Admitting: Emergency Medicine

## 2024-10-07 DIAGNOSIS — E861 Hypovolemia: Secondary | ICD-10-CM | POA: Insufficient documentation

## 2024-10-07 DIAGNOSIS — R059 Cough, unspecified: Secondary | ICD-10-CM | POA: Insufficient documentation

## 2024-10-07 DIAGNOSIS — Z7901 Long term (current) use of anticoagulants: Secondary | ICD-10-CM | POA: Diagnosis not present

## 2024-10-07 DIAGNOSIS — I959 Hypotension, unspecified: Secondary | ICD-10-CM | POA: Insufficient documentation

## 2024-10-07 DIAGNOSIS — R197 Diarrhea, unspecified: Secondary | ICD-10-CM | POA: Insufficient documentation

## 2024-10-07 DIAGNOSIS — Z7982 Long term (current) use of aspirin: Secondary | ICD-10-CM | POA: Diagnosis not present

## 2024-10-07 DIAGNOSIS — R531 Weakness: Secondary | ICD-10-CM

## 2024-10-07 DIAGNOSIS — R112 Nausea with vomiting, unspecified: Secondary | ICD-10-CM | POA: Diagnosis present

## 2024-10-07 LAB — URINALYSIS, ROUTINE W REFLEX MICROSCOPIC
Bilirubin Urine: NEGATIVE
Glucose, UA: NEGATIVE mg/dL
Hgb urine dipstick: NEGATIVE
Ketones, ur: NEGATIVE mg/dL
Leukocytes,Ua: NEGATIVE
Nitrite: NEGATIVE
Protein, ur: NEGATIVE mg/dL
Specific Gravity, Urine: 1.025 (ref 1.005–1.030)
pH: 6 (ref 5.0–8.0)

## 2024-10-07 LAB — CBC WITH DIFFERENTIAL/PLATELET
Abs Immature Granulocytes: 0.04 K/uL (ref 0.00–0.07)
Basophils Absolute: 0 K/uL (ref 0.0–0.1)
Basophils Relative: 1 %
Eosinophils Absolute: 0.1 K/uL (ref 0.0–0.5)
Eosinophils Relative: 1 %
HCT: 38.2 % — ABNORMAL LOW (ref 39.0–52.0)
Hemoglobin: 12.6 g/dL — ABNORMAL LOW (ref 13.0–17.0)
Immature Granulocytes: 1 %
Lymphocytes Relative: 13 %
Lymphs Abs: 0.9 K/uL (ref 0.7–4.0)
MCH: 32.6 pg (ref 26.0–34.0)
MCHC: 33 g/dL (ref 30.0–36.0)
MCV: 99 fL (ref 80.0–100.0)
Monocytes Absolute: 0.8 K/uL (ref 0.1–1.0)
Monocytes Relative: 10 %
Neutro Abs: 5.5 K/uL (ref 1.7–7.7)
Neutrophils Relative %: 74 %
Platelets: 165 K/uL (ref 150–400)
RBC: 3.86 MIL/uL — ABNORMAL LOW (ref 4.22–5.81)
RDW: 13.8 % (ref 11.5–15.5)
WBC: 7.3 K/uL (ref 4.0–10.5)
nRBC: 0 % (ref 0.0–0.2)

## 2024-10-07 LAB — COMPREHENSIVE METABOLIC PANEL WITH GFR
ALT: 37 U/L (ref 0–44)
AST: 34 U/L (ref 15–41)
Albumin: 3 g/dL — ABNORMAL LOW (ref 3.5–5.0)
Alkaline Phosphatase: 46 U/L (ref 38–126)
Anion gap: 7 (ref 5–15)
BUN: 25 mg/dL — ABNORMAL HIGH (ref 8–23)
CO2: 25 mmol/L (ref 22–32)
Calcium: 8 mg/dL — ABNORMAL LOW (ref 8.9–10.3)
Chloride: 105 mmol/L (ref 98–111)
Creatinine, Ser: 0.99 mg/dL (ref 0.61–1.24)
GFR, Estimated: >60 mL/min (ref >60)
Glucose, Bld: 127 mg/dL — ABNORMAL HIGH (ref 70–99)
Potassium: 3.7 mmol/L (ref 3.5–5.1)
Sodium: 137 mmol/L (ref 135–145)
Total Bilirubin: 1.5 mg/dL — ABNORMAL HIGH (ref 0.0–1.2)
Total Protein: 5.2 g/dL — ABNORMAL LOW (ref 6.5–8.1)

## 2024-10-07 LAB — I-STAT CG4 LACTIC ACID, ED
Lactic Acid, Venous: 1.3 mmol/L (ref 0.5–1.9)
Lactic Acid, Venous: 2.1 mmol/L (ref 0.5–1.9)

## 2024-10-07 LAB — SARS CORONAVIRUS 2 BY RT PCR: SARS Coronavirus 2 by RT PCR: NEGATIVE

## 2024-10-07 MED ORDER — LACTATED RINGERS IV BOLUS
1000.0000 mL | Freq: Once | INTRAVENOUS | Status: AC
  Administered 2024-10-07: 1000 mL via INTRAVENOUS

## 2024-10-07 MED ORDER — GUAIFENESIN 100 MG/5ML PO LIQD
5.0000 mL | Freq: Once | ORAL | Status: AC
  Administered 2024-10-07: 5 mL via ORAL
  Filled 2024-10-07: qty 10

## 2024-10-07 MED ORDER — SODIUM CHLORIDE 0.9 % IV BOLUS
1000.0000 mL | Freq: Once | INTRAVENOUS | Status: AC
  Administered 2024-10-07: 1000 mL via INTRAVENOUS

## 2024-10-07 MED ORDER — GUAIFENESIN 100 MG/5ML PO LIQD
100.0000 mg | ORAL | 0 refills | Status: AC | PRN
  Filled 2024-10-07: qty 118, 2d supply, fill #0

## 2024-10-07 NOTE — ED Provider Notes (Signed)
 Here patient is some from Dr. Garrick.  This patient presented for generalized weakness in the setting of recent cough and GI symptoms.  His diarrhea has resolved.  He was seen in urgent care and there was concern of hypotension.  This is likely secondary to hypovolemia.  His blood pressure has improved with IV fluids.  He is awaiting COVID/flu/UA.  Discharge is anticipated. Physical Exam  BP 106/61   Pulse 74   Temp 98.1 F (36.7 C) (Oral)   Resp 19   SpO2 98%   Physical Exam Vitals and nursing note reviewed.  Constitutional:      General: He is not in acute distress.    Appearance: Normal appearance. He is well-developed. He is not ill-appearing, toxic-appearing or diaphoretic.  HENT:     Head: Normocephalic and atraumatic.     Right Ear: External ear normal.     Left Ear: External ear normal.     Nose: Nose normal.     Mouth/Throat:     Mouth: Mucous membranes are moist.  Eyes:     Extraocular Movements: Extraocular movements intact.     Conjunctiva/sclera: Conjunctivae normal.  Cardiovascular:     Rate and Rhythm: Normal rate and regular rhythm.  Pulmonary:     Effort: Pulmonary effort is normal. No respiratory distress.  Abdominal:     General: Abdomen is flat. There is no distension.  Musculoskeletal:        General: No swelling.     Cervical back: Normal range of motion and neck supple.  Skin:    General: Skin is warm and dry.     Coloration: Skin is not jaundiced or pale.  Neurological:     General: No focal deficit present.     Mental Status: He is alert.  Psychiatric:        Mood and Affect: Mood normal.        Behavior: Behavior normal.     Procedures  Procedures  ED Course / MDM    Medical Decision Making Amount and/or Complexity of Data Reviewed Labs: ordered. Radiology: ordered.  Risk OTC drugs.   COVID testing was negative.  Urinalysis does not show evidence of infection.  Patient remains asymptomatic.  He remains normotensive.  He does  request discharge home.  He did also request prescription for guaifenesin .  This was provided.  He was discharged in good condition.       Melvenia Motto, MD 10/07/24 [phone]

## 2024-10-07 NOTE — ED Notes (Signed)
 Pt. Verbalized that he was ready to go home

## 2024-10-07 NOTE — ED Triage Notes (Signed)
 Pt. BIB [facility] from [facility] with c/o hypotension; Per [facility] the [facility] reported a BP of 60 sys., but [facility] states that the lowest they got was 92/565; Pt. Only c/o overall fatigue and weakness that started around 5 days ago; Pt. Has also been having some N/V and diarrhea. Pt. Has a Hx of A-fib, Alzheimer's, and is on eliquis . Pt. Has a 20G in R FA.  VS per [facility]:  HR: 68 RR: 16 SpO2: 64% CBG: 164

## 2024-10-07 NOTE — ED Notes (Signed)
 EDP at [facility]

## 2024-10-07 NOTE — ED Provider Notes (Signed)
 [place] EMERGENCY DEPARTMENT AT [facility] Provider Note   CSN: [id number] Arrival date & time: 10/07/24  1239     Patient presents with: No chief complaint on file.   Jose Talent. is a 88 y.o. male.   HPI   Per urgent care: Patient is an 88 year old male, brought to the clinic today by his spouse for reports of patient developing congestion on Saturday, 10/03/2024, then developing into nausea with emesis as well as diarrhea on the following Sunday, 10/04/2024. Since then, patient has had a progressive weakness with deep cough per spouse, and has seemed more tired, fatigued, with myalgias since then. Spouse reports patient has had no further diarrhea or emesis, and she has been giving him a [employer], however, she is concerned for his continued significant fatigue and weakness. Patient with no hematemesis nor melena. Patient describes feeling as though he was hit by a truck today. Patient with history of Alzheimer's, neuropathy, struct of sleep apnea, hypertension, atrial fibrillation, with Eliquis , atherosclerosis and osteoarthritis.     Patient himself denies current pain, states that he has had cough, as well as increased mucus production over the past few weeks. No diarrhea over the past few days.  Events the patient's wife is also present, she corroborates history, as the patient does have some memory loss.  Prior to Admission medications   Medication Sig Start Date End Date Taking? Authorizing Provider  acetaminophen  (TYLENOL ) 500 MG tablet Take 500 mg by mouth 3 (three) times daily.    [provider]  Aflibercept (EYLEA IZ) 1 Dose by Intravitreal route See admin instructions. 1 injection into each eye every 8-10 weeks.    [provider]  amLODipine  (NORVASC ) 5 MG tablet Take 5 mg by mouth daily. 06/08/19   [provider]  amoxicillin  (AMOXIL ) 500 MG capsule Take 4 capsules (2,000 mg total) by mouth 1 hour prior to dental visit.  03/25/24     apixaban  (ELIQUIS ) 5 MG TABS tablet Take 1 tablet (5 mg total) by mouth 2 (two) times daily. 06/19/24   Verlin Lonni BIRCH, MD  ASCORBIC ACID PO Take 1 tablet by mouth daily. Vitamin C, unknown strength.    [provider]  aspirin  EC 81 MG tablet Take 81 mg by mouth daily.    [provider]  atorvastatin  (LIPITOR ) 40 MG tablet Take 1 tablet (40 mg total) by mouth daily. 05/21/24     azelastine (ASTELIN) 0.1 % nasal spray Place 1 spray into both nostrils daily. Use in each nostril as directed    [provider]  Bromfenac  Sodium (PROLENSA ) 0.07 % SOLN Place 1 drop into the left eye 3 (three) times daily for 3 days then stop 03/27/23     Clobetasol Propionate 0.05 % shampoo Apply 1 application  topically See admin instructions. Apply topically to scalp 2-3 times a week as needed for severe dry skin.    [provider]  COCONUT OIL PO Take 1 capsule by mouth daily with lunch.    [provider]  diphenhydrAMINE  [facility] RELIEF) 2 % cream Apply topically 2 (two) times a day if needed for itching for up to 14 days. 03/05/24     donepezil  (ARICEPT ) 10 MG tablet Take 1 tablet (10 mg total) by mouth at bedtime. 07/30/24   Lomax, Amy, NP  erythromycin  ophthalmic ointment Place a small amount into the left eye 3 (three) times daily. 04/25/23     fluticasone  (FLONASE ) 50 MCG/ACT nasal spray  Place 2 sprays into both nostrils daily. 08/29/22     isosorbide  mononitrate (IMDUR ) 30 MG 24 hr tablet Take 30 mg by mouth daily.    [provider]  labetalol  (NORMODYNE ) 100 MG tablet Take 0.5 tablets (50 mg total) by mouth 2 (two) times daily. 01/11/23   Verlin Lonni BIRCH, MD  memantine  (NAMENDA ) 10 MG tablet Take 1 tablet (10 mg total) by mouth 2 (two) times daily. 07/30/24   Lomax, Amy, NP  methylPREDNISolone  (MEDROL ) 4 MG TBPK tablet Use as directed. 09/30/24     Multiple Vitamins-Minerals (PRESERVISION AREDS 2) CAPS Take 1 capsule by mouth 2 (two)  times daily.     [provider]  nitroGLYCERIN  (NITROSTAT ) 0.4 MG SL tablet PLACE 1 TABLET (0.4 MG TOTAL) UNDER THE TONGUE EVERY 5 (FIVE) MINUTES X 3 DOSES AS NEEDED FOR CHEST PAIN. 03/03/21   Verlin Lonni BIRCH, MD  Polyethyl Glycol-Propyl Glycol (SYSTANE OP) Place 1 drop into both eyes 4 (four) times daily as needed (dry eyes, irritated eyes.).    [provider]  ROCKLATAN  0.02-0.005 % SOLN Place 1 drop into the right eye at bedtime. 04/10/19   [provider]  tolterodine (DETROL LA) 2 MG 24 hr capsule Take 2 mg by mouth daily.    [provider]    Allergies: Maprotiline and Sumycin [tetracycline]    Review of Systems  Updated Vital Signs There were no vitals taken for this visit.  Physical Exam  (all labs ordered are listed, but only abnormal results are displayed) Labs Reviewed - No data to display  EKG: None  Radiology: No results found.   Procedures   Medications Ordered in the ED - No data to display                                  Medical Decision Making Elderly male presents via EMS with concern for hypotension from urgent care.  With patient's age, memory loss, recent diarrhea, broad differential including medication noncompliance, overdose, unintentional of antihypertensives, dehydration, urinary tract infection, bacteremia, sepsis. Cardiac 60 sinus normal pulse ox 98% room air normal  Amount and/or Complexity of Data Reviewed Independent Historian: spouse and EMS External Data Reviewed: notes. Labs: ordered. Decision-making details documented in ED Course. Radiology: ordered and independent interpretation performed. Decision-making details documented in ED Course. ECG/medicine tests: ordered and independent interpretation performed. Decision-making details documented in ED Course.  Risk OTC drugs.  Initial blood pressure low, patient receiving fluids.,  Initial vitals otherwise reassuring.  3:23 PM Labs notable  for trivial lactic acidosis no leukocytosis, no substantial anemia, chest x-ray without pneumonia, ECG without ischemia, or arrhythmia. Patient will receive additional fluids. Should patient's blood pressure improved patient may be appropriate for discharge given his reassuring findings, suspicion for mild dehydration, likely secondary to his ongoing URI-like illness as well as diarrhea contributing to his presentation.   Final diagnoses:  Weakness  Hypotension due to hypovolemia    ED Discharge Orders     None          Garrick Charleston, MD 10/07/24 1524

## 2024-10-07 NOTE — ED Notes (Signed)
 CCMD called.

## 2024-10-07 NOTE — ED Notes (Signed)
 Patient transported to X-ray

## 2024-10-07 NOTE — Discharge Instructions (Addendum)
 Be sure to follow-up with your physician for appropriate ongoing management of your cough, weakness, and low blood pressure. Return here for concerning changes in your condition.

## 2024-10-09 NOTE — Therapy (Incomplete)
 OUTPATIENT PHYSICAL THERAPY LOWER EXTREMITY EVALUATION   Patient Name: Jose [name]. MRN: [id number] DOB:[birth year], 88 y.o., male Today's Date: 10/09/2024   END OF SESSION:   Past Medical History:  Diagnosis Date   Arthritis    CAD (coronary artery disease)    04/15/18 65% dLAD, 20% pLAD, 30% 2nd diag, Normal EF   Dysrhythmia    afib   Gallstones 07/1996   Headache    couple times a year   History of CT scan of head 1997   small vessel disease   History of ETT 05/2002    no EKG changes   History of MRI of cervical spine 07/15/2002   Hypertension    Lumbar spondylolysis 12/17/2016   MGUS (monoclonal gammopathy of unknown significance) 04/11/2012   Occipital neuralgia of left side 12/09/2018   Peripheral neuropathy    Pneumonia 10/2014   PONV (postoperative nausea and vomiting)    once many years ago   Primary localized osteoarthritis of left hip 07/08/2018   Primary localized osteoarthrosis of left shoulder 12/25/2016   Primary localized osteoarthrosis of right shoulder 11/05/2017   Rectal bleeding 12/2002   colonoscopy   Seizures (HCC)    last seizure 1976   Skin cancer    Sleep apnea    uses CPAP   Spinal stenosis, lumbar region with neurogenic claudication    Stroke [facility])    ?tia ?afib   TIA (transient ischemic attack)    Ulnar neuropathy at elbow 12/24/2014   Left   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 extremity 03/29/2015   Past Surgical History:  Procedure Laterality Date   ANTERIOR CERVICAL DECOMP/DISCECTOMY FUSION N/A 02/04/2015   Procedure: ANTERIOR CERVICAL DECOMPRESSION/DISCECTOMY FUSION CERVICAL FIVE-SIX,CERVICAL SIX-SEVEN;  Surgeon: Darina Boehringer, MD;  Location: [facility] NEURO ORS;  Service: Neurosurgery;  Laterality: N/A;   arthroscopic knee surgery     BACK SURGERY     CATARACT EXTRACTION Bilateral    COLONOSCOPY W/ POLYPECTOMY  04/19/2003   tubular adenoma   EYE SURGERY     Bilateral Cataracts   GREAT TOE ARTHRODESIS, INTERPHALANGEAL JOINT Left 1978    JOINT REPLACEMENT     joint replacement on right thumb Right    LEFT HEART CATH AND CORONARY ANGIOGRAPHY N/A 04/15/2018   Procedure: LEFT HEART CATH AND CORONARY ANGIOGRAPHY;  Surgeon: Burnard Debby LABOR, MD;  Location: [facility] INVASIVE CV LAB;  Service: Cardiovascular;  Laterality: N/A;   LUMBAR LAMINECTOMY/DECOMPRESSION MICRODISCECTOMY N/A 07/19/2017   Procedure: LAMINECTOMY AND FORAMINOTOMY  LUMBAR TWO- LUMBAR THREE, LUMBAR THREE- LUMBAR FOUR;  Surgeon: Gillie Duncans, MD;  Location: [facility] OR;  Service: Neurosurgery;  Laterality: N/A;   nerve transfer surgery to lwft hand Left    Nov. 2017   SHOULDER ARTHROSCOPY W/ ROTATOR CUFF REPAIR Left 2013   rotator cuff    SHOULDER INJECTION  03/2011   left, [facility]   SKIN CANCER EXCISION  07/1993   left lateral arm   SUPERFICIAL KERATECTOMY Right 05/1998   wrist   thumb injection  03/2011   left, [facility]   TOE SURGERY Right    TONSILLECTOMY AND ADENOIDECTOMY     TOTAL HIP ARTHROPLASTY Left 07/08/2018   TOTAL HIP ARTHROPLASTY Left 07/08/2018   Procedure: LEFT TOTAL HIP ARTHROPLASTY;  Surgeon: Josefina Chew, MD;  Location: [facility] OR;  Service: Orthopedics;  Laterality: Left;   TOTAL SHOULDER ARTHROPLASTY Left 12/25/2016   Procedure: TOTAL SHOULDER ARTHROPLASTY;  Surgeon: Chew Josefina, MD;  Location: [facility] OR;  Service: Orthopedics;  Laterality: Left;  TOTAL SHOULDER ARTHROPLASTY Right 11/05/2017   TOTAL SHOULDER ARTHROPLASTY Right 11/05/2017   Procedure: TOTAL SHOULDER ARTHROPLASTY;  Surgeon: Josefina Chew, MD;  Location: [facility] OR;  Service: Orthopedics;  Laterality: Right;   ulnar nerve transfer  12/1999   left   ULNAR NERVE TRANSPOSITION Left    Patient Active Problem List   Diagnosis Date Noted   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   Nasal septal deviation 05/30/2023   Globus sensation 05/30/2023   Late onset Alzheimer disease (HCC) 04/16/2022   Laryngopharyngeal reflux 03/28/2022   Epistaxis 03/28/2022   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   Overactive  bladder 09/14/2021   Chronic anticoagulation 09/14/2021   Memory loss 04/18/2021   Slow transit constipation 02/15/2021   Obstructive sleep apnea 09/22/2019   Occipital neuralgia of left side 12/09/2018   Primary localized osteoarthritis of left hip 07/08/2018   Primary localized osteoarthritis of hip 07/08/2018   Atherosclerosis of native coronary artery of native heart with angina pectoris 04/16/2018   Primary localized osteoarthrosis of right shoulder 11/05/2017   Primary localized osteoarthrosis of shoulder 11/05/2017   Vertigo 10/30/2017   Lumbar stenosis with neurogenic claudication 07/19/2017   Primary localized osteoarthrosis of left shoulder 12/25/2016   S/P shoulder replacement 12/25/2016   Lumbar spondylolysis 12/17/2016   Paresthesia 01/24/2016   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 extremity 03/29/2015   Cervical spinal stenosis 02/04/2015   Bronchiectasis without acute exacerbation (HCC) 01/11/2015   TIA (transient ischemic attack) 12/15/2014   Hyperlipidemia 11/24/2014   PAF (paroxysmal atrial fibrillation) (HCC)    MGUS (monoclonal gammopathy of unknown significance) 04/11/2012   Gout, unspecified 10/04/2009   BPH (benign prostatic hyperplasia) 12/14/2008   ALLERGIC CONJUNCTIVITIS 12/03/2007   TESTOSTERONE  DEFICIENCY 11/14/2007   Thoracic back pain 04/22/2007   OBESITY, NOS 12/26/2006   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  RATIONALE FOR EVALUATION AND TREATMENT: Rehabilitation  ONSET DATE: 2-3  weeks ago  NEXT MD VISIT:    SUBJECTIVE:  SUBJECTIVE STATEMENT: 88 y/o patient referred to PT from Dr Josefina for L hip pain and weakness.   Patient reports pain started around Thanksgiving when he was moving heavy furniture and had a lot of increased activity.   Reports the pain is more lateral and posterior L hip.   Xrays were normal per ortho with no loosening of his THA prosthesis.   PAIN: Are you having pain? Yes: NPRS scale: *** Pain location: L hip Pain description: *** Aggravating factors: *** Relieving factors: ***  PERTINENT HISTORY:  Multi joint OA,  B TSAs, L THA, Alzheimer's, CVA, CAD  PRECAUTIONS: {Therapy precautions:24002}  RED FLAGS: {PT Red Flags:29287}  WEIGHT BEARING RESTRICTIONS: No  FALLS:  Has patient fallen in last 6 months? {fallsyesno:27318}  LIVING ENVIRONMENT: Lives with: {OPRC lives with:25569::lives with their family} Lives in: {Lives in:25570} Stairs: {opstairs:27293} Has following equipment at home: {Assistive devices:23999}  OCCUPATION: ***  PLOF: {PLOF:24004}  PATIENT GOALS: ***   OBJECTIVE: (objective measures completed at initial evaluation unless otherwise dated)  DIAGNOSTIC FINDINGS:  Unremarkable per ortho note  PATIENT SURVEYS:  LEFS  Extreme difficulty/unable (0), Quite a bit of difficulty (1), Moderate difficulty (2), Little difficulty (3), No difficulty (4) Survey date:    Any of your usual work, housework or school activities   2. Usual hobbies, recreational or sporting activities   3. Getting into/out of the bath   4. Walking between rooms   5. Putting on socks/shoes   6. Squatting    7. Lifting an object, like a bag of groceries from the floor   8. Performing light activities around your home   9. Performing heavy  activities around your home   10. Getting into/out of a car   11. Walking 2 blocks   12. Walking 1 mile   13. Going up/down 10 stairs (1 flight)   14. Standing for 1 hour   15.  sitting for 1 hour   16. Running on even ground   17. Running on uneven ground   18. Making sharp turns while running fast   19. Hopping    20. Rolling over in bed   Score total:  ***     COGNITION: Overall cognitive status: {cognition:24006}    SENSATION: {sensation:27233}  EDEMA:  {edema:24020}  POSTURE:  {posture:25561}  PALPATION: ***  MUSCLE LENGTH: Hamstrings: Right *** deg; Left *** deg Debby test: Right *** deg; Left *** deg Hamstrings: *** ITB: *** Piriformis: *** Hip flexors: *** Quads: *** Heelcord: ***  LOWER EXTREMITY ROM:  Active ROM Right eval Left eval  Hip flexion    Hip extension    Hip abduction    Hip adduction    Hip internal rotation    Hip external rotation    Knee flexion    Knee extension    Ankle dorsiflexion    Ankle plantarflexion    Ankle inversion    Ankle eversion     Passive ROM Right eval Left eval  Hip flexion    Hip extension    Hip abduction    Hip adduction    Hip internal rotation    Hip external rotation    Knee flexion    Knee extension    Ankle dorsiflexion    Ankle plantarflexion    Ankle inversion    Ankle eversion    (Blank rows = not tested)  LOWER EXTREMITY MMT:  MMT Right eval Left eval  Hip flexion    Hip extension    Hip  abduction    Hip adduction    Hip internal rotation    Hip external rotation    Knee flexion    Knee extension    Ankle dorsiflexion    Ankle plantarflexion    Ankle inversion    Ankle eversion     (Blank rows = not tested)  LOWER EXTREMITY SPECIAL TESTS:  {LEspecialtests:26242}  FUNCTIONAL TESTS:  {Functional tests:24029}  GAIT: Distance walked: *** Assistive device utilized: {Assistive devices:23999} Level of assistance: {Levels of assistance:24026} Gait pattern: {gait  characteristics:25376} Comments: ***   TODAY'S TREATMENT:   SELF CARE: Provided education on PT POC progression.initial HEP    PATIENT EDUCATION:  Education details: PT eval findings, anticipated POC, and initial HEP  Person educated: Patient Education method: Explanation, Demonstration, Verbal cues, Tactile cues, Handouts, and [employer] app access provided Education comprehension: verbalized understanding, verbal cues required, tactile cues required, and needs further education  HOME EXERCISE PROGRAM: ***   ASSESSMENT:  CLINICAL IMPRESSION: Vansh Reckart. is a 88 y.o. male who was referred to physical therapy for evaluation and treatment for ***.  ***   Patient reports onset of *** pain beginning ***. Pain is worse with ***.  Patient has deficits in *** ROM, *** LE flexibility, *** strength, ***abnormal posture, and TTP with abnormal muscle tension *** which are interfering with ADLs and are impacting quality of life.  On LEFS patient scored ***/80 demonstrating *** functional limitation.  Kharter Brew will benefit from skilled PT to address above deficits to improve mobility and activity tolerance with decreased pain interference.  OBJECTIVE IMPAIRMENTS: {opptimpairments:25111}.   ACTIVITY LIMITATIONS: {activitylimitations:27494}  PARTICIPATION LIMITATIONS: {participationrestrictions:25113}  PERSONAL FACTORS: Age and 1-2 comorbidities: Multi joint OA,  B TSAs, L THA, Alzheimer's, CVA, CAD are also affecting patient's functional outcome.   REHAB POTENTIAL: Good  CLINICAL DECISION MAKING: Evolving/moderate complexity  EVALUATION COMPLEXITY: Moderate   GOALS: Goals reviewed with patient? Yes  SHORT TERM GOALS: Target date: ***  Patient will be independent with initial HEP. Baseline: 100% PT assist required for correct completion Goal status: INITIAL  2.  Patient will report at least 25% improvement in L hip pain to improve QOL. Baseline: *** Goal status:  INITIAL  3.  *** Baseline: *** Goal status: {GOALSTATUS:25110}  LONG TERM GOALS: Target date: ***  Patient will be independent with advanced/ongoing HEP to improve outcomes and carryover.  Baseline: no advanced HEP yet Goal status: INITIAL  2.  Patient will report at least 50-75% improvement in L hip pain to improve QOL. Baseline: *** Goal status: INITIAL  3.  Patient will demonstrate improved LLE strength to >/= 5/5 for improved stability and ease of mobility. Baseline: Refer to above LE MMT table Goal status: INITIAL  4.  Patient will be able to ambulate 600' with LRAD and normal gait pattern without increased pain to access community.  Baseline: *** Goal status: INITIAL  5.  Patient will report >/= ***/80 on LEFS (MCID = 9 pts) to demonstrate improved functional ability. Baseline: *** Goal status: INITIAL  6.  Patient will demonstrate at least 19/24 on DGI to decrease risk of falls. Baseline: *** Goal status: INITIAL    PLAN:  PT FREQUENCY: 1-2x/week  PT DURATION: 8 weeks  PLANNED INTERVENTIONS: 97110-Therapeutic exercises, 97530- Therapeutic activity, [id number]- Neuromuscular re-education, 97535- Self Care, 02859- Manual therapy, [id number]- Electrical stimulation (unattended), 20560 (1-2 muscles), 20561 (3+ muscles)- Dry Needling, Patient/Family education, Balance training, Stair training, Taping, Cryotherapy, and Moist heat  PLAN FOR NEXT SESSION: ***  Advait Buice, PT 10/09/2024, 8:51 PM

## 2024-10-12 ENCOUNTER — Ambulatory Visit: Admitting: Rehabilitation

## 2024-10-19 ENCOUNTER — Encounter (HOSPITAL_BASED_OUTPATIENT_CLINIC_OR_DEPARTMENT_OTHER): Payer: Self-pay

## 2024-10-19 ENCOUNTER — Other Ambulatory Visit: Payer: Self-pay

## 2024-10-19 ENCOUNTER — Emergency Department (HOSPITAL_BASED_OUTPATIENT_CLINIC_OR_DEPARTMENT_OTHER)
Admission: EM | Admit: 2024-10-19 | Discharge: 2024-10-19 | Disposition: A | Discharge status: Home / Self Care | Attending: Emergency Medicine | Admitting: Emergency Medicine

## 2024-10-19 ENCOUNTER — Emergency Department (HOSPITAL_BASED_OUTPATIENT_CLINIC_OR_DEPARTMENT_OTHER)

## 2024-10-19 DIAGNOSIS — R0602 Shortness of breath: Secondary | ICD-10-CM | POA: Insufficient documentation

## 2024-10-19 DIAGNOSIS — Z8673 Personal history of transient ischemic attack (TIA), and cerebral infarction without residual deficits: Secondary | ICD-10-CM | POA: Diagnosis not present

## 2024-10-19 DIAGNOSIS — Z7982 Long term (current) use of aspirin: Secondary | ICD-10-CM | POA: Insufficient documentation

## 2024-10-19 DIAGNOSIS — R0600 Dyspnea, unspecified: Secondary | ICD-10-CM | POA: Insufficient documentation

## 2024-10-19 DIAGNOSIS — R7989 Other specified abnormal findings of blood chemistry: Secondary | ICD-10-CM | POA: Insufficient documentation

## 2024-10-19 DIAGNOSIS — I10 Essential (primary) hypertension: Secondary | ICD-10-CM | POA: Insufficient documentation

## 2024-10-19 DIAGNOSIS — Z7901 Long term (current) use of anticoagulants: Secondary | ICD-10-CM | POA: Diagnosis not present

## 2024-10-19 LAB — BASIC METABOLIC PANEL WITH GFR
Anion gap: 12 (ref 5–15)
BUN: 15 mg/dL (ref 8–23)
CO2: 27 mmol/L (ref 22–32)
Calcium: 9.4 mg/dL (ref 8.9–10.3)
Chloride: 103 mmol/L (ref 98–111)
Creatinine, Ser: 0.85 mg/dL (ref 0.61–1.24)
GFR, Estimated: >60 mL/min
Glucose, Bld: 138 mg/dL — ABNORMAL HIGH (ref 70–99)
Potassium: 3.7 mmol/L (ref 3.5–5.1)
Sodium: 143 mmol/L (ref 135–145)

## 2024-10-19 LAB — CBC
HCT: 38.8 % — ABNORMAL LOW (ref 39.0–52.0)
Hemoglobin: 12.7 g/dL — ABNORMAL LOW (ref 13.0–17.0)
MCH: 32 pg (ref 26.0–34.0)
MCHC: 32.7 g/dL (ref 30.0–36.0)
MCV: 97.7 fL (ref 80.0–100.0)
Platelets: 204 K/uL (ref 150–400)
RBC: 3.97 MIL/uL — ABNORMAL LOW (ref 4.22–5.81)
RDW: 13.9 % (ref 11.5–15.5)
WBC: 6.4 K/uL (ref 4.0–10.5)
nRBC: 0 % (ref 0.0–0.2)

## 2024-10-19 LAB — RESP PANEL BY RT-PCR (RSV, FLU A&B, COVID)  RVPGX2
Influenza A by PCR: NEGATIVE
Influenza B by PCR: NEGATIVE
Resp Syncytial Virus by PCR: NEGATIVE
SARS Coronavirus 2 by RT PCR: NEGATIVE

## 2024-10-19 LAB — TROPONIN T, HIGH SENSITIVITY
Troponin T High Sensitivity: 75 ng/L — ABNORMAL HIGH (ref 0–19)
Troponin T High Sensitivity: 62 ng/L — ABNORMAL HIGH (ref 0–19)

## 2024-10-19 LAB — PRO BRAIN NATRIURETIC PEPTIDE: Pro Brain Natriuretic Peptide: 135 pg/mL

## 2024-10-19 LAB — D-DIMER, QUANTITATIVE: D-Dimer, Quant: 0.28 ug{FEU}/mL (ref 0.00–0.50)

## 2024-10-19 NOTE — ED Notes (Signed)
 Lab notified of troponin, pro-BNP, and D-dimer add-ons.

## 2024-10-19 NOTE — ED Triage Notes (Addendum)
 Pt states that he started having some shortness of breath this morning. States that 3 weeks ago he had a upper respiratory infection. Denies pain at this time.   Pt states that he feels like the breath in his going in his nose but not going into his lungs.

## 2024-10-19 NOTE — ED Notes (Signed)
 RT assessed in waiting room. Patient stated he had been SOB since this AM when he woke up. Positive for cough. SAT 97%, HR 62. Patient waiting on triage.

## 2024-10-19 NOTE — Discharge Instructions (Addendum)
 The test today in the ED did not show any signs of pneumonia, heart failure, blood clot or other serious abnormality.  It is possible the symptoms could be related to the sleep apnea.  The heart enzymes were slightly elevated.  Follow-up with Dr. Verlin to discuss any need for further cardiac evaluation.  Follow up with ENT as planned and your neurologist who manages your sleep apnea to further evaluate the cause of these shortness of breath episodes.

## 2024-10-19 NOTE — ED Notes (Signed)
 Patient transported to X-ray

## 2024-10-19 NOTE — ED Notes (Signed)
 Delta Trop sent to lab

## 2024-10-19 NOTE — ED Provider Notes (Signed)
 " [place] [facility] Provider Note   CSN: [id number] Arrival date & time: 10/19/24  1014     Patient presents with: Shortness of Breath   Jose [name]. is a 88 y.o. male.    Shortness of Breath    Patient has a history of hypertension seizures, TIA, neuropathy, sleep apnea.  Patient states he has been having difficulty with his breathing ongoing for a while.  At times he will wake up at night feeling like he cannot catch his breath.  He supposed to be on CPAP sometimes is not able to tolerate it ponder other times he can.  Patient was actually scheduled to see his ENT doctor for this issue.  Patient however states he has been feeling worse in the last few days.  He was feeling short of breath this morning and does not feel like he is getting oxygen completely into his lungs.  He has been coughing recently and his wife had a respiratory infection  Prior to Admission medications  Medication Sig Start Date End Date Taking? Authorizing Provider  acetaminophen  (TYLENOL ) 500 MG tablet Take 500 mg by mouth 3 (three) times daily.    [provider]  Aflibercept (EYLEA IZ) 1 Dose by Intravitreal route See admin instructions. 1 injection into each eye every 8-10 weeks.    [provider]  amLODipine  (NORVASC ) 5 MG tablet Take 5 mg by mouth daily. 06/08/19   [provider]  amoxicillin  (AMOXIL ) 500 MG capsule Take 4 capsules (2,000 mg total) by mouth 1 hour prior to dental visit. 03/25/24     apixaban  (ELIQUIS ) 5 MG TABS tablet Take 1 tablet (5 mg total) by mouth 2 (two) times daily. 06/19/24   Verlin Lonni BIRCH, MD  ASCORBIC ACID PO Take 1 tablet by mouth daily. Vitamin C, unknown strength.    [provider]  aspirin  EC 81 MG tablet Take 81 mg by mouth daily.    [provider]  atorvastatin  (LIPITOR ) 40 MG tablet Take 1 tablet (40 mg total) by mouth daily. 05/21/24     azelastine (ASTELIN) 0.1 % nasal  spray Place 1 spray into both nostrils daily. Use in each nostril as directed    [provider]  Bromfenac  Sodium (PROLENSA ) 0.07 % SOLN Place 1 drop into the left eye 3 (three) times daily for 3 days then stop 03/27/23     Clobetasol Propionate 0.05 % shampoo Apply 1 application  topically See admin instructions. Apply topically to scalp 2-3 times a week as needed for severe dry skin.    [provider]  COCONUT OIL PO Take 1 capsule by mouth daily with lunch.    [provider]  diphenhydrAMINE  [facility] RELIEF) 2 % cream Apply topically 2 (two) times a day if needed for itching for up to 14 days. 03/05/24     donepezil  (ARICEPT ) 10 MG tablet Take 1 tablet (10 mg total) by mouth at bedtime. 07/30/24   Lomax, Amy, NP  erythromycin  ophthalmic ointment Place a small amount into the left eye 3 (three) times daily. 04/25/23     fluticasone  (FLONASE ) 50 MCG/ACT nasal spray Place 2 sprays into both nostrils daily. 08/29/22     guaiFENesin  (ROBITUSSIN) 100 MG/5ML liquid Take 5-10 mLs (100-200 mg total) by mouth every 4 (four) hours as needed for cough or to loosen phlegm. 10/07/24   Melvenia Motto, MD  isosorbide  mononitrate (IMDUR ) 30 MG 24 hr tablet Take 30 mg by  mouth daily.    [provider]  labetalol  (NORMODYNE ) 100 MG tablet Take 0.5 tablets (50 mg total) by mouth 2 (two) times daily. 01/11/23   Verlin Lonni BIRCH, MD  memantine  (NAMENDA ) 10 MG tablet Take 1 tablet (10 mg total) by mouth 2 (two) times daily. 07/30/24   Lomax, Amy, NP  methylPREDNISolone  (MEDROL ) 4 MG TBPK tablet Use as directed. 09/30/24     Multiple Vitamins-Minerals (PRESERVISION AREDS 2) CAPS Take 1 capsule by mouth 2 (two) times daily.     [provider]  nitroGLYCERIN  (NITROSTAT ) 0.4 MG SL tablet PLACE 1 TABLET (0.4 MG TOTAL) UNDER THE TONGUE EVERY 5 (FIVE) MINUTES X 3 DOSES AS NEEDED FOR CHEST PAIN. 03/03/21   Verlin Lonni BIRCH, MD  Polyethyl Glycol-Propyl Glycol (SYSTANE OP) Place 1  drop into both eyes 4 (four) times daily as needed (dry eyes, irritated eyes.).    [provider]  ROCKLATAN  0.02-0.005 % SOLN Place 1 drop into the right eye at bedtime. 04/10/19   [provider]  tolterodine (DETROL LA) 2 MG 24 hr capsule Take 2 mg by mouth daily.    [provider]    Allergies: Maprotiline and Sumycin [tetracycline]    Review of Systems  Respiratory:  Positive for shortness of breath.     Updated Vital Signs BP (!) 142/79   Pulse (!) 58   Temp 97.7 F (36.5 C) (Oral)   Resp 16   Ht 1.829 m (6')   Wt 99.8 kg   SpO2 98%   BMI 29.84 kg/m   Physical Exam Vitals and nursing note reviewed.  Constitutional:      General: He is not in acute distress.    Appearance: He is well-developed.  HENT:     Head: Normocephalic and atraumatic.     Right Ear: External ear normal.     Left Ear: External ear normal.  Eyes:     General: No scleral icterus.       Right eye: No discharge.        Left eye: No discharge.     Conjunctiva/sclera: Conjunctivae normal.  Neck:     Trachea: No tracheal deviation.  Cardiovascular:     Rate and Rhythm: Normal rate and regular rhythm.  Pulmonary:     Effort: Pulmonary effort is normal. No respiratory distress.     Breath sounds: Normal breath sounds. No stridor. No wheezing or rales.  Abdominal:     General: Bowel sounds are normal. There is no distension.     Palpations: Abdomen is soft.     Tenderness: There is no abdominal tenderness. There is no guarding or rebound.  Musculoskeletal:        General: No tenderness or deformity.     Cervical back: Neck supple.  Skin:    General: Skin is warm and dry.     Findings: No rash.  Neurological:     General: No focal deficit present.     Mental Status: He is alert.     Cranial Nerves: No cranial nerve deficit, dysarthria or facial asymmetry.     Sensory: No sensory deficit.     Motor: No abnormal muscle tone or seizure activity.     Coordination:  Coordination normal.  Psychiatric:        Mood and Affect: Mood normal.     (all labs ordered are listed, but only abnormal results are displayed) Labs Reviewed  BASIC METABOLIC PANEL WITH GFR - Abnormal; Notable for the  following components:      Result Value   Glucose, Bld 138 (*)    All other components within normal limits  CBC - Abnormal; Notable for the following components:   RBC 3.97 (*)    Hemoglobin 12.7 (*)    HCT 38.8 (*)    All other components within normal limits  TROPONIN T, HIGH SENSITIVITY - Abnormal; Notable for the following components:   Troponin T High Sensitivity 75 (*)    All other components within normal limits  TROPONIN T, HIGH SENSITIVITY - Abnormal; Notable for the following components:   Troponin T High Sensitivity 62 (*)    All other components within normal limits  RESP PANEL BY RT-PCR (RSV, FLU A&B, COVID)  [id number]  D-DIMER, QUANTITATIVE  PRO BRAIN NATRIURETIC PEPTIDE    EKG: EKG Interpretation Date/Time:  Monday October 19 2024 10:25:54 EST Ventricular Rate:  65 PR Interval:  225 QRS Duration:  96 QT Interval:  447 QTC Calculation: 465 R Axis:   -44  Text Interpretation: Sinus rhythm Prolonged PR interval Left anterior fascicular block Abnormal R-wave progression, early transition Left ventricular hypertrophy Anterior Q waves, possibly due to LVH Nonspecific T abnormalities, lateral leads Confirmed by Randol Simmonds [phone]) on 10/19/2024 11:02:47 AM  Radiology: [facility] Chest 2 View Result Date: 10/19/2024 EXAM: 2 VIEW(S) XRAY OF THE CHEST 10/19/2024 11:21:43 AM COMPARISON: 10/07/2024 CLINICAL HISTORY: shortness of breath FINDINGS: LUNGS AND PLEURA: No focal pulmonary opacity. No pleural effusion. No pneumothorax. HEART AND MEDIASTINUM: Unchanged cardiomegaly. BONES AND SOFT TISSUES: Cervical fusion hardware. Bilateral shoulder arthroplasty. No acute osseous abnormality. IMPRESSION: 1. No acute findings. Electronically signed by: Ryan Chess MD  10/19/2024 11:56 AM EST RP Workstation: [id number]     Procedures   Medications Ordered in the ED - No data to display  Clinical Course as of 10/19/24 1415  Mon Oct 19, 2024  1201 Troponin T, High Sensitivity(!) Troponin slightly increased at 75.  D-dimer negative.  proBNP negative [JK]  1201 Basic metabolic panel(!) Metabolic panel normal [JK]  1201 Chest x-ray negative [JK]  1348 Second troponin decreased [JK]    Clinical Course User Index [JK] Randol Simmonds, MD                                 Medical Decision Making Problems Addressed: Dyspnea, unspecified type: acute illness or injury that poses a threat to life or bodily functions Elevated troponin: undiagnosed new problem with uncertain prognosis  Amount and/or Complexity of Data Reviewed Labs: ordered. Decision-making details documented in ED Course. Radiology: ordered and independent interpretation performed.   Patient presented to the ED for evaluation of episodes of shortness of breath.  Symptoms have been going on for at least several weeks now.  Patient at times feels like cannot breathe.  No fevers.  No leg swelling no chest pain.  Consider the possibility of pulmonary embolism but his D-dimer was negative.  I doubt PE  Considered pneumonia pneumothorax but x-ray is unremarkable.  Considered congestive heart failure but BNP is unremarkable and his chest x-ray does not show pulmonary edema.  Troponin is slightly elevated however delta troponin is decreased.  I doubt acute coronary syndrome.  He is not having chest pain.  Does not have any acute ischemic changes on EKG.  I do think he would benefit from further cardiac evaluation but I think he is safe to follow-up with his cardiologist Dr. Verlin.  Patient is  scheduled to see an ENT doctor.  Think it is reasonable to continue without evaluation.  Also think he can follow-up with his neurologist who manages his sleep apnea to make sure there are not issues with his  CPAP device causing some discomfort and some of his breathing difficulties at night.     Final diagnoses:  Dyspnea, unspecified type  Elevated troponin    ED Discharge Orders          Ordered    Ambulatory referral to Cardiology       Comments: If you have not heard from the Cardiology office within the next 72 hours please call [phone].   10/19/24 1411               Randol Simmonds, MD 10/19/24 1415  "

## 2024-10-20 ENCOUNTER — Other Ambulatory Visit (HOSPITAL_BASED_OUTPATIENT_CLINIC_OR_DEPARTMENT_OTHER): Payer: Self-pay

## 2024-10-20 MED ORDER — MUPIROCIN 2 % EX OINT
1.0000 | TOPICAL_OINTMENT | Freq: Two times a day (BID) | CUTANEOUS | 0 refills | Status: AC
  Filled 2024-10-20: qty 22, 11d supply, fill #0

## 2024-10-21 ENCOUNTER — Other Ambulatory Visit (HOSPITAL_BASED_OUTPATIENT_CLINIC_OR_DEPARTMENT_OTHER): Payer: Self-pay

## 2024-10-28 ENCOUNTER — Other Ambulatory Visit (HOSPITAL_BASED_OUTPATIENT_CLINIC_OR_DEPARTMENT_OTHER): Payer: Self-pay

## 2024-10-28 MED ORDER — CEPHALEXIN 500 MG PO CAPS
500.0000 mg | ORAL_CAPSULE | Freq: Four times a day (QID) | ORAL | 0 refills | Status: AC
  Filled 2024-10-28: qty 28, 7d supply, fill #0

## 2024-11-10 ENCOUNTER — Other Ambulatory Visit: Payer: Self-pay

## 2024-11-10 ENCOUNTER — Ambulatory Visit: Attending: Orthopedic Surgery

## 2024-11-10 DIAGNOSIS — M25552 Pain in left hip: Secondary | ICD-10-CM | POA: Diagnosis present

## 2024-11-10 DIAGNOSIS — M6281 Muscle weakness (generalized): Secondary | ICD-10-CM | POA: Insufficient documentation

## 2024-11-10 NOTE — Therapy (Unsigned)
 " OUTPATIENT PHYSICAL THERAPY LOWER EXTREMITY EVALUATION   Patient Name: Jose [name]. MRN: [id number] DOB:Jan 03, 1936, 89 y.o., male Today's Date: 11/11/2024  END OF SESSION:  PT End of Session - 11/10/24 1334     Visit Number 1    Date for Recertification  01/05/25    PT Start Time 1318    PT Stop Time 1400    PT Time Calculation (min) 42 min    Activity Tolerance Patient tolerated treatment well    Behavior During Therapy WFL for tasks assessed/performed          Past Medical History:  Diagnosis Date   Arthritis    CAD (coronary artery disease)    04/15/18 65% dLAD, 20% pLAD, 30% 2nd diag, Normal EF   Dysrhythmia    afib   Gallstones 07/1996   Headache    couple times a year   History of CT scan of head 1997   small vessel disease   History of ETT 05/2002    no EKG changes   History of MRI of cervical spine 07/15/2002   Hypertension    Lumbar spondylolysis 12/17/2016   MGUS (monoclonal gammopathy of unknown significance) 04/11/2012   Occipital neuralgia of left side 12/09/2018   Peripheral neuropathy    Pneumonia 10/2014   PONV (postoperative nausea and vomiting)    once many years ago   Primary localized osteoarthritis of left hip 07/08/2018   Primary localized osteoarthrosis of left shoulder 12/25/2016   Primary localized osteoarthrosis of right shoulder 11/05/2017   Rectal bleeding 12/2002   colonoscopy   Seizures (HCC)    last seizure 1976   Skin cancer    Sleep apnea    uses CPAP   Spinal stenosis, lumbar region with neurogenic claudication    Stroke [facility])    ?tia ?afib   TIA (transient ischemic attack)    Ulnar neuropathy at elbow 12/24/2014   Left   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 extremity 03/29/2015   Past Surgical History:  Procedure Laterality Date   ANTERIOR CERVICAL DECOMP/DISCECTOMY FUSION N/A 02/04/2015   Procedure: ANTERIOR CERVICAL DECOMPRESSION/DISCECTOMY FUSION CERVICAL FIVE-SIX,CERVICAL SIX-SEVEN;  Surgeon: Darina Boehringer, MD;   Location: [facility] NEURO ORS;  Service: Neurosurgery;  Laterality: N/A;   arthroscopic knee surgery     BACK SURGERY     CATARACT EXTRACTION Bilateral    COLONOSCOPY W/ POLYPECTOMY  04/19/2003   tubular adenoma   EYE SURGERY     Bilateral Cataracts   GREAT TOE ARTHRODESIS, INTERPHALANGEAL JOINT Left 1978   JOINT REPLACEMENT     joint replacement on right thumb Right    LEFT HEART CATH AND CORONARY ANGIOGRAPHY N/A 04/15/2018   Procedure: LEFT HEART CATH AND CORONARY ANGIOGRAPHY;  Surgeon: Burnard Debby LABOR, MD;  Location: [facility] INVASIVE CV LAB;  Service: Cardiovascular;  Laterality: N/A;   LUMBAR LAMINECTOMY/DECOMPRESSION MICRODISCECTOMY N/A 07/19/2017   Procedure: LAMINECTOMY AND FORAMINOTOMY  LUMBAR TWO- LUMBAR THREE, LUMBAR THREE- LUMBAR FOUR;  Surgeon: Gillie Duncans, MD;  Location: [facility] OR;  Service: Neurosurgery;  Laterality: N/A;   nerve transfer surgery to lwft hand Left    Nov. 2017   SHOULDER ARTHROSCOPY W/ ROTATOR CUFF REPAIR Left 2013   rotator cuff    SHOULDER INJECTION  03/2011   left, [facility]   SKIN CANCER EXCISION  07/1993   left lateral arm   SUPERFICIAL KERATECTOMY Right 05/1998   wrist   thumb injection  03/2011   left, [facility]   TOE SURGERY Right  TONSILLECTOMY AND ADENOIDECTOMY     TOTAL HIP ARTHROPLASTY Left 07/08/2018   TOTAL HIP ARTHROPLASTY Left 07/08/2018   Procedure: LEFT TOTAL HIP ARTHROPLASTY;  Surgeon: Josefina Chew, MD;  Location: [facility] OR;  Service: Orthopedics;  Laterality: Left;   TOTAL SHOULDER ARTHROPLASTY Left 12/25/2016   Procedure: TOTAL SHOULDER ARTHROPLASTY;  Surgeon: Chew Josefina, MD;  Location: [facility] OR;  Service: Orthopedics;  Laterality: Left;   TOTAL SHOULDER ARTHROPLASTY Right 11/05/2017   TOTAL SHOULDER ARTHROPLASTY Right 11/05/2017   Procedure: TOTAL SHOULDER ARTHROPLASTY;  Surgeon: Josefina Chew, MD;  Location: [facility] OR;  Service: Orthopedics;  Laterality: Right;   ulnar nerve transfer  12/1999   left   ULNAR NERVE TRANSPOSITION Left    Patient  Active Problem List   Diagnosis Date Noted   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   Nasal septal deviation 05/30/2023   Globus sensation 05/30/2023   Late onset Alzheimer disease (HCC) 04/16/2022   Laryngopharyngeal reflux 03/28/2022   Epistaxis 03/28/2022   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   Overactive bladder 09/14/2021   Chronic anticoagulation 09/14/2021   Memory loss 04/18/2021   Slow transit constipation 02/15/2021   Obstructive sleep apnea 09/22/2019   Occipital neuralgia of left side 12/09/2018   Primary localized osteoarthritis of left hip 07/08/2018   Primary localized osteoarthritis of hip 07/08/2018   Atherosclerosis of native coronary artery of native heart with angina pectoris 04/16/2018   Primary localized osteoarthrosis of right shoulder 11/05/2017   Primary localized osteoarthrosis of shoulder 11/05/2017   Vertigo 10/30/2017   Lumbar stenosis with neurogenic claudication 07/19/2017   Primary localized osteoarthrosis of left shoulder 12/25/2016   S/P shoulder replacement 12/25/2016   Lumbar spondylolysis 12/17/2016   Paresthesia 01/24/2016   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 extremity 03/29/2015   Cervical spinal stenosis 02/04/2015   Bronchiectasis without acute exacerbation (HCC) 01/11/2015   TIA (transient ischemic attack) 12/15/2014   Hyperlipidemia 11/24/2014   PAF (paroxysmal atrial fibrillation) (HCC)    MGUS (monoclonal gammopathy of unknown significance) 04/11/2012   Gout, unspecified 10/04/2009   BPH (benign prostatic hyperplasia) 12/14/2008   ALLERGIC CONJUNCTIVITIS 12/03/2007   TESTOSTERONE  DEFICIENCY 11/14/2007   Thoracic back pain 04/22/2007   OBESITY, NOS 12/26/2006   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   Peripheral neuropathy 12/26/2006   Hearing loss 12/26/2006   HYPERTENSION, BENIGN SYSTEMIC 12/26/2006   OSTEOARTHRITIS, MULTI SITES 12/26/2006   Musculoskeletal disorder and symptoms referable to neck 12/26/2006   Hereditary and idiopathic  peripheral neuropathy 12/26/2006    PCP: Lonni Loots, PA  REFERRING PROVIDER: Josefina Chew, MD  REFERRING DIAG: L lateral hip pain  THERAPY DIAG:  Pain in left hip  Muscle weakness (generalized)  Rationale for Evaluation and Treatment: Rehabilitation  ONSET DATE: chronic  SUBJECTIVE:   SUBJECTIVE STATEMENT: Pt attended with his wife who provides some history  PERTINENT HISTORY: Referred by Dr. Josefina due to L hip pain and weakness PAIN:  Are you having pain? Pt reports constant pain L post hip with some L ant hip pain , worse with initial standing and initial walking PRECAUTIONS: Other: pt diagnosed with alzhiemers   RED FLAGS: None   WEIGHT BEARING RESTRICTIONS: No  FALLS:  Has patient fallen in last 6 months? No  LIVING ENVIRONMENT: Lives with: lives with their spouse Lives in: House/apartment Stairs: one step  Has following equipment at home: Environmental Consultant - 2 wheeled  OCCUPATION: retired  PLOF: Independent with basic ADLs  PATIENT GOALS: pt and wife want pain relief, usually get injections but unable to get another  one today  NEXT MD VISIT: not scheduled  OBJECTIVE:  Note: Objective measures were completed at Evaluation unless otherwise noted.  DIAGNOSTIC FINDINGS: na, does have previous hip replacement L  PATIENT SURVEYS:  Harris Hip score: 81.00 %  COGNITION: Overall cognitive status: History of cognitive impairments - at baseline     SENSATION: WFL  EDEMA:  none  MUSCLE LENGTH:  Hamstrings: Right -20 deg; Left -20 deg Thomas test: Right wfl deg; Left -20 deg  POSTURE: in standing with walker marked R   PALPATION: Sore/ tender with palpation L piriformis, gluteus maximus  LOWER EXTREMITY ROM: L hip ROM wnl   LOWER EXTREMITY MMT:  MMT Right eval Left eval  Hip flexion  4+  Hip extension  4-  Hip abduction  4+  Hip adduction    Hip internal rotation    Hip external rotation    Knee flexion    Knee extension 4   Ankle  dorsiflexion    Ankle plantarflexion    Ankle inversion    Ankle eversion     (Blank rows =wfl)  LOWER EXTREMITY SPECIAL TESTS:  na  FUNCTIONAL TESTS:  Timed up and go (TUG): without device, 14.72  GAIT: Distance walked: in clinic x 75' Assistive device utilized: Environmental Consultant - 2 wheeled Level of assistance: SBA Comments: pronounced varum R ,flexed posture through B hips, shortened stride length B                                                                                                                                TREATMENT DATE: 11/11/24:  Evaluation, education with pt and wife regarding findings, and in home program to stretch L ant hip and strengthen L gluteals  Theragun massage L gluteals    PATIENT EDUCATION:  Education details: POC, goals Person educated: Patient Education method: Explanation Education comprehension: verbalized understanding, returned demonstration, and verbal cues required  HOME EXERCISE PROGRAM: Access Code: [email or web address] URL: [email or web address] Date: 11/10/2024 Prepared by: Greig Credit  Exercises - Beginner Bridge  - 2 x daily - 7 x weekly - 1 sets - 10 reps - Hip Flexor Stretch at Edge of Bed  - 2 x daily - 7 x weekly - 1 sets - 10 reps ASSESSMENT:  CLINICAL IMPRESSION: Patient is a 89 y.o. male who was evaluated today by physical therapy due to L lateral hip pain. Findings are consistent for L anterior hip and quads tightness as well as L gluteus maximus, hip extensor weakness, and some soft tissue/ muscular pain, thickening in L gluteals.  He should improve with specific stretching, strengthening and with manual techniques.  His wife is his caregiver due to his Alzheimer's diagnosis, and she requests trial of the ex at home initially due to difficulty in getting him to his appointments.  We will leave his chart open for 60 days if she and he feel the need for more hands on instruction  OBJECTIVE IMPAIRMENTS: decreased activity  tolerance, decreased cognition, decreased endurance, decreased knowledge of condition, decreased mobility, difficulty walking, decreased strength, increased fascial restrictions, and pain.   ACTIVITY LIMITATIONS: carrying, lifting, standing, squatting, stairs, transfers, and locomotion level  PARTICIPATION LIMITATIONS: shopping and community activity  PERSONAL FACTORS: Age, Behavior pattern, Education, Past/current experiences, Time since onset of injury/illness/exacerbation, and 1-2 comorbidities: alzheimers, h/o L hip replacement,  are also affecting patient's functional outcome.   REHAB POTENTIAL: Good  CLINICAL DECISION MAKING: Evolving/moderate complexity  EVALUATION COMPLEXITY: Moderate   GOALS: Goals reviewed with patient? Yes  SHORT TERM GOALS: Target date: 1 month HEP I with wife's assistance Baseline: Goal status: INITIAL   LONG TERM GOALS: Target date: 01/05/25, 8 weeks  Harris Hip score 91% improved from 81% Baseline:  Goal status: INITIAL  2.  Improve L ant hip flexibility from -20 to -10 degrees Baseline:  Goal status: INITIAL  3.  Improve L gluteal strength to 4+/5 Baseline: 4- Goal status: INITIAL  PLAN:  PT FREQUENCY: as of today one time visit, if pt needs to come back in would recommend 2 x weekly  PT DURATION: 8 weeks  PLANNED INTERVENTIONS: 97110-Therapeutic exercises, 97530- Therapeutic activity, [id number]- Neuromuscular re-education, 97535- Self Care, [id number]- Manual therapy, and Patient/Family education  PLAN FOR NEXT SESSION: assess HEP, manual techniques L post hip   Leesa Leifheit L Ella Golomb, PT, DPT< OCS 11/11/2024, 4:49 PM  "

## 2024-11-16 NOTE — Progress Notes (Unsigned)
 " Cardiology Office Note   Date:  11/17/2024  ID:  Jose Wild., DOB 04/01/1936, MRN [id number] PCP: Duwayne Bruckner, PA-C   HeartCare Providers Cardiologist:  Bruckner Cash, MD    History of Present Illness Jose Locy. is a 89 y.o. male with history of HTN, HLD, paroxysmal atrial fibrillation, TIA, MGUS and CAD here today for cardiac follow up. He had been followed by Dr. Launie in [facility] until June 2019. He was admitted to [facility] in June 2019 with fluttering in his chest with associated chest pain. Troponin was negative. Nuclear stress test with possible ischemia. Cardiac cath June 2019 with mild to moderate non-obstructive disease in the LAD, mild disease in the RCA. Echo 04/15/18 with LVEF=60-65%. No significant valve disease. He was discharged on Imdur . He is on Eliquis  for paroxysmal atrial fibrillation. Coronary CTA July 2021 with mild non-obstructive disease in all three major vessels. He was admitted to [facility] on 01/01/23 with left sided weakness and unsteady gait. Head MRI and CT without acute stroke. Echo 01/02/23 with LVEF=55%. No valve disease. His labetalol  and Losartan  were stopped. Neck CTA with minimal bilateral carotid artery plaque. Losartan  was stopped due to hypotension.    When he was last seen 06/2023, he was doing well and asymptomatic from a cardiac standpoint. He was needing clearance for an upcoming hemorrhoid surgery.  Today, he presents with a hx of nonobstructive coronary artery disease with chest discomfort. He was referred for follow-up after elevated cardiac enzymes were noted in the ER.  He describes a constant achy feeling on the left side of his chest since yesterday. The discomfort is distinct from the right side, nonradiating, and does not change with activity or food. He denies numbness or classic myocardial infarction symptoms. About a week ago he had a similar intermittent pain that resolved before this current, more persistent  episode.  He has nonobstructive coronary artery disease with a coronary CT in 2021 that showed a calcium  score of 1143. He has not had stents. During a recent ER visit for this chest discomfort, cardiac enzymes were mildly elevated.  He was hospitalized twice around Christmas for a severe upper respiratory illness. He denies current respiratory symptoms.  He takes cholesterol medication, with cholesterol reported normal in October 2025. His blood sugar was about 200 and he was told he is prediabetic, and he has reduced dietary sugar.  His son's ex-wife died suddenly from an aortic dissection, which has increased his concern about cardiac problems.  He is retired and lives with his wife. He has not done recent strenuous chest exercise. He denies irregular heartbeats, palpitations, or atrial fibrillation symptoms  Reports no shortness of breath nor dyspnea on exertion. Reports no chest pain, pressure, or tightness. No edema, orthopnea, PND. Reports no palpitations.   Discussed the use of AI scribe software for clinical note transcription with the patient, who gave verbal consent to proceed.  ROS: pertinent ROD in HPI  Studies Reviewed     Echo 01/02/23 IMPRESSIONS     1. Left ventricular ejection fraction, by estimation, is 55%. The left  ventricle has low normal function. The left ventricle has no regional wall  motion abnormalities. Left ventricular diastolic parameters are consistent  with Grade I diastolic  dysfunction (impaired relaxation).   2. Right ventricular systolic function is normal. The right ventricular  size is normal.   3. Left atrial size was mildly dilated.   4. The mitral valve is normal in structure. No  evidence of mitral valve  regurgitation. No evidence of mitral stenosis.   5. The aortic valve is normal in structure. Aortic valve regurgitation is  not visualized. No aortic stenosis is present.   6. The inferior vena cava is dilated in size with >50% respiratory   variability, suggesting right atrial pressure of 8 mmHg.    Risk Assessment/Calculations  CHA2DS2-VASc Score = 6  This indicates a 9.7% annual risk of stroke. The patient's score is based upon: CHF History: 0 HTN History: 1 Diabetes History: 0 Stroke History: 2 Vascular Disease History: 1 Age Score: 2 Gender Score: 0       Physical Exam VS:  BP 114/84   Pulse 60   Ht 6' (1.829 m)   Wt 214 lb (97.1 kg)   SpO2 96%   BMI 29.02 kg/m        Wt Readings from Last 3 Encounters:  11/17/24 214 lb (97.1 kg)  10/19/24 220 lb (99.8 kg)  07/30/24 219 lb 8 oz (99.6 kg)    GEN: Well nourished, well developed in no acute distress NECK: No JVD; No carotid bruits CARDIAC: RRR, no murmurs, rubs, gallops RESPIRATORY:  Clear to auscultation without rales, wheezing or rhonchi  ABDOMEN: Soft, non-tender, non-distended EXTREMITIES:  No edema; No deformity   ASSESSMENT AND PLAN  Coronary artery disease Nonobstructive coronary artery disease with a calcium  score of 1143. Mildly elevated troponin, not indicative of myocardial infarction. Differential includes progression of coronary disease or pericarditis. - Ordered coronary CT angiography to assess for progression. - Consider FFR testing if CT shows progression. - Consider echocardiogram if CT is unchanged.  Essential hypertension Blood pressure is well-controlled. -continue current medications  Pure hypercholesterolemia Cholesterol levels are well-controlled with current medication regimen. - Continue current cholesterol management regimen.     Dispo: He can follow-up in 6 weeks after testing  Signed, Orren LOISE Fabry, PA-C   "

## 2024-11-17 ENCOUNTER — Encounter: Payer: Self-pay | Admitting: Physician Assistant

## 2024-11-17 ENCOUNTER — Ambulatory Visit: Attending: Physician Assistant | Admitting: Physician Assistant

## 2024-11-17 VITALS — BP 114/84 | HR 60 | Ht 72.0 in | Wt 214.0 lb

## 2024-11-17 DIAGNOSIS — I10 Essential (primary) hypertension: Secondary | ICD-10-CM | POA: Diagnosis not present

## 2024-11-17 DIAGNOSIS — E7800 Pure hypercholesterolemia, unspecified: Secondary | ICD-10-CM | POA: Insufficient documentation

## 2024-11-17 DIAGNOSIS — I480 Paroxysmal atrial fibrillation: Secondary | ICD-10-CM | POA: Insufficient documentation

## 2024-11-17 DIAGNOSIS — R072 Precordial pain: Secondary | ICD-10-CM | POA: Insufficient documentation

## 2024-11-17 DIAGNOSIS — I2510 Atherosclerotic heart disease of native coronary artery without angina pectoris: Secondary | ICD-10-CM | POA: Diagnosis not present

## 2024-11-17 NOTE — Patient Instructions (Addendum)
 Medication Instructions:  Your physician recommends that you continue on your current medications as directed. Please refer to the Current Medication list given to you today. *If you need a refill on your cardiac medications before your next appointment, please call your pharmacy*  Lab Work: None ordered If you have labs (blood work) drawn today and your tests are completely normal, you will receive your results only by: MyChart Message (if you have MyChart) OR A paper copy in the mail If you have any lab test that is abnormal or we need to change your treatment, we will call you to review the results.  Testing/Procedures: Coronary CTA   Your physician has requested that you have an echocardiogram. Echocardiography is a painless test that uses sound waves to create images of your heart. It provides your doctor with information about the size and shape of your heart and how well your hearts chambers and valves are working. This procedure takes approximately one hour. There are no restrictions for this procedure. Please do NOT wear cologne, perfume, aftershave, or lotions (deodorant is allowed). Please arrive 15 minutes prior to your appointment time.  Please note: We ask at that you not bring children with you during ultrasound (echo/ vascular) testing. Due to room size and safety concerns, children are not allowed in the ultrasound rooms during exams. Our front office staff cannot provide observation of children in our lobby area while testing is being conducted. An adult accompanying a patient to their appointment will only be allowed in the ultrasound room at the discretion of the ultrasound technician under special circumstances. We apologize for any inconvenience.  Follow-Up: At [facility], you and your health needs are our priority.  As part of our continuing mission to provide you with exceptional heart care, our providers are all part of one team.  This team includes your  primary Cardiologist (physician) and Advanced Practice Providers or APPs (Physician Assistants and Nurse Practitioners) who all work together to provide you with the care you need, when you need it.  Your next appointment:   6 week(s)  Provider:   Orren Fabry, PA   We recommend signing up for the patient portal called MyChart.  Sign up information is provided on this After Visit Summary.  MyChart is used to connect with patients for Virtual Visits (Telemedicine).  Patients are able to view lab/test results, encounter notes, upcoming appointments, etc.  Non-urgent messages can be sent to your provider as well.   To learn more about what you can do with MyChart, go to [email or web address].   Other Instructions ELEVATE YOU LEGS WHENEVER YOUR SITTING      Your cardiac CT will be scheduled at one of the below locations:   [facility] [address]  [place], [place] [address]   If scheduled at the [facility] at [address], please enter the parking lot using the [address] entrance and use the FREE valet service at the patient drop-off area. Enter the building and check-in with registration on the main floor.  Please follow these instructions carefully (unless otherwise directed):  An IV will be required for this test and Nitroglycerin  will be given.  Hold all erectile dysfunction medications at least 3 days (72 hrs) prior to test. (Ie viagra, cialis , sildenafil, tadalafil , etc)   On the Night Before the Test: Be sure to Drink plenty of water . Do not consume any caffeinated/decaffeinated beverages or chocolate 12 hours prior to your test.  Do not take any antihistamines 12 hours prior to your test.  On the Day of the Test: Drink plenty of water  until 1 hour prior to the test. Do not eat any food 1 hour prior to test. You may take your regular medications prior to the test.  Take metoprolol  (Lopressor ) two hours prior to  test. If you take Furosemide/Hydrochlorothiazide /Spironolactone/Chlorthalidone, please HOLD on the morning of the test. Patients who wear a continuous glucose monitor MUST remove the device prior to scanning. FEMALES- please wear underwire-free bra if available, avoid dresses & tight clothing      After the Test: Drink plenty of water . After receiving IV contrast, you may experience a mild flushed feeling. This is normal. On occasion, you may experience a mild rash up to 24 hours after the test. This is not dangerous. If this occurs, you can take Benadryl  25 mg, Zyrtec, Claritin , or Allegra and increase your fluid intake. (Patients taking Tikosyn should avoid Benadryl , and may take Zyrtec, Claritin , or Allegra) If you experience trouble breathing, this can be serious. If it is severe call 911 IMMEDIATELY. If it is mild, please call our office.  We will call to schedule your test 2-4 weeks out understanding that some insurance companies will need an authorization prior to the service being performed.   For more information and frequently asked questions, please visit our website : [email or web address]  For non-scheduling related questions, please contact the cardiac imaging nurse navigator should you have any questions/concerns: Cardiac Imaging Nurse Navigators Direct Office Dial: [phone]   For scheduling needs, including cancellations and rescheduling, please call Brittany, [phone].

## 2024-11-18 ENCOUNTER — Telehealth: Payer: Self-pay

## 2024-11-18 NOTE — Telephone Encounter (Signed)
 The patient was seen yesterday and Orren, the provider that saw the patient ordered a coronary CTA. Upon reviewing the chart I confirmed with Orren if the patient needs to take metoprolol  prior to his ccta or increase Labetalol .   Spoke with Orren who suggested the patient take 1 whole tablet of Labetalol  two hour prior to his test and resume have a dose in the evening.   Contacted the patient wife to give her the instructions but she states they are at a doctors appointment and she will give the office a call back later today.

## 2024-11-20 NOTE — Telephone Encounter (Signed)
 Spoke with the patients wife and gave her labetalol  instructions.   She voiced understanding and thanked me for calling.

## 2024-11-30 ENCOUNTER — Telehealth (HOSPITAL_COMMUNITY): Payer: Self-pay | Admitting: Emergency Medicine

## 2024-11-30 NOTE — Telephone Encounter (Signed)
 Reaching out to patient to offer assistance regarding upcoming cardiac imaging study; pt verbalizes understanding of appt date/time, parking situation and where to check in, pre-test NPO status and medications ordered, and verified current allergies; name and call back number provided for further questions should they arise Rockwell Alexandria RN Navigator Cardiac Imaging [facility] [phone] office [phone] cell

## 2024-12-01 ENCOUNTER — Other Ambulatory Visit: Payer: Self-pay | Admitting: Cardiology

## 2024-12-01 ENCOUNTER — Ambulatory Visit (HOSPITAL_BASED_OUTPATIENT_CLINIC_OR_DEPARTMENT_OTHER)
Admission: RE | Admit: 2024-12-01 | Discharge: 2024-12-01 | Disposition: A | Source: Ambulatory Visit | Attending: Cardiology | Admitting: Cardiology

## 2024-12-01 ENCOUNTER — Ambulatory Visit (HOSPITAL_BASED_OUTPATIENT_CLINIC_OR_DEPARTMENT_OTHER)
Admission: RE | Admit: 2024-12-01 | Discharge: 2024-12-01 | Disposition: A | Source: Ambulatory Visit | Attending: Physician Assistant | Admitting: Physician Assistant

## 2024-12-01 DIAGNOSIS — I2510 Atherosclerotic heart disease of native coronary artery without angina pectoris: Secondary | ICD-10-CM

## 2024-12-01 DIAGNOSIS — R931 Abnormal findings on diagnostic imaging of heart and coronary circulation: Secondary | ICD-10-CM

## 2024-12-01 DIAGNOSIS — I10 Essential (primary) hypertension: Secondary | ICD-10-CM

## 2024-12-01 DIAGNOSIS — I480 Paroxysmal atrial fibrillation: Secondary | ICD-10-CM

## 2024-12-01 DIAGNOSIS — E7800 Pure hypercholesterolemia, unspecified: Secondary | ICD-10-CM

## 2024-12-01 DIAGNOSIS — R072 Precordial pain: Secondary | ICD-10-CM

## 2024-12-01 MED ORDER — NITROGLYCERIN 0.4 MG SL SUBL
0.8000 mg | SUBLINGUAL_TABLET | Freq: Once | SUBLINGUAL | Status: AC
  Administered 2024-12-01: 0.8 mg via SUBLINGUAL

## 2024-12-01 MED ORDER — IOHEXOL 350 MG/ML SOLN
100.0000 mL | Freq: Once | INTRAVENOUS | Status: AC | PRN
  Administered 2024-12-01: 95 mL via INTRAVENOUS

## 2024-12-01 NOTE — Progress Notes (Signed)
 FFR Order

## 2024-12-02 ENCOUNTER — Ambulatory Visit: Payer: Self-pay | Admitting: Physician Assistant

## 2024-12-17 ENCOUNTER — Ambulatory Visit (HOSPITAL_COMMUNITY)

## 2025-01-06 ENCOUNTER — Ambulatory Visit: Admitting: Physician Assistant

## 2025-02-02 ENCOUNTER — Ambulatory Visit: Admitting: Cardiovascular Disease

## 2025-07-13 ENCOUNTER — Ambulatory Visit: Admitting: Family Medicine
